# Patient Record
Sex: Female | Born: 1950 | Race: Black or African American | Hispanic: No | Smoking: Former smoker
Health system: Southern US, Community
[De-identification: ages and names within clinical notes are randomized; demographics above are authoritative.]

## PROBLEM LIST (undated history)

## (undated) DIAGNOSIS — E119 Type 2 diabetes mellitus without complications: Secondary | ICD-10-CM

## (undated) DIAGNOSIS — F32A Depression, unspecified: Secondary | ICD-10-CM

## (undated) DIAGNOSIS — R7303 Prediabetes: Secondary | ICD-10-CM

## (undated) DIAGNOSIS — K219 Gastro-esophageal reflux disease without esophagitis: Secondary | ICD-10-CM

## (undated) DIAGNOSIS — I10 Essential (primary) hypertension: Secondary | ICD-10-CM

## (undated) DIAGNOSIS — S83249A Other tear of medial meniscus, current injury, unspecified knee, initial encounter: Secondary | ICD-10-CM

## (undated) DIAGNOSIS — E669 Obesity, unspecified: Secondary | ICD-10-CM

## (undated) DIAGNOSIS — J45909 Unspecified asthma, uncomplicated: Secondary | ICD-10-CM

## (undated) DIAGNOSIS — K429 Umbilical hernia without obstruction or gangrene: Secondary | ICD-10-CM

## (undated) DIAGNOSIS — C801 Malignant (primary) neoplasm, unspecified: Secondary | ICD-10-CM

## (undated) DIAGNOSIS — E785 Hyperlipidemia, unspecified: Secondary | ICD-10-CM

## (undated) DIAGNOSIS — M545 Low back pain, unspecified: Secondary | ICD-10-CM

## (undated) DIAGNOSIS — R300 Dysuria: Secondary | ICD-10-CM

## (undated) DIAGNOSIS — M21379 Foot drop, unspecified foot: Secondary | ICD-10-CM

## (undated) DIAGNOSIS — M47812 Spondylosis without myelopathy or radiculopathy, cervical region: Secondary | ICD-10-CM

## (undated) DIAGNOSIS — F329 Major depressive disorder, single episode, unspecified: Secondary | ICD-10-CM

## (undated) DIAGNOSIS — J449 Chronic obstructive pulmonary disease, unspecified: Secondary | ICD-10-CM

## (undated) DIAGNOSIS — F419 Anxiety disorder, unspecified: Secondary | ICD-10-CM

## (undated) DIAGNOSIS — R011 Cardiac murmur, unspecified: Secondary | ICD-10-CM

## (undated) HISTORY — DX: Hyperlipidemia, unspecified: E78.5

## (undated) HISTORY — DX: Major depressive disorder, single episode, unspecified: F32.9

## (undated) HISTORY — PX: CHOLECYSTECTOMY: SHX55

## (undated) HISTORY — PX: ABDOMINAL HYSTERECTOMY: SHX81

## (undated) HISTORY — DX: Low back pain: M54.5

## (undated) HISTORY — DX: Spondylosis without myelopathy or radiculopathy, cervical region: M47.812

## (undated) HISTORY — DX: Foot drop, unspecified foot: M21.379

## (undated) HISTORY — PX: FOOT SURGERY: SHX648

## (undated) HISTORY — DX: Type 2 diabetes mellitus without complications: E11.9

## (undated) HISTORY — DX: Umbilical hernia without obstruction or gangrene: K42.9

## (undated) HISTORY — DX: Low back pain, unspecified: M54.50

## (undated) HISTORY — DX: Obesity, unspecified: E66.9

## (undated) HISTORY — DX: Dysuria: R30.0

## (undated) HISTORY — PX: COLONOSCOPY: SHX174

## (undated) HISTORY — DX: Depression, unspecified: F32.A

## (undated) HISTORY — DX: Essential (primary) hypertension: I10

## (undated) MED FILL — Dexamethasone Sodium Phosphate Inj 100 MG/10ML: INTRAMUSCULAR | Qty: 1 | Status: AC

---

## 1997-01-16 HISTORY — PX: HERNIA REPAIR: SHX51

## 1999-07-21 ENCOUNTER — Ambulatory Visit (HOSPITAL_COMMUNITY): Admission: RE | Admit: 1999-07-21 | Discharge: 1999-07-21 | Payer: Self-pay

## 1999-08-16 ENCOUNTER — Encounter: Admission: RE | Admit: 1999-08-16 | Discharge: 1999-10-20 | Payer: Self-pay | Admitting: Orthopedic Surgery

## 1999-10-25 ENCOUNTER — Encounter: Admission: RE | Admit: 1999-10-25 | Discharge: 1999-12-26 | Payer: Self-pay | Admitting: Orthopedic Surgery

## 2002-10-24 ENCOUNTER — Encounter: Admission: RE | Admit: 2002-10-24 | Discharge: 2002-10-24 | Payer: Self-pay | Admitting: Internal Medicine

## 2002-10-31 ENCOUNTER — Encounter: Admission: RE | Admit: 2002-10-31 | Discharge: 2002-10-31 | Payer: Self-pay | Admitting: Internal Medicine

## 2002-12-05 ENCOUNTER — Encounter: Admission: RE | Admit: 2002-12-05 | Discharge: 2002-12-05 | Payer: Self-pay | Admitting: Internal Medicine

## 2003-01-15 ENCOUNTER — Encounter: Admission: RE | Admit: 2003-01-15 | Discharge: 2003-01-15 | Payer: Self-pay | Admitting: Internal Medicine

## 2003-01-30 ENCOUNTER — Encounter: Admission: RE | Admit: 2003-01-30 | Discharge: 2003-01-30 | Payer: Self-pay | Admitting: Internal Medicine

## 2003-06-26 ENCOUNTER — Encounter: Admission: RE | Admit: 2003-06-26 | Discharge: 2003-06-26 | Payer: Self-pay | Admitting: Internal Medicine

## 2003-10-30 ENCOUNTER — Ambulatory Visit: Payer: Self-pay | Admitting: Internal Medicine

## 2004-04-22 ENCOUNTER — Ambulatory Visit: Payer: Self-pay | Admitting: Internal Medicine

## 2004-05-19 ENCOUNTER — Ambulatory Visit: Payer: Self-pay | Admitting: Internal Medicine

## 2004-07-16 ENCOUNTER — Emergency Department (HOSPITAL_COMMUNITY): Admission: EM | Admit: 2004-07-16 | Discharge: 2004-07-16 | Payer: Self-pay | Admitting: Emergency Medicine

## 2004-12-05 ENCOUNTER — Ambulatory Visit: Payer: Self-pay | Admitting: Internal Medicine

## 2005-03-08 ENCOUNTER — Ambulatory Visit: Payer: Self-pay | Admitting: Internal Medicine

## 2005-06-16 ENCOUNTER — Ambulatory Visit (HOSPITAL_COMMUNITY): Admission: RE | Admit: 2005-06-16 | Discharge: 2005-06-16 | Payer: Self-pay | Admitting: *Deleted

## 2005-09-21 ENCOUNTER — Ambulatory Visit: Payer: Self-pay | Admitting: Hospitalist

## 2005-09-24 ENCOUNTER — Emergency Department (HOSPITAL_COMMUNITY): Admission: EM | Admit: 2005-09-24 | Discharge: 2005-09-24 | Payer: Self-pay | Admitting: Emergency Medicine

## 2005-12-06 DIAGNOSIS — K5900 Constipation, unspecified: Secondary | ICD-10-CM | POA: Insufficient documentation

## 2005-12-06 DIAGNOSIS — M216X9 Other acquired deformities of unspecified foot: Secondary | ICD-10-CM | POA: Insufficient documentation

## 2005-12-06 DIAGNOSIS — I10 Essential (primary) hypertension: Secondary | ICD-10-CM | POA: Insufficient documentation

## 2005-12-06 DIAGNOSIS — M545 Low back pain, unspecified: Secondary | ICD-10-CM | POA: Insufficient documentation

## 2005-12-06 DIAGNOSIS — H6690 Otitis media, unspecified, unspecified ear: Secondary | ICD-10-CM | POA: Insufficient documentation

## 2005-12-06 DIAGNOSIS — E66812 Obesity, class 2: Secondary | ICD-10-CM | POA: Insufficient documentation

## 2005-12-06 DIAGNOSIS — K469 Unspecified abdominal hernia without obstruction or gangrene: Secondary | ICD-10-CM | POA: Insufficient documentation

## 2005-12-06 DIAGNOSIS — M47812 Spondylosis without myelopathy or radiculopathy, cervical region: Secondary | ICD-10-CM | POA: Insufficient documentation

## 2005-12-06 DIAGNOSIS — E669 Obesity, unspecified: Secondary | ICD-10-CM | POA: Insufficient documentation

## 2005-12-06 DIAGNOSIS — F325 Major depressive disorder, single episode, in full remission: Secondary | ICD-10-CM | POA: Insufficient documentation

## 2005-12-06 DIAGNOSIS — M549 Dorsalgia, unspecified: Secondary | ICD-10-CM | POA: Insufficient documentation

## 2005-12-06 DIAGNOSIS — E785 Hyperlipidemia, unspecified: Secondary | ICD-10-CM | POA: Insufficient documentation

## 2005-12-06 DIAGNOSIS — E119 Type 2 diabetes mellitus without complications: Secondary | ICD-10-CM | POA: Insufficient documentation

## 2006-02-16 ENCOUNTER — Ambulatory Visit: Payer: Self-pay | Admitting: Internal Medicine

## 2006-02-16 LAB — CONVERTED CEMR LAB
Glucose, Bld: 98 mg/dL
Hgb A1c MFr Bld: 6 %

## 2006-02-21 ENCOUNTER — Emergency Department (HOSPITAL_COMMUNITY): Admission: EM | Admit: 2006-02-21 | Discharge: 2006-02-21 | Payer: Self-pay | Admitting: Emergency Medicine

## 2006-02-26 ENCOUNTER — Encounter (INDEPENDENT_AMBULATORY_CARE_PROVIDER_SITE_OTHER): Payer: Self-pay | Admitting: *Deleted

## 2006-02-26 ENCOUNTER — Ambulatory Visit: Payer: Self-pay | Admitting: Hospitalist

## 2006-03-05 ENCOUNTER — Ambulatory Visit (HOSPITAL_COMMUNITY): Admission: RE | Admit: 2006-03-05 | Discharge: 2006-03-05 | Payer: Self-pay | Admitting: *Deleted

## 2006-03-05 ENCOUNTER — Ambulatory Visit: Payer: Self-pay | Admitting: Hospitalist

## 2006-03-07 ENCOUNTER — Telehealth: Payer: Self-pay | Admitting: *Deleted

## 2006-03-15 ENCOUNTER — Encounter: Admission: RE | Admit: 2006-03-15 | Discharge: 2006-05-09 | Payer: Self-pay | Admitting: Hospitalist

## 2006-03-16 ENCOUNTER — Telehealth: Payer: Self-pay | Admitting: *Deleted

## 2006-03-19 ENCOUNTER — Encounter (INDEPENDENT_AMBULATORY_CARE_PROVIDER_SITE_OTHER): Payer: Self-pay | Admitting: *Deleted

## 2006-03-20 ENCOUNTER — Telehealth: Payer: Self-pay | Admitting: *Deleted

## 2006-03-23 ENCOUNTER — Encounter (INDEPENDENT_AMBULATORY_CARE_PROVIDER_SITE_OTHER): Payer: Self-pay | Admitting: *Deleted

## 2006-03-23 ENCOUNTER — Ambulatory Visit: Payer: Self-pay | Admitting: Internal Medicine

## 2006-03-23 LAB — CONVERTED CEMR LAB
Cholesterol: 155 mg/dL (ref 0–200)
HDL: 48 mg/dL (ref >39)
LDL Cholesterol: 93 mg/dL (ref 0–99)
Total CHOL/HDL Ratio: 3.2
Triglycerides: 68 mg/dL (ref <150)
VLDL: 14 mg/dL (ref 0–40)

## 2006-04-03 ENCOUNTER — Telehealth: Payer: Self-pay | Admitting: *Deleted

## 2006-04-16 ENCOUNTER — Telehealth: Payer: Self-pay | Admitting: *Deleted

## 2006-04-19 ENCOUNTER — Telehealth: Payer: Self-pay | Admitting: *Deleted

## 2006-04-27 ENCOUNTER — Ambulatory Visit: Payer: Self-pay | Admitting: Internal Medicine

## 2006-04-27 ENCOUNTER — Encounter (INDEPENDENT_AMBULATORY_CARE_PROVIDER_SITE_OTHER): Payer: Self-pay | Admitting: *Deleted

## 2006-04-27 LAB — CONVERTED CEMR LAB
ALT: 20 units/L (ref 0–35)
AST: 23 units/L (ref 0–37)
Albumin: 4.2 g/dL (ref 3.5–5.2)
Alkaline Phosphatase: 85 units/L (ref 39–117)
BUN: 18 mg/dL (ref 6–23)
CO2: 27 meq/L (ref 19–32)
Calcium: 9.8 mg/dL (ref 8.4–10.5)
Chloride: 101 meq/L (ref 96–112)
Creatinine, Ser: 1 mg/dL (ref 0.40–1.20)
Glucose, Bld: 104 mg/dL — ABNORMAL HIGH (ref 70–99)
Potassium: 4.2 meq/L (ref 3.5–5.3)
Sodium: 141 meq/L (ref 135–145)
Total Bilirubin: 0.4 mg/dL (ref 0.3–1.2)
Total Protein: 7.6 g/dL (ref 6.0–8.3)

## 2006-05-10 ENCOUNTER — Telehealth: Payer: Self-pay | Admitting: *Deleted

## 2006-06-04 ENCOUNTER — Telehealth (INDEPENDENT_AMBULATORY_CARE_PROVIDER_SITE_OTHER): Payer: Self-pay | Admitting: *Deleted

## 2006-07-30 ENCOUNTER — Telehealth (INDEPENDENT_AMBULATORY_CARE_PROVIDER_SITE_OTHER): Payer: Self-pay | Admitting: *Deleted

## 2006-07-31 ENCOUNTER — Telehealth (INDEPENDENT_AMBULATORY_CARE_PROVIDER_SITE_OTHER): Payer: Self-pay | Admitting: *Deleted

## 2006-08-31 ENCOUNTER — Encounter (INDEPENDENT_AMBULATORY_CARE_PROVIDER_SITE_OTHER): Payer: Self-pay | Admitting: *Deleted

## 2006-08-31 ENCOUNTER — Ambulatory Visit: Payer: Self-pay | Admitting: Infectious Disease

## 2006-08-31 LAB — CONVERTED CEMR LAB
ALT: 11 units/L (ref 0–35)
AST: 16 units/L (ref 0–37)
Albumin: 4.3 g/dL (ref 3.5–5.2)
Alkaline Phosphatase: 72 units/L (ref 39–117)
BUN: 11 mg/dL (ref 6–23)
Blood Glucose, Fingerstick: 87
CO2: 27 meq/L (ref 19–32)
Calcium: 10.2 mg/dL (ref 8.4–10.5)
Chloride: 105 meq/L (ref 96–112)
Creatinine, Ser: 0.86 mg/dL (ref 0.40–1.20)
Glucose, Bld: 94 mg/dL (ref 70–99)
Hgb A1c MFr Bld: 6 %
Potassium: 4.3 meq/L (ref 3.5–5.3)
Sodium: 146 meq/L — ABNORMAL HIGH (ref 135–145)
Total Bilirubin: 0.5 mg/dL (ref 0.3–1.2)
Total Protein: 7.4 g/dL (ref 6.0–8.3)
Valproic Acid Lvl: 54.8 ug/mL (ref 50.0–100.0)

## 2006-10-02 ENCOUNTER — Telehealth: Payer: Self-pay | Admitting: *Deleted

## 2006-10-04 ENCOUNTER — Encounter (INDEPENDENT_AMBULATORY_CARE_PROVIDER_SITE_OTHER): Payer: Self-pay | Admitting: *Deleted

## 2006-11-12 ENCOUNTER — Telehealth: Payer: Self-pay | Admitting: *Deleted

## 2007-01-16 ENCOUNTER — Encounter (INDEPENDENT_AMBULATORY_CARE_PROVIDER_SITE_OTHER): Payer: Self-pay | Admitting: *Deleted

## 2007-01-16 ENCOUNTER — Ambulatory Visit: Payer: Self-pay | Admitting: Hospitalist

## 2007-01-16 LAB — CONVERTED CEMR LAB
Blood Glucose, Fingerstick: 124
Hgb A1c MFr Bld: 6.2 %

## 2007-01-18 LAB — CONVERTED CEMR LAB
Cholesterol: 153 mg/dL (ref 0–200)
HDL: 48 mg/dL (ref >39)
LDL Cholesterol: 92 mg/dL (ref 0–99)
Total CHOL/HDL Ratio: 3.2
Triglycerides: 63 mg/dL (ref <150)
VLDL: 13 mg/dL (ref 0–40)

## 2007-03-15 ENCOUNTER — Ambulatory Visit: Payer: Self-pay | Admitting: Internal Medicine

## 2007-03-29 ENCOUNTER — Encounter: Payer: Self-pay | Admitting: Internal Medicine

## 2007-03-29 ENCOUNTER — Ambulatory Visit: Payer: Self-pay | Admitting: Internal Medicine

## 2007-03-29 ENCOUNTER — Encounter (INDEPENDENT_AMBULATORY_CARE_PROVIDER_SITE_OTHER): Payer: Self-pay | Admitting: *Deleted

## 2007-03-29 LAB — HM COLONOSCOPY

## 2007-06-11 ENCOUNTER — Encounter: Admission: RE | Admit: 2007-06-11 | Discharge: 2007-06-11 | Payer: Self-pay | Admitting: Sports Medicine

## 2007-06-21 ENCOUNTER — Ambulatory Visit: Payer: Self-pay | Admitting: Internal Medicine

## 2007-06-21 LAB — CONVERTED CEMR LAB
Blood Glucose, Fingerstick: 86
Hgb A1c MFr Bld: 6.4 %

## 2007-09-04 ENCOUNTER — Telehealth (INDEPENDENT_AMBULATORY_CARE_PROVIDER_SITE_OTHER): Payer: Self-pay | Admitting: *Deleted

## 2007-09-09 ENCOUNTER — Encounter (INDEPENDENT_AMBULATORY_CARE_PROVIDER_SITE_OTHER): Payer: Self-pay | Admitting: *Deleted

## 2007-09-21 ENCOUNTER — Emergency Department (HOSPITAL_COMMUNITY): Admission: EM | Admit: 2007-09-21 | Discharge: 2007-09-21 | Payer: Self-pay | Admitting: Emergency Medicine

## 2007-09-23 ENCOUNTER — Encounter (INDEPENDENT_AMBULATORY_CARE_PROVIDER_SITE_OTHER): Payer: Self-pay | Admitting: *Deleted

## 2007-09-24 ENCOUNTER — Ambulatory Visit: Payer: Self-pay | Admitting: *Deleted

## 2007-10-18 ENCOUNTER — Encounter: Admission: RE | Admit: 2007-10-18 | Discharge: 2007-10-18 | Payer: Self-pay | Admitting: Orthopaedic Surgery

## 2007-10-31 ENCOUNTER — Encounter (INDEPENDENT_AMBULATORY_CARE_PROVIDER_SITE_OTHER): Payer: Self-pay | Admitting: *Deleted

## 2008-02-10 ENCOUNTER — Ambulatory Visit: Payer: Self-pay | Admitting: Infectious Disease

## 2008-02-10 ENCOUNTER — Encounter (INDEPENDENT_AMBULATORY_CARE_PROVIDER_SITE_OTHER): Payer: Self-pay | Admitting: *Deleted

## 2008-02-10 LAB — CONVERTED CEMR LAB
ALT: 12 units/L (ref 0–35)
AST: 18 units/L (ref 0–37)
Albumin: 4.1 g/dL (ref 3.5–5.2)
Alkaline Phosphatase: 65 units/L (ref 39–117)
BUN: 15 mg/dL (ref 6–23)
Blood Glucose, Fingerstick: 82
CO2: 25 meq/L (ref 19–32)
Calcium: 9.7 mg/dL (ref 8.4–10.5)
Chloride: 104 meq/L (ref 96–112)
Cholesterol: 147 mg/dL (ref 0–200)
Creatinine, Ser: 0.84 mg/dL (ref 0.40–1.20)
Glucose, Bld: 84 mg/dL (ref 70–99)
HDL: 47 mg/dL (ref >39)
LDL Cholesterol: 88 mg/dL (ref 0–99)
Potassium: 4.1 meq/L (ref 3.5–5.3)
Sodium: 142 meq/L (ref 135–145)
Total Bilirubin: 0.3 mg/dL (ref 0.3–1.2)
Total CHOL/HDL Ratio: 3.1
Total Protein: 7.1 g/dL (ref 6.0–8.3)
Triglycerides: 60 mg/dL (ref <150)
VLDL: 12 mg/dL (ref 0–40)

## 2008-02-26 ENCOUNTER — Encounter (INDEPENDENT_AMBULATORY_CARE_PROVIDER_SITE_OTHER): Payer: Self-pay | Admitting: *Deleted

## 2008-03-05 LAB — HM MAMMOGRAPHY

## 2008-04-25 ENCOUNTER — Emergency Department (HOSPITAL_COMMUNITY): Admission: EM | Admit: 2008-04-25 | Discharge: 2008-04-25 | Payer: Self-pay | Admitting: Emergency Medicine

## 2008-05-01 ENCOUNTER — Ambulatory Visit: Payer: Self-pay | Admitting: Internal Medicine

## 2008-05-01 DIAGNOSIS — J309 Allergic rhinitis, unspecified: Secondary | ICD-10-CM | POA: Insufficient documentation

## 2008-05-10 ENCOUNTER — Emergency Department (HOSPITAL_COMMUNITY): Admission: EM | Admit: 2008-05-10 | Discharge: 2008-05-10 | Payer: Self-pay | Admitting: Emergency Medicine

## 2008-05-12 ENCOUNTER — Ambulatory Visit: Payer: Self-pay | Admitting: Internal Medicine

## 2008-05-12 ENCOUNTER — Encounter (INDEPENDENT_AMBULATORY_CARE_PROVIDER_SITE_OTHER): Payer: Self-pay | Admitting: Internal Medicine

## 2008-05-12 ENCOUNTER — Encounter (INDEPENDENT_AMBULATORY_CARE_PROVIDER_SITE_OTHER): Payer: Self-pay | Admitting: *Deleted

## 2008-05-12 ENCOUNTER — Ambulatory Visit (HOSPITAL_COMMUNITY): Admission: RE | Admit: 2008-05-12 | Discharge: 2008-05-12 | Payer: Self-pay | Admitting: Internal Medicine

## 2008-05-12 DIAGNOSIS — R5383 Other fatigue: Secondary | ICD-10-CM | POA: Insufficient documentation

## 2008-05-12 DIAGNOSIS — R5381 Other malaise: Secondary | ICD-10-CM | POA: Insufficient documentation

## 2008-05-12 LAB — CONVERTED CEMR LAB
Blood Glucose, Fingerstick: 154
Hgb A1c MFr Bld: 6.3 %

## 2008-06-30 ENCOUNTER — Ambulatory Visit: Payer: Self-pay | Admitting: Internal Medicine

## 2008-06-30 DIAGNOSIS — M1990 Unspecified osteoarthritis, unspecified site: Secondary | ICD-10-CM | POA: Insufficient documentation

## 2008-07-03 ENCOUNTER — Ambulatory Visit (HOSPITAL_COMMUNITY): Admission: RE | Admit: 2008-07-03 | Discharge: 2008-07-03 | Payer: Self-pay | Admitting: Internal Medicine

## 2008-09-18 ENCOUNTER — Ambulatory Visit: Payer: Self-pay | Admitting: Internal Medicine

## 2008-09-18 LAB — CONVERTED CEMR LAB
ALT: 15 units/L (ref 0–35)
AST: 19 units/L (ref 0–37)
Albumin: 4.1 g/dL (ref 3.5–5.2)
Alkaline Phosphatase: 70 units/L (ref 39–117)
BUN: 18 mg/dL (ref 6–23)
Blood Glucose, Fingerstick: 98
Blood in Urine, dipstick: NEGATIVE
CO2: 26 meq/L (ref 19–32)
Calcium: 9.6 mg/dL (ref 8.4–10.5)
Chloride: 104 meq/L (ref 96–112)
Creatinine, Ser: 1.13 mg/dL (ref 0.40–1.20)
Creatinine, Urine: 189.3 mg/dL
Glucose, Bld: 95 mg/dL (ref 70–99)
Glucose, Urine, Semiquant: NEGATIVE
Hgb A1c MFr Bld: 6.3 %
Ketones, urine, test strip: NEGATIVE
Microalb Creat Ratio: 4.8 mg/g (ref 0.0–30.0)
Microalb, Ur: 0.91 mg/dL (ref 0.00–1.89)
Nitrite: NEGATIVE
Potassium: 3.8 meq/L (ref 3.5–5.3)
Protein, U semiquant: NEGATIVE
Sodium: 142 meq/L (ref 135–145)
Specific Gravity, Urine: 1.025
Total Bilirubin: 0.4 mg/dL (ref 0.3–1.2)
Total Protein: 7.1 g/dL (ref 6.0–8.3)
Urobilinogen, UA: 1
WBC Urine, dipstick: NEGATIVE
pH: 6.5

## 2008-09-30 ENCOUNTER — Ambulatory Visit: Payer: Self-pay | Admitting: Sports Medicine

## 2008-09-30 DIAGNOSIS — M25569 Pain in unspecified knee: Secondary | ICD-10-CM | POA: Insufficient documentation

## 2009-01-18 ENCOUNTER — Telehealth (INDEPENDENT_AMBULATORY_CARE_PROVIDER_SITE_OTHER): Payer: Self-pay | Admitting: *Deleted

## 2009-02-24 ENCOUNTER — Telehealth: Payer: Self-pay | Admitting: Internal Medicine

## 2009-03-09 ENCOUNTER — Ambulatory Visit: Payer: Self-pay | Admitting: Internal Medicine

## 2009-03-09 DIAGNOSIS — N3090 Cystitis, unspecified without hematuria: Secondary | ICD-10-CM | POA: Insufficient documentation

## 2009-03-09 LAB — HM DIABETES FOOT EXAM

## 2009-03-09 LAB — CONVERTED CEMR LAB
ALT: 8 units/L (ref 0–35)
AST: 15 units/L (ref 0–37)
Albumin: 4.2 g/dL (ref 3.5–5.2)
Alkaline Phosphatase: 74 units/L (ref 39–117)
BUN: 12 mg/dL (ref 6–23)
Blood Glucose, Fingerstick: 89
CO2: 29 meq/L (ref 19–32)
Calcium: 9.6 mg/dL (ref 8.4–10.5)
Chloride: 100 meq/L (ref 96–112)
Cholesterol: 150 mg/dL (ref 0–200)
Creatinine, Ser: 0.92 mg/dL (ref 0.40–1.20)
Glucose, Bld: 86 mg/dL (ref 70–99)
HDL: 52 mg/dL (ref >39)
Hgb A1c MFr Bld: 6.1 %
LDL Cholesterol: 88 mg/dL (ref 0–99)
Potassium: 4.1 meq/L (ref 3.5–5.3)
Sodium: 139 meq/L (ref 135–145)
Total Bilirubin: 0.4 mg/dL (ref 0.3–1.2)
Total CHOL/HDL Ratio: 2.9
Total Protein: 7.4 g/dL (ref 6.0–8.3)
Triglycerides: 50 mg/dL (ref <150)
VLDL: 10 mg/dL (ref 0–40)

## 2009-03-31 ENCOUNTER — Encounter: Payer: Self-pay | Admitting: Internal Medicine

## 2009-10-22 ENCOUNTER — Ambulatory Visit: Payer: Self-pay | Admitting: Internal Medicine

## 2009-10-22 LAB — CONVERTED CEMR LAB
ALT: 13 units/L (ref 0–35)
AST: 17 units/L (ref 0–37)
Albumin: 4.4 g/dL (ref 3.5–5.2)
Alkaline Phosphatase: 86 units/L (ref 39–117)
BUN: 19 mg/dL (ref 6–23)
Blood Glucose, AC Bkfst: 111 mg/dL
CO2: 29 meq/L (ref 19–32)
Calcium: 9.8 mg/dL (ref 8.4–10.5)
Chloride: 99 meq/L (ref 96–112)
Cholesterol: 168 mg/dL (ref 0–200)
Creatinine, Ser: 0.98 mg/dL (ref 0.40–1.20)
Glucose, Bld: 109 mg/dL — ABNORMAL HIGH (ref 70–99)
HCT: 40.5 % (ref 36.0–46.0)
HDL: 48 mg/dL (ref >39)
Hemoglobin: 13.2 g/dL (ref 12.0–15.0)
Hgb A1c MFr Bld: 6.5 %
LDL Cholesterol: 109 mg/dL — ABNORMAL HIGH (ref 0–99)
MCHC: 32.6 g/dL (ref 30.0–36.0)
MCV: 86.7 fL (ref >=78.0)
Platelets: 307 10*3/uL (ref 150–400)
Potassium: 4.2 meq/L (ref 3.5–5.3)
RBC: 4.67 M/uL (ref 3.87–5.11)
RDW: 13 % (ref 11.5–15.5)
Sodium: 138 meq/L (ref 135–145)
TSH: 0.497 microintl units/mL (ref 0.350–4.5)
Total Bilirubin: 0.4 mg/dL (ref 0.3–1.2)
Total CHOL/HDL Ratio: 3.5
Total Protein: 7.4 g/dL (ref 6.0–8.3)
Triglycerides: 53 mg/dL (ref <150)
VLDL: 11 mg/dL (ref 0–40)
WBC: 6.5 10*3/uL (ref 4.0–10.5)

## 2009-12-27 ENCOUNTER — Ambulatory Visit: Payer: Self-pay

## 2009-12-27 ENCOUNTER — Ambulatory Visit: Payer: Self-pay | Admitting: Internal Medicine

## 2009-12-27 DIAGNOSIS — R300 Dysuria: Secondary | ICD-10-CM

## 2009-12-27 HISTORY — DX: Dysuria: R30.0

## 2009-12-27 LAB — CONVERTED CEMR LAB
Bilirubin Urine: NEGATIVE
Blood Glucose, Fingerstick: 115
Blood, UA: NEGATIVE
Ketones, ur: NEGATIVE mg/dL
Leukocytes, UA: NEGATIVE
Nitrite: NEGATIVE
Protein, ur: NEGATIVE mg/dL
Specific Gravity, Urine: 1.024 (ref 1.005–1.03)
Urine Glucose: NEGATIVE mg/dL
Urobilinogen, UA: 0.2 (ref 0.0–1.0)
pH: 5.5 (ref 5.0–8.0)

## 2010-02-03 ENCOUNTER — Ambulatory Visit: Admit: 2010-02-03 | Payer: Self-pay

## 2010-02-06 ENCOUNTER — Encounter: Payer: Self-pay | Admitting: Internal Medicine

## 2010-02-07 ENCOUNTER — Encounter: Payer: Self-pay | Admitting: Orthopaedic Surgery
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
## 2010-02-15 NOTE — Assessment & Plan Note (Signed)
Summary: EST-CK/FU/MEDS/CFB   Vital Signs:  Patient Profile:   61 Years Old Female Height:     61.75 inches (156.85 cm) Weight:      194.2 pounds (88.27 kg) BMI:     35.94 Temp:     97.6 degrees F oral Pulse rate:   71 / minute BP sitting:   110 / 73  (right arm)  Pt. in pain?   yes    Location:   left arm and sinus    Intensity:   7-10  Vitals Entered By: Mariah Kidney Ditzler RN (June 21, 2007 3:28 PM)              Is Patient Diabetic? Yes  Nutritional Status BMI of > 30 = obese Nutritional Status Detail good CBG Result 86  Have you ever been in a relationship where you felt threatened, hurt or afraid?denies   Does patient need assistance? Functional Status Self care Ambulation Normal     PCP:  Olene Craven MD  Chief Complaint:  Ck-up and refills on meds. Handicapped sticker. Recently had labs at 2 diff doctors office.Marland Kitchen  History of Present Illness: Mariah Henderson is a 59 y/o woman with DM-II, HTN, HLPD, major depressive disorder who comes to the opc for a checkup.  Is on Zyrtec but only takes it sporadically. Had a h/a and her eyes were hurting last night. Bothers her especially at that time of the day. Wonders why she still gets sx.  Otherwise, no complaints.  Depression History:      The patient denies significant weight loss, insomnia, psychomotor retardation, fatigue (loss of energy), feelings of worthlessness (guilt), impaired concentration (indecisiveness), and recurrent thoughts of death or suicide.  The patient denies symptoms of a manic disorder including persistently & abnormally elevated mood.        The patient denies that she feels like life is not worth living, denies that she wishes that she were dead, and denies that she has thought about ending her life.         Diabetes Management History:      Self foot exams are being performed.  She is not checking home blood sugars.  She says that she is exercising.        Hypoglycemic symptoms are not occurring.  No  hyperglycemic symptoms are reported.        Prior Medication List:  LOTENSIN 20 MG TABS (BENAZEPRIL HCL) Take 1 tablet by mouth once a day HYDROCHLOROTHIAZIDE 25 MG TABS (HYDROCHLOROTHIAZIDE) Take 1 tablet by mouth once a day ASPIRIN 81 MG TBEC (ASPIRIN) Take 1 tablet by mouth once a day NEURONTIN 300 MG CAPS (GABAPENTIN) Take 1 capsule by mouth at bedtime METAMUCIL  POWD (PSYLLIUM POWD) Use as directed ZOCOR 40 MG TABS (SIMVASTATIN) Take 1 tablet by mouth at bedtime GLUCOPHAGE 500 MG TABS (METFORMIN HCL) One tablet daily CLARITIN-D 24 HOUR 10-240 MG TB24 (LORATADINE-PSEUDOEPHEDRINE) Take 1 tablet by mouth once a day NASAL SALINE 0.65 % SOLN (SALINE) 2 sprays in each nostril 1 to 2 times daily PAXIL 20 MG  TABS (PAROXETINE HCL)    Current Allergies: No known allergies     Risk Factors:  Tobacco use:  quit    Year quit:  1998 Exercise:  yes   Review of Systems  General      Denies fatigue and fever.  CV      Denies chest pain or discomfort, difficulty breathing while lying down, palpitations, and swelling of feet.  Resp  Denies cough and shortness of breath.  GI      Denies abdominal pain, bloody stools, constipation, dark tarry stools, and diarrhea.   Physical Exam  General:     alert, well-developed, well-nourished, and well-hydrated. Elderly woman in NAD. Head:     atraumatic.   Eyes:     vision grossly intact, pupils equal, pupils round, and pupils reactive to light.   Mouth:     OP clear Lungs:     CTAB with good air mvtnormal respiratory effort, no intercostal retractions, and no accessory muscle use.   Heart:     RRR Abdomen:     soft, non-tender, and no masses.   Extremities:     No e/c/c. Neurologic:     alert & oriented X3, cranial nerves II-XII intact, and gait normal.      Impression & Recommendations:  Problem # 1:  HYPERTENSION (ICD-401.9) Well controlled.  Her updated medication list for this problem includes:    Lotensin 20  Mg Tabs (Benazepril hcl) .Marland Kitchen... Take 1 tablet by mouth once a day    Hydrochlorothiazide 25 Mg Tabs (Hydrochlorothiazide) .Marland Kitchen... Take 1 tablet by mouth once a day  BP today: 110/73 Prior BP: 149/88 (01/16/2007)  Prior 10 Yr Risk Heart Disease: 17 % (01/16/2007)  Labs Reviewed: Creat: 0.86 (08/31/2006) Chol: 153 (01/16/2007)   HDL: 48 (01/16/2007)   LDL: 92 (01/16/2007)   TG: 63 (01/16/2007)   Problem # 2:  DIABETES MELLITUS, TYPE II (ICD-250.00) Excellent control on current regimen.  Her updated medication list for this problem includes:    Lotensin 20 Mg Tabs (Benazepril hcl) .Marland Kitchen... Take 1 tablet by mouth once a day    Aspirin 81 Mg Tbec (Aspirin) .Marland Kitchen... Take 1 tablet by mouth once a day    Glucophage 500 Mg Tabs (Metformin hcl) ..... One tablet daily  Orders: T- Capillary Blood Glucose (91478) T-Hgb A1C (in-house) ([id number])  Labs Reviewed: HgBA1c: 6.2 (01/16/2007)   Creat: 0.86 (08/31/2006)     Labs Reviewed: HgBA1c: 6.4 (06/21/2007)   Creat: 0.86 (08/31/2006)      Problem # 3:  HYPERLIPIDEMIA (ICD-272.4) LDL at goal. No myalgias.  Her updated medication list for this problem includes:    Zocor 40 Mg Tabs (Simvastatin) .Marland Kitchen... Take 1 tablet by mouth at bedtime  Labs Reviewed: Chol: 153 (01/16/2007)   HDL: 48 (01/16/2007)   LDL: 92 (01/16/2007)   TG: 63 (01/16/2007) SGOT: 16 (08/31/2006)   SGPT: 11 (08/31/2006)  Prior 10 Yr Risk Heart Disease: 17 % (01/16/2007)   Problem # 4:  OBESITY (ICD-278.00) Encouraged patient to continue her efforts (she hadn't even noticed that she'd lost about 8 lbs since her last visit). [facility]  Problem # 5:  DEPRESSION (ICD-311) Has a f/u apptmt this month at mental health. Doing well. no sucidial/homicidal ideation.  Her updated medication list for this problem includes:    Paxil 20 Mg Tabs (Paroxetine hcl)   Problem # 6:  Preventive Health Care (ICD-V70.0) Had her colonoscopy done and had  polypectomies: benign pathology (Dr. Juanda Chance). Up to date with mammograms.  Problem # 7:  ALLERGIC RHINITIS DUE TO OTHER ALLERGEN (ICD-477.8) Advised her to take the Zyrtec DAILY if she wants the full benefits of the therapy.  Complete Medication List: 1)  Lotensin 20 Mg Tabs (Benazepril hcl) .... Take 1 tablet by mouth once a day 2)  Hydrochlorothiazide 25 Mg Tabs (Hydrochlorothiazide) .... Take 1 tablet by mouth once a day  3)  Aspirin 81 Mg Tbec (Aspirin) .... Take 1 tablet by mouth once a day 4)  Neurontin 300 Mg Caps (Gabapentin) .... Take 1 capsule by mouth at bedtime 5)  Metamucil Powd (Psyllium powd) .... Use as directed 6)  Zocor 40 Mg Tabs (Simvastatin) .... Take 1 tablet by mouth at bedtime 7)  Glucophage 500 Mg Tabs (Metformin hcl) .... One tablet daily 8)  Claritin-d 24 Hour 10-240 Mg Tb24 (Loratadine-pseudoephedrine) .... Take 1 tablet by mouth once a day 9)  Nasal Saline 0.65 % Soln (Saline) .... 2 sprays in each nostril 1 to 2 times daily 10)  Paxil 20 Mg Tabs (Paroxetine hcl)  Diabetes Management Assessment/Plan:      The following lipid goals have been established for the patient: Total cholesterol goal of 200; LDL cholesterol goal of 100; HDL cholesterol goal of 40; Triglyceride goal of 200.     Patient Instructions: 1)  Keep up the good work ! 2)  Please schedule a follow-up appointment as needed.   Prescriptions: GLUCOPHAGE 500 MG TABS (METFORMIN HCL) One tablet daily  #30 x 11   Entered and Authorized by:   Olene Craven MD   Signed by:   Olene Craven MD on 06/21/2007   Method used:   Electronically sent to ...       [facility]  [address] [phone]*       [address]       [place], [place]  [address]       Ph: [phone]       Fax: [phone]   RxID:   [id number] ZOCOR 40 MG TABS (SIMVASTATIN) Take 1 tablet by mouth at bedtime  #30 x 11   Entered and Authorized by:   Olene Craven MD   Signed by:   Olene Craven MD on 06/21/2007   Method  used:   Electronically sent to ...       [facility]  [address] [phone]*       [address]       [place], [place]  [address]       Ph: [phone]       Fax: [phone]   RxID:   [id number] HYDROCHLOROTHIAZIDE 25 MG TABS (HYDROCHLOROTHIAZIDE) Take 1 tablet by mouth once a day  #30 x 11   Entered and Authorized by:   Olene Craven MD   Signed by:   Olene Craven MD on 06/21/2007   Method used:   Electronically sent to ...       [facility]  [address] [phone]*       [address]       [place], [place]  [address]       Ph: [phone]       Fax: [phone]   RxID:   [id number] LOTENSIN 20 MG TABS (BENAZEPRIL HCL) Take 1 tablet by mouth once a day  #30 x 11   Entered and Authorized by:   Olene Craven MD   Signed by:   Olene Craven MD on 06/21/2007   Method used:   Electronically sent to ...       [address] [phone]*       [address]       [place], [place]  [address]       Ph: [phone]       Fax: [phone]   RxID:   [id number]  ]  Vital Signs:  Patient Profile:   60 Years Old Female Height:  61.75 inches (156.85 cm) Weight:      194.2 pounds (88.27 kg) BMI:     35.94 Temp:     97.6 degrees F oral Pulse rate:   71 / minute BP sitting:   110 / 73    Location:   left arm and sinus    Intensity:   7-10             CBG Result 86      Laboratory Results   Blood Tests   Date/Time Received: June 21, 2007 3:55 PM  Date/Time Reported: Oren Beckmann  June 21, 2007 3:55 PM   HGBA1C: 6.4%   (Normal Range: Non-Diabetic - 3-6%   Control Diabetic - 6-8%) CBG Random:: 86mg /dL

## 2010-02-15 NOTE — Consult Note (Signed)
Summary: [facility].: Colonoscopy  [facility].: Colonoscopy   Imported By: Florinda Marker 12/16/2007 14:21:24  _____________________________________________________________________  External Attachment:    Type:   Image     Comment:   External Document  Appended Document: [facility].: Colonoscopy    Clinical Lists Changes  Observations: Added new observation of COLONRECACT: Repeat colonoscopy in 7 years.  (03/29/2007 11:03) Added new observation of COLONOSCOPY:  Results: Several diminutive polyps in rectosigmoid.  Results: Specimen sent for pathology.    Results: Diverticulosis.   Performed by Dr. Juanda Chance.      (03/29/2007 11:03)       Colonoscopy  Procedure date:  03/29/2007  Findings:       Results: Several diminutive polyps in rectosigmoid.  Results: Specimen sent for pathology.    Results: Diverticulosis.   Performed by Dr. Juanda Chance.       Comments:      Repeat colonoscopy in 7 years.

## 2010-02-15 NOTE — Miscellaneous (Signed)
Summary: Orders Update  Clinical Lists Changes  Orders: Added new Test order of T-Lipid Profile [phone]) - Signed Added new Test order of T-Comprehensive Metabolic Panel ([id number]) - Signed Check Cmet, FLP with next visit.

## 2010-02-15 NOTE — Assessment & Plan Note (Signed)
Summary: ACUTE-DIABETES AND BP CHECK ([facility])/CFB   Vital Signs:  Patient profile:   60 year old female Height:      59.5 inches (151.13 cm) Weight:      197.9 pounds (89.95 kg) BMI:     39.44 Temp:     97.9 degrees F oral Pulse rate:   79 / minute BP sitting:   135 / 85  (left arm) Cuff size:   large  Vitals Entered By: Cynda Familia Duncan Dull) (October 22, 2009 4:24 PM) Is Patient Diabetic? Yes Nutritional Status BMI of > 30 = obese  Have you ever been in a relationship where you felt threatened, hurt or afraid?No   Does patient need assistance? Functional Status Self care Ambulation Normal   Primary Care Provider:  Darnelle Maffucci MD   History of Present Illness: 60 yr old woman with pmhx as described below comes to the clinic for follow up. Patient has no complains.   Preventive Screening-Counseling & Management  Alcohol-Tobacco     Smoking Status: quit     Year Quit: 1998  Problems Prior to Update: 1)  Uti  (ICD-599.0) 2)  Knee Pain  (ICD-719.46) 3)  Degenerative Joint Disease  (ICD-715.90) 4)  Weakness  (ICD-780.79) 5)  Allergic Rhinitis  (ICD-477.9) 6)  Aftercare, Long-term Use, Medications Nec  (ICD-V58.69) 7)  Screening Mammogram Nec  (ICD-V76.12) 8)  Diabetes Mellitus, Type II  (ICD-250.00) 9)  Hyperlipidemia  (ICD-272.4) 10)  Hypertension  (ICD-401.9) 11)  Obesity  (ICD-278.00) 12)  Otitis Media, Chronic  (ICD-382.9) 13)  Depression  (ICD-311) 14)  Spondylosis, Cervical  (ICD-721.0) 15)  Low Back Pain  (ICD-724.2) 16)  Constipation Nos  (ICD-564.00) 17)  Hernia  (ICD-553.9) 18)  Foot Drop  (ICD-736.79)  Medications Prior to Update: 1)  Lotensin 20 Mg Tabs (Benazepril Hcl) .... Take 1 Tablet By Mouth Once A Day 2)  Hydrochlorothiazide 25 Mg Tabs (Hydrochlorothiazide) .... Take 1 Tablet By Mouth Once A Day 3)  Aspirin 81 Mg Tbec (Aspirin) .... Take 1 Tablet By Mouth Once A Day 4)  Neurontin 300 Mg Caps (Gabapentin) .... Take 1 Capsule By Mouth At  Bedtime 5)  Simvastatin 40 Mg Tabs (Simvastatin) .... Take 1 Tablet By Mouth At Bedtime. 6)  Metformin Hcl 500 Mg Tabs (Metformin Hcl) .... Take 1 Tablet By Mouth Once A Day 7)  Paxil 20 Mg  Tabs (Paroxetine Hcl) 8)  Nasonex 50 Mcg/act Susp (Mometasone Furoate) .... Spray in Each Nostril Once A Day. 9)  Zyrtec Allergy 10 Mg Tabs (Cetirizine Hcl) .... Take 1 Tablet By Mouth Once A Day 10)  Vitamin D3 1000 Unit Caps (Cholecalciferol) .... Take 2 Tablets By Mouth Daily 11)  Calcium Carbonate 600 Mg Tabs (Calcium Carbonate) .... Take One Tab Twice Daily 12)  Acetaminophen 500 Mg Tabs (Acetaminophen) .... Take One Tab Up To 6 Times Daily For Pain. 13)  Ultram 50 Mg Tabs (Tramadol Hcl) .... Take 1 Tablet By Mouth Three Times A Day As Needed For Pain 14)  Omeprazole 20 Mg Cpdr (Omeprazole) .... Take 1 Tablet By Mouth Once A Day 15)  Ciprofloxacin Hcl 250 Mg Tabs (Ciprofloxacin Hcl) .... Take 1 Tablet By Mouth Twice A Day For 3 Days 16)  Prodigy Blood Glucose Monitor  Devi (Blood Glucose Monitoring Suppl) .... Use As Instructed 17)  Prodigy Blood Glucose Test  Strp (Glucose Blood) .... Use As Instructed  Current Medications (verified): 1)  Lotensin 20 Mg Tabs (Benazepril Hcl) .... Take 1 Tablet By  Mouth Once A Day 2)  Hydrochlorothiazide 25 Mg Tabs (Hydrochlorothiazide) .... Take 1 Tablet By Mouth Once A Day 3)  Aspirin 81 Mg Tbec (Aspirin) .... Take 1 Tablet By Mouth Once A Day 4)  Neurontin 300 Mg Caps (Gabapentin) .... Take 1 Capsule By Mouth At Bedtime 5)  Simvastatin 40 Mg Tabs (Simvastatin) .... Take 1 Tablet By Mouth At Bedtime. 6)  Metformin Hcl 500 Mg Tabs (Metformin Hcl) .... Take 1 Tablet By Mouth Once A Day 7)  Paxil 20 Mg  Tabs (Paroxetine Hcl) 8)  Nasonex 50 Mcg/act Susp (Mometasone Furoate) .... Spray in Each Nostril Once A Day. 9)  Zyrtec Allergy 10 Mg Tabs (Cetirizine Hcl) .... Take 1 Tablet By Mouth Once A Day 10)  Vitamin D3 1000 Unit Caps (Cholecalciferol) .... Take 2 Tablets By  Mouth Daily 11)  Calcium Carbonate 600 Mg Tabs (Calcium Carbonate) .... Take One Tab Twice Daily 12)  Acetaminophen 500 Mg Tabs (Acetaminophen) .... Take One Tab Up To 6 Times Daily For Pain. 13)  Ultram 50 Mg Tabs (Tramadol Hcl) .... Take 1 Tablet By Mouth Three Times A Day As Needed For Pain 14)  Omeprazole 20 Mg Cpdr (Omeprazole) .... Take 1 Tablet By Mouth Once A Day 15)  Prodigy Blood Glucose Monitor  Devi (Blood Glucose Monitoring Suppl) .... Use As Instructed 16)  Prodigy Blood Glucose Test  Strp (Glucose Blood) .... Use As Instructed  Allergies: No Known Drug Allergies  Past History:  Past Medical History: Last updated: 01/16/2007 Diabetes mellitus, type II Hyperlipidemia Hypertension Low back pain Cervical spondylosis Obesity Depression    - followed by Dr. Allyne Gee    - no longer on Depakote, Seroquel Chronic serous otitis media Foot drop Constipation Paraumbulical hernia Hernia MVI 02/20/06  Past Surgical History: Last updated: 12/05/2005 Cholecystectomy S/P hernia repair 1999  Family History: Last updated: 01/16/2007 No colon CA.  Social History: Last updated: 02/16/2006 Used to smoke in past quit 1998 Lives alone in [place]  Risk Factors: Exercise: yes (03/09/2009)  Risk Factors: Smoking Status: quit (10/22/2009)  Family History: Reviewed history from 01/16/2007 and no changes required. No colon CA.  Social History: Reviewed history from 02/16/2006 and no changes required. Used to smoke in past quit 1998 Lives alone in [place]  Review of Systems  The patient denies fever, chest pain, dyspnea on exertion, hemoptysis, abdominal pain, melena, hematochezia, hematuria, muscle weakness, and difficulty walking.    Physical Exam  General:  alert, well-developed, and cooperative to examination.    Mouth:   pharynx pink and moist, no erythema, and no exudates.    Neck:  supple, full ROM, no thyromegaly, no JVD, and no carotid bruits.    Lungs:   normal respiratory effort, no accessory muscle use, normal breath sounds, no crackles, and no wheezes.  Heart:  normal rate, regular rhythm, 2/6 systolic murmur, no gallop, and no rub.    Abdomen:  soft, non-tender, normal bowel sounds, no distention, no guarding, no rebound tenderness, no hepatomegaly, and no splenomegaly.    Msk:  no joint swelling, no joint warmth, and no redness over joints.    Extremities:  .No cyanosis, clubbing, edema  Neurologic:  alert & oriented X3, cranial nerves II-XII intact, strength normal in all extremities, sensation intact to light touch, and gait normal.       Impression & Recommendations:  Problem # 1:  DIABETES MELLITUS, TYPE II (ICD-250.00) Controlled. Contineu current regimen.  Her updated medication list for this problem includes:  Lotensin 20 Mg Tabs (Benazepril hcl) .Marland Kitchen... Take 1 tablet by mouth once a day    Aspirin 81 Mg Tbec (Aspirin) .Marland Kitchen... Take 1 tablet by mouth once a day    Metformin Hcl 500 Mg Tabs (Metformin hcl) .Marland Kitchen... Take 1 tablet by mouth once a day  Orders: T- Capillary Blood Glucose (16109) T-Hgb A1C (in-house) ([id number]) T-CBC No Diff ([id number])  Labs Reviewed: Creat: 0.92 (03/09/2009)    Reviewed HgBA1c results: 6.5 (10/22/2009)  6.1 (03/09/2009)  Problem # 2:  HYPERTENSION (ICD-401.9) Continue current regimen. Consider increasing benazepril on follow up if BP <130/80.   Her updated medication list for this problem includes:    Lotensin 20 Mg Tabs (Benazepril hcl) .Marland Kitchen... Take 1 tablet by mouth once a day    Hydrochlorothiazide 25 Mg Tabs (Hydrochlorothiazide) .Marland Kitchen... Take 1 tablet by mouth once a day  BP today: 135/85 Prior BP: 137/75 (03/09/2009)  Prior 10 Yr Risk Heart Disease: 17 % (01/16/2007)  Labs Reviewed: K+: 4.1 (03/09/2009) Creat: : 0.92 (03/09/2009)   Chol: 150 (03/09/2009)   HDL: 52 (03/09/2009)   LDL: 88 (03/09/2009)   TG: 50 (03/09/2009)  Problem # 3:  HYPERLIPIDEMIA (ICD-272.4) At goal. Continue  current regimen.  Her updated medication list for this problem includes:    Simvastatin 40 Mg Tabs (Simvastatin) .Marland Kitchen... Take 1 tablet by mouth at bedtime.  Orders: T-CMP with Estimated GFR ([id number]) T-Lipid Profile ([id number]) T-TSH [phone])  Labs Reviewed: SGOT: 15 (03/09/2009)   SGPT: 8 (03/09/2009)  Prior 10 Yr Risk Heart Disease: 17 % (01/16/2007)   HDL:52 (03/09/2009), 47 (02/10/2008)  LDL:88 (03/09/2009), 88 (02/10/2008)  Chol:150 (03/09/2009), 147 (02/10/2008)  Trig:50 (03/09/2009), 60 (02/10/2008)  Problem # 4:  DEPRESSION (ICD-311) Stable. Continue current regimen.  Her updated medication list for this problem includes:    Paxil 20 Mg Tabs (Paroxetine hcl)  Problem # 5:  Preventive Health Care (ICD-V70.0) Received flu shot today.  Complete Medication List: 1)  Lotensin 20 Mg Tabs (Benazepril hcl) .... Take 1 tablet by mouth once a day 2)  Hydrochlorothiazide 25 Mg Tabs (Hydrochlorothiazide) .... Take 1 tablet by mouth once a day 3)  Aspirin 81 Mg Tbec (Aspirin) .... Take 1 tablet by mouth once a day 4)  Neurontin 300 Mg Caps (Gabapentin) .... Take 1 capsule by mouth at bedtime 5)  Simvastatin 40 Mg Tabs (Simvastatin) .... Take 1 tablet by mouth at bedtime. 6)  Metformin Hcl 500 Mg Tabs (Metformin hcl) .... Take 1 tablet by mouth once a day 7)  Paxil 20 Mg Tabs (Paroxetine hcl) 8)  Nasonex 50 Mcg/act Susp (Mometasone furoate) .... Spray in each nostril once a day. 9)  Zyrtec Allergy 10 Mg Tabs (Cetirizine hcl) .... Take 1 tablet by mouth once a day 10)  Vitamin D3 1000 Unit Caps (Cholecalciferol) .... Take 2 tablets by mouth daily 11)  Calcium Carbonate 600 Mg Tabs (Calcium carbonate) .... Take one tab twice daily 12)  Acetaminophen 500 Mg Tabs (Acetaminophen) .... Take one tab up to 6 times daily for pain. 13)  Ultram 50 Mg Tabs (Tramadol hcl) .... Take 1 tablet by mouth three times a day as needed for pain 14)  Omeprazole 20 Mg Cpdr (Omeprazole) .... Take 1  tablet by mouth once a day 15)  Prodigy Blood Glucose Monitor Devi (Blood glucose monitoring suppl) .... Use as instructed 16)  Prodigy Blood Glucose Test Strp (Glucose blood) .... Use as instructed  Other Orders: Influenza Vaccine MCR ([id number])  Patient Instructions: 1)  Please  schedule a follow-up appointment in 3 months. 2)  Take all medication as directed. 3)  You will be called with any abnormalities in the tests scheduled or performed today.  If you don't hear from [place] within a week from when the test was performed, you can assume that your test was normal.   Prevention & Chronic Care Immunizations   Influenza vaccine: Fluvax MCR  (10/22/2009)    Tetanus booster: 03/05/2006: Td    Pneumococcal vaccine: Not documented    H. zoster vaccine: Not documented  Colorectal Screening   Hemoccult: Not documented   Hemoccult action/deferral: Deferred  (03/09/2009)    Colonoscopy:  Results: Several diminutive polyps in rectosigmoid.  Results: Specimen sent for pathology.    Results: Diverticulosis.   Performed by Dr. Juanda Chance.       (03/29/2007)   Colonoscopy action/deferral: Repeat colonoscopy in 7 years.   (03/29/2007)  Other Screening   Pap smear: Not documented   Pap smear action/deferral: Deferred  (03/09/2009)    Mammogram: No specific mammographic evidence of malignancy.  No significant changes compared to previous study.  Assessment: BIRADS 2. Location: [facility].    (03/05/2008)   Mammogram action/deferral: Ordered  (03/09/2009)    DXA bone density scan: Not documented   Smoking status: quit  (10/22/2009)  Diabetes Mellitus   HgbA1C: 6.5  (10/22/2009)   HgbA1C action/deferral: Ordered  (03/09/2009)    Eye exam: Not documented   Diabetic eye exam action/deferral: Ophthalmology referral  (09/18/2008)    Foot exam: yes  (03/09/2009)   Foot exam action/deferral: Do today   High risk foot: Not documented   Foot care education: Done   (03/09/2009)    Urine microalbumin/creatinine ratio: 4.8  (09/18/2008)   Urine microalbumin action/deferral: Ordered    Diabetes flowsheet reviewed?: Yes   Progress toward A1C goal: At goal  Lipids   Total Cholesterol: 150  (03/09/2009)   Lipid panel action/deferral: Lipid Panel ordered   LDL: 88  (03/09/2009)   LDL Direct: Not documented   HDL: 52  (03/09/2009)   Triglycerides: 50  (03/09/2009)    SGOT (AST): 15  (03/09/2009)   BMP action: Ordered   SGPT (ALT): 8  (03/09/2009)   Alkaline phosphatase: 74  (03/09/2009)   Total bilirubin: 0.4  (03/09/2009)    Lipid flowsheet reviewed?: Yes   Progress toward LDL goal: At goal  Hypertension   Last Blood Pressure: 135 / 85  (10/22/2009)   Serum creatinine: 0.92  (03/09/2009)   BMP action: Ordered   Serum potassium 4.1  (03/09/2009)    Hypertension flowsheet reviewed?: Yes   Progress toward BP goal: Unchanged  Self-Management Support :   Personal Goals (by the next clinic visit) :     Personal A1C goal: 7  (03/09/2009)     Personal blood pressure goal: 130/80  (03/09/2009)     Personal LDL goal: 100  (03/09/2009)    Patient will work on the following items until the next clinic visit to reach self-care goals:     Medications and monitoring: take my medicines every day, check my blood sugar, examine my feet every day  (10/22/2009)     Eating: eat baked foods instead of fried foods, eat fruit for snacks and desserts  (10/22/2009)     Activity: join a walking program  (03/09/2009)     Other: will start check BS when gets new meter and bring in meds and goes to [facility]  (03/09/2009)    Diabetes self-management  support: Written self-care plan  (10/22/2009)   Diabetes care plan printed    Hypertension self-management support: Written self-care plan  (10/22/2009)   Hypertension self-care plan printed.    Lipid self-management support: Written self-care plan  (10/22/2009)   Lipid self-care plan printed.   Laboratory Results     Blood Tests   Date/Time Received: October 22, 2009 4:46 PM Date/Time Reported: Alric Quan  October 22, 2009 4:46 PM   HGBA1C: 6.5%   (Normal Range: Non-Diabetic - 3-6%   Control Diabetic - 6-8%) CBG Fasting:: 111mg /dL      Process Orders Check Orders Results:     [facility]: ABN not required for this insurance Tests Sent for requisitioning (October 24, 2009 1:16 PM):     10/22/2009: [facility] -- T-CMP with Estimated GFR [[id number]] (signed)     10/22/2009: [facility] -- T-Lipid Profile [phone] (signed)     10/22/2009: [facility] -- T-CBC No Diff [[id number]] (signed)     10/22/2009: [facility] -- T-TSH [phone] (signed)     Immunizations Administered:  Influenza Vaccine # 1:    Vaccine Type: Fluvax MCR    Site: right deltoid    Mfr: [employer]Smith[employer]    Dose: 0.5 ml    Route: IM    Given by: Angelina Ok, RN 5:7 PM    Exp. Date: 07/16/2010    Lot #: [id number]    VIS given: 08/10/09 version given October 22, 2009.  Flu Vaccine Consent Questions:    Do you have a history of severe allergic reactions to this vaccine? no    Any prior history of allergic reactions to egg and/or gelatin? no    Do you have a sensitivity to the preservative Thimersol? no    Do you have a past history of Guillan-Barre Syndrome? no    Do you currently have an acute febrile illness? no    Have you ever had a severe reaction to latex? no    Vaccine information given and explained to patient? yes    Are you currently pregnant? no

## 2010-02-15 NOTE — Progress Notes (Signed)
Summary: Refill/gh  Phone Note Refill Request Message from:  Fax from Pharmacy on January 18, 2009 12:01 PM  Refills Requested: Medication #1:  OMEPRAZOLE 20 MG CPDR Take 1 tablet by mouth once a day   Dosage confirmed as above?Dosage Confirmed   Supply Requested: 3 months   Last Refilled: 12/16/2008  Method Requested: Electronic Initial call taken by: Angelina Ok RN,  January 18, 2009 12:02 PM    Prescriptions: OMEPRAZOLE 20 MG CPDR (OMEPRAZOLE) Take 1 tablet by mouth once a day  #30 x 3   Entered and Authorized by:   Blanch Media MD   Signed by:   Blanch Media MD on 01/18/2009   Method used:   Electronically to        [facility] [phone]* (retail)       [address]       [place], [place]  [address]       Ph: [phone]       Fax: [phone]   RxID:   [id number]

## 2010-02-15 NOTE — Assessment & Plan Note (Signed)
Summary: EST-RESCH FROM 02/03/2009/CFB   Vital Signs:  Patient profile:   60 year old female Height:      59.5 inches (151.13 cm) Weight:      178.4 pounds (81.09 kg) BMI:     35.56 Temp:     97.2 degrees F Pulse rate:   69 / minute BP sitting:   137 / 75  (right arm) Cuff size:   large  Vitals Entered By: Dorie Rank RN (March 09, 2009 1:28 PM) CC: check up - see about b/p- stressed taking care of uncle with sister - sinus congestion, pain of head and eyes, Depression Is Patient Diabetic? Yes Did you bring your meter with you today? No Pain Assessment Patient in pain? no      Nutritional Status BMI of > 30 = obese CBG Result 89  Have you ever been in a relationship where you felt threatened, hurt or afraid?No   Does patient need assistance? Functional Status Self care Ambulation Normal Flu Vaccine Consent Questions     Do you have a history of severe allergic reactions to this vaccine? no    Any prior history of allergic reactions to egg and/or gelatin? no    Do you have a sensitivity to the preservative Thimersol? no    Do you have a past history of Guillan-Barre Syndrome? no    Do you currently have an acute febrile illness? no    Have you ever had a severe reaction to latex? no    Vaccine information given and explained to patient? yes    Are you currently pregnant? no    Lot [id number]   Exp Date:04/16/2009   Manufacturer: [employer]    Site Given  Left Deltoid IM Dorie Rank RN  March 09, 2009 2:39 PM     Diabetic Foot Exam Foot Inspection Is there a history of a foot ulcer?              No Is there a foot ulcer now?              No Can the patient see the bottom of their feet?          Yes Are the shoes appropriate in style and fit?          Yes Is there swelling or an abnormal foot shape?          No Are the toenails long?                No Are the toenails thick?                No Are the toenails ingrown?              No Is there heavy  callous build-up?              No Is there pain in the calf muscle (Intermittent claudication) when walking?    NoIs there a claw toe deformity?              No Is there elevated skin temperature?            No Is there limited ankle dorsiflexion?            No Is there foot or ankle muscle weakness?            No  Diabetic Foot Care Education Patient educated on appropriate care of diabetic feet.  Comments: sees Dr. Ralene Cork for foot care -  has pin in right foot left small toe nail darkened and thick - heel skin dry   10-g (5.07) Semmes-Weinstein Monofilament Test Performed by: Dorie Rank RN          Right Foot          Left Foot Visual Inspection     normal           normal Site 1         normal         normal Site 2         normal         normal Site 3         normal         normal Site 4         normal         normal Site 5         normal         normal Site 6                    normal  Impression      normal         normal  Legend:  Site 1 = Plantar aspect of first toe (center of pad) Site 2 = Plantar aspect of third toe (center of pad) Site 3 = Plantar aspect of fifth toe (center of pad) Site 4 = Plantar aspect of first metatarsal head Site 5 = Plantar aspect of third metatarsal head Site 6 = Plantar aspect of fifth metatarsal head Site 7 = Plantar aspect of medial midfoot Site 8 = Plantar aspect of lateral midfoot Site 9 = Plantar aspect of heel Site 10 = dorsal aspect of foot between the base of the first and second toes   Result is Abnormal if patient was unable to perceive the monofilament at site indicated.    Primary Care Provider:  Olene Craven MD  CC:  check up - see about b/p- stressed taking care of uncle with sister - sinus congestion, pain of head and eyes, and Depression.  History of Present Illness: 60 yr old woman with pmhx as described below comes to the clinic for 6mo f/u   c/o of stress due to dying uncle, does not check sugars..  needs  scrips, and blood work per [employer].   Pt denies fever, chills, or any other complaints, and otherwise doing well. She denies any CP,SOB,cough,AP,N,V,diarrhea or any other complaints.     Depression History:      The patient denies a depressed mood most of the day and a diminished interest in her usual daily activities.  The patient denies significant weight loss, significant weight gain, insomnia, hypersomnia, psychomotor agitation, psychomotor retardation, fatigue (loss of energy), feelings of worthlessness (guilt), impaired concentration (indecisiveness), and recurrent thoughts of death or suicide.        Comments:  sees [facility] and states meds are working well.   Preventive Screening-Counseling & Management  Alcohol-Tobacco     Smoking Status: quit     Year Quit: 1998  Caffeine-Diet-Exercise     Does Patient Exercise: yes   Current Medications (verified): 1)  Lotensin 20 Mg Tabs (Benazepril Hcl) .... Take 1 Tablet By Mouth Once A Day 2)  Hydrochlorothiazide 25 Mg Tabs (Hydrochlorothiazide) .... Take 1 Tablet By Mouth Once A Day 3)  Aspirin 81 Mg Tbec (Aspirin) .... Take 1 Tablet By Mouth Once A Day 4)  Neurontin 300  Mg Caps (Gabapentin) .... Take 1 Capsule By Mouth At Bedtime 5)  Simvastatin 40 Mg Tabs (Simvastatin) .... Take 1 Tablet By Mouth At Bedtime. 6)  Metformin Hcl 500 Mg Tabs (Metformin Hcl) .... Take 1 Tablet By Mouth Once A Day 7)  Paxil 20 Mg  Tabs (Paroxetine Hcl) 8)  Nasonex 50 Mcg/act Susp (Mometasone Furoate) .... Spray in Each Nostril Once A Day. 9)  Zyrtec Allergy 10 Mg Tabs (Cetirizine Hcl) .... Take 1 Tablet By Mouth Once A Day 10)  Vitamin D3 1000 Unit Caps (Cholecalciferol) .... Take 2 Tablets By Mouth Daily 11)  Calcium Carbonate 600 Mg Tabs (Calcium Carbonate) .... Take One Tab Twice Daily 12)  Acetaminophen 500 Mg Tabs (Acetaminophen) .... Take One Tab Up To 6 Times Daily For Pain. 13)  Ultram 50 Mg Tabs (Tramadol Hcl) .... Take 1  Tablet By Mouth Three Times A Day As Needed For Pain 14)  Omeprazole 20 Mg Cpdr (Omeprazole) .... Take 1 Tablet By Mouth Once A Day 15)  Ciprofloxacin Hcl 250 Mg Tabs (Ciprofloxacin Hcl) .... Take 1 Tablet By Mouth Twice A Day For 3 Days 16)  Prodigy Blood Glucose Monitor  Devi (Blood Glucose Monitoring Suppl) .... Use As Instructed 17)  Prodigy Blood Glucose Test  Strp (Glucose Blood) .... Use As Instructed  Allergies (verified): No Known Drug Allergies  Review of Systems       Patient is otherwise doing well, and denies any other complaints.    Physical Exam  General:  alert, well-developed, and cooperative to examination.    Mouth:   pharynx pink and moist, no erythema, and no exudates.    Neck:  supple, full ROM, no thyromegaly, no JVD, and no carotid bruits.    Lungs:  normal respiratory effort, no accessory muscle use, normal breath sounds, no crackles, and no wheezes.  Heart:  normal rate, regular rhythm, 2/6 systolic murmur, no gallop, and no rub.    Abdomen:  soft, non-tender, normal bowel sounds, no distention, no guarding, no rebound tenderness, no hepatomegaly, and no splenomegaly.    Msk:  no joint swelling, no joint warmth, and no redness over joints.    Pulses:  2+ DP/PT pulses bilaterally  Extremities:  .No cyanosis, clubbing, edema  Neurologic:  alert & oriented X3, cranial nerves II-XII intact, strength normal in all extremities, sensation intact to light touch, and gait normal.      Diabetes Management Exam:    Foot Exam (with socks and/or shoes not present):       Sensory-Monofilament:          Left foot: normal          Right foot: normal   Impression & Recommendations:  Problem # 1:  DIABETES MELLITUS, TYPE II (ICD-250.00) a1c is 6.1 today, well controlled, continue current regiment..   Her updated medication list for this problem includes:    Lotensin 20 Mg Tabs (Benazepril hcl) .Marland Kitchen... Take 1 tablet by mouth once a day    Aspirin 81 Mg Tbec (Aspirin)  .Marland Kitchen... Take 1 tablet by mouth once a day    Metformin Hcl 500 Mg Tabs (Metformin hcl) .Marland Kitchen... Take 1 tablet by mouth once a day  Orders: T-Hgb A1C (in-house) (69629BM) T- Capillary Blood Glucose (84132)  Problem # 2:  UTI (ICD-599.0) Treated on last visit, now asymptomatic, will not fu  Her updated medication list for this problem includes:    Ciprofloxacin Hcl 250 Mg Tabs (Ciprofloxacin hcl) .Marland Kitchen... Take 1 tablet by  mouth twice a day for 3 days  Problem # 3:  HYPERLIPIDEMIA (ICD-272.4)  last FLP stable in 01/2008, will recheck along with FLP, no adjustment made now to meds.   Her updated medication list for this problem includes:    Simvastatin 40 Mg Tabs (Simvastatin) .Marland Kitchen... Take 1 tablet by mouth at bedtime.    Labs Reviewed: SGOT: 19 (09/18/2008)   SGPT: 15 (09/18/2008)  Prior 10 Yr Risk Heart Disease: 17 % (01/16/2007)   HDL:47 (02/10/2008), 48 (01/16/2007)  LDL:88 (02/10/2008), 92 (01/16/2007)  Chol:147 (02/10/2008), 153 (01/16/2007)  Trig:60 (02/10/2008), 63 (01/16/2007)  Orders: T-Lipid Profile ([id number])  Problem # 4:  HYPERTENSION (ICD-401.9) patient attributes her BP elevation to recent stress due to illness in the family.  will monitor for now, and consider increase BP meds if remain elevated.   Her updated medication list for this problem includes:    Lotensin 20 Mg Tabs (Benazepril hcl) .Marland Kitchen... Take 1 tablet by mouth once a day    Hydrochlorothiazide 25 Mg Tabs (Hydrochlorothiazide) .Marland Kitchen... Take 1 tablet by mouth once a day  BP today: 137/75 Prior BP: 126/70 (09/30/2008)  Prior 10 Yr Risk Heart Disease: 17 % (01/16/2007)  Labs Reviewed: K+: 3.8 (09/18/2008) Creat: : 1.13 (09/18/2008)   Chol: 147 (02/10/2008)   HDL: 47 (02/10/2008)   LDL: 88 (02/10/2008)   TG: 60 (02/10/2008)  Problem # 5:  DEPRESSION (ICD-311)  stable, followed by [facility], checking BMET per [facility] request.  Her updated medication list for this problem includes:     Paxil 20 Mg Tabs (Paroxetine hcl)  Problem # 6:  Preventive Health Care (ICD-V70.0) performed, flu shot given.  Complete Medication List: 1)  Lotensin 20 Mg Tabs (Benazepril hcl) .... Take 1 tablet by mouth once a day 2)  Hydrochlorothiazide 25 Mg Tabs (Hydrochlorothiazide) .... Take 1 tablet by mouth once a day 3)  Aspirin 81 Mg Tbec (Aspirin) .... Take 1 tablet by mouth once a day 4)  Neurontin 300 Mg Caps (Gabapentin) .... Take 1 capsule by mouth at bedtime 5)  Simvastatin 40 Mg Tabs (Simvastatin) .... Take 1 tablet by mouth at bedtime. 6)  Metformin Hcl 500 Mg Tabs (Metformin hcl) .... Take 1 tablet by mouth once a day 7)  Paxil 20 Mg Tabs (Paroxetine hcl) 8)  Nasonex 50 Mcg/act Susp (Mometasone furoate) .... Spray in each nostril once a day. 9)  Zyrtec Allergy 10 Mg Tabs (Cetirizine hcl) .... Take 1 tablet by mouth once a day 10)  Vitamin D3 1000 Unit Caps (Cholecalciferol) .... Take 2 tablets by mouth daily 11)  Calcium Carbonate 600 Mg Tabs (Calcium carbonate) .... Take one tab twice daily 12)  Acetaminophen 500 Mg Tabs (Acetaminophen) .... Take one tab up to 6 times daily for pain. 13)  Ultram 50 Mg Tabs (Tramadol hcl) .... Take 1 tablet by mouth three times a day as needed for pain 14)  Omeprazole 20 Mg Cpdr (Omeprazole) .... Take 1 tablet by mouth once a day 15)  Ciprofloxacin Hcl 250 Mg Tabs (Ciprofloxacin hcl) .... Take 1 tablet by mouth twice a day for 3 days 16)  Prodigy Blood Glucose Monitor Devi (Blood glucose monitoring suppl) .... Use as instructed 17)  Prodigy Blood Glucose Test Strp (Glucose blood) .... Use as instructed  Other Orders: T-Comprehensive Metabolic Panel ([id number]) Mammogram (Screening) (Mammo) Flu Vaccine 84yrs + (29562) Administration Flu vaccine - MCR (Z3086)  Patient Instructions: 1)  Please schedule a follow-up appointment in 3 months. Prescriptions: PRODIGY BLOOD  GLUCOSE TEST  STRP (GLUCOSE BLOOD) use as instructed  #120 x 0   Entered and  Authorized by:   Darnelle Maffucci MD   Signed by:   Darnelle Maffucci MD on 03/09/2009   Method used:   Electronically to        [facility] [address] [phone]* (retail)       [address]       [place], [place]  [address]       Ph: [phone]       Fax: [phone]   RxID:   [id number] PRODIGY BLOOD GLUCOSE MONITOR  DEVI (BLOOD GLUCOSE MONITORING SUPPL) use as instructed  #1 x 0   Entered and Authorized by:   Darnelle Maffucci MD   Signed by:   Darnelle Maffucci MD on 03/09/2009   Method used:   Electronically to        [facility] [address] [phone]* (retail)       [address]       [place], [place]  [address]       Ph: [phone]       Fax: [phone]   RxID:   [id number] LOTENSIN 20 MG TABS (BENAZEPRIL HCL) Take 1 tablet by mouth once a day  #30 x 11   Entered and Authorized by:   Darnelle Maffucci MD   Signed by:   Darnelle Maffucci MD on 03/09/2009   Method used:   Electronically to        [facility] [address] [phone]* (retail)       [address]       [place], [place]  [address]       Ph: [phone]       Fax: [phone]   RxID:   [id number] HYDROCHLOROTHIAZIDE 25 MG TABS (HYDROCHLOROTHIAZIDE) Take 1 tablet by mouth once a day  #30 x 11   Entered and Authorized by:   Darnelle Maffucci MD   Signed by:   Darnelle Maffucci MD on 03/09/2009   Method used:   Electronically to        [facility] [address] [phone]* (retail)       [address]       [place], [place]  [address]       Ph: [phone]       Fax: [phone]   RxID:   [id number] SIMVASTATIN 40 MG TABS (SIMVASTATIN) Take 1 tablet by mouth at bedtime.  #30 x 11   Entered and Authorized by:   Darnelle Maffucci MD   Signed by:   Darnelle Maffucci MD on 03/09/2009   Method used:   Electronically to        [facility] [address] [phone]* (retail)       [address]       [place], [place]  [address]       Ph: [phone]       Fax: [phone]   RxID:   [id number] METFORMIN HCL 500 MG TABS (METFORMIN HCL) Take 1 tablet by  mouth once a day  #30 x 11   Entered and Authorized by:   Darnelle Maffucci MD   Signed by:   Darnelle Maffucci MD on 03/09/2009   Method used:   Electronically to        [facility] [address] [phone]* (retail)       [address]       [place], [place]  [address]       Ph: [phone]  Fax: [phone]   RxID:   [id number] NASONEX 50 MCG/ACT SUSP (MOMETASONE FUROATE) Spray in each nostril once a day.  #1 x 5   Entered and Authorized by:   Darnelle Maffucci MD   Signed by:   Darnelle Maffucci MD on 03/09/2009   Method used:   Electronically to        [facility] [address] [phone]* (retail)       [address]       [place], [place]  [address]       Ph: [phone]       Fax: [phone]   RxID:   [id number] ULTRAM 50 MG TABS (TRAMADOL HCL) Take 1 tablet by mouth three times a day as needed for pain  #60 x 1   Entered and Authorized by:   Darnelle Maffucci MD   Signed by:   Darnelle Maffucci MD on 03/09/2009   Method used:   Electronically to        [facility] [address] [phone]* (retail)       [address]       [place], [place]  [address]       Ph: [phone]       Fax: [phone]   RxID:   [id number] OMEPRAZOLE 20 MG CPDR (OMEPRAZOLE) Take 1 tablet by mouth once a day  #30 x 3   Entered and Authorized by:   Darnelle Maffucci MD   Signed by:   Darnelle Maffucci MD on 03/09/2009   Method used:   Electronically to        [facility] [address] [phone]* (retail)       [address]       [place], [place]  [address]       Ph: [phone]       Fax: [phone]   RxID:   [id number]  Process Orders Check Orders Results:     [facility]: ABN not required for this insurance Tests Sent for requisitioning (March 09, 2009 4:30 PM):     03/09/2009: [facility] -- T-Comprehensive Metabolic Panel [[id number]] (signed)     03/09/2009: [facility] -- T-Lipid Profile [id number] (signed)    Prevention & Chronic Care Immunizations    Influenza vaccine: Fluvax 3+  (03/09/2009)    Tetanus booster: 03/05/2006: Td    Pneumococcal vaccine: Not documented    H. zoster vaccine: Not documented  Colorectal Screening   Hemoccult: Not documented   Hemoccult action/deferral: Deferred  (03/09/2009)    Colonoscopy:  Results: Several diminutive polyps in rectosigmoid.  Results: Specimen sent for pathology.    Results: Diverticulosis.   Performed by Dr. Juanda Chance.       (03/29/2007)   Colonoscopy action/deferral: Repeat colonoscopy in 7 years.   (03/29/2007)  Other Screening   Pap smear: Not documented   Pap smear action/deferral: Deferred  (03/09/2009)    Mammogram: No specific mammographic evidence of malignancy.  No significant changes compared to previous study.  Assessment: BIRADS 2. Location: [facility].    (03/05/2008)   Mammogram action/deferral: Ordered  (03/09/2009)    DXA bone density scan: Not documented   Smoking status: quit  (03/09/2009)  Diabetes Mellitus   HgbA1C: 6.1  (03/09/2009)   HgbA1C action/deferral: Ordered  (03/09/2009)    Eye exam: Not documented   Diabetic eye exam action/deferral: Ophthalmology referral  (09/18/2008)    Foot exam: yes  (03/09/2009)   Foot exam action/deferral: Do today   High risk  foot: Not documented   Foot care education: Done  (03/09/2009)    Urine microalbumin/creatinine ratio: 4.8  (09/18/2008)   Urine microalbumin action/deferral: Ordered    Diabetes flowsheet reviewed?: Yes   Progress toward A1C goal: At goal  Lipids   Total Cholesterol: 147  (02/10/2008)   Lipid panel action/deferral: Lipid Panel ordered   LDL: 88  (02/10/2008)   LDL Direct: Not documented   HDL: 47  (02/10/2008)   Triglycerides: 60  (02/10/2008)    SGOT (AST): 19  (09/18/2008)   BMP action: Ordered   SGPT (ALT): 15  (09/18/2008) CMP ordered    Alkaline phosphatase: 70  (09/18/2008)   Total bilirubin: 0.4  (09/18/2008)    Lipid flowsheet reviewed?:  Yes   Progress toward LDL goal: At goal  Hypertension   Last Blood Pressure: 137 / 75  (03/09/2009)   Serum creatinine: 1.13  (09/18/2008)   BMP action: Ordered   Serum potassium 3.8  (09/18/2008) CMP ordered     Hypertension flowsheet reviewed?: Yes   Progress toward BP goal: At goal  Self-Management Support :   Personal Goals (by the next clinic visit) :     Personal A1C goal: 7  (03/09/2009)     Personal blood pressure goal: 130/80  (03/09/2009)     Personal LDL goal: 100  (03/09/2009)    Patient will work on the following items until the next clinic visit to reach self-care goals:     Medications and monitoring: take my medicines every day, check my blood sugar, bring all of my medications to every visit  (03/09/2009)     Eating: drink diet soda or water instead of juice or soda, eat more vegetables, eat foods that are low in salt, eat baked foods instead of fried foods, eat fruit for snacks and desserts  (03/09/2009)     Activity: join a walking program  (03/09/2009)     Other: will start check BS when gets new meter and bring in meds and goes to [facility]  (03/09/2009)    Diabetes self-management support: CBG self-monitoring log, Written self-care plan, Education handout, Pre-printed educational material, Resources for patients handout  (03/09/2009)   Diabetes care plan printed   Diabetes education handout printed    Hypertension self-management support: Written self-care plan, Education handout, Resources for patients handout  (03/09/2009)   Hypertension self-care plan printed.   Hypertension education handout printed    Lipid self-management support: Written self-care plan, Education handout, Resources for patients handout  (03/09/2009)   Lipid self-care plan printed.   Lipid education handout printed      Resource handout printed.   Nursing Instructions: HgbA1C today (see order) CBG today (see order) Diabetic foot exam today Give Flu vaccine today Schedule screening  mammogram (see order)    Laboratory Results   Blood Tests   Date/Time Received: March 09, 2009 2:07 PM Date/Time Reported: Alric Quan  March 09, 2009 2:07 PM  HGBA1C: 6.1%   (Normal Range: Non-Diabetic - 3-6%   Control Diabetic - 6-8%) CBG Random:: 89mg /dL       Process Orders Check Orders Results:     [facility]: ABN not required for this insurance Tests Sent for requisitioning (March 09, 2009 4:30 PM):     03/09/2009: [facility] -- T-Comprehensive Metabolic Panel [[id number]] (signed)     03/09/2009: [facility] -- T-Lipid Profile [id number] (signed)
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
## 2010-02-15 NOTE — Progress Notes (Signed)
Summary: REfill/gh  Phone Note Refill Request Message from:  Pharmacy on November 12, 2006 11:35 AM  Refills Requested: Medication #1:  ZOCOR 40 MG TABS Take 1 tablet by mouth at bedtime   Last Refilled: 10/11/2006  Method Requested: Electronic Initial call taken by: Angelina Ok RN,  November 12, 2006 11:36 AM  Follow-up for Phone Call        refilled elec Follow-up by: Clydie Braun MD,  November 12, 2006 12:44 PM  Additional Follow-up for Phone Call Additional follow up Details #1::       Additional Follow-up by: Angelina Ok RN,  November 12, 2006 2:29 PM      Prescriptions: ZOCOR 40 MG TABS (SIMVASTATIN) Take 1 tablet by mouth at bedtime  #30 x 11   Entered and Authorized by:   Clydie Braun MD   Signed by:   Clydie Braun MD on 11/12/2006   Method used:   Electronically sent to ...       [facility]  [address] [phone]*       [address]       [place], [place]  [address]       Ph: [phone]       Fax: [phone]   RxID:   [id number]

## 2010-02-15 NOTE — Progress Notes (Signed)
Summary: Needs BMET  Phone Note Outgoing Call   Call placed by: Angelina Ok, RN April 19, 2006 9:00 AM Call placed to: Patient Summary of Call: Call to pt to inform of need to have BMET/Labs done prior to further refills on meds. Msg left on pt's answering machine to call clinics Initial call taken by: Angelina Ok, RN April 19, 2006 9:00 AM

## 2010-02-15 NOTE — Letter (Signed)
Summary: Handout Printed  Printed Handout:  - *Patient Instructions 

## 2010-02-15 NOTE — Progress Notes (Signed)
Summary: MRI results  Phone Note Outgoing Call   Call placed by: Angelina Ok RN,  March 07, 2006 2:05 PM Call placed to: Patient Action Taken: Phone Call Completed Summary of Call: Phone call to pt to notify of [facility] Xray reports.  Msg left on pt's answering machine to call Clinics. Initial call taken by: Angelina Ok RN,  March 07, 2006 3:27 PM

## 2010-02-15 NOTE — Progress Notes (Signed)
Summary: Refill request/dms  Phone Note Refill Request Message from:  Fax from Pharmacy on July 31, 2006 9:20 AM  Refills Requested: Medication #1:  HYDROCHLOROTHIAZIDE 25 MG TABS Take 1 tablet by mouth once a day   Last Refilled: 07/04/2006  Method Requested: Fax to Local Pharmacy Initial call taken by: Henderson Cloud,  July 31, 2006 9:20 AM  Follow-up for Phone Call        Refill approved-nurse to complete Follow-up by: Eliseo Gum MD,  July 31, 2006 9:36 AM  Additional Follow-up for Phone Call Additional follow up Details #1::        Rx faxed to pharmacy Additional Follow-up by: Henderson Cloud,  July 31, 2006 10:21 AM     Prescriptions: HYDROCHLOROTHIAZIDE 25 MG TABS (HYDROCHLOROTHIAZIDE) Take 1 tablet by mouth once a day  #30 x 5   Entered and Authorized by:   Eliseo Gum MD   Signed by:   Eliseo Gum MD on 07/31/2006   Method used:   Telephoned to ...       [facility] [address]       [address] [place] [place], [place]  [address]       Ph: [phone]       Fax: [phone]   RxID:   [id number]

## 2010-02-15 NOTE — Progress Notes (Signed)
  Phone Note Call from Patient   Summary of Call: Pt called and wanted a referral to [facility], she called to get appointment but needed referral from Dr Reynold Bowen.  Talked with Dr Reynold Bowen and said referral was okay, but state this is patients request. Referred to Dr Beaulah Dinning at [facility]  Initial call taken by: Merrie Roof RN,  October 02, 2006 9:01 AM

## 2010-02-15 NOTE — Assessment & Plan Note (Signed)
Summary: ED follow-up/gg   Vital Signs:  Patient Profile:   60 Years Old Female Height:     61.75 inches (156.85 cm) Weight:      194.8 pounds BMI:     36.05 Temp:     98.7 degrees F oral Pulse rate:   89 / minute BP sitting:   135 / 85  (right arm)  Pt. in pain?   yes    Location:   shoulder    Intensity:   5    Type:       aching  Vitals Entered ByFilomena Jungling NT II (September 24, 2007 10:05 AM)              Is Patient Diabetic? No Nutritional Status BMI of 25 - 29 = overweight  Have you ever been in a relationship where you felt threatened, hurt or afraid?No   Does patient need assistance? Functional Status Self care Ambulation Normal     PCP:  Olene Craven MD  Chief Complaint:  ER FOLLOW-UP.  History of Present Illness: [name] is 60 yo woman who was recently seen in the ER for left shoulder pain that migrated to right lower back and left ankle.  Shoulder pain has been present for about a month per her report but she reports seeing Dr. Penni Bombard for a similar pain in February for which she was given a TENS unit.  She has not been using TENS unit as it is not working.  Tingling sensation in both arms for about a week without weakness.  Numbness in left arm and right hand.  Pain in right foot radiates from right foot to right buttock. without weakness or numbness in that leg  Pt had surgery on right foot with placement of a pin in 2000.      Updated Prior Medication List: LOTENSIN 20 MG TABS (BENAZEPRIL HCL) Take 1 tablet by mouth once a day HYDROCHLOROTHIAZIDE 25 MG TABS (HYDROCHLOROTHIAZIDE) Take 1 tablet by mouth once a day ASPIRIN 81 MG TBEC (ASPIRIN) Take 1 tablet by mouth once a day NEURONTIN 300 MG CAPS (GABAPENTIN) Take 1 capsule by mouth at bedtime METAMUCIL  POWD (PSYLLIUM POWD) Use as directed ZOCOR 40 MG TABS (SIMVASTATIN) Take 1 tablet by mouth at bedtime GLUCOPHAGE 500 MG TABS (METFORMIN HCL) One tablet daily CLARITIN-D 24 HOUR 10-240 MG TB24  (LORATADINE-PSEUDOEPHEDRINE) Take 1 tablet by mouth once a day NASAL SALINE 0.65 % SOLN (SALINE) 2 sprays in each nostril 1 to 2 times daily PAXIL 20 MG  TABS (PAROXETINE HCL)  NORCO 5-325 MG TABS (HYDROCODONE-ACETAMINOPHEN) TAke one tablet by mouth as needed for pain ROBAXIN 100 MG/ML SOLN (METHOCARBAMOL) Take one tablet as needed for pain  Current Allergies: No known allergies     Risk Factors: Tobacco use:  quit    Year quit:  1998 Exercise:  yes   Review of Systems  General      Denies chills and fever.  CV      Denies chest pain or discomfort, difficulty breathing at night, near fainting, palpitations, shortness of breath with exertion, and swelling of feet.  GI      Denies abdominal pain, bloody stools, dark tarry stools, diarrhea, nausea, and vomiting.   Physical Exam  General:     alert and NAD Lungs:     normal respiratory effort and normal breath sounds.   Heart:     normal rate, regular rhythm, and no murmur.   Abdomen:  soft, non-tender, and normal bowel sounds.   Extremities:     Left should with TTP along bicep tendon, supraspinatous insertion and infraspinatous insertion.  Tingling in left upper extremity reproduced with palpation of left shoulder.  No tingling or radicular pain with rotation, flexion, or extension of the neck, no TTP of cervical spine.  Unable to ellicit reflexes.  FROM of left shoulder with pain on abduction.  Negative straight leg raise.      Impression & Recommendations:  Problem # 1:  SHOULDER PAIN, LEFT (ICD-719.41) Recurrent problem.  I suspect rotator cuff muscle tear with edema causing mild peripheral nerve impingement.  Will request records from Dr. Markus Jarvis office and refer to orthopedist on her plan.   If she cannot get in to see an orthopedist within a couple of weeks, will check an MRI of left shoulder and cervical spine.  Pt reports that she still has analgesics from ER for pain.  She will call us with any progression  of her symptoms and will return to PT for repair of TENS unit.       Her updated medication list for this problem includes:    Aspirin 81 Mg Tbec (Aspirin) .Marland Kitchen... Take 1 tablet by mouth once a day    Norco 5-325 Mg Tabs (Hydrocodone-acetaminophen) .Marland Kitchen... Take one tablet by mouth as needed for pain    Robaxin 100 Mg/ml Soln (Methocarbamol) .Marland Kitchen... Take one tablet as needed for pain   Complete Medication List: 1)  Lotensin 20 Mg Tabs (Benazepril hcl) .... Take 1 tablet by mouth once a day 2)  Hydrochlorothiazide 25 Mg Tabs (Hydrochlorothiazide) .... Take 1 tablet by mouth once a day 3)  Aspirin 81 Mg Tbec (Aspirin) .... Take 1 tablet by mouth once a day 4)  Neurontin 300 Mg Caps (Gabapentin) .... Take 1 capsule by mouth at bedtime 5)  Metamucil Powd (Psyllium powd) .... Use as directed 6)  Zocor 40 Mg Tabs (Simvastatin) .... Take 1 tablet by mouth at bedtime 7)  Glucophage 500 Mg Tabs (Metformin hcl) .... One tablet daily 8)  Claritin-d 24 Hour 10-240 Mg Tb24 (Loratadine-pseudoephedrine) .... Take 1 tablet by mouth once a day 9)  Nasal Saline 0.65 % Soln (Saline) .... 2 sprays in each nostril 1 to 2 times daily 10)  Paxil 20 Mg Tabs (Paroxetine hcl) 11)  Norco 5-325 Mg Tabs (Hydrocodone-acetaminophen) .... Take one tablet by mouth as needed for pain 12)  Robaxin 100 Mg/ml Soln (Methocarbamol) .... Take one tablet as needed for pain   Patient Instructions: 1)  Please schedule a follow-up appointment with your primary care physician in 3 months. 2)  Please call Filomena Jungling at [phone] with the names of orthopedist on your insurance plan.  Please call us if pain worsens.     ]
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
## 2010-02-15 NOTE — Miscellaneous (Signed)
Summary: [facility]   Imported By: Margie Billet 04/05/2009 10:19:43  _____________________________________________________________________  External Attachment:    Type:   Image     Comment:   External Document
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
## 2010-02-15 NOTE — Consult Note (Signed)
Summary: [facility]Dr[facility] Willa Rough  [facility]Dr[facility]   Imported By: Florinda Marker 02/22/2007 16:18:02  _____________________________________________________________________  External Attachment:    Type:   Image     Comment:   External Document  Appended Document: [facility]Dr. 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

## 2010-02-15 NOTE — Progress Notes (Signed)
Summary: refill/ hla  Phone Note Refill Request Message from:  Fax from Pharmacy on March 16, 2006 12:50 PM  Refills Requested: Medication #1:  GLUCOPHAGE 500 MG TABS One tablet daily   Last Refilled: 02/09/2006  Medication #2:  HYDROCHLOROTHIAZIDE 25 MG TABS Take 1 tablet by mouth once a day   Last Refilled: 02/09/2006 Initial call taken by: Marin Roberts RN,  March 16, 2006 12:51 PM  Follow-up for Phone Call        Refill approved-nurse to complete.  Last labs done 03/08/05; havel requested that PCP Dr. Shannan Harper obtain a comprehensive metabolic panel at next visit. Follow-up by: Margarito Liner MD,  March 16, 2006 3:54 PM  Additional Follow-up for Phone Call Additional follow up Details #1::        Rx faxed to pharmacy Additional Follow-up by: Marin Roberts RN,  March 16, 2006 4:38 PM    Prescriptions: GLUCOPHAGE 500 MG TABS (METFORMIN HCL) One tablet daily  #30 x 3   Entered and Authorized by:   Margarito Liner MD   Signed by:   Margarito Liner MD on 03/16/2006   Method used:   Telephoned to ...       [facility] [address]       [address]       [place], [place]  [address]  [place]       Ph: [phone]       Fax: [phone]   RxID:   [id number] HYDROCHLOROTHIAZIDE 25 MG TABS (HYDROCHLOROTHIAZIDE) Take 1 tablet by mouth once a day  #30 x 3   Entered and Authorized by:   Margarito Liner MD   Signed by:   Margarito Liner MD on 03/16/2006   Method used:   Telephoned to ...       [facility] [address]       [address]       [place], [place]  [address]  [place]       Ph: [phone]       Fax: [phone]   RxID:   [id number]

## 2010-02-15 NOTE — Assessment & Plan Note (Signed)
Summary: NP KNEE PAIN/POSSIBLE ARTHRITIS/MJD   Vital Signs:  Patient profile:   60 year old female BP sitting:   126 / 70  Vitals Entered By: Lillia Pauls CMA (September 30, 2008 11:53 AM)  Primary Provider:  Olene Craven MD   History of Present Illness: pleasnt lady with several months of Knee pain primarily on left Xrays show DJD of medial compartment - with loss of cartilage has had some swelling feels that it pops at times has not given way  wakes her at night 2/2 pain currently not using that much of her Ultram but this does seem to help  Allergies: No Known Drug Allergies  Physical Exam  General:  Well-developed,well-nourished,in no acute distress; alert,appropriate and cooperative throughout examination Msk:  LT knee exam shows mild effusion; stable ligaments; negative Mcmurray's and provocative meniscal tests but she cannot get normal flexion without pain; non painful patellar compression; patellar and quadriceps tendons unremarkable.  medail joint line is tender on LT  RT knee still has limited flexion and similar exam except no effusion today  standing genu valgus 3 to 5 deg on RT and 10 deg on left this affects her walking gait    Impression & Recommendations:  Problem # 1:  KNEE PAIN (ICD-719.46)  Her updated medication list for this problem includes:    Aspirin 81 Mg Tbec (Aspirin) .Marland Kitchen... Take 1 tablet by mouth once a day    Acetaminophen 500 Mg Tabs (Acetaminophen) .Marland Kitchen... Take one tab up to 6 times daily for pain.    Ultram 50 Mg Tabs (Tramadol hcl) .Marland Kitchen... Take 1 tablet by mouth three times a day as needed for pain  Orders: Knee Support Pat cutout [phone])  I encouraged in increased use of ultaram - at least twice daily for pain relief and at night add tylenol with it  Problem # 2:  DEGENERATIVE JOINT DISEASE (ICD-715.90)  Her updated medication list for this problem includes:    Aspirin 81 Mg Tbec (Aspirin) .Marland Kitchen... Take 1 tablet by mouth once a  day    Acetaminophen 500 Mg Tabs (Acetaminophen) .Marland Kitchen... Take one tab up to 6 times daily for pain.    Ultram 50 Mg Tabs (Tramadol hcl) .Marland Kitchen... Take 1 tablet by mouth three times a day as needed for pain  LT knee is moderately severe trial olf 1 month in don joy support when up and walking arch support and heel wege added to insoles to help stabilize her genu valgus felt much more comfort after these changes  Monitor for 1 month If not improving come back for injection  Complete Medication List: 1)  Lotensin 20 Mg Tabs (Benazepril hcl) .... Take 1 tablet by mouth once a day 2)  Hydrochlorothiazide 25 Mg Tabs (Hydrochlorothiazide) .... Take 1 tablet by mouth once a day 3)  Aspirin 81 Mg Tbec (Aspirin) .... Take 1 tablet by mouth once a day 4)  Neurontin 300 Mg Caps (Gabapentin) .... Take 1 capsule by mouth at bedtime 5)  Simvastatin 40 Mg Tabs (Simvastatin) .... Take 1 tablet by mouth at bedtime. 6)  Metformin Hcl 500 Mg Tabs (Metformin hcl) .... Take 1 tablet by mouth once a day 7)  Paxil 20 Mg Tabs (Paroxetine hcl) 8)  Nasonex 50 Mcg/act Susp (Mometasone furoate) .... Spray in each nostril once a day. 9)  Zyrtec Allergy 10 Mg Tabs (Cetirizine hcl) .... Take 1 tablet by mouth once a day 10)  Vitamin D3 1000 Unit Caps (Cholecalciferol) .... Take 2 tablets by  mouth daily 11)  Calcium Carbonate 600 Mg Tabs (Calcium carbonate) .... Take one tab twice daily 12)  Acetaminophen 500 Mg Tabs (Acetaminophen) .... Take one tab up to 6 times daily for pain. 13)  Ultram 50 Mg Tabs (Tramadol hcl) .... Take 1 tablet by mouth three times a day as needed for pain 14)  Omeprazole 20 Mg Cpdr (Omeprazole) .... Take 1 tablet by mouth once a day 15)  Ciprofloxacin Hcl 250 Mg Tabs (Ciprofloxacin hcl) .... Take 1 tablet by mouth twice a day for 3 days

## 2010-02-15 NOTE — Progress Notes (Signed)
Summary: med refill/wl  Phone Note Refill Request Message from:  Fax from Pharmacy on July 30, 2006 11:51 AM  Refills Requested: Medication #1:  GLUCOPHAGE 500 MG TABS One tablet daily   Dosage confirmed as above?Dosage Confirmed   Last Refilled: 07/04/2006   Notes: CMET 04/27/06 wnl.  Method Requested: Fax to Local Pharmacy Initial call taken by: Dorene Sorrow RN,  July 30, 2006 11:52 AM  Follow-up for Phone Call        Refill approved-nurse to complete Follow-up by: Manning Charity MD,  July 30, 2006 2:45 PM  Additional Follow-up for Phone Call Additional follow up Details #1::        Rx faxed to pharmacy Additional Follow-up by: Dorene Sorrow RN,  July 30, 2006 4:00 PM     Prescriptions: GLUCOPHAGE 500 MG TABS (METFORMIN HCL) One tablet daily  #30 x 5   Entered and Authorized by:   Manning Charity MD   Signed by:   Manning Charity MD on 07/30/2006   Method used:   Telephoned to ...       [facility]       [address]       [place], [place]  [address]  [place]       Ph: [phone]       Fax: [phone]   RxID:   [id number]

## 2010-02-15 NOTE — Assessment & Plan Note (Signed)
Summary: CHECKUP/ SB.   Vital Signs:  Patient Profile:   60 Years Old Female Weight:      195.2 pounds (88.73 kg) Temp:     97.6 degrees F oral Pulse rate:   81 / minute BP sitting:   107 / 76  (right arm)  Pt. in pain?   yes    Location:   joints aches    Intensity:   5    Type:       aching  Vitals Entered ByFilomena Jungling (February 16, 2006 2:24 PM)              Is Patient Diabetic? Yes  Nutritional Status Obese  Have you ever been in a relationship where you felt threatened, hurt or afraid?No   Does patient need assistance? Functional Status Self care Ambulation Normal      Visit Type:  ACV  Chief Complaint:  Cold & URI symptoms.  History of Present Illness:       Mariah Henderson is a 60 years old women with PMH sig for Diabetes mellitus, type II withexcellent controle, Hyperlipidemia, Hypertension, Low back pain, Cervical spondylosis, Obesity, depression, Chronic serous otitis media presents today with URI symptoms.  The patient presents with nasal congestion and dry cough, but denies purulent nasal discharge, sore throat, earache, and sick contacts. Also denies  h/o fever, nausea or vomiting, or chest pains.   Also has h/o nasal allergies and sinusitis in past and mild muscle aches especially L shoulder and arm. No h/o rheumatoid arthritis or OA in past or in family. Regarding DM,denies any h/o hypoglycemic episodes, blurry vision, neuropathy.or signs or symp suggesting hypo/hyperthyroidism   Prior Medications: LOTENSIN 20 MG TABS (BENAZEPRIL HCL) Take 1 tablet by mouth once a day HYDROCHLOROTHIAZIDE 25 MG TABS (HYDROCHLOROTHIAZIDE) Take 1 tablet by mouth once a day ASPIRIN 81 MG TBEC (ASPIRIN) Take 1 tablet by mouth once a day NEURONTIN 300 MG CAPS (GABAPENTIN) Take 1 capsule by mouth at bedtime SEROQUEL 100 MG TABS (QUETIAPINE FUMARATE) Take 1and 1/2  tablets by mouth at bedtime PROZAC 20 MG CAPS (FLUOXETINE HCL) Take 3 capsules by mouth every morning METAMUCIL   POWD (PSYLLIUM POWD) Use as directed ZOCOR 40 MG TABS (SIMVASTATIN) Take 1 tablet by mouth at bedtime BENADRYL ALLERGY 12.5 MG CHEW (DIPHENHYDRAMINE HCL) Take 1 tablet by mouth at bedtime GLUCOPHAGE 500 MG TABS (METFORMIN HCL) One tablet daily Current Allergies (reviewed today): No known allergies   Past Medical History:    Diabetes mellitus, type II    Hyperlipidemia    Hypertension    Low back pain    Cervical spondylosis    Obesity    Depression    Chronic serous otitis media    Foot drop    Constipation    Paraumbulical hernia Hernia   Social History:    Used to smoke in past quit 1998    Lives alone in [place]   Risk Factors:  Tobacco use:  quit    Year quit:  1998   Review of Systems  The patient denies fever, chills, anorexia, fatigue, chest pain, palpitations, dyspnea on exertion, orthopnea, excessive sputum, hemoptysis, and wheezing.     Physical Exam  General:     alert and well-developed.  alert and well-developed.   Head:     normocephalic.  normocephalic.   Eyes:     pupils equal, pupils round, and pupils reactive to light.  pupils equal, pupils round, and pupils reactive to light.  Ears:     R ear normal and L ear normal.  R ear normal and L ear normal.   Nose:     no external deformity.  no external deformity, nasal dischargemucosal pallor, and mucosal edema.   Mouth:     good dentition, pharynx pink and moist, no erythema, no exudates, and postnasal drip.  good dentition, pharynx pink and moist, no erythema, no exudates, and postnasal drip.   Abdomen:     soft, non-tender, normal bowel sounds, no distention, and no masses.  soft, non-tender, normal bowel sounds, no distention, and no masses.     Shoulder/Elbow Exam  Shoulder Exam:    Right:    Inspection:  Normal    Palpation:  Normal    Left:    Inspection:  Normal    Palpation:  Abnormal       Location:  right bicipital groove    Stability:  stable    Tenderness:  right  bicipital groove    Swelling:  no    Erythema:  no    Range of Motion:       Flexion-Active: 50       Extension-Active: 60       Flexion-Passive: 50       Extension-Passive: 50    Impression & Recommendations:  Problem # 1:  URI (ICD-465.9) Most likely sec to Viral or allergic bronchitis. Given her h/o diabetes and other comorbidities and persistant problems since last 2 weeks will start  emperic therapy with antibiotic, levaquin 750 mg by mouth daily X 5 days for possible bronchitis vs sinusitis. She can use saline nasal spray and Robitussin Dm 100-10 Mg/45ml Syrp (Dextromethorphan-guaifenesin) .Marland KitchenMarland KitchenMarland KitchenMarland Kitchen 10 cc by mouth upto 4 times a day as needec for cough and Claritin-d 24 Hour 10-240 Mg Tb24 (Loratadine-pseudoephedrine) .Marland Kitchen... Take 1 tablet by mouth once a day for allergies      Orders: Administration Flu vaccine (J8119)     Problem # 2:  SHOULDER PAIN, LEFT (ICD-719.41) Possible partial rotator cuff tear vs tendinitis. Consider MRI in future if needed if symptoms persist. start  Ibuprofen 400 Mg Tabs (Ibuprofen) ..... One tablet three times a day for 10 days for antiinflammatory effect and see how she does. She can also use tylenol as needed for pain and limit activity to comfort.  Apply ice to affected area for 20 minutes frequently for 2-3 days to minimize swelling. Take anti-inflammatory as directed .     Problem # 3:  DIABETES MELLITUS, TYPE II (ICD-250.00) Excellent controle HbA1c is 6.0 today . Continue :    Aspirin 81 Mg Tbec (Aspirin) .Marland Kitchen... Take 1 tablet by mouth once a day    Glucophage 500 Mg Tabs (Metformin hcl) ..... One tablet daily Last Ur microalb/cr wnl nov/o6 and she is on lotensin Last TSH wnl 2/07 Foot exam wnl  Orders: T- Capillary Blood Glucose (82948) T-Hgb A1C (in-house) (14782NF)     Problem # 4:  HYPERTENSION (ICD-401.9) Excellent controle continue current medication:    Lotensin 20 Mg Tabs (Benazepril hcl) .Marland Kitchen... Take 1 tablet by mouth once a  day    Hydrochlorothiazide 25 Mg Tabs (Hydrochlorothiazide) .Marland Kitchen... Take 1 tablet by mouth once a day     Problem # 5:  Preventive Health Care (ICD-V70.0) Flu shot given today Last mammogram 06/23/04 wnl needs to arrange for f/u mammogram today Had hysterectomy does not need pap smear Colonoscopy is still pending. She missed the stool cards during this visit.   Problem #  6:  DEPRESSION (ICD-311) Follows with [facility] Her updated medication list for this problem includes:    Prozac 20 Mg Caps (Fluoxetine hcl) .Marland Kitchen... Take 3 capsules by mouth every morning   Medications Added to Medication List This Visit: 1)  Robitussin Dm 100-10 Mg/39ml Syrp (Dextromethorphan-guaifenesin) .Marland Kitchen.. 10 cc by mouth upto 4 times a day as needec for cough 2)  Claritin-d 24 Hour 10-240 Mg Tb24 (Loratadine-pseudoephedrine) .... Take 1 tablet by mouth once a day 3)  Nasal Saline 0.65 % Soln (Saline) .... One to 2 times daily 4)  Levaquin 750 Mg Tabs (Levofloxacin) .... Take 1 tablet by mouth once a day 5)  Ibuprofen 400 Mg Tabs (Ibuprofen) .... One tablet three times a day  Complete Medication List: 1)  Lotensin 20 Mg Tabs (Benazepril hcl) .... Take 1 tablet by mouth once a day 2)  Hydrochlorothiazide 25 Mg Tabs (Hydrochlorothiazide) .... Take 1 tablet by mouth once a day 3)  Aspirin 81 Mg Tbec (Aspirin) .... Take 1 tablet by mouth once a day 4)  Neurontin 300 Mg Caps (Gabapentin) .... Take 1 capsule by mouth at bedtime 5)  Seroquel 100 Mg Tabs (Quetiapine fumarate) .... Take 1and 1/2  tablets by mouth at bedtime 6)  Prozac 20 Mg Caps (Fluoxetine hcl) .... Take 3 capsules by mouth every morning 7)  Metamucil Powd (Psyllium powd) .... Use as directed 8)  Zocor 40 Mg Tabs (Simvastatin) .... Take 1 tablet by mouth at bedtime 9)  Benadryl Allergy 12.5 Mg Chew (Diphenhydramine hcl) .... Take 1 tablet by mouth at bedtime 10)  Glucophage 500 Mg Tabs (Metformin hcl) .... One tablet daily 11)  Robitussin Dm 100-10 Mg/4ml Syrp  (Dextromethorphan-guaifenesin) .Marland Kitchen.. 10 cc by mouth upto 4 times a day as needec for cough 12)  Claritin-d 24 Hour 10-240 Mg Tb24 (Loratadine-pseudoephedrine) .... Take 1 tablet by mouth once a day 13)  Nasal Saline 0.65 % Soln (Saline) .... One to 2 times daily 14)  Levaquin 750 Mg Tabs (Levofloxacin) .... Take 1 tablet by mouth once a day 15)  Ibuprofen 400 Mg Tabs (Ibuprofen) .... One tablet three times a day  Other Orders: Mammogram (Mammogram) State- FLU Vaccine 4yrs [phone]) Admin 1st Vaccine (State) [id number])   Patient Instructions: 1)  Please schedule a follow-up appointment in 2 weeks. 2)  Limit activity to comfort.  Apply ice to affected area for 20 minutes frequently for 2-3 days to minimize swelling. Take anti-inflammatory as directed. 3)  Take 400-600mg  of Ibuprofen (Advil, Motrin) every 4-6 hours as needed for relief of pain or comfort of fever. 4)  Take antibiotic as prescribed until ALL gone to prevent relapse and resistence of respiratory infection.   5)  Call us immediately if you have fever,chest pain, chills or vomiting  Laboratory Results      Date/Time Recieved:  February 16, 2006 2:48 PM  Date/Time Reported:  February 16, 2006 2:48 PM ..................................................................Marland KitchenAlric Quan  February 16, 2006 2:48 PM    Blood Tests Glucose (random): 98 mg/dL   (Normal Range: 01-027) HGBA1C: 6.0%   (Normal Range: Non-Diabetic - 3-6%   Control Diabetic - 6-8%)   Other Tests    Influenza Vaccine    Vaccine Type: State Fluvax 3    Site: right deltoid    Mfr: Sanofi Pasteur    Dose: 0.5 ml    Route: IM    Given by: Lynn Ito, CMA (AAMA)    Exp. Date: 07/16/2006    Lot #: [id number]  VIS given: 07/31/05 version given February 16, 2006.  Flu Vaccine Consent Questions    Do you have a history of severe allergic reactions to this vaccine? no    Any prior history of allergic reactions to egg and/or gelatin? no    Do you have a  sensitivity to the preservative Thimersol? no    Do you have a past history of Guillan-Barre Syndrome? no    Do you currently have an acute febrile illness? no    Have you ever had a severe reaction to latex? no    Vaccine information given and explained to patient? yes    Are you currently pregnant? no

## 2010-02-15 NOTE — Progress Notes (Signed)
Summary: refill/gg  Phone Note Refill Request  on May 10, 2006 12:11 PM  Refills Requested: Medication #1:  ZOCOR 40 MG TABS Take 1 tablet by mouth at bedtime   Last Refilled: 04/03/2006 Initial call taken by: Merrie Roof RN,  May 10, 2006 12:11 PM  Follow-up for Phone Call        Refill approved-nurse to complete Follow-up by: Duncan Dull MD,  May 10, 2006 12:32 PM  Additional Follow-up for Phone Call Additional follow up Details #1::        Rx called to pharmacy Additional Follow-up by: Marin Roberts RN,  May 11, 2006 3:09 PM    Prescriptions: ZOCOR 40 MG TABS (SIMVASTATIN) Take 1 tablet by mouth at bedtime  #31 x 5   Entered and Authorized by:   Duncan Dull MD   Signed by:   Duncan Dull MD on 05/10/2006   Method used:   Telephoned to ...       [facility]       [address]       [place], [place]  [address]       Ph: [phone] or [phone]       Fax: [phone]   RxID:   [id number]
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
## 2010-02-15 NOTE — Progress Notes (Signed)
Summary: appointment or labs  Phone Note Outgoing Call   Call placed by: Angelina Ok RN,  March 20, 2006 10:29 AM Call placed to: Patient Action Taken: Appt scheduled Details for Reason: Lab appointment Details of Action Taken: Msg left for pt to call Summary of Call: Call to pt msg left for pt.  to call office.  Needs labs drawn per Dr. Shannan Harper.  Lipid Panel and CMET.  Sceduled for 03/26/06.  Return call from pt on 03/21/06.  Informed of need for lab work per order of Dr. Shannan Harper.   Pt can come in for fasting labs on 03/23/06. Initial call taken by: Angelina Ok RN,  March 20, 2006 10:31 AM

## 2010-02-15 NOTE — Progress Notes (Signed)
Summary: Refill request/dms  Phone Note Refill Request Message from:  Fax from Pharmacy on Jun 04, 2006 3:34 PM  Refills Requested: Medication #1:  LOTENSIN 20 MG TABS Take 1 tablet by mouth once a day   Last Refilled: 04/16/2006  Method Requested: Fax to Local Pharmacy Initial call taken by: Henderson Cloud,  Jun 04, 2006 3:35 PM  Follow-up for Phone Call        Refill approved-nurse to complete Follow-up by: Eliseo Gum MD,  Jun 04, 2006 3:40 PM  Additional Follow-up for Phone Call Additional follow up Details #1::        Rx faxed to pharmacy Additional Follow-up by: Henderson Cloud,  Jun 04, 2006 4:11 PM    Prescriptions: LOTENSIN 20 MG TABS (BENAZEPRIL HCL) Take 1 tablet by mouth once a day  #31 x 5   Entered and Authorized by:   Eliseo Gum MD   Signed by:   Eliseo Gum MD on 06/04/2006   Method used:   Telephoned to ...       [facility]       [address]       [place], [place]  [address]       Ph: [phone] or [phone]       Fax: [phone]   RxID:   [id number]

## 2010-02-15 NOTE — Letter (Signed)
Summary: ABC Sheet  ABC Sheet   Imported By: Florinda Marker 05/12/2008 16:43:50  _____________________________________________________________________  External Attachment:    Type:   Image     Comment:   External Document

## 2010-02-15 NOTE — Progress Notes (Signed)
Summary: refill/gh  Phone Note Refill Request Message from:  Fax from Pharmacy on April 16, 2006 11:21 AM  Refills Requested: Medication #1:  LOTENSIN 20 MG TABS Take 1 tablet by mouth once a day   Last Refilled: 03/16/2006 Initial call taken by: Angelina Ok RN,  April 16, 2006 11:21 AM  Follow-up for Phone Call        OK to refillon limited basis.  Needs BMET before further refills.   Follow-up by: Manning Charity MD,  April 16, 2006 12:20 PM  Additional Follow-up for Phone Call Additional follow up Details #1::        Rx faxed to pharmacy Additional Follow-up by: Angelina Ok RN,  April 16, 2006 1:58 PM    Prescriptions: LOTENSIN 20 MG TABS (BENAZEPRIL HCL) Take 1 tablet by mouth once a day  #31 x 0   Entered and Authorized by:   Manning Charity MD   Signed by:   Manning Charity MD on 04/16/2006   Method used:   Telephoned to ...         RxID:   [id number]

## 2010-02-15 NOTE — Assessment & Plan Note (Signed)
Summary: swollen in leg & arm [mkj]   Vital Signs:  Patient profile:   60 year old female Height:      61.75 inches Weight:      197.6 pounds BMI:     36.57 Temp:     98.2 degrees F oral Pulse rate:   79 / minute BP sitting:   123 / 82  (right arm)  Vitals Entered By: Filomena Jungling NT II (June 30, 2008 3:59 PM) CC: right side arm swollen, knee,and ankle Is Patient Diabetic? Yes  Pain Assessment Patient in pain? yes     Location: knee,arm,ankle Intensity: 5 Type: aching Onset of pain  Gradual Nutritional Status BMI of > 30 = obese  Have you ever been in a relationship where you felt threatened, hurt or afraid?No   Does patient need assistance? Functional Status Self care Ambulation Normal   Primary Care Provider:  Olene Craven MD  CC:  right side arm swollen, knee, and and ankle.  History of Present Illness: This is a 60   y/o woman with PMH of: Diabetes mellitus, type II Hyperlipidemia Hypertension Low back pain Cervical spondylosis Obesity Depression    - followed by Dr. 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  Started about 2 weeks ago with her foot first.  Her arm has been feeling feeling "puffy" for months however.  Swelling of her legs worst at the end of the day and affects the left more so than the right.  States that she has been having pain in her left knee when she walks as well.  Feels like her left knee pops when she walks but has not noticed any decreased ROM, recent trauma to the area, errythema/inflammation.  Knee also hurts when she uses it.  States she took 4 doses of an arthrits OTC med but it was not helpful so she stopped.  Pt denies any weakness, numbness or tingling in her legs.  Also denies any recent CP, SOB, palpitations, DOE, PND, or changes in bowel or bladder  function.    Preventive Screening-Counseling & Management  Alcohol-Tobacco     Smoking Status: quit     Year Quit: 1998  Caffeine-Diet-Exercise     Does Patient Exercise: yes  Current Medications (verified): 1)  Lotensin 20 Mg Tabs (Benazepril Hcl) .... Take 1 Tablet By Mouth Once A Day 2)  Hydrochlorothiazide 25 Mg Tabs (Hydrochlorothiazide) .... Take 1 Tablet By Mouth Once A Day 3)  Aspirin 81 Mg Tbec (Aspirin) .... Take 1 Tablet By Mouth Once A Day 4)  Neurontin 300 Mg Caps (Gabapentin) .... Take 1 Capsule By Mouth At Bedtime 5)  Simvastatin 40 Mg Tabs (Simvastatin) .... Take 1 Tablet By Mouth At Bedtime. 6)  Metformin Hcl 500 Mg Tabs (Metformin Hcl) .... Take 1 Tablet By Mouth Once A Day 7)  Paxil 20 Mg  Tabs (Paroxetine Hcl) 8)  Nasonex 50 Mcg/act Susp (Mometasone Furoate) .... Spray in Each Nostril Once A Day. 9)  Zyrtec Allergy 10 Mg Tabs (Cetirizine Hcl) .... Take 1 Tablet By Mouth Once A Day 10)  Vitamin D3 1000 Unit Caps (Cholecalciferol) .... Take 2 Tablets By Mouth Daily 11)  Calcium Carbonate 600 Mg Tabs (Calcium Carbonate) .... Take One Tab Twice Daily 12)  Acetaminophen 500 Mg Tabs (Acetaminophen) .... Take One Tab Up  To 6 Times Daily For Pain. 13)  Ibuprofen 200 Mg Tabs (Ibuprofen) .... Take One Tab Three Times Daily  Allergies (verified): No Known Drug Allergies  Past History:  Past Medical History: Last updated: 01/16/2007 Diabetes mellitus, type II Hyperlipidemia Hypertension Low back pain Cervical spondylosis Obesity Depression    - followed by Dr. Allyne Gee    - no longer on Depakote, Seroquel Chronic serous otitis media Foot drop Constipation Paraumbulical hernia Hernia MVI 02/20/06  Past Surgical History: Last updated: 12/05/2005 Cholecystectomy S/P hernia repair 1999  Family History: Last updated: 01/16/2007 No colon CA.  Social History: Last updated: 02/16/2006 Used to smoke in past quit 1998 Lives alone in [place]  Review of  Systems      See HPI  Physical Exam  General:  NAD, A&Ox3, Obese Lungs:  normal respiratory effort, normal breath sounds, no crackles, and no wheezes.   Heart:  normal rate, regular rhythm, no murmur, no gallop, and no rub.   Extremities:  No focal swelling noted in any extremity.  No joint deformity noted either.  Arms and legs appear symmetric in terms of size.  Left knee w/o effusions, errythema, or TTP.  Has full ROM and I do not appreaciate any locking.  However does have crepitus on both knees.  Trace edema bilaterally in LE.     Impression & Recommendations:  Problem # 1:  DEGENERATIVE JOINT DISEASE (ICD-715.90) Based on exam and history, her leg pain is most likely to be osteoarthritis due to a combination of overuse and obesity.  Will check x-ray of left knee and start consevative management with tylenol and NSAID.  Will also start calcium.  Given compression stockings for dependent LE edema.  Will reassess symptoms at next visit and push for better weight control with diet and exercise as tolerated.    Orders: Diagnostic X-Ray/Fluoroscopy (Diagnostic X-Ray/Flu)  Problem # 2:  HYPERTENSION (ICD-401.9) BP shows excellent control so will not alter meds at this time.   Her updated medication list for this problem includes:    Lotensin 20 Mg Tabs (Benazepril hcl) .Marland Kitchen... Take 1 tablet by mouth once a day    Hydrochlorothiazide 25 Mg Tabs (Hydrochlorothiazide) .Marland Kitchen... Take 1 tablet by mouth once a day  BP today: 123/82 Prior BP: 137/69 (05/12/2008)  Prior 10 Yr Risk Heart Disease: 17 % (01/16/2007)  Labs Reviewed: K+: 4.1 (02/10/2008) Creat: : 0.84 (02/10/2008)   Chol: 147 (02/10/2008)   HDL: 47 (02/10/2008)   LDL: 88 (02/10/2008)   TG: 60 (02/10/2008)  Problem # 3:  HYPERLIPIDEMIA (ICD-272.4) Last LDL was 88 which indicates good control.  Will continue with statin and check LFT's at next visit.   Her updated medication list for this problem includes:    Simvastatin 40 Mg Tabs  (Simvastatin) .Marland Kitchen... Take 1 tablet by mouth at bedtime.  Labs Reviewed: SGOT: 18 (02/10/2008)   SGPT: 12 (02/10/2008)  Prior 10 Yr Risk Heart Disease: 17 % (01/16/2007)   HDL:47 (02/10/2008), 48 (01/16/2007)  LDL:88 (02/10/2008), 92 (01/16/2007)  Chol:147 (02/10/2008), 153 (01/16/2007)  Trig:60 (02/10/2008), 63 (01/16/2007)  Complete Medication List: 1)  Lotensin 20 Mg Tabs (Benazepril hcl) .... Take 1 tablet by mouth once a day 2)  Hydrochlorothiazide 25 Mg Tabs (Hydrochlorothiazide) .... Take 1 tablet by mouth once a day 3)  Aspirin 81 Mg Tbec (Aspirin) .... Take 1 tablet by mouth once a day 4)  Neurontin 300 Mg Caps (Gabapentin) .... Take 1 capsule by mouth at bedtime 5)  Simvastatin 40 Mg Tabs (  Simvastatin) .... Take 1 tablet by mouth at bedtime. 6)  Metformin Hcl 500 Mg Tabs (Metformin hcl) .... Take 1 tablet by mouth once a day 7)  Paxil 20 Mg Tabs (Paroxetine hcl) 8)  Nasonex 50 Mcg/act Susp (Mometasone furoate) .... Spray in each nostril once a day. 9)  Zyrtec Allergy 10 Mg Tabs (Cetirizine hcl) .... Take 1 tablet by mouth once a day 10)  Vitamin D3 1000 Unit Caps (Cholecalciferol) .... Take 2 tablets by mouth daily 11)  Calcium Carbonate 600 Mg Tabs (Calcium carbonate) .... Take one tab twice daily 12)  Acetaminophen 500 Mg Tabs (Acetaminophen) .... Take one tab up to 6 times daily for pain. 13)  Ibuprofen 200 Mg Tabs (Ibuprofen) .... Take one tab three times daily  Patient Instructions: 1)  Please schedule a follow-up appointment in 2 months. 2)  We will call you for your x-ray appointment. 3)  Do not take more than 4grams of tylenol per day.   Prescriptions: IBUPROFEN 200 MG TABS (IBUPROFEN) Take one tab three times daily  #90 x 3   Entered and Authorized by:   Lucy Antigua MD   Signed by:   Lucy Antigua MD on 06/30/2008   Method used:   Electronically to        [facility] [address] [phone]* (retail)       [address]       [place], [place]  [address]       Ph:  [phone]       Fax: [phone]   RxID:   [id number] ACETAMINOPHEN 500 MG TABS (ACETAMINOPHEN) Take one tab up to 6 times daily for pain.  #60 x 5   Entered and Authorized by:   Lucy Antigua MD   Signed by:   Lucy Antigua MD on 06/30/2008   Method used:   Electronically to        [facility] [address] [phone]* (retail)       [address]       [place], [place]  [address]       Ph: [phone]       Fax: [phone]   RxID:   [id number] CALCIUM CARBONATE 600 MG TABS (CALCIUM CARBONATE) Take one tab twice daily  #60 x 6   Entered and Authorized by:   Lucy Antigua MD   Signed by:   Lucy Antigua MD on 06/30/2008   Method used:   Electronically to        [facility] [address] [phone]* (retail)       [address]       [place], [place]  [address]       Ph: [phone]       Fax: [phone]   RxID:   [id number] IBUPROFEN 200 MG TABS (IBUPROFEN) Take one tab three times daily  #90 x 3   Entered and Authorized by:   Lucy Antigua MD   Signed by:   Lucy Antigua MD on 06/30/2008   Method used:   Electronically to        [facility] [address] [phone]* (retail)       [address]       [place], [place]  [address]       Ph: [phone]       Fax: [phone]   RxID:   [id number] ACETAMINOPHEN 500 MG TABS (ACETAMINOPHEN) Take one tab up to 6 times daily for pain.  #60 x 5   Entered and Authorized by:  Lucy Antigua MD   Signed by:   Lucy Antigua MD on 06/30/2008   Method used:   Electronically to        [facility] [address] [phone]* (retail)       [address]       [place], [place]  [address]       Ph: [phone]       Fax: [phone]   RxID:   [id number] CALCIUM CARBONATE 600 MG TABS (CALCIUM CARBONATE) Take one tab twice daily  #60 x 6   Entered and Authorized by:   Lucy Antigua MD   Signed by:   Lucy Antigua MD on 06/30/2008   Method used:   Electronically to        [facility] [address] [phone]* (retail)       [address]       [place], [place]  [address]       Ph: [phone]       Fax: [phone]   RxID:   [id number]

## 2010-02-15 NOTE — Progress Notes (Signed)
Summary: refill/ hla  Phone Note Refill Request Message from:  Fax from Pharmacy on April 03, 2006 1:58 PM  Refills Requested: Medication #1:  ZOCOR 40 MG TABS Take 1 tablet by mouth at bedtime   Last Refilled: 02/09/2006 Initial call taken by: Marin Roberts RN,  April 03, 2006 1:59 PM  Follow-up for Phone Call        Refill approved-nurse to complete.  Pt needs CMET for further refills.   Follow-up by: Manning Charity MD,  April 03, 2006 2:55 PM  Additional Follow-up for Phone Call Additional follow up Details #1::        Rx faxed to pharmacy. flag to [facility] for appt Additional Follow-up by: Marin Roberts RN,  April 03, 2006 3:31 PM  New Problems: AFTERCARE, LONG-TERM USE, MEDICATIONS NEC (ICD-V58.69)  New Problems: AFTERCARE, LONG-TERM USE, MEDICATIONS NEC (ICD-V58.69)  Prescriptions: ZOCOR 40 MG TABS (SIMVASTATIN) Take 1 tablet by mouth at bedtime  #31 x 0   Entered and Authorized by:   Manning Charity MD   Signed by:   Manning Charity MD on 04/03/2006   Method used:   Telephoned to ...       [facility] [address]       [address]       [place], [place]  [address]  [place]       Ph: [phone]       Fax: [phone]   RxID:   [id number]

## 2010-02-15 NOTE — Progress Notes (Signed)
Summary: refill/ hla  Phone Note Refill Request Message from:  Patient on February 24, 2009 12:30 PM  Refills Requested: Medication #1:  NASONEX 50 MCG/ACT SUSP Spray in each nostril once a day. Initial call taken by: Marin Roberts RN,  February 24, 2009 12:31 PM  Follow-up for Phone Call       Follow-up by: Darnelle Maffucci MD,  February 24, 2009 2:14 PM    Prescriptions: NASONEX 50 MCG/ACT SUSP (MOMETASONE FUROATE) Spray in each nostril once a day.  #1 x 5   Entered and Authorized by:   Darnelle Maffucci MD   Signed by:   Darnelle Maffucci MD on 02/24/2009   Method used:   Electronically to        [facility] [address] [phone]* (retail)       [address]       [place], [place]  [address]       Ph: [phone]       Fax: [phone]   RxID:   [id number]

## 2010-02-15 NOTE — Assessment & Plan Note (Signed)
Summary: ACUTE-MEDICATION REFILLS PER DEAN'NA   Vital Signs:  Patient Profile:   60 Years Old Female Height:     61.75 inches (156.85 cm) Weight:      197.6 pounds (89.82 kg) BMI:     36.57 Temp:     98.3 degrees F (36.83 degrees C) oral Pulse rate:   80 / minute BP sitting:   123 / 76  (right arm)  Pt. in pain?   yes    Location:   upper chest    Intensity:   9  Vitals Entered By: Stanton Kidney Ditzler RN (August 31, 2006 3:05 PM)              Is Patient Diabetic? Yes  Nutritional Status BMI of > 30 = obese Nutritional Status Detail good CBG Result 87  Have you ever been in a relationship where you felt threatened, hurt or afraid?denies   Does patient need assistance? Functional Status Self care Ambulation Normal   PCP:  Shannan Harper  Chief Complaint:  Ck - up. Needs labs for Dr Allyne Gee. Not sleeping for past month..  History of Present Illness: Pt is a 60 yo woman with a hx of DM, HTN and HL who presents for med refills.  Pt needs a Depakote level and LFTs drawn. Goes to Mental Health for depression - is doing well if she takes her medications. No suicidal thoughts. Sees Dr. Allyne Gee. Was recently started on depakote (about 1 week ago). Pt explains that when she sits down, she gets "tired" (points to her throat and says that it's probably coming from her sinuses). When she is doing physical activity, she has no dyspnea. No dyspnea on exertion, no cough. Has seasonal allergies and thinks her sinuses might be draining. Eyes heavy sometimes. Takes nothing for her allergies. Occasional h/a with sinus congestion. Denies fevers and chills.    Diabetes Management History:      Hypoglycemic symptoms are not occurring.  No hyperglycemic symptoms are reported.    Hypertension History:      She denies chest pain, palpitations, dyspnea with exertion, orthopnea, PND, peripheral edema, visual symptoms, and syncope.        Positive major cardiovascular risk factors include female age 62  years old or older, diabetes, hyperlipidemia, and hypertension.  Negative major cardiovascular risk factors include non-tobacco-user status.      Current Allergies (reviewed today): No known allergies   Past Medical History:    Diabetes mellitus, type II    Hyperlipidemia    Hypertension    Low back pain    Cervical spondylosis    Obesity    Depression       - followed by Dr. Allyne Gee       - started on Depakote (08/2006)    Chronic serous otitis media    Foot drop    Constipation    Paraumbulical hernia Hernia    MVI 02/20/06    Risk Factors: Tobacco use:  quit    Year quit:  1998   Review of Systems      See HPI  General      Denies chills and fever.  GI      Denies abdominal pain, bloody stools, constipation, dark tarry stools, nausea, and vomiting.      Alternates b/w diarrhea and constipation.  GU      Denies dysuria and hematuria.   Physical Exam  General:     alert, well-developed, well-nourished, and well-hydrated.   Head:  atraumatic.   Eyes:     vision grossly intact, pupils equal, pupils round, and pupils reactive to light.  No tearing or scleral injection. Mouth:     OP clear, MMM. No post nasal drip. Neck:     Supple, no lymphadnp/tm. Lungs:     CTAB with good air mvt. Heart:     RRR, no m/r/g. Abdomen:     BS+, soft, NT, ND, no palpable masses. Pulses:     2+ pedal pulses bilaterally. Extremities:     No edema, cyanosis or clubbing. Neurologic:     alert & oriented X3, cranial nerves II-XII intact, strength normal in all extremities, and gait normal.      Impression & Recommendations:  Problem # 1:  DEPRESSION (ICD-311) Managed pharmacotx by Dr. Allyne Gee. Started on Depakote. Pt doing well. Not suicidal or homicidal. Will monitor depakote level and LFTs.  Her updated medication list for this problem includes:    Prozac 20 Mg Caps (Fluoxetine hcl) .Marland Kitchen... Take 3 capsules by mouth every morning  *Send lab reports to Dr.  Allyne Gee. Orders: T- * Misc. Laboratory test [phone])   Problem # 2:  ALLERGIC RHINITIS, SEASONAL (ICD-477.0) Gave pt a prescription for Claritin D and a nasal saline spray for sx mgmt.  Problem # 3:  DIABETES MELLITUS, TYPE II (ICD-250.00) Pt's A1c is 6.0 while being on only 500 mg of glucophage once daily. I am actually wondering whether she even needs to be on metformin. Will reassess at her next f/u.  Her updated medication list for this problem includes:    Lotensin 20 Mg Tabs (Benazepril hcl) .Marland Kitchen... Take 1 tablet by mouth once a day    Aspirin 81 Mg Tbec (Aspirin) .Marland Kitchen... Take 1 tablet by mouth once a day    Glucophage 500 Mg Tabs (Metformin hcl) ..... One tablet daily  Orders: T- Capillary Blood Glucose ([address]) T-Hgb A1C (in-house) ([id number])  Labs Reviewed: HgBA1c: 6.0 (08/31/2006)   Creat: 1.00 (04/27/2006)      Problem # 4:  HYPERTENSION (ICD-401.9) BP at goal. On ACEI which is great since she has DM.  Her updated medication list for this problem includes:    Lotensin 20 Mg Tabs (Benazepril hcl) .Marland Kitchen... Take 1 tablet by mouth once a day    Hydrochlorothiazide 25 Mg Tabs (Hydrochlorothiazide) .Marland Kitchen... Take 1 tablet by mouth once a day  Orders: T-Comprehensive Metabolic Panel ([id number])  BP today: 123/76 Prior BP: 122/79 (02/26/2006)  Labs Reviewed: Creat: 1.00 (04/27/2006) Chol: 155 (03/23/2006)   HDL: 48 (03/23/2006)   LDL: 93 (03/23/2006)   TG: 68 (03/23/2006)   Problem # 5:  HYPERLIPIDEMIA (ICD-272.4) Last LDL 93 which is at goal of <100. Will check LFTs today.  Her updated medication list for this problem includes:    Zocor 40 Mg Tabs (Simvastatin) .Marland Kitchen... Take 1 tablet by mouth at bedtime  Future Orders: T-Lipid Profile ([id number]) ... 09/07/2006  Labs Reviewed: Chol: 155 (03/23/2006)   HDL: 48 (03/23/2006)   LDL: 93 (03/23/2006)   TG: 68 (03/23/2006) SGOT: 23 (04/27/2006)   SGPT: 20 (04/27/2006)   Complete Medication List: 1)  Lotensin 20 Mg Tabs  (Benazepril hcl) .... Take 1 tablet by mouth once a day 2)  Hydrochlorothiazide 25 Mg Tabs (Hydrochlorothiazide) .... Take 1 tablet by mouth once a day 3)  Aspirin 81 Mg Tbec (Aspirin) .... Take 1 tablet by mouth once a day 4)  Neurontin 300 Mg Caps (Gabapentin) .... Take 1 capsule by mouth at bedtime 5)  Prozac  20 Mg Caps (Fluoxetine hcl) .... Take 3 capsules by mouth every morning 6)  Metamucil Powd (Psyllium powd) .... Use as directed 7)  Zocor 40 Mg Tabs (Simvastatin) .... Take 1 tablet by mouth at bedtime 8)  Glucophage 500 Mg Tabs (Metformin hcl) .... One tablet daily 9)  Claritin-d 24 Hour 10-240 Mg Tb24 (Loratadine-pseudoephedrine) .... Take 1 tablet by mouth once a day 10)  Nasal Saline 0.65 % Soln (Saline) .... 2 sprays in each nostril 1 to 2 times daily 11)  Depakote 125 Mg Tbec (Divalproex sodium)  Diabetes Management Assessment/Plan:      The following lipid goals have been established for the patient: Total cholesterol goal of 200; LDL cholesterol goal of 100; HDL cholesterol goal of 40; Triglyceride goal of 200.    Hypertension Assessment/Plan:      The patient's hypertensive risk group is category C: Target organ damage and/or diabetes.  Her calculated 10 year risk of coronary heart disease is 13 %.  Today's blood pressure is 123/76.     Patient Instructions: 1)  Please schedule a follow-up appointment in 1 month. 2)  Please return for a FASTING Lipid Profile one (1) week before your next appointment as scheduled.    Prescriptions: NASAL SALINE 0.65 % SOLN (SALINE) 2 sprays in each nostril 1 to 2 times daily  #1 x 2   Entered and Authorized by:   Olene Craven MD   Signed by:   Olene Craven MD on 08/31/2006   Method used:   Electronically sent to ...       [facility] [address]       [address]       [place], [place]  [address]       Ph: [phone]       Fax: [phone]   RxID:   [id number] CLARITIN-D 24 HOUR 10-240 MG TB24  (LORATADINE-PSEUDOEPHEDRINE) Take 1 tablet by mouth once a day  #30 x 2   Entered and Authorized by:   Olene Craven MD   Signed by:   Olene Craven MD on 08/31/2006   Method used:   Electronically sent to ...       [facility] [address]*       [address]       [place], [place]  [address]       Ph: [phone]       Fax: [phone]   RxID:   [id number] GLUCOPHAGE 500 MG TABS (METFORMIN HCL) One tablet daily  #30 x 11   Entered and Authorized by:   Olene Craven MD   Signed by:   Olene Craven MD on 08/31/2006   Method used:   Electronically sent to ...       [facility] [address]       [address]       [place], [place]  [address]       Ph: [phone]       Fax: [phone]   RxID:   [id number] ZOCOR 40 MG TABS (SIMVASTATIN) Take 1 tablet by mouth at bedtime  #30 x 11   Entered and Authorized by:   Olene Craven MD   Signed by:   Olene Craven MD on 08/31/2006   Method used:   Electronically sent to ...       [facility] [phone]  [address]       [address]       [place], [place]  [address]  Ph: [phone]       Fax: [phone]   RxID:   [id number] HYDROCHLOROTHIAZIDE 25 MG TABS (HYDROCHLOROTHIAZIDE) Take 1 tablet by mouth once a day  #30 x 11   Entered and Authorized by:   Olene Craven MD   Signed by:   Olene Craven MD on 08/31/2006   Method used:   Electronically sent to ...       [facility] [address]*       [address]       [place], [place]  [address]       Ph: [phone]       Fax: [phone]   RxID:   [id number] LOTENSIN 20 MG TABS (BENAZEPRIL HCL) Take 1 tablet by mouth once a day  #30 x 11   Entered and Authorized by:   Olene Craven MD   Signed by:   Olene Craven MD on 08/31/2006   Method used:   Electronically sent to ...       [facility] [address]*       [address]       [place], [place]  [address]       Ph: [phone]       Fax: [phone]   RxID:    [id number]        Vital Signs:  Patient Profile:   60 Years Old Female Height:     61.75 inches (156.85 cm) Weight:      197.6 pounds (89.82 kg) BMI:     36.57 Temp:     98.3 degrees F (36.83 degrees C) oral Pulse rate:   80 / minute BP sitting:   123 / 76    Location:   upper chest    Intensity:   9             CBG Result 87     Laboratory Results   Blood Tests   Date/Time Recieved: August 31, 2006 3:25 PM  Date/Time Reported: ..................................................................Marland KitchenAlric Quan  August 31, 2006 3:26 PM   HGBA1C: 6.0%   (Normal Range: Non-Diabetic - 3-6%   Control Diabetic - 6-8%) CBG Random: 87

## 2010-02-15 NOTE — Progress Notes (Signed)
Summary: phone/gg  Phone Note Call from Patient   Caller: Patient Summary of Call: Pt would like a note written to  her insurance company, [employer].  It needs to state why she needs a parital plate to lower teeth. She needs it to chew food.  Without it she can't swollow because of size of food. Pt saw DDS today and needs this letter asap so they can get started. Call pt when ready [phone] Initial call taken by: Merrie Roof RN,  September 04, 2007 2:49 PM

## 2010-02-15 NOTE — Assessment & Plan Note (Signed)
Summary: est-ck/fu/cfb   Vital Signs:  Patient Profile:   60 Years Old Female Height:     61.75 inches (156.85 cm) Weight:      197.0 pounds (89.55 kg) BMI:     36.46 Temp:     98.2 degrees F (36.78 degrees C) oral Pulse rate:   87 / minute BP sitting:   132 / 80  (right arm)  Pt. in pain?   no  Vitals Entered By: Filomena Jungling NT II (February 10, 2008 1:56 PM)              Is Patient Diabetic? No Nutritional Status BMI of 25 - 29 = overweight CBG Result 82  Have you ever been in a relationship where you felt threatened, hurt or afraid?No   Does patient need assistance? Functional Status Self care Ambulation Normal     PCP:  Olene Craven MD  Chief Complaint:  CHECK-UP.  History of Present Illness: Mariah Henderson is a 60 y/o woman with DM, HTN and HLPD presenting to the opc to get blood work: she wants me to monitor her cholesterol and diabetes because she ate a lot during the holidays. She has been doing well otherwise and has no active complaints.    Prior Medication List:  LOTENSIN 20 MG TABS (BENAZEPRIL HCL) Take 1 tablet by mouth once a day HYDROCHLOROTHIAZIDE 25 MG TABS (HYDROCHLOROTHIAZIDE) Take 1 tablet by mouth once a day ASPIRIN 81 MG TBEC (ASPIRIN) Take 1 tablet by mouth once a day NEURONTIN 300 MG CAPS (GABAPENTIN) Take 1 capsule by mouth at bedtime METAMUCIL  POWD (PSYLLIUM POWD) Use as directed ZOCOR 40 MG TABS (SIMVASTATIN) Take 1 tablet by mouth at bedtime GLUCOPHAGE 500 MG TABS (METFORMIN HCL) One tablet daily CLARITIN-D 24 HOUR 10-240 MG TB24 (LORATADINE-PSEUDOEPHEDRINE) Take 1 tablet by mouth once a day NASAL SALINE 0.65 % SOLN (SALINE) 2 sprays in each nostril 1 to 2 times daily PAXIL 20 MG  TABS (PAROXETINE HCL)    Current Allergies: No known allergies     Risk Factors: Tobacco use:  quit    Year quit:  1998 Exercise:  yes  Colonoscopy History:    Date of Last Colonoscopy:  03/29/2007   Review of Systems  General      Denies  chills, fatigue, and fever.  Eyes      Denies blurring and double vision.  ENT      Denies nasal congestion and postnasal drainage.  CV      Denies chest pain or discomfort, difficulty breathing at night, difficulty breathing while lying down, palpitations, shortness of breath with exertion, and swelling of feet.  Resp      Complains of cough.      Denies coughing up blood, shortness of breath, sputum productive, and wheezing.  GI      Denies bloody stools, change in bowel habits, dark tarry stools, nausea, and vomiting.  GU      Denies dysuria and hematuria.  MS      Denies joint redness and joint swelling.  Derm      Denies itching, lesion(s), and rash.  Neuro      Denies falling down, headaches, and numbness.  Psych      Denies anxiety and depression.  Endo      Denies excessive hunger, excessive thirst, excessive urination, and heat intolerance.  Heme      Denies abnormal bruising and bleeding.  Allergy      Denies seasonal allergies and sneezing.  Physical Exam  General:     alert, well-developed, well-nourished, and well-hydrated.  Overweight elderly woman in NAD. Head:     atraumatic.   Eyes:     vision grossly intact, pupils equal, pupils round, and pupils reactive to light.   Ears:     no external deformities.   Nose:     no external deformity.   Mouth:     OP clear, MMM. Neck:     supple, full ROM, and no masses.   Lungs:     normal respiratory effort, no intercostal retractions, no accessory muscle use, and normal breath sounds.   Heart:     normal rate, regular rhythm, and no murmur.   Abdomen:     soft, non-tender, and normal bowel sounds.   Extremities:     No e/c/c. Neurologic:     alert & oriented X3, cranial nerves II-XII intact, strength normal in all extremities, sensation intact to light touch, and gait normal.   Skin:     no rashes and no suspicious lesions.   Psych:     Oriented X3, memory intact for recent and remote,  normally interactive, good eye contact, and not anxious appearing.      Impression & Recommendations:  Problem # 1:  DIABETES MELLITUS, TYPE II (ICD-250.00) Well controlled. Cont. current regimen.  Her updated medication list for this problem includes:    Lotensin 20 Mg Tabs (Benazepril hcl) .Marland Kitchen... Take 1 tablet by mouth once a day    Aspirin 81 Mg Tbec (Aspirin) .Marland Kitchen... Take 1 tablet by mouth once a day    Glucophage 500 Mg Tabs (Metformin hcl) ..... One tablet daily  Labs Reviewed: HgBA1c: 6.4 (06/21/2007)   Creat: 0.86 (08/31/2006)     Orders: T-Comprehensive Metabolic Panel [id number]) T-Lipid Profile [id number]) T- Capillary Blood Glucose ([id number])  Orders: T-Comprehensive Metabolic Panel ([id number]) T-Lipid Profile ([id number])   Problem # 2:  HYPERTENSION (ICD-401.9) BP well controlled on current.  Her updated medication list for this problem includes:    Lotensin 20 Mg Tabs (Benazepril hcl) .Marland Kitchen... Take 1 tablet by mouth once a day    Hydrochlorothiazide 25 Mg Tabs (Hydrochlorothiazide) .Marland Kitchen... Take 1 tablet by mouth once a day  BP today: 132/80 Prior BP: 135/85 (09/24/2007)  Prior 10 Yr Risk Heart Disease: 17 % (01/16/2007)  Labs Reviewed: Creat: 0.86 (08/31/2006) Chol: 153 (01/16/2007)   HDL: 48 (01/16/2007)   LDL: 92 (01/16/2007)   TG: 63 (01/16/2007)  Orders: T-Comprehensive Metabolic Panel [id number]) T-Lipid Profile ([id number])  Orders: T-Comprehensive Metabolic Panel ([id number]) T-Lipid Profile ([id number])   Problem # 3:  HYPERLIPIDEMIA (ICD-272.4) Needs a FLP.  Her updated medication list for this problem includes:    Zocor 40 Mg Tabs (Simvastatin) .Marland Kitchen... Take 1 tablet by mouth at bedtime  Labs Reviewed: Chol: 153 (01/16/2007)   HDL: 48 (01/16/2007)   LDL: 92 (01/16/2007)   TG: 63 (01/16/2007) SGOT: 16 (08/31/2006)   SGPT: 11 (08/31/2006)  Prior 10 Yr Risk Heart Disease: 17 % (01/16/2007)  Orders: T-Comprehensive Metabolic Panel  ([id number]) T-Lipid Profile [id number])  Orders: T-Comprehensive Metabolic Panel ([id number]) T-Lipid Profile ([id number])   Problem # 4:  ALLERGIC RHINITIS DUE TO OTHER ALLERGEN (ICD-477.8) Well controlled.  Problem # 5:  DEPRESSION (ICD-311) Well controlled. goes to mental health. No suicidal ideation.  Her updated medication list for this problem includes:    Paxil 20 Mg Tabs (Paroxetine hcl)   Complete Medication List: 1)  Lotensin 20 Mg Tabs (Benazepril hcl) .... Take 1 tablet  by mouth once a day 2)  Hydrochlorothiazide 25 Mg Tabs (Hydrochlorothiazide) .... Take 1 tablet by mouth once a day 3)  Aspirin 81 Mg Tbec (Aspirin) .... Take 1 tablet by mouth once a day 4)  Neurontin 300 Mg Caps (Gabapentin) .... Take 1 capsule by mouth at bedtime 5)  Zocor 40 Mg Tabs (Simvastatin) .... Take 1 tablet by mouth at bedtime 6)  Glucophage 500 Mg Tabs (Metformin hcl) .... One tablet daily 7)  Claritin-d 24 Hour 10-240 Mg Tb24 (Loratadine-pseudoephedrine) .... Take 1 tablet by mouth once a day 8)  Paxil 20 Mg Tabs (Paroxetine hcl) 9)  Nasonex 50 Mcg/act Susp (Mometasone furoate) .... Spray in each nostril once a day.   Patient Instructions: 1)  I will give you a call with the results of your bloodwork. 2)  Your blood pressure is doing great ! 3)  Please schedule a follow-up appointment in 6 months to a year as needed.   Prescriptions: NASONEX 50 MCG/ACT SUSP (MOMETASONE FUROATE) Spray in each nostril once a day.  #1 x 5   Entered and Authorized by:   Olene Craven MD   Signed by:   Olene Craven MD on 02/10/2008   Method used:   Electronically to        [facility] [address] [phone]* (retail)       [address]       [place], [place]  [address]       Ph: [phone]       Fax: [phone]   RxID:   [id number]   Laboratory Results   Blood Tests   Date/Time Received: February 10, 2008 2:39 PM Date/Time Reported: Alric Quan  February 10, 2008 2:40  PM   CBG Random:: 82mg /dL

## 2010-02-15 NOTE — Assessment & Plan Note (Signed)
Summary: ACUTE-ER/FU/MESD/CFB([facility])   Vital Signs:  Patient profile:   60 year old female Height:      61.75 inches (156.85 cm) Weight:      202.06 pounds (91.85 kg) BMI:     37.39 Temp:     98.0 degrees F (36.67 degrees C) oral Pulse rate:   85 / minute Pulse (ortho):   80 / minute BP sitting:   115 / 71  (left arm) BP standing:   137 / 69 Cuff size:   large  Vitals Entered By: Hoy Morn SMA (May 12, 2008 9:41 AM)  Serial Vital Signs/Assessments:  Time      Position  BP       Pulse  Resp  Temp     By 11:10 AM  Lying LA  136/75                         Chelsea Fogleman SMA 11:10 AM  Sitting   131/65   87                    Chelsea Fogleman SMA 11:11 AM  Standing  137/69   80                    Chelsea Fogleman SMA  CC: FU on ED/pt. states she feels weak and tired, Depression Is Patient Diabetic? Yes  Pain Assessment Patient in pain? no      Nutritional Status BMI of > 30 = obese Nutritional Status Detail appt.-good CBG Result 154  Does patient need assistance? Functional Status Self care Ambulation Normal   Primary Care Provider:  Olene Craven MD  CC:  FU on ED/pt. states she feels weak and tired and Depression.  History of Present Illness: This is a 60   y/o woman with PMH of  Diabetes mellitus, type II Hyperlipidemia Hypertension Low back pain Cervical spondylosis Obesity Depression    - followed by Dr. Allyne Gee    - no longer on Depakote, Seroquel Chronic serous otitis media Foot drop Constipation Paraumbulical hernia Hernia MVI 02/20/06 presenting for f/u from [facility] 04/25. She was seen here and diagnosed with sinusitis. She took Zithromax and finished the course. She still feels weak especially when stands up. She had normal labs in ED 04/25.  She is bipolar and seen by [facility] last month (seen q 3 months) and no meds were changed.  She has no CP but has SOB (mostly exertional). She feels some dizziness with standing, too. She gained  weight, too. She feels too weak to walk. She has been walking regularly until now. The weakness started 2 weeks ago along with the URI.  CBGs at home usually around 100.   Depression History:      The patient denies a depressed mood most of the day and a diminished interest in her usual daily activities.         Preventive Screening-Counseling & Management     Smoking Status: quit     Year Quit: 1998     Does Patient Exercise: yes  Current Medications (verified): 1)  Lotensin 20 Mg Tabs (Benazepril Hcl) .... Take 1 Tablet By Mouth Once A Day 2)  Hydrochlorothiazide 25 Mg Tabs (Hydrochlorothiazide) .... Take 1 Tablet By Mouth Once A Day 3)  Aspirin 81 Mg Tbec (Aspirin) .... Take 1 Tablet By Mouth Once A Day 4)  Neurontin 300 Mg Caps (Gabapentin) .... Take 1 Capsule By Mouth  At Bedtime 5)  Zocor 40 Mg Tabs (Simvastatin) .... Take 1 Tablet By Mouth At Bedtime 6)  Glucophage 500 Mg Tabs (Metformin Hcl) .... One Tablet Daily 7)  Paxil 20 Mg  Tabs (Paroxetine Hcl) 8)  Nasonex 50 Mcg/act Susp (Mometasone Furoate) .... Spray in Each Nostril Once A Day. 9)  Zyrtec Allergy 10 Mg Tabs (Cetirizine Hcl) .... Take 1 Tablet By Mouth Once A Day  Allergies: No Known Drug Allergies  Review of Systems       per HPI, OTW neg.  Physical Exam  General:  alert and overweight-appearing.   Head:  no sinus point tenderness Eyes:  vision grossly intact, pupils equal, pupils round, and pupils reactive to light.   Ears:  R ear normal, with minimal cerumen.   L ear full of cerumen (advised for Debrox) Mouth:  pharynx pink and moist.   Neck:  no LAD Lungs:  normal respiratory effort, no crackles, and no wheezes.   Heart:  normal rate, regular rhythm, no murmur, no gallop, and no rub.   Extremities:  trace left and right pedal edema, nonpitting.   Neurologic:  strength normal in all extremities.   Skin:  no rashes, no suspicious lesions, no ecchymoses, and no petechiae.   Psych:  Oriented X3 and normally  interactive.     Impression & Recommendations:  Problem # 1:  WEAKNESS (ICD-780.79) Assessment Improved Sinusitis is resolving but still feels weak.  - recent CBC, CMET, U/A nl. CXR with RML scarring vs developping infiltrate (doubt) s/p ABx.  - I will start her on vitamin D.  - Will also check orthostatics as she feels weak mostly when stands.  - will check EKG - This might be lingering URI, give Mucinex - advised her to call us in 7 days-1 week if not better.   Addendum:  EKG normal. Pt not orthostatic.  Complete Medication List: 1)  Lotensin 20 Mg Tabs (Benazepril hcl) .... Take 1 tablet by mouth once a day 2)  Hydrochlorothiazide 25 Mg Tabs (Hydrochlorothiazide) .... Take 1 tablet by mouth once a day 3)  Aspirin 81 Mg Tbec (Aspirin) .... Take 1 tablet by mouth once a day 4)  Neurontin 300 Mg Caps (Gabapentin) .... Take 1 capsule by mouth at bedtime 5)  Zocor 40 Mg Tabs (Simvastatin) .... Take 1 tablet by mouth at bedtime 6)  Glucophage 500 Mg Tabs (Metformin hcl) .... One tablet daily 7)  Paxil 20 Mg Tabs (Paroxetine hcl) 8)  Nasonex 50 Mcg/act Susp (Mometasone furoate) .... Spray in each nostril once a day. 9)  Zyrtec Allergy 10 Mg Tabs (Cetirizine hcl) .... Take 1 tablet by mouth once a day 10)  Vitamin D3 1000 Unit Caps (Cholecalciferol) .... Take 2 tablets by mouth daily 11)  Mucinex For Kids 100 Mg Pack (Guaifenesin) .... Take 1 tablet by mouth two times a day  Other Orders: T- Capillary Blood Glucose ([id number]) T-Hgb A1C (in-house) ([id number])  Patient Instructions: 1)  Please schedule a follow-up appointment in 3 months. 2)  Please bring your blood sugar meter with you at next appointment. 3)  Please call and schedule a new appt if weakness not better in 1 week. 4)  Can use Debrox for ear wax. 5)  Can use Mucinex for sinusitis/ throat congestion.  Laboratory Results   Blood Tests   Date/Time Received: May 12, 2008 9:57 AM  Date/Time Reported: Oren Beckmann  May 12, 2008 9:57 AM   HGBA1C: 6.3%   (Normal Range: Non-Diabetic -  3-6%   Control Diabetic - 6-8%) CBG Random:: 154mg /dL

## 2010-02-15 NOTE — Letter (Signed)
Summary: *Referral Letter [facility])  [facility]  [address]   [place], [place] [address]   Phone: [phone]  Fax: [phone]    09/09/2007  To whom it may concern,  This letter is regarding my patient:   Mariah Henderson [address] [place], [place]  [address] Phone: [phone]  Please be advised that Ms. Renner requires a partial plate to her lower teeth in order to facilitate eating and swallowing.  Thank you for your comprehension.  Please send (mail / fax) all correspondence pertaining to this patient to:  ATTN:    Dr. Olene Craven   [facility]   [address]   [place], [place] [address]    Fax: [phone]  Please contact us if you have any further questions or need additional information.  Sincerely,     Olene Craven MD

## 2010-02-17 NOTE — Assessment & Plan Note (Signed)
Summary: ACUTE-FELL HURT KNEE/STORNG FOUL URINE OKAY TO BOOK PER GLADY...   Vital Signs:  Patient profile:   60 year old female Height:      59.5 inches (151.13 cm) Weight:      197.7 pounds (89.86 kg) BMI:     39.40 Temp:     97.7 degrees F Pulse rate:   74 / minute BP sitting:   120 / 68  (right arm) Cuff size:   large  Vitals Entered By: Dorie Rank RN (December 27, 2009 10:06 AM) CC: c/o urine having odor and burning on and off for 2-3 weeks - right leg "knot" right lower inner aspect - fell about 2 weeks ago , Is Patient Diabetic? Yes Did you bring your meter with you today? No Pain Assessment Patient in pain? yes     Location: right knee Intensity: 7 Type: aching Onset of pain  chronic - worse when fell about 2 weeks ago - "popping" at least one month Nutritional Status BMI of > 30 = obese CBG Result 115  Have you ever been in a relationship where you felt threatened, hurt or afraid?No   Does patient need assistance? Functional Status Self care Ambulation Normal   Primary Care Provider:  Darnelle Maffucci MD  CC:  c/o urine having odor and burning on and off for 2-3 weeks - right leg "knot" right lower inner aspect - fell about 2 weeks ago  and .  History of Present Illness: Mariah Henderson is a 60 year old woman with pmh significant for HTN, HLD, DM who presents to the clinic for:  1) Right knee pain - Patient states she has chronic history of knee pain secondary to arthritis. She states she fell two weeks ago, she tripped over one step and fell onto concrete and landed on left buttocks. She said she didn't experience any pain, and since she fell on the left side, she does not understand why she's experiencing pain on right side. She complains of a popping sound when she walks. She has had no other episodes of falls. No associated fevers, chills, joint swelling or redness.   2) Burning on urination - started 3 weeks ago. She states it does not burn everytime. Associated  with suprapubic pain. Denies N/V. Complains of urinary hesitancy, and dribbling, and urinary frequency.   No other complaints or concerns today.  Depression History:      The patient denies a depressed mood most of the day and a diminished interest in her usual daily activities.         Preventive Screening-Counseling & Management  Alcohol-Tobacco     Smoking Status: quit     Year Quit: 1998  Caffeine-Diet-Exercise     Does Patient Exercise: yes  Current Medications (verified): 1)  Lotensin 20 Mg Tabs (Benazepril Hcl) .... Take 1 Tablet By Mouth Once A Day 2)  Hydrochlorothiazide 25 Mg Tabs (Hydrochlorothiazide) .... Take 1 Tablet By Mouth Once A Day 3)  Aspirin 81 Mg Tbec (Aspirin) .... Take 1 Tablet By Mouth Once A Day 4)  Neurontin 300 Mg Caps (Gabapentin) .... Take 1 Capsule By Mouth At Bedtime 5)  Simvastatin 40 Mg Tabs (Simvastatin) .... Take 1 Tablet By Mouth At Bedtime. 6)  Metformin Hcl 500 Mg Tabs (Metformin Hcl) .... Take 1 Tablet By Mouth Once A Day 7)  Paxil 20 Mg  Tabs (Paroxetine Hcl) 8)  Nasonex 50 Mcg/act Susp (Mometasone Furoate) .... Spray in Each Nostril Once A Day. 9)  Zyrtec  Allergy 10 Mg Tabs (Cetirizine Hcl) .... Take 1 Tablet By Mouth Once A Day 10)  Vitamin D3 1000 Unit Caps (Cholecalciferol) .... Take 2 Tablets By Mouth Daily 11)  Calcium Carbonate 600 Mg Tabs (Calcium Carbonate) .... Take One Tab Twice Daily 12)  Acetaminophen 500 Mg Tabs (Acetaminophen) .... Take One Tab Up To 6 Times Daily For Pain. 13)  Ultram 50 Mg Tabs (Tramadol Hcl) .... Take 1 Tablet By Mouth Three Times A Day As Needed For Pain 14)  Omeprazole 20 Mg Cpdr (Omeprazole) .... Take 1 Tablet By Mouth Once A Day 15)  Prodigy Blood Glucose Monitor  Devi (Blood Glucose Monitoring Suppl) .... Use As Instructed 16)  Prodigy Blood Glucose Test  Strp (Glucose Blood) .... Use As Instructed 17)  Naproxen 500 Mg Tabs (Naproxen) .... Take 1 Tablet By Mouth Two Times A Day As Needed For Knee  Pain  Allergies (verified): No Known Drug Allergies  Past History:  Past Medical History: Last updated: 01/16/2007 Diabetes mellitus, type II Hyperlipidemia Hypertension Low back pain Cervical spondylosis Obesity Depression    - followed by Dr. Allyne Gee    - no longer on Depakote, Seroquel Chronic serous otitis media Foot drop Constipation Paraumbulical hernia Hernia MVI 02/20/06  Past Surgical History: Last updated: 12/05/2005 Cholecystectomy S/P hernia repair 1999  Family History: Last updated: 01/16/2007 No colon CA.  Social History: Last updated: 02/16/2006 Used to smoke in past quit 1998 Lives alone in [place]  Risk Factors: Exercise: yes (12/27/2009)  Risk Factors: Smoking Status: quit (12/27/2009)  Review of Systems      See HPI  Physical Exam  General:  alert and well-developed.  VS reviewed and wnl Neck:  supple.   Lungs:  normal respiratory effort and normal breath sounds.   Heart:  normal rate and regular rhythm.   Abdomen:  slight tenderness below umbilicus (suprapubic region) soft nondistended no masses  Msk:  normal ROM slight joint tenderness greater in right knee no erythema, no warmth, no swelling in leg  Extremities:  no edema noted  Neurologic:  nonfocal    Impression & Recommendations:  Problem # 1:  DEGENERATIVE JOINT DISEASE (ICD-715.90) Assessment Deteriorated Patient experiencing right knee pain. She has hx of OA in left knee and has seen Dr. Darrick Penna in the past. She was prescribed ultram, along with Tylenol. The right knee pain, is likely due to OA. Both knees appear symmetrical, no increased swelling or effusion in right knee. Pt has had ibuprofen in the past, which was changed to ultram. Will give pt a prescription for naproxen. Pt does have hx of reflux, taking omeprazole 20 mg daily. Advised her to increase to 2 tabs daily, if her reflux gets worse with naproxen. Will prescribe 2 weeks worth, and advise to continue taking  ultram and tylenol after. If the pain does not subside, will refer back to Dr. Darrick Penna for injection. Also advised non-weight bearing exercises. [facility].   Her updated medication list for this problem includes:    Aspirin 81 Mg Tbec (Aspirin) .Marland Kitchen... Take 1 tablet by mouth once a day    Acetaminophen 500 Mg Tabs (Acetaminophen) .Marland Kitchen... Take one tab up to 6 times daily for pain.    Ultram 50 Mg Tabs (Tramadol hcl) .Marland Kitchen... Take 1 tablet by mouth three times a day as needed for pain    Naproxen 500 Mg Tabs (Naproxen) .Marland Kitchen... Take 1 tablet by mouth two times a day  as needed for knee pain  Problem # 2:  DYSURIA (ICD-788.1) Assessment: New  Urine dipstick was completely negative, and her symptoms now are only intermittent. Advised her to return to clinic if her symptoms worsen. Will send urine off for UA and UC given her hx of UTIs.   Orders: T-Urinalysis ([id number]) T-Culture, Urine ([id number])  Complete Medication List: 1)  Lotensin 20 Mg Tabs (Benazepril hcl) .... Take 1 tablet by mouth once a day 2)  Hydrochlorothiazide 25 Mg Tabs (Hydrochlorothiazide) .... Take 1 tablet by mouth once a day 3)  Aspirin 81 Mg Tbec (Aspirin) .... Take 1 tablet by mouth once a day 4)  Neurontin 300 Mg Caps (Gabapentin) .... Take 1 capsule by mouth at bedtime 5)  Simvastatin 40 Mg Tabs (Simvastatin) .... Take 1 tablet by mouth at bedtime. 6)  Metformin Hcl 500 Mg Tabs (Metformin hcl) .... Take 1 tablet by mouth once a day 7)  Paxil 20 Mg Tabs (Paroxetine hcl) 8)  Nasonex 50 Mcg/act Susp (Mometasone furoate) .... Spray in each nostril once a day. 9)  Zyrtec Allergy 10 Mg Tabs (Cetirizine hcl) .... Take 1 tablet by mouth once a day 10)  Vitamin D3 1000 Unit Caps (Cholecalciferol) .... Take 2 tablets by mouth daily 11)  Calcium Carbonate 600 Mg Tabs (Calcium carbonate) .... Take one tab twice daily 12)  Acetaminophen 500 Mg Tabs (Acetaminophen) .... Take one tab up to  6 times daily for pain. 13)  Ultram 50 Mg Tabs (Tramadol hcl) .... Take 1 tablet by mouth three times a day as needed for pain 14)  Omeprazole 20 Mg Cpdr (Omeprazole) .... Take 1 tablet by mouth once a day 15)  Prodigy Blood Glucose Monitor Devi (Blood glucose monitoring suppl) .... Use as instructed 16)  Prodigy Blood Glucose Test Strp (Glucose blood) .... Use as instructed 17)  Naproxen 500 Mg Tabs (Naproxen) .... Take 1 tablet by mouth two times a day as needed for knee pain  Other Orders: Capillary Blood Glucose/CBG ([id number])  Patient Instructions: 1)  Please take naproxen twice daily as needed for knee pain, if you notice your reflux is worse, then please take two reflux pills (omeprazole) per day while taking the naproxen. 2)  After two weeks, please restart the ultram and tylenol for knee pain.  3)  Please follow up to the clinic in January 2012.  Prescriptions: NAPROXEN 500 MG TABS (NAPROXEN) Take 1 tablet by mouth two times a day as needed for knee pain  #28 x 0   Entered and Authorized by:   Melida Quitter MD   Signed by:   Melida Quitter MD on 12/27/2009   Method used:   Electronically to        [facility] [phone]* (retail)       [address]       [place], [place]  [address]       Ph: [phone]       Fax: [phone]   RxID:   [id number]    Orders Added: 1)  Capillary Blood Glucose/CBG [18841] 2)  T-Urinalysis [[id number]] 3)  T-Culture, Urine [[id number]] 4)  Est. Patient Level III [09323]   Process Orders Check Orders Results:     [facility]: ABN not required for this insurance Tests Sent for requisitioning (December 27, 2009 10:51 AM):     12/27/2009: [facility] -- T-Urinalysis [[id number]] (signed)     12/27/2009: [facility] -- T-Culture, Urine [[id number]] (signed)  Prevention & Chronic Care Immunizations   Influenza vaccine: Fluvax MCR  (10/22/2009)    Tetanus booster: 03/05/2006:  Td    Pneumococcal vaccine: Not documented    H. zoster vaccine: Not documented  Colorectal Screening   Hemoccult: Not documented   Hemoccult action/deferral: Deferred  (03/09/2009)    Colonoscopy:  Results: Several diminutive polyps in rectosigmoid.  Results: Specimen sent for pathology.    Results: Diverticulosis.   Performed by Dr. Juanda Chance.       (03/29/2007)   Colonoscopy action/deferral: Repeat colonoscopy in 7 years.   (03/29/2007)  Other Screening   Pap smear: Not documented   Pap smear action/deferral: Deferred  (03/09/2009)    Mammogram: No specific mammographic evidence of malignancy.  No significant changes compared to previous study.  Assessment: BIRADS 2. Location: [facility].    (03/05/2008)   Mammogram action/deferral: Ordered  (03/09/2009)    DXA bone density scan: Not documented   Smoking status: quit  (12/27/2009)  Diabetes Mellitus   HgbA1C: 6.5  (10/22/2009)   HgbA1C action/deferral: Ordered  (03/09/2009)    Eye exam: Not documented   Diabetic eye exam action/deferral: Ophthalmology referral  (09/18/2008)    Foot exam: yes  (03/09/2009)   Foot exam action/deferral: Do today   High risk foot: Not documented   Foot care education: Done  (03/09/2009)    Urine microalbumin/creatinine ratio: 4.8  (09/18/2008)   Urine microalbumin action/deferral: Ordered  Lipids   Total Cholesterol: 168  (10/22/2009)   Lipid panel action/deferral: Lipid Panel ordered   LDL: 109  (10/22/2009)   LDL Direct: Not documented   HDL: 48  (10/22/2009)   Triglycerides: 53  (10/22/2009)    SGOT (AST): 17  (10/22/2009)   BMP action: Ordered   SGPT (ALT): 13  (10/22/2009)   Alkaline phosphatase: 86  (10/22/2009)   Total bilirubin: 0.4  (10/22/2009)  Hypertension   Last Blood Pressure: 120 / 68  (12/27/2009)   Serum creatinine: 0.98  (10/22/2009)   BMP action: Ordered   Serum potassium 4.2  (10/22/2009)  Self-Management Support :    Personal Goals (by the next clinic visit) :     Personal A1C goal: 7  (03/09/2009)     Personal blood pressure goal: 130/80  (03/09/2009)     Personal LDL goal: 100  (03/09/2009)    Patient will work on the following items until the next clinic visit to reach self-care goals:     Medications and monitoring: take my medicines every day, examine my feet every day  (12/27/2009)     Eating: drink diet soda or water instead of juice or soda, eat foods that are low in salt, eat baked foods instead of fried foods, limit or avoid alcohol  (12/27/2009)     Activity: join a walking program  (12/27/2009)     Other: goes to [facility] when knee does not bother me  (12/27/2009)    Diabetes self-management support: Pre-printed educational material, Written self-care plan, Resources for patients handout  (12/27/2009)   Diabetes care plan printed    Hypertension self-management support: Written self-care plan, Resources for patients handout  (12/27/2009)   Hypertension self-care plan printed.    Lipid self-management support: Written self-care plan, Resources for patients handout  (12/27/2009)   Lipid self-care plan printed.      Resource handout printed.   Appended Document: ACUTE-FELL HURT KNEE/STORNG FOUL URINE OKAY TO BOOK PER GLADY...    Clinical Lists Changes  Observations: Added new observation of  COMMENTS: pt c/o burning with urination on and off for 2-3 weeks associated with "strong odor" Dorie Rank RN  December 27, 2009 11:13 AM  (12/27/2009 11:11) Added new observation of WBC DIPSTK U: negative (12/27/2009 11:11) Added new observation of UROBILINOGEN: 0.2  (12/27/2009 11:11) Added new observation of PH URINE: 5.5  (12/27/2009 11:11) Added new observation of SPEC GR URIN: >=1.030  (12/27/2009 11:11) Added new observation of BILIRUBIN UR: negative  (12/27/2009 11:11) Added new observation of NITRITE URN: negative  (12/27/2009 11:11) Added new observation of PROTEIN, URN: negative   (12/27/2009 11:11) Added new observation of BLOOD UR DIP: negative  (12/27/2009 11:11) Added new observation of KETONES URN: negative  (12/27/2009 11:11) Added new observation of GLUCOSE, URN: negative  (12/27/2009 11:11) Added new observation of APPEARANCE U: Hazy  (12/27/2009 11:11) Added new observation of UA COLOR: yellow  (12/27/2009 11:11)      Laboratory Results   Urine Tests  Date/Time Received: Dorie Rank RN  December 27, 2009 11:00 AM  Date/Time Reported: Dorie Rank RN  December 27, 2009 11:05 AM   Routine Urinalysis   Color: yellow Appearance: Hazy Glucose: negative   (Normal Range: Negative) Bilirubin: negative   (Normal Range: Negative) Ketone: negative   (Normal Range: Negative) Spec. Gravity: >=1.030   (Normal Range: 1.003-1.035) Blood: negative   (Normal Range: Negative) pH: 5.5   (Normal Range: 5.0-8.0) Protein: negative   (Normal Range: Negative) Urobilinogen: 0.2   (Normal Range: 0-1) Nitrite: negative   (Normal Range: Negative) Leukocyte Esterace: negative   (Normal Range: Negative)    Comments: pt c/o burning with urination on and off for 2-3 weeks associated with "strong odor" Dorie Rank RN  December 27, 2009 11:13 AM

## 2010-02-18 NOTE — Consult Note (Signed)
Summary: [facility]  [facility]   Imported By: Florinda Marker 11/14/2007 14:15:36  _____________________________________________________________________  External Attachment:    Type:   Image     Comment:   External Document

## 2010-02-18 NOTE — Miscellaneous (Signed)
Summary: HIPAA Restrictions  HIPAA Restrictions   Imported By: Florinda Marker 09/24/2007 14:21:04  _____________________________________________________________________  External Attachment:    Type:   Image     Comment:   External Document

## 2010-03-16 ENCOUNTER — Other Ambulatory Visit: Payer: Self-pay | Admitting: *Deleted

## 2010-03-17 MED ORDER — SIMVASTATIN 40 MG PO TABS: 40.0000 mg | ORAL_TABLET | Freq: Every day | ORAL | Status: DC

## 2010-03-17 MED ORDER — HYDROCHLOROTHIAZIDE 25 MG PO TABS: 25.0000 mg | ORAL_TABLET | Freq: Every day | ORAL | Status: DC

## 2010-03-17 MED ORDER — BENAZEPRIL HCL 20 MG PO TABS: 20.0000 mg | ORAL_TABLET | Freq: Every day | ORAL | Status: DC

## 2010-03-17 MED ORDER — METFORMIN HCL 500 MG PO TABS: 500.0000 mg | ORAL_TABLET | Freq: Every day | ORAL | Status: DC

## 2010-03-29 LAB — GLUCOSE, CAPILLARY: Glucose-Capillary: 115 mg/dL — ABNORMAL HIGH (ref 70–99)

## 2010-03-31 LAB — GLUCOSE, CAPILLARY: Glucose-Capillary: 111 mg/dL — ABNORMAL HIGH (ref 70–99)

## 2010-04-06 LAB — GLUCOSE, CAPILLARY: Glucose-Capillary: 89 mg/dL (ref 70–99)

## 2010-04-13 ENCOUNTER — Other Ambulatory Visit: Payer: Self-pay | Admitting: *Deleted

## 2010-04-13 ENCOUNTER — Other Ambulatory Visit: Payer: Self-pay | Admitting: Internal Medicine

## 2010-04-13 DIAGNOSIS — R0981 Nasal congestion: Secondary | ICD-10-CM

## 2010-04-13 MED ORDER — MOMETASONE FUROATE 50 MCG/ACT NA SUSP: 2.0000 | Freq: Every day | NASAL | Status: DC

## 2010-04-13 NOTE — Telephone Encounter (Signed)
Verbal order given per Dr. Gilford Rile x 5 refills - called to [facility] pharmacy.

## 2010-04-13 NOTE — Telephone Encounter (Signed)
Verbal order received from Dr. Dayle Points 5 refills - called to [facility] pharmacy.

## 2010-04-13 NOTE — Telephone Encounter (Signed)
Pt states she is completely out and can u refill it today.

## 2010-04-14 ENCOUNTER — Encounter: Payer: Self-pay | Admitting: Internal Medicine

## 2010-04-14 NOTE — Telephone Encounter (Signed)
Call to pharmacy-pt has received the medication.

## 2010-04-14 NOTE — Telephone Encounter (Signed)
Duplicate order; 5 refills were called in.

## 2010-04-15 ENCOUNTER — Encounter: Payer: Self-pay | Admitting: Internal Medicine

## 2010-04-22 LAB — GLUCOSE, CAPILLARY: Glucose-Capillary: 98 mg/dL (ref 70–99)

## 2010-04-27 LAB — URINALYSIS, ROUTINE W REFLEX MICROSCOPIC
Bilirubin Urine: NEGATIVE
Glucose, UA: NEGATIVE mg/dL
Hgb urine dipstick: NEGATIVE
Ketones, ur: NEGATIVE mg/dL
Nitrite: NEGATIVE
Protein, ur: NEGATIVE mg/dL
Specific Gravity, Urine: 1.013 (ref 1.005–1.030)
Urobilinogen, UA: 0.2 mg/dL (ref 0.0–1.0)
pH: 6.5 (ref 5.0–8.0)

## 2010-04-27 LAB — CBC
HCT: 42.2 % (ref 36.0–46.0)
Hemoglobin: 14 g/dL (ref 12.0–15.0)
MCHC: 33.2 g/dL (ref 30.0–36.0)
MCV: 87.9 fL (ref 78.0–100.0)
Platelets: 311 10*3/uL (ref 150–400)
RBC: 4.8 MIL/uL (ref 3.87–5.11)
RDW: 13.3 % (ref 11.5–15.5)
WBC: 7.3 10*3/uL (ref 4.0–10.5)

## 2010-04-27 LAB — COMPREHENSIVE METABOLIC PANEL
ALT: 17 U/L (ref 0–35)
AST: 20 U/L (ref 0–37)
Albumin: 3.9 g/dL (ref 3.5–5.2)
Alkaline Phosphatase: 96 U/L (ref 39–117)
BUN: 15 mg/dL (ref 6–23)
CO2: 31 mEq/L (ref 19–32)
Calcium: 10 mg/dL (ref 8.4–10.5)
Chloride: 102 mEq/L (ref 96–112)
Creatinine, Ser: 0.83 mg/dL (ref 0.4–1.2)
GFR calc Af Amer: >60 mL/min (ref >60)
GFR calc non Af Amer: >60 mL/min (ref >60)
Glucose, Bld: 109 mg/dL — ABNORMAL HIGH (ref 70–99)
Potassium: 4.4 mEq/L (ref 3.5–5.1)
Sodium: 138 mEq/L (ref 135–145)
Total Bilirubin: 0.4 mg/dL (ref 0.3–1.2)
Total Protein: 7.3 g/dL (ref 6.0–8.3)

## 2010-04-27 LAB — DIFFERENTIAL
Basophils Absolute: 0 10*3/uL (ref 0.0–0.1)
Basophils Relative: 1 % (ref 0–1)
Eosinophils Absolute: 0.4 10*3/uL (ref 0.0–0.7)
Eosinophils Relative: 5 % (ref 0–5)
Lymphocytes Relative: 16 % (ref 12–46)
Lymphs Abs: 1.1 10*3/uL (ref 0.7–4.0)
Monocytes Absolute: 0.7 10*3/uL (ref 0.1–1.0)
Monocytes Relative: 9 % (ref 3–12)
Neutro Abs: 5.1 10*3/uL (ref 1.7–7.7)
Neutrophils Relative %: 70 % (ref 43–77)

## 2010-04-27 LAB — GLUCOSE, CAPILLARY: Glucose-Capillary: 154 mg/dL — ABNORMAL HIGH (ref 70–99)

## 2010-04-29 ENCOUNTER — Encounter: Payer: Self-pay | Admitting: Internal Medicine

## 2010-04-29 ENCOUNTER — Ambulatory Visit (INDEPENDENT_AMBULATORY_CARE_PROVIDER_SITE_OTHER): Payer: Self-pay | Admitting: Internal Medicine

## 2010-04-29 VITALS — BP 117/69 | HR 73 | Temp 97.8°F | Ht 60.0 in | Wt 194.3 lb

## 2010-04-29 DIAGNOSIS — E119 Type 2 diabetes mellitus without complications: Secondary | ICD-10-CM

## 2010-04-29 DIAGNOSIS — N390 Urinary tract infection, site not specified: Secondary | ICD-10-CM

## 2010-04-29 DIAGNOSIS — R300 Dysuria: Secondary | ICD-10-CM

## 2010-04-29 DIAGNOSIS — E785 Hyperlipidemia, unspecified: Secondary | ICD-10-CM

## 2010-04-29 DIAGNOSIS — I10 Essential (primary) hypertension: Secondary | ICD-10-CM

## 2010-04-29 DIAGNOSIS — Z5181 Encounter for therapeutic drug level monitoring: Secondary | ICD-10-CM

## 2010-04-29 LAB — CBC WITH DIFFERENTIAL/PLATELET
Basophils Absolute: 0 10*3/uL (ref 0.0–0.1)
Basophils Relative: 1 % (ref 0–1)
Eosinophils Absolute: 0.2 10*3/uL (ref 0.0–0.7)
Eosinophils Relative: 3 % (ref 0–5)
HCT: 41.7 % (ref 36.0–46.0)
Hemoglobin: 13.3 g/dL (ref 12.0–15.0)
Lymphocytes Relative: 14 % (ref 12–46)
Lymphs Abs: 1 10*3/uL (ref 0.7–4.0)
MCH: 28.3 pg (ref 26.0–34.0)
MCHC: 31.9 g/dL (ref 30.0–36.0)
MCV: 88.7 fL (ref 78.0–100.0)
Monocytes Absolute: 0.5 10*3/uL (ref 0.1–1.0)
Monocytes Relative: 7 % (ref 3–12)
Neutro Abs: 5.2 10*3/uL (ref 1.7–7.7)
Neutrophils Relative %: 75 % (ref 43–77)
Platelets: 296 10*3/uL (ref 150–400)
RBC: 4.7 MIL/uL (ref 3.87–5.11)
RDW: 13.4 % (ref 11.5–15.5)
WBC: 7 10*3/uL (ref 4.0–10.5)

## 2010-04-29 LAB — URINALYSIS, ROUTINE W REFLEX MICROSCOPIC
Bilirubin Urine: NEGATIVE
Glucose, UA: NEGATIVE mg/dL
Hgb urine dipstick: NEGATIVE
Ketones, ur: NEGATIVE mg/dL
Leukocytes, UA: NEGATIVE
Nitrite: NEGATIVE
Protein, ur: NEGATIVE mg/dL
Specific Gravity, Urine: 1.024 (ref 1.005–1.030)
Urobilinogen, UA: 0.2 mg/dL (ref 0.0–1.0)
pH: 6 (ref 5.0–8.0)

## 2010-04-29 LAB — POCT URINALYSIS DIPSTICK
Bilirubin, UA: NEGATIVE
Blood, UA: NEGATIVE
Glucose, UA: NEGATIVE
Ketones, UA: NEGATIVE
Leukocytes, UA: NEGATIVE
Nitrite, UA: NEGATIVE
Protein, UA: NEGATIVE
Spec Grav, UA: >=1.03
Urobilinogen, UA: 0.2
pH, UA: 5.5

## 2010-04-29 LAB — COMPREHENSIVE METABOLIC PANEL
ALT: 11 U/L (ref 0–35)
AST: 18 U/L (ref 0–37)
Albumin: 4.3 g/dL (ref 3.5–5.2)
Alkaline Phosphatase: 73 U/L (ref 39–117)
BUN: 17 mg/dL (ref 6–23)
CO2: 28 mEq/L (ref 19–32)
Calcium: 9.9 mg/dL (ref 8.4–10.5)
Chloride: 100 mEq/L (ref 96–112)
Creat: 0.92 mg/dL (ref 0.40–1.20)
Glucose, Bld: 131 mg/dL — ABNORMAL HIGH (ref 70–99)
Potassium: 4 mEq/L (ref 3.5–5.3)
Sodium: 140 mEq/L (ref 135–145)
Total Bilirubin: 0.4 mg/dL (ref 0.3–1.2)
Total Protein: 7 g/dL (ref 6.0–8.3)

## 2010-04-29 LAB — POCT GLYCOSYLATED HEMOGLOBIN (HGB A1C): Hemoglobin A1C: 6.3

## 2010-04-29 LAB — GLUCOSE, CAPILLARY: Glucose-Capillary: 135 mg/dL — ABNORMAL HIGH (ref 70–99)

## 2010-04-29 MED ORDER — CIPROFLOXACIN HCL 500 MG PO TABS: 500.0000 mg | ORAL_TABLET | Freq: Two times a day (BID) | ORAL | Status: AC

## 2010-04-29 MED ORDER — ATORVASTATIN CALCIUM 40 MG PO TABS: 40.0000 mg | ORAL_TABLET | Freq: Every day | ORAL | Status: DC

## 2010-04-29 NOTE — Assessment & Plan Note (Signed)
Well controlled; no changes needed.   

## 2010-04-29 NOTE — Assessment & Plan Note (Addendum)
Excellently controlled, continue current treatment. We'll refer the patient for diabetic counseling with Jamison Neighbor.

## 2010-04-29 NOTE — Progress Notes (Signed)
  Subjective:    [name], female    DOB: 01-Mar-1950, 60 y.o.   MRN: [id number]  HPI  Patient is 60 year old female with past medical history diabetes hyperlipidemia hypertension presents to outpatient clinic for routine followup, patient is compliant with all of her medications, her hemoglobin A1c today 6.3, she asks lots of questions about her diabetic control and what food she should and should not eat I have discussed this in length with the patient. Patient today also complains of dysuria ongoing for the past week, has had similar symptoms back in December 2011, feels similar to that, she denies fever chills nausea vomiting  Review of Systems  [all other systems reviewed and are negative       Objective:   Physical Exam  [vitalsreviewed. Constitutional: She is oriented to person, place, and time. She appears well-developed and well-nourished.  HENT:  Head: Normocephalic and atraumatic.  Eyes: Pupils are equal, round, and reactive to light.  Neck: Normal range of motion. Neck supple. No JVD present. No thyromegaly present.  Cardiovascular: Normal rate, regular rhythm and normal heart sounds.   No murmur heard. Pulmonary/Chest: Effort normal and breath sounds normal. She has no wheezes. She has no rales.  Abdominal: Soft. Bowel sounds are normal.  Musculoskeletal: Normal range of motion. She exhibits no edema.  Neurological: She is alert and oriented to person, place, and time.  Skin: Skin is warm and dry.          Assessment & Plan:

## 2010-04-29 NOTE — Assessment & Plan Note (Signed)
Patient's cholesterol is well controlled her LDL was 109 simvastatin 40, will change to Lipitor 40 mg for target LDL of less than 70. Patient agrees with above plan

## 2010-04-30 LAB — VALPROIC ACID LEVEL: Valproic Acid Lvl: <1 ug/mL — ABNORMAL LOW (ref 50.0–100.0)

## 2010-05-01 LAB — URINE CULTURE
Colony Count: NO GROWTH
Organism ID, Bacteria: NO GROWTH

## 2010-05-02 LAB — GLUCOSE, CAPILLARY: Glucose-Capillary: 82 mg/dL (ref 70–99)

## 2010-05-16 ENCOUNTER — Ambulatory Visit: Payer: Self-pay | Admitting: Dietician

## 2010-05-16 ENCOUNTER — Encounter: Payer: Self-pay | Admitting: Dietician

## 2010-05-16 ENCOUNTER — Ambulatory Visit (INDEPENDENT_AMBULATORY_CARE_PROVIDER_SITE_OTHER): Payer: Self-pay | Admitting: Dietician

## 2010-05-16 DIAGNOSIS — E119 Type 2 diabetes mellitus without complications: Secondary | ICD-10-CM

## 2010-05-16 NOTE — Progress Notes (Signed)
Medical Nutrition Therapy:  Appt start time: 1430 end time:  1530.  Assessment:  Primary concerns today: Weight management and cholesterol Usual eating pattern includes Meal 2 and 1+ snacks per day. Per food recall suspect she is not getting adequate fruit and vegetables or omega 3 fatty acids. Enthusiastic about exercise and good nutrition, suspect positive outcomes. Avoided foods include: says she tries to limit sweets.    Usual physical activity includes walking 30-45 minutes a day 4 days per week    Progress Towards Goal(s):  In progress   Nutritional Diagnosis:  NB-1.1 Food and nutrition-related knowledge deficit As related to lack of prior exposure to portion control, healthy fats, good nutrtitional balance.  As evidenced by patient report and questions asked at her visit today.  Interventions: 1- Education about healthy and balanced eating. 2- Educaction about portions sizes 3- Education about healthier methods of food preparation 4- Education about importance of carb portions in relation to diabetes control.    Monitoring/Evaluation:  Dietary intake 3 weeks for reassessment and attainment of goals set per patient instructions

## 2010-05-16 NOTE — Patient Instructions (Signed)
Your goals for the next 3 weeks:   Eat at least 3 servings of fruit each day ( frozen banana in milkshake, small apple for snack, 1 cup   watermelon  Eat fish twice a week.  Walk 60 minutes a day if you can.  Make a follow up in 3 weeks

## 2010-05-18 ENCOUNTER — Other Ambulatory Visit: Payer: Self-pay | Admitting: Internal Medicine

## 2010-05-24 ENCOUNTER — Ambulatory Visit (INDEPENDENT_AMBULATORY_CARE_PROVIDER_SITE_OTHER): Payer: Medicare HMO | Admitting: Dietician

## 2010-05-24 DIAGNOSIS — E669 Obesity, unspecified: Secondary | ICD-10-CM

## 2010-05-24 NOTE — Progress Notes (Signed)
Patient attended a 1 hour group session today on making healthy lifestyle changes. The discussion included learning how to deal with behavior cues/triggers, easy exercises/preparing foods with less fat and general healthy nutrition.

## 2010-06-17 ENCOUNTER — Ambulatory Visit: Payer: Medicare HMO | Admitting: Dietician

## 2010-07-13 ENCOUNTER — Ambulatory Visit: Payer: Medicare HMO | Admitting: Dietician

## 2010-07-18 ENCOUNTER — Encounter: Payer: Medicare HMO | Admitting: Internal Medicine

## 2010-07-28 ENCOUNTER — Other Ambulatory Visit: Payer: Self-pay | Admitting: Internal Medicine

## 2010-08-11 ENCOUNTER — Other Ambulatory Visit: Payer: Self-pay | Admitting: Internal Medicine

## 2010-09-05 ENCOUNTER — Ambulatory Visit (INDEPENDENT_AMBULATORY_CARE_PROVIDER_SITE_OTHER): Payer: Medicare HMO | Admitting: Internal Medicine

## 2010-09-05 ENCOUNTER — Encounter: Payer: Self-pay | Admitting: Internal Medicine

## 2010-09-05 VITALS — BP 110/76 | HR 74 | Temp 98.1°F | Ht 61.0 in | Wt 182.8 lb

## 2010-09-05 DIAGNOSIS — I10 Essential (primary) hypertension: Secondary | ICD-10-CM

## 2010-09-05 DIAGNOSIS — Z23 Encounter for immunization: Secondary | ICD-10-CM

## 2010-09-05 DIAGNOSIS — E785 Hyperlipidemia, unspecified: Secondary | ICD-10-CM

## 2010-09-05 DIAGNOSIS — E119 Type 2 diabetes mellitus without complications: Secondary | ICD-10-CM

## 2010-09-05 LAB — POCT GLYCOSYLATED HEMOGLOBIN (HGB A1C): Hemoglobin A1C: 5.9

## 2010-09-05 LAB — GLUCOSE, CAPILLARY: Glucose-Capillary: 96 mg/dL (ref 70–99)

## 2010-09-05 NOTE — Assessment & Plan Note (Signed)
Excellent control, no changes needed. 

## 2010-09-05 NOTE — Assessment & Plan Note (Signed)
Well controlled on current treatment, No new changes made today, Will continue to monitor.   

## 2010-09-05 NOTE — Progress Notes (Signed)
  Subjective:    [name], female    DOB: 27-Oct-1950, 60 y.o.   MRN: [id number]  HPI  Patient is 60 year old female with past medical history diabetes, hyperlipidemia, and hypertension presents to outpatient clinic for routine followup, patient is compliant with all of her medications, her hemoglobin A1c today 5.9, and she is only taking metformin 500 mg once daily, reports that she has been compliant with her diet medications and she has been exercising every day by walking. She reports losing 10 pounds intentionally. Denies any chest pain, shortness of breath, nausea, vomiting, or diarrhea.         Review of Systems  All other systems reviewed and are negative.       Objective:   Physical Exam  Nursing note and vitals reviewed. Constitutional: She is oriented to person, place, and time. She appears well-developed and well-nourished.  HENT:  Head: Normocephalic and atraumatic.  Eyes: Pupils are equal, round, and reactive to light.  Neck: Normal range of motion. Neck supple. No JVD present. No thyromegaly present.  Cardiovascular: Normal rate, regular rhythm and normal heart sounds.   No murmur heard. Pulmonary/Chest: Effort normal and breath sounds normal. She has no wheezes. She has no rales.  Abdominal: Soft. Bowel sounds are normal.  Musculoskeletal: Normal range of motion. She exhibits no edema.  Neurological: She is alert and oriented to person, place, and time.  Skin: Skin is warm and dry.          Assessment & Plan:

## 2010-09-05 NOTE — Assessment & Plan Note (Signed)
Given her the patient has been compliant with diet, has been exercising, and has lost 10 pounds intentionally. Today her A1c is 5.9 which is very well controlled, I will try to discontinue metformin and see how the patient's A1c responds in 3 months. I informed the patient to check her CBC, and contact the clinic if they're elevated

## 2010-09-09 ENCOUNTER — Other Ambulatory Visit: Payer: Self-pay | Admitting: Internal Medicine

## 2010-10-19 LAB — GLUCOSE, CAPILLARY: Glucose-Capillary: 98

## 2010-12-29 ENCOUNTER — Other Ambulatory Visit: Payer: Self-pay | Admitting: Internal Medicine

## 2011-01-05 ENCOUNTER — Ambulatory Visit (INDEPENDENT_AMBULATORY_CARE_PROVIDER_SITE_OTHER): Payer: Medicare Other | Admitting: Internal Medicine

## 2011-01-05 ENCOUNTER — Encounter: Payer: Self-pay | Admitting: Internal Medicine

## 2011-01-05 VITALS — BP 116/70 | HR 73 | Temp 97.6°F | Ht 60.0 in | Wt 188.6 lb

## 2011-01-05 DIAGNOSIS — E119 Type 2 diabetes mellitus without complications: Secondary | ICD-10-CM

## 2011-01-05 DIAGNOSIS — R7309 Other abnormal glucose: Secondary | ICD-10-CM

## 2011-01-05 DIAGNOSIS — I10 Essential (primary) hypertension: Secondary | ICD-10-CM

## 2011-01-05 DIAGNOSIS — Z23 Encounter for immunization: Secondary | ICD-10-CM

## 2011-01-05 DIAGNOSIS — R7303 Prediabetes: Secondary | ICD-10-CM

## 2011-01-05 DIAGNOSIS — E785 Hyperlipidemia, unspecified: Secondary | ICD-10-CM

## 2011-01-05 LAB — POCT GLYCOSYLATED HEMOGLOBIN (HGB A1C): Hemoglobin A1C: 6.2

## 2011-01-05 LAB — GLUCOSE, CAPILLARY: Glucose-Capillary: 97 mg/dL (ref 70–99)

## 2011-01-05 NOTE — Assessment & Plan Note (Signed)
Well controlled on current treatment, No new changes made today, Will continue to monitor.   

## 2011-01-05 NOTE — Progress Notes (Signed)
Addended by: Maura Crandall on: 01/05/2011 03:56 PM   Modules accepted: Orders

## 2011-01-05 NOTE — Progress Notes (Signed)
Subjective:     [name], female   DOB: 1950/08/26, 60 y.o.   MRN: [id number]  HPI  Patient is 60 year old female with past medical history diabetes hyperlipidemia hypertension presents to outpatient clinic for routine followup, patient is compliant with all of her medications, she was labeled as a diabetic with A1c of 6.5,however with diet control alone her hemoglobin A1c dropped to 5.9 last visit, today its 6.2, therefore she is no longer a diabetic and can be considered prediabetic. Patient has no other complaints today.  Review of Systems  All other systems reviewed and are negative.       Objective:   Physical Exam  Nursing note and vitals reviewed. Constitutional: She is oriented to person, place, and time. She appears well-developed and well-nourished.  HENT:  Head: Normocephalic and atraumatic.  Eyes: Pupils are equal, round, and reactive to light.  Neck: Normal range of motion. Neck supple. No JVD present. No thyromegaly present.  Cardiovascular: Normal rate, regular rhythm and normal heart sounds.   No murmur heard. Pulmonary/Chest: Effort normal and breath sounds normal. She has no wheezes. She has no rales.  Abdominal: Soft. Bowel sounds are normal.  Musculoskeletal: Normal range of motion. She exhibits no edema.  Neurological: She is alert and oriented to person, place, and time.  Skin: Skin is warm and dry.

## 2011-01-05 NOTE — Assessment & Plan Note (Signed)
Last LDL 109, since then patient has been on Lipitor 40 mg, will check fasting lipid today along with a complete metabolic panel. We'll make adjustments as needed, her target LDL is less than 100

## 2011-01-05 NOTE — Assessment & Plan Note (Signed)
Patient had diabetes which was medically managed with oral antihyperglycemic, however due to diet changes patient's A1c dropped significantly and she was able to get off her anti-hyperglycemics, now is only on diet control, A1c today 6.2. I have informed the patient's to continue diet management of her borderline diabetes, we'll recheck A1c in 3 months, we'll not restart any anti-hyperglycemics unless her A1c is more than 7

## 2011-01-05 NOTE — Patient Instructions (Signed)
Diets for Diabetes, Food Labeling Look at food labels to help you decide how much of a product you can eat. You will want to check the amount of total carbohydrate in a serving to see how the food fits into your meal plan. In the list of ingredients, the ingredient present in the largest amount by weight must be listed first, followed by the other ingredients in descending order. STANDARD OF IDENTITY Most products have a list of ingredients. However, foods that the Food and Drug Administration (FDA) has given a standard of identity do not need a list of ingredients. A standard of identity means that a food must contain certain ingredients if it is called a particular name. Examples are mayonnaise, peanut butter, ketchup, jelly, and cheese. LABELING TERMS There are many terms found on food labels. Some of these terms have specific definitions. Some terms are regulated by the FDA, and the FDA has clearly specified how they can be used. Others are not regulated or well-defined and can be misleading and confusing. SPECIFICALLY DEFINED TERMS Nutritive Sweetener.  A sweetener that contains calories,such as table sugar or honey.  Nonnutritive Sweetener.  A sweetener with few or no calories,such as saccharin, aspartame, sucralose, and cyclamate.  LABELING TERMS REGULATED BY THE FDA Free.  The product contains only a tiny or small amount of fat, cholesterol, sodium, sugar, or calories. For example, a "fat-free" product will contain less than 0.5 g of fat per serving.  Low.  A food described as "low" in fat, saturated fat, cholesterol, sodium, or calories could be eaten fairly often without exceeding dietary guidelines. For example, "low in fat" means no more than 3 g of fat per serving.  Lean.  "Lean" and "extra lean" are U.S. Department of Agriculture Scientist, research (physical sciences)) terms for use on meat and poultry products. "Lean" means the product contains less than 10 g of fat, 4 g of saturated fat, and 95 mg of  cholesterol per serving. "Lean" is not as low in fat as a product labeled "low."  Extra Lean.  "Extra lean" means the product contains less than 5 g of fat, 2 g of saturated fat, and 95 mg of cholesterol per serving. While "extra lean" has less fat than "lean," it is still higher in fat than a product labeled "low."  Reduced, Less, Fewer.  A diet product that contains 25% less of a nutrient or calories than the regular version. For example, hot dogs might be labeled "25% less fat than our regular hot dogs."  Light/Lite.  A diet product that contains ? fewer calories or  the fat of the original. For example, "light in sodium" means a product with  the usual sodium.  More.  One serving contains at least 10% more of the daily value of a vitamin, mineral, or fiber than usual.  Good Source Of.  One serving contains 10% to 19% of the daily value for a particular vitamin, mineral, or fiber.  Excellent Source Of.  One serving contains 20% or more of the daily value for a particular nutrient. Other terms used might be "high in" or "rich in."  Enriched or Fortified.  The product contains added vitamins, minerals, or protein. Nutrition labeling must be used on enriched or fortified foods.  Imitation.  The product has been altered so that it is lower in protein, vitamins, or minerals than the usual food,such as imitation peanut butter.  Total Fat.  The number listed is the total of all fat found in a serving  of the product. Under total fat, food labels must list saturated fat and trans fat, which are associated with raising bad cholesterol and an increased risk of heart blood vessel disease.  Saturated Fat.  Mainly fats from animal-based sources. Some examples are red meat, cheese, cream, whole milk, and coconut oil.  Trans Fat.  Found in some fried snack foods, packaged foods, and fried restaurant foods. It is recommended you eat as close to 0 g of trans fat as possible, since it raises bad  cholesterol and lowers good cholesterol.  Polyunsaturated and Monounsaturated Fats.  More healthful fats. These fats are from plant sources.  Total Carbohydrate.  The number of carbohydrate grams in a serving of the product. Under total carbohydrate are listed the other carbohydrate sources, such as dietary fiber and sugars.  Dietary Fiber.  A carbohydrate from plant sources.  Sugars.  Sugars listed on the label contain all naturally occurring sugars as well as added sugars.  LABELING TERMS NOT REGULATED BY THE FDA Sugarless.  Table sugar (sucrose) has not been added. However, the manufacturer may use another form of sugar in place of sucrose to sweeten the product. For example, sugar alcohols are used to sweeten foods. Sugar alcohols are a form of sugar but are not table sugar. If a product contains sugar alcohols in place of sucrose, it can still be labeled "sugarless."  Low Salt, Salt-Free, Unsalted, No Salt, No Salt Added, Without Added Salt.  Food that is usually processed with salt has been made without salt. However, the food may contain sodium-containing additives, such as preservatives, leavening agents, or flavorings.  Natural.  This term has no legal meaning.  Organic.  Foods that are certified as organic have been inspected and approved by the USDA to ensure they are produced without pesticides, fertilizers containing synthetic ingredients, bioengineering, or ionizing radiation.  Document Released: 01/05/2003 Document Revised: 09/14/2010 Document Reviewed: 07/23/2008 [facility] Patient Information 2012 [place], [place].

## 2011-01-06 ENCOUNTER — Encounter: Payer: Medicare HMO | Admitting: Internal Medicine

## 2011-01-06 ENCOUNTER — Other Ambulatory Visit: Payer: Self-pay | Admitting: *Deleted

## 2011-01-06 LAB — COMPREHENSIVE METABOLIC PANEL
ALT: 15 U/L (ref 0–35)
AST: 17 U/L (ref 0–37)
Albumin: 4.4 g/dL (ref 3.5–5.2)
Alkaline Phosphatase: 107 U/L (ref 39–117)
BUN: 18 mg/dL (ref 6–23)
CO2: 26 mEq/L (ref 19–32)
Calcium: 10 mg/dL (ref 8.4–10.5)
Chloride: 101 mEq/L (ref 96–112)
Creat: 0.92 mg/dL (ref 0.50–1.10)
Glucose, Bld: 92 mg/dL (ref 70–99)
Potassium: 4.7 mEq/L (ref 3.5–5.3)
Sodium: 140 mEq/L (ref 135–145)
Total Bilirubin: 0.6 mg/dL (ref 0.3–1.2)
Total Protein: 7.2 g/dL (ref 6.0–8.3)

## 2011-01-06 LAB — LIPID PANEL
Cholesterol: 143 mg/dL (ref 0–200)
HDL: 48 mg/dL (ref >39)
LDL Cholesterol: 87 mg/dL (ref 0–99)
Total CHOL/HDL Ratio: 3 Ratio
Triglycerides: 38 mg/dL (ref <150)
VLDL: 8 mg/dL (ref 0–40)

## 2011-01-06 MED ORDER — BENAZEPRIL HCL 20 MG PO TABS: 20.0000 mg | ORAL_TABLET | Freq: Every day | ORAL | Status: DC

## 2011-01-06 MED ORDER — HYDROCHLOROTHIAZIDE 25 MG PO TABS: 25.0000 mg | ORAL_TABLET | Freq: Every day | ORAL | Status: DC

## 2011-01-06 NOTE — Telephone Encounter (Signed)
Pt is also requesting Metformin 500 mg tablets # 30 1 po daily.  Angelina Ok, RN 01/06/2011 2:27 PM

## 2011-01-06 NOTE — Telephone Encounter (Signed)
Patient's A1c 6.2 without any medication, she does not require metformin

## 2011-01-24 ENCOUNTER — Telehealth: Payer: Self-pay | Admitting: *Deleted

## 2011-01-24 NOTE — Telephone Encounter (Signed)
Pt wants omeprazole increased to twice daily, if this is acceptable please send new script, omeprazole 20mg  1 twice daily #180, she states you and she talked about this at a visit

## 2011-01-26 MED ORDER — OMEPRAZOLE 20 MG PO CPDR: 40.0000 mg | DELAYED_RELEASE_CAPSULE | Freq: Every day | ORAL | Status: DC

## 2011-01-26 NOTE — Telephone Encounter (Signed)
Done, I have increased patient's omeprazole to 40 mg daily

## 2011-01-31 ENCOUNTER — Other Ambulatory Visit: Payer: Self-pay | Admitting: Internal Medicine

## 2011-03-27 ENCOUNTER — Other Ambulatory Visit: Payer: Self-pay | Admitting: Internal Medicine

## 2011-04-22 ENCOUNTER — Encounter (HOSPITAL_COMMUNITY): Payer: Self-pay | Admitting: *Deleted

## 2011-04-22 ENCOUNTER — Emergency Department (HOSPITAL_COMMUNITY): Payer: Medicare Other

## 2011-04-22 ENCOUNTER — Emergency Department (HOSPITAL_COMMUNITY)
Admission: EM | Admit: 2011-04-22 | Discharge: 2011-04-22 | Disposition: A | Discharge status: Home / Self Care | Payer: Medicare Other | Attending: Emergency Medicine | Admitting: Emergency Medicine

## 2011-04-22 DIAGNOSIS — I10 Essential (primary) hypertension: Secondary | ICD-10-CM | POA: Insufficient documentation

## 2011-04-22 DIAGNOSIS — J449 Chronic obstructive pulmonary disease, unspecified: Secondary | ICD-10-CM | POA: Insufficient documentation

## 2011-04-22 DIAGNOSIS — J4489 Other specified chronic obstructive pulmonary disease: Secondary | ICD-10-CM | POA: Insufficient documentation

## 2011-04-22 DIAGNOSIS — Z79899 Other long term (current) drug therapy: Secondary | ICD-10-CM | POA: Insufficient documentation

## 2011-04-22 DIAGNOSIS — E119 Type 2 diabetes mellitus without complications: Secondary | ICD-10-CM | POA: Insufficient documentation

## 2011-04-22 DIAGNOSIS — E785 Hyperlipidemia, unspecified: Secondary | ICD-10-CM | POA: Insufficient documentation

## 2011-04-22 DIAGNOSIS — R300 Dysuria: Secondary | ICD-10-CM

## 2011-04-22 DIAGNOSIS — J069 Acute upper respiratory infection, unspecified: Secondary | ICD-10-CM

## 2011-04-22 LAB — URINALYSIS, ROUTINE W REFLEX MICROSCOPIC
Bilirubin Urine: NEGATIVE
Glucose, UA: NEGATIVE mg/dL
Hgb urine dipstick: NEGATIVE
Ketones, ur: NEGATIVE mg/dL
Leukocytes, UA: NEGATIVE
Nitrite: NEGATIVE
Protein, ur: NEGATIVE mg/dL
Specific Gravity, Urine: 1.015 (ref 1.005–1.030)
Urobilinogen, UA: 0.2 mg/dL (ref 0.0–1.0)
pH: 6 (ref 5.0–8.0)

## 2011-04-22 MED ORDER — HYDROCOD POLST-CHLORPHEN POLST 10-8 MG/5ML PO LQCR: 5.0000 mL | Freq: Two times a day (BID) | ORAL | Status: DC | PRN

## 2011-04-22 MED ORDER — ALBUTEROL SULFATE HFA 108 (90 BASE) MCG/ACT IN AERS
2.0000 | INHALATION_SPRAY | RESPIRATORY_TRACT | Status: DC | PRN
  Administered 2011-04-22 (×2): 2 via RESPIRATORY_TRACT
  Filled 2011-04-22: qty 6.7

## 2011-04-22 NOTE — ED Notes (Addendum)
Pt in c/o shortness of breath and cough, states it is hard for her to take a deep breath, cough x2 days, pt speaking in full sentences, no distress at this time, Pt also c/o burning with urination x2 weeks

## 2011-04-22 NOTE — Discharge Instructions (Signed)
Upper Respiratory Infection, Adult An upper respiratory infection (URI) is also sometimes known as the common cold. The upper respiratory tract includes the nose, sinuses, throat, trachea, and bronchi. Bronchi are the airways leading to the lungs. Most people improve within 1 week, but symptoms can last up to 2 weeks. A residual cough may last even longer.  CAUSES Many different viruses can infect the tissues lining the upper respiratory tract. The tissues become irritated and inflamed and often become very moist. Mucus production is also common. A cold is contagious. You can easily spread the virus to others by oral contact. This includes kissing, sharing a glass, coughing, or sneezing. Touching your mouth or nose and then touching a surface, which is then touched by another person, can also spread the virus. SYMPTOMS  Symptoms typically develop 1 to 3 days after you come in contact with a cold virus. Symptoms vary from person to person. They may include:  Runny nose.   Sneezing.   Nasal congestion.   Sinus irritation.   Sore throat.   Loss of voice (laryngitis).   Cough.   Fatigue.   Muscle aches.   Loss of appetite.   Headache.   Low-grade fever.  DIAGNOSIS  You might diagnose your own cold based on familiar symptoms, since most people get a cold 2 to 3 times a year. Your caregiver can confirm this based on your exam. Most importantly, your caregiver can check that your symptoms are not due to another disease such as strep throat, sinusitis, pneumonia, asthma, or epiglottitis. Blood tests, throat tests, and X-rays are not necessary to diagnose a common cold, but they may sometimes be helpful in excluding other more serious diseases. Your caregiver will decide if any further tests are required. RISKS AND COMPLICATIONS  You may be at risk for a more severe case of the common cold if you smoke cigarettes, have chronic heart disease (such as heart failure) or lung disease (such as  asthma), or if you have a weakened immune system. The very young and very old are also at risk for more serious infections. Bacterial sinusitis, middle ear infections, and bacterial pneumonia can complicate the common cold. The common cold can worsen asthma and chronic obstructive pulmonary disease (COPD). Sometimes, these complications can require emergency medical care and may be life-threatening. PREVENTION  The best way to protect against getting a cold is to practice good hygiene. Avoid oral or hand contact with people with cold symptoms. Wash your hands often if contact occurs. There is no clear evidence that vitamin C, vitamin E, echinacea, or exercise reduces the chance of developing a cold. However, it is always recommended to get plenty of rest and practice good nutrition. TREATMENT  Treatment is directed at relieving symptoms. There is no cure. Antibiotics are not effective, because the infection is caused by a virus, not by bacteria. Treatment may include:  Increased fluid intake. Sports drinks offer valuable electrolytes, sugars, and fluids.   Breathing heated mist or steam (vaporizer or shower).   Eating chicken soup or other clear broths, and maintaining good nutrition.   Getting plenty of rest.   Using gargles or lozenges for comfort.   Controlling fevers with ibuprofen or acetaminophen as directed by your caregiver.   Increasing usage of your inhaler if you have asthma.  Zinc gel and zinc lozenges, taken in the first 24 hours of the common cold, can shorten the duration and lessen the severity of symptoms. Pain medicines may help with fever, muscle   aches, and throat pain. A variety of non-prescription medicines are available to treat congestion and runny nose. Your caregiver can make recommendations and may suggest nasal or lung inhalers for other symptoms.  HOME CARE INSTRUCTIONS   Only take over-the-counter or prescription medicines for pain, discomfort, or fever as directed  by your caregiver.   Use a warm mist humidifier or inhale steam from a shower to increase air moisture. This may keep secretions moist and make it easier to breathe.   Drink enough water and fluids to keep your urine clear or pale yellow.   Rest as needed.   Return to work when your temperature has returned to normal or as your caregiver advises. You may need to stay home longer to avoid infecting others. You can also use a face mask and careful hand washing to prevent spread of the virus.  SEEK MEDICAL CARE IF:   After the first few days, you feel you are getting worse rather than better.   You need your caregiver's advice about medicines to control symptoms.   You develop chills, worsening shortness of breath, or brown or red sputum. These may be signs of pneumonia.   You develop yellow or brown nasal discharge or pain in the face, especially when you bend forward. These may be signs of sinusitis.   You develop a fever, swollen neck glands, pain with swallowing, or white areas in the back of your throat. These may be signs of strep throat.  SEEK IMMEDIATE MEDICAL CARE IF:   You have a fever.   You develop severe or persistent headache, ear pain, sinus pain, or chest pain.   You develop wheezing, a prolonged cough, cough up blood, or have a change in your usual mucus (if you have chronic lung disease).   You develop sore muscles or a stiff neck.  Document Released: 06/28/2000 Document Revised: 12/22/2010 Document Reviewed: 05/06/2010 [facility] Patient Information 2012 [place], [place].  Dysuria You have dysuria. This is pain on urination. Dysuria is often present with other symptoms such as:  A sudden urge to go.   Having to go more often.  Dysuria can be caused by:  Urinary tract infections.   Yeast infections.   Prostate problems.   Urinary stones.   Sexually transmitted diseases.  Lab tests of the urine will usually be needed to confirm a urinary infection. An  infection is the cause of dysuria in over half the cases. In older men the prostate gland enlarges and can cause urinary problems. These include:   Urinary obstruction.   Infection.   Pain on urination.  Bladder cancer can also cause blood in the urine and dysuria. If you have an infection, be sure to take the antibiotics prescribed for you until they are gone. This will help prevent a recurrence. Further checking by a specialist may be needed if the cause of your dysuria is not found. Cystoscopy, x-rays, pelvic exams, and special cultures may be needed to find the cause and help find the best treatment. See your caregiver right away if your symptoms are not improved after three days.  SEEK IMMEDIATE MEDICAL CARE IF:  You have difficulty urinating, pass bloody urine, or have chills or a fever. Document Released: 01/02/2005 Document Revised: 12/22/2010 Document Reviewed: 06/19/2006 [facility] Patient Information 2012 [place], [place].

## 2011-04-22 NOTE — ED Provider Notes (Signed)
History     CSN: [id number]  Arrival date & time 04/22/11  1222   First MD Initiated Contact with Patient 04/22/11 1304      Chief Complaint  Patient presents with  . Shortness of Breath  . Cough    (Consider location/radiation/quality/duration/timing/severity/associated sxs/prior treatment) HPI  This 61 year old female with no history of asthma or history of COPD presents with chief complaints of chest congestion. Patient's for the past week she has been having runny nose, nasal congestion, itchy ears, throat irritation and chest congestion. Patient states she has been having a nonproductive cough. States she's tried a produce a productive cough but unable to. This symptom has been persistent, not improving or worsening with anything. She denies any recent medication change or taking any ACE inhibitor. She denies fever, chest pain, back pain, nausea, vomiting, diarrhea, abdominal pain. She is a former smoker who has quit since 1998  Patient also complaining of intermittent burning urination, with urinary frequency for the past month. Symptom is intermittent. Nothing makes it worse, nothing makes it better. She denies any significant history of hearing tract infection. She denies any abdominal pain, rash, or vaginal discharge. She denies douching. She has a total hysterectomy.  Past Medical History  Diagnosis Date  . Diabetes mellitus   . Hypertension   . Hyperlipidemia   . Low back pain   . Cervical spondylosis   . Obesity   . Depression     followed by Dr. Allyne Gee; no longer of depakote, seroquel  . Chronic serous otitis media   . Foot drop   . Constipation   . Paraumbilical hernia     Past Surgical History  Procedure Date  . Cholecystectomy   . Hernia repair 1999    History reviewed. No pertinent family history.  History  Substance Use Topics  . Smoking status: Former Smoker    Types: Cigarettes    Quit date: 01/17/1996  . Smokeless tobacco: Not on file  .  Alcohol Use: No    OB History    Grav Para Term Preterm Abortions TAB SAB Ect Mult Living                  Review of Systems  All other systems reviewed and are negative.    Allergies  Review of patient's allergies indicates no known allergies.  Home Medications   Current Outpatient Rx  Name Route Sig Dispense Refill  . ACETAMINOPHEN 500 MG PO TABS Oral Take 500 mg by mouth. Up to 6 times daily for pain     . ASPIRIN 81 MG PO TBEC Oral Take 81 mg by mouth daily.      . ATORVASTATIN CALCIUM 40 MG PO TABS Oral Take 1 tablet (40 mg total) by mouth daily. 30 tablet 11  . BENAZEPRIL HCL 20 MG PO TABS Oral Take 1 tablet (20 mg total) by mouth daily. 30 tablet 6  . PRODIGY BLOOD GLUCOSE MONITOR DEVI  Use as instructed     . CALCIUM CARBONATE 600 MG PO TABS Oral Take 600 mg by mouth 2 (two) times daily.      Marland Kitchen CETIRIZINE HCL 10 MG PO TABS Oral Take 10 mg by mouth daily.      Marland Kitchen VITAMIN D3 1000 UNITS PO CAPS Oral Take 2 tablets by mouth daily.      Marland Kitchen GABAPENTIN 300 MG PO CAPS Oral Take 300 mg by mouth at bedtime.      Marland Kitchen GLUCOSE BLOOD VI STRP  Use  as instructed     . HYDROCHLOROTHIAZIDE 25 MG PO TABS Oral Take 1 tablet (25 mg total) by mouth daily. 30 tablet 6  . MOMETASONE FUROATE 50 MCG/ACT NA SUSP Nasal 2 sprays by Nasal route daily. 17 g 3  . OMEPRAZOLE 20 MG PO CPDR  take 2 capsules by mouth once daily 30 capsule 3  . PAROXETINE HCL 20 MG PO TABS Oral Take 20 mg by mouth every morning.      Marland Kitchen TRAMADOL HCL 50 MG PO TABS  take 1 tablet by mouth three times a day if needed for pain 60 tablet 1    BP 129/69  Pulse 94  Temp(Src) 98.7 F (37.1 C) (Oral)  Resp 20  SpO2 100%  LMP 04/29/1966  Physical Exam  Nursing note and vitals reviewed. Constitutional: She appears well-developed and well-nourished. No distress.       Awake, alert, nontoxic appearance  HENT:  Head: Atraumatic.  Right Ear: Hearing, tympanic membrane and external ear normal.  Left Ear: Hearing, tympanic  membrane and external ear normal.  Nose: Rhinorrhea present.  Mouth/Throat: Uvula is midline, oropharynx is clear and moist and mucous membranes are normal.  Eyes: Conjunctivae are normal. Right eye exhibits no discharge. Left eye exhibits no discharge.  Neck: Neck supple.  Cardiovascular: Normal rate and regular rhythm.   Pulmonary/Chest: Effort normal. No respiratory distress. She has no wheezes. She has no rales. She exhibits no tenderness.  Abdominal: Soft. There is no tenderness. There is no rebound and no CVA tenderness.  Musculoskeletal: She exhibits no tenderness.       ROM appears intact, no obvious focal weakness  Neurological: She is alert.       Mental status and motor strength appears intact  Skin: Skin is warm. No rash noted.  Psychiatric: She has a normal mood and affect.    ED Course  Procedures (including critical care time)   Labs Reviewed  URINALYSIS, ROUTINE W REFLEX MICROSCOPIC   No results found.   No diagnosis found.  Results for orders placed during the hospital encounter of 04/22/11  URINALYSIS, ROUTINE W REFLEX MICROSCOPIC      Component Value Range   Color, Urine YELLOW  YELLOW    APPearance CLOUDY (*) CLEAR    Specific Gravity, Urine 1.015  1.005 - 1.030    pH 6.0  5.0 - 8.0    Glucose, UA NEGATIVE  NEGATIVE (mg/dL)   Hgb urine dipstick NEGATIVE  NEGATIVE    Bilirubin Urine NEGATIVE  NEGATIVE    Ketones, ur NEGATIVE  NEGATIVE (mg/dL)   Protein, ur NEGATIVE  NEGATIVE (mg/dL)   Urobilinogen, UA 0.2  0.0 - 1.0 (mg/dL)   Nitrite NEGATIVE  NEGATIVE    Leukocytes, UA NEGATIVE  NEGATIVE    Dg Chest 2 View  04/22/2011  *RADIOLOGY REPORT*  Clinical Data: Shortness of breath and cough  CHEST - 2 VIEW  Comparison: 05/10/2008  Findings: Heart size appears normal.  No pleural effusion or pulmonary edema.  Similar appearance of the bibasilar scarring.  No superimposed airspace consolidation.  Review of the visualized osseous structures is significant for mild  multilevel spondylosis.  IMPRESSION:  1.  No acute findings. 2.  Stable scarring in both lung bases.  Original Report Authenticated By: Rosealee Albee, M.D.      MDM  Pt with URI sxs.  Is afebrile with stable normal vital sign, normal sats.  CXR shows no acute changes.  Will prescribe cough medication.  Low suspicion  for cardio related etiology.  No obvious resp distress.  Able to speak in complete sentences.  Will give albuterol inhaler for symptomatic control.  Has been complaining of dysuria.  Her UA is negative for infection.  I recommend for pt to f/u with her PCP at [facility] for further evaluation.          Fayrene Helper, PA-C 04/22/11 1323  Fayrene Helper, PA-C 04/22/11 1327

## 2011-04-23 NOTE — ED Provider Notes (Signed)
Medical screening examination/treatment/procedure(s) were performed by non-physician practitioner and as supervising physician I was immediately available for consultation/collaboration.   Japji Kok Y. Anayeli Arel, MD 04/23/11 0726 

## 2011-04-25 ENCOUNTER — Other Ambulatory Visit: Payer: Self-pay | Admitting: Internal Medicine

## 2011-04-25 ENCOUNTER — Ambulatory Visit (INDEPENDENT_AMBULATORY_CARE_PROVIDER_SITE_OTHER): Payer: Medicare Other | Admitting: Internal Medicine

## 2011-04-25 ENCOUNTER — Encounter: Payer: Self-pay | Admitting: Internal Medicine

## 2011-04-25 VITALS — BP 141/89 | HR 81 | Temp 97.4°F | Ht 61.0 in | Wt 199.6 lb

## 2011-04-25 DIAGNOSIS — F329 Major depressive disorder, single episode, unspecified: Secondary | ICD-10-CM

## 2011-04-25 DIAGNOSIS — Z79899 Other long term (current) drug therapy: Secondary | ICD-10-CM

## 2011-04-25 DIAGNOSIS — J329 Chronic sinusitis, unspecified: Secondary | ICD-10-CM

## 2011-04-25 DIAGNOSIS — F3289 Other specified depressive episodes: Secondary | ICD-10-CM

## 2011-04-25 DIAGNOSIS — R7303 Prediabetes: Secondary | ICD-10-CM | POA: Insufficient documentation

## 2011-04-25 DIAGNOSIS — E119 Type 2 diabetes mellitus without complications: Secondary | ICD-10-CM

## 2011-04-25 DIAGNOSIS — G4700 Insomnia, unspecified: Secondary | ICD-10-CM

## 2011-04-25 DIAGNOSIS — F319 Bipolar disorder, unspecified: Secondary | ICD-10-CM

## 2011-04-25 LAB — CBC WITH DIFFERENTIAL/PLATELET
Basophils Absolute: 0 10*3/uL (ref 0.0–0.1)
Basophils Relative: 0 % (ref 0–1)
Eosinophils Absolute: 0.4 10*3/uL (ref 0.0–0.7)
Eosinophils Relative: 4 % (ref 0–5)
HCT: 40.6 % (ref 36.0–46.0)
Hemoglobin: 12.8 g/dL (ref 12.0–15.0)
Lymphocytes Relative: 12 % (ref 12–46)
Lymphs Abs: 1.1 10*3/uL (ref 0.7–4.0)
MCH: 28.3 pg (ref 26.0–34.0)
MCHC: 31.5 g/dL (ref 30.0–36.0)
MCV: 89.8 fL (ref 78.0–100.0)
Monocytes Absolute: 0.6 10*3/uL (ref 0.1–1.0)
Monocytes Relative: 7 % (ref 3–12)
Neutro Abs: 6.9 10*3/uL (ref 1.7–7.7)
Neutrophils Relative %: 77 % (ref 43–77)
Platelets: 299 10*3/uL (ref 150–400)
RBC: 4.52 MIL/uL (ref 3.87–5.11)
RDW: 13.3 % (ref 11.5–15.5)
WBC: 9 10*3/uL (ref 4.0–10.5)

## 2011-04-25 LAB — LIPID PANEL
Cholesterol: 134 mg/dL (ref 0–200)
HDL: 52 mg/dL (ref >39)
LDL Cholesterol: 67 mg/dL (ref 0–99)
Total CHOL/HDL Ratio: 2.6 Ratio
Triglycerides: 74 mg/dL (ref <150)
VLDL: 15 mg/dL (ref 0–40)

## 2011-04-25 LAB — POCT GLYCOSYLATED HEMOGLOBIN (HGB A1C): Hemoglobin A1C: 6.8

## 2011-04-25 LAB — GLUCOSE, CAPILLARY: Glucose-Capillary: 127 mg/dL — ABNORMAL HIGH (ref 70–99)

## 2011-04-25 MED ORDER — DOXYCYCLINE HYCLATE 100 MG PO TABS: 100.0000 mg | ORAL_TABLET | Freq: Two times a day (BID) | ORAL | Status: AC

## 2011-04-25 MED ORDER — ZOLPIDEM TARTRATE 10 MG PO TABS: 10.0000 mg | ORAL_TABLET | Freq: Every evening | ORAL | Status: DC | PRN

## 2011-04-25 MED ORDER — SERTRALINE HCL 100 MG PO TABS: ORAL_TABLET | ORAL | Status: DC

## 2011-04-25 MED ORDER — FLUTICASONE PROPIONATE 50 MCG/ACT NA SUSP: 1.0000 | Freq: Every day | NASAL | Status: DC

## 2011-04-25 MED ORDER — DIVALPROEX SODIUM ER 250 MG PO TB24: 1000.0000 mg | ORAL_TABLET | Freq: Every day | ORAL | Status: DC

## 2011-04-25 NOTE — Progress Notes (Signed)
[name], female   DOB: 1950-04-22, 61 y.o.   MRN: [id number] HPI:    1. "blood work" - patient brought  An order slip from [employer].States that she is taking Depakote. Denies any SI, HI or mania. Denies any side-effects with meds she is on. 2. Nasal congestion, subjective fever, chills and productive cough with green sputum for 1 week. Patient was seen in [facility] ED 4 days ago --was given Tussinex "but she does not feel any better." She denies any HA, CP, SOB, palpitations, swelling or rash.  Review of Systems: Negative except per history of present illness  Physical Exam:  Nursing notes and vitals reviewed General:  alert, well-developed, and cooperative to examination.   Lungs:  normal respiratory effort, no accessory muscle use, normal breath sounds, no crackles, and no wheezes. Heart:  normal rate, regular rhythm, no murmurs, no gallop, and no rub.   Abdomen:  soft, non-tender, normal bowel sounds, no distention, no guarding, no rebound tenderness, no hepatomegaly, and no splenomegaly.   Extremities:  No cyanosis, clubbing, edema Neurologic:  alert & oriented X3, nonfocal exam  Meds: Medications Prior to Admission  Medication Sig Dispense Refill  . acetaminophen (TYLENOL) 500 MG tablet Take 500 mg by mouth. Up to 6 times daily for pain       . aspirin (ASPIR-81) 81 MG EC tablet Take 81 mg by mouth daily.        Marland Kitchen atorvastatin (LIPITOR) 40 MG tablet Take 1 tablet (40 mg total) by mouth daily.  30 tablet  11  . benazepril (LOTENSIN) 20 MG tablet Take 1 tablet (20 mg total) by mouth daily.  30 tablet  6  . Blood Glucose Monitoring Suppl (PRODIGY BLOOD GLUCOSE MONITOR) DEVI Use as instructed       . calcium carbonate (OS-CAL) 600 MG TABS Take 600 mg by mouth 2 (two) times daily.        . cetirizine (ZYRTEC ALLERGY) 10 MG tablet Take 10 mg by mouth daily.        . chlorpheniramine-HYDROcodone (TUSSIONEX PENNKINETIC ER) 10-8 MG/5ML LQCR Take 5 mLs by mouth every  12 (twelve) hours as needed.  140 mL  0  . Cholecalciferol (VITAMIN D3) 1000 UNITS CAPS Take 2 tablets by mouth daily.        Marland Kitchen gabapentin (NEURONTIN) 300 MG capsule Take 300 mg by mouth at bedtime.        Marland Kitchen glucose blood (PRODIGY TEST) test strip Use as instructed       . hydrochlorothiazide (HYDRODIURIL) 25 MG tablet Take 1 tablet (25 mg total) by mouth daily.  30 tablet  6  . mometasone (NASONEX) 50 MCG/ACT nasal spray 2 sprays by Nasal route daily.  17 g  3  . omeprazole (PRILOSEC) 20 MG capsule take 2 capsules by mouth once daily  30 capsule  3  . PARoxetine (PAXIL) 20 MG tablet Take 20 mg by mouth every morning.        . traMADol (ULTRAM) 50 MG tablet take 1 tablet by mouth three times a day if needed for pain  60 tablet  1   No current facility-administered medications on file as of 04/25/2011.    Allergies: Review of patient's allergies indicates no known allergies. Past Medical History  Diagnosis Date  . Diabetes mellitus   . Hypertension   . Hyperlipidemia   . Low back pain   . Cervical spondylosis   . Obesity   . Depression  followed by Dr. Allyne Gee; no longer of depakote, seroquel  . Chronic serous otitis media   . Foot drop   . Constipation   . Paraumbilical hernia    Past Surgical History  Procedure Date  . Cholecystectomy   . Hernia repair 1999   No family history on file. History   Social History  . Marital Status: Divorced    Spouse Name: N/A    Number of Children: N/A  . Years of Education: N/A   Occupational History  . Not on file.   Social History Main Topics  . Smoking status: Former Smoker    Types: Cigarettes    Quit date: 01/17/1996  . Smokeless tobacco: Not on file  . Alcohol Use: No  . Drug Use: No  . Sexually Active:    Other Topics Concern  . Not on file   Social History Narrative  . No narrative on file    A/P: 1. Acute sinusistis -doxycycline -Flonase -avoid exposure to cold temperature -hydration, rest  2.  Pre-DM -HgbA1C -Diet, exercise, foot care reviewed -will obtain a report from Dr. Laruth Bouchard office (?glaucoma)  3. Depression -stable, Denies SI/HI or mania. -patient is being followed at [facility] -VPA, C-met and CBC with diff levels -will call [facility] to verify medication regimen  F/U in 3 months or sooner.

## 2011-04-26 LAB — COMPLETE METABOLIC PANEL WITH GFR
ALT: 11 U/L (ref 0–35)
AST: 15 U/L (ref 0–37)
Albumin: 4 g/dL (ref 3.5–5.2)
Alkaline Phosphatase: 108 U/L (ref 39–117)
BUN: 16 mg/dL (ref 6–23)
CO2: 26 mEq/L (ref 19–32)
Calcium: 10.1 mg/dL (ref 8.4–10.5)
Chloride: 102 mEq/L (ref 96–112)
Creat: 0.84 mg/dL (ref 0.50–1.10)
GFR, Est African American: 86 mL/min
GFR, Est Non African American: 74 mL/min
Glucose, Bld: 139 mg/dL — ABNORMAL HIGH (ref 70–99)
Potassium: 4.6 mEq/L (ref 3.5–5.3)
Sodium: 142 mEq/L (ref 135–145)
Total Bilirubin: 0.3 mg/dL (ref 0.3–1.2)
Total Protein: 7 g/dL (ref 6.0–8.3)

## 2011-04-26 LAB — VALPROIC ACID LEVEL: Valproic Acid Lvl: <1 ug/mL — ABNORMAL LOW (ref 50.0–100.0)

## 2011-05-24 ENCOUNTER — Other Ambulatory Visit: Payer: Self-pay | Admitting: Internal Medicine

## 2011-05-26 ENCOUNTER — Encounter: Payer: Self-pay | Admitting: Internal Medicine

## 2011-05-26 ENCOUNTER — Ambulatory Visit (INDEPENDENT_AMBULATORY_CARE_PROVIDER_SITE_OTHER): Payer: Medicare Other | Admitting: Internal Medicine

## 2011-05-26 VITALS — BP 149/79 | HR 79 | Temp 97.8°F | Ht 60.0 in | Wt 193.9 lb

## 2011-05-26 DIAGNOSIS — R42 Dizziness and giddiness: Secondary | ICD-10-CM

## 2011-05-26 DIAGNOSIS — B354 Tinea corporis: Secondary | ICD-10-CM

## 2011-05-26 DIAGNOSIS — E785 Hyperlipidemia, unspecified: Secondary | ICD-10-CM

## 2011-05-26 DIAGNOSIS — R739 Hyperglycemia, unspecified: Secondary | ICD-10-CM

## 2011-05-26 DIAGNOSIS — H409 Unspecified glaucoma: Secondary | ICD-10-CM

## 2011-05-26 DIAGNOSIS — B369 Superficial mycosis, unspecified: Secondary | ICD-10-CM

## 2011-05-26 DIAGNOSIS — E119 Type 2 diabetes mellitus without complications: Secondary | ICD-10-CM | POA: Insufficient documentation

## 2011-05-26 DIAGNOSIS — I10 Essential (primary) hypertension: Secondary | ICD-10-CM

## 2011-05-26 LAB — COMPREHENSIVE METABOLIC PANEL
ALT: 12 U/L (ref 0–35)
AST: 18 U/L (ref 0–37)
Albumin: 4.2 g/dL (ref 3.5–5.2)
Alkaline Phosphatase: 91 U/L (ref 39–117)
BUN: 15 mg/dL (ref 6–23)
CO2: 28 mEq/L (ref 19–32)
Calcium: 9.9 mg/dL (ref 8.4–10.5)
Chloride: 103 mEq/L (ref 96–112)
Creat: 0.87 mg/dL (ref 0.50–1.10)
Glucose, Bld: 94 mg/dL (ref 70–99)
Potassium: 4.3 mEq/L (ref 3.5–5.3)
Sodium: 141 mEq/L (ref 135–145)
Total Bilirubin: 0.5 mg/dL (ref 0.3–1.2)
Total Protein: 7.2 g/dL (ref 6.0–8.3)

## 2011-05-26 LAB — TSH: TSH: 0.366 u[IU]/mL (ref 0.350–4.500)

## 2011-05-26 LAB — CBC
HCT: 41.5 % (ref 36.0–46.0)
Hemoglobin: 13.3 g/dL (ref 12.0–15.0)
MCH: 28.1 pg (ref 26.0–34.0)
MCHC: 32 g/dL (ref 30.0–36.0)
MCV: 87.6 fL (ref 78.0–100.0)
Platelets: 289 10*3/uL (ref 150–400)
RBC: 4.74 MIL/uL (ref 3.87–5.11)
RDW: 13 % (ref 11.5–15.5)
WBC: 6.1 10*3/uL (ref 4.0–10.5)

## 2011-05-26 MED ORDER — CLOTRIMAZOLE 1 % EX CREA: TOPICAL_CREAM | Freq: Two times a day (BID) | CUTANEOUS | Status: AC

## 2011-05-26 NOTE — Assessment & Plan Note (Signed)
Well controlled on current treatment, No new changes made today, Will continue to monitor.   

## 2011-05-26 NOTE — Assessment & Plan Note (Signed)
Patient is experiencing some dizziness likely related to glaucoma, orthostatic vitals are negative, will check routine lab work today and refer patient back to ophthalmologist for further management.

## 2011-05-26 NOTE — Assessment & Plan Note (Signed)
Patient's last A1c was 6.8 in April 2013, managed with diet alone,  Continue to monitor, do not initiate treatment unless her A1c is above 7

## 2011-05-26 NOTE — Patient Instructions (Signed)
--   Please take your medications as prescribed.

## 2011-05-26 NOTE — Assessment & Plan Note (Signed)
LDL at goal, continue current dose of statin.

## 2011-05-26 NOTE — Assessment & Plan Note (Signed)
Presents under her breasts bilaterally, prescribe clotrimazole twice daily for one week, and reassess for resolution at next followup

## 2011-05-26 NOTE — Progress Notes (Signed)
[name], female   DOB: 06/04/1950, 61 y.o.   MRN: [id number] HPI:   Patient is a pleasant 61 year old female with a past medical history listed below, presents to the outpatient clinic for routine followup, she states that recently she saw an ophthalmologist who stated that she has glaucoma, with this she experiences blurry vision and dizziness at times. She is scheduled to see him again next week for further management. Also she complains of rash underneath her breasts has been present for the past several weeks. Otherwise she is compliant with her medications denies any other complaints  Review of Systems: Negative except per history of present illness  Physical Exam:  Nursing notes and vitals reviewed General:  alert, well-developed, and cooperative to examination.   Lungs:  normal respiratory effort, no accessory muscle use, normal breath sounds, no crackles, and no wheezes. Heart:  normal rate, regular rhythm, no murmurs, no gallop, and no rub.   Abdomen:  soft, non-tender, normal bowel sounds, no distention, no guarding, no rebound tenderness, no hepatomegaly, and no splenomegaly.   Extremities:  No cyanosis, clubbing, edema Skin: Macular rash underneath bilateral breasts, appears hyperpigmented with satellite lesions, likely fungal in origin.  Neurologic:  alert & oriented X3, nonfocal exam  Meds: Current Outpatient Prescriptions on File Prior to Visit  Medication Sig Dispense Refill  . acetaminophen (TYLENOL) 500 MG tablet Take 500 mg by mouth. Up to 6 times daily for pain       . aspirin (ASPIR-81) 81 MG EC tablet Take 81 mg by mouth daily.        Marland Kitchen atorvastatin (LIPITOR) 40 MG tablet take 1 tablet by mouth once daily  30 tablet  11  . benazepril (LOTENSIN) 20 MG tablet Take 1 tablet (20 mg total) by mouth daily.  30 tablet  6  . Blood Glucose Monitoring Suppl (PRODIGY BLOOD GLUCOSE MONITOR) DEVI Use as instructed       . calcium carbonate (OS-CAL) 600 MG TABS Take  600 mg by mouth 2 (two) times daily.        . cetirizine (ZYRTEC ALLERGY) 10 MG tablet Take 10 mg by mouth daily.        . chlorpheniramine-HYDROcodone (TUSSIONEX PENNKINETIC ER) 10-8 MG/5ML LQCR Take 5 mLs by mouth every 12 (twelve) hours as needed.  140 mL  0  . Cholecalciferol (VITAMIN D3) 1000 UNITS CAPS Take 2 tablets by mouth daily.        . divalproex (DEPAKOTE ER) 250 MG 24 hr tablet Take 4 tablets (1,000 mg total) by mouth at bedtime.  30 tablet  2  . fluticasone (FLONASE) 50 MCG/ACT nasal spray Place 1 spray into the nose daily.  15 g  2  . gabapentin (NEURONTIN) 300 MG capsule Take 300 mg by mouth at bedtime.        Marland Kitchen glucose blood (PRODIGY TEST) test strip Use as instructed       . hydrochlorothiazide (HYDRODIURIL) 25 MG tablet Take 1 tablet (25 mg total) by mouth daily.  30 tablet  6  . mometasone (NASONEX) 50 MCG/ACT nasal spray 2 sprays by Nasal route daily.  17 g  3  . omeprazole (PRILOSEC) 20 MG capsule take 2 capsules by mouth once daily  30 capsule  3  . PARoxetine (PAXIL) 20 MG tablet Take 20 mg by mouth every morning.        . sertraline (ZOLOFT) 100 MG tablet Take one and one half tablet PO daily  60 tablet  2  . traMADol (ULTRAM) 50 MG tablet take 1 tablet by mouth three times a day if needed for pain  60 tablet  1  . zolpidem (AMBIEN) 10 MG tablet Take 1 tablet (10 mg total) by mouth at bedtime as needed for sleep.  30 tablet  0    Allergies: Review of patient's allergies indicates no known allergies. Past Medical History  Diagnosis Date  . Diabetes mellitus   . Hypertension   . Hyperlipidemia   . Low back pain   . Cervical spondylosis   . Obesity   . Depression     followed by Dr. Allyne Gee; no longer of depakote, seroquel  . Chronic serous otitis media   . Foot drop   . Constipation   . Paraumbilical hernia    Past Surgical History  Procedure Date  . Cholecystectomy   . Hernia repair 1999   No family history on file. History   Social History  .  Marital Status: Divorced    Spouse Name: N/A    Number of Children: N/A  . Years of Education: N/A   Occupational History  . Not on file.   Social History Main Topics  . Smoking status: Former Smoker    Types: Cigarettes    Quit date: 01/17/1996  . Smokeless tobacco: Not on file  . Alcohol Use: No  . Drug Use: No  . Sexually Active:    Other Topics Concern  . Not on file   Social History Narrative  . No narrative on file

## 2011-05-27 LAB — URINALYSIS, ROUTINE W REFLEX MICROSCOPIC
Glucose, UA: NEGATIVE mg/dL
Hgb urine dipstick: NEGATIVE
Ketones, ur: NEGATIVE mg/dL
Nitrite: NEGATIVE
Protein, ur: NEGATIVE mg/dL
Specific Gravity, Urine: 1.023 (ref 1.005–1.030)
Urobilinogen, UA: 0.2 mg/dL (ref 0.0–1.0)
pH: 5.5 (ref 5.0–8.0)

## 2011-05-27 LAB — URINALYSIS, MICROSCOPIC ONLY
Bacteria, UA: NONE SEEN
Casts: NONE SEEN
Crystals: NONE SEEN
Squamous Epithelial / LPF: NONE SEEN

## 2011-05-29 NOTE — Progress Notes (Signed)
Addended by: Darnelle Maffucci on: 05/29/2011 02:41 PM   Modules accepted: Orders

## 2011-05-29 NOTE — Progress Notes (Signed)
Addended by: Darnelle Maffucci on: 05/29/2011 03:25 PM   Modules accepted: Orders

## 2011-07-22 ENCOUNTER — Other Ambulatory Visit: Payer: Self-pay | Admitting: Internal Medicine

## 2011-08-04 ENCOUNTER — Encounter: Payer: Self-pay | Admitting: Internal Medicine

## 2011-08-04 ENCOUNTER — Other Ambulatory Visit: Payer: Self-pay | Admitting: Internal Medicine

## 2011-08-04 ENCOUNTER — Ambulatory Visit (INDEPENDENT_AMBULATORY_CARE_PROVIDER_SITE_OTHER): Payer: Medicare Other | Admitting: Internal Medicine

## 2011-08-04 ENCOUNTER — Encounter: Payer: Medicare Other | Admitting: Internal Medicine

## 2011-08-04 VITALS — BP 109/66 | HR 71 | Temp 97.7°F | Ht 60.0 in | Wt 191.6 lb

## 2011-08-04 DIAGNOSIS — I10 Essential (primary) hypertension: Secondary | ICD-10-CM

## 2011-08-04 DIAGNOSIS — E119 Type 2 diabetes mellitus without complications: Secondary | ICD-10-CM

## 2011-08-04 DIAGNOSIS — L989 Disorder of the skin and subcutaneous tissue, unspecified: Secondary | ICD-10-CM | POA: Insufficient documentation

## 2011-08-04 DIAGNOSIS — E785 Hyperlipidemia, unspecified: Secondary | ICD-10-CM

## 2011-08-04 LAB — GLUCOSE, CAPILLARY: Glucose-Capillary: 115 mg/dL — ABNORMAL HIGH (ref 70–99)

## 2011-08-04 NOTE — Progress Notes (Signed)
[name], female   DOB: 1950-12-24, 61 y.o.   MRN: [id number]  Subjective:   [name] female   DOB: October 04, 1950 61 y.o.   MRN: [id number]  HPI: [name] is a 61 y.o. with past medical history as outlined below, who presents for an acute visit.   Patient noticed a skin lesion on the right breast. It is accidentally noticed at one month ago. It is located at the right areola area. It is black in color and slightly elevated from skin. It is triangle-shaped and proximately 3.5 cm in size. There is no pain or change in sensation. She did not notice any mass in her breast or any node in her axillary area. She did not lose weight. She had a normal mammogram done on 01/02/11.  Denies fever, chills, fatigue, headaches,  cough, chest pain, SOB,  abdominal pain,diarrhea, constipation, dysuria, urgency, frequency, hematuria.  Past Medical History  Diagnosis Date  . Diabetes mellitus   . Hypertension   . Hyperlipidemia   . Low back pain   . Cervical spondylosis   . Obesity   . Depression     followed by Dr. Allyne Gee; no longer of depakote, seroquel  . Chronic serous otitis media   . Foot drop   . Constipation   . Paraumbilical hernia    Current Outpatient Prescriptions  Medication Sig Dispense Refill  . acetaminophen (TYLENOL) 500 MG tablet Take 500 mg by mouth. Up to 6 times daily for pain       . aspirin (ASPIR-81) 81 MG EC tablet Take 81 mg by mouth daily.        Marland Kitchen atorvastatin (LIPITOR) 40 MG tablet take 1 tablet by mouth once daily  30 tablet  11  . benazepril (LOTENSIN) 20 MG tablet take 1 tablet by mouth once daily  30 tablet  3  . Blood Glucose Monitoring Suppl (PRODIGY BLOOD GLUCOSE MONITOR) DEVI Use as instructed       . calcium carbonate (OS-CAL) 600 MG TABS Take 600 mg by mouth 2 (two) times daily.        . cetirizine (ZYRTEC ALLERGY) 10 MG tablet Take 10 mg by mouth daily.        . chlorpheniramine-HYDROcodone (TUSSIONEX PENNKINETIC ER)  10-8 MG/5ML LQCR Take 5 mLs by mouth every 12 (twelve) hours as needed.  140 mL  0  . Cholecalciferol (VITAMIN D3) 1000 UNITS CAPS Take 2 tablets by mouth daily.        . clotrimazole (LOTRIMIN) 1 % cream Apply topically 2 (two) times daily. Apply to affected region under your breasts twice daily. Use for one week.  30 g  0  . divalproex (DEPAKOTE ER) 250 MG 24 hr tablet Take 4 tablets (1,000 mg total) by mouth at bedtime.  30 tablet  2  . fluticasone (FLONASE) 50 MCG/ACT nasal spray Place 1 spray into the nose daily.  15 g  2  . gabapentin (NEURONTIN) 300 MG capsule Take 300 mg by mouth at bedtime.        Marland Kitchen glucose blood (PRODIGY TEST) test strip Use as instructed       . hydrochlorothiazide (HYDRODIURIL) 25 MG tablet take 1 tablet by mouth once daily  30 tablet  3  . mometasone (NASONEX) 50 MCG/ACT nasal spray 2 sprays by Nasal route daily.  17 g  3  . omeprazole (PRILOSEC) 20 MG capsule take 2 capsules by mouth once daily  30 capsule  3  .  PARoxetine (PAXIL) 20 MG tablet Take 20 mg by mouth every morning.        . sertraline (ZOLOFT) 100 MG tablet Take one and one half tablet PO daily  60 tablet  2  . traMADol (ULTRAM) 50 MG tablet take 1 tablet by mouth three times a day if needed for pain  60 tablet  1  . zolpidem (AMBIEN) 10 MG tablet Take 1 tablet (10 mg total) by mouth at bedtime as needed for sleep.  30 tablet  0  . DISCONTD: benazepril (LOTENSIN) 20 MG tablet Take 1 tablet (20 mg total) by mouth daily.  30 tablet  6  . DISCONTD: hydrochlorothiazide (HYDRODIURIL) 25 MG tablet Take 1 tablet (25 mg total) by mouth daily.  30 tablet  6   No family history on file. History   Social History  . Marital Status: Divorced    Spouse Name: N/A    Number of Children: N/A  . Years of Education: N/A   Social History Main Topics  . Smoking status: Former Smoker    Types: Cigarettes    Quit date: 01/17/1996  . Smokeless tobacco: None  . Alcohol Use: No  . Drug Use: No  . Sexually Active:      Other Topics Concern  . None   Social History Narrative  . None   Review of Systems:  General: no fevers, chills, no changes in body weight, no changes in appetite Skin: has a skin lesion on the right breast. HEENT: no blurry vision, hearing changes or sore throat Pulm: no dyspnea, coughing, wheezing CV: no chest pain, palpitations, shortness of breath Abd: no nausea/vomiting, abdominal pain, diarrhea/constipation GU: no dysuria, hematuria, polyuria Ext: no arthralgias, myalgias Neuro: no weakness, numbness, or tingling   Objective:  Physical Exam: Filed Vitals:   08/04/11 1512  BP: 109/66  Pulse: 71  Temp: 97.7 F (36.5 C)  TempSrc: Oral  Height: 5' (1.524 m)  Weight: 191 lb 9.6 oz (86.909 kg)  SpO2: 98%   General: resting in bed, not in acute distress HEENT: PERRL, EOMI, no scleral icterus Cardiac: S1/S2, RRR, No murmurs, gallops or rubs Pulm: Good air movement bilaterally, Clear to auscultation bilaterally, No rales, wheezing, rhonchi or rubs. Abd: Soft,  nondistended, nontender, no rebound pain, no organomegaly, BS present Ext: No rashes or edema, 2+DP/PT pulse bilaterally Musculoskeletal: No joint deformities, erythema, or stiffness, ROM full and no nontender Skin: there is back colored skin lesion on the right areola area. It is black in color and slightly elevated from skin. It is triangle-shaped and proximately 3.5 cm in size. There is no sensation change in the same area. There is no axillary lymph node enlargement. There is no mass detected in her breast bilaterally Neuro: alert and oriented X3, cranial nerves II-XII grossly intact, muscle strength 5/5 in all extremeties,  sensation to light touch intact.  Psych.: patient is not psychotic, no suicidal or hemocidal ideation.   Assessment & Plan:

## 2011-08-04 NOTE — Patient Instructions (Signed)
1. Please take all medications as prescribed.  2. If you have worsening of your symptoms or new symptoms arise, please call the clinic ([phone]), or go to the ER immediately if symptoms are severe. 

## 2011-08-04 NOTE — Assessment & Plan Note (Signed)
The etiology for her skin lesion on the breast is not clear. But it doesn't seem to be malignant. The margin of skin lesion is very clear. The color of her skin lesion is universal. There is no mass detected in her breast. There is no axillary node enlargement.   -will give her referral to dermatology for further evaluation.

## 2011-08-04 NOTE — Assessment & Plan Note (Signed)
It is well controlled. Her LDL was 67 on 04/25/11. She is taking Lipitor 40 mg daily. She did not noticed any side effects. We'll continue current regimen.

## 2011-08-04 NOTE — Assessment & Plan Note (Signed)
It is well controlled. Today blood pressure is 109/66. We'll continue current regimen.

## 2011-09-04 ENCOUNTER — Ambulatory Visit (INDEPENDENT_AMBULATORY_CARE_PROVIDER_SITE_OTHER): Payer: Medicare Other | Admitting: Internal Medicine

## 2011-09-04 ENCOUNTER — Telehealth: Payer: Self-pay | Admitting: *Deleted

## 2011-09-04 ENCOUNTER — Encounter: Payer: Self-pay | Admitting: Internal Medicine

## 2011-09-04 VITALS — BP 117/75 | HR 79 | Temp 98.6°F | Ht 60.0 in | Wt 197.1 lb

## 2011-09-04 DIAGNOSIS — Z79899 Other long term (current) drug therapy: Secondary | ICD-10-CM

## 2011-09-04 DIAGNOSIS — J069 Acute upper respiratory infection, unspecified: Secondary | ICD-10-CM | POA: Insufficient documentation

## 2011-09-04 DIAGNOSIS — E785 Hyperlipidemia, unspecified: Secondary | ICD-10-CM

## 2011-09-04 DIAGNOSIS — E119 Type 2 diabetes mellitus without complications: Secondary | ICD-10-CM

## 2011-09-04 DIAGNOSIS — I10 Essential (primary) hypertension: Secondary | ICD-10-CM

## 2011-09-04 LAB — GLUCOSE, CAPILLARY: Glucose-Capillary: 117 mg/dL — ABNORMAL HIGH (ref 70–99)

## 2011-09-04 LAB — POCT GLYCOSYLATED HEMOGLOBIN (HGB A1C): Hemoglobin A1C: 7

## 2011-09-04 MED ORDER — AZITHROMYCIN 250 MG PO TABS: ORAL_TABLET | ORAL | Status: AC

## 2011-09-04 MED ORDER — HYDROCOD POLST-CHLORPHEN POLST 10-8 MG/5ML PO LQCR: 5.0000 mL | Freq: Two times a day (BID) | ORAL | Status: DC | PRN

## 2011-09-04 NOTE — Assessment & Plan Note (Signed)
This is her main complaint today.  She previously was treated with doxycycline, tussionex and an albuterol inhaler with good results.  Will give Zpack, tussionex and an inhaler today.  B/c of recurrence of symptoms, Mariah Henderson is worried about asthma or COPD.  Could consider PFTs at next visit once this acute issue is resolved, she is a former smoker.    F/U in 3 months.  Advised her to call the clinic if her symptoms are not improving within the next 3-5 days.

## 2011-09-04 NOTE — Progress Notes (Signed)
  Subjective:    [name], female    DOB: [birth year], 61 y.o.   MRN: [id number]  CC: Congestion and cough  HPI  Mariah Henderson presents for an urgent visits for symptoms of a URI. She reports a 2 day history of congestion without rhinorrhea, postnasal drip, dry cough with no sputum production, chest tightness and mild SOB while coughing.  She also notes some wheezing and difficulty sleeping because of the cough.  No pleuritic chest pain, headache, change in vision, ear or eye drainage.   Further issues included DM, which is diet controlled; HLD with last LDL of 67 check within the year and HTN with good BP control.   PMH: Reviewed and includes DM, HTN, HLD, DJD  Review of Systems  Constitutional: Negative for fever, chills, diaphoresis, activity change and appetite change.  HENT: Positive for congestion, sore throat and postnasal drip. Negative for hearing loss, ear pain, rhinorrhea, mouth sores, trouble swallowing, neck stiffness, sinus pressure and ear discharge.        Ears itching  Eyes: Negative for photophobia, pain, discharge and itching.  Respiratory: Positive for cough, chest tightness, shortness of breath and wheezing.   Cardiovascular: Negative for chest pain, palpitations and leg swelling.  Gastrointestinal: Negative for abdominal pain.  Genitourinary: Negative for difficulty urinating.  Musculoskeletal: Negative for back pain.  Skin: Negative for color change and rash.  Neurological: Negative for light-headedness.  Hematological: Negative for adenopathy.      Objective:   Physical Exam  Constitutional: She is oriented to person, place, and time. She appears well-developed and well-nourished. No distress.  HENT:  Head: Normocephalic and atraumatic.  Right Ear: External ear normal.  Left Ear: External ear normal.  Nose: Mucosal edema present. No epistaxis. Right sinus exhibits frontal sinus tenderness. Left sinus exhibits frontal sinus tenderness.    Mouth/Throat: Oropharynx is clear and moist. No oropharyngeal exudate.  Eyes: Conjunctivae and EOM are normal. Pupils are equal, round, and reactive to light. Right eye exhibits no discharge. Left eye exhibits no discharge. No scleral icterus.  Neck: Neck supple. No thyromegaly present.  Cardiovascular: Normal rate, regular rhythm and normal heart sounds.   No murmur heard. Pulmonary/Chest: Effort normal and breath sounds normal. No respiratory distress. She has no wheezes. She has no rales. She exhibits no tenderness.  Abdominal: Soft. Bowel sounds are normal.  Musculoskeletal: She exhibits no edema.  Lymphadenopathy:    She has no cervical adenopathy.  Neurological: She is alert and oriented to person, place, and time. No cranial nerve deficit.  Skin: Skin is warm and dry. She is not diaphoretic.  Psychiatric: She has a normal mood and affect.    A1C: 7.0 today on check     Assessment & Plan:  Please see problem oriented A&P

## 2011-09-04 NOTE — Assessment & Plan Note (Signed)
BP 117/75, which is at goal in this diabetic patient ( < 130/80 ).  Continue benazapril and HCTZ

## 2011-09-04 NOTE — Patient Instructions (Addendum)
It was a pleasure to meet you!  For your respiratory infection: Please take the Zpak as prescribed, tussionex and inhaler.   For your diabetes, please adhere to the diet changes that we talked about, fruits are okay, cut back on starches and refined sugars including tea, full calorie soda and candy.    Please come back in 3 months to re-check your A1C.

## 2011-09-04 NOTE — Telephone Encounter (Signed)
Can you change cough, Tussionex  to Cheratussin AC.  Pt can not afford $90 for Tussionex.

## 2011-09-04 NOTE — Assessment & Plan Note (Signed)
Patient previously taken off meds, now diet controlled.  Her A1C is increased to 7.0 up from 6.8 at last check.  Mariah Henderson was not surprised by this change, as she has added sweets and sweet tea back to her diet.  She would like to attempt lifestyle/diet changes prior to starting more medication.  Will follow up in 3 months.  Discussed the following, if A1C decreasing, will continue diet control, however, if it continues to rise, will need to start metformin at next visit.  No new changes in her feet, no previous neuropathy to note.  Recent UA revealed no proteinuria; recent eye exam revealed no retinopathy.  F/U in 3 months.

## 2011-09-04 NOTE — Assessment & Plan Note (Signed)
Labs reviewed.  Last LDL was 67 this year.  Continue atorvastatin.  Advised to decrease fried foods.

## 2011-09-06 MED ORDER — GUAIFENESIN-CODEINE 100-10 MG/5ML PO SYRP: 5.0000 mL | ORAL_SOLUTION | Freq: Three times a day (TID) | ORAL | Status: DC | PRN

## 2011-09-06 NOTE — Telephone Encounter (Signed)
Done. Thanks.

## 2011-09-06 NOTE — Addendum Note (Signed)
Addended by: Debe Coder B on: 09/06/2011 01:49 PM   Modules accepted: Orders, Medications

## 2011-09-15 ENCOUNTER — Other Ambulatory Visit: Payer: Self-pay | Admitting: *Deleted

## 2011-09-15 NOTE — Telephone Encounter (Signed)
Call from pt.  Requesting a refill on the inhaler which was given to her at her office visit- ?Ventolin. Her pharmacy is [facility].

## 2011-09-19 MED ORDER — ALBUTEROL SULFATE HFA 108 (90 BASE) MCG/ACT IN AERS: 2.0000 | INHALATION_SPRAY | Freq: Four times a day (QID) | RESPIRATORY_TRACT | Status: DC | PRN

## 2011-09-19 NOTE — Telephone Encounter (Signed)
Wrote prescription for e-prescribe.

## 2011-09-29 ENCOUNTER — Other Ambulatory Visit: Payer: Self-pay | Admitting: *Deleted

## 2011-09-29 MED ORDER — OMEPRAZOLE 20 MG PO CPDR: 40.0000 mg | DELAYED_RELEASE_CAPSULE | Freq: Every day | ORAL | Status: DC

## 2011-09-29 NOTE — Telephone Encounter (Signed)
Last refill was qty#30 instead of #60 for 2 tabs daily.

## 2011-12-05 ENCOUNTER — Other Ambulatory Visit: Payer: Self-pay | Admitting: *Deleted

## 2011-12-06 MED ORDER — HYDROCHLOROTHIAZIDE 25 MG PO TABS: 25.0000 mg | ORAL_TABLET | Freq: Every day | ORAL | Status: DC

## 2011-12-06 MED ORDER — BENAZEPRIL HCL 20 MG PO TABS: 20.0000 mg | ORAL_TABLET | Freq: Every day | ORAL | Status: DC

## 2011-12-12 ENCOUNTER — Other Ambulatory Visit: Payer: Self-pay | Admitting: *Deleted

## 2011-12-12 DIAGNOSIS — R7303 Prediabetes: Secondary | ICD-10-CM

## 2011-12-12 DIAGNOSIS — F319 Bipolar disorder, unspecified: Secondary | ICD-10-CM

## 2011-12-12 DIAGNOSIS — E119 Type 2 diabetes mellitus without complications: Secondary | ICD-10-CM

## 2011-12-12 DIAGNOSIS — J329 Chronic sinusitis, unspecified: Secondary | ICD-10-CM

## 2011-12-12 DIAGNOSIS — Z79899 Other long term (current) drug therapy: Secondary | ICD-10-CM

## 2011-12-18 MED ORDER — FLUTICASONE PROPIONATE 50 MCG/ACT NA SUSP: 1.0000 | Freq: Every day | NASAL | Status: DC

## 2012-02-01 ENCOUNTER — Encounter: Payer: Self-pay | Admitting: *Deleted

## 2012-02-01 ENCOUNTER — Telehealth: Payer: Self-pay | Admitting: Internal Medicine

## 2012-02-01 NOTE — Telephone Encounter (Signed)
Follow up on closed Referral for this patient.  Called Dr. Dorita Sciara office and spoke with front office REP Revonda Standard.  Patient originally had appointment on 09/04/2011 and resch it to 10/13/2011 and did not kepp that as well.  She has not resch.

## 2012-02-01 NOTE — Progress Notes (Unsigned)
[name], female   DOB: 1950/08/02, 62 y.o.   MRN: [id number]

## 2012-02-12 ENCOUNTER — Encounter: Payer: Medicare Other | Admitting: Internal Medicine

## 2012-03-04 ENCOUNTER — Ambulatory Visit (INDEPENDENT_AMBULATORY_CARE_PROVIDER_SITE_OTHER): Payer: Medicare Other | Admitting: Internal Medicine

## 2012-03-04 ENCOUNTER — Ambulatory Visit (HOSPITAL_COMMUNITY)
Admission: RE | Admit: 2012-03-04 | Discharge: 2012-03-04 | Disposition: A | Discharge status: Home / Self Care | Payer: Medicare Other | Source: Ambulatory Visit | Attending: Internal Medicine | Admitting: Internal Medicine

## 2012-03-04 ENCOUNTER — Encounter: Payer: Self-pay | Admitting: Internal Medicine

## 2012-03-04 VITALS — BP 130/82 | HR 69 | Temp 98.9°F | Ht 60.0 in | Wt 199.9 lb

## 2012-03-04 DIAGNOSIS — F3289 Other specified depressive episodes: Secondary | ICD-10-CM

## 2012-03-04 DIAGNOSIS — M20001 Unspecified deformity of right finger(s): Secondary | ICD-10-CM

## 2012-03-04 DIAGNOSIS — Z79899 Other long term (current) drug therapy: Secondary | ICD-10-CM

## 2012-03-04 DIAGNOSIS — J309 Allergic rhinitis, unspecified: Secondary | ICD-10-CM

## 2012-03-04 DIAGNOSIS — Z23 Encounter for immunization: Secondary | ICD-10-CM

## 2012-03-04 DIAGNOSIS — I10 Essential (primary) hypertension: Secondary | ICD-10-CM

## 2012-03-04 DIAGNOSIS — E785 Hyperlipidemia, unspecified: Secondary | ICD-10-CM

## 2012-03-04 DIAGNOSIS — R42 Dizziness and giddiness: Secondary | ICD-10-CM

## 2012-03-04 DIAGNOSIS — E119 Type 2 diabetes mellitus without complications: Secondary | ICD-10-CM

## 2012-03-04 DIAGNOSIS — H409 Unspecified glaucoma: Secondary | ICD-10-CM

## 2012-03-04 DIAGNOSIS — F329 Major depressive disorder, single episode, unspecified: Secondary | ICD-10-CM

## 2012-03-04 DIAGNOSIS — M20009 Unspecified deformity of unspecified finger(s): Secondary | ICD-10-CM

## 2012-03-04 DIAGNOSIS — Z0000 Encounter for general adult medical examination without abnormal findings: Secondary | ICD-10-CM | POA: Insufficient documentation

## 2012-03-04 LAB — GLUCOSE, CAPILLARY: Glucose-Capillary: 134 mg/dL — ABNORMAL HIGH (ref 70–99)

## 2012-03-04 LAB — POCT GLYCOSYLATED HEMOGLOBIN (HGB A1C): Hemoglobin A1C: 6.8

## 2012-03-04 MED ORDER — BENAZEPRIL HCL 20 MG PO TABS: 20.0000 mg | ORAL_TABLET | Freq: Every day | ORAL | Status: DC

## 2012-03-04 MED ORDER — HYDROCHLOROTHIAZIDE 25 MG PO TABS: 25.0000 mg | ORAL_TABLET | Freq: Every day | ORAL | Status: DC

## 2012-03-04 MED ORDER — ACETAMINOPHEN 500 MG PO TABS: 500.0000 mg | ORAL_TABLET | ORAL | Status: DC | PRN

## 2012-03-04 MED ORDER — ALBUTEROL SULFATE HFA 108 (90 BASE) MCG/ACT IN AERS: 2.0000 | INHALATION_SPRAY | Freq: Four times a day (QID) | RESPIRATORY_TRACT | Status: DC | PRN

## 2012-03-04 NOTE — Assessment & Plan Note (Signed)
BP well controlled today at 130/82.    Continue therapy with benazepril, HCTZ

## 2012-03-04 NOTE — Progress Notes (Signed)
Subjective:    [name], female    DOB: 1950/03/31, 62 y.o.   MRN: [id number]  HPI  [name] is a 62yo woman, here for routine follow up.    Her BP today is well controlled at 130/82. She taking her medications as prescribed and without any side effects noted.  She does have some dizziness upon standing occasionally.  She needs to stand up slowly.  She also has dizziness with quick movements of the head.  She has been followed by an ENT specialist for this who has treated her for an inner ear problem.  She plans to make a follow up appointment with that provider.    She notes that she has not been following a good diabetic diet during the holidays and is concerned about her weight and sugars.  She has been eating sweets and not exercising as she should.  She has a Research scientist (physical sciences) at [employer] and wants to start going to water aerobics.  She has also begun to take cinnamon pills over the counter for her DM.  She denies blurry vision, polydipsia, polyphagia.   Otherwise, she is doing well.  She is very committed to lowering her weight and increasing activity.  She was very surprised when I told her that her A1C was actually better at 6.8 today.    She sees psychiatry for all of her psych meds including zoloft, paxil, depakote and ambien.   She is not smoking.   Her medications were reviewed and updated in EPIC; including tylenol, albuterol, aspirin, lipitor, benazepril, calcium carbonate, cetirizine, vitamin D, lotrimin PRN, depakote ER, fluticasone, gabapentin, HCTZ, nasonex, omeprazole, paxil, zoloft, tramadol, ambien.    Review of Systems  Constitutional: Negative for fever, chills, activity change and fatigue.  HENT: Positive for trouble swallowing. Negative for sore throat.   Eyes: Negative for redness and visual disturbance.  Respiratory: Positive for shortness of breath. Negative for choking, chest tightness and wheezing.        Occasional short of breath, especially  when her weight is up (she has gained weight recently)  Cardiovascular: Negative for chest pain, palpitations and leg swelling.  Gastrointestinal: Negative for nausea, vomiting, abdominal pain, diarrhea, constipation and blood in stool.       + heartburn, depends on what she is eating.   Endocrine: Negative for polydipsia and polyuria.  Genitourinary: Negative for dysuria and difficulty urinating.  Musculoskeletal: Positive for joint swelling and arthralgias. Negative for gait problem.       Foot pain, due for surgery.  Has metal rod in place in foot. Rt fourth finger "stuck in place"  Neurological: Positive for dizziness. Negative for syncope, light-headedness and headaches.       Has a history of vertigo  Psychiatric/Behavioral: Negative for confusion and decreased concentration.       Objective:   Physical Exam  Constitutional: She is oriented to person, place, and time. She appears well-developed and well-nourished. No distress.  Obese  HENT:  Head: Normocephalic and atraumatic.  Eyes: EOM are normal. No scleral icterus.  Cardiovascular: Normal rate, regular rhythm, normal heart sounds and intact distal pulses.   Pulmonary/Chest: Effort normal and breath sounds normal. No respiratory distress. She has no wheezes.  Abdominal: Soft. Bowel sounds are normal. She exhibits no distension.  Musculoskeletal: She exhibits no edema and no tenderness.  + deformity to ring finger on the right hand.  She notes no recent trauma, been that way for a year.   Neurological: She  is alert and oriented to person, place, and time.  Skin: Skin is warm and dry. No erythema.  Psychiatric: She has a normal mood and affect. Her behavior is normal.   A1C: 6.8     Assessment & Plan:  RTC in 6 months, sooner if needed.

## 2012-03-04 NOTE — Patient Instructions (Addendum)
Please schedule a follow up visit within the next 6 months.   For your medications:   Please bring all of your pill  Bottles with you to each visit.  This will help make sure that we have an up to date list of all the medications you are taking.  Please also bring any over the counter herbal medications you are taking (not including advil, tylenol, etc.)  Please continue taking all of your medications as prescribed.   You will have an xray of your right hand scheduled, as well as a DEXA scan.  I will call you with results.    Thank you!

## 2012-03-04 NOTE — Assessment & Plan Note (Addendum)
Currently diet controlled as well as cinnamon pills OTC.   A1C 6.8 today LDL 67 at last check BP well controlled today at 130/82 Foot exam today, documented Renal function stable at last check Eye: due in March this year, no retinopathy at last check.   Will recheck lipids and BMET at next visit.  Since she has been well controlled, will recheck status in 6 months

## 2012-03-04 NOTE — Assessment & Plan Note (Signed)
Follows with psychiatry and on multiple medications as noted above.

## 2012-03-04 NOTE — Assessment & Plan Note (Signed)
Controlled.  She is on two inhaled medications, however, it appears the nasonex is old.  She states that she still taking both of them, however.  Will refill only fluticasone as it seems to be working for her.

## 2012-03-04 NOTE — Assessment & Plan Note (Signed)
Mammogram: Last documented 2010 which was normal, she is due in the coming month Dexa: ordered Flu: Today Colonoscopy: 2009, several polyps, apparently due for repeat in 10 years Pneumovax: 2012 Tetanus: 2008

## 2012-03-04 NOTE — Assessment & Plan Note (Signed)
Hand Xrays today.

## 2012-03-04 NOTE — Assessment & Plan Note (Signed)
At last check, LDL was 67  Continue atorvastatin.   Recheck lipids at next visit.

## 2012-04-01 ENCOUNTER — Other Ambulatory Visit: Payer: Self-pay | Admitting: Internal Medicine

## 2012-04-09 ENCOUNTER — Ambulatory Visit (INDEPENDENT_AMBULATORY_CARE_PROVIDER_SITE_OTHER): Payer: Medicare Other | Admitting: Internal Medicine

## 2012-04-09 ENCOUNTER — Encounter: Payer: Self-pay | Admitting: Internal Medicine

## 2012-04-09 VITALS — BP 118/70 | HR 85 | Temp 97.8°F | Ht 60.0 in | Wt 204.2 lb

## 2012-04-09 DIAGNOSIS — R0602 Shortness of breath: Secondary | ICD-10-CM

## 2012-04-09 DIAGNOSIS — J069 Acute upper respiratory infection, unspecified: Secondary | ICD-10-CM

## 2012-04-09 DIAGNOSIS — J309 Allergic rhinitis, unspecified: Secondary | ICD-10-CM

## 2012-04-09 DIAGNOSIS — R062 Wheezing: Secondary | ICD-10-CM

## 2012-04-09 DIAGNOSIS — E119 Type 2 diabetes mellitus without complications: Secondary | ICD-10-CM

## 2012-04-09 LAB — GLUCOSE, CAPILLARY: Glucose-Capillary: 225 mg/dL — ABNORMAL HIGH (ref 70–99)

## 2012-04-09 MED ORDER — ALBUTEROL SULFATE (5 MG/ML) 0.5% IN NEBU
2.5000 mg | INHALATION_SOLUTION | Freq: Once | RESPIRATORY_TRACT | Status: AC
  Administered 2012-04-09: 2.5 mg via RESPIRATORY_TRACT

## 2012-04-09 MED ORDER — GUAIFENESIN-CODEINE 100-10 MG/5ML PO SYRP: 5.0000 mL | ORAL_SOLUTION | Freq: Three times a day (TID) | ORAL | Status: DC | PRN

## 2012-04-09 NOTE — Assessment & Plan Note (Addendum)
Lab Results  Component Value Date   HGBA1C 6.8 03/04/2012   HGBA1C 7.0 09/04/2011   HGBA1C 6.8 04/25/2011     Assessment:  Diabetes control: good control (HgbA1C at goal)  Progress toward A1C goal:  at goal  Comments: A1C well controlled  Plan:  Medications:  continue current medications  Home glucose monitoring:   Frequency:     Timing:    Instruction/counseling given: no instruction/counseling   Educational resources provided: brochure  Self management tools provided:    Other plans: Continue dietary control

## 2012-04-09 NOTE — Assessment & Plan Note (Addendum)
She states that she has been using her zyrtec as well as her nasonex nasal spray to help with her allergies.  They have helped some with the symptoms of her URI as well so we will continue those.

## 2012-04-09 NOTE — Progress Notes (Signed)
Subjective:   [name] female   DOB: Jun 10, 1950 62 y.o.   MRN: [id number]  HPI: [name] is a 62 y.o. woman presents to clinic today with complaints about problems breathing for the last 3 days.  She states that over this time she has had some cough with nothing coming up as well as tightness in the chest.  She has noted some hoarseness in the voice with clear drainage out of her nose and down the back of her throat.  She denies any documented fevers but that she has been "hot and cold."  She also denies diarrhea, sick contacts, chest pain, itchy eyes, or hearing changes.  She has been using her albuterol inhaler which she states helps.  She is a former smoker who only smoked for 3-4 years and then quit for 6 years ago.  She has never done PFTs.    Past Medical History  Diagnosis Date  . Diabetes mellitus   . Hypertension   . Hyperlipidemia   . Low back pain   . Cervical spondylosis   . Obesity   . Depression     followed by Dr. Allyne Henderson; no longer of depakote, seroquel  . Chronic serous otitis media   . Foot drop   . Constipation   . Paraumbilical hernia    Current Outpatient Prescriptions  Medication Sig Dispense Refill  . acetaminophen (TYLENOL) 500 MG tablet Take 1 tablet (500 mg total) by mouth every 4 (four) hours as needed for pain. Do not take more than 6 times per day  40 tablet  2  . albuterol (PROVENTIL HFA;VENTOLIN HFA) 108 (90 BASE) MCG/ACT inhaler Inhale 2 puffs into the lungs every 6 (six) hours as needed for wheezing.  1 Inhaler  6  . aspirin (ASPIR-81) 81 MG EC tablet Take 81 mg by mouth daily.        Marland Kitchen atorvastatin (LIPITOR) 40 MG tablet take 1 tablet by mouth once daily  30 tablet  11  . benazepril (LOTENSIN) 20 MG tablet Take 1 tablet (20 mg total) by mouth daily.  30 tablet  3  . Blood Glucose Monitoring Suppl (PRODIGY BLOOD GLUCOSE MONITOR) DEVI Use as instructed       . calcium carbonate (OS-CAL) 600 MG TABS Take 600 mg by mouth 2 (two)  times daily.        . cetirizine (ZYRTEC ALLERGY) 10 MG tablet Take 10 mg by mouth daily.        . Cholecalciferol (VITAMIN D3) 1000 UNITS CAPS Take 2 tablets by mouth daily.        . clotrimazole (LOTRIMIN) 1 % cream Apply topically 2 (two) times daily. Apply to affected region under your breasts twice daily. Use for one week.  30 g  0  . divalproex (DEPAKOTE ER) 250 MG 24 hr tablet Take 4 tablets (1,000 mg total) by mouth at bedtime.  30 tablet  2  . fluticasone (FLONASE) 50 MCG/ACT nasal spray Place 1 spray into the nose daily.  15 g  2  . gabapentin (NEURONTIN) 300 MG capsule Take 300 mg by mouth at bedtime.        Marland Kitchen glucose blood (PRODIGY TEST) test strip Use as instructed       . hydrochlorothiazide (HYDRODIURIL) 25 MG tablet Take 1 tablet (25 mg total) by mouth daily.  30 tablet  3  . latanoprost (XALATAN) 0.005 % ophthalmic solution       . meclizine (ANTIVERT) 12.5 MG tablet       .  mometasone (NASONEX) 50 MCG/ACT nasal spray 2 sprays by Nasal route daily.  17 g  3  . omeprazole (PRILOSEC) 20 MG capsule take 2 capsules by mouth once daily  60 capsule  5  . PARoxetine (PAXIL) 20 MG tablet Take 20 mg by mouth every morning.        . sertraline (ZOLOFT) 100 MG tablet Take one and one half tablet PO daily  60 tablet  2  . traMADol (ULTRAM) 50 MG tablet take 1 tablet by mouth three times a day if needed for pain  60 tablet  1  . zolpidem (AMBIEN) 10 MG tablet Take 10 mg by mouth at bedtime as needed.       No current facility-administered medications for this visit.   No family history on file. History   Social History  . Marital Status: Divorced    Spouse Name: N/A    Number of Children: N/A  . Years of Education: N/A   Social History Main Topics  . Smoking status: Former Smoker    Types: Cigarettes    Quit date: 01/17/1996  . Smokeless tobacco: Not on file  . Alcohol Use: No  . Drug Use: No  . Sexually Active: Not on file   Other Topics Concern  . Not on file   Social  History Narrative  . No narrative on file   Review of Systems: Negative except as noted in the HPI.   Objective:  Physical Exam: Filed Vitals:   04/09/12 0945  BP: 118/70  Pulse: 85  Temp: 97.8 F (36.6 C)  TempSrc: Oral  Height: 5' (1.524 m)  Weight: 204 lb 3.2 oz (92.625 kg)  SpO2: 97%   Constitutional: Vital signs reviewed.  Patient is a well-developed and well-nourished obese woman in no acute distress and cooperative with exam. Alert and oriented x3. Her voice is hoarse throughout the interview.  Head: Normocephalic and atraumatic Ear: TM normal bilaterally Mouth: no erythema or exudates, Posterior pharynx is red with no exudates or cobblestoning.  Nose:  Turbinates boggy with clear drainage noted.  Eyes: PERRL, EOMI, conjunctivae normal, No scleral icterus.  Neck: Supple, Trachea midline normal ROM, No JVD, mass, thyromegaly, or carotid bruit present.  Cardiovascular: RRR, S1 normal, S2 normal, no MRG, pulses symmetric and intact bilaterally Pulmonary/Chest: end expiratory wheezes bilaterally. no rales, or rhonchi Abdominal: Soft. Non-tender, non-distended, bowel sounds are normal, no masses, organomegaly, or guarding present.  GU: no CVA tenderness Musculoskeletal: No joint deformities, erythema, or stiffness, ROM full and no nontender Hematology: no cervical, inginal, or axillary adenopathy.  Neurological: A&O x3, Strength is normal and symmetric bilaterally, cranial nerve II-XII are grossly intact, no focal motor deficit, sensory intact to light touch bilaterally.  Skin: Warm, dry and intact. No rash, cyanosis, or clubbing.  Psychiatric: Normal mood and affect. speech and behavior is normal. Judgment and thought content normal. Cognition and memory are normal.   Assessment & Plan:

## 2012-04-09 NOTE — Assessment & Plan Note (Signed)
She appears to have symptoms consistent with a viral URI.  There appears to be a component of a reactive airway with the wheezing noted on exam but she is currently not having hypoxia.  Albuterol nebulizer in the clinic caused a clearing of the wheezes with good relief of the symptoms.  She has no fevers or productive cough or drainage so there is no indication for antibiotics at this time.  We will treat with fluids, rest, cough suppression, and albuterol inhaler for now.  When she is better we will also need to get her set up for full PFTs to better characterize her possible underlying breathing disorder.

## 2012-04-09 NOTE — Patient Instructions (Signed)
1.  Start the Albuterol 2 puffs every 4 hours as needed for the shortness of breath while you are sick  2.  Drink plenty of fluids and get plenty of rest.  - You can use the cough syrup I prescribed to help the cough  - You can also use cough drops or throat spray to help as well.  3.  If this does not improve over the next week or 2, you start getting more coming up with coughs, or you have a fever higher then 101 F please come back.  4. Follow up with your primary care doctor in about 1 month so we can do the breathing tests if you are feeling better.

## 2012-04-15 ENCOUNTER — Encounter: Payer: Self-pay | Admitting: Internal Medicine

## 2012-04-15 ENCOUNTER — Ambulatory Visit (INDEPENDENT_AMBULATORY_CARE_PROVIDER_SITE_OTHER): Payer: Medicare Other | Admitting: Internal Medicine

## 2012-04-15 VITALS — BP 126/70 | HR 101 | Temp 97.9°F | Ht 60.0 in | Wt 203.9 lb

## 2012-04-15 DIAGNOSIS — J45909 Unspecified asthma, uncomplicated: Secondary | ICD-10-CM

## 2012-04-15 DIAGNOSIS — J069 Acute upper respiratory infection, unspecified: Secondary | ICD-10-CM

## 2012-04-15 MED ORDER — ALBUTEROL SULFATE (2.5 MG/3ML) 0.083% IN NEBU: 2.5000 mg | INHALATION_SOLUTION | Freq: Four times a day (QID) | RESPIRATORY_TRACT | Status: DC | PRN

## 2012-04-15 MED ORDER — ALBUTEROL SULFATE (5 MG/ML) 0.5% IN NEBU
2.5000 mg | INHALATION_SOLUTION | Freq: Once | RESPIRATORY_TRACT | Status: AC
  Administered 2012-04-15: 2.5 mg via RESPIRATORY_TRACT

## 2012-04-15 MED ORDER — PREDNISONE 20 MG PO TABS: 40.0000 mg | ORAL_TABLET | Freq: Every day | ORAL | Status: DC

## 2012-04-15 MED ORDER — IPRATROPIUM BROMIDE 0.02 % IN SOLN
0.5000 mg | Freq: Once | RESPIRATORY_TRACT | Status: AC
  Administered 2012-04-15: 0.5 mg via RESPIRATORY_TRACT

## 2012-04-15 MED ORDER — IPRATROPIUM BROMIDE 0.02 % IN SOLN: 0.5000 mg | RESPIRATORY_TRACT | Status: DC | PRN

## 2012-04-15 MED ORDER — GLUCOSE BLOOD VI STRP: ORAL_STRIP | Status: DC

## 2012-04-15 NOTE — Progress Notes (Signed)
Subjective:   [name] female   DOB: 07-27-1950 62 y.o.   MRN: [id number]  HPI: [name] is a 62 y.o. woman presents today for follow up from her last appointment.  She states that since she was seen last Tuesday that she has continued to cough with a lot of chest tightness and wheezing.  She denies fevers, chills, nausea, vomiting, or abdominal pain.  She has been using the cough syrup and the albuterol inhaler which she states has helped.  She has been using it every 6 hours.  She states that she is very tired from all the coughing and has only been able to occasionally cough up "yellow stuff."    Past Medical History  Diagnosis Date  . Diabetes mellitus   . Hypertension   . Hyperlipidemia   . Low back pain   . Cervical spondylosis   . Obesity   . Depression     followed by Dr. Allyne Gee; no longer of depakote, seroquel  . Chronic serous otitis media   . Foot drop   . Constipation   . Paraumbilical hernia    Current Outpatient Prescriptions  Medication Sig Dispense Refill  . acetaminophen (TYLENOL) 500 MG tablet Take 1 tablet (500 mg total) by mouth every 4 (four) hours as needed for pain. Do not take more than 6 times per day  40 tablet  2  . albuterol (PROVENTIL HFA;VENTOLIN HFA) 108 (90 BASE) MCG/ACT inhaler Inhale 2 puffs into the lungs every 6 (six) hours as needed for wheezing.  1 Inhaler  6  . aspirin (ASPIR-81) 81 MG EC tablet Take 81 mg by mouth daily.        Marland Kitchen atorvastatin (LIPITOR) 40 MG tablet take 1 tablet by mouth once daily  30 tablet  11  . benazepril (LOTENSIN) 20 MG tablet Take 1 tablet (20 mg total) by mouth daily.  30 tablet  3  . Blood Glucose Monitoring Suppl (PRODIGY BLOOD GLUCOSE MONITOR) DEVI Use as instructed       . calcium carbonate (OS-CAL) 600 MG TABS Take 600 mg by mouth 2 (two) times daily.        . cetirizine (ZYRTEC ALLERGY) 10 MG tablet Take 10 mg by mouth daily.        . Cholecalciferol (VITAMIN D3) 1000 UNITS CAPS Take 2  tablets by mouth daily.        . clotrimazole (LOTRIMIN) 1 % cream Apply topically 2 (two) times daily. Apply to affected region under your breasts twice daily. Use for one week.  30 g  0  . divalproex (DEPAKOTE ER) 250 MG 24 hr tablet Take 4 tablets (1,000 mg total) by mouth at bedtime.  30 tablet  2  . gabapentin (NEURONTIN) 300 MG capsule Take 300 mg by mouth at bedtime.        Marland Kitchen glucose blood (PRODIGY TEST) test strip Use as instructed       . guaiFENesin-codeine (ROBITUSSIN AC) 100-10 MG/5ML syrup Take 5 mLs by mouth 3 (three) times daily as needed for cough.  120 mL  0  . hydrochlorothiazide (HYDRODIURIL) 25 MG tablet Take 1 tablet (25 mg total) by mouth daily.  30 tablet  3  . latanoprost (XALATAN) 0.005 % ophthalmic solution       . meclizine (ANTIVERT) 12.5 MG tablet       . mometasone (NASONEX) 50 MCG/ACT nasal spray 2 sprays by Nasal route daily.  17 g  3  . omeprazole (PRILOSEC)  20 MG capsule take 2 capsules by mouth once daily  60 capsule  5  . PARoxetine (PAXIL) 20 MG tablet Take 20 mg by mouth every morning.        . traMADol (ULTRAM) 50 MG tablet take 1 tablet by mouth three times a day if needed for pain  60 tablet  1  . zolpidem (AMBIEN) 10 MG tablet Take 10 mg by mouth at bedtime as needed.       No current facility-administered medications for this visit.   No family history on file. History   Social History  . Marital Status: Divorced    Spouse Name: N/A    Number of Children: N/A  . Years of Education: N/A   Social History Main Topics  . Smoking status: Former Smoker    Types: Cigarettes    Quit date: 01/17/1996  . Smokeless tobacco: None  . Alcohol Use: No  . Drug Use: No  . Sexually Active: None   Other Topics Concern  . None   Social History Narrative  . None   Review of Systems: Constitutional: positive for fatigue.  Denies fever, chills, diaphoresis, appetite change.  HEENT: Denies photophobia, eye pain, redness, hearing loss, ear pain,  congestion, sore throat, rhinorrhea, sneezing, mouth sores, trouble swallowing, neck pain, neck stiffness and tinnitus.   Respiratory: Positive for SOB, DOE, cough, chest tightness,  and wheezing.   Cardiovascular: Denies chest pain, palpitations and leg swelling.  Gastrointestinal: Denies nausea, vomiting, abdominal pain, diarrhea, constipation, blood in stool and abdominal distention.  Genitourinary: Denies dysuria, urgency, frequency, hematuria, flank pain and difficulty urinating.  Musculoskeletal: Denies myalgias, back pain, joint swelling, arthralgias and gait problem.  Skin: Denies pallor, rash and wound.  Neurological: Denies dizziness, seizures, syncope, weakness, light-headedness, numbness and headaches.  Hematological: Denies adenopathy. Easy bruising, personal or family bleeding history  Psychiatric/Behavioral: Denies suicidal ideation, mood changes, confusion, nervousness, sleep disturbance and agitation  Objective:  Physical Exam: Filed Vitals:   04/15/12 1424  BP: 126/70  Pulse: 101  Temp: 97.9 F (36.6 C)  TempSrc: Oral  Height: 5' (1.524 m)  Weight: 203 lb 14.4 oz (92.488 kg)  SpO2: 94%   Constitutional: Vital signs reviewed.  Patient is a well-developed and well-nourished woman in mild distress from coughing and SOB. She is cooperative with exam. Alert and oriented x3.  Head: Normocephalic and atraumatic Ear: TM normal bilaterally Mouth: no erythema or exudates, MMM Eyes: PERRL, EOMI, conjunctivae normal, No scleral icterus.  Neck: Supple, Trachea midline normal ROM, No JVD, mass, thyromegaly, or carotid bruit present.  Cardiovascular: tachycardic but regular rhythm, S1 normal, S2 normal, no MRG, pulses symmetric and intact bilaterally Pulmonary/Chest: Course bilateral scattered expiratory wheezes.  Little air movement without inducing cough.  No rales, or rhonchi Examined post albuterol/atrovent nebulizer:  Abdominal: Soft. Non-tender, non-distended, bowel sounds  are normal, no masses, organomegaly, or guarding present.  GU: no CVA tenderness Musculoskeletal: No joint deformities, erythema, or stiffness, ROM full and no nontender Hematology: no cervical, inginal, or axillary adenopathy.  Neurological: A&O x3, Strength is normal and symmetric bilaterally, cranial nerve II-XII are grossly intact, no focal motor deficit, sensory intact to light touch bilaterally.  Skin: Warm, dry and intact. No rash, cyanosis, or clubbing.  Psychiatric: Normal mood and affect. speech and behavior is normal. Judgment and thought content normal. Cognition and memory are normal.   Assessment & Plan:

## 2012-04-15 NOTE — Patient Instructions (Signed)
1.  [facility] should call you today to deliver the Nebulizer machine  2.  Stop by the pharmacy and pick up the nebulizer solution for the albuterol and atrovent.  Mix them together and take them every 6 hours as needed for the shortness of breath  3.  Start Prednisone 20 mg tablets.  Take 2 tablets daily for the next 7 days.  4.  Follow up in about 1-2 weeks to see who you are doing.   - If your breathing gets worse or you have a time when you can not catch your breath you need to call 911.

## 2012-04-15 NOTE — Assessment & Plan Note (Signed)
We will continue to treat with cough suppressants as well as anti-histamine as well.  With the lack of purulent sputum there is no indication for antibiotics at this time.  She was cautioned that if she developed a fever or starts coughing up copious brown or green sputum to call right away.

## 2012-04-15 NOTE — Assessment & Plan Note (Signed)
She has a history of problems with wheezing and prolonged cough after URI symptoms as well as during allergy season.  She has not done PFTs in the past but has had some relief from Albuterol inhalers. Given her poor air movement today and the marked improvement in symptoms with the nebulizer treatment we will work to send her home with a nebulizer, albuterol and atrovent nebulizers to use for now.  We will also give her a short course of steroids to use to settle the inflammation.  She was informed that this will make her blood sugars go up and to check her blood sugars at least twice daily while she is on the prednisone.

## 2012-04-16 LAB — GLUCOSE, CAPILLARY: Glucose-Capillary: 218 mg/dL — ABNORMAL HIGH (ref 70–99)

## 2012-04-17 ENCOUNTER — Other Ambulatory Visit: Payer: Self-pay | Admitting: *Deleted

## 2012-04-17 DIAGNOSIS — E119 Type 2 diabetes mellitus without complications: Secondary | ICD-10-CM

## 2012-04-17 MED ORDER — ONETOUCH ULTRA MINI W/DEVICE KIT: PACK | Status: DC

## 2012-04-17 MED ORDER — ONETOUCH LANCETS MISC: Status: DC

## 2012-04-17 MED ORDER — GLUCOSE BLOOD VI STRP: ORAL_STRIP | Status: DC

## 2012-04-17 NOTE — Telephone Encounter (Signed)
Fax from pharmacy - Prodigy strips and lancets are no longer available. Pt will need new rxs for meter, lancets and test strips sent to he pharmacy. Please include dx code and specific instructions (how many times to test daily).  Lupita Leash Pyler recommended One Touch Ultra mini. Please check refill instructions. Thanks

## 2012-04-29 ENCOUNTER — Other Ambulatory Visit: Payer: Self-pay | Admitting: Internal Medicine

## 2012-04-29 ENCOUNTER — Ambulatory Visit (INDEPENDENT_AMBULATORY_CARE_PROVIDER_SITE_OTHER): Payer: Medicare Other | Admitting: Internal Medicine

## 2012-04-29 ENCOUNTER — Encounter: Payer: Self-pay | Admitting: Internal Medicine

## 2012-04-29 VITALS — BP 108/64 | HR 77 | Temp 97.4°F | Ht 60.0 in | Wt 204.1 lb

## 2012-04-29 DIAGNOSIS — J069 Acute upper respiratory infection, unspecified: Secondary | ICD-10-CM

## 2012-04-29 DIAGNOSIS — I10 Essential (primary) hypertension: Secondary | ICD-10-CM

## 2012-04-29 DIAGNOSIS — E119 Type 2 diabetes mellitus without complications: Secondary | ICD-10-CM

## 2012-04-29 DIAGNOSIS — J309 Allergic rhinitis, unspecified: Secondary | ICD-10-CM

## 2012-04-29 DIAGNOSIS — E785 Hyperlipidemia, unspecified: Secondary | ICD-10-CM

## 2012-04-29 DIAGNOSIS — E669 Obesity, unspecified: Secondary | ICD-10-CM

## 2012-04-29 DIAGNOSIS — J45909 Unspecified asthma, uncomplicated: Secondary | ICD-10-CM

## 2012-04-29 DIAGNOSIS — J302 Other seasonal allergic rhinitis: Secondary | ICD-10-CM

## 2012-04-29 LAB — GLUCOSE, CAPILLARY: Glucose-Capillary: 172 mg/dL — ABNORMAL HIGH (ref 70–99)

## 2012-04-29 MED ORDER — LORATADINE 10 MG PO TABS: 10.0000 mg | ORAL_TABLET | Freq: Every day | ORAL | Status: DC

## 2012-04-29 NOTE — Patient Instructions (Addendum)
General Instructions:  Please follow-up at the clinic in 2 months, at which time we will reevaluate diabetes and hypertension - OR, please follow-up in the clinic sooner if needed.  There have been changes in your medications:  STOP Zyrtec  START Claritin for your allergies   Start checking your blood sugar once daily or if needed when you feel bad.  Please get your pulmonary function tests as soon as you can.  If you have been started on new medication(s), and you develop symptoms concerning for allergic reaction, including, but not limited to, throat closing, tongue swelling, rash, please stop the medication immediately and call the clinic at [phone], and go to the ER.  If you are diabetic, please bring your meter to your next visit.  If symptoms worsen, or new symptoms arise, please call the clinic or go to the ER.  PLEASE BRING ALL OF YOUR MEDICATIONS  IN A BAG TO YOUR NEXT APPOINTMENT   Treatment Goals:  Goals (1 Years of Data) as of 04/29/12         As of Today 04/15/12 04/09/12 03/04/12 09/04/11     Blood Pressure    . Blood Pressure < 140/90  108/64 126/70 118/70 130/82 117/75     Diet    . Eat 3 fruit servings each day           Result Component    . HEMOGLOBIN A1C < 7.0     6.8 7.0    . LDL CALC < 100            Progress Toward Treatment Goals:  Treatment Goal 04/29/2012  Hemoglobin A1C at goal  Blood pressure at goal    Self Care Goals & Plans:  Self Care Goal 04/15/2012  Manage my medications take my medicines as prescribed; bring my medications to every visit; refill my medications on time; follow the sick day instructions if I am sick  Monitor my health keep track of my blood glucose; keep track of my weight; check my feet daily  Eat healthy foods eat more vegetables; eat fruit for snacks and desserts; eat baked foods instead of fried foods; eat foods that are low in salt; eat smaller portions; drink diet soda or water instead of juice or soda  Be  physically active take a walk every day; find an activity I enjoy    Home Blood Glucose Monitoring 04/29/2012  Check my blood sugar once a day  When to check my blood sugar before breakfast     Care Management & Community Referrals:  Referral 04/09/2012  Referrals made for care management support none needed

## 2012-04-29 NOTE — Assessment & Plan Note (Signed)
Assessment: This issue has resolved. She does indicate occasional shortness of breath with going outside, particularly with either dust or pollen exposure. I do not think this is reflective of prolonged URI or recurrent infection.  Plan:      No additional treatment needed at this time.

## 2012-04-29 NOTE — Progress Notes (Signed)
INTERNAL MEDICINE TEACHING ATTENDING ADDENDUM - Mariah Catalina, MD: I reviewed with the resident Dr. Saralyn Pilar, Ms. Tallie's  medical history, physical examination, diagnosis and results of tests and treatment and I agree with the patient's care as documented.

## 2012-04-29 NOTE — Assessment & Plan Note (Signed)
Pertinent Data: BP Readings from Last 3 Encounters:  04/29/12 108/64  04/15/12 126/70  04/09/12 118/70    Basic Metabolic Panel:    Component Value Date/Time   NA 141 05/26/2011 1437   K 4.3 05/26/2011 1437   CL 103 05/26/2011 1437   CO2 28 05/26/2011 1437   BUN 15 05/26/2011 1437   CREATININE 0.87 05/26/2011 1437   CREATININE 0.98 10/22/2009 2157   GLUCOSE 94 05/26/2011 1437   CALCIUM 9.9 05/26/2011 1437    Assessment: Disease Control: controlled  Progress toward goals: at goal  Barriers to meeting goals: no barriers identified    Patient is compliant most of the time with prescribed medications.   Plan:  continue current medications  Educational resources provided:    Self management tools provided:  handout

## 2012-04-29 NOTE — Assessment & Plan Note (Signed)
Assessment: Patient indicates history of seasonal allergies and multiple environmental allergies including dust and approaches. She has previously seen an allergist and had appropriate allergy testing. She is chronically on nasal steroids and antihistamine. However, she continues to have shortness of breath symptoms this year.  Plan:      Will continue nasal steroids.  DC Zyrtec.   Start Claritin.   Obtain PFTs to evaluate if her shortness of breath symptoms may be secondary to underlying lung disease.

## 2012-04-29 NOTE — Assessment & Plan Note (Signed)
Pertinent Labs: Lab Results  Component Value Date   HGBA1C 6.8 03/04/2012   HGBA1C 6.5 10/22/2009   CREATININE 0.87 05/26/2011   CREATININE 0.98 10/22/2009   MICROALBUR 0.91 09/18/2008   MICRALBCREAT 4.8 09/18/2008   CHOL 134 04/25/2011   HDL 52 04/25/2011   TRIG 74 06/18/1306    Assessment: Disease Control: good control (HgbA1C at goal)  Progress toward goals: at goal  Barriers to meeting goals: no barriers identified    Not currently on any therapy.   Plan: Glucometer log was not reviewed today, as pt did not have glucometer available for review.   No medications needed at this time.  Weight loss discussed.  Lipid panel today.  reminded to bring blood glucose meter & log to each visit  Educational resources provided:    Self management tools provided:    Home glucose monitoring recommendation: once a day before breakfast and PRN - does not need to check 3 times a day regularly.

## 2012-04-29 NOTE — Assessment & Plan Note (Signed)
Pertinent Labs: Liver Function Tests:    Component Value Date/Time   AST 18 05/26/2011 1437   ALT 12 05/26/2011 1437   ALKPHOS 91 05/26/2011 1437   BILITOT 0.5 05/26/2011 1437   PROT 7.2 05/26/2011 1437   ALBUMIN 4.2 05/26/2011 1437    Lipid Panel:     Component Value Date/Time   CHOL 134 04/25/2011 0955   TRIG 74 04/25/2011 0955   HDL 52 04/25/2011 0955   CHOLHDL 2.6 04/25/2011 0955   VLDL 15 04/25/2011 0955   LDLCALC 67 04/25/2011 0955     Assessment: Goal LDL (per ATP guidelines): < 100 mg/dL  Disease Control: controlled  Progress toward goals: at goal  Barriers to meeting goals: no barriers identified    Patient is not fasting today.   Patient is compliant most of the time with prescribed medications.    Plan:  continue current medications  Check lipid panel today.

## 2012-04-29 NOTE — Progress Notes (Signed)
Patient: Mariah Henderson   MRN: [id number]  DOB: May 23, 1950  PCP: Inez Catalina, MD   Subjective:    HPI: Ms. Brynlynn Walko is a 62 y.o. female with a PMHx as outlined below, who presented to clinic today for the following:  1) URI - patient is being evaluated for followup of URI. Patient presented to clinic on 3/25 and 3/31 with complaints of complains of dry cough and shortness of breath for 3 days prior to initial evaluation. This was thought likely secondary to viral URI. The patient was treated with nebulizer therapy in clinic to treat her wheezing. She was recommended cough suppressant, albuterol inhaler, increased fluids at home. She returned for reevaluation on 3/31, at which time, she indicated persistent shortness of breath, wheezing, and now productive cough with yellow sputum. She was treated with oral prednisone burst and antitussives. She was not treated with antibiotics.  Today, the patient indicates, that her symptoms have resolved. She denies a history of chills, fevers, shortness of breath, wheezing, cough and sputum production and denies a history of asthma. Patient denies current use of cigarettes, however, did previously smoke - having quit several years ago.  2) Seasonal allergies - Patient indicates that she does have significant seasonal allergies once she comes in contact with pollen. Therefore, she is using a mask, she finds this to be helpful. Triggers for her shortness of breath include: dust, pollen. She is using her albuterol nebulizer twice daily during this allergy season. Is already  3) HTN - Patient does not check blood pressure regularly at home. Currently taking Benazepril 20mg  and HCTZ 25mg  daily. Patient misses doses 0 x per week on average. denies headaches, dizziness, lightheadedness, chest pain, shortness of breath.  does request refills today.   4) DM2, controlled - diet controlled Lab Results  Component Value Date   HGBA1C 6.8 03/04/2012   Patient  checking blood sugars 3 times daily, before breakfast, lunch and dinner. Reports fasting blood sugars of 100-110 mg/dL. Currently taking no medication. 0 hypoglycemic episodes since last visit. Denies assisted hypoglycemia or recently hospitalizations for either hyper or hypoglycemia. denies polyuria, polydipsia, nausea, vomiting, diarrhea.     Review of Systems: Per HPI.    Current Outpatient Medications: Medication Sig  . acetaminophen (TYLENOL) 500 MG tablet Take 1 tablet (500 mg total) by mouth every 4 (four) hours as needed for pain. Do not take more than 6 times per day  . albuterol (PROVENTIL HFA;VENTOLIN HFA) 108 (90 BASE) MCG/ACT inhaler Inhale 2 puffs into the lungs every 6 (six) hours as needed for wheezing.  Marland Kitchen albuterol (PROVENTIL) (2.5 MG/3ML) 0.083% nebulizer solution Take 3 mLs (2.5 mg total) by nebulization every 6 (six) hours as needed for wheezing.  Marland Kitchen aspirin (ASPIR-81) 81 MG EC tablet Take 81 mg by mouth daily.    Marland Kitchen atorvastatin (LIPITOR) 40 MG tablet Take 1 tablet (40 mg total) by mouth daily.  . benazepril (LOTENSIN) 20 MG tablet Take 1 tablet (20 mg total) by mouth daily.  . Blood Glucose Monitoring Suppl (ONE TOUCH ULTRA MINI) W/DEVICE KIT Use to check blood sugars once daily. Dx code: 250.00.  Marland Kitchen Blood Glucose Monitoring Suppl (PRODIGY BLOOD GLUCOSE MONITOR) DEVI Use as instructed   . calcium carbonate (OS-CAL) 600 MG TABS Take 600 mg by mouth 2 (two) times daily.    . cetirizine (ZYRTEC ALLERGY) 10 MG tablet Take 10 mg by mouth daily.    . Cholecalciferol (VITAMIN D3) 1000 UNITS CAPS Take 2 tablets by  mouth daily.    . clotrimazole (LOTRIMIN) 1 % cream Apply topically 2 (two) times daily. Apply to affected region under your breasts twice daily. Use for one week.  . divalproex (DEPAKOTE ER) 250 MG 24 hr tablet Take 4 tablets (1,000 mg total) by mouth at bedtime.  . gabapentin (NEURONTIN) 300 MG capsule Take 300 mg by mouth at bedtime.    Marland Kitchen glucose blood test strip Use to  check blood sugars once daily.  Dx code: 250.00.  . hydrochlorothiazide (HYDRODIURIL) 25 MG tablet Take 1 tablet (25 mg total) by mouth daily.  Marland Kitchen ipratropium (ATROVENT) 0.02 % nebulizer solution Take 2.5 mLs (0.5 mg total) by nebulization every 4 (four) hours as needed for wheezing.  . latanoprost (XALATAN) 0.005 % ophthalmic solution   . meclizine (ANTIVERT) 12.5 MG tablet Take 12.5 mg by mouth 3 (three) times daily as needed.   . mometasone (NASONEX) 50 MCG/ACT nasal spray 2 sprays by Nasal route daily.  Marland Kitchen omeprazole (PRILOSEC) 20 MG capsule take 2 capsules by mouth once daily  . ONE TOUCH LANCETS MISC Use to check blood sugars once daily. Dx code: 250.00.  Marland Kitchen PARoxetine (PAXIL) 20 MG tablet Take 20 mg by mouth every morning.    . predniSONE (DELTASONE) 20 MG tablet Take 2 tablets (40 mg total) by mouth daily.  . traMADol (ULTRAM) 50 MG tablet take 1 tablet by mouth three times a day if needed for pain  . zolpidem (AMBIEN) 10 MG tablet Take 10 mg by mouth at bedtime as needed.    Allergies: No Known Allergies   Past Medical History  Diagnosis Date  . Diabetes mellitus   . Hypertension   . Hyperlipidemia   . Low back pain   . Cervical spondylosis   . Obesity   . Depression     followed by Dr. Allyne Gee; no longer of depakote, seroquel  . Chronic serous otitis media   . Foot drop   . Constipation   . Paraumbilical hernia     Objective:    Physical Exam: Filed Vitals:   04/29/12 1316  BP: 108/64  Pulse: 77     General: Vital signs reviewed and noted. Well-developed, well-nourished, in no acute distress; alert, appropriate and cooperative throughout examination.  Head: Normocephalic, atraumatic.  Eyes: conjunctivae/corneas clear. PERRL, EOM's intact.   Ears: TM nonerythematous, not bulging, good light reflex bilaterally.  Nose: Mucous membranes moist, not inflammed, nonerythematous.  Throat: Oropharynx nonerythematous, no exudate appreciated.   Neck: No deformities,  masses, or tenderness noted.  Lungs:  Normal respiratory effort. Clear to auscultation BL without crackles or wheezes.  Heart: RRR. S1 and S2 normal without gallop, rubs. No murmur.  Abdomen:  BS normoactive. Soft, Nondistended, non-tender.  No masses or organomegaly.  Extremities: No pretibial edema.     Assessment/ Plan:   The patient's case and plan of care was discussed with attending physician, Dr. Debe Coder.

## 2012-04-30 LAB — LIPID PANEL
Cholesterol: 136 mg/dL (ref 0–200)
HDL: 52 mg/dL (ref >39)
LDL Cholesterol: 73 mg/dL (ref 0–99)
Total CHOL/HDL Ratio: 2.6 Ratio
Triglycerides: 55 mg/dL (ref <150)
VLDL: 11 mg/dL (ref 0–40)

## 2012-05-07 ENCOUNTER — Ambulatory Visit (HOSPITAL_COMMUNITY)
Admission: RE | Admit: 2012-05-07 | Discharge: 2012-05-07 | Disposition: A | Discharge status: Home / Self Care | Payer: Medicare Other | Source: Ambulatory Visit | Attending: Internal Medicine | Admitting: Internal Medicine

## 2012-05-07 DIAGNOSIS — J45909 Unspecified asthma, uncomplicated: Secondary | ICD-10-CM

## 2012-05-07 DIAGNOSIS — R062 Wheezing: Secondary | ICD-10-CM | POA: Insufficient documentation

## 2012-05-07 MED ORDER — ALBUTEROL SULFATE (5 MG/ML) 0.5% IN NEBU
2.5000 mg | INHALATION_SOLUTION | Freq: Once | RESPIRATORY_TRACT | Status: AC
  Administered 2012-05-07: 2.5 mg via RESPIRATORY_TRACT

## 2012-05-13 ENCOUNTER — Telehealth: Payer: Self-pay | Admitting: *Deleted

## 2012-05-13 NOTE — Telephone Encounter (Signed)
Message from pt - she wants result of "breathing test"; she had PFT"S done 05/07/12. Thanks

## 2012-05-16 ENCOUNTER — Telehealth: Payer: Self-pay | Admitting: Internal Medicine

## 2012-05-16 NOTE — Telephone Encounter (Signed)
I called and spoke to [name] re: her PFTs.  I told her that I could not access the report just yet and that I would call her when I could.    If I do not see a report by the end of the week, will plan to call the PFT lab.

## 2012-05-16 NOTE — Telephone Encounter (Signed)
Called and spoke to [name].  I see that the PFTs were done, but cannot access the report.   Glenda - I tried looking in Notes, like you have shown me before, but the report wasn't there.  Do you think it's been uploaded by the PFT lab?   Thanks!

## 2012-05-17 NOTE — Telephone Encounter (Signed)
I called [facility] and they faxed a copy of her PFT's result to me. 0I will put it in your box in the clinic.

## 2012-05-23 ENCOUNTER — Telehealth: Payer: Self-pay | Admitting: Internal Medicine

## 2012-05-23 NOTE — Telephone Encounter (Signed)
Called to discuss PFT results, left message on home number.

## 2012-05-29 ENCOUNTER — Telehealth: Payer: Self-pay | Admitting: Internal Medicine

## 2012-05-29 NOTE — Telephone Encounter (Signed)
Called and updated Mariah Henderson on her PFT results.   Reported to her that she has mild obstruction with concomitant restriction.  Her breathing is improved with nebulizer treatments which she takes a couple of times a day.  Her breathing has worsened due to the pollen but has been improving.  She is off steroids.   She may benefit from ipratropium or spiriva.  Will address at next visit.   Mariah Henderson expressed understanding.

## 2012-06-03 ENCOUNTER — Encounter: Payer: Self-pay | Admitting: Internal Medicine

## 2012-06-03 ENCOUNTER — Ambulatory Visit (INDEPENDENT_AMBULATORY_CARE_PROVIDER_SITE_OTHER): Payer: Medicare Other | Admitting: Internal Medicine

## 2012-06-03 VITALS — BP 130/80 | HR 84 | Temp 99.4°F | Ht 60.0 in | Wt 201.9 lb

## 2012-06-03 DIAGNOSIS — I10 Essential (primary) hypertension: Secondary | ICD-10-CM

## 2012-06-03 DIAGNOSIS — J45909 Unspecified asthma, uncomplicated: Secondary | ICD-10-CM

## 2012-06-03 LAB — GLUCOSE, CAPILLARY: Glucose-Capillary: 136 mg/dL — ABNORMAL HIGH (ref 70–99)

## 2012-06-03 MED ORDER — AZITHROMYCIN 250 MG PO TABS: ORAL_TABLET | ORAL | Status: DC

## 2012-06-03 MED ORDER — PREDNISONE 20 MG PO TABS: 40.0000 mg | ORAL_TABLET | Freq: Every day | ORAL | Status: DC

## 2012-06-03 NOTE — Progress Notes (Signed)
  Subjective:    [name], female    DOB: 07-22-1950, 62 y.o.   MRN: [id number]  HPI patient is a pleasant 62 year woman with DM 2, hyperlipidemia, hypertension, allergic rhinitis and other problems as per problem list who comes to the clinic for worsening cough with sputum production.  Patient has been dealing with upper respiratory symptoms since March this year. She has had worst seasonal allergies this year. She did not have a productive cough until Saturday when she started coughing yellow to greenish thick sputum. She describes paroxysms of cough which does not ended vomiting. Although she does have chest tightness with the spells. She feels minimally short of breath with the coughing spell. Although she does not get short of breath with ambulation or at rest.  She does feel hot and cold but did not take her temperature. Denies any change in appetite, nausea vomiting, headache, palpitations, vision changes, diarrhea, abdominal pain.  She has had PFTs recently- results are not available in the computer yet, but apparently she had mild obstruction with some restriction.  Review of Systems    as per history of present illness. Objective:   Physical Exam  General: Intermittently coughing. HEENT: PERRL, EOMI, no scleral icterus Cardiac: S1, S2, RRR, no rubs, murmurs or gallops Pulm: Mild diffuse bilateral expiratory wheezes upper lung fields, moving normal volumes of air. Abd: soft, nontender, nondistended, BS present Ext: warm and well perfused, no pedal edema Neuro: alert and oriented X3, cranial nerves II-XII grossly intact       Assessment & Plan:

## 2012-06-03 NOTE — Assessment & Plan Note (Signed)
BP Readings from Last 3 Encounters:  06/03/12 130/80  04/29/12 108/64  04/15/12 126/70    Lab Results  Component Value Date   NA 141 05/26/2011   K 4.3 05/26/2011   CREATININE 0.87 05/26/2011    Assessment: Blood pressure control:   at goal Progress toward BP goal:    stable Comments:   Plan: Medications:  continue current medications, Benazepril and HCTZ Educational resources provided:   Self management tools provided:   Other plans: No new changes in medications today.

## 2012-06-03 NOTE — Patient Instructions (Signed)
Please make followup appointment in 7-10 days.  Start taking azithromycin 500 mg today ( total 2 pills) and then 250 mg daily for next 4 days to complete 5 days therapy. Also start taking prednisone 40 mg daily (2 pills) 4 total 5 days.   Continue using the nebulizer.  Continue using all other medications regularly.

## 2012-06-03 NOTE — Assessment & Plan Note (Signed)
Unclear what is the diagnosis of for reactive airway disease- COPD or asthma. Patient is not a smoker now- quit in 1998. She does have severe allergies which is worse this season. Although she does describe worsening cough with change in mucus, with some chest tightness and shortness of breath- with two out of three criteria for COPD exacerbation if she has COPD.  - Discussed with Dr. Kem Kays in detail. - Decided to treat her with azithromycin 5 day course and prednisone 40 mg daily for 5 days. -No need for imaging. - Continue antihistamines and nasal steroid. - Will leave the decision of starting further specific therapy for COPD/asthma Dr. Donnetta Hutching discretion as we do not have the PFTs results today with [place]. - She has an appointment with Dr. Criselda Peaches on 06/17/2012.

## 2012-06-05 NOTE — Progress Notes (Signed)
Case discussed with Dr. Patel immediately after the resident saw the patient.  We reviewed the resident's history and exam and pertinent patient test results.  I agree with the assessment, diagnosis and plan of care documented in the resident's note. 

## 2012-06-07 ENCOUNTER — Other Ambulatory Visit: Payer: Self-pay | Admitting: *Deleted

## 2012-06-07 DIAGNOSIS — J069 Acute upper respiratory infection, unspecified: Secondary | ICD-10-CM

## 2012-06-07 MED ORDER — GUAIFENESIN-CODEINE 100-10 MG/5ML PO SYRP: 5.0000 mL | ORAL_SOLUTION | Freq: Three times a day (TID) | ORAL | Status: DC | PRN

## 2012-06-07 NOTE — Telephone Encounter (Signed)
Called to pharm 

## 2012-06-17 ENCOUNTER — Ambulatory Visit (INDEPENDENT_AMBULATORY_CARE_PROVIDER_SITE_OTHER): Payer: Medicare Other | Admitting: Internal Medicine

## 2012-06-17 ENCOUNTER — Encounter: Payer: Self-pay | Admitting: Internal Medicine

## 2012-06-17 VITALS — BP 116/76 | HR 74 | Temp 97.6°F | Ht 60.0 in | Wt 203.1 lb

## 2012-06-17 DIAGNOSIS — E785 Hyperlipidemia, unspecified: Secondary | ICD-10-CM

## 2012-06-17 DIAGNOSIS — J069 Acute upper respiratory infection, unspecified: Secondary | ICD-10-CM

## 2012-06-17 DIAGNOSIS — Z0000 Encounter for general adult medical examination without abnormal findings: Secondary | ICD-10-CM

## 2012-06-17 DIAGNOSIS — I10 Essential (primary) hypertension: Secondary | ICD-10-CM

## 2012-06-17 DIAGNOSIS — E119 Type 2 diabetes mellitus without complications: Secondary | ICD-10-CM

## 2012-06-17 DIAGNOSIS — R609 Edema, unspecified: Secondary | ICD-10-CM

## 2012-06-17 DIAGNOSIS — R600 Localized edema: Secondary | ICD-10-CM

## 2012-06-17 DIAGNOSIS — J309 Allergic rhinitis, unspecified: Secondary | ICD-10-CM

## 2012-06-17 LAB — CBC WITH DIFFERENTIAL/PLATELET
Basophils Absolute: 0 10*3/uL (ref 0.0–0.1)
Basophils Relative: 1 % (ref 0–1)
Eosinophils Absolute: 0.4 10*3/uL (ref 0.0–0.7)
Eosinophils Relative: 5 % (ref 0–5)
HCT: 39.9 % (ref 36.0–46.0)
Hemoglobin: 13.2 g/dL (ref 12.0–15.0)
Lymphocytes Relative: 13 % (ref 12–46)
Lymphs Abs: 1 10*3/uL (ref 0.7–4.0)
MCH: 27.7 pg (ref 26.0–34.0)
MCHC: 33.1 g/dL (ref 30.0–36.0)
MCV: 83.8 fL (ref 78.0–100.0)
Monocytes Absolute: 0.6 10*3/uL (ref 0.1–1.0)
Monocytes Relative: 8 % (ref 3–12)
Neutro Abs: 5.6 10*3/uL (ref 1.7–7.7)
Neutrophils Relative %: 73 % (ref 43–77)
Platelets: 283 10*3/uL (ref 150–400)
RBC: 4.76 MIL/uL (ref 3.87–5.11)
RDW: 13.8 % (ref 11.5–15.5)
WBC: 7.6 10*3/uL (ref 4.0–10.5)

## 2012-06-17 LAB — BASIC METABOLIC PANEL WITH GFR
BUN: 15 mg/dL (ref 6–23)
CO2: 28 mEq/L (ref 19–32)
Calcium: 9.4 mg/dL (ref 8.4–10.5)
Chloride: 100 mEq/L (ref 96–112)
Creat: 0.9 mg/dL (ref 0.50–1.10)
GFR, Est African American: 78 mL/min
GFR, Est Non African American: 68 mL/min
Glucose, Bld: 149 mg/dL — ABNORMAL HIGH (ref 70–99)
Potassium: 4 mEq/L (ref 3.5–5.3)
Sodium: 138 mEq/L (ref 135–145)

## 2012-06-17 LAB — GLUCOSE, CAPILLARY: Glucose-Capillary: 146 mg/dL — ABNORMAL HIGH (ref 70–99)

## 2012-06-17 LAB — POCT GLYCOSYLATED HEMOGLOBIN (HGB A1C): Hemoglobin A1C: 7.2

## 2012-06-17 NOTE — Patient Instructions (Addendum)
General Instructions: Please schedule a follow up visit within the next 6 weeks, please call to get an appointment with Dr. Criselda Peaches.   For your medications:   Please bring all of your pill  Bottles with you to each visit.  This will help make sure that we have an up to date list of all the medications you are taking.  Please also bring any over the counter herbal medications you are taking (not including advil, tylenol, etc.)  Please continue taking your medications as prescribed.  Please stop taking prednisone  Your leg swelling could be explained by the recent use of steroids or the increased salt in your diet or both.  It could also be a sign of a problem with your kidneys or heart.  We are going to check your kidneys today with urine test and blood tests.  Please decrease the amount of salty, "junk" food in your diet as much as you can.  Also, try to put your feet up when you are resting to allow the swelling to improve on its own.  I will see you back in 6 weeks to check you again for the swelling.  If needed, at that time, we will do an ultrasound of your heart.   Thank you!   Treatment Goals:  Goals (1 Years of Data) as of 06/17/12         As of Today 06/03/12 04/29/12 04/15/12 04/09/12     Blood Pressure    . Blood Pressure < 140/90  116/76 130/80 108/64 126/70 118/70     Diet    . Eat 3 fruit servings each day           Result Component    . HEMOGLOBIN A1C < 7.0  7.2        . LDL CALC < 100    73        Progress Toward Treatment Goals:  Treatment Goal 06/17/2012  Hemoglobin A1C deteriorated  Blood pressure at goal    Self Care Goals & Plans:  Self Care Goal 06/17/2012  Manage my medications take my medicines as prescribed; bring my medications to every visit  Monitor my health check my feet daily  Eat healthy foods -  Be physically active take a walk every day    Home Blood Glucose Monitoring 06/17/2012  Check my blood sugar once a day  When to check my blood sugar  before breakfast     Care Management & Community Referrals:  Referral 06/17/2012  Referrals made for care management support none needed

## 2012-06-17 NOTE — Assessment & Plan Note (Signed)
Unclear cause.  Possibly steroids with combined high salt diet vs. Kidney disease vs. Heart disease.  Will get urine studies today.  Steroids are completed.  Advised resting during the day, putting feet up and assessing whether swelling is better in the morning.  Will see her back in 4-6 weeks.  If swelling persists, and urine studies normal, will pursue TTE.

## 2012-06-17 NOTE — Assessment & Plan Note (Signed)
LDL 73 on statin.  Continue atorvastatin.

## 2012-06-17 NOTE — Assessment & Plan Note (Signed)
BP Readings from Last 3 Encounters:  06/17/12 116/76  06/03/12 130/80  04/29/12 108/64    Lab Results  Component Value Date   NA 141 05/26/2011   K 4.3 05/26/2011   CREATININE 0.87 05/26/2011    Assessment: Blood pressure control: controlled Progress toward BP goal:  at goal Comments: On 2 medications  Plan: Medications:  continue current medications Educational resources provided: brochure Self management tools provided: home blood pressure logbook Other plans: Continue benazapril and HCTZ

## 2012-06-17 NOTE — Assessment & Plan Note (Signed)
Lab Results  Component Value Date   HGBA1C 7.2 06/17/2012   HGBA1C 6.8 03/04/2012   HGBA1C 7.0 09/04/2011     Assessment: Diabetes control: fair control Progress toward A1C goal:  deteriorated Comments: She is diet controlled  Plan: Medications:  diet control Home glucose monitoring: Frequency: once a day Timing: before breakfast Instruction/counseling given: reminded to get eye exam, discussed foot care and discussed diet Educational resources provided: brochure Self management tools provided:   Other plans: She has had a bump in her A1C, unclear if a short course of steroids a week ago could have caused this or more likely related to dietary indiscretions.  Regardless, I asked her to cut back on sweets and high salt foods at this time.  Recheck in 3 months.  If trending up, will need to restart a medication.  LDL is 73.  MAU/Cr, UA today.

## 2012-06-17 NOTE — Assessment & Plan Note (Signed)
Mammogram: Negative Dexa: Osteopenia - on Ca and Vitamin D Flu: UTD Colonoscopy: 2009, several polyps, apparently due for repeat in 10 years Pneumovax: 2012 Tetanus: 2008

## 2012-06-17 NOTE — Assessment & Plan Note (Signed)
Recently changed to claritin.  She notes that her symptoms are better with the claritin and nasonex, but she also believes that they are getting better because it is warmer.

## 2012-06-17 NOTE — Progress Notes (Signed)
  Subjective:    [name], female    DOB: [birth year], 62 y.o.   MRN: [id number]  CC: Follow up for acute issues  HPI  Mariah Henderson is a 62yo woman who presents for follow up of her PFTs and DM.   Mariah Henderson reports that she was recently seen here in the clinic for worsening of her breathing, cough and sputum production.  She was treated with an antibiotic and steroids.  Her cough and SOB have improved, however, she still has a bothersome dry cough which results in a hoarse voice.  Since taking the steroids, however, she has noticed that her hands and feet to her mid calf are swollen, non pitting and non tender.  She has also recently been "cheating" on her diet by eating high salt junk food and candies.  She finished the steroids last week sometime.  She has also had to decrease the amount of exercise she is able to do because of her breathing.  She is not completely sure when the swelling started.  She has not been on a long car trip, no erythema.    Sugars have been ranging from 124 - 200, worse while on the steroids.  A1C bumped up a bit today, likely due to dietary indiscretion.  She checks her sugars daily, but doesn't check it when she "cheats" on her diet.    Decreased exercise b/c of her recent URI, breathing issues.   She is otherwise taking all of her medications as prescribed.  She has no issues with affording her medications or side effects.  They are updated in epic.   She is a non smoker, quit many years ago.    Review of Systems  Constitutional: Positive for activity change. Negative for fever, chills, appetite change and fatigue.  HENT: Positive for trouble swallowing (with reflux). Negative for hearing loss.        Hoarseness with cough  Eyes: Negative for pain and visual disturbance.  Respiratory: Positive for cough. Negative for shortness of breath and wheezing.   Cardiovascular: Positive for leg swelling. Negative for chest pain and palpitations.   Gastrointestinal: Negative for nausea, diarrhea and abdominal distention.  Genitourinary: Negative for dysuria and frequency.  Musculoskeletal: Positive for arthralgias. Negative for joint swelling.  Skin: Negative for color change, pallor and rash.  Neurological: Negative for dizziness, weakness and light-headedness.  Psychiatric/Behavioral: Negative for confusion and decreased concentration.       Objective:   Physical Exam  Constitutional: She is oriented to person, place, and time. She appears well-developed and well-nourished. No distress.  HENT:  Head: Normocephalic and atraumatic.  Mouth/Throat: No oropharyngeal exudate.  Eyes: Conjunctivae are normal. No scleral icterus.  Cardiovascular: Normal rate, regular rhythm and normal heart sounds.   No murmur heard. Pulmonary/Chest: No respiratory distress. She has decreased breath sounds in the left lower field. She has no wheezes.  Abdominal: Soft. Bowel sounds are normal. She exhibits no distension.  Musculoskeletal: She exhibits edema (non pitting, to mid calf, worse at the ankles bilaterally. ). She exhibits no tenderness.  Neurological: She is alert and oriented to person, place, and time.  Skin: Skin is warm and dry. No rash noted. No erythema.  Psychiatric: She has a normal mood and affect. Her behavior is normal.   A1C: 7.2     Assessment & Plan:  RTC in 6 weeks for follow up of LE edema.

## 2012-06-18 LAB — URINALYSIS, ROUTINE W REFLEX MICROSCOPIC
Bilirubin Urine: NEGATIVE
Glucose, UA: NEGATIVE mg/dL
Hgb urine dipstick: NEGATIVE
Ketones, ur: NEGATIVE mg/dL
Leukocytes, UA: NEGATIVE
Nitrite: NEGATIVE
Protein, ur: NEGATIVE mg/dL
Specific Gravity, Urine: 1.02 (ref 1.005–1.030)
Urobilinogen, UA: 0.2 mg/dL (ref 0.0–1.0)
pH: 5.5 (ref 5.0–8.0)

## 2012-06-18 LAB — MICROALBUMIN / CREATININE URINE RATIO
Creatinine, Urine: 146.4 mg/dL
Microalb Creat Ratio: 5 mg/g (ref 0.0–30.0)
Microalb, Ur: 0.73 mg/dL (ref 0.00–1.89)

## 2012-06-28 ENCOUNTER — Other Ambulatory Visit: Payer: Self-pay | Admitting: Internal Medicine

## 2012-07-09 ENCOUNTER — Encounter: Payer: Self-pay | Admitting: Internal Medicine

## 2012-07-09 NOTE — Progress Notes (Signed)
[name], female   DOB: 09/20/1950, 62 y.o.   MRN: [id number]  Addendum  Lab work report:   UA and MAU/Cr wnl  BUN/Cr wnl  Renal disease does not appear to be the culprit for her LE edema.  Appt with me scheduled for July 21, will discuss further work up at that time.

## 2012-07-25 ENCOUNTER — Other Ambulatory Visit: Payer: Self-pay

## 2012-08-05 ENCOUNTER — Ambulatory Visit (HOSPITAL_COMMUNITY)
Admission: RE | Admit: 2012-08-05 | Discharge: 2012-08-05 | Disposition: A | Discharge status: Home / Self Care | Payer: Medicare Other | Source: Ambulatory Visit | Attending: Internal Medicine | Admitting: Internal Medicine

## 2012-08-05 ENCOUNTER — Encounter: Payer: Self-pay | Admitting: Internal Medicine

## 2012-08-05 ENCOUNTER — Ambulatory Visit (INDEPENDENT_AMBULATORY_CARE_PROVIDER_SITE_OTHER): Payer: Medicare Other | Admitting: Internal Medicine

## 2012-08-05 VITALS — BP 108/71 | HR 79 | Temp 98.1°F | Ht 60.0 in | Wt 197.4 lb

## 2012-08-05 DIAGNOSIS — R600 Localized edema: Secondary | ICD-10-CM

## 2012-08-05 DIAGNOSIS — R609 Edema, unspecified: Secondary | ICD-10-CM

## 2012-08-05 DIAGNOSIS — M20001 Unspecified deformity of right finger(s): Secondary | ICD-10-CM

## 2012-08-05 DIAGNOSIS — M20009 Unspecified deformity of unspecified finger(s): Secondary | ICD-10-CM

## 2012-08-05 DIAGNOSIS — M1990 Unspecified osteoarthritis, unspecified site: Secondary | ICD-10-CM

## 2012-08-05 DIAGNOSIS — E119 Type 2 diabetes mellitus without complications: Secondary | ICD-10-CM

## 2012-08-05 DIAGNOSIS — L988 Other specified disorders of the skin and subcutaneous tissue: Secondary | ICD-10-CM

## 2012-08-05 LAB — GLUCOSE, CAPILLARY: Glucose-Capillary: 135 mg/dL — ABNORMAL HIGH (ref 70–99)

## 2012-08-05 MED ORDER — TRAMADOL HCL 50 MG PO TABS: 50.0000 mg | ORAL_TABLET | Freq: Four times a day (QID) | ORAL | Status: DC | PRN

## 2012-08-05 NOTE — Assessment & Plan Note (Signed)
Unclear etiology, does not appear to be malignant, ? Reaction to an allergen.   Refer to dermatology for possible biopsy.

## 2012-08-05 NOTE — Patient Instructions (Addendum)
Please schedule a follow up visit within the next 2 months for your diabetes.   For your medications:   Please bring all of your pill  Bottles with you to each visit.  This will help make sure that we have an up to date list of all the medications you are taking.  Please also bring any over the counter herbal medications you are taking (not including advil, tylenol, etc.)  Please continue taking your medications as prescribed  Your tramadol has been refilled.  Please go get your xray of your hand for the new area on your thumb.  You will be referred to dermatology.    Your ankle swelling is probably from the veins in your legs not pumping blood well enough (venous insufficiency).  This has improved with your exercising and diet.  Congratulations on losing 6 pounds!  Keep up the good work!  Thank you!

## 2012-08-05 NOTE — Progress Notes (Signed)
Subjective:    [name], female    DOB: 1950-04-28, 62 y.o.   MRN: [id number]  CC: Follow up of LE swelling.   HPI  [name] is a 62yo woman with PMH of DM type 2, HTN, HLD who presents for 6 week follow up of LE swelling.  She reports that her symptoms are better.  They last all day and are worse when she wears certain shoes.  She is not certain if the swelling is worse with increased walking.  She has cut back on salt and has started exercising by walking 5 days per week and the swelling has improved. She has no cardiac symptoms including chest pain, palpitations, orthopnea, PND.  She does have some reactive airway disease which has improved as well.    Other issues today is a new darkened area on her left lateral thigh that is rough and itchy.  It has been present for a month or two.  She does not remember a bit to the area.  She has no new detergent/soaps.    She further notes a new hard spot on her right thenar eminence which is tender to palaption but not mobile.    Her medications were reviewed and updated in epic.   Current Outpatient Prescriptions on File Prior to Visit  Medication Sig Dispense Refill  . acetaminophen (TYLENOL) 500 MG tablet Take 1 tablet (500 mg total) by mouth every 4 (four) hours as needed for pain. Do not take more than 6 times per day  40 tablet  2  . albuterol (PROVENTIL HFA;VENTOLIN HFA) 108 (90 BASE) MCG/ACT inhaler Inhale 2 puffs into the lungs every 6 (six) hours as needed for wheezing.  1 Inhaler  6  . albuterol (PROVENTIL) (2.5 MG/3ML) 0.083% nebulizer solution Take 3 mLs (2.5 mg total) by nebulization every 6 (six) hours as needed for wheezing.  75 mL  12  . aspirin (ASPIR-81) 81 MG EC tablet Take 81 mg by mouth daily.        Marland Kitchen atorvastatin (LIPITOR) 40 MG tablet Take 1 tablet (40 mg total) by mouth daily.  90 tablet  3  . benazepril (LOTENSIN) 20 MG tablet Take 1 tablet (20 mg total) by mouth daily.  30 tablet  2  . Blood Glucose  Monitoring Suppl (ONE TOUCH ULTRA MINI) W/DEVICE KIT Use to check blood sugars once daily. Dx code: 250.00.  1 each  0  . Blood Glucose Monitoring Suppl (PRODIGY BLOOD GLUCOSE MONITOR) DEVI Use as instructed       . calcium carbonate (OS-CAL) 600 MG TABS Take 600 mg by mouth 2 (two) times daily.        . Cholecalciferol (VITAMIN D3) 1000 UNITS CAPS Take 2 tablets by mouth daily.        . divalproex (DEPAKOTE ER) 250 MG 24 hr tablet Take 4 tablets (1,000 mg total) by mouth at bedtime.  30 tablet  2  . gabapentin (NEURONTIN) 300 MG capsule Take 300 mg by mouth at bedtime.        Marland Kitchen glucose blood test strip Use to check blood sugars once daily.  Dx code: 250.00.  50 each  6  . hydrochlorothiazide (HYDRODIURIL) 25 MG tablet Take 1 tablet (25 mg total) by mouth daily.  30 tablet  2  . ipratropium (ATROVENT) 0.02 % nebulizer solution Take 2.5 mLs (0.5 mg total) by nebulization every 4 (four) hours as needed for wheezing.  75 mL  2  . latanoprost (  XALATAN) 0.005 % ophthalmic solution       . loratadine (CLARITIN) 10 MG tablet Take 1 tablet (10 mg total) by mouth daily.  30 tablet  1  . meclizine (ANTIVERT) 12.5 MG tablet Take 12.5 mg by mouth 3 (three) times daily as needed.       . mometasone (NASONEX) 50 MCG/ACT nasal spray 2 sprays by Nasal route daily.  17 g  3  . omeprazole (PRILOSEC) 20 MG capsule take 2 capsules by mouth once daily  60 capsule  5  . ONE TOUCH LANCETS MISC Use to check blood sugars once daily. Dx code: 250.00.  200 each  2  . PARoxetine (PAXIL) 20 MG tablet Take 20 mg by mouth every morning.        . zolpidem (AMBIEN) 10 MG tablet Take 10 mg by mouth at bedtime as needed.         Review of Systems  Constitutional: Positive for appetite change (improved exercise, 5 times per week). Negative for fever and chills.  HENT: Negative for hearing loss and ear pain.   Eyes: Negative for pain and visual disturbance.  Respiratory: Positive for wheezing (occasional). Negative for cough and  shortness of breath.   Cardiovascular: Positive for leg swelling (to ankles, non pitting). Negative for chest pain and palpitations.  Gastrointestinal: Negative for abdominal pain, blood in stool and anal bleeding.  Genitourinary: Negative for dysuria and difficulty urinating.  Musculoskeletal: Positive for arthralgias. Negative for back pain and gait problem.  Skin: Negative for pallor and rash.  Neurological: Negative for syncope and light-headedness.       Objective:   Physical Exam  Constitutional: She is oriented to person, place, and time. She appears well-developed and well-nourished. No distress.  HENT:  Head: Normocephalic and atraumatic.  Eyes: Conjunctivae are normal. No scleral icterus.  Cardiovascular: Normal rate, regular rhythm and normal heart sounds.   Pulmonary/Chest: Effort normal and breath sounds normal. No respiratory distress. She has no wheezes.  Abdominal: Soft. Bowel sounds are normal. She exhibits no distension.  Musculoskeletal: She exhibits edema (non pitting to ankles bilaterally, non tender). She exhibits no tenderness.       Arms: Neurological: She is alert and oriented to person, place, and time.  Skin: Skin is warm and dry. No erythema.  Psychiatric: She has a normal mood and affect. Her behavior is normal.   No labs needed     Assessment & Plan:  RTC in 2 months for DM follow up.

## 2012-08-05 NOTE — Assessment & Plan Note (Signed)
Along with new bony prominence, will get a repeat hand xray on the right.

## 2012-08-05 NOTE — Assessment & Plan Note (Signed)
Improved with improved diet and exercise.  I do not feel that she needs a TTE at this moment.  I will continue to follow along with her.  This is likely venous insufficiency.

## 2012-08-06 ENCOUNTER — Telehealth: Payer: Self-pay | Admitting: *Deleted

## 2012-08-06 NOTE — Telephone Encounter (Signed)
Call from pt - stated she forgot to ask you if she needs to take any other vitamins beside Vit D and Calcium? Thanks

## 2012-08-09 ENCOUNTER — Encounter: Payer: Self-pay | Admitting: Internal Medicine

## 2012-08-19 NOTE — Telephone Encounter (Signed)
Called and discussed vitamins with the patient.  Advised a daily MVI.

## 2012-08-22 ENCOUNTER — Telehealth: Payer: Self-pay | Admitting: *Deleted

## 2012-08-22 ENCOUNTER — Other Ambulatory Visit: Payer: Self-pay | Admitting: Internal Medicine

## 2012-08-22 DIAGNOSIS — J069 Acute upper respiratory infection, unspecified: Secondary | ICD-10-CM

## 2012-08-22 MED ORDER — FLUTICASONE-SALMETEROL 100-50 MCG/DOSE IN AEPB: 1.0000 | INHALATION_SPRAY | Freq: Two times a day (BID) | RESPIRATORY_TRACT | Status: DC

## 2012-08-22 MED ORDER — GUAIFENESIN-CODEINE 100-10 MG/5ML PO SYRP: 5.0000 mL | ORAL_SOLUTION | Freq: Three times a day (TID) | ORAL | Status: AC | PRN

## 2012-08-22 NOTE — Telephone Encounter (Signed)
Pt called with c/o cough, non productive.  She can't get any mucus up but pt feels it's in her chest.  Some SOB noted. She denies fever.   She states this has happened in past and Dr. Stann Mainland give her meds to clear it up.  I told her she would need to be seen to get prednisone or antibiotics and she states she was just in ( 7/21 ) and you are aware of her problems. Pt # [phone]

## 2012-08-22 NOTE — Telephone Encounter (Signed)
Rx called in 

## 2012-08-22 NOTE — Telephone Encounter (Signed)
Called and spoke with Ms. Romer.  Identity confirmed by birth date.    She is having some mild congestion and a dry cough nonproductive.  She notes that it is very similar to previous episodes and she is interested in a cough medicine.  She also reports that she is not on nasonex, but flonase and would prefer that medication.    I discussed with her the option of doing a trial of Advair to help with her recurrent symptoms.  She has PFTs which show a mixed reactive/obstructive disease and I wonder if Advair may be beneficial for her.  I will send in a month supply.  I advised her that she should take it every day.   I asked her to call back if her symptoms worsen.   Signed Debe Coder, MD

## 2012-09-02 ENCOUNTER — Ambulatory Visit: Payer: Medicare Other | Admitting: Internal Medicine

## 2012-09-04 ENCOUNTER — Encounter: Payer: Self-pay | Admitting: Internal Medicine

## 2012-09-04 ENCOUNTER — Ambulatory Visit (HOSPITAL_COMMUNITY)
Admission: RE | Admit: 2012-09-04 | Discharge: 2012-09-04 | Disposition: A | Discharge status: Home / Self Care | Payer: Medicare Other | Source: Ambulatory Visit | Attending: Internal Medicine | Admitting: Internal Medicine

## 2012-09-04 ENCOUNTER — Ambulatory Visit (INDEPENDENT_AMBULATORY_CARE_PROVIDER_SITE_OTHER): Payer: Medicare Other | Admitting: Internal Medicine

## 2012-09-04 VITALS — BP 120/72 | HR 76 | Temp 98.4°F | Ht 60.0 in | Wt 195.8 lb

## 2012-09-04 DIAGNOSIS — M79609 Pain in unspecified limb: Secondary | ICD-10-CM

## 2012-09-04 DIAGNOSIS — M79604 Pain in right leg: Secondary | ICD-10-CM | POA: Insufficient documentation

## 2012-09-04 DIAGNOSIS — M79671 Pain in right foot: Secondary | ICD-10-CM

## 2012-09-04 DIAGNOSIS — R011 Cardiac murmur, unspecified: Secondary | ICD-10-CM

## 2012-09-04 DIAGNOSIS — E119 Type 2 diabetes mellitus without complications: Secondary | ICD-10-CM

## 2012-09-04 LAB — GLUCOSE, CAPILLARY: Glucose-Capillary: 136 mg/dL — ABNORMAL HIGH (ref 70–99)

## 2012-09-04 NOTE — Patient Instructions (Addendum)
Please keep your appointment with Dr. Criselda Peaches on 10/21/12  1. We will order an ultrasound of your heart to evaluate the murmur 2. We will get an xray of your right foot 3. Take tylenol 1 tablet (500 mg) as needed for pain -do not take more than 6 times per day; also you may take tramadol 4. You may try alternating ice/heat for the pain; use whichever makes you feel better

## 2012-09-04 NOTE — Assessment & Plan Note (Signed)
Pt was bitten by a spider on her right foot about 3 weeks ago.  She c/o of right foot pain for the past couple of weeks that is present all of the time whether she is putting pressure on it or sitting, but is worse when she puts pressure on it.  She describes the pain as a dull and nonradiating.  The pain is not relieved with tylenol or tramadol.  She denies any fever/chills, N/V, or dizziness.  She denies any other trauma to the foot.  On physical exam, pt has nl ROM, 2+DP pulses b/l, tenderness to palpation primarily on the dorsal aspect.  Extensor tendonitis is a possibility given pt pain is located on the dorsum and is worse with walking. OA is also a possibility given that pt has a h/o DJD.   Metatarsal stress fracture is unlikely given that pt does not have point tenderness and there is no swelling.  Plantar fasciitis is unlikely because the pt is not having heel pain and would typically get better after walking.  Claudication is unlikely-pt does not complain of leg pain.    -XR of right foot -500 mg tylenol po every 4 hours as needed for pain -rest/elevation/ice/heat as needed  -ACE bandage  -keep f/u appt with Dr. Criselda Peaches in October

## 2012-09-04 NOTE — Progress Notes (Signed)
[name], female   DOB: 09/14/1950, 62 y.o.   MRN: [id number]    Subjective:   [name] female   DOB: [birth year] 62 y.o.   MRN: [id number]  HPI: [name] is a 62 y.o. here for an acute visit due to right foot pain.  She has a PMH outlined below.  She is a pt of Dr. Criselda Peaches.  For details please see problem based charting assessment and plan below.     Past Medical History  Diagnosis Date  . Diabetes mellitus   . Hypertension   . Hyperlipidemia   . Low back pain   . Cervical spondylosis   . Obesity   . Depression     followed by Dr. Allyne Gee; no longer of depakote, seroquel  . Chronic serous otitis media   . Foot drop   . Constipation   . Paraumbilical hernia    Current Outpatient Prescriptions  Medication Sig Dispense Refill  . acetaminophen (TYLENOL) 500 MG tablet Take 1 tablet (500 mg total) by mouth every 4 (four) hours as needed for pain. Do not take more than 6 times per day  40 tablet  2  . albuterol (PROVENTIL HFA;VENTOLIN HFA) 108 (90 BASE) MCG/ACT inhaler Inhale 2 puffs into the lungs every 6 (six) hours as needed for wheezing.  1 Inhaler  6  . albuterol (PROVENTIL) (2.5 MG/3ML) 0.083% nebulizer solution Take 3 mLs (2.5 mg total) by nebulization every 6 (six) hours as needed for wheezing.  75 mL  12  . aspirin (ASPIR-81) 81 MG EC tablet Take 81 mg by mouth daily.        Marland Kitchen atorvastatin (LIPITOR) 40 MG tablet Take 1 tablet (40 mg total) by mouth daily.  90 tablet  3  . benazepril (LOTENSIN) 20 MG tablet Take 1 tablet (20 mg total) by mouth daily.  30 tablet  2  . Blood Glucose Monitoring Suppl (ONE TOUCH ULTRA MINI) W/DEVICE KIT Use to check blood sugars once daily. Dx code: 250.00.  1 each  0  . Blood Glucose Monitoring Suppl (PRODIGY BLOOD GLUCOSE MONITOR) DEVI Use as instructed       . calcium carbonate (OS-CAL) 600 MG TABS Take 600 mg by mouth 2 (two) times daily.        . Cholecalciferol (VITAMIN D3) 1000 UNITS CAPS Take 2  tablets by mouth daily.        . divalproex (DEPAKOTE ER) 250 MG 24 hr tablet Take 4 tablets (1,000 mg total) by mouth at bedtime.  30 tablet  2  . fluticasone (FLONASE) 50 MCG/ACT nasal spray instill 1 spray into each nostril once daily  16 g  2  . Fluticasone-Salmeterol (ADVAIR DISKUS) 100-50 MCG/DOSE AEPB Inhale 1 puff into the lungs 2 (two) times daily.  60 each  0  . gabapentin (NEURONTIN) 300 MG capsule Take 300 mg by mouth at bedtime.        Marland Kitchen glucose blood test strip Use to check blood sugars once daily.  Dx code: 250.00.  50 each  6  . hydrochlorothiazide (HYDRODIURIL) 25 MG tablet Take 1 tablet (25 mg total) by mouth daily.  30 tablet  2  . ipratropium (ATROVENT) 0.02 % nebulizer solution Take 2.5 mLs (0.5 mg total) by nebulization every 4 (four) hours as needed for wheezing.  75 mL  2  . latanoprost (XALATAN) 0.005 % ophthalmic solution       . loratadine (CLARITIN) 10 MG tablet Take 1 tablet (10  mg total) by mouth daily.  30 tablet  1  . meclizine (ANTIVERT) 12.5 MG tablet Take 12.5 mg by mouth 3 (three) times daily as needed.       Marland Kitchen omeprazole (PRILOSEC) 20 MG capsule take 2 capsules by mouth once daily  60 capsule  5  . ONE TOUCH LANCETS MISC Use to check blood sugars once daily. Dx code: 250.00.  200 each  2  . PARoxetine (PAXIL) 20 MG tablet Take 20 mg by mouth every morning.        . traMADol (ULTRAM) 50 MG tablet Take 1 tablet (50 mg total) by mouth every 6 (six) hours as needed for pain.  60 tablet  3  . zolpidem (AMBIEN) 10 MG tablet Take 10 mg by mouth at bedtime as needed.       No current facility-administered medications for this visit.   No family history on file. History   Social History  . Marital Status: Divorced    Spouse Name: N/A    Number of Children: N/A  . Years of Education: N/A   Social History Main Topics  . Smoking status: Former Smoker    Types: Cigarettes    Quit date: 01/17/1996  . Smokeless tobacco: None  . Alcohol Use: No  . Drug Use: No    . Sexual Activity: None   Other Topics Concern  . None   Social History Narrative  . None   Review of Systems:  Review of Systems  Constitutional: Negative for fever and chills.  Respiratory: Negative for cough.   Cardiovascular: Positive for leg swelling. Negative for chest pain, palpitations and claudication.  Gastrointestinal: Positive for nausea. Negative for vomiting.  Musculoskeletal: Positive for joint pain.  Neurological: Negative for weakness.    Objective:   Physical Exam  Constitutional: She is oriented to person, place, and time and well-developed, well-nourished, and in no distress.  HENT:  Head: Normocephalic and atraumatic.  Eyes: Conjunctivae and EOM are normal. Pupils are equal, round, and reactive to light.  Neck: Neck supple.  Cardiovascular: Normal rate, regular rhythm, normal heart sounds and intact distal pulses.   Pulmonary/Chest: Effort normal and breath sounds normal. No respiratory distress.  Abdominal: Soft. Bowel sounds are normal. There is no tenderness.  Musculoskeletal:       Right foot: She exhibits tenderness (primarily on the dorsal surface). She exhibits normal range of motion, no bony tenderness, no swelling, normal capillary refill, no crepitus, no deformity and no laceration.  Neurological: She is alert and oriented to person, place, and time.  Skin: Skin is warm and dry. No rash noted.    Filed Vitals:   09/04/12 0854  BP: 120/72  Pulse: 76  Temp: 98.4 F (36.9 C)  TempSrc: Oral  Height: 5' (1.524 m)  Weight: 195 lb 12.8 oz (88.814 kg)  SpO2: 95%    Assessment & Plan:

## 2012-09-04 NOTE — Assessment & Plan Note (Addendum)
Pt nurse heard a murmur on exam and requested it be evaluated.  On physical exam, I did not appreciate a murmur.   -echocardiogram

## 2012-09-05 ENCOUNTER — Other Ambulatory Visit: Payer: Self-pay | Admitting: Otolaryngology

## 2012-09-05 DIAGNOSIS — R1310 Dysphagia, unspecified: Secondary | ICD-10-CM

## 2012-09-05 DIAGNOSIS — K219 Gastro-esophageal reflux disease without esophagitis: Secondary | ICD-10-CM

## 2012-09-09 NOTE — Progress Notes (Signed)
I saw and evaluated the patient.  I personally confirmed the key portions of the history and exam documented by Dr. Delane Ginger and I reviewed pertinent patient test results.  The assessment, diagnosis, and plan were formulated together and I agree with the documentation in the resident's note.  Will get xray to rule out stress fracture.

## 2012-09-11 ENCOUNTER — Other Ambulatory Visit: Payer: Medicare Other

## 2012-09-18 ENCOUNTER — Ambulatory Visit (HOSPITAL_COMMUNITY)
Admission: RE | Admit: 2012-09-18 | Discharge: 2012-09-18 | Disposition: A | Discharge status: Home / Self Care | Payer: Medicare Other | Source: Ambulatory Visit | Attending: Internal Medicine | Admitting: Internal Medicine

## 2012-09-18 DIAGNOSIS — E119 Type 2 diabetes mellitus without complications: Secondary | ICD-10-CM | POA: Insufficient documentation

## 2012-09-18 DIAGNOSIS — I10 Essential (primary) hypertension: Secondary | ICD-10-CM | POA: Insufficient documentation

## 2012-09-18 DIAGNOSIS — R011 Cardiac murmur, unspecified: Secondary | ICD-10-CM | POA: Insufficient documentation

## 2012-09-18 NOTE — Progress Notes (Signed)
  Echocardiogram 2D Echocardiogram has been performed.  Mariah Henderson 09/18/2012, 2:48 PM

## 2012-09-23 ENCOUNTER — Telehealth: Payer: Self-pay | Admitting: *Deleted

## 2012-09-23 NOTE — Telephone Encounter (Signed)
Message left per pt - requesting results of x-rays also stated blood sugar this morning was 213. She can be reached at this # [phone] today.

## 2012-09-25 ENCOUNTER — Other Ambulatory Visit: Payer: Self-pay | Admitting: Internal Medicine

## 2012-09-25 ENCOUNTER — Telehealth: Payer: Self-pay | Admitting: Internal Medicine

## 2012-09-25 NOTE — Telephone Encounter (Signed)
Called and spoke to Mariah Henderson regarding her Xray results and Echo results.  We discussed the findings.  We also discussed her blood sugars, and she only had the one incident of the high sugar.    Otherwise she is doing well and will call back with any further needs.   Signed Debe Coder, MD

## 2012-09-25 NOTE — Telephone Encounter (Signed)
See Dr Donnetta Hutching telephone note.

## 2012-09-29 ENCOUNTER — Other Ambulatory Visit: Payer: Self-pay | Admitting: Internal Medicine

## 2012-10-19 ENCOUNTER — Other Ambulatory Visit: Payer: Self-pay | Admitting: Internal Medicine

## 2012-10-21 ENCOUNTER — Encounter: Payer: Self-pay | Admitting: Internal Medicine

## 2012-10-21 ENCOUNTER — Ambulatory Visit (INDEPENDENT_AMBULATORY_CARE_PROVIDER_SITE_OTHER): Payer: Medicare Other | Admitting: Internal Medicine

## 2012-10-21 VITALS — BP 125/80 | HR 70 | Temp 97.9°F | Ht 60.0 in | Wt 198.9 lb

## 2012-10-21 DIAGNOSIS — Z0000 Encounter for general adult medical examination without abnormal findings: Secondary | ICD-10-CM

## 2012-10-21 DIAGNOSIS — E785 Hyperlipidemia, unspecified: Secondary | ICD-10-CM

## 2012-10-21 DIAGNOSIS — I10 Essential (primary) hypertension: Secondary | ICD-10-CM

## 2012-10-21 DIAGNOSIS — Z23 Encounter for immunization: Secondary | ICD-10-CM

## 2012-10-21 DIAGNOSIS — J309 Allergic rhinitis, unspecified: Secondary | ICD-10-CM

## 2012-10-21 DIAGNOSIS — M25519 Pain in unspecified shoulder: Secondary | ICD-10-CM

## 2012-10-21 DIAGNOSIS — R609 Edema, unspecified: Secondary | ICD-10-CM

## 2012-10-21 DIAGNOSIS — L989 Disorder of the skin and subcutaneous tissue, unspecified: Secondary | ICD-10-CM

## 2012-10-21 DIAGNOSIS — E119 Type 2 diabetes mellitus without complications: Secondary | ICD-10-CM

## 2012-10-21 DIAGNOSIS — R600 Localized edema: Secondary | ICD-10-CM

## 2012-10-21 DIAGNOSIS — M25511 Pain in right shoulder: Secondary | ICD-10-CM

## 2012-10-21 DIAGNOSIS — J302 Other seasonal allergic rhinitis: Secondary | ICD-10-CM

## 2012-10-21 LAB — POCT GLYCOSYLATED HEMOGLOBIN (HGB A1C): Hemoglobin A1C: 6.8

## 2012-10-21 LAB — GLUCOSE, CAPILLARY: Glucose-Capillary: 121 mg/dL — ABNORMAL HIGH (ref 70–99)

## 2012-10-21 MED ORDER — ALBUTEROL SULFATE HFA 108 (90 BASE) MCG/ACT IN AERS: 2.0000 | INHALATION_SPRAY | Freq: Four times a day (QID) | RESPIRATORY_TRACT | Status: DC | PRN

## 2012-10-21 MED ORDER — LORATADINE 10 MG PO TABS: 10.0000 mg | ORAL_TABLET | Freq: Every day | ORAL | Status: DC

## 2012-10-21 MED ORDER — FLUTICASONE-SALMETEROL 100-50 MCG/DOSE IN AEPB: 1.0000 | INHALATION_SPRAY | Freq: Two times a day (BID) | RESPIRATORY_TRACT | Status: DC

## 2012-10-21 NOTE — Assessment & Plan Note (Signed)
Last LDL checked in April of this year was 64, continue atorvastatin.

## 2012-10-21 NOTE — Assessment & Plan Note (Signed)
Sports Medicine Referral today.

## 2012-10-21 NOTE — Assessment & Plan Note (Signed)
BP Readings from Last 3 Encounters:  10/21/12 125/80  09/04/12 120/72  08/05/12 108/71    Lab Results  Component Value Date   NA 138 06/17/2012   K 4.0 06/17/2012   CREATININE 0.90 06/17/2012    Assessment: Blood pressure control: controlled Progress toward BP goal:  at goal   Plan: Medications:  continue current medications, benazapril, hctz Educational resources provided:   Self management tools provided:   Other plans: renal function stable at last check

## 2012-10-21 NOTE — Progress Notes (Signed)
Subjective:    [name], female    DOB: 05-22-1950, 62 y.o.   MRN: [id number]  CC: Routine follow up for DM  HPI  Mariah Henderson reports that she is doing well. She reports recent onset of shoulder pain, worse with movement, particularly internal rotation of the shoulder.  There is pain over the [facility] joint which she describes as "pulling."  No acute injury.  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  She has very mild morning stiffness that improves almost immediately upon awakening and she sometimes has stiffness during the day.  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    On ROS, she reported that she sometimes has up to 2-4 bowel movements a day, not every day, not watery, loose and normal in appearance.  She reports that this has been happening since taking her vitamin D and calcium.  It is not limiting to her going out, she does not lose control of her bowels.  She further reported LE swelling for which she has been treated symptomatically with lasix.  This is somewhat improved, but not completely.  It worsens with walking or being on her feet for a while.  It is better in the morning and with elevating her feet.  She has had a TTE with normal systolic function this year.   She has a history of DM, which is diet controlled.  A1C today was 6.8.  She further has HTN and HLD for which she is treated from our clinic, both are well controlled today.    She has a history of a skin plaque for which she is due to see Dermatology this month.   She is not smoking.   Review of Systems  Constitutional: Negative for fever, chills and fatigue.  HENT: Negative for hearing loss and dental problem.   Eyes: Negative for pain and visual disturbance.  Respiratory: Positive for shortness of breath. Negative for cough and wheezing.   Cardiovascular: Positive for leg swelling. Negative for chest pain.  Gastrointestinal: Positive for  diarrhea (loose stools). Negative for nausea, vomiting, abdominal pain and constipation.  Genitourinary: Negative for dysuria, frequency and difficulty urinating.  Musculoskeletal: Positive for arthralgias (right shoulder and hand). Negative for back pain, joint swelling and gait problem.  Skin: Negative for rash and wound.  Neurological: Positive for dizziness (h/o vertigo). Negative for weakness and light-headedness.  Psychiatric/Behavioral: Negative for confusion and decreased concentration.       Objective:   Physical Exam  Constitutional: She is oriented to person, place, and time. She appears well-developed and well-nourished. No distress.  HENT:  Head: Normocephalic and atraumatic.  Eyes: Pupils are equal, round, and reactive to light. No scleral icterus.  Neck: Neck supple.  Cardiovascular: Normal rate, regular rhythm and normal heart sounds.   No murmur heard. Pulmonary/Chest: Effort normal and breath sounds normal. No respiratory distress. She has no wheezes.  Abdominal: Soft. Bowel sounds are normal.  Musculoskeletal: She exhibits edema and tenderness.  + decreased ROM on the right arm with internal rotation of the shoulder, + pain with IR.  No weakness noted.  + tender to palpation of the right AC joint. Some tenderness to palpation of the MTP of the first finger on the right hand.  No swelling.  1+ pitting edema to bilateral legs to the mid shins.  Continues to have some tenderness to palpation of the right anterior foot.  Otherwise, no joint swelling or pain.   Lymphadenopathy:    She has no cervical adenopathy.  Neurological: She is alert and oriented to person, place, and time.  Skin: Skin is warm and dry. No rash noted. No erythema.  Psychiatric: She has a normal mood and affect. Her behavior is normal.    A1C today 6.8      Assessment & Plan:  RTC in 3-4 months, sooner if needed

## 2012-10-21 NOTE — Assessment & Plan Note (Signed)
Improved. Likely venous insufficiency.  She is taking lasix without issue.

## 2012-10-21 NOTE — Assessment & Plan Note (Signed)
Due to see Dermatology

## 2012-10-21 NOTE — Assessment & Plan Note (Signed)
Mammogram: 03/15/12 - negative Dexa: 03/15/12 - osteopenia Flu: Today Colonoscopy: 2009, several polyps, apparently due for repeat in 10 years Pneumovax: 2012 Tetanus: 2008 No longer gets pap's due to hysterectomy for benign reasons.

## 2012-10-21 NOTE — Assessment & Plan Note (Signed)
Currently diet controlled with A1C today of 6.8.  LDL at 73, BP 125/80.  She has had a recent foot exam, due for an eye exam.  Her renal function is stable.   Lab Results  Component Value Date   HGBA1C 6.8 10/21/2012   HGBA1C 7.2 06/17/2012   HGBA1C 6.8 03/04/2012     Assessment: Diabetes control: good control (HgbA1C at goal) Progress toward A1C goal:  at goal   Plan: Medications:  diet controlled Home glucose monitoring: Frequency:   Timing:   Instruction/counseling given: reminded to get eye exam Educational resources provided:   Self management tools provided: copy of home glucose meter download

## 2012-10-21 NOTE — Patient Instructions (Addendum)
General Instructions: Please schedule a follow up visit within the next 3-4 months.   For your medications:   Please bring all of your pill  Bottles with you to each visit.  This will help make sure that we have an up to date list of all the medications you are taking.  Please also bring any over the counter herbal medications you are taking (not including advil, tylenol, etc.)  Please continue taking all of your medications as prescribed.   You will have an appointment with Sports Medicine for your shoulder.  Please discuss with them your hand pain as well.  Please call if your hand pain gets worse.    Thank you!    Treatment Goals:  Goals (1 Years of Data) as of 10/21/12         As of Today 09/04/12 08/05/12 06/17/12 06/03/12     Blood Pressure    . Blood Pressure < 140/90  125/80 120/72 108/71 116/76 130/80     Diet    . Eat 3 fruit servings each day           Result Component    . HEMOGLOBIN A1C < 7.0  6.8   7.2     . LDL CALC < 100            Progress Toward Treatment Goals:  Treatment Goal 10/21/2012  Hemoglobin A1C at goal  Blood pressure at goal    Self Care Goals & Plans:  Self Care Goal 10/21/2012  Manage my medications take my medicines as prescribed; bring my medications to every visit  Monitor my health bring my glucose meter and log to each visit; keep track of my blood glucose  Eat healthy foods eat smaller portions  Be physically active take a walk every day    Home Blood Glucose Monitoring 06/17/2012  Check my blood sugar once a day  When to check my blood sugar before breakfast     Care Management & Community Referrals:  Referral 10/21/2012  Referrals made for care management support none needed

## 2012-11-04 ENCOUNTER — Ambulatory Visit: Payer: Medicare Other | Admitting: Sports Medicine

## 2012-11-14 ENCOUNTER — Telehealth: Payer: Self-pay | Admitting: Dietician

## 2012-11-14 NOTE — Addendum Note (Signed)
Addended by: Hassan Buckler on: 11/14/2012 11:43 AM   Modules accepted: Orders

## 2012-11-14 NOTE — Telephone Encounter (Signed)
Last appointment at Dr. Laruth Bouchard office was May 2013. He sent her back to  [facility] [place] [address] [place]. She was there this year in September/August. Agreed to give [place] permission to call for resultse

## 2012-11-15 NOTE — Telephone Encounter (Signed)
Called [facility], they are faxing [place] her most recent eye exam report.

## 2012-11-26 NOTE — Addendum Note (Signed)
Addended by: Neomia Dear on: 11/26/2012 05:21 PM   Modules accepted: Orders

## 2012-12-28 ENCOUNTER — Other Ambulatory Visit: Payer: Self-pay | Admitting: Internal Medicine

## 2013-02-17 ENCOUNTER — Ambulatory Visit (INDEPENDENT_AMBULATORY_CARE_PROVIDER_SITE_OTHER): Payer: Medicare HMO | Admitting: Internal Medicine

## 2013-02-17 ENCOUNTER — Other Ambulatory Visit: Payer: Self-pay | Admitting: *Deleted

## 2013-02-17 ENCOUNTER — Encounter: Payer: Self-pay | Admitting: Internal Medicine

## 2013-02-17 VITALS — BP 115/78 | HR 76 | Temp 97.9°F | Wt 195.7 lb

## 2013-02-17 DIAGNOSIS — L988 Other specified disorders of the skin and subcutaneous tissue: Secondary | ICD-10-CM

## 2013-02-17 DIAGNOSIS — R011 Cardiac murmur, unspecified: Secondary | ICD-10-CM

## 2013-02-17 DIAGNOSIS — Z23 Encounter for immunization: Secondary | ICD-10-CM

## 2013-02-17 DIAGNOSIS — E785 Hyperlipidemia, unspecified: Secondary | ICD-10-CM

## 2013-02-17 DIAGNOSIS — E119 Type 2 diabetes mellitus without complications: Secondary | ICD-10-CM

## 2013-02-17 DIAGNOSIS — I10 Essential (primary) hypertension: Secondary | ICD-10-CM

## 2013-02-17 LAB — GLUCOSE, CAPILLARY: Glucose-Capillary: 121 mg/dL — ABNORMAL HIGH (ref 70–99)

## 2013-02-17 LAB — POCT GLYCOSYLATED HEMOGLOBIN (HGB A1C): Hemoglobin A1C: 6.6

## 2013-02-17 MED ORDER — ATORVASTATIN CALCIUM 40 MG PO TABS: 40.0000 mg | ORAL_TABLET | Freq: Every day | ORAL | Status: DC

## 2013-02-17 MED ORDER — TRIAMCINOLONE ACETONIDE 0.1 % EX CREA: 1.0000 "application " | TOPICAL_CREAM | Freq: Two times a day (BID) | CUTANEOUS | Status: DC

## 2013-02-17 NOTE — Assessment & Plan Note (Signed)
Will give a trial of kenalog cream.  H/O eczema in the family.  She is to call if not working.

## 2013-02-17 NOTE — Assessment & Plan Note (Signed)
BP Readings from Last 3 Encounters:  02/17/13 115/78  10/21/12 125/80  09/04/12 120/72    Lab Results  Component Value Date   NA 138 06/17/2012   K 4.0 06/17/2012   CREATININE 0.90 06/17/2012    Assessment: Blood pressure control: controlled Progress toward BP goal:  at goal Comments: She is on benazapril, hctz  Plan: Medications:  continue current medications Educational resources provided: brochure Self management tools provided: home blood pressure logbook Other plans: Renal function stable, check at next visit.

## 2013-02-17 NOTE — Assessment & Plan Note (Signed)
Last LDL done in the 06/2012 - 73, on lipitor, continue

## 2013-02-17 NOTE — Assessment & Plan Note (Signed)
Due to see eye doctor, she will schedule

## 2013-02-17 NOTE — Addendum Note (Signed)
Addended by: Marcelino Duster on: 02/17/2013 11:18 AM   Modules accepted: Orders

## 2013-02-17 NOTE — Assessment & Plan Note (Signed)
TTE normal on 09/2012.

## 2013-02-17 NOTE — Assessment & Plan Note (Signed)
Lab Results  Component Value Date   HGBA1C 6.6 02/17/2013   HGBA1C 6.8 10/21/2012   HGBA1C 7.2 06/17/2012     Assessment: Diabetes control: good control (HgbA1C at goal) Progress toward A1C goal:  at goal Comments: she is diet controlled  Plan: Medications:  diet controlled, continue Home glucose monitoring: Frequency: once a day Timing: before breakfast Instruction/counseling given: reminded to bring medications to each visit and discussed foot care Educational resources provided: brochure Self management tools provided: copy of home glucose meter download Other plans:  LDL is 73, BP well controlled, renal function is good at last check. She is uptodate on eye and foot exam.  No DR.

## 2013-02-17 NOTE — Patient Instructions (Signed)
General Instructions: Please schedule a follow up visit within the next 4 months (June).   For your medications:   Please bring all of your pill  Bottles with you to each visit.  This will help make sure that we have an up to date list of all the medications you are taking.  Please also bring any over the counter herbal medications you are taking (not including advil, tylenol, etc.)  Please continue taking all of your medications as prescribed.  Please start the steroid cream, Kenalog, to the patch on your thigh to see if that helps.  If you have any reaction including redness, pain or worsening of the rash, please stop the cream.  If this cream is too expensive, you can try over the counter cortisone cream.   The artificial sweetener we talked about is called [place].  You can use this or Splenda to sweeten your beverages.    Thank you!    Treatment Goals:  Goals (1 Years of Data) as of 02/17/13         As of Today 10/21/12 09/04/12 08/05/12 06/17/12     Blood Pressure    . Blood Pressure < 140/90  115/78 125/80 120/72 108/71 116/76     Diet    . Eat 3 fruit servings each day           Result Component    . HEMOGLOBIN A1C < 7.0  6.6 6.8   7.2    . LDL CALC < 100            Progress Toward Treatment Goals:  Treatment Goal 02/17/2013  Hemoglobin A1C at goal  Blood pressure at goal    Self Care Goals & Plans:  Self Care Goal 02/17/2013  Manage my medications take my medicines as prescribed; refill my medications on time  Monitor my health keep track of my blood glucose; bring my glucose meter and log to each visit  Eat healthy foods eat foods that are low in salt; eat baked foods instead of fried foods; drink diet soda or water instead of juice or soda  Be physically active take a walk every day    Home Blood Glucose Monitoring 02/17/2013  Check my blood sugar once a day  When to check my blood sugar before breakfast     Care Management & Community Referrals:  Referral  02/17/2013  Referrals made for care management support none needed

## 2013-02-17 NOTE — Progress Notes (Signed)
Subjective:    [name], female    DOB: 04/23/1950, 63 y.o.   MRN: [id number]  CC: Follow up  HPI  Mariah Henderson is a 63yo woman with PMH of HLD, DM2, HTN, allergic rhinitis, DJD, glaucoma who presents for routine follow up. At last visit we discussed some LE swelling which improved with lasix as well as a skin plaque for which she was due to see Dermatology.  She further has a deformity of her left hand ring finger, xrays normal, she deferred seeing a surgeon at that time.    Mariah Henderson reports she was told she needs hearing aids and she is due to pick them up.  She has a plaque like skin lesion on her left thigh which has gotten a little bigger.  She notes a family history of eczema.  She has not been able to afford the copay at the Dermatology office.  Otherwise, she is doing well, she is planning on starting back on her work out routine now that the holidays are over.     She has diet controlled DM 2, I reviewed her BS log which revealed sugars in the 90s-120s.   Gets depakote from [facility]  Due to see the eye doctor for glaucoma  We reviewed her medications.  She is not smoking  Current Outpatient Prescriptions on File Prior to Visit  Medication Sig Dispense Refill  . acetaminophen (TYLENOL) 500 MG tablet Take 1 tablet (500 mg total) by mouth every 4 (four) hours as needed for pain. Do not take more than 6 times per day  40 tablet  2  . albuterol (PROVENTIL HFA;VENTOLIN HFA) 108 (90 BASE) MCG/ACT inhaler Inhale 2 puffs into the lungs every 6 (six) hours as needed for wheezing.  1 Inhaler  6  . albuterol (PROVENTIL) (2.5 MG/3ML) 0.083% nebulizer solution Take 3 mLs (2.5 mg total) by nebulization every 6 (six) hours as needed for wheezing.  75 mL  12  . aspirin (ASPIR-81) 81 MG EC tablet Take 81 mg by mouth daily.        . benazepril (LOTENSIN) 20 MG tablet take 1 tablet by mouth once daily  30 tablet  6  . Blood Glucose Monitoring Suppl  (ONE TOUCH ULTRA MINI) W/DEVICE KIT Use to check blood sugars once daily. Dx code: 250.00.  1 each  0  . Blood Glucose Monitoring Suppl (PRODIGY BLOOD GLUCOSE MONITOR) DEVI Use as instructed       . calcium carbonate (OS-CAL) 600 MG TABS Take 600 mg by mouth 2 (two) times daily.        . Cholecalciferol (VITAMIN D3) 1000 UNITS CAPS Take 2 tablets by mouth daily.        . fluticasone (FLONASE) 50 MCG/ACT nasal spray instill 1 spray into each nostril once daily  16 g  2  . Fluticasone-Salmeterol (ADVAIR DISKUS) 100-50 MCG/DOSE AEPB Inhale 1 puff into the lungs 2 (two) times daily.  60 each  6  . gabapentin (NEURONTIN) 300 MG capsule Take 300 mg by mouth at bedtime.        Marland Kitchen glucose blood test strip Use to check blood sugars once daily.  Dx code: 250.00.  50 each  6  . hydrochlorothiazide (HYDRODIURIL) 25 MG tablet take 1 tablet by mouth once daily  30 tablet  6  . ipratropium (ATROVENT) 0.02 % nebulizer solution Take 2.5 mLs (0.5 mg total) by nebulization every 4 (four) hours as needed for  wheezing.  75 mL  2  . latanoprost (XALATAN) 0.005 % ophthalmic solution       . loratadine (CLARITIN) 10 MG tablet Take 1 tablet (10 mg total) by mouth daily.  30 tablet  1  . meclizine (ANTIVERT) 12.5 MG tablet Take 12.5 mg by mouth 3 (three) times daily as needed.       Marland Kitchen omeprazole (PRILOSEC) 20 MG capsule take 2 capsules by mouth once daily  60 capsule  5  . ONE TOUCH LANCETS MISC Use to check blood sugars once daily. Dx code: 250.00.  200 each  2  . PARoxetine (PAXIL) 20 MG tablet Take 20 mg by mouth every morning.        . traMADol (ULTRAM) 50 MG tablet Take 1 tablet (50 mg total) by mouth every 6 (six) hours as needed for pain.  60 tablet  3  . zolpidem (AMBIEN) 10 MG tablet Take 10 mg by mouth at bedtime as needed.      . divalproex (DEPAKOTE ER) 250 MG 24 hr tablet Take 4 tablets (1,000 mg total) by mouth at bedtime.  30 tablet  2   No current facility-administered medications on file prior to visit.     Review of Systems  Constitutional: Negative for fever and chills.  HENT: Negative for ear discharge and ear pain.   Eyes: Negative for pain and redness.  Respiratory: Negative for cough and shortness of breath.   Cardiovascular: Positive for leg swelling (mild). Negative for chest pain.  Gastrointestinal: Negative for nausea, abdominal pain, diarrhea and constipation.  Genitourinary: Negative for dysuria and difficulty urinating.  Musculoskeletal: Positive for arthralgias (foot pain, going to podiatrist). Negative for back pain.  Skin: Positive for rash (plaque on left thigh). Negative for color change.  Neurological: Positive for dizziness (h/o vertigo) and headaches (occasional, mild). Negative for light-headedness.       Objective:   Physical Exam  Constitutional: She is oriented to person, place, and time. She appears well-developed and well-nourished. No distress.  HENT:  Head: Normocephalic and atraumatic.  Eyes: Right eye exhibits no discharge. Left eye exhibits no discharge. No scleral icterus.  Neck: Neck supple.  Cardiovascular: Normal rate, regular rhythm and normal heart sounds.   Pulmonary/Chest: Effort normal and breath sounds normal. No respiratory distress. She has no wheezes.  Abdominal: Soft. Bowel sounds are normal.  Musculoskeletal: She exhibits edema (1+ edema, improved from previous). She exhibits no tenderness.  Neurological: She is alert and oriented to person, place, and time.  Skin: Skin is warm and dry. Rash (3X3cm and 2X2 cm rough patches of skin on left thigh, no scaling or color change) noted. No erythema.  Psychiatric: She has a normal mood and affect. Her behavior is normal.    A1C today is 6.6, improved from previous.       Assessment & Plan:  RTC in 4 months for routine follow up.

## 2013-04-03 ENCOUNTER — Other Ambulatory Visit: Payer: Self-pay | Admitting: Internal Medicine

## 2013-04-23 ENCOUNTER — Encounter: Payer: Self-pay | Admitting: Dietician

## 2013-06-18 ENCOUNTER — Ambulatory Visit (INDEPENDENT_AMBULATORY_CARE_PROVIDER_SITE_OTHER): Payer: Medicare HMO | Admitting: Internal Medicine

## 2013-06-18 ENCOUNTER — Encounter: Payer: Self-pay | Admitting: Internal Medicine

## 2013-06-18 VITALS — BP 123/74 | HR 72 | Temp 98.0°F | Resp 20 | Ht 61.5 in | Wt 198.6 lb

## 2013-06-18 DIAGNOSIS — J302 Other seasonal allergic rhinitis: Secondary | ICD-10-CM

## 2013-06-18 DIAGNOSIS — M79609 Pain in unspecified limb: Secondary | ICD-10-CM

## 2013-06-18 DIAGNOSIS — E119 Type 2 diabetes mellitus without complications: Secondary | ICD-10-CM

## 2013-06-18 DIAGNOSIS — Z0000 Encounter for general adult medical examination without abnormal findings: Secondary | ICD-10-CM

## 2013-06-18 DIAGNOSIS — E785 Hyperlipidemia, unspecified: Secondary | ICD-10-CM

## 2013-06-18 DIAGNOSIS — I10 Essential (primary) hypertension: Secondary | ICD-10-CM

## 2013-06-18 DIAGNOSIS — J45909 Unspecified asthma, uncomplicated: Secondary | ICD-10-CM

## 2013-06-18 DIAGNOSIS — M79671 Pain in right foot: Secondary | ICD-10-CM

## 2013-06-18 LAB — LIPID PANEL
Cholesterol: 146 mg/dL (ref 0–200)
HDL: 46 mg/dL (ref >39)
LDL Cholesterol: 89 mg/dL (ref 0–99)
Total CHOL/HDL Ratio: 3.2 Ratio
Triglycerides: 56 mg/dL (ref <150)
VLDL: 11 mg/dL (ref 0–40)

## 2013-06-18 LAB — BASIC METABOLIC PANEL WITH GFR
BUN: 13 mg/dL (ref 6–23)
CO2: 32 mEq/L (ref 19–32)
Calcium: 9.7 mg/dL (ref 8.4–10.5)
Chloride: 100 mEq/L (ref 96–112)
Creat: 0.84 mg/dL (ref 0.50–1.10)
GFR, Est African American: 84 mL/min
GFR, Est Non African American: 73 mL/min
Glucose, Bld: 125 mg/dL — ABNORMAL HIGH (ref 70–99)
Potassium: 4.5 mEq/L (ref 3.5–5.3)
Sodium: 140 mEq/L (ref 135–145)

## 2013-06-18 LAB — POCT GLYCOSYLATED HEMOGLOBIN (HGB A1C): Hemoglobin A1C: 6.7

## 2013-06-18 LAB — GLUCOSE, CAPILLARY: Glucose-Capillary: 157 mg/dL — ABNORMAL HIGH (ref 70–99)

## 2013-06-18 NOTE — Patient Instructions (Signed)
General Instructions: Please schedule a follow up visit within the next 4 months.   For your medications:   Please bring all of your pill  Bottles with you to each visit.  This will help make sure that we have an up to date list of all the medications you are taking.  Please also bring any over the counter herbal medications you are taking (not including advil, tylenol, etc.)  Please continue taking all of your medications as prescribed.  Refills will be done today.   Your diabetes and blood pressure are doing great!  You have had a very small increase in your A1C (lab that checks your diabetes).  We discussed how this could be improved with your diet.  Please continue trying to decrease sweet/sugary foods as you can.   You will have blood work today and I will call you with the results.    Thank you!    Treatment Goals:  Goals (1 Years of Data) as of 06/18/13         As of Today 02/17/13 10/21/12 09/04/12 08/05/12     Blood Pressure    . Blood Pressure < 140/90  123/74 115/78 125/80 120/72 108/71     Diet    . Eat 3 fruit servings each day           Result Component    . HEMOGLOBIN A1C < 7.0  6.7 6.6 6.8      . LDL CALC < 100            Progress Toward Treatment Goals:  Treatment Goal 06/18/2013  Hemoglobin A1C at goal  Blood pressure at goal    Self Care Goals & Plans:  Self Care Goal 06/18/2013  Manage my medications take my medicines as prescribed; bring my medications to every visit; refill my medications on time  Monitor my health bring my glucose meter and log to each visit  Eat healthy foods eat smaller portions; eat baked foods instead of fried foods; eat foods that are low in salt; drink diet soda or water instead of juice or soda  Be physically active take a walk every day  Other walking 45 min    Home Blood Glucose Monitoring 06/18/2013  Check my blood sugar once a day  When to check my blood sugar before breakfast     Care Management & Community  Referrals:  Referral 06/18/2013  Referrals made for care management support -  Referrals made to community [facility] nutrition

## 2013-06-18 NOTE — Progress Notes (Signed)
 Subjective:    [name], female    DOB: 04/17/1950, 63 y.o.   MRN: [id number]  HPI  Mariah Henderson is a 63yo woman with PMH of DM2 (diet controlled), HTN, HLD, Asthma/allergy and OA who presents for routine follow up.   Mariah Henderson reports she has "cheated" on her diet a little. I reviewed her BS log which showed ranges from 100s to mid 160s.  Her A1C is 6.7 today.  She is monitoring her diet well and reads labels on the food she buys.    She has heel spurs which cause her some pain with walking.  She has bought some sneakers that help somewhat so that she can exercise.  Breathing is good, she has been using her inhaler a couple of times a day.  She needs refills on her medications.    She was told that she had a spot on her lung a long time ago from Dr. Nadir.  She has previously worked in a mill, [employer].  She believes her breathing problems started after working in the mill.  She is in discussion with a group to see if she needs to speak to a lawyer.    Never smoker.   Current Outpatient Prescriptions on File Prior to Visit  Medication Sig Dispense Refill  . acetaminophen (TYLENOL) 500 MG tablet Take 1 tablet (500 mg total) by mouth every 4 (four) hours as needed for pain. Do not take more than 6 times per day  40 tablet  2  . albuterol (PROVENTIL HFA;VENTOLIN HFA) 108 (90 BASE) MCG/ACT inhaler Inhale 2 puffs into the lungs every 6 (six) hours as needed for wheezing.  1 Inhaler  6  . albuterol (PROVENTIL) (2.5 MG/3ML) 0.083% nebulizer solution Take 3 mLs (2.5 mg total) by nebulization every 6 (six) hours as needed for wheezing.  75 mL  12  . aspirin (ASPIR-81) 81 MG EC tablet Take 81 mg by mouth daily.        . atorvastatin (LIPITOR) 40 MG tablet Take 1 tablet (40 mg total) by mouth daily.  90 tablet  3  . benazepril (LOTENSIN) 20 MG tablet take 1 tablet by mouth once daily  30 tablet  6  . Blood Glucose Monitoring Suppl (ONE TOUCH ULTRA MINI) W/DEVICE KIT Use to check  blood sugars once daily. Dx code: 250.00.  1 each  0  . Blood Glucose Monitoring Suppl (PRODIGY BLOOD GLUCOSE MONITOR) DEVI Use as instructed       . calcium carbonate (OS-CAL) 600 MG TABS Take 600 mg by mouth 2 (two) times daily.        . Cholecalciferol (VITAMIN D3) 1000 UNITS CAPS Take 2 tablets by mouth daily.        . divalproex (DEPAKOTE ER) 250 MG 24 hr tablet Take 4 tablets (1,000 mg total) by mouth at bedtime.  30 tablet  2  . fluticasone (FLONASE) 50 MCG/ACT nasal spray instill 1 spray into each nostril once daily  16 g  11  . Fluticasone-Salmeterol (ADVAIR DISKUS) 100-50 MCG/DOSE AEPB Inhale 1 puff into the lungs 2 (two) times daily.  60 each  6  . gabapentin (NEURONTIN) 300 MG capsule Take 300 mg by mouth at bedtime.        . hydrochlorothiazide (HYDRODIURIL) 25 MG tablet take 1 tablet by mouth once daily  30 tablet  6  . ipratropium (ATROVENT) 0.02 % nebulizer solution Take 2.5 mLs (0.5 mg total) by nebulization every 4 (four)   hours as needed for wheezing.  75 mL  2  . latanoprost (XALATAN) 0.005 % ophthalmic solution       . loratadine (CLARITIN) 10 MG tablet Take 1 tablet (10 mg total) by mouth daily.  30 tablet  1  . meclizine (ANTIVERT) 12.5 MG tablet Take 12.5 mg by mouth 3 (three) times daily as needed.       . omeprazole (PRILOSEC) 20 MG capsule take 2 capsules by mouth once daily  60 capsule  5  . ONE TOUCH LANCETS MISC Use to check blood sugars once daily. Dx code: 250.00.  200 each  2  . ONE TOUCH ULTRA TEST test strip USE TO CHECK BLOOD SUGARS ONCE DAILY  50 each  6  . PARoxetine (PAXIL) 20 MG tablet Take 20 mg by mouth every morning.        . traMADol (ULTRAM) 50 MG tablet Take 1 tablet (50 mg total) by mouth every 6 (six) hours as needed for pain.  60 tablet  3  . triamcinolone cream (KENALOG) 0.1 % Apply 1 application topically 2 (two) times daily. To rash on thigh.  30 g  0  . zolpidem (AMBIEN) 10 MG tablet Take 10 mg by mouth at bedtime as needed.       No current  facility-administered medications on file prior to visit.      Review of Systems  Constitutional: Negative for fever, fatigue and unexpected weight change.  Eyes: Negative for pain and redness.  Respiratory: Negative for cough and shortness of breath.   Cardiovascular: Positive for leg swelling (chronic, mild). Negative for chest pain.  Gastrointestinal: Negative for nausea, abdominal pain and diarrhea.  Genitourinary: Negative for dysuria and difficulty urinating.  Musculoskeletal: Positive for gait problem (due to foot pain). Negative for joint swelling.       Pain from heal spurs bilaterally  Skin: Positive for rash (thighs, on kenalog). Negative for wound.  Neurological: Negative for dizziness and weakness.  Psychiatric/Behavioral: Negative for confusion and decreased concentration.       Objective:   Physical Exam  Constitutional: She is oriented to person, place, and time. She appears well-developed and well-nourished. No distress.  HENT:  Head: Normocephalic and atraumatic.  Wears glasses  Cardiovascular: Normal rate, regular rhythm, normal heart sounds and intact distal pulses.  Exam reveals no friction rub.   Pulmonary/Chest: Effort normal and breath sounds normal. No respiratory distress.  Abdominal: Soft. She exhibits no distension.  Musculoskeletal: She exhibits edema (non pitting to ankles). She exhibits no tenderness.  Neurological: She is alert and oriented to person, place, and time.  Skin: Skin is warm and dry. No erythema.  Psychiatric: She has a normal mood and affect. Her behavior is normal.   BMP, lipid     Assessment & Plan:  RTC in 4 months, sooner if needed 

## 2013-06-19 MED ORDER — OMEPRAZOLE 20 MG PO CPDR: 40.0000 mg | DELAYED_RELEASE_CAPSULE | Freq: Every day | ORAL | Status: DC

## 2013-06-19 MED ORDER — BENAZEPRIL HCL 20 MG PO TABS: 20.0000 mg | ORAL_TABLET | Freq: Every day | ORAL | Status: DC

## 2013-06-19 MED ORDER — HYDROCHLOROTHIAZIDE 25 MG PO TABS: 25.0000 mg | ORAL_TABLET | Freq: Every day | ORAL | Status: DC

## 2013-06-19 MED ORDER — FLUTICASONE-SALMETEROL 100-50 MCG/DOSE IN AEPB: 1.0000 | INHALATION_SPRAY | Freq: Two times a day (BID) | RESPIRATORY_TRACT | Status: DC

## 2013-06-19 MED ORDER — ALBUTEROL SULFATE HFA 108 (90 BASE) MCG/ACT IN AERS: 2.0000 | INHALATION_SPRAY | Freq: Four times a day (QID) | RESPIRATORY_TRACT | Status: DC | PRN

## 2013-06-19 NOTE — Assessment & Plan Note (Signed)
BP Readings from Last 3 Encounters:  06/18/13 123/74  02/17/13 115/78  10/21/12 125/80    Lab Results  Component Value Date   NA 140 06/18/2013   K 4.5 06/18/2013   CREATININE 0.84 06/18/2013    Assessment: Blood pressure control: controlled Progress toward BP goal:  at goal Comments: trying to excercise  Plan: Medications:  continue current medications Educational resources provided:   Self management tools provided:   Other plans:

## 2013-06-19 NOTE — Assessment & Plan Note (Signed)
Last LDL in 2014 was 73.  Recheck today.  Continue Lipitor.

## 2013-06-19 NOTE — Assessment & Plan Note (Signed)
Related to bone spurs.

## 2013-06-19 NOTE — Addendum Note (Signed)
Addended by: Gilles Chiquito B on: 06/19/2013 10:13 AM   Modules accepted: Orders, Medications

## 2013-06-19 NOTE — Assessment & Plan Note (Addendum)
Stable, using rescue inhaler up to twice a day which is normal for her.   She is interested to find out if her lung disease is related to working in [employer].  She is looking in to that now.

## 2013-06-19 NOTE — Assessment & Plan Note (Signed)
Mammogram: 3/31 - she reports negative Dexa: 03/15/12 - osteopenia, on calcium and vitamin d Flu: seasonally Colonoscopy: 2009, several polyps, apparently due for repeat in 10 years Pneumovax: 2012 Tetanus: 2008 No longer gets pap's due to hysterectomy for benign reasons (endometriosis)

## 2013-06-19 NOTE — Assessment & Plan Note (Signed)
Lab Results  Component Value Date   HGBA1C 6.7 06/18/2013   HGBA1C 6.6 02/17/2013   HGBA1C 6.8 10/21/2012     Assessment: Diabetes control: good control (HgbA1C at goal) Progress toward A1C goal:  at goal Comments: She reports some cheating on her diet.   Plan: Medications:  continue current plan Home glucose monitoring: Frequency: once a day Timing: before breakfast Instruction/counseling given: reminded to get eye exam, reminded to bring blood glucose meter & log to each visit, discussed foot care, discussed the need for weight loss and discussed diet Educational resources provided: brochure Self management tools provided: copy of home glucose meter download Other plans:   LDL < 100, BP controlled, checking renal function today, eye exam done in 11/2012, she is due for glasses.  Foot exam today was normal, pulses were palpable in LE easily.

## 2013-07-07 ENCOUNTER — Encounter: Payer: Self-pay | Admitting: Internal Medicine

## 2013-07-07 ENCOUNTER — Ambulatory Visit (INDEPENDENT_AMBULATORY_CARE_PROVIDER_SITE_OTHER): Payer: Medicare HMO | Admitting: Internal Medicine

## 2013-07-07 VITALS — BP 114/76 | HR 78 | Temp 97.0°F | Resp 20 | Ht 59.0 in | Wt 196.7 lb

## 2013-07-07 DIAGNOSIS — E119 Type 2 diabetes mellitus without complications: Secondary | ICD-10-CM

## 2013-07-07 DIAGNOSIS — R82998 Other abnormal findings in urine: Secondary | ICD-10-CM

## 2013-07-07 DIAGNOSIS — R8281 Pyuria: Secondary | ICD-10-CM

## 2013-07-07 LAB — POCT URINALYSIS DIPSTICK
Bilirubin, UA: NEGATIVE
Blood, UA: NEGATIVE
Glucose, UA: NEGATIVE
Ketones, UA: NEGATIVE
Leukocytes, UA: NEGATIVE
Nitrite, UA: NEGATIVE
Protein, UA: NEGATIVE
Spec Grav, UA: 1.02
Urobilinogen, UA: 0.2
pH, UA: 5.5

## 2013-07-07 LAB — GLUCOSE, CAPILLARY: Glucose-Capillary: 105 mg/dL — ABNORMAL HIGH (ref 70–99)

## 2013-07-07 MED ORDER — CIPROFLOXACIN HCL 500 MG PO TABS: 500.0000 mg | ORAL_TABLET | Freq: Two times a day (BID) | ORAL | Status: AC

## 2013-07-07 NOTE — Progress Notes (Signed)
Subjective:    Patient ID: Valetta Mulroy, female    DOB: Aug 20, 1950, 63 y.o.   MRN: [id number]  HPI Ms. Townley is a 63 yo woman pmh as listed below presents for acute dysuria and pyuria.   Pt states this started 4 days ago and consisted of intense burning when she micturates and some suprapubic pain.  Nothing really relieves the pain and urinating exacerbates the pain. She has not had any vaginal discharge, new sexual partners, gross hematuria, fevers/chills, or loss of appetite. Her only other symptoms have been for the last 2 days increase in nausea and "just feeling blah."   Past Medical History  Diagnosis Date  . Diabetes mellitus   . Hypertension   . Hyperlipidemia   . Low back pain   . Cervical spondylosis   . Obesity   . Depression     followed by [name]; no longer of depakote, seroquel  . Chronic serous otitis media   . Foot drop   . Constipation   . Paraumbilical hernia    Current Outpatient Prescriptions on File Prior to Visit  Medication Sig Dispense Refill  . acetaminophen (TYLENOL) 500 MG tablet Take 1 tablet (500 mg total) by mouth every 4 (four) hours as needed for pain. Do not take more than 6 times per day  40 tablet  2  . albuterol (PROVENTIL HFA;VENTOLIN HFA) 108 (90 BASE) MCG/ACT inhaler Inhale 2 puffs into the lungs every 6 (six) hours as needed for wheezing.  1 Inhaler  6  . albuterol (PROVENTIL) (2.5 MG/3ML) 0.083% nebulizer solution Take 3 mLs (2.5 mg total) by nebulization every 6 (six) hours as needed for wheezing.  75 mL  12  . aspirin (ASPIR-81) 81 MG EC tablet Take 81 mg by mouth daily.        Marland Kitchen atorvastatin (LIPITOR) 40 MG tablet Take 1 tablet (40 mg total) by mouth daily.  90 tablet  3  . benazepril (LOTENSIN) 20 MG tablet Take 1 tablet (20 mg total) by mouth daily.  30 tablet  6  . Blood Glucose Monitoring Suppl (ONE TOUCH ULTRA MINI) W/DEVICE KIT Use to check blood sugars once daily. Dx code: 250.00.  1 each  0  . Blood Glucose Monitoring  Suppl (PRODIGY BLOOD GLUCOSE MONITOR) DEVI Use as instructed       . calcium carbonate (OS-CAL) 600 MG TABS Take 600 mg by mouth 2 (two) times daily.        . Cholecalciferol (VITAMIN D3) 1000 UNITS CAPS Take 2 tablets by mouth daily.        . divalproex (DEPAKOTE ER) 250 MG 24 hr tablet Take 4 tablets (1,000 mg total) by mouth at bedtime.  30 tablet  2  . fluticasone (FLONASE) 50 MCG/ACT nasal spray instill 1 spray into each nostril once daily  16 g  11  . Fluticasone-Salmeterol (ADVAIR DISKUS) 100-50 MCG/DOSE AEPB Inhale 1 puff into the lungs 2 (two) times daily.  60 each  6  . gabapentin (NEURONTIN) 300 MG capsule Take 300 mg by mouth at bedtime.        . hydrochlorothiazide (HYDRODIURIL) 25 MG tablet Take 1 tablet (25 mg total) by mouth daily.  30 tablet  6  . ipratropium (ATROVENT) 0.02 % nebulizer solution Take 2.5 mLs (0.5 mg total) by nebulization every 4 (four) hours as needed for wheezing.  75 mL  2  . latanoprost (XALATAN) 0.005 % ophthalmic solution       . loratadine (CLARITIN)  10 MG tablet Take 1 tablet (10 mg total) by mouth daily.  30 tablet  1  . meclizine (ANTIVERT) 12.5 MG tablet Take 12.5 mg by mouth 3 (three) times daily as needed.       Marland Kitchen omeprazole (PRILOSEC) 20 MG capsule Take 2 capsules (40 mg total) by mouth daily.  60 capsule  5  . ONE TOUCH LANCETS MISC Use to check blood sugars once daily. Dx code: 250.00.  200 each  2  . ONE TOUCH ULTRA TEST test strip USE TO CHECK BLOOD SUGARS ONCE DAILY  50 each  6  . PARoxetine (PAXIL) 20 MG tablet Take 20 mg by mouth every morning.        . traMADol (ULTRAM) 50 MG tablet Take 1 tablet (50 mg total) by mouth every 6 (six) hours as needed for pain.  60 tablet  3  . triamcinolone cream (KENALOG) 0.1 % Apply 1 application topically 2 (two) times daily. To rash on thigh.  30 g  0  . zolpidem (AMBIEN) 10 MG tablet Take 10 mg by mouth at bedtime as needed.       No current facility-administered medications on file prior to visit.    Social, surgical, family history reviewed with patient and updated in appropriate chart locations.   Review of Systems  History obtained from chart review and the patient General ROS: positive for  - chills and fatigue negative for - fever or night sweats Cardiovascular ROS: no chest pain or dyspnea on exertion Gastrointestinal ROS: no abdominal pain, change in bowel habits, or black or bloody stools but having some lower pelvic/suprapubic pain Genito-Urinary ROS: positive for - pelvic pain, urinary frequency/urgency and pyuria     Objective:   Physical Exam Filed Vitals:   07/07/13 1323  BP: 94/68  Pulse: 76  Temp: 97 F (36.1 C)  Resp: 20   General: sitting in chair, NAD HEENT: PERRL, EOMI, no scleral icterus Cardiac: RRR, no rubs, murmurs or gallops Pulm: clear to auscultation bilaterally, moving normal volumes of air Abd: soft, suprapubic tenderness, nondistended, no CVA tenderness, BS present Ext: warm and well perfused, no pedal edema Neuro: alert and oriented X3, cranial nerves II-XII grossly intact    Assessment & Plan:  Please see problem oriented charting  Pt discussed with Dr. Lynnae January

## 2013-07-07 NOTE — Patient Instructions (Addendum)
General Instructions: For your urine infection please take your medicine Cipro for 7 days and come back if your symptoms get worse.   Please bring your medicines with you each time you come to clinic.  Medicines may include prescription medications, over-the-counter medications, herbal remedies, eye drops, vitamins, or other pills.   Progress Toward Treatment Goals:  Treatment Goal 06/18/2013  Hemoglobin A1C at goal  Blood pressure at goal    Self Care Goals & Plans:  Self Care Goal 06/18/2013  Manage my medications take my medicines as prescribed; bring my medications to every visit; refill my medications on time  Monitor my health bring my glucose meter and log to each visit  Eat healthy foods eat smaller portions; eat baked foods instead of fried foods; eat foods that are low in salt; drink diet soda or water instead of juice or soda  Be physically active take a walk every day  Other walking 45 min    Home Blood Glucose Monitoring 06/18/2013  Check my blood sugar once a day  When to check my blood sugar before breakfast     Care Management & Community Referrals:  Referral 06/18/2013  Referrals made for care management support -  Referrals made to [facility]

## 2013-07-07 NOTE — Assessment & Plan Note (Signed)
Given pt is a diabetic and having symptoms of UTI such as ttp over suprapubic area and pyuria. Pt not having any vaginal symptoms but new soap use could be contributing to some vaginal irritation that maybe masking true etiology.  -UA, UCx -Cipro 500mg  BID x7 days for complicated cystitis

## 2013-07-08 LAB — URINALYSIS, ROUTINE W REFLEX MICROSCOPIC
Bilirubin Urine: NEGATIVE
Glucose, UA: NEGATIVE mg/dL
Hgb urine dipstick: NEGATIVE
Ketones, ur: NEGATIVE mg/dL
Leukocytes, UA: NEGATIVE
Nitrite: NEGATIVE
Protein, ur: NEGATIVE mg/dL
Specific Gravity, Urine: 1.018 (ref 1.005–1.030)
Urobilinogen, UA: 1 mg/dL (ref 0.0–1.0)
pH: 5 (ref 5.0–8.0)

## 2013-07-08 NOTE — Progress Notes (Signed)
Case discussed with Dr. Sadek soon after the resident saw the patient.  We reviewed the resident's history and exam and pertinent patient test results.  I agree with the assessment, diagnosis, and plan of care documented in the resident's note. 

## 2013-07-09 LAB — URINE CULTURE: Colony Count: 25000

## 2013-07-22 ENCOUNTER — Ambulatory Visit (INDEPENDENT_AMBULATORY_CARE_PROVIDER_SITE_OTHER): Payer: Medicare HMO

## 2013-07-22 VITALS — BP 119/69 | HR 77 | Resp 16 | Ht 61.0 in | Wt 192.0 lb

## 2013-07-22 DIAGNOSIS — M722 Plantar fascial fibromatosis: Secondary | ICD-10-CM

## 2013-07-22 DIAGNOSIS — M19079 Primary osteoarthritis, unspecified ankle and foot: Secondary | ICD-10-CM

## 2013-07-22 DIAGNOSIS — M7730 Calcaneal spur, unspecified foot: Secondary | ICD-10-CM

## 2013-07-22 DIAGNOSIS — R609 Edema, unspecified: Secondary | ICD-10-CM

## 2013-07-22 DIAGNOSIS — M7660 Achilles tendinitis, unspecified leg: Secondary | ICD-10-CM

## 2013-07-22 NOTE — Progress Notes (Signed)
   Subjective:    [name], female    DOB: [birth year], 63 y.o.   MRN: [id number]  HPI Comments: i have heel pain in both of my heels. i have had it for 5 - 6 months. Its getting worse. It hurts to walk. i soak my feet in epsom salt and take tramadol.   Foot Pain      Review of Systems no new findings or systemic changes noted     Objective:   Physical Exam Is a 63 year old female presents this time for followup well-developed well-nourished oriented x3 has a recurrence of her posterior heel pain patient had previous surgery on the right heel with retrocalcaneal spur resection and he'll lysis however now left heel is acutely painful tender and symptomatic although the right still painful as well patient also is history of diabetes with neuropathy and complications. Has significant arthropathy as well.  Lower extremity objective findings as follows vascular status is intact with pedal pulses palpable DP +2/4 bilateral PT plus one over 4 bilateral capillary refill time 3 seconds epicritic sensations intact and symmetric bilateral mild paresthesias are noted there is decreased sensation Semmes Weinstein testing to the toes. There is normal plantar response DTRs not elicited. Neurologically skin color pigment normal hair growth absent nails criptotic and friable orthopedic biomechanical exam rectus foot type ankle mid tarsus subtalar joint motions normal there is well-developed inferior retrocalcaneal spurring on the left history of resection of retrocalcaneal spurring on the right with regrowth of spur or osteophyte intact anchor fixation still present. Patient ambulating in a pair of sandals or crocs indicates her heel pain is exacerbated gotten worse more significantly on the left than the right no open wounds ulcerations no signs of infection otherwise noted. Patient ready taking pain and anti-for inflammatory medications as well as neuropathic medications.       Assessment &  Plan:  Assessment retrocalcaneal spurring as well as tendinosis of the Achilles tendon left more so than right left being keep tendinosis cannot rule out partial tear rupture at this time patient placed in air fracture walker for the left heel and Achilles tendon immobilizer 3-4 weeks as instructed also recommended warm compress ice pack at the applications maintain her current medication regimen. Patient is also given a prescription for compounding pharmacy [facility]. Prescription for compounding agent anti-inflammatory agent including baclofen 2% cyclobenzaprine 2% gabapentin 6% ketoprofen 20% and lidocaine 2.5% patient applied to the affected area 3-4 times daily as instructed will followup in one month for reevaluation patient may be candidate for more invasive options possibly the surgical intervention if conservative care fails  Harriet Masson DPM

## 2013-07-22 NOTE — Patient Instructions (Signed)
ICE INSTRUCTIONS  Apply ice or cold pack to the affected area at least 3 times a day for 10-15 minutes each time.  You should also use ice after prolonged activity or vigorous exercise.  Do not apply ice longer than 20 minutes at one time.  Always keep a cloth between your skin and the ice pack to prevent burns.  Being consistent and following these instructions will help control your symptoms.  We suggest you purchase a gel ice pack because they are reusable and do bit leak.  Some of them are designed to wrap around the area.  Use the method that works best for you.  Here are some other suggestions for icing.   Use a frozen bag of peas or corn-inexpensive and molds well to your body, usually stays frozen for 10 to 20 minutes.  Wet a towel with cold water and squeeze out the excess until it's damp.  Place in a bag in the freezer for 20 minutes. Then remove and use.  Alternate applying warm compress and ice pack 10 minutes of heat in 10 minutes of ice tenderness, repeat again 10 minutes of heat in 10 minutes of ice

## 2013-07-23 ENCOUNTER — Telehealth: Payer: Self-pay | Admitting: *Deleted

## 2013-07-23 NOTE — Telephone Encounter (Signed)
Prescription sent to [facility] for compounding cream for Inflammation, per Dr. Blenda Mounts. Musculoskeletal Pain- Inflammation-TMJ Baclofen 2%, Cyclobenzaprine 2%, Gabapentin 6%, Ketoprofen 20% and Lidocaine 2.5% Quantity 240gm tube, 1 additional refill, diagnosis: Plantar Facial Fibromatosis and Achilles Tendinitis    [facility] [address] [place], [place]  [address] Ph:  [phone]

## 2013-07-30 ENCOUNTER — Other Ambulatory Visit: Payer: Self-pay | Admitting: Internal Medicine

## 2013-08-20 ENCOUNTER — Ambulatory Visit: Payer: Medicare HMO

## 2013-10-31 ENCOUNTER — Other Ambulatory Visit: Payer: Self-pay

## 2013-11-21 ENCOUNTER — Encounter: Payer: Self-pay | Admitting: Internal Medicine

## 2013-11-21 ENCOUNTER — Ambulatory Visit (INDEPENDENT_AMBULATORY_CARE_PROVIDER_SITE_OTHER): Payer: Medicare HMO | Admitting: Internal Medicine

## 2013-11-21 ENCOUNTER — Ambulatory Visit (HOSPITAL_COMMUNITY)
Admission: RE | Admit: 2013-11-21 | Discharge: 2013-11-21 | Disposition: A | Discharge status: Home / Self Care | Payer: Medicare HMO | Source: Ambulatory Visit | Attending: Internal Medicine | Admitting: Internal Medicine

## 2013-11-21 VITALS — BP 118/58 | HR 75 | Temp 97.9°F | Ht 60.0 in | Wt 195.4 lb

## 2013-11-21 DIAGNOSIS — M25551 Pain in right hip: Secondary | ICD-10-CM | POA: Insufficient documentation

## 2013-11-21 DIAGNOSIS — N898 Other specified noninflammatory disorders of vagina: Secondary | ICD-10-CM | POA: Insufficient documentation

## 2013-11-21 DIAGNOSIS — J4520 Mild intermittent asthma, uncomplicated: Secondary | ICD-10-CM

## 2013-11-21 DIAGNOSIS — Z23 Encounter for immunization: Secondary | ICD-10-CM

## 2013-11-21 DIAGNOSIS — Z0000 Encounter for general adult medical examination without abnormal findings: Secondary | ICD-10-CM

## 2013-11-21 DIAGNOSIS — E119 Type 2 diabetes mellitus without complications: Secondary | ICD-10-CM

## 2013-11-21 DIAGNOSIS — I10 Essential (primary) hypertension: Secondary | ICD-10-CM

## 2013-11-21 LAB — POCT GLYCOSYLATED HEMOGLOBIN (HGB A1C): Hemoglobin A1C: 6.7

## 2013-11-21 LAB — GLUCOSE, CAPILLARY: Glucose-Capillary: 89 mg/dL (ref 70–99)

## 2013-11-21 MED ORDER — ALBUTEROL SULFATE (2.5 MG/3ML) 0.083% IN NEBU: 2.5000 mg | INHALATION_SOLUTION | Freq: Four times a day (QID) | RESPIRATORY_TRACT | Status: DC | PRN

## 2013-11-21 MED ORDER — IBUPROFEN 600 MG PO TABS: 600.0000 mg | ORAL_TABLET | Freq: Three times a day (TID) | ORAL | Status: DC | PRN

## 2013-11-21 MED ORDER — ACETAMINOPHEN 500 MG PO TABS: 500.0000 mg | ORAL_TABLET | ORAL | Status: DC | PRN

## 2013-11-21 MED ORDER — IPRATROPIUM BROMIDE 0.02 % IN SOLN: 0.5000 mg | RESPIRATORY_TRACT | Status: DC | PRN

## 2013-11-21 NOTE — Assessment & Plan Note (Addendum)
Concern for OA in right hip vs bursitis Will Xray Alternate Tylenol 500 mg q4 with Ibuprofen 600 mg tid prn  Pt to disc with orthopedic MD as well

## 2013-11-21 NOTE — Assessment & Plan Note (Signed)
Controlled today 

## 2013-11-21 NOTE — Progress Notes (Addendum)
   Subjective:    [name], female    DOB: 1950/08/21, 63 y.o.   MRN: [id number]  HPI Comments: 63 y.o PMH HTN, HLD, depression, DJD, diet controlled DM  She presents for  1. Rash in left vagina/groin area x a few days.  She is using Oil of Olay in the private area and cornstarch.  It burns when water touches the area.  2. DM 2 f/u- cbg 89 with HA1C 6.7 today diet controlled. Meter readings reviewed checks daily and readings 120s-140s some in the 90s no lows <70.   3. Right Hip pain worse x 4-5 months pain is daily, constant 10/10 radiating to right knee.  She is taking Tylenol 500 mg qhs and tried Ultram and OTC arthritis cream.  Tylenol does not help.  Pain is worse with water aerobics and lying on the right side and worse at night.  She has not tried Ibuprofen. She does f/u with ortho for her feet Dr. Tommie Henderson ortho office near [employer].    HM-wants flu shot today, will sch f/u with eye MD when gets $40 SH-She is doing water aerobics.       Review of Systems  Respiratory: Negative for shortness of breath.   Cardiovascular: Negative for chest pain.  Gastrointestinal: Negative for abdominal pain.  Musculoskeletal: Positive for arthralgias.       Objective:   Physical Exam  Constitutional: She is oriented to person, place, and time. Vital signs are normal. She appears well-developed and well-nourished. She is cooperative. No distress.  HENT:  Head: Normocephalic and atraumatic.  Mouth/Throat: Oropharynx is clear and moist and mucous membranes are normal. No oropharyngeal exudate.  Eyes: Conjunctivae are normal. Right eye exhibits no discharge. Left eye exhibits no discharge. No scleral icterus.  Cardiovascular: Normal rate, regular rhythm, S1 normal, S2 normal and normal heart sounds.   No murmur heard. Pulmonary/Chest: Effort normal and breath sounds normal. No respiratory distress. She has no wheezes.  Abdominal: Soft. Bowel sounds are normal. There is no tenderness.    Genitourinary:     Musculoskeletal:       Legs: Neurological: She is alert and oriented to person, place, and time. Gait normal.  CN 2-12 grossly intact, moving all 4 ext   Skin: Skin is warm and dry. No rash noted. She is not diaphoretic.  See GU  Psychiatric: She has a normal mood and affect. Her speech is normal and behavior is normal. Judgment and thought content normal. Cognition and memory are normal.  Nursing note and vitals reviewed.         Assessment & Plan:  F/u 12/17/13 with PCP

## 2013-11-21 NOTE — Patient Instructions (Signed)
General Instructions: Follow up 12/17/13 as scheduled  Get your Xray of your hip Use mild soaps in the private area (dove or unscented soap) Try Tylenol 500 mg q4 hours as needed alternating with Advil for pain   Please bring your medicines with you each time you come to clinic.  Medicines may include prescription medications, over-the-counter medications, herbal remedies, eye drops, vitamins, or other pills.   Progress Toward Treatment Goals:  Treatment Goal 11/21/2013  Hemoglobin A1C at goal  Blood pressure at goal    Self Care Goals & Plans:  Self Care Goal 11/21/2013  Manage my medications take my medicines as prescribed; bring my medications to every visit; refill my medications on time; follow the sick day instructions if I am sick  Monitor my health keep track of my blood glucose; bring my glucose meter and log to each visit; keep track of my blood pressure; bring my blood pressure log to each visit; keep track of my weight; check my feet daily  Eat healthy foods drink diet soda or water instead of juice or soda; eat more vegetables; eat foods that are low in salt; eat baked foods instead of fried foods; eat fruit for snacks and desserts; eat smaller portions  Be physically active find an activity I enjoy  Other -  Meeting treatment goals maintain the current self-care plan    Home Blood Glucose Monitoring 11/21/2013  Check my blood sugar once a day  When to check my blood sugar before meals     Care Management & Community Referrals:  Referral 11/21/2013  Referrals made for care management support none needed  Referrals made to community resources none         Treatment Goals:  Goals (1 Years of Data) as of 11/21/13          As of Today 07/22/13 07/07/13 07/07/13 06/18/13     Blood Pressure   . Blood Pressure < 140/90  118/58 119/69 114/76 94/68 123/74     Diet   . Eat 3 fruit servings each day           Result Component   . HEMOGLOBIN A1C < 7.0  6.7    6.7   .  LDL CALC < 100      89      Progress Toward Treatment Goals:  Treatment Goal 11/21/2013  Hemoglobin A1C at goal  Blood pressure at goal    Self Care Goals & Plans:  Self Care Goal 11/21/2013  Manage my medications take my medicines as prescribed; bring my medications to every visit; refill my medications on time; follow the sick day instructions if I am sick  Monitor my health keep track of my blood glucose; bring my glucose meter and log to each visit; keep track of my blood pressure; bring my blood pressure log to each visit; keep track of my weight; check my feet daily  Eat healthy foods drink diet soda or water instead of juice or soda; eat more vegetables; eat foods that are low in salt; eat baked foods instead of fried foods; eat fruit for snacks and desserts; eat smaller portions  Be physically active find an activity I enjoy  Other -  Meeting treatment goals maintain the current self-care plan    Home Blood Glucose Monitoring 11/21/2013  Check my blood sugar once a day  When to check my blood sugar before meals     Care Management & Community Referrals:  Referral 11/21/2013  Referrals  made for care management support none needed  Referrals made to community resources none    Arthritis, Nonspecific Arthritis is inflammation of a joint. This usually means pain, redness, warmth or swelling are present. One or more joints may be involved. There are a number of types of arthritis. Your caregiver may not be able to tell what type of arthritis you have right away. CAUSES  The most common cause of arthritis is the wear and tear on the joint (osteoarthritis). This causes damage to the cartilage, which can break down over time. The knees, hips, back and neck are most often affected by this type of arthritis. Other types of arthritis and common causes of joint pain include:  Sprains and other injuries near the joint. Sometimes minor sprains and injuries cause pain and swelling that  develop hours later.  Rheumatoid arthritis. This affects hands, feet and knees. It usually affects both sides of your body at the same time. It is often associated with chronic ailments, fever, weight loss and general weakness.  Crystal arthritis. Gout and pseudo gout can cause occasional acute severe pain, redness and swelling in the foot, ankle, or knee.  Infectious arthritis. Bacteria can get into a joint through a break in overlying skin. This can cause infection of the joint. Bacteria and viruses can also spread through the blood and affect your joints.  Drug, infectious and allergy reactions. Sometimes joints can become mildly painful and slightly swollen with these types of illnesses. SYMPTOMS   Pain is the main symptom.  Your joint or joints can also be red, swollen and warm or hot to the touch.  You may have a fever with certain types of arthritis, or even feel overall ill.  The joint with arthritis will hurt with movement. Stiffness is present with some types of arthritis. DIAGNOSIS  Your caregiver will suspect arthritis based on your description of your symptoms and on your exam. Testing may be needed to find the type of arthritis:  Blood and sometimes urine tests.  X-ray tests and sometimes CT or MRI scans.  Removal of fluid from the joint (arthrocentesis) is done to check for bacteria, crystals or other causes. Your caregiver (or a specialist) will numb the area over the joint with a local anesthetic, and use a needle to remove joint fluid for examination. This procedure is only minimally uncomfortable.  Even with these tests, your caregiver may not be able to tell what kind of arthritis you have. Consultation with a specialist (rheumatologist) may be helpful. TREATMENT  Your caregiver will discuss with you treatment specific to your type of arthritis. If the specific type cannot be determined, then the following general recommendations may apply. Treatment of severe joint  pain includes:  Rest.  Elevation.  Anti-inflammatory medication (for example, ibuprofen) may be prescribed. Avoiding activities that cause increased pain.  Only take over-the-counter or prescription medicines for pain and discomfort as recommended by your caregiver.  Cold packs over an inflamed joint may be used for 10 to 15 minutes every hour. Hot packs sometimes feel better, but do not use overnight. Do not use hot packs if you are diabetic without your caregiver's permission.  A cortisone shot into arthritic joints may help reduce pain and swelling.  Any acute arthritis that gets worse over the next 1 to 2 days needs to be looked at to be sure there is no joint infection. Long-term arthritis treatment involves modifying activities and lifestyle to reduce joint stress jarring. This can include weight  loss. Also, exercise is needed to nourish the joint cartilage and remove waste. This helps keep the muscles around the joint strong. HOME CARE INSTRUCTIONS   Do not take aspirin to relieve pain if gout is suspected. This elevates uric acid levels.  Only take over-the-counter or prescription medicines for pain, discomfort or fever as directed by your caregiver.  Rest the joint as much as possible.  If your joint is swollen, keep it elevated.  Use crutches if the painful joint is in your leg.  Drinking plenty of fluids may help for certain types of arthritis.  Follow your caregiver's dietary instructions.  Try low-impact exercise such as:  Swimming.  Water aerobics.  Biking.  Walking.  Morning stiffness is often relieved by a warm shower.  Put your joints through regular range-of-motion. SEEK MEDICAL CARE IF:   You do not feel better in 24 hours or are getting worse.  You have side effects to medications, or are not getting better with treatment. SEEK IMMEDIATE MEDICAL CARE IF:   You have a fever.  You develop severe joint pain, swelling or redness.  Many joints  are involved and become painful and swollen.  There is severe back pain and/or leg weakness.  You have loss of bowel or bladder control. Document Released: 02/10/2004 Document Revised: 03/27/2011 Document Reviewed: 02/26/2008 [facility] Patient Information 2015 [place], [place]. This information is not intended to replace advice given to you by your health care provider. Make sure you discuss any questions you have with your health care provider.

## 2013-11-21 NOTE — Assessment & Plan Note (Signed)
Controlled today HA1C 6.7 cbg 89 today  Continue water aerobics

## 2013-11-21 NOTE — Assessment & Plan Note (Signed)
No evidence of rash, ulceration to indicate HSV Pt to return if concern Try mild soaps for now and Vasoline

## 2013-11-21 NOTE — Assessment & Plan Note (Signed)
Rx refill of Albuterol and Atrovent neb medication

## 2013-11-21 NOTE — Assessment & Plan Note (Signed)
Flu shot today 

## 2013-11-21 NOTE — Addendum Note (Signed)
Addended by: Cresenciano Genre on: 11/21/2013 05:37 PM   Modules accepted: Level of Service

## 2013-11-22 NOTE — Progress Notes (Signed)
Internal Medicine Clinic Attending  Case discussed with Dr. McLean soon after the resident saw the patient.  We reviewed the resident's history and exam and pertinent patient test results.  I agree with the assessment, diagnosis, and plan of care documented in the resident's note. 

## 2013-11-27 ENCOUNTER — Other Ambulatory Visit: Payer: Self-pay | Admitting: Internal Medicine

## 2013-11-27 MED ORDER — BENAZEPRIL HCL 20 MG PO TABS: 20.0000 mg | ORAL_TABLET | Freq: Every day | ORAL | Status: DC

## 2013-12-01 ENCOUNTER — Other Ambulatory Visit: Payer: Self-pay | Admitting: Internal Medicine

## 2013-12-03 NOTE — Telephone Encounter (Signed)
Rx called in to pharmacy. 

## 2013-12-17 ENCOUNTER — Ambulatory Visit (INDEPENDENT_AMBULATORY_CARE_PROVIDER_SITE_OTHER): Payer: Medicare HMO | Admitting: Internal Medicine

## 2013-12-17 ENCOUNTER — Encounter: Payer: Self-pay | Admitting: Internal Medicine

## 2013-12-17 VITALS — BP 131/73 | HR 75 | Temp 98.3°F | Ht 60.0 in | Wt 195.7 lb

## 2013-12-17 DIAGNOSIS — L988 Other specified disorders of the skin and subcutaneous tissue: Secondary | ICD-10-CM

## 2013-12-17 DIAGNOSIS — K219 Gastro-esophageal reflux disease without esophagitis: Secondary | ICD-10-CM

## 2013-12-17 DIAGNOSIS — E119 Type 2 diabetes mellitus without complications: Secondary | ICD-10-CM

## 2013-12-17 DIAGNOSIS — I10 Essential (primary) hypertension: Secondary | ICD-10-CM

## 2013-12-17 DIAGNOSIS — F32A Depression, unspecified: Secondary | ICD-10-CM

## 2013-12-17 DIAGNOSIS — F329 Major depressive disorder, single episode, unspecified: Secondary | ICD-10-CM

## 2013-12-17 DIAGNOSIS — Z0000 Encounter for general adult medical examination without abnormal findings: Secondary | ICD-10-CM

## 2013-12-17 DIAGNOSIS — J4520 Mild intermittent asthma, uncomplicated: Secondary | ICD-10-CM

## 2013-12-17 DIAGNOSIS — J302 Other seasonal allergic rhinitis: Secondary | ICD-10-CM

## 2013-12-17 LAB — GLUCOSE, CAPILLARY: Glucose-Capillary: 137 mg/dL — ABNORMAL HIGH (ref 70–99)

## 2013-12-17 MED ORDER — LORATADINE 10 MG PO TABS: 10.0000 mg | ORAL_TABLET | Freq: Every day | ORAL | Status: DC

## 2013-12-17 MED ORDER — TRIAMCINOLONE ACETONIDE 0.1 % EX CREA: TOPICAL_CREAM | Freq: Two times a day (BID) | CUTANEOUS | Status: DC

## 2013-12-17 NOTE — Assessment & Plan Note (Signed)
Refill Kenalog

## 2013-12-17 NOTE — Assessment & Plan Note (Signed)
BP Readings from Last 3 Encounters:  12/17/13 131/73  11/21/13 118/58  07/22/13 119/69    Lab Results  Component Value Date   NA 140 06/18/2013   K 4.5 06/18/2013   CREATININE 0.84 06/18/2013    Assessment: Blood pressure control: controlled Progress toward BP goal:  at goal Comments: renal function stable  Plan: Medications:  continue current medications, benazepril, hctz Educational resources provided:   Self management tools provided:   Other plans:

## 2013-12-17 NOTE — Assessment & Plan Note (Signed)
Refill Claritin, continue flonase

## 2013-12-17 NOTE — Patient Instructions (Signed)
General Instructions: Please schedule a follow up visit within the next 4 months.   For your medications:   Please bring all of your pill  Bottles with you to each visit.  This will help make sure that we have an up to date list of all the medications you are taking.  Please also bring any over the counter herbal medications you are taking (not including advil, tylenol, etc.)  Please continue taking all of your medications as prescribed  You will be given stretching exercises for the pain in your back/hip which I think is Piriformis syndrome.  Please try the stretches for 1-2 weeks.  If you do not have improvement you may need an injection.  Please call your orthopedic doctor to see if they do steroid injections in the Piriformis before going to see them because of your co-pay.   Thank you!   Treatment Goals:  Goals (1 Years of Data) as of 12/17/13          As of Today 11/21/13 07/22/13 07/07/13 07/07/13     Blood Pressure   . Blood Pressure < 140/90  143/76 118/58 119/69 114/76 94/68     Diet   . Eat 3 fruit servings each day           Result Component   . HEMOGLOBIN A1C < 7.0   6.7      . LDL CALC < 100            Progress Toward Treatment Goals:  Treatment Goal 12/17/2013  Hemoglobin A1C at goal  Blood pressure at goal    Self Care Goals & Plans:  Self Care Goal 12/17/2013  Manage my medications take my medicines as prescribed; bring my medications to every visit; refill my medications on time  Monitor my health bring my glucose meter and log to each visit; keep track of my blood pressure; keep track of my blood glucose; keep track of my weight  Eat healthy foods eat more vegetables; drink diet soda or water instead of juice or soda; eat foods that are low in salt  Be physically active find an activity I enjoy  Other -  Meeting treatment goals -    Home Blood Glucose Monitoring 11/21/2013  Check my blood sugar once a day  When to check my blood sugar before meals      Care Management & Community Referrals:  Referral 11/21/2013  Referrals made for care management support none needed  Referrals made to community resources none

## 2013-12-17 NOTE — Assessment & Plan Note (Signed)
Lab Results  Component Value Date   HGBA1C 6.7 11/21/2013   HGBA1C 6.7 06/18/2013   HGBA1C 6.6 02/17/2013     Assessment: Diabetes control: good control (HgbA1C at goal) Progress toward A1C goal:  at goal Comments: doing well, due for eye exam  Plan: Medications:  continue diet control Home glucose monitoring: Frequency:   Timing:   Instruction/counseling given: reminded to get eye exam and reminded to bring medications to each visit Educational resources provided:   Self management tools provided: copy of home glucose meter download Other plans: LDL at goal at last check.

## 2013-12-17 NOTE — Assessment & Plan Note (Signed)
Mammogram: 04/04/13 - negative Dexa: 03/15/12 - osteopenia Flu: Today Colonoscopy: 2009, several polyps, apparently due for repeat in 10 years Pneumovax: 2012 Tetanus: 2008 No longer gets pap's due to hysterectomy for benign reasons (endometriosis)

## 2013-12-17 NOTE — Progress Notes (Signed)
Subjective:    [name], female    DOB: 1950/05/02, 63 y.o.   MRN: [id number]  CC: hip pain, routine follow up  HPI  Ms. Adelstein is a 63yo woman with PMH of HLD, DM2 diet controlled, RAD with wheezing, bone spurs, HTN, osteoarthritis of the shoulder, who presents for follow up.   Since last being seen, Ms. Steadman has been treated for a UTI and also for hip pain and a groin rash.    She reports today that her hip pain is about the same. She notes that the pain is worse when rising from a seated position, worse with bending down and painful at night when lying on the right side.  She had an Xray of the hip with was basically normal.  She is thinking about seeing her orthopedic surgeon for this.  She does have point tenderness at the piriformis, but not severe.  Occasionally she has shooting pains down the leg due to the pain and in certain positions.    She has a history of a skin rash/plaque for which I referred her to Dermatology for biopsy, however, she was not able to go.  She has been using Kenalog cream and asks for refill today.  Her symptoms of UTI and groin rash are resolved.   Otherwise she is doing well, taking her medications as prescribed.  She has diet controlled DM and A1C last month was 6.7.  I reviewed her CBG log and her BS are ranging from 80s - 150s.  She has HTN for which she takes benazepril, and hctz with goo results, she has allergies and reactive airway disease which is well controlled with advair and claritin.   We reviewed her medications.  She was getting new prescriptions from outside physicians and these were reconciled  Current Outpatient Prescriptions on File Prior to Visit  Medication Sig Dispense Refill  . acetaminophen (TYLENOL) 500 MG tablet Take 1 tablet (500 mg total) by mouth every 4 (four) hours as needed. Do not take more than 6 times per day 120 tablet 0  . albuterol (PROVENTIL HFA;VENTOLIN HFA) 108 (90 BASE) MCG/ACT inhaler Inhale 2  puffs into the lungs every 6 (six) hours as needed for wheezing. 1 Inhaler 6  . albuterol (PROVENTIL) (2.5 MG/3ML) 0.083% nebulizer solution Take 3 mLs (2.5 mg total) by nebulization every 6 (six) hours as needed for wheezing. 75 mL 2  . aspirin (ASPIR-81) 81 MG EC tablet Take 81 mg by mouth daily.      Marland Kitchen atorvastatin (LIPITOR) 40 MG tablet Take 1 tablet (40 mg total) by mouth daily. 90 tablet 3  . benazepril (LOTENSIN) 20 MG tablet Take 1 tablet (20 mg total) by mouth daily. 90 tablet 3  . Blood Glucose Monitoring Suppl (ONE TOUCH ULTRA MINI) W/DEVICE KIT Use to check blood sugars once daily. Dx code: 250.00. 1 each 0  . Blood Glucose Monitoring Suppl (PRODIGY BLOOD GLUCOSE MONITOR) DEVI Use as instructed     . Cholecalciferol (VITAMIN D3) 1000 UNITS CAPS Take 2 tablets by mouth daily.      . fluticasone (FLONASE) 50 MCG/ACT nasal spray instill 1 spray into each nostril once daily 16 g 11  . Fluticasone-Salmeterol (ADVAIR DISKUS) 100-50 MCG/DOSE AEPB Inhale 1 puff into the lungs 2 (two) times daily. 60 each 6  . hydrochlorothiazide (HYDRODIURIL) 25 MG tablet Take 1 tablet (25 mg total) by mouth daily. 30 tablet 6  . ibuprofen (ADVIL,MOTRIN) 600 MG tablet Take 1 tablet (  600 mg total) by mouth every 8 (eight) hours as needed. 90 tablet 0  . ipratropium (ATROVENT) 0.02 % nebulizer solution Take 2.5 mLs (0.5 mg total) by nebulization every 4 (four) hours as needed for wheezing. 75 mL 2  . latanoprost (XALATAN) 0.005 % ophthalmic solution     . meclizine (ANTIVERT) 12.5 MG tablet Take 12.5 mg by mouth 3 (three) times daily as needed.     Marland Kitchen omeprazole (PRILOSEC) 20 MG capsule Take 2 capsules (40 mg total) by mouth daily. 60 capsule 5  . ONE TOUCH LANCETS MISC Use to check blood sugars once daily. Dx code: 250.00. 200 each 2  . ONE TOUCH ULTRA TEST test strip USE TO CHECK BLOOD SUGARS ONCE DAILY 50 each 6  . PARoxetine (PAXIL) 20 MG tablet Take 20 mg by mouth every morning.      . traMADol (ULTRAM) 50  MG tablet take 1 tablet by mouth every 6 hours if needed for pain 60 tablet 3  . triamcinolone cream (KENALOG) 0.1 % apply to affected area twice a day TO RASH ON THIGH 30 g 1  . zolpidem (AMBIEN) 10 MG tablet Take 10 mg by mouth at bedtime as needed.    . calcium carbonate (OS-CAL) 600 MG TABS Take 600 mg by mouth 2 (two) times daily.      . divalproex (DEPAKOTE ER) 250 MG 24 hr tablet Take 4 tablets (1,000 mg total) by mouth at bedtime. 30 tablet 2  . loratadine (CLARITIN) 10 MG tablet Take 1 tablet (10 mg total) by mouth daily. 30 tablet 1   No current facility-administered medications on file prior to visit.    Review of Systems  Constitutional: Negative for fever, chills and unexpected weight change.  HENT: Negative for ear discharge and ear pain.   Eyes: Negative for photophobia and visual disturbance.  Respiratory: Negative for cough, choking and shortness of breath.   Cardiovascular: Negative for chest pain and palpitations.  Gastrointestinal: Negative for nausea, vomiting, blood in stool and abdominal distention.  Genitourinary: Negative for dysuria, enuresis and difficulty urinating.  Musculoskeletal: Positive for arthralgias (both feet bothering her, seeing orthopedic, has heel spurs). Negative for joint swelling and gait problem.  Skin: Positive for rash (chronic on leg).  Neurological: Negative for dizziness, syncope, weakness, light-headedness and headaches.  Psychiatric/Behavioral: Negative for confusion and decreased concentration.  + splinter in finger.       Objective:   Physical Exam  Constitutional: She is oriented to person, place, and time. She appears well-developed and well-nourished.  Elderly woman, NAD  HENT:  Head: Normocephalic and atraumatic.  Eyes: Conjunctivae are normal. No scleral icterus.  Cardiovascular: Normal rate, regular rhythm and normal heart sounds.   No murmur heard. Pulmonary/Chest: Effort normal and breath sounds normal. No respiratory  distress. She has no wheezes.  Abdominal: Soft. Bowel sounds are normal. She exhibits no distension.  Musculoskeletal: She exhibits no edema or tenderness.  + point tenderness over the piriformis on the right.  Gait normal, able to extend and flex at waist normally.  No tenderness in the groin or over the greater trochanter on the right.   Neurological: She is alert and oriented to person, place, and time. She exhibits normal muscle tone. Coordination normal.  Skin: Skin is warm and dry. Rash (+ plague, darkened rash on left lateral thigh, unchanged. ) noted.  Psychiatric: She has a normal mood and affect. Her behavior is normal.  Vitals reviewed.   No labs today, reviewed BMET,  Lipid profile from 06/2013 and A1C from 11/2013.       Assessment & Plan:  RTC in 3-4 months

## 2013-12-17 NOTE — Assessment & Plan Note (Signed)
Continue advair, refill today.

## 2014-01-23 ENCOUNTER — Other Ambulatory Visit: Payer: Self-pay | Admitting: Internal Medicine

## 2014-01-27 ENCOUNTER — Encounter: Payer: Self-pay | Admitting: Internal Medicine

## 2014-03-06 ENCOUNTER — Other Ambulatory Visit: Payer: Self-pay | Admitting: Internal Medicine

## 2014-03-12 ENCOUNTER — Ambulatory Visit (AMBULATORY_SURGERY_CENTER): Payer: Self-pay

## 2014-03-12 VITALS — Ht 60.0 in | Wt 187.0 lb

## 2014-03-12 DIAGNOSIS — Z860100 Personal history of colon polyps, unspecified: Secondary | ICD-10-CM

## 2014-03-12 MED ORDER — MOVIPREP 100 G PO SOLR: 1.0000 | Freq: Once | ORAL | Status: DC

## 2014-03-12 NOTE — Progress Notes (Signed)
Pt gets very upset and has tachycardia with IV sticks.  PLEASE start in antecube if possible.  ONLY 1 stick if possible (which is usual practice anyway).  Just wanted to give admitting nurses heads-up on how this pt reacts.  She said she starts crying and shaking if given too much time to think about it.

## 2014-03-12 NOTE — Progress Notes (Signed)
No allergies to eggs or soy No diet/weight loss meds No past problems with anesthesia No home oxygen  No internet 

## 2014-03-20 ENCOUNTER — Ambulatory Visit (AMBULATORY_SURGERY_CENTER): Payer: Medicare HMO | Admitting: Internal Medicine

## 2014-03-20 ENCOUNTER — Encounter: Payer: Self-pay | Admitting: Internal Medicine

## 2014-03-20 VITALS — BP 133/83 | HR 75 | Temp 97.2°F | Resp 19 | Ht 60.0 in | Wt 187.0 lb

## 2014-03-20 DIAGNOSIS — Z1211 Encounter for screening for malignant neoplasm of colon: Secondary | ICD-10-CM

## 2014-03-20 DIAGNOSIS — D125 Benign neoplasm of sigmoid colon: Secondary | ICD-10-CM

## 2014-03-20 DIAGNOSIS — K635 Polyp of colon: Secondary | ICD-10-CM

## 2014-03-20 DIAGNOSIS — Z86010 Personal history of colonic polyps: Secondary | ICD-10-CM

## 2014-03-20 LAB — GLUCOSE, CAPILLARY: Glucose-Capillary: 99 mg/dL (ref 70–99)

## 2014-03-20 MED ORDER — SODIUM CHLORIDE 0.9 % IV SOLN: 500.0000 mL | INTRAVENOUS | Status: DC

## 2014-03-20 NOTE — Op Note (Signed)
[facility]  [address] [place], [address]   COLONOSCOPY PROCEDURE REPORT  PATIENT: Mariah Henderson, Mariah Henderson  MR#: [id number] BIRTHDATE: 1950-07-03 , 88  yrs. old GENDER: female ENDOSCOPIST: Lafayette Dragon, MD REFERRED GT:XMIWO Daryll Drown, MD PROCEDURE DATE:  03/20/2014 PROCEDURE:   Colonoscopy with cold biopsy polypectomy First Screening Colonoscopy - Avg.  risk and is 50 yrs.  old or older - No.  Prior Negative Screening - Now for repeat screening. N/A  History of Adenoma - Now for follow-up colonoscopy & has been > or = to 3 yrs.  N/A  Polyps Removed Today? Yes. ASA CLASS:   Class II INDICATIONS:several hyperplastic polyps removed on last colonoscopy in March 2009. MEDICATIONS: Monitored anesthesia care and Propofol 200 mg IV  DESCRIPTION OF PROCEDURE:   After the risks benefits and alternatives of the procedure were thoroughly explained, informed consent was obtained.  The digital rectal exam revealed no abnormalities of the rectum.   The LB [id number]  endoscope was introduced through the anus and advanced to the cecum, which was identified by both the appendix and ileocecal valve. No adverse events experienced.   The quality of the prep was good, using MoviPrep  The instrument was then slowly withdrawn as the colon was fully examined.      COLON FINDINGS: Six firm sessile polyps measuring 3 mm in size were found in the sigmoid colon.  A polypectomy was performed with cold forceps.  The resection was complete, the polyp tissue was completely retrieved and sent to histology.   There was moderate diverticulosis noted throughout the entire examined colon with associated muscular hypertrophy, tortuosity and angulation. Retroflexed views revealed no abnormalities. The time to cecum=5 minutes 31 seconds.  Withdrawal time=10 minutes 08 seconds.  The scope was withdrawn and the procedure completed. COMPLICATIONS: There were no immediate  complications.  ENDOSCOPIC IMPRESSION: 1.   Six sessile diminutive  polyps were found in the sigmoid colon; polypectomy was performed with cold forceps 2.   There was moderate diverticulosis noted throughout the entire examined colon  RECOMMENDATIONS: 1.  Await pathology results 2.  High fiber diet 3. recall colonoscopy pending path report eSigned:  Lafayette Dragon, MD 03/20/2014 11:02 AM   cc:   PATIENT NAME:  Mariah Henderson, Mariah Henderson MR#: [id number]

## 2014-03-20 NOTE — Progress Notes (Signed)
Report to PACU, RN, vss, BBS= Clear.  

## 2014-03-20 NOTE — Progress Notes (Signed)
Called to room to assist during endoscopic procedure.  Patient ID and intended procedure confirmed with present staff. Received instructions for my participation in the procedure from the performing physician.  

## 2014-03-20 NOTE — Patient Instructions (Signed)
YOU HAD AN ENDOSCOPIC PROCEDURE TODAY AT THE [facility]:   Refer to the procedure report that was given to you for any specific questions about what was found during the examination.  If the procedure report does not answer your questions, please call your gastroenterologist to clarify.  If you requested that your care partner not be given the details of your procedure findings, then the procedure report has been included in a sealed envelope for you to review at your convenience later.  YOU SHOULD EXPECT: Some feelings of bloating in the abdomen. Passage of more gas than usual.  Walking can help get rid of the air that was put into your GI tract during the procedure and reduce the bloating. If you had a lower endoscopy (such as a colonoscopy or flexible sigmoidoscopy) you may notice spotting of blood in your stool or on the toilet paper. If you underwent a bowel prep for your procedure, you may not have a normal bowel movement for a few days.  Please Note:  You might notice some irritation and congestion in your nose or some drainage.  This is from the oxygen used during your procedure.  There is no need for concern and it should clear up in a day or so.  SYMPTOMS TO REPORT IMMEDIATELY:   Following lower endoscopy (colonoscopy or flexible sigmoidoscopy):  Excessive amounts of blood in the stool  Significant tenderness or worsening of abdominal pains  Swelling of the abdomen that is new, acute  Fever of 100F or higher    For urgent or emergent issues, a gastroenterologist can be reached at any hour by calling [phone].   DIET: Your first meal following the procedure should be a small meal and then it is ok to progress to your normal diet. Heavy or fried foods are harder to digest and may make you feel nauseous or bloated.  Likewise, meals heavy in dairy and vegetables can increase bloating.  Drink plenty of fluids but you should avoid alcoholic beverages for 24  hours.  ACTIVITY:  You should plan to take it easy for the rest of today and you should NOT DRIVE or use heavy machinery until tomorrow (because of the sedation medicines used during the test).    FOLLOW UP: Our staff will call the number listed on your records the next business day following your procedure to check on you and address any questions or concerns that you may have regarding the information given to you following your procedure. If we do not reach you, we will leave a message.  However, if you are feeling well and you are not experiencing any problems, there is no need to return our call.  We will assume that you have returned to your regular daily activities without incident.  If any biopsies were taken you will be contacted by phone or by letter within the next 1-3 weeks.  Please call us at [phone] if you have not heard about the biopsies in 3 weeks.    SIGNATURES/CONFIDENTIALITY: You and/or your care partner have signed paperwork which will be entered into your electronic medical record.  These signatures attest to the fact that that the information above on your After Visit Summary has been reviewed and is understood.  Full responsibility of the confidentiality of this discharge information lies with you and/or your care-partner.   Resume medications. Information given on polyps, diverticulosis and high fiber diet with discharge instructions. 

## 2014-03-23 ENCOUNTER — Telehealth: Payer: Self-pay

## 2014-03-23 NOTE — Telephone Encounter (Signed)
Left a message at [phone] for the pt to call us back if any questions or concerns. maw

## 2014-03-26 ENCOUNTER — Encounter: Payer: Self-pay | Admitting: Internal Medicine

## 2014-04-01 ENCOUNTER — Other Ambulatory Visit: Payer: Self-pay | Admitting: Internal Medicine

## 2014-04-01 ENCOUNTER — Telehealth: Payer: Self-pay | Admitting: *Deleted

## 2014-04-01 DIAGNOSIS — M8580 Other specified disorders of bone density and structure, unspecified site: Secondary | ICD-10-CM | POA: Insufficient documentation

## 2014-04-01 NOTE — Telephone Encounter (Signed)
Call from pt - requesting order for bone density per [facility]; she is due for mammogram also; wants to schedule on same day. Talked to Dr Daryll Drown - received order. Bone density order faxed to [facility].

## 2014-04-22 ENCOUNTER — Ambulatory Visit: Payer: Medicare HMO | Admitting: Internal Medicine

## 2014-04-24 ENCOUNTER — Other Ambulatory Visit: Payer: Self-pay | Admitting: Internal Medicine

## 2014-04-27 ENCOUNTER — Other Ambulatory Visit: Payer: Self-pay | Admitting: Internal Medicine

## 2014-04-28 ENCOUNTER — Other Ambulatory Visit: Payer: Self-pay | Admitting: Radiology

## 2014-04-29 ENCOUNTER — Encounter: Payer: Self-pay | Admitting: Internal Medicine

## 2014-04-29 ENCOUNTER — Ambulatory Visit (INDEPENDENT_AMBULATORY_CARE_PROVIDER_SITE_OTHER): Payer: Medicare HMO | Admitting: Internal Medicine

## 2014-04-29 VITALS — BP 115/61 | HR 76 | Temp 98.0°F | Ht 60.0 in | Wt 184.5 lb

## 2014-04-29 DIAGNOSIS — E119 Type 2 diabetes mellitus without complications: Secondary | ICD-10-CM | POA: Diagnosis not present

## 2014-04-29 DIAGNOSIS — F32A Depression, unspecified: Secondary | ICD-10-CM

## 2014-04-29 DIAGNOSIS — Z7951 Long term (current) use of inhaled steroids: Secondary | ICD-10-CM

## 2014-04-29 DIAGNOSIS — J309 Allergic rhinitis, unspecified: Secondary | ICD-10-CM | POA: Diagnosis not present

## 2014-04-29 DIAGNOSIS — Z0000 Encounter for general adult medical examination without abnormal findings: Secondary | ICD-10-CM

## 2014-04-29 DIAGNOSIS — J4520 Mild intermittent asthma, uncomplicated: Secondary | ICD-10-CM

## 2014-04-29 DIAGNOSIS — L988 Other specified disorders of the skin and subcutaneous tissue: Secondary | ICD-10-CM

## 2014-04-29 DIAGNOSIS — F329 Major depressive disorder, single episode, unspecified: Secondary | ICD-10-CM | POA: Diagnosis not present

## 2014-04-29 DIAGNOSIS — J302 Other seasonal allergic rhinitis: Secondary | ICD-10-CM

## 2014-04-29 DIAGNOSIS — I10 Essential (primary) hypertension: Secondary | ICD-10-CM

## 2014-04-29 DIAGNOSIS — J45909 Unspecified asthma, uncomplicated: Secondary | ICD-10-CM

## 2014-04-29 LAB — GLUCOSE, CAPILLARY: Glucose-Capillary: 125 mg/dL — ABNORMAL HIGH (ref 70–99)

## 2014-04-29 LAB — POCT GLYCOSYLATED HEMOGLOBIN (HGB A1C): Hemoglobin A1C: 6.6

## 2014-04-29 MED ORDER — IPRATROPIUM BROMIDE 0.02 % IN SOLN
0.5000 mg | RESPIRATORY_TRACT | Status: DC | PRN
Start: 2014-04-29 — End: 2016-04-28

## 2014-04-29 MED ORDER — HYDROCHLOROTHIAZIDE 25 MG PO TABS: 25.0000 mg | ORAL_TABLET | Freq: Every day | ORAL | Status: DC

## 2014-04-29 MED ORDER — OMEPRAZOLE 20 MG PO CPDR: 40.0000 mg | DELAYED_RELEASE_CAPSULE | Freq: Every day | ORAL | Status: DC

## 2014-04-29 MED ORDER — FLUTICASONE-SALMETEROL 100-50 MCG/DOSE IN AEPB: 1.0000 | INHALATION_SPRAY | Freq: Two times a day (BID) | RESPIRATORY_TRACT | Status: DC

## 2014-04-29 MED ORDER — TRIAMCINOLONE ACETONIDE 0.1 % EX CREA: TOPICAL_CREAM | Freq: Two times a day (BID) | CUTANEOUS | Status: DC

## 2014-04-29 MED ORDER — ALBUTEROL SULFATE HFA 108 (90 BASE) MCG/ACT IN AERS: 2.0000 | INHALATION_SPRAY | Freq: Four times a day (QID) | RESPIRATORY_TRACT | Status: DC | PRN

## 2014-04-29 NOTE — Assessment & Plan Note (Signed)
Lab Results  Component Value Date   HGBA1C 6.6 04/29/2014   HGBA1C 6.7 11/21/2013   HGBA1C 6.7 06/18/2013     Assessment: Diabetes control: good control (HgbA1C at goal) Progress toward A1C goal:  at goal Comments: She is diet controlled and continues to try to get better with weight loss and diet changes  Plan: Medications:  continue current medications, Continue good lifestyle changes Home glucose monitoring: Frequency:   Timing:   Instruction/counseling given: reminded to get eye exam, reminded to bring blood glucose meter & log to each visit, discussed the need for weight loss and discussed diet Educational resources provided:   Self management tools provided:   Other plans:   LDL is well controlled, she is on atorvastatin.  BP is well controlled.  A1C at goal.  Reminded her about eye exam.

## 2014-04-29 NOTE — Assessment & Plan Note (Signed)
She has been on the same medications for quite a while.  Her mood is stable today, despite recent stressors.  Continue current therapy which includes depakote and paxil.

## 2014-04-29 NOTE — Assessment & Plan Note (Signed)
Doing well today, no issues.  She needs a refill on her albuterol.  She is also on advair with good results.

## 2014-04-29 NOTE — Assessment & Plan Note (Signed)
BP Readings from Last 3 Encounters:  04/29/14 115/61  03/20/14 133/83  12/17/13 131/73    Lab Results  Component Value Date   NA 140 06/18/2013   K 4.5 06/18/2013   CREATININE 0.84 06/18/2013    Assessment: Blood pressure control: controlled Progress toward BP goal:  at goal Comments: well controlled, no issues with medications which include hctz and benazapril  Plan: Medications:  continue current medications Educational resources provided:   Self management tools provided:   Other plans: She asks for 90 day refills.

## 2014-04-29 NOTE — Addendum Note (Signed)
Addended by: Gilles Chiquito B on: 04/29/2014 01:42 PM   Modules accepted: Orders, Medications

## 2014-04-29 NOTE — Patient Instructions (Signed)
General Instructions: Please schedule a follow up visit within the next 3-4 months.   For your medications:   Please bring all of your pill  Bottles with you to each visit.  This will help make sure that we have an up to date list of all the medications you are taking.  Please also bring any over the counter herbal medications you are taking (not including advil, tylenol, etc.)  Please continue taking all of your medications as prescribed.   Great work losing [employer]!  Keep it up!  I am glad you are enjoying water aerobics.   Ask your eye doctor to check your for diabetes in your eye.  It has been a couple of years since you were checked for this.    Thank you!  Please call with any questions!    Treatment Goals:  Goals (1 Years of Data) as of 04/29/14          As of Today 03/20/14 03/20/14 03/20/14 03/20/14     Blood Pressure   . Blood Pressure < 140/90  115/61 133/83 104/54 131/84 152/82     Diet   . Eat 3 fruit servings each day           Result Component   . HEMOGLOBIN A1C < 7.0  6.6       . LDL CALC < 100            Progress Toward Treatment Goals:  Treatment Goal 04/29/2014  Hemoglobin A1C at goal  Blood pressure at goal    Self Care Goals & Plans:  Self Care Goal 04/29/2014  Manage my medications take my medicines as prescribed; bring my medications to every visit; refill my medications on time  Monitor my health keep track of my blood glucose; bring my glucose meter and log to each visit  Eat healthy foods drink diet soda or water instead of juice or soda; eat more vegetables; eat foods that are low in salt; eat baked foods instead of fried foods  Be physically active find an activity I enjoy  Other -  Meeting treatment goals -    Home Blood Glucose Monitoring 11/21/2013  Check my blood sugar once a day  When to check my blood sugar before meals     Care Management & Community Referrals:  Referral 11/21/2013  Referrals made for care management support none  needed  Referrals made to community resources none

## 2014-04-29 NOTE — Progress Notes (Signed)
   Subjective:    [name], female    DOB: 1950-06-07, 64 y.o.   MRN: [id number]  CC: 4 month follow up for DM2  HPI   Mariah Henderson is a 64yo woman with PMH of DM2, HTN, HLD, OA, osteopenia who presents for routine follow up.   Mariah Henderson reports that this year they found a spot on her mammogram and she had a breast biopsy yesterday.  She is due to get the results from the biopsy today.  She is nervous about the results as her sister had breast cancer in a similar spot.  She has had no issues, but some pain at the site of the biopsy.   Since I last saw her, she also had a loss, her ex husband died, with whom she remained in contact as they have a son in common.  She has been dealing with the grief.    In good news, she has successfully lost 10 pounds by doing water aerobics and changing her diet.  I congratulated her on her success.    As pertains to her medications, she has HTN and she is taking HCTZ, benazapril without issues.  She has HLD which I treat and she is on atorvastatin without issue.  She has DM which is diet controlled and her A1C was good today.  She is due for evaluation for retinopathy and I reminded her of this.  She is due to see her eye doctor for glaucoma as well.    She did not bring in her medications, but we discussed them and there have been no changes.    Review of Systems  Constitutional: Negative for fever, activity change and unexpected weight change.  HENT: Negative for ear discharge and ear pain.   Eyes: Negative for photophobia and visual disturbance.  Respiratory: Negative for cough, choking and shortness of breath.   Cardiovascular: Negative for chest pain and leg swelling.  Gastrointestinal: Negative for abdominal pain, diarrhea and constipation.  Genitourinary: Negative for dysuria, enuresis and difficulty urinating.  Musculoskeletal: Negative for back pain, arthralgias and gait problem.  Skin: Negative for rash and wound.    Neurological: Positive for dizziness (occasional vertigo). Negative for weakness and light-headedness.  Psychiatric/Behavioral: Negative for confusion and decreased concentration.       Objective:   Physical Exam  Constitutional: She is oriented to person, place, and time. She appears well-developed and well-nourished. No distress.  HENT:  Head: Normocephalic and atraumatic.  Eyes: Conjunctivae are normal. No scleral icterus.  Cardiovascular: Normal rate, regular rhythm and normal heart sounds.   No murmur heard. Pulmonary/Chest: Effort normal and breath sounds normal. No respiratory distress. She has no wheezes.  Abdominal: Soft. Bowel sounds are normal.  Musculoskeletal: She exhibits edema (mild non pitting edema bilaterally, unchanged). She exhibits no tenderness.  Neurological: She is alert and oriented to person, place, and time.  Skin: Skin is warm and dry.  Psychiatric: She has a normal mood and affect. Her behavior is normal.   No labs at this visit       Assessment & Plan:  RTC in 3-4 months for routine follow up.

## 2014-04-29 NOTE — Assessment & Plan Note (Signed)
Well controlled at the moment, she takes flonase with good results.

## 2014-05-15 ENCOUNTER — Telehealth: Payer: Self-pay | Admitting: *Deleted

## 2014-05-15 NOTE — Telephone Encounter (Signed)
Ask by chilonb. To call pt, that she had called today and must have left her message with some one else, that her blood sugars are hi, pt was called and states she called 2 or 3 days ago because her cbg's were 240 something but that she is doing well now, she is ask again how her cbg's are and she states "good, good, im doing real well." she is ask if she has problems to please call for the on call resident or 911 or come to ED, she is agreeable

## 2014-05-19 ENCOUNTER — Other Ambulatory Visit: Payer: Self-pay | Admitting: General Surgery

## 2014-05-19 DIAGNOSIS — R928 Other abnormal and inconclusive findings on diagnostic imaging of breast: Secondary | ICD-10-CM

## 2014-06-08 ENCOUNTER — Other Ambulatory Visit: Payer: Self-pay | Admitting: Internal Medicine

## 2014-06-17 ENCOUNTER — Encounter (HOSPITAL_BASED_OUTPATIENT_CLINIC_OR_DEPARTMENT_OTHER): Payer: Self-pay | Admitting: *Deleted

## 2014-06-17 NOTE — Pre-Procedure Instructions (Signed)
To come for labs, ekg on Monday 06/22/14 after seed placement

## 2014-06-21 ENCOUNTER — Other Ambulatory Visit: Payer: Self-pay

## 2014-06-21 ENCOUNTER — Encounter (HOSPITAL_COMMUNITY): Payer: Self-pay | Admitting: *Deleted

## 2014-06-21 ENCOUNTER — Emergency Department (HOSPITAL_COMMUNITY)
Admission: EM | Admit: 2014-06-21 | Discharge: 2014-06-21 | Disposition: A | Discharge status: Home / Self Care | Payer: Medicare HMO | Attending: Emergency Medicine | Admitting: Emergency Medicine

## 2014-06-21 ENCOUNTER — Emergency Department (HOSPITAL_COMMUNITY): Payer: Medicare HMO

## 2014-06-21 DIAGNOSIS — F329 Major depressive disorder, single episode, unspecified: Secondary | ICD-10-CM | POA: Diagnosis not present

## 2014-06-21 DIAGNOSIS — E669 Obesity, unspecified: Secondary | ICD-10-CM | POA: Diagnosis not present

## 2014-06-21 DIAGNOSIS — Z79899 Other long term (current) drug therapy: Secondary | ICD-10-CM | POA: Insufficient documentation

## 2014-06-21 DIAGNOSIS — E119 Type 2 diabetes mellitus without complications: Secondary | ICD-10-CM | POA: Insufficient documentation

## 2014-06-21 DIAGNOSIS — E785 Hyperlipidemia, unspecified: Secondary | ICD-10-CM | POA: Diagnosis not present

## 2014-06-21 DIAGNOSIS — Z7951 Long term (current) use of inhaled steroids: Secondary | ICD-10-CM | POA: Insufficient documentation

## 2014-06-21 DIAGNOSIS — K219 Gastro-esophageal reflux disease without esophagitis: Secondary | ICD-10-CM | POA: Diagnosis not present

## 2014-06-21 DIAGNOSIS — I10 Essential (primary) hypertension: Secondary | ICD-10-CM | POA: Insufficient documentation

## 2014-06-21 DIAGNOSIS — J449 Chronic obstructive pulmonary disease, unspecified: Secondary | ICD-10-CM | POA: Diagnosis not present

## 2014-06-21 DIAGNOSIS — M5412 Radiculopathy, cervical region: Secondary | ICD-10-CM | POA: Diagnosis not present

## 2014-06-21 DIAGNOSIS — F419 Anxiety disorder, unspecified: Secondary | ICD-10-CM | POA: Diagnosis not present

## 2014-06-21 DIAGNOSIS — M79622 Pain in left upper arm: Secondary | ICD-10-CM | POA: Diagnosis present

## 2014-06-21 DIAGNOSIS — Z7982 Long term (current) use of aspirin: Secondary | ICD-10-CM | POA: Diagnosis not present

## 2014-06-21 LAB — CBC WITH DIFFERENTIAL/PLATELET
Basophils Absolute: 0 10*3/uL (ref 0.0–0.1)
Basophils Relative: 1 % (ref 0–1)
Eosinophils Absolute: 0.3 10*3/uL (ref 0.0–0.7)
Eosinophils Relative: 4 % (ref 0–5)
HCT: 42 % (ref 36.0–46.0)
Hemoglobin: 13.7 g/dL (ref 12.0–15.0)
Lymphocytes Relative: 8 % — ABNORMAL LOW (ref 12–46)
Lymphs Abs: 0.7 10*3/uL (ref 0.7–4.0)
MCH: 29 pg (ref 26.0–34.0)
MCHC: 32.6 g/dL (ref 30.0–36.0)
MCV: 88.8 fL (ref 78.0–100.0)
Monocytes Absolute: 0.9 10*3/uL (ref 0.1–1.0)
Monocytes Relative: 10 % (ref 3–12)
Neutro Abs: 6.6 10*3/uL (ref 1.7–7.7)
Neutrophils Relative %: 77 % (ref 43–77)
Platelets: 291 10*3/uL (ref 150–400)
RBC: 4.73 MIL/uL (ref 3.87–5.11)
RDW: 13.3 % (ref 11.5–15.5)
WBC: 8.5 10*3/uL (ref 4.0–10.5)

## 2014-06-21 LAB — BASIC METABOLIC PANEL
Anion gap: 9 (ref 5–15)
BUN: 12 mg/dL (ref 6–20)
CO2: 31 mmol/L (ref 22–32)
Calcium: 9.7 mg/dL (ref 8.9–10.3)
Chloride: 99 mmol/L — ABNORMAL LOW (ref 101–111)
Creatinine, Ser: 0.87 mg/dL (ref 0.44–1.00)
GFR calc Af Amer: >60 mL/min (ref >60)
GFR calc non Af Amer: >60 mL/min (ref >60)
Glucose, Bld: 120 mg/dL — ABNORMAL HIGH (ref 65–99)
Potassium: 3.3 mmol/L — ABNORMAL LOW (ref 3.5–5.1)
Sodium: 139 mmol/L (ref 135–145)

## 2014-06-21 LAB — CK: Total CK: 90 U/L (ref 38–234)

## 2014-06-21 LAB — TROPONIN I: Troponin I: <0.03 ng/mL (ref <0.031)

## 2014-06-21 MED ORDER — HYDROCODONE-ACETAMINOPHEN 5-325 MG PO TABS: 1.0000 | ORAL_TABLET | ORAL | Status: DC | PRN

## 2014-06-21 MED ORDER — IBUPROFEN 800 MG PO TABS: 800.0000 mg | ORAL_TABLET | Freq: Three times a day (TID) | ORAL | Status: DC

## 2014-06-21 NOTE — Discharge Instructions (Signed)
Cervical Radiculopathy Take the pain medication and anti-inflammatory's as prescribed. Follow-up for a ultrasound of your arm to make sure there is no blood clot. Return to the ED if you develop weakness, numbness, tingling or any other concerns. Cervical radiculopathy happens when a nerve in the neck is pinched or bruised by a slipped (herniated) disk or by arthritic changes in the bones of the cervical spine. This can occur due to an injury or as part of the normal aging process. Pressure on the cervical nerves can cause pain or numbness that runs from your neck all the way down into your arm and fingers. CAUSES  There are many possible causes, including:  Injury.  Muscle tightness in the neck from overuse.  Swollen, painful joints (arthritis).  Breakdown or degeneration in the bones and joints of the spine (spondylosis) due to aging.  Bone spurs that may develop near the cervical nerves. SYMPTOMS  Symptoms include pain, weakness, or numbness in the affected arm and hand. Pain can be severe or irritating. Symptoms may be worse when extending or turning the neck. DIAGNOSIS  Your caregiver will ask about your symptoms and do a physical exam. He or she may test your strength and reflexes. X-rays, CT scans, and MRI scans may be needed in cases of injury or if the symptoms do not go away after a period of time. Electromyography (EMG) or nerve conduction testing may be done to study how your nerves and muscles are working. TREATMENT  Your caregiver may recommend certain exercises to help relieve your symptoms. Cervical radiculopathy can, and often does, get better with time and treatment. If your problems continue, treatment options may include:  Wearing a soft collar for short periods of time.  Physical therapy to strengthen the neck muscles.  Medicines, such as nonsteroidal anti-inflammatory drugs (NSAIDs), oral corticosteroids, or spinal injections.  Surgery. Different types of surgery may  be done depending on the cause of your problems. HOME CARE INSTRUCTIONS   Put ice on the affected area.  Put ice in a plastic bag.  Place a towel between your skin and the bag.  Leave the ice on for 15-20 minutes, 03-04 times a day or as directed by your caregiver.  If ice does not help, you can try using heat. Take a warm shower or bath, or use a hot water bottle as directed by your caregiver.  You may try a gentle neck and shoulder massage.  Use a flat pillow when you sleep.  Only take over-the-counter or prescription medicines for pain, discomfort, or fever as directed by your caregiver.  If physical therapy was prescribed, follow your caregiver's directions.  If a soft collar was prescribed, use it as directed. SEEK IMMEDIATE MEDICAL CARE IF:   Your pain gets much worse and cannot be controlled with medicines.  You have weakness or numbness in your hand, arm, face, or leg.  You have a high fever or a stiff, rigid neck.  You lose bowel or bladder control (incontinence).  You have trouble with walking, balance, or speaking. MAKE SURE YOU:   Understand these instructions.  Will watch your condition.  Will get help right away if you are not doing well or get worse. Document Released: 09/27/2000 Document Revised: 03/27/2011 Document Reviewed: 08/16/2010 [facility] Patient Information 2015 [place], [place]. This information is not intended to replace advice given to you by your health care provider. Make sure you discuss any questions you have with your health care provider.

## 2014-06-21 NOTE — ED Notes (Signed)
Patient transported to X-ray 

## 2014-06-21 NOTE — ED Notes (Signed)
Pt ambulating independently w/ steady gait on d/c in no acute distress, A&Ox4. D/c instructions reviewed w/ pt - pt denies any further questions or concerns at present. Rx given x2  

## 2014-06-21 NOTE — ED Notes (Signed)
Pt reports pain and swelling in her left forearm, worse at night, hx of arthritis

## 2014-06-21 NOTE — ED Provider Notes (Signed)
CSN: [id number]     Arrival date & time 06/21/14  1706 History   First MD Initiated Contact with Patient 06/21/14 1939     Chief Complaint  Patient presents with  . Arm Pain  . Arm Swelling     (Consider location/radiation/quality/duration/timing/severity/associated sxs/prior Treatment) HPI Comments: Patient with 3 day history of pain and swelling in her left forearm and upper arm. Worse at night. She denies any falls or trauma. She does have a history of arthritis. She denies any chest pain or shortness of breath. She denies any difficulty breathing or swallowing. No cough or fever. She is scheduled to have a breast biopsy tomorrow and is being worked up for possibility of breast cancer. She also has history of diabetes and hypertension. She has known cervical spondylosis. She denies any weakness in the arm. There is occasionally numbness and tingling. There is no bowel or bladder incontinence. There is no abdominal pain or vomiting. She's been using tramadol with some relief.   Past Medical History  Diagnosis Date  . Diabetes mellitus   . Hypertension   . Hyperlipidemia   . Low back pain   . Cervical spondylosis   . Obesity   . Depression     followed by [name]; no longer of depakote, seroquel  . Chronic serous otitis media   . Foot drop   . Constipation   . Paraumbilical hernia   . Asthma   . COPD (chronic obstructive pulmonary disease)   . Anxiety   . GERD (gastroesophageal reflux disease)    Past Surgical History  Procedure Laterality Date  . Cholecystectomy    . Hernia repair  1999  . Foot surgery    . Colonoscopy    . Abdominal hysterectomy     Family History  Problem Relation Age of Onset  . Colon cancer Neg Hx    History  Substance Use Topics  . Smoking status: Former Smoker    Types: Cigarettes    Quit date: 01/17/1996  . Smokeless tobacco: Never Used  . Alcohol Use: No   OB History    No data available     Review of Systems  Constitutional:  Negative for fever, activity change and appetite change.  HENT: Negative for congestion and rhinorrhea.   Respiratory: Negative for cough, chest tightness and shortness of breath.   Cardiovascular: Negative for chest pain.  Gastrointestinal: Negative for nausea, vomiting and abdominal pain.  Genitourinary: Negative for dysuria, hematuria, vaginal bleeding and vaginal discharge.  Musculoskeletal: Positive for myalgias, arthralgias and neck pain. Negative for back pain.  Skin: Negative for rash.  Neurological: Negative for dizziness, weakness and headaches.  A complete 10 system review of systems was obtained and all systems are negative except as noted in the HPI and PMH.      Allergies  Review of patient's allergies indicates no known allergies.  Home Medications   Prior to Admission medications   Medication Sig Start Date End Date Taking? Authorizing Provider  acetaminophen (TYLENOL) 500 MG tablet Take 1 tablet (500 mg total) by mouth every 4 (four) hours as needed. Do not take more than 6 times per day 11/21/13  Yes Cresenciano Genre, MD  albuterol (PROVENTIL HFA;VENTOLIN HFA) 108 (90 BASE) MCG/ACT inhaler Inhale 2 puffs into the lungs every 6 (six) hours as needed for wheezing. 04/29/14 04/29/15 Yes Sid Falcon, MD  aspirin (ASPIR-81) 81 MG EC tablet Take 81 mg by mouth daily.     Yes Historical Provider,  MD  atorvastatin (LIPITOR) 40 MG tablet take 1 tablet by mouth once daily 04/24/14  Yes Sid Falcon, MD  benazepril (LOTENSIN) 20 MG tablet Take 1 tablet (20 mg total) by mouth daily. 11/27/13  Yes Sid Falcon, MD  Blood Glucose Monitoring Suppl (ONE TOUCH ULTRA MINI) W/DEVICE KIT Use to check blood sugars once daily. Dx code: 250.00. 04/17/12  Yes Sid Falcon, MD  Cholecalciferol (VITAMIN D3) 1000 UNITS CAPS Take 2 tablets by mouth daily.     Yes Historical Provider, MD  DEXILANT 60 MG capsule Take 60 mg by mouth daily.  12/04/13  Yes Historical Provider, MD  divalproex (DEPAKOTE  ER) 500 MG 24 hr tablet Take 500 mg by mouth at bedtime.   Yes Historical Provider, MD  fluticasone (FLONASE) 50 MCG/ACT nasal spray instill 1 spray into each nostril once daily 04/28/14  Yes Sid Falcon, MD  Fluticasone-Salmeterol (ADVAIR DISKUS) 100-50 MCG/DOSE AEPB Inhale 1 puff into the lungs 2 (two) times daily. 04/29/14 04/29/15 Yes Sid Falcon, MD  gabapentin (NEURONTIN) 800 MG tablet Take 800 mg by mouth at bedtime.  11/27/13  Yes Historical Provider, MD  hydrochlorothiazide (HYDRODIURIL) 25 MG tablet Take 1 tablet (25 mg total) by mouth daily. 04/29/14  Yes Sid Falcon, MD  imipramine (TOFRANIL) 50 MG tablet Take 50 mg by mouth daily.  12/04/13  Yes Historical Provider, MD  latanoprost (XALATAN) 0.005 % ophthalmic solution Place 1 drop into both eyes at bedtime.  12/22/11  Yes Historical Provider, MD  loratadine (CLARITIN) 10 MG tablet take 1 tablet by mouth once daily 06/08/14  Yes Sid Falcon, MD  ONE [facility] ULTRA TEST test strip TEST once daily 03/06/14  Yes Nischal Narendra, MD  Ranitidine HCl (ACID CONTROL PO) Take 1 tablet by mouth daily.   Yes Historical Provider, MD  traMADol (ULTRAM) 50 MG tablet take 1 tablet by mouth every 6 hours if needed for pain 12/02/13  Yes Sid Falcon, MD  triamcinolone cream (KENALOG) 0.1 % Apply topically 2 (two) times daily. 04/29/14  Yes Sid Falcon, MD  zolpidem (AMBIEN) 10 MG tablet Take 10 mg by mouth at bedtime.  04/25/11  Yes Heinz Knuckles, MD  albuterol (PROVENTIL) (2.5 MG/3ML) 0.083% nebulizer solution Take 3 mLs (2.5 mg total) by nebulization every 6 (six) hours as needed for wheezing. 11/21/13   Cresenciano Genre, MD  divalproex (DEPAKOTE ER) 250 MG 24 hr tablet Take 4 tablets (1,000 mg total) by mouth at bedtime. 04/25/11 06/17/14  Heinz Knuckles, MD  HYDROcodone-acetaminophen (NORCO/VICODIN) 5-325 MG per tablet Take 1 tablet by mouth every 4 (four) hours as needed. 06/21/14   Ezequiel Essex, MD  ibuprofen (ADVIL,MOTRIN) 800 MG tablet  Take 1 tablet (800 mg total) by mouth 3 (three) times daily. 06/21/14   Ezequiel Essex, MD  ipratropium (ATROVENT) 0.02 % nebulizer solution Take 2.5 mLs (0.5 mg total) by nebulization every 4 (four) hours as needed for wheezing. 04/29/14 12/05/15  Sid Falcon, MD  ONE TOUCH LANCETS MISC Use to check blood sugars once daily. Dx code: 250.00. Patient not taking: Reported on 06/21/2014 04/17/12   Sid Falcon, MD   BP 140/73 mmHg  Pulse 80  Temp(Src) 98.2 F (36.8 C) (Oral)  Resp 17  Ht 5' (1.524 m)  Wt 182 lb (82.555 kg)  BMI 35.54 kg/m2  SpO2 96%  LMP 04/29/1966 Physical Exam  Constitutional: She is oriented to person, place, and time. She appears well-developed and  well-nourished. No distress.  HENT:  Head: Normocephalic and atraumatic.  Mouth/Throat: Oropharynx is clear and moist. No oropharyngeal exudate.  Eyes: Conjunctivae and EOM are normal. Pupils are equal, round, and reactive to light.  Neck: Normal range of motion. Neck supple.  No meningismus.  Cardiovascular: Normal rate, regular rhythm, normal heart sounds and intact distal pulses.   No murmur heard. Pulmonary/Chest: Effort normal and breath sounds normal. No respiratory distress.  Abdominal: Soft. There is no tenderness. There is no rebound and no guarding.  Musculoskeletal: Normal range of motion. She exhibits no edema or tenderness.  Paraspinal left sided cervical tenderness, full range of motion of left shoulder, elbow, wrist and hand. Intact radial pulse. Grip strengths are equal.  No appreciable swelling compared to right side.  Neurological: She is alert and oriented to person, place, and time. No cranial nerve deficit. She exhibits normal muscle tone. Coordination normal.  No ataxia on finger to nose bilaterally. No pronator drift. 5/5 strength throughout. CN 2-12 intact. Negative Romberg. Equal grip strength. Sensation intact. Gait is normal.   Skin: Skin is warm.  Psychiatric: She has a normal mood and affect.  Her behavior is normal.  Nursing note and vitals reviewed.   ED Course  Procedures (including critical care time) Labs Review Labs Reviewed  CBC WITH DIFFERENTIAL/PLATELET - Abnormal; Notable for the following:    Lymphocytes Relative 8 (*)    All other components within normal limits  BASIC METABOLIC PANEL - Abnormal; Notable for the following:    Potassium 3.3 (*)    Chloride 99 (*)    Glucose, Bld 120 (*)    All other components within normal limits  CK  TROPONIN I    Imaging Review Dg Cervical Spine Complete  06/21/2014   CLINICAL DATA:  [place] 1 week ago and hit head. Neck pain and left arm pain and numbness.  EXAM: CERVICAL SPINE  4+ VIEWS  COMPARISON:  MRI cervical spine 10/18/2007.  FINDINGS: Advanced degenerative cervical spondylosis with multilevel disc disease and facet disease. The overall alignment is maintained. No acute cervical spine fracture is identified. No abnormal prevertebral soft tissue swelling. The C1-2 articulations are maintained. Moderate degenerative changes. Uncinate spurring changes and facet disease contributing to multilevel bony foraminal narrowing most notably at C4-5, C5-6 and C6-7. The lung apices are clear.  IMPRESSION: Degenerative cervical spondylosis with multilevel disc disease and facet disease. No acute cervical spine fracture.  Uncinate spurring changes and facet disease contributing to bilateral foraminal stenosis most notably at C4-5, C5-6 and C6-7.   Electronically Signed   By: Marijo Sanes M.D.   On: 06/21/2014 20:54     EKG Interpretation   Date/Time:  Sunday June 21 2014 20:09:57 EDT Ventricular Rate:  68 PR Interval:  167 QRS Duration: 97 QT Interval:  394 QTC Calculation: 419 R Axis:   5 Text Interpretation:  Sinus rhythm Low voltage, precordial leads Consider  anterior infarct Baseline wander in lead(s) V1 No significant change was  found Confirmed by Wyvonnia Dusky  MD, Zyniah Ferraiolo [phone]) on 06/21/2014 8:45:43 PM     . MDM   Final  diagnoses:  Cervical radiculopathy   Three-day history of left arm pain. No chest pain or shortness of breath. EKG nonischemic. Neurovascularly intact. No grip strength weakness.  Concern for possible cervical radiculopathy. No swelling to suggest DVT.  Bone spurring on C spine Xray as above. EKG nsr.  Suspect cervical radiculopathy. No grip strength weakness. Electrolytes normal. Patient declines steroids given her DM  history. SHe is scheduled for a breast procedure tomorrow. She is also concerned about DVT but this seems unlikely based on exam.  Will schedule outpatient [place]. Treat with pain control and antiinflammatories.  Follow up with PCP.    Ezequiel Essex, MD 06/22/14 [phone]

## 2014-06-22 ENCOUNTER — Ambulatory Visit (HOSPITAL_COMMUNITY): Admission: RE | Admit: 2014-06-22 | Payer: Medicare HMO | Source: Ambulatory Visit

## 2014-06-22 NOTE — H&P (Addendum)
Mariah Henderson Location: [facility] Patient #: [id number] DOB: 11-Aug-1950 Divorced / Language: English / Race: Black or African American Female  History of Present Illness Patient words: check breast.  The patient is a 64 year old female who is referred by [place] for a new sclerosing lesion in the left breast. She has not ever had issues with her breasts before. She had a new area of microcalcification detected in the upper outer quadrant of the left breast. She has not had any breast pain. She does have a sister who was recently diagnosed with breast cancer at a similar age. She denies any other familial cancer history. 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   Diagnostic Studies History  Mammogram 1-3 years ago  Allergies  No Known Drug Allergies   Medication History  TraMADol HCl (50MG  Tablet, Oral) Active. Zolpidem Tartrate (5MG  Tablet, Oral) Active. Advair Diskus (100-50MCG/DOSE Aero Pow Br Act, Inhalation) Active. Atorvastatin Calcium (40MG  Tablet, Oral) Active. Benazepril HCl (20MG  Tablet, Oral) Active. Dexilant (60MG  Capsule DR, Oral) Active. Fluticasone Propionate (50MCG/ACT Suspension, Nasal) Active. Gabapentin (800MG  Tablet, Oral) Active. Hydrochlorothiazide (25MG  Tablet, Oral) Active. Ibuprofen (600MG  Tablet, Oral) Active. Ipratropium Bromide (0.02% Solution, Inhalation) Active. Loratadine (10MG  Tablet, Oral) Active. MoviPrep (100GM For Solution, Oral) Active. Omeprazole (20MG  Capsule DR, Oral) Active. OneTouch Ultra Blue (In Vitro) Active. ProAir HFA (108 (90 Base)MCG/ACT Aerosol Soln, Inhalation) Active. Triamcinolone Acetonide (0.1% Cream, External) Active. Medications Reconciled  Pregnancy / Birth History Gravida 2 Para 1  Review of Systems   Skin Present- Rash. Not Present- Change in Wart/Mole, Dryness, Hives, Jaundice, New Lesions,  Non-Healing Wounds and Ulcer. HEENT Present- Hearing Loss and Seasonal Allergies. Not Present- Earache, Hoarseness, Nose Bleed, Oral Ulcers, Ringing in the Ears, Sinus Pain, Sore Throat, Visual Disturbances, Wears glasses/contact lenses and Yellow Eyes. Respiratory Present- Wheezing. Not Present- Bloody sputum, Chronic Cough, Difficulty Breathing and Snoring. Breast Not Present- Breast Mass, Breast Pain, Nipple Discharge and Skin Changes. Cardiovascular Present- Swelling of Extremities. Not Present- Chest Pain, Difficulty Breathing Lying Down, Leg Cramps, Palpitations, Rapid Heart Rate and Shortness of Breath. Gastrointestinal Present- Indigestion. Not Present- Abdominal Pain, Bloating, Bloody Stool, Change in Bowel Habits, Chronic diarrhea, Constipation, Difficulty Swallowing, Excessive gas, Gets full quickly at meals, Hemorrhoids, Nausea, Rectal Pain and Vomiting. Female Genitourinary Not Present- Frequency, Nocturia, Painful Urination, Pelvic Pain and Urgency. Musculoskeletal Present- Joint Stiffness. Not Present- Back Pain, Joint Pain, Muscle Pain, Muscle Weakness and Swelling of Extremities.   Vitals Weight: 183 lb Height: 60in Body Surface Area: 1.87 m Body Mass Index: 35.74 kg/m Temp.: 98.80F(Oral)  Pulse: 77 (Regular)  Resp.: 17 (Unlabored)  BP: 134/78 (Sitting, Left Arm, Standard)    Physical Exam  General Mental Status-Alert. General Appearance-Consistent with stated age. Hydration-Well hydrated. Voice-Normal.  Head and Neck Head-normocephalic, atraumatic with no lesions or palpable masses. Trachea-midline. Thyroid Gland Characteristics - normal size and consistency.  Eye Eyeball - Bilateral-Extraocular movements intact. Sclera/Conjunctiva - Bilateral-No scleral icterus.  Chest and Lung Exam Chest and lung exam reveals -quiet, even and easy respiratory effort with no use of accessory muscles and on auscultation, normal breath sounds,  no adventitious sounds and normal vocal resonance. Inspection Chest Wall - Normal. Back - normal.  Breast Note: Fatty replaced breasts bilaterally. Ptosis bilaterally. Breasts are symmetric. There are no palpable masses, skin dimpling, nipple retraction, or axillary lymphadenopathy. She has no nipple discharge.   Cardiovascular Cardiovascular examination reveals -normal heart sounds, regular rate and rhythm with  no murmurs and normal pedal pulses bilaterally.  Abdomen Inspection Inspection of the abdomen reveals - No Hernias. Palpation/Percussion Palpation and Percussion of the abdomen reveal - Soft, Non Tender, No Rebound tenderness, No Rigidity (guarding) and No hepatosplenomegaly. Auscultation Auscultation of the abdomen reveals - Bowel sounds normal.  Neurologic Neurologic evaluation reveals -alert and oriented x 3 with no impairment of recent or remote memory. Mental Status-Normal.  Musculoskeletal Global Assessment -Note: no gross deformities.  Normal Exam - Left-Upper Extremity Strength Normal and Lower Extremity Strength Normal. Normal Exam - Right-Upper Extremity Strength Normal and Lower Extremity Strength Normal.  Lymphatic Head & Neck  General Head & Neck Lymphatics: Bilateral - Description - Normal. Axillary  General Axillary Region: Bilateral - Description - Normal. Tenderness - Non Tender. Femoral & Inguinal  Generalized Femoral & Inguinal Lymphatics: Bilateral - Description - No Generalized lymphadenopathy.    Assessment & Plan  ABNORMAL MAMMOGRAM OF LEFT BREAST (793.80  R92.8) Impression: Patient will need a CT localized excisional biopsy of the left breast for adequate sampling. I reviewed the process of this with the patient. I also reviewed the risks of the procedure.  The surgical procedure was described to the patient. I discussed the incision type and location and that we would need radiology involved on with a wire or seed marker  and/or sentinel node.  The risks and benefits of the procedure were described to the patient and she wishes to proceed.  We discussed the risks bleeding, infection, damage to other structures, need for further procedures/surgeries. We discussed the risk of seroma. The patient was advised if the area in the breast in cancer, we may need to go back to surgery for additional tissue to obtain negative margins or for a lymph node biopsy. The patient was advised that these are the most common complications, but that others can occur as well. They were advised against taking aspirin or other anti-inflammatory agents/blood thinners the week before surgery. Current Plans  Schedule for Surgery Pt Education - [facility] Breast Biopsy Instructions (FLB): discussed with patient and provided information.   Signed by Stark Klein, MD

## 2014-06-23 ENCOUNTER — Ambulatory Visit (HOSPITAL_BASED_OUTPATIENT_CLINIC_OR_DEPARTMENT_OTHER)
Admission: RE | Admit: 2014-06-23 | Discharge: 2014-06-23 | Disposition: A | Discharge status: Home / Self Care | Payer: Medicare HMO | Source: Ambulatory Visit | Attending: General Surgery | Admitting: General Surgery

## 2014-06-23 ENCOUNTER — Encounter (HOSPITAL_BASED_OUTPATIENT_CLINIC_OR_DEPARTMENT_OTHER): Payer: Self-pay

## 2014-06-23 ENCOUNTER — Encounter (HOSPITAL_BASED_OUTPATIENT_CLINIC_OR_DEPARTMENT_OTHER)
Admission: RE | Disposition: A | Discharge status: Home / Self Care | Payer: Self-pay | Source: Ambulatory Visit | Attending: General Surgery

## 2014-06-23 ENCOUNTER — Ambulatory Visit (HOSPITAL_BASED_OUTPATIENT_CLINIC_OR_DEPARTMENT_OTHER): Payer: Medicare HMO | Admitting: Anesthesiology

## 2014-06-23 DIAGNOSIS — M1990 Unspecified osteoarthritis, unspecified site: Secondary | ICD-10-CM | POA: Insufficient documentation

## 2014-06-23 DIAGNOSIS — J449 Chronic obstructive pulmonary disease, unspecified: Secondary | ICD-10-CM | POA: Insufficient documentation

## 2014-06-23 DIAGNOSIS — R928 Other abnormal and inconclusive findings on diagnostic imaging of breast: Secondary | ICD-10-CM | POA: Insufficient documentation

## 2014-06-23 DIAGNOSIS — E119 Type 2 diabetes mellitus without complications: Secondary | ICD-10-CM | POA: Insufficient documentation

## 2014-06-23 DIAGNOSIS — F319 Bipolar disorder, unspecified: Secondary | ICD-10-CM

## 2014-06-23 DIAGNOSIS — I10 Essential (primary) hypertension: Secondary | ICD-10-CM | POA: Insufficient documentation

## 2014-06-23 HISTORY — DX: Gastro-esophageal reflux disease without esophagitis: K21.9

## 2014-06-23 HISTORY — DX: Chronic obstructive pulmonary disease, unspecified: J44.9

## 2014-06-23 HISTORY — DX: Unspecified asthma, uncomplicated: J45.909

## 2014-06-23 HISTORY — PX: RADIOACTIVE SEED GUIDED EXCISIONAL BREAST BIOPSY: SHX6490

## 2014-06-23 HISTORY — DX: Anxiety disorder, unspecified: F41.9

## 2014-06-23 LAB — GLUCOSE, CAPILLARY: Glucose-Capillary: 110 mg/dL — ABNORMAL HIGH (ref 65–99)

## 2014-06-23 SURGERY — RADIOACTIVE SEED GUIDED BREAST BIOPSY
Anesthesia: General | Site: Breast | Laterality: Left

## 2014-06-23 MED ORDER — FENTANYL CITRATE (PF) 100 MCG/2ML IJ SOLN
INTRAMUSCULAR | Status: AC
  Filled 2014-06-23: qty 2

## 2014-06-23 MED ORDER — MIDAZOLAM HCL 5 MG/5ML IJ SOLN
INTRAMUSCULAR | Status: DC | PRN
  Administered 2014-06-23: 2 mg via INTRAVENOUS

## 2014-06-23 MED ORDER — OXYCODONE HCL 5 MG/5ML PO SOLN: 5.0000 mg | Freq: Once | ORAL | Status: DC | PRN

## 2014-06-23 MED ORDER — SODIUM CHLORIDE 0.9 % IJ SOLN: 3.0000 mL | INTRAMUSCULAR | Status: DC | PRN

## 2014-06-23 MED ORDER — MIDAZOLAM HCL 2 MG/2ML IJ SOLN
INTRAMUSCULAR | Status: AC
  Filled 2014-06-23: qty 2

## 2014-06-23 MED ORDER — OXYCODONE HCL 5 MG PO TABS: 5.0000 mg | ORAL_TABLET | Freq: Once | ORAL | Status: DC | PRN

## 2014-06-23 MED ORDER — LACTATED RINGERS IV SOLN
INTRAVENOUS | Status: DC
  Administered 2014-06-23 (×2): via INTRAVENOUS

## 2014-06-23 MED ORDER — CEFAZOLIN SODIUM-DEXTROSE 2-3 GM-% IV SOLR
2.0000 g | INTRAVENOUS | Status: AC
  Administered 2014-06-23: 2 g via INTRAVENOUS

## 2014-06-23 MED ORDER — ONDANSETRON HCL 4 MG/2ML IJ SOLN
INTRAMUSCULAR | Status: DC | PRN
  Administered 2014-06-23: 4 mg via INTRAVENOUS

## 2014-06-23 MED ORDER — BUPIVACAINE-EPINEPHRINE 0.5% -1:200000 IJ SOLN
INTRAMUSCULAR | Status: DC | PRN
  Administered 2014-06-23: 20 mL

## 2014-06-23 MED ORDER — ONDANSETRON HCL 4 MG/2ML IJ SOLN: 4.0000 mg | Freq: Four times a day (QID) | INTRAMUSCULAR | Status: DC | PRN

## 2014-06-23 MED ORDER — ACETAMINOPHEN 325 MG PO TABS: 650.0000 mg | ORAL_TABLET | ORAL | Status: DC | PRN

## 2014-06-23 MED ORDER — FENTANYL CITRATE (PF) 100 MCG/2ML IJ SOLN
INTRAMUSCULAR | Status: DC | PRN
  Administered 2014-06-23 (×2): 50 ug via INTRAVENOUS

## 2014-06-23 MED ORDER — CEFAZOLIN SODIUM-DEXTROSE 2-3 GM-% IV SOLR
INTRAVENOUS | Status: AC
  Filled 2014-06-23: qty 50

## 2014-06-23 MED ORDER — LIDOCAINE HCL (CARDIAC) 20 MG/ML IV SOLN
INTRAVENOUS | Status: DC | PRN
  Administered 2014-06-23: 50 mg via INTRAVENOUS

## 2014-06-23 MED ORDER — DEXAMETHASONE SODIUM PHOSPHATE 4 MG/ML IJ SOLN
INTRAMUSCULAR | Status: DC | PRN
  Administered 2014-06-23: 10 mg via INTRAVENOUS

## 2014-06-23 MED ORDER — SODIUM CHLORIDE 0.9 % IJ SOLN: 3.0000 mL | Freq: Two times a day (BID) | INTRAMUSCULAR | Status: DC

## 2014-06-23 MED ORDER — FENTANYL CITRATE (PF) 100 MCG/2ML IJ SOLN
25.0000 ug | INTRAMUSCULAR | Status: DC | PRN
  Administered 2014-06-23: 50 ug via INTRAVENOUS
  Administered 2014-06-23: 25 ug via INTRAVENOUS

## 2014-06-23 MED ORDER — OXYCODONE-ACETAMINOPHEN 5-325 MG PO TABS: 1.0000 | ORAL_TABLET | ORAL | Status: DC | PRN

## 2014-06-23 MED ORDER — ACETAMINOPHEN 650 MG RE SUPP: 650.0000 mg | RECTAL | Status: DC | PRN

## 2014-06-23 MED ORDER — FENTANYL CITRATE (PF) 100 MCG/2ML IJ SOLN
INTRAMUSCULAR | Status: AC
  Filled 2014-06-23: qty 4

## 2014-06-23 MED ORDER — OXYCODONE HCL 5 MG PO TABS: 5.0000 mg | ORAL_TABLET | ORAL | Status: DC | PRN

## 2014-06-23 MED ORDER — SODIUM CHLORIDE 0.9 % IV SOLN: 250.0000 mL | INTRAVENOUS | Status: DC | PRN

## 2014-06-23 MED ORDER — PROPOFOL 10 MG/ML IV BOLUS
INTRAVENOUS | Status: DC | PRN
  Administered 2014-06-23: 150 mg via INTRAVENOUS

## 2014-06-23 SURGICAL SUPPLY — 55 items
BLADE HEX COATED 2.75 (ELECTRODE) ×2 IMPLANT
BLADE SURG 10 STRL SS (BLADE) IMPLANT
BLADE SURG 15 STRL LF DISP TIS (BLADE) ×1 IMPLANT
BLADE SURG 15 STRL SS (BLADE) ×2
CANISTER SUC SOCK COL 7IN (MISCELLANEOUS) IMPLANT
CANISTER SUCT 1200ML W/VALVE (MISCELLANEOUS) ×2 IMPLANT
CHLORAPREP W/TINT 26ML (MISCELLANEOUS) ×2 IMPLANT
CLIP TI LARGE 6 (CLIP) IMPLANT
COVER BACK TABLE 60X90IN (DRAPES) ×2 IMPLANT
COVER MAYO STAND STRL (DRAPES) ×2 IMPLANT
COVER PROBE W GEL 5X96 (DRAPES) ×2 IMPLANT
DECANTER SPIKE VIAL GLASS SM (MISCELLANEOUS) IMPLANT
DEVICE DUBIN W/COMP PLATE 8390 (MISCELLANEOUS) ×2 IMPLANT
DRAPE LAPAROTOMY 100X72 PEDS (DRAPES) ×1 IMPLANT
DRAPE UNIVERSAL PACK (DRAPES) ×1 IMPLANT
DRAPE UTILITY XL STRL (DRAPES) ×2 IMPLANT
ELECT REM PT RETURN 9FT ADLT (ELECTROSURGICAL) ×2
ELECTRODE REM PT RTRN 9FT ADLT (ELECTROSURGICAL) ×1 IMPLANT
GAUZE SPONGE 4X4 12PLY STRL (GAUZE/BANDAGES/DRESSINGS) ×4 IMPLANT
GLOVE BIO SURGEON STRL SZ 6 (GLOVE) ×2 IMPLANT
GLOVE BIO SURGEON STRL SZ7 (GLOVE) ×1 IMPLANT
GLOVE BIOGEL PI IND STRL 6.5 (GLOVE) ×1 IMPLANT
GLOVE BIOGEL PI IND STRL 7.0 (GLOVE) IMPLANT
GLOVE BIOGEL PI IND STRL 7.5 (GLOVE) IMPLANT
GLOVE BIOGEL PI INDICATOR 6.5 (GLOVE) ×1
GLOVE BIOGEL PI INDICATOR 7.0 (GLOVE) ×1
GLOVE BIOGEL PI INDICATOR 7.5 (GLOVE) ×1
GLOVE ECLIPSE 6.5 STRL STRAW (GLOVE) ×1 IMPLANT
GOWN STRL REUS W/ TWL LRG LVL3 (GOWN DISPOSABLE) ×1 IMPLANT
GOWN STRL REUS W/TWL 2XL LVL3 (GOWN DISPOSABLE) ×2 IMPLANT
GOWN STRL REUS W/TWL LRG LVL3 (GOWN DISPOSABLE) ×4
KIT MARKER MARGIN INK (KITS) ×2 IMPLANT
LIQUID BAND (GAUZE/BANDAGES/DRESSINGS) ×2 IMPLANT
NDL HYPO 25X1 1.5 SAFETY (NEEDLE) ×1 IMPLANT
NEEDLE HYPO 25X1 1.5 SAFETY (NEEDLE) ×2 IMPLANT
NS IRRIG 1000ML POUR BTL (IV SOLUTION) ×2 IMPLANT
PACK BASIN DAY SURGERY FS (CUSTOM PROCEDURE TRAY) ×2 IMPLANT
PENCIL BUTTON HOLSTER BLD 10FT (ELECTRODE) ×2 IMPLANT
SLEEVE SCD COMPRESS KNEE MED (MISCELLANEOUS) ×2 IMPLANT
SPONGE GAUZE 4X4 12PLY STER LF (GAUZE/BANDAGES/DRESSINGS) ×2 IMPLANT
SPONGE LAP 18X18 X RAY DECT (DISPOSABLE) ×2 IMPLANT
STAPLER VISISTAT 35W (STAPLE) IMPLANT
STRIP CLOSURE SKIN 1/2X4 (GAUZE/BANDAGES/DRESSINGS) ×2 IMPLANT
SUT MON AB 4-0 PC3 18 (SUTURE) ×2 IMPLANT
SUT SILK 2 0 SH (SUTURE) IMPLANT
SUT VIC AB 2-0 SH 18 (SUTURE) ×2 IMPLANT
SUT VIC AB 3-0 SH 27 (SUTURE) ×2
SUT VIC AB 3-0 SH 27X BRD (SUTURE) ×1 IMPLANT
SUT VICRYL 3-0 CR8 SH (SUTURE) ×2 IMPLANT
SYR BULB 3OZ (MISCELLANEOUS) ×2 IMPLANT
SYR CONTROL 10ML LL (SYRINGE) ×2 IMPLANT
TOWEL OR 17X24 6PK STRL BLUE (TOWEL DISPOSABLE) ×2 IMPLANT
TOWEL OR NON WOVEN STRL DISP B (DISPOSABLE) ×2 IMPLANT
TUBE CONNECTING 20X1/4 (TUBING) ×2 IMPLANT
YANKAUER SUCT BULB TIP NO VENT (SUCTIONS) ×2 IMPLANT

## 2014-06-23 NOTE — Interval H&P Note (Signed)
History and Physical Interval Note:  06/23/2014 7:34 AM  Mariah Henderson  has presented today for surgery, with the diagnosis of LEFT BREAST ABNORMAL MAMMOGRAM  The various methods of treatment have been discussed with the patient and family. After consideration of risks, benefits and other options for treatment, the patient has consented to  Procedure(s): RADIOACTIVE SEED GUIDED EXCISIONAL BREAST BIOPSY (Left) as a surgical intervention .  The patient's history has been reviewed, patient examined, no change in status, stable for surgery.  I have reviewed the patient's chart and labs.  Questions were answered to the patient's satisfaction.     Marelyn Rouser

## 2014-06-23 NOTE — Anesthesia Procedure Notes (Signed)
Procedure Name: LMA Insertion Date/Time: 06/23/2014 9:07 AM Performed by: Toula Moos L Pre-anesthesia Checklist: Patient identified, Emergency Drugs available, Suction available, Patient being monitored and Timeout performed Patient Re-evaluated:Patient Re-evaluated prior to inductionOxygen Delivery Method: Circle System Utilized Preoxygenation: Pre-oxygenation with 100% oxygen Intubation Type: IV induction Ventilation: Mask ventilation without difficulty LMA: LMA inserted LMA Size: 4.0 Number of attempts: 1 Airway Equipment and Method: Bite block Placement Confirmation: positive ETCO2 Tube secured with: Tape Dental Injury: Teeth and Oropharynx as per pre-operative assessment

## 2014-06-23 NOTE — Anesthesia Postprocedure Evaluation (Signed)
Anesthesia Post Note  Patient: Mariah Henderson  Procedure(s) Performed: Procedure(s) (LRB): RADIOACTIVE SEED GUIDED EXCISIONAL BREAST BIOPSY (Left)  Anesthesia type: General  Patient location: PACU  Post pain: Pain level controlled and Adequate analgesia  Post assessment: Post-op Vital signs reviewed, Patient's Cardiovascular Status Stable, Respiratory Function Stable, Patent Airway and Pain level controlled  Last Vitals:  Filed Vitals:   06/23/14 1030  BP: 132/74  Pulse: 69  Temp:   Resp: 19    Post vital signs: Reviewed and stable  Level of consciousness: awake, alert  and oriented  Complications: No apparent anesthesia complications

## 2014-06-23 NOTE — Anesthesia Preprocedure Evaluation (Signed)
Anesthesia Evaluation  Patient identified by MRN, date of birth, ID band Patient awake    Reviewed: Allergy & Precautions, NPO status , Patient's Chart, lab work & pertinent test results  Airway Mallampati: II   Neck ROM: full    Dental   Pulmonary asthma , COPDformer smoker,  breath sounds clear to auscultation        Cardiovascular hypertension, Rhythm:regular Rate:Normal     Neuro/Psych Anxiety Depression    GI/Hepatic GERD-  ,  Endo/Other  diabetes, Type 2obese  Renal/GU      Musculoskeletal  (+) Arthritis -,   Abdominal   Peds  Hematology   Anesthesia Other Findings   Reproductive/Obstetrics                             Anesthesia Physical Anesthesia Plan  ASA: II  Anesthesia Plan: General   Post-op Pain Management:    Induction: Intravenous  Airway Management Planned: LMA  Additional Equipment:   Intra-op Plan:   Post-operative Plan:   Informed Consent: I have reviewed the patients History and Physical, chart, labs and discussed the procedure including the risks, benefits and alternatives for the proposed anesthesia with the patient or authorized representative who has indicated his/her understanding and acceptance.     Plan Discussed with: CRNA, Anesthesiologist and Surgeon  Anesthesia Plan Comments:         Anesthesia Quick Evaluation

## 2014-06-23 NOTE — Transfer of Care (Signed)
Immediate Anesthesia Transfer of Care Note  Patient: Mariah Henderson  Procedure(s) Performed: Procedure(s): RADIOACTIVE SEED GUIDED EXCISIONAL BREAST BIOPSY (Left)  Patient Location: PACU  Anesthesia Type:General  Level of Consciousness: awake and sedated  Airway & Oxygen Therapy: Patient Spontanous Breathing and Patient connected to face mask oxygen  Post-op Assessment: Report given to RN and Post -op Vital signs reviewed and stable  Post vital signs: Reviewed and stable  Last Vitals:  Filed Vitals:   06/23/14 1009  BP:   Pulse: 75  Temp:   Resp: 19    Complications: No apparent anesthesia complications

## 2014-06-23 NOTE — Discharge Instructions (Addendum)
[facility] °Office Phone Number [phone] ° °BREAST BIOPSY/ PARTIAL MASTECTOMY: POST OP INSTRUCTIONS ° °Always review your discharge instruction sheet given to you by the facility where your surgery was performed. ° °IF YOU HAVE DISABILITY OR FAMILY LEAVE FORMS, YOU MUST BRING THEM TO THE OFFICE FOR PROCESSING.  DO NOT GIVE THEM TO YOUR DOCTOR. ° °1. A prescription for pain medication may be given to you upon discharge.  Take your pain medication as prescribed, if needed.  If narcotic pain medicine is not needed, then you may take acetaminophen (Tylenol) or ibuprofen (Advil) as needed. °2. Take your usually prescribed medications unless otherwise directed °3. If you need a refill on your pain medication, please contact your pharmacy.  They will contact our office to request authorization.  Prescriptions will not be filled after 5pm or on week-ends. °4. You should eat very light the first 24 hours after surgery, such as soup, crackers, pudding, etc.  Resume your normal diet the day after surgery. °5. Most patients will experience some swelling and bruising in the breast.  Ice packs and a good support bra will help.  Swelling and bruising can take several days to resolve.  °6. It is common to experience some constipation if taking pain medication after surgery.  Increasing fluid intake and taking a stool softener will usually help or prevent this problem from occurring.  A mild laxative (Milk of Magnesia or Miralax) should be taken according to package directions if there are no bowel movements after 48 hours. °7. Unless discharge instructions indicate otherwise, you may remove your bandages 48 hours after surgery, and you may shower at that time.  You may have steri-strips (small skin tapes) in place directly over the incision.  These strips should be left on the skin for 7-10 days.   Any sutures or staples will be removed at the office during your follow-up visit. °8. ACTIVITIES:  You may resume  regular daily activities (gradually increasing) beginning the next day.  Wearing a good support bra or sports bra (or the breast binder) minimizes pain and swelling.  You may have sexual intercourse when it is comfortable. °a. You may drive when you no longer are taking prescription pain medication, you can comfortably wear a seatbelt, and you can safely maneuver your car and apply brakes. °b. RETURN TO WORK:  __________1 week_______________ °9. You should see your doctor in the office for a follow-up appointment approximately two weeks after your surgery.  Your doctor’s nurse will typically make your follow-up appointment when she calls you with your pathology report.  Expect your pathology report 2-3 business days after your surgery.  You may call to check if you do not hear from us after three days. ° ° °WHEN TO CALL YOUR DOCTOR: °1. Fever over 101.0 °2. Nausea and/or vomiting. °3. Extreme swelling or bruising. °4. Continued bleeding from incision. °5. Increased pain, redness, or drainage from the incision. ° °The clinic staff is available to answer your questions during regular business hours.  Please don’t hesitate to call and ask to speak to one of the nurses for clinical concerns.  If you have a medical emergency, go to the nearest emergency room or call 911.  A surgeon from [facility] is always on call at the hospital. ° °For further questions, please visit [email or web address]  ° ° °Post Anesthesia Home Care Instructions ° °Activity: °Get plenty of rest for the remainder of the day. A responsible adult should stay with you for 24   hours following the procedure.  °For the next 24 hours, DO NOT: °-Drive a car °-Operate machinery °-Drink alcoholic beverages °-Take any medication unless instructed by your physician °-Make any legal decisions or sign important papers. ° °Meals: °Start with liquid foods such as gelatin or soup. Progress to regular foods as tolerated. Avoid greasy, spicy, heavy  foods. If nausea and/or vomiting occur, drink only clear liquids until the nausea and/or vomiting subsides. Call your physician if vomiting continues. ° °Special Instructions/Symptoms: °Your throat may feel dry or sore from the anesthesia or the breathing tube placed in your throat during surgery. If this causes discomfort, gargle with warm salt water. The discomfort should disappear within 24 hours. ° °If you had a scopolamine patch placed behind your ear for the management of post- operative nausea and/or vomiting: ° °1. The medication in the patch is effective for 72 hours, after which it should be removed.  Wrap patch in a tissue and discard in the trash. Wash hands thoroughly with soap and water. °2. You may remove the patch earlier than 72 hours if you experience unpleasant side effects which may include dry mouth, dizziness or visual disturbances. °3. Avoid touching the patch. Wash your hands with soap and water after contact with the patch. °  ° °

## 2014-06-23 NOTE — Op Note (Signed)
Left Breast Radioactive seed localized excisional biopsy  Indications: This patient presents with history of abnormal mammogram with sclerosing lesion on needle biopsy  Pre-operative Diagnosis: abnormal mammogram  Post-operative Diagnosis: Same  Surgeon: Stark Klein   Anesthesia: General endotracheal anesthesia  ASA Class: 2  Procedure Details  The patient was seen in the Holding Room. The risks, benefits, complications, treatment options, and expected outcomes were discussed with the patient. The possibilities of bleeding, infection, the need for additional procedures, failure to diagnose a condition, and creating a complication requiring transfusion or operation were discussed with the patient. The patient concurred with the proposed plan, giving informed consent.  The site of surgery properly noted/marked. The patient was taken to Operating Room # 8, identified, and the procedure verified as Left Breast seed localized excisional biopsy. A Time Out was held and the above information confirmed.  The left breast and chest were prepped and draped in standard fashion. The lumpectomy was performed by creating an transverse incision over the upper outer quadrant of the breast over the previously placed radioactive seed.  Dissection was carried down to around the point of maximum signal intensity. The cautery was used to perform the dissection.  Hemostasis was achieved with cautery.   The specimen was inked with the margin marker paint kit.    Specimen radiography confirmed inclusion of the the seed.  The background signal in the breast was zero.  An additional inferior specimen was obtained, marked with the margin marker kit, and the clip and lesion were located in this.  The wound was irrigated and closed with 3-0 vicryl in layers and 4-0 monocryl subcuticular suture.      Sterile dressings were applied. At the end of the operation, all sponge, instrument, and needle counts were  correct.  Findings: grossly clear surgical margins and no adenopathy  Estimated Blood Loss:  min         Specimens: left breast excisional biopsy         Complications:  None; patient tolerated the procedure well.         Disposition: PACU - hemodynamically stable.         Condition: stable

## 2014-06-23 NOTE — Interval H&P Note (Signed)
History and Physical Interval Note:  06/23/2014 7:34 AM  Mariah Henderson  has presented today for surgery, with the diagnosis of LEFT BREAST ABNORMAL MAMMOGRAM  The various methods of treatment have been discussed with the patient and family. After consideration of risks, benefits and other options for treatment, the patient has consented to  Procedure(s): RADIOACTIVE SEED GUIDED EXCISIONAL BREAST BIOPSY (Left) as a surgical intervention .  The patient's history has been reviewed, patient examined, no change in status, stable for surgery.  I have reviewed the patient's chart and labs.  Questions were answered to the patient's satisfaction.     Mariah Henderson

## 2014-06-24 NOTE — Progress Notes (Signed)
Quick Note:  No evidence of cancer, please let patient know. ______

## 2014-06-25 ENCOUNTER — Encounter (HOSPITAL_BASED_OUTPATIENT_CLINIC_OR_DEPARTMENT_OTHER): Payer: Self-pay | Admitting: General Surgery

## 2014-07-01 ENCOUNTER — Ambulatory Visit (HOSPITAL_COMMUNITY)
Admission: RE | Admit: 2014-07-01 | Discharge: 2014-07-01 | Disposition: A | Discharge status: Home / Self Care | Payer: Medicare HMO | Source: Ambulatory Visit | Attending: Emergency Medicine | Admitting: Emergency Medicine

## 2014-07-01 ENCOUNTER — Ambulatory Visit (INDEPENDENT_AMBULATORY_CARE_PROVIDER_SITE_OTHER): Payer: Medicare HMO | Admitting: Internal Medicine

## 2014-07-01 VITALS — BP 129/73 | HR 66 | Temp 98.4°F | Wt 189.0 lb

## 2014-07-01 DIAGNOSIS — M79609 Pain in unspecified limb: Secondary | ICD-10-CM | POA: Diagnosis not present

## 2014-07-01 DIAGNOSIS — E785 Hyperlipidemia, unspecified: Secondary | ICD-10-CM | POA: Diagnosis not present

## 2014-07-01 DIAGNOSIS — R200 Anesthesia of skin: Secondary | ICD-10-CM

## 2014-07-01 DIAGNOSIS — N6012 Diffuse cystic mastopathy of left breast: Secondary | ICD-10-CM | POA: Diagnosis not present

## 2014-07-01 DIAGNOSIS — Z23 Encounter for immunization: Secondary | ICD-10-CM | POA: Diagnosis not present

## 2014-07-01 DIAGNOSIS — M79602 Pain in left arm: Secondary | ICD-10-CM | POA: Insufficient documentation

## 2014-07-01 MED ORDER — MELOXICAM 7.5 MG PO TABS: 7.5000 mg | ORAL_TABLET | Freq: Every day | ORAL | Status: DC

## 2014-07-01 NOTE — Patient Instructions (Signed)
1. Please schedule follow up for 4-6 weeks.  Get MRI of the cervical spine.   We will send you to neurosurgery.   2. Please take all medications as previously prescribed with the following changes:  Start taking Mobic 7.5 mg daily for pain and inflammation involving the left arm.   3. If you have worsening of your symptoms or new symptoms arise, please call the clinic ([phone]), or go to the ER immediately if symptoms are severe.  You have done a great job in taking all your medications. Please continue to do this.

## 2014-07-01 NOTE — Progress Notes (Signed)
Subjective:   [name] female   DOB: 06/15/1950 64 y.o.   MRN: [id number]  HPI: [name] is a 64 y.o. female w/ PMHx of HTN, HLD, DM type II, chronic low back pain, , presents to the clinic today for a follow-up visit after being seen in the ED for left arm numbness. She says this numbness started as pain about 3 weeks ago, beginning up in her shoulder and spreading all the way down into her fingers. She says this has been so bothersome to her that she has started to use the hand less. She denies any significant weakness, but the numbness is accompanied by burning and tingling throughout the arm. She denies any other significant neurological symptoms; no bowel or bladder incontinence, no LE weakness, CN deficits, confusion, memory loss, dysphagia, or speech difficulty. She has never had this before. Patient did have an MRI several years ago for neck pain, which suggested some foraminal stenosis and soft discs in the cervical region, as well as some cervical spondylosis. XR of the cervical spine in the ED on 06/21/14 showed spondylosis, disc disease, facet disease, and spurring.   Additionally, patient had an abnormal mammogram recently and went for a biopsy which was read as [facility] w/ calcifications, no findings suggestive of malignancy. Patient was informed of these results.  Past Medical History  Diagnosis Date  . Diabetes mellitus   . Hypertension   . Hyperlipidemia   . Low back pain   . Cervical spondylosis   . Obesity   . Depression     followed by Dr. Baird Henderson; no longer of depakote, seroquel  . Chronic serous otitis media   . Foot drop   . Constipation   . Paraumbilical hernia   . Asthma   . COPD (chronic obstructive pulmonary disease)   . Anxiety   . GERD (gastroesophageal reflux disease)    Current Outpatient Prescriptions  Medication Sig Dispense Refill  . acetaminophen (TYLENOL) 500 MG tablet Take 1 tablet (500 mg total) by mouth every 4 (four) hours  as needed. Do not take more than 6 times per day 120 tablet 0  . albuterol (PROVENTIL HFA;VENTOLIN HFA) 108 (90 BASE) MCG/ACT inhaler Inhale 2 puffs into the lungs every 6 (six) hours as needed for wheezing. 1 Inhaler 6  . albuterol (PROVENTIL) (2.5 MG/3ML) 0.083% nebulizer solution Take 3 mLs (2.5 mg total) by nebulization every 6 (six) hours as needed for wheezing. 75 mL 2  . aspirin (ASPIR-81) 81 MG EC tablet Take 81 mg by mouth daily.      Marland Kitchen atorvastatin (LIPITOR) 40 MG tablet take 1 tablet by mouth once daily 90 tablet 3  . benazepril (LOTENSIN) 20 MG tablet Take 1 tablet (20 mg total) by mouth daily. 90 tablet 3  . Blood Glucose Monitoring Suppl (ONE TOUCH ULTRA MINI) W/DEVICE KIT Use to check blood sugars once daily. Dx code: 250.00. 1 each 0  . Cholecalciferol (VITAMIN D3) 1000 UNITS CAPS Take 2 tablets by mouth daily.      Marland Kitchen DEXILANT 60 MG capsule Take 60 mg by mouth daily.   1  . divalproex (DEPAKOTE ER) 500 MG 24 hr tablet Take 500 mg by mouth at bedtime.    . fluticasone (FLONASE) 50 MCG/ACT nasal spray instill 1 spray into each nostril once daily 16 g 11  . Fluticasone-Salmeterol (ADVAIR DISKUS) 100-50 MCG/DOSE AEPB Inhale 1 puff into the lungs 2 (two) times daily. 60 each 6  . gabapentin (NEURONTIN)  800 MG tablet Take 800 mg by mouth at bedtime.   0  . hydrochlorothiazide (HYDRODIURIL) 25 MG tablet Take 1 tablet (25 mg total) by mouth daily. 90 tablet 3  . HYDROcodone-acetaminophen (NORCO/VICODIN) 5-325 MG per tablet Take 1 tablet by mouth every 4 (four) hours as needed. 10 tablet 0  . ibuprofen (ADVIL,MOTRIN) 800 MG tablet Take 1 tablet (800 mg total) by mouth 3 (three) times daily. 21 tablet 0  . imipramine (TOFRANIL) 50 MG tablet Take 50 mg by mouth daily.     . ipratropium (ATROVENT) 0.02 % nebulizer solution Take 2.5 mLs (0.5 mg total) by nebulization every 4 (four) hours as needed for wheezing. 75 mL 6  . latanoprost (XALATAN) 0.005 % ophthalmic solution Place 1 drop into both  eyes at bedtime.     . loratadine (CLARITIN) 10 MG tablet take 1 tablet by mouth once daily 30 tablet 1  . ONE TOUCH LANCETS MISC Use to check blood sugars once daily. Dx code: 250.00. (Patient not taking: Reported on 06/21/2014) 200 each 2  . ONE TOUCH ULTRA TEST test strip TEST once daily 50 each 6  . oxyCODONE-acetaminophen (ROXICET) 5-325 MG per tablet Take 1-2 tablets by mouth every 4 (four) hours as needed for severe pain. 30 tablet 0  . Ranitidine HCl (ACID CONTROL PO) Take 1 tablet by mouth daily.    . traMADol (ULTRAM) 50 MG tablet take 1 tablet by mouth every 6 hours if needed for pain 60 tablet 3  . triamcinolone cream (KENALOG) 0.1 % Apply topically 2 (two) times daily. 30 g 1  . zolpidem (AMBIEN) 10 MG tablet Take 10 mg by mouth at bedtime.      No current facility-administered medications for this visit.    Review of Systems  General: Denies fever, diaphoresis, appetite change, and fatigue.  Respiratory: Denies SOB, cough, and wheezing.   Cardiovascular: Denies chest pain and palpitations.  Gastrointestinal: Denies nausea, vomiting, abdominal pain, and diarrhea Musculoskeletal: Positive for left arm pain. Denies back pain, and gait problem.  Neurological: Positive for left arm numbness. Denies dizziness, syncope, weakness, lightheadedness, and headaches.  Psychiatric/Behavioral: Denies mood changes, sleep disturbance, and agitation.   Objective:   Physical Exam: Filed Vitals:   07/01/14 1359  BP: 129/73  Pulse: 66  Temp: 98.4 F (36.9 C)  TempSrc: Oral  Weight: 189 lb (85.73 kg)  SpO2: 99%    General: Very pleasant AA female, alert, cooperative, NAD. HEENT: PERRL, EOMI. Moist mucus membranes Neck: Full range of motion without pain, supple, no lymphadenopathy or carotid bruits Lungs: Clear to ascultation bilaterally, normal work of respiration, no wheezes, rales, rhonchi Heart: RRR, no murmurs, gallops, or rubs Abdomen: Soft, non-tender, non-distended, BS  + Extremities: No cyanosis, clubbing, or edema Neurologic: Alert & oriented x3, cranial nerves II-XII intact, strength grossly intact. Numbness and tingling in the left arm/hand, w/ decreased sensation in LUE. Strength intact in LUE.    Assessment & Plan:   Please see problem based assessment and plan.  

## 2014-07-01 NOTE — Progress Notes (Signed)
*  Preliminary Results* Left upper extremity venous duplex completed. Left upper extremity is negative for deep and superficial vein thrombosis.  07/01/2014 1:38 PM  Maudry Mayhew, RVT, RDCS, RDMS

## 2014-07-02 DIAGNOSIS — N6012 Diffuse cystic mastopathy of left breast: Secondary | ICD-10-CM | POA: Insufficient documentation

## 2014-07-02 NOTE — Progress Notes (Signed)
Internal Medicine Clinic Attending  I saw and evaluated the patient.  I personally confirmed the key portions of the history and exam documented by Dr. Jones and I reviewed pertinent patient test results.  The assessment, diagnosis, and plan were formulated together and I agree with the documentation in the resident's note.     

## 2014-07-02 NOTE — Addendum Note (Signed)
Addended by: Gilles Chiquito B on: 07/02/2014 11:07 AM   Modules accepted: Level of Service

## 2014-07-02 NOTE — Assessment & Plan Note (Signed)
Ordered lipid panel, patient refused, will perform at next clinic visit.

## 2014-07-02 NOTE — Assessment & Plan Note (Signed)
Started as pain 3 weeks ago, spread throughout the left arm and hand. No strength deficits. No other neurologic abnormalities. XR performed in the ED on 06/21/14 shows degenerative cervical spondylosis with multilevel disc disease and facet disease. No acute cervical spine fracture. Also suggestive of uncinate spurring changes and facet disease contributing to bilateral foraminal stenosis most notably at C4-5, C5-6 and C6-7. Previous MRI in 2009 showed no cervical spinal stenosis, but did suggest multilevel neural foraminal stenosis. Both C6 neural foramen were severely affected and on the left it appeared chiefly related to a "soft disc". Also showed significant C7, C8 and T1 neural foraminal stenosis.  -Given symptoms and imaging findings, will repeat MRI (not done since 2009) -Refer to neurosurgery -Start Mobic 7.5 mg daily for some anti-inflammatory benefit in the cervical region.  -Patient to return in 4-6 weeks

## 2014-07-02 NOTE — Assessment & Plan Note (Signed)
Patient w/ abnormal mammogram, had breast biopsy which suggested [facility] w/ calcifications. No evidence of malignancy.  -Informed patient, gave copy of biopsy results

## 2014-07-22 ENCOUNTER — Ambulatory Visit (INDEPENDENT_AMBULATORY_CARE_PROVIDER_SITE_OTHER): Payer: Medicare HMO | Admitting: Internal Medicine

## 2014-07-22 VITALS — BP 118/60 | HR 66 | Temp 98.2°F | Ht 60.0 in | Wt 193.3 lb

## 2014-07-22 DIAGNOSIS — R600 Localized edema: Secondary | ICD-10-CM | POA: Diagnosis not present

## 2014-07-22 DIAGNOSIS — M150 Primary generalized (osteo)arthritis: Secondary | ICD-10-CM | POA: Diagnosis not present

## 2014-07-22 DIAGNOSIS — E1139 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: Secondary | ICD-10-CM

## 2014-07-22 DIAGNOSIS — Z6837 Body mass index (BMI) 37.0-37.9, adult: Secondary | ICD-10-CM

## 2014-07-22 DIAGNOSIS — R202 Paresthesia of skin: Secondary | ICD-10-CM | POA: Diagnosis not present

## 2014-07-22 DIAGNOSIS — H409 Unspecified glaucoma: Secondary | ICD-10-CM

## 2014-07-22 DIAGNOSIS — G4700 Insomnia, unspecified: Secondary | ICD-10-CM

## 2014-07-22 DIAGNOSIS — E119 Type 2 diabetes mellitus without complications: Secondary | ICD-10-CM

## 2014-07-22 DIAGNOSIS — E669 Obesity, unspecified: Secondary | ICD-10-CM

## 2014-07-22 DIAGNOSIS — M8949 Other hypertrophic osteoarthropathy, multiple sites: Secondary | ICD-10-CM

## 2014-07-22 DIAGNOSIS — J302 Other seasonal allergic rhinitis: Secondary | ICD-10-CM

## 2014-07-22 DIAGNOSIS — R200 Anesthesia of skin: Secondary | ICD-10-CM

## 2014-07-22 DIAGNOSIS — M47812 Spondylosis without myelopathy or radiculopathy, cervical region: Secondary | ICD-10-CM

## 2014-07-22 DIAGNOSIS — M159 Polyosteoarthritis, unspecified: Secondary | ICD-10-CM

## 2014-07-22 LAB — BASIC METABOLIC PANEL WITH GFR
BUN: 14 mg/dL (ref 6–23)
CO2: 28 mEq/L (ref 19–32)
Calcium: 9.5 mg/dL (ref 8.4–10.5)
Chloride: 102 mEq/L (ref 96–112)
Creat: 0.86 mg/dL (ref 0.50–1.10)
GFR, Est African American: 81 mL/min
GFR, Est Non African American: 71 mL/min
Glucose, Bld: 97 mg/dL (ref 70–99)
Potassium: 4.5 mEq/L (ref 3.5–5.3)
Sodium: 143 mEq/L (ref 135–145)

## 2014-07-22 LAB — POCT GLYCOSYLATED HEMOGLOBIN (HGB A1C): Hemoglobin A1C: 6.5

## 2014-07-22 LAB — GLUCOSE, CAPILLARY: Glucose-Capillary: 93 mg/dL (ref 65–99)

## 2014-07-22 MED ORDER — FUROSEMIDE 20 MG PO TABS: 20.0000 mg | ORAL_TABLET | Freq: Every day | ORAL | Status: DC

## 2014-07-22 NOTE — Progress Notes (Signed)
Subjective:    [name], female    DOB: November 13, 1950, 64 y.o.   MRN: [id number]  CC: F/U for left arm numbness  HPI  [name] is a 64yo woman with PMH of DM2 diet controlled, controlled HTN who presents for follow up after seeing Dr. Ronnald Ramp a few weeks ago for left arm numbness.  She reports today that her symptoms of pain are completely improved, however, she continues to have occasional numbness in her left hand.  It is more annoying than an issue.  She also has itching which is worse at night.  She saw the NSG on 6/29 and was prescribed nabumetome.  She is alternating taking this every other day with the meloxicam.  She is not taking ibuprofen as noted on her med list.  She is only taking one type of NSAID a day, and this is improving her symptoms.  She reports that the [facility] offered her injections, but she has had them before and they "did not work."  She thinks there may be an option for surgery in the future.  She reports that the physician said she can resume water aerobics and she is excited for this as she has gained weight.    She recently had a breast biopsy for benign findings.  The site is healing well and no longer causing her much discomfort.   She has a history of bilateral leg swelling and was on lasix about 2 years ago for a short period of time.  She has normal renal function and a TTE from about a year ago showed no systolic dysfunction.  She has return of the swelling today, which is bilateral and only minimally pitting.  There is no pain to palpation, warmth. She is concerned there may be some mild asymmetry.  She does have varicose veins.  This swelling improved when she was able to exercise and is now back.   She reports a history of glaucoma for which she takes drops.  I reminded her to see her eye doctor and to get evaluated for diabetic retinopathy.   She did not bring her medications.  She is not smoking.   Review of Systems  Constitutional: Positive  for activity change (due to arm pain). Negative for fever, chills, fatigue and unexpected weight change.  HENT: Positive for hearing loss (chronic, needs hearing aids per her). Negative for ear discharge and ear pain.   Eyes: Negative for photophobia and visual disturbance.  Respiratory: Negative for cough and shortness of breath.   Cardiovascular: Positive for leg swelling. Negative for chest pain and palpitations.  Gastrointestinal: Negative for nausea, vomiting, diarrhea and constipation.  Genitourinary: Negative for dysuria, urgency, enuresis and difficulty urinating.  Musculoskeletal: Negative for back pain, joint swelling and arthralgias.  Skin: Negative for rash and wound.       Varicose veins  Neurological: Positive for dizziness (chronic, has h/o vertigo) and numbness (left hand). Negative for weakness, light-headedness and headaches.       Objective:   Physical Exam  Constitutional: She is oriented to person, place, and time. She appears well-developed and well-nourished. No distress.  HENT:  Head: Normocephalic and atraumatic.  Eyes: Conjunctivae are normal. No scleral icterus.  Cardiovascular: Normal rate, regular rhythm and normal heart sounds.   No murmur heard. Pulmonary/Chest: Effort normal and breath sounds normal. No respiratory distress. She has no wheezes.  Abdominal: Soft. Bowel sounds are normal. There is no tenderness.  Musculoskeletal: She exhibits edema (mild, minimally  pitting at ankles, no asymmetry on my exam, no warmth or tenderness to palpation). She exhibits no tenderness.  Lymphadenopathy:    She has no cervical adenopathy.  Neurological: She is alert and oriented to person, place, and time. No cranial nerve deficit.  She has very minor decreased grip strength in the left hand 4++/5.  Possibly due to pain, but should be monitored closely with neurosurgery follow up.  Otherwise strength is intact and no sensation changes.     BMET and A1C today.         Assessment & Plan:  RTC in 3-4 months.

## 2014-07-22 NOTE — Assessment & Plan Note (Signed)
Shoulder and knee pain chronically.

## 2014-07-22 NOTE — Patient Instructions (Signed)
General Instructions: Please schedule a follow up visit within the next 3-4 months.   For your medications:   Please bring all of your pill  Bottles with you to each visit.  This will help make sure that we have an up to date list of all the medications you are taking.  Please also bring any over the counter herbal medications you are taking (not including advil, tylenol, etc.)  Please continue taking all of your medications as prescribed.  Be careful taking the meloxicam and nabumetone together, you should not take these on the same day as they can affect your kidneys.   Please start taking furosemide (lasix) 20mg  daily for your swelling.  You should take this in the morning as it is a diuretic and will make you pee more often.  Once your swelling is better, you can start taking this only as needed.    More information about furosemide is below.   Thank you!    Treatment Goals:  Goals (1 Years of Data) as of 07/22/14          As of Today 07/01/14 06/23/14 06/23/14 06/23/14     Blood Pressure   . Blood Pressure < 140/90  118/60 129/73 131/69 126/66 149/84     Diet   . Eat 3 fruit servings each day           Result Component   . HEMOGLOBIN A1C < 7.0         . LDL CALC < 100            Progress Toward Treatment Goals:  Treatment Goal 07/22/2014  Hemoglobin A1C at goal  Blood pressure at goal    Self Care Goals & Plans:  Self Care Goal 04/29/2014  Manage my medications take my medicines as prescribed; bring my medications to every visit; refill my medications on time  Monitor my health keep track of my blood glucose; bring my glucose meter and log to each visit  Eat healthy foods drink diet soda or water instead of juice or soda; eat more vegetables; eat foods that are low in salt; eat baked foods instead of fried foods  Be physically active find an activity I enjoy  Other -  Meeting treatment goals -    Home Blood Glucose Monitoring 11/21/2013  Check my blood sugar once a day   When to check my blood sugar before meals     Care Management & Community Referrals:  Referral 11/21/2013  Referrals made for care management support none needed  Referrals made to community resources none     Furosemide tablets What is this medicine? FUROSEMIDE (fyoor [place] se mide) is a diuretic. It helps you make more urine and to lose salt and excess water from your body. This medicine is used to treat high blood pressure, and edema or swelling from heart, kidney, or liver disease. This medicine may be used for other purposes; ask your health care provider or pharmacist if you have questions. COMMON BRAND NAME(S): Delone, Lasix What should I tell my health care provider before I take this medicine? They need to know if you have any of these conditions: -abnormal blood electrolytes -diarrhea or vomiting -gout -heart disease -kidney disease, small amounts of urine, or difficulty passing urine -liver disease -an unusual or allergic reaction to furosemide, sulfa drugs, other medicines, foods, dyes, or preservatives -pregnant or trying to get pregnant -breast-feeding How should I use this medicine? Take this medicine by mouth with a  glass of water. Follow the directions on the prescription label. You may take this medicine with or without food. If it upsets your stomach, take it with food or milk. Do not take your medicine more often than directed. Remember that you will need to pass more urine after taking this medicine. Do not take your medicine at a time of day that will cause you problems. Do not take at bedtime. Talk to your pediatrician regarding the use of this medicine in children. While this drug may be prescribed for selected conditions, precautions do apply. Overdosage: If you think you have taken too much of this medicine contact a poison control center or emergency room at once. NOTE: This medicine is only for you. Do not share this medicine with others. What if I miss a  dose? If you miss a dose, take it as soon as you can. If it is almost time for your next dose, take only that dose. Do not take double or extra doses. What may interact with this medicine? -aspirin and aspirin-like medicines -certain antibiotics -chloral hydrate -cisplatin -cyclosporine -digoxin -diuretics -laxatives -lithium -medicines for blood pressure -medicines that relax muscles for surgery -methotrexate -NSAIDs, medicines for pain and inflammation like ibuprofen, naproxen, or indomethacin -phenytoin -steroid medicines like prednisone or cortisone -sucralfate This list may not describe all possible interactions. Give your health care provider a list of all the medicines, herbs, non-prescription drugs, or dietary supplements you use. Also tell them if you smoke, drink alcohol, or use illegal drugs. Some items may interact with your medicine. What should I watch for while using this medicine? Visit your doctor or health care professional for regular checks on your progress. Check your blood pressure regularly. Ask your doctor or health care professional what your blood pressure should be, and when you should contact him or her. If you are a diabetic, check your blood sugar as directed. You may need to be on a special diet while taking this medicine. Check with your doctor. Also, ask how many glasses of fluid you need to drink a day. You must not get dehydrated. You may get drowsy or dizzy. Do not drive, use machinery, or do anything that needs mental alertness until you know how this drug affects you. Do not stand or sit up quickly, especially if you are an older patient. This reduces the risk of dizzy or fainting spells. Alcohol can make you more drowsy and dizzy. Avoid alcoholic drinks. This medicine can make you more sensitive to the sun. Keep out of the sun. If you cannot avoid being in the sun, wear protective clothing and use sunscreen. Do not use sun lamps or tanning  beds/booths. What side effects may I notice from receiving this medicine? Side effects that you should report to your doctor or health care professional as soon as possible: -blood in urine or stools -dry mouth -fever or chills -hearing loss or ringing in the ears -irregular heartbeat -muscle pain or weakness, cramps -skin rash -stomach upset, pain, or nausea -tingling or numbness in the hands or feet -unusually weak or tired -vomiting or diarrhea -yellowing of the eyes or skin Side effects that usually do not require medical attention (report to your doctor or health care professional if they continue or are bothersome): -headache -loss of appetite -unusual bleeding or bruising This list may not describe all possible side effects. Call your doctor for medical advice about side effects. You may report side effects to FDA at 1-800-FDA-1088. Where should I keep  my medicine? Keep out of the reach of children. Store at room temperature between 15 and 30 degrees C (59 and 86 degrees F). Protect from light. Throw away any unused medicine after the expiration date. NOTE: This sheet is a summary. It may not cover all possible information. If you have questions about this medicine, talk to your doctor, pharmacist, or health care provider.  2015, [employer]vier/Gold Standard. (2008-12-21 16:24:50)

## 2014-07-23 NOTE — Assessment & Plan Note (Addendum)
Being followed by NSG.  She is on nabumetone and meloxicam on alternating days.  Her pain is improved.  Numbness is still bothersome.  She has a slightly worsened neuro exam today, is due for close follow up with NSG.   She is due to get MRI of C spine.   Advised benadryl for itching at night.  Continue current pain management.

## 2014-07-23 NOTE — Assessment & Plan Note (Signed)
She is due to see the eye doctor next month for glaucoma and diabetic screening.

## 2014-07-23 NOTE — Assessment & Plan Note (Signed)
Has known disease, CT scan in 2001 but I cannot access report.  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  This is the likely cause of her hand numbness.  Pain is well controlled on current therapy.

## 2014-07-23 NOTE — Assessment & Plan Note (Signed)
She takes flonase nasal spray with good results.

## 2014-07-23 NOTE — Assessment & Plan Note (Signed)
A1C 6.5 today.  She is diet controlled.  Reminded her about her eye exam which is due.

## 2014-07-23 NOTE — Assessment & Plan Note (Signed)
This has been a problem for her off and on.  TTE from 2014 was normal, no systolic dysfunction.  Renal function has been stable.  She has varicose veins and her swelling has improved previously with lasix and with exercise.    Plan Restart Lasix 20mg  daily, she can decrease to PRN when things improve Restart exercise routine.

## 2014-07-23 NOTE — Assessment & Plan Note (Signed)
She has gained a bit of weight due to stopping exercise.  She is due to start back.

## 2014-07-27 ENCOUNTER — Encounter: Payer: Self-pay | Admitting: Internal Medicine

## 2014-08-10 ENCOUNTER — Other Ambulatory Visit: Payer: Self-pay | Admitting: Internal Medicine

## 2014-08-24 ENCOUNTER — Other Ambulatory Visit: Payer: Self-pay | Admitting: General Surgery

## 2014-08-24 DIAGNOSIS — K432 Incisional hernia without obstruction or gangrene: Secondary | ICD-10-CM

## 2014-08-26 ENCOUNTER — Telehealth: Payer: Self-pay | Admitting: Internal Medicine

## 2014-08-26 NOTE — Telephone Encounter (Signed)
Call to patient to confirm appointment for 08/27/14 at 1:15 [facility]

## 2014-08-27 ENCOUNTER — Encounter: Payer: Self-pay | Admitting: Internal Medicine

## 2014-08-27 ENCOUNTER — Ambulatory Visit (HOSPITAL_COMMUNITY)
Admission: RE | Admit: 2014-08-27 | Discharge: 2014-08-27 | Disposition: A | Discharge status: Home / Self Care | Payer: Medicare HMO | Source: Ambulatory Visit | Attending: Internal Medicine | Admitting: Internal Medicine

## 2014-08-27 ENCOUNTER — Ambulatory Visit (INDEPENDENT_AMBULATORY_CARE_PROVIDER_SITE_OTHER): Payer: Medicare HMO | Admitting: Internal Medicine

## 2014-08-27 DIAGNOSIS — N3090 Cystitis, unspecified without hematuria: Secondary | ICD-10-CM

## 2014-08-27 DIAGNOSIS — M5032 Other cervical disc degeneration, mid-cervical region: Secondary | ICD-10-CM | POA: Diagnosis not present

## 2014-08-27 DIAGNOSIS — R200 Anesthesia of skin: Secondary | ICD-10-CM | POA: Diagnosis present

## 2014-08-27 DIAGNOSIS — E119 Type 2 diabetes mellitus without complications: Secondary | ICD-10-CM | POA: Diagnosis not present

## 2014-08-27 DIAGNOSIS — M47892 Other spondylosis, cervical region: Secondary | ICD-10-CM | POA: Insufficient documentation

## 2014-08-27 LAB — POCT URINALYSIS DIPSTICK
Bilirubin, UA: NEGATIVE
Blood, UA: NEGATIVE
Glucose, UA: NEGATIVE
Ketones, UA: NEGATIVE
Leukocytes, UA: NEGATIVE
Nitrite, UA: NEGATIVE
Protein, UA: NEGATIVE
Spec Grav, UA: 1.02
Urobilinogen, UA: 1
pH, UA: 6.5

## 2014-08-27 MED ORDER — NITROFURANTOIN MONOHYD MACRO 100 MG PO CAPS: 100.0000 mg | ORAL_CAPSULE | Freq: Two times a day (BID) | ORAL | Status: AC

## 2014-08-27 NOTE — Assessment & Plan Note (Signed)
Needs eye exam and lipid panel but did not have time to complete these today.  She was scheduled for an MRI at 1:45PM.   -retinal scan and LP at next OV

## 2014-08-27 NOTE — Progress Notes (Signed)
[name], female   DOB: 1950-08-05, 64 y.o.   MRN: [id number]     Subjective:   [name] female    DOB: [birth year] 64 y.o.    MRN: [id number] Health Maintenance Due: Health Maintenance Due  Topic Date Due  . Hepatitis C Screening  May 23, 1950  . OPHTHALMOLOGY EXAM  09/09/2013  . LIPID PANEL  06/19/2014  . INFLUENZA VACCINE  08/17/2014    _________________________________________________  HPI: [name] is a 64 y.o. female here for UTI symptoms?  Pt has a PMH outlined below.  Please see problem-based charting assessment and plan for further details of medical issues addressed at today's visit.  PMH: Past Medical History  Diagnosis Date  . Diabetes mellitus   . Hypertension   . Hyperlipidemia   . Low back pain   . Cervical spondylosis   . Obesity   . Depression     followed by [name]; no longer of depakote, seroquel  . Chronic serous otitis media   . Foot drop   . Constipation   . Paraumbilical hernia   . Asthma   . COPD (chronic obstructive pulmonary disease)   . Anxiety   . GERD (gastroesophageal reflux disease)     Medications: Current Outpatient Prescriptions on File Prior to Visit  Medication Sig Dispense Refill  . acetaminophen (TYLENOL) 500 MG tablet Take 1 tablet (500 mg total) by mouth every 4 (four) hours as needed. Do not take more than 6 times per day 120 tablet 0  . albuterol (PROVENTIL HFA;VENTOLIN HFA) 108 (90 BASE) MCG/ACT inhaler Inhale 2 puffs into the lungs every 6 (six) hours as needed for wheezing. 1 Inhaler 6  . albuterol (PROVENTIL) (2.5 MG/3ML) 0.083% nebulizer solution Take 3 mLs (2.5 mg total) by nebulization every 6 (six) hours as needed for wheezing. 75 mL 2  . aspirin (ASPIR-81) 81 MG EC tablet Take 81 mg by mouth daily.      Marland Kitchen atorvastatin (LIPITOR) 40 MG tablet take 1 tablet by mouth once daily 90 tablet 3  . benazepril (LOTENSIN) 20 MG tablet Take 1 tablet (20 mg total) by mouth daily. 90  tablet 3  . Blood Glucose Monitoring Suppl (ONE TOUCH ULTRA MINI) W/DEVICE KIT Use to check blood sugars once daily. Dx code: 250.00. 1 each 0  . Cholecalciferol (VITAMIN D3) 1000 UNITS CAPS Take 2 tablets by mouth daily.      Marland Kitchen DEXILANT 60 MG capsule Take 60 mg by mouth daily.   1  . divalproex (DEPAKOTE ER) 500 MG 24 hr tablet Take 500 mg by mouth at bedtime.    . fluticasone (FLONASE) 50 MCG/ACT nasal spray instill 1 spray into each nostril once daily 16 g 11  . Fluticasone-Salmeterol (ADVAIR DISKUS) 100-50 MCG/DOSE AEPB Inhale 1 puff into the lungs 2 (two) times daily. 60 each 6  . furosemide (LASIX) 20 MG tablet Take 1 tablet (20 mg total) by mouth daily. 30 tablet 3  . gabapentin (NEURONTIN) 800 MG tablet Take 800 mg by mouth at bedtime.   0  . hydrochlorothiazide (HYDRODIURIL) 25 MG tablet Take 1 tablet (25 mg total) by mouth daily. 90 tablet 3  . ibuprofen (ADVIL,MOTRIN) 800 MG tablet Take 1 tablet (800 mg total) by mouth 3 (three) times daily. 21 tablet 0  . imipramine (TOFRANIL) 50 MG tablet Take 50 mg by mouth daily.     Marland Kitchen ipratropium (ATROVENT) 0.02 % nebulizer solution Take 2.5 mLs (0.5 mg total) by nebulization every  4 (four) hours as needed for wheezing. 75 mL 6  . latanoprost (XALATAN) 0.005 % ophthalmic solution Place 1 drop into both eyes at bedtime.     Marland Kitchen loratadine (CLARITIN) 10 MG tablet take 1 tablet by mouth once daily 30 tablet 1  . meloxicam (MOBIC) 7.5 MG tablet Take 1 tablet (7.5 mg total) by mouth daily. 30 tablet 1  . nabumetone (RELAFEN) 500 MG tablet Take 500 mg by mouth 2 (two) times daily.  0  . ONE TOUCH LANCETS MISC Use to check blood sugars once daily. Dx code: 250.00. (Patient not taking: Reported on 06/21/2014) 200 each 2  . ONE TOUCH ULTRA TEST test strip TEST once daily 50 each 6  . triamcinolone cream (KENALOG) 0.1 % Apply topically 2 (two) times daily. 30 g 1  . zolpidem (AMBIEN) 5 MG tablet   0   No current facility-administered medications on file prior  to visit.    Allergies: No Known Allergies  FH: Family History  Problem Relation Age of Onset  . Colon cancer Neg Hx     SH: Social History   Social History  . Marital Status: Divorced    Spouse Name: N/A  . Number of Children: N/A  . Years of Education: N/A   Social History Main Topics  . Smoking status: Former Smoker    Types: Cigarettes    Quit date: 01/17/1996  . Smokeless tobacco: Never Used  . Alcohol Use: No  . Drug Use: No  . Sexual Activity: Not on file   Other Topics Concern  . Not on file   Social History Narrative    Review of Systems: Constitutional: Negative for fever, chills and weight loss.  Eyes: Negative for blurred vision.  Respiratory: Negative for cough and shortness of breath.  Cardiovascular: Negative for chest pain, palpitations and leg swelling.  Gastrointestinal: Negative for nausea, vomiting, abdominal pain, diarrhea, constipation and blood in stool; +suprapubic tenderness. Genitourinary: +dysuria, urgency and frequency. Musculoskeletal: Negative for myalgias and back pain.  Neurological: Negative for dizziness, weakness and headaches.     Objective:   Vital Signs: There were no vitals filed for this visit.    BP Readings from Last 3 Encounters:  07/22/14 118/60  07/01/14 129/73  06/23/14 131/69    Physical Exam: Constitutional: Vital signs reviewed.  Patient is in NAD and cooperative with exam.  Head: Normocephalic and atraumatic. Eyes: EOMI, conjunctivae nl, no scleral icterus.  Neck: Supple. Cardiovascular: RRR, no MRG. Pulmonary/Chest: normal effort, CTAB, no wheezes, rales, or rhonchi. Abdominal: Soft. Mild suprapubic tenderness, no rebound or guarding.  +BS. Neurological: A&O x3, cranial nerves II-XII are grossly intact, moving all extremities. Extremities: 2+DP b/l; no pitting edema. Skin: Warm, dry and intact. No rash.   Assessment & Plan:   Assessment and plan was discussed and formulated with my attending.

## 2014-08-27 NOTE — Patient Instructions (Signed)
Thank you for your visit today.   Please return to the internal medicine clinic in November to see Dr. Daryll Drown or sooner if needed.     I have made the following additions/changes to your medications:  I will treat your urinary symptoms with an antibiotic macrobid for 7 days.  Please pick this up at your pharmacy.   Please be sure to bring all of your medications with you to every visit; this includes herbal supplements, vitamins, eye drops, and any over-the-counter medications.   Should you have any questions regarding your medications and/or any new or worsening symptoms, please be sure to call the clinic at [phone].   If you believe that you are suffering from a life threatening condition or one that may result in the loss of limb or function, then you should call 911 and proceed to the nearest Emergency Department. Urinary Tract Infection Urinary tract infections (UTIs) can develop anywhere along your urinary tract. Your urinary tract is your body's drainage system for removing wastes and extra water. Your urinary tract includes two kidneys, two ureters, a bladder, and a urethra. Your kidneys are a pair of bean-shaped organs. Each kidney is about the size of your fist. They are located below your ribs, one on each side of your spine. CAUSES Infections are caused by microbes, which are microscopic organisms, including fungi, viruses, and bacteria. These organisms are so small that they can only be seen through a microscope. Bacteria are the microbes that most commonly cause UTIs. SYMPTOMS  Symptoms of UTIs may vary by age and gender of the patient and by the location of the infection. Symptoms in young women typically include a frequent and intense urge to urinate and a painful, burning feeling in the bladder or urethra during urination. Older women and men are more likely to be tired, shaky, and weak and have muscle aches and abdominal pain. A fever may mean the infection is in your  kidneys. Other symptoms of a kidney infection include pain in your back or sides below the ribs, nausea, and vomiting. DIAGNOSIS To diagnose a UTI, your caregiver will ask you about your symptoms. Your caregiver also will ask to provide a urine sample. The urine sample will be tested for bacteria and white blood cells. White blood cells are made by your body to help fight infection. TREATMENT  Typically, UTIs can be treated with medication. Because most UTIs are caused by a bacterial infection, they usually can be treated with the use of antibiotics. The choice of antibiotic and length of treatment depend on your symptoms and the type of bacteria causing your infection. HOME CARE INSTRUCTIONS  If you were prescribed antibiotics, take them exactly as your caregiver instructs you. Finish the medication even if you feel better after you have only taken some of the medication.  Drink enough water and fluids to keep your urine clear or pale yellow.  Avoid caffeine, tea, and carbonated beverages. They tend to irritate your bladder.  Empty your bladder often. Avoid holding urine for long periods of time.  Empty your bladder before and after sexual intercourse.  After a bowel movement, women should cleanse from front to back. Use each tissue only once. SEEK MEDICAL CARE IF:   You have back pain.  You develop a fever.  Your symptoms do not begin to resolve within 3 days. SEEK IMMEDIATE MEDICAL CARE IF:   You have severe back pain or lower abdominal pain.  You develop chills.  You have nausea  or vomiting.  You have continued burning or discomfort with urination. MAKE SURE YOU:   Understand these instructions.  Will watch your condition.  Will get help right away if you are not doing well or get worse. Document Released: 10/12/2004 Document Revised: 07/04/2011 Document Reviewed: 02/10/2011 [facility] Patient Information 2015 [place], [place]. This information is not intended to replace  advice given to you by your health care provider. Make sure you discuss any questions you have with your health care provider.

## 2014-08-27 NOTE — Assessment & Plan Note (Addendum)
Pt p/w dysuria and darker urine for ~2 weeks.  Also endorses urgency, frequency and mild suprapubic tenderness.  Denies vaginal discharge, fever/chills, N/V, back pain.  Not sexually active.  Has experienced UTIs in the past and reports this feels similar to the previous one.  POCT urine dipstick without leukocytes or nitrites.  However, pt symptomatic.  Could also be vulvovaginal atrophy?  Pt also reports a new soap which could also be responsible.  -macrobid x 7 days -UA/microscopic and culture -advised to return if worsening symptoms or develops fever/chils, back pain  -provided handout on cystitis  -f/u with PCP

## 2014-08-28 ENCOUNTER — Other Ambulatory Visit: Payer: Medicare HMO

## 2014-08-31 LAB — URINALYSIS, ROUTINE W REFLEX MICROSCOPIC

## 2014-08-31 LAB — URINE CULTURE

## 2014-08-31 NOTE — Progress Notes (Signed)
Internal Medicine Clinic Attending  Case discussed with Dr. Gill soon after the resident saw the patient.  We reviewed the resident's history and exam and pertinent patient test results.  I agree with the assessment, diagnosis, and plan of care documented in the resident's note.  

## 2014-09-01 ENCOUNTER — Inpatient Hospital Stay: Admission: RE | Admit: 2014-09-01 | Payer: Medicare HMO | Source: Ambulatory Visit

## 2014-09-09 ENCOUNTER — Other Ambulatory Visit: Payer: Self-pay | Admitting: Neurosurgery

## 2014-09-22 ENCOUNTER — Other Ambulatory Visit: Payer: Self-pay | Admitting: Internal Medicine

## 2014-09-22 DIAGNOSIS — J4520 Mild intermittent asthma, uncomplicated: Secondary | ICD-10-CM

## 2014-09-22 DIAGNOSIS — J302 Other seasonal allergic rhinitis: Secondary | ICD-10-CM

## 2014-09-22 NOTE — Telephone Encounter (Signed)
Patient is requesting albuterol medications to be filled @ [facility] on [address]

## 2014-09-23 ENCOUNTER — Telehealth: Payer: Self-pay | Admitting: Internal Medicine

## 2014-09-23 MED ORDER — ALBUTEROL SULFATE (2.5 MG/3ML) 0.083% IN NEBU: 2.5000 mg | INHALATION_SOLUTION | Freq: Four times a day (QID) | RESPIRATORY_TRACT | Status: DC | PRN

## 2014-09-23 MED ORDER — ALBUTEROL SULFATE HFA 108 (90 BASE) MCG/ACT IN AERS: 2.0000 | INHALATION_SPRAY | Freq: Four times a day (QID) | RESPIRATORY_TRACT | Status: DC | PRN

## 2014-09-23 NOTE — Telephone Encounter (Signed)
Call to patient to confirm appointment for 09/24/14 at 1:15 [facility]

## 2014-09-24 ENCOUNTER — Ambulatory Visit (INDEPENDENT_AMBULATORY_CARE_PROVIDER_SITE_OTHER): Payer: Medicare HMO | Admitting: Internal Medicine

## 2014-09-24 ENCOUNTER — Encounter (HOSPITAL_COMMUNITY)
Admission: RE | Admit: 2014-09-24 | Discharge: 2014-09-24 | Disposition: A | Discharge status: Home / Self Care | Payer: Medicare HMO | Source: Ambulatory Visit | Attending: Neurosurgery | Admitting: Neurosurgery

## 2014-09-24 ENCOUNTER — Encounter (HOSPITAL_COMMUNITY): Payer: Self-pay

## 2014-09-24 ENCOUNTER — Encounter: Payer: Self-pay | Admitting: Internal Medicine

## 2014-09-24 VITALS — BP 127/72 | HR 74 | Temp 97.5°F | Ht 60.0 in | Wt 191.5 lb

## 2014-09-24 DIAGNOSIS — J069 Acute upper respiratory infection, unspecified: Secondary | ICD-10-CM

## 2014-09-24 DIAGNOSIS — Z01812 Encounter for preprocedural laboratory examination: Secondary | ICD-10-CM | POA: Diagnosis not present

## 2014-09-24 DIAGNOSIS — M479 Spondylosis, unspecified: Secondary | ICD-10-CM | POA: Diagnosis not present

## 2014-09-24 HISTORY — DX: Cardiac murmur, unspecified: R01.1

## 2014-09-24 LAB — BASIC METABOLIC PANEL
Anion gap: 9 (ref 5–15)
BUN: 12 mg/dL (ref 6–20)
CO2: 28 mmol/L (ref 22–32)
Calcium: 9.8 mg/dL (ref 8.9–10.3)
Chloride: 103 mmol/L (ref 101–111)
Creatinine, Ser: 0.88 mg/dL (ref 0.44–1.00)
GFR calc Af Amer: >60 mL/min (ref >60)
GFR calc non Af Amer: >60 mL/min (ref >60)
Glucose, Bld: 87 mg/dL (ref 65–99)
Potassium: 3.9 mmol/L (ref 3.5–5.1)
Sodium: 140 mmol/L (ref 135–145)

## 2014-09-24 LAB — GLUCOSE, CAPILLARY
Glucose-Capillary: 79 mg/dL (ref 65–99)
Glucose-Capillary: 84 mg/dL (ref 65–99)

## 2014-09-24 LAB — CBC
HCT: 43.3 % (ref 36.0–46.0)
Hemoglobin: 13.9 g/dL (ref 12.0–15.0)
MCH: 28.2 pg (ref 26.0–34.0)
MCHC: 32.1 g/dL (ref 30.0–36.0)
MCV: 87.8 fL (ref 78.0–100.0)
Platelets: 260 10*3/uL (ref 150–400)
RBC: 4.93 MIL/uL (ref 3.87–5.11)
RDW: 13.3 % (ref 11.5–15.5)
WBC: 6.7 10*3/uL (ref 4.0–10.5)

## 2014-09-24 LAB — SURGICAL PCR SCREEN
MRSA, PCR: NEGATIVE
Staphylococcus aureus: NEGATIVE

## 2014-09-24 MED ORDER — GUAIFENESIN-DM 100-10 MG/5ML PO SYRP: 10.0000 mL | ORAL_SOLUTION | ORAL | Status: DC | PRN

## 2014-09-24 NOTE — Progress Notes (Signed)
[name], female   DOB: [birth year], 64 y.o.   MRN: [id number]   Subjective:   [name] female   DOB: 06/11/1950 64 y.o.   MRN: [id number]  HPI: Mariah Henderson is a 64 y.o. with PMH listed below., presented today with complainst of Cough that started 5 days ago, and now with slight greenish productive sputum . At the time she also had runny nose- clear rhinorrhea, and scratchy throat, which has resolved. She was coming in for pre-op labs in the hospital for neck surgery- no details in epic, and wanted to be sure she does not need antibiotics. No SOB, DOE , no chest pain, no fever or chills.  Appetite is good, she has no malaise. She has not had the flu shot this year, she endorses possible sick contacts.   Past Medical History  Diagnosis Date  . Hypertension   . Hyperlipidemia   . Low back pain   . Cervical spondylosis   . Obesity   . Depression     followed by [name]; no longer of depakote, seroquel  . Chronic serous otitis media   . Foot drop   . Constipation   . Paraumbilical hernia   . Asthma   . COPD (chronic obstructive pulmonary disease)   . Anxiety   . GERD (gastroesophageal reflux disease)   . Diabetes mellitus    Current Outpatient Prescriptions  Medication Sig Dispense Refill  . acetaminophen (TYLENOL) 500 MG tablet Take 1 tablet (500 mg total) by mouth every 4 (four) hours as needed. Do not take more than 6 times per day 120 tablet 0  . albuterol (PROVENTIL HFA;VENTOLIN HFA) 108 (90 BASE) MCG/ACT inhaler Inhale 2 puffs into the lungs every 6 (six) hours as needed for wheezing. 3 Inhaler 3  . albuterol (PROVENTIL) (2.5 MG/3ML) 0.083% nebulizer solution Take 3 mLs (2.5 mg total) by nebulization every 6 (six) hours as needed for wheezing. 1080 mL 3  . aspirin (ASPIR-81) 81 MG EC tablet Take 81 mg by mouth daily.      Marland Kitchen atorvastatin (LIPITOR) 40 MG tablet take 1 tablet by mouth once daily 90 tablet 3  . benazepril (LOTENSIN) 20 MG  tablet Take 1 tablet (20 mg total) by mouth daily. 90 tablet 3  . Blood Glucose Monitoring Suppl (ONE TOUCH ULTRA MINI) W/DEVICE KIT Use to check blood sugars once daily. Dx code: 250.00. 1 each 0  . Cholecalciferol (VITAMIN D3) 1000 UNITS CAPS Take 2 tablets by mouth daily.      Marland Kitchen DEXILANT 60 MG capsule Take 60 mg by mouth daily.   1  . divalproex (DEPAKOTE ER) 500 MG 24 hr tablet Take 500 mg by mouth at bedtime.    . fluticasone (FLONASE) 50 MCG/ACT nasal spray instill 1 spray into each nostril once daily 16 g 11  . Fluticasone-Salmeterol (ADVAIR DISKUS) 100-50 MCG/DOSE AEPB Inhale 1 puff into the lungs 2 (two) times daily. 60 each 6  . furosemide (LASIX) 20 MG tablet Take 1 tablet (20 mg total) by mouth daily. 30 tablet 3  . gabapentin (NEURONTIN) 800 MG tablet Take 800 mg by mouth at bedtime.   0  . guaiFENesin-dextromethorphan (ROBITUSSIN DM) 100-10 MG/5ML syrup Take 10 mLs by mouth every 4 (four) hours as needed for cough. 118 mL 0  . hydrochlorothiazide (HYDRODIURIL) 25 MG tablet Take 1 tablet (25 mg total) by mouth daily. 90 tablet 3  . ibuprofen (ADVIL,MOTRIN) 800 MG tablet Take 1 tablet (800 mg  total) by mouth 3 (three) times daily. 21 tablet 0  . imipramine (TOFRANIL) 50 MG tablet Take 50 mg by mouth daily.     Marland Kitchen ipratropium (ATROVENT) 0.02 % nebulizer solution Take 2.5 mLs (0.5 mg total) by nebulization every 4 (four) hours as needed for wheezing. 75 mL 6  . latanoprost (XALATAN) 0.005 % ophthalmic solution Place 1 drop into both eyes at bedtime.     Marland Kitchen loratadine (CLARITIN) 10 MG tablet take 1 tablet by mouth once daily 30 tablet 1  . meloxicam (MOBIC) 7.5 MG tablet Take 1 tablet (7.5 mg total) by mouth daily. 30 tablet 1  . nabumetone (RELAFEN) 500 MG tablet Take 500 mg by mouth 2 (two) times daily.  0  . ONE TOUCH LANCETS MISC Use to check blood sugars once daily. Dx code: 250.00. (Patient not taking: Reported on 06/21/2014) 200 each 2  . ONE TOUCH ULTRA TEST test strip TEST once daily  50 each 6  . triamcinolone cream (KENALOG) 0.1 % Apply topically 2 (two) times daily. 30 g 1  . zolpidem (AMBIEN) 5 MG tablet   0   No current facility-administered medications for this visit.   Family History  Problem Relation Age of Onset  . Colon cancer Neg Hx    Social History   Social History  . Marital Status: Divorced    Spouse Name: N/A  . Number of Children: N/A  . Years of Education: N/A   Social History Main Topics  . Smoking status: Former Smoker    Types: Cigarettes    Quit date: 01/17/1996  . Smokeless tobacco: Never Used  . Alcohol Use: No  . Drug Use: No  . Sexual Activity: Not Asked   Other Topics Concern  . None   Social History Narrative   Review of Systems: SKIN- No Rash, colour changes or itching. HEAD- No Headache or dizziness. GI- No  vomiting, diarrhoea,  abd pain. URINARY- No Frequency, dysuria. Constitutional - Good appetite, no fever.  Objective:  Physical Exam: Filed Vitals:   09/24/14 1318  BP: 127/72  Pulse: 74  Temp: 97.5 F (36.4 C)  TempSrc: Oral  Height: 5' (1.524 m)  Weight: 191 lb 8 oz (86.864 kg)  SpO2: 100%   GENERAL- alert, co-operative, appears as stated age, not in any distress. HEENT- Atraumatic, normocephalic, PERRL CARDIAC- RRR, no murmurs, rubs or gallops. RESP- Moving equal volumes of air, equal breath sounds, and clear to auscultation bilaterally, no wheezes or crackles. ABDOMEN- Soft, nontender,  bowel sounds present. NEURO-Alert and oriented to TPP,  EXTREMITIES- pulse 2+, symmetric, trace pitting pedal edema. SKIN- Warm, dry, No rash or lesion. PSYCH- Normal mood and affect, appropriate thought content and speech.  Assessment & Plan:   The patient's case and plan of care was discussed with attending physician, Dr. Evette Doffing.  URTI- Most likely of viral etiology considering prior Sore throat, rhinorrhea and now cough. No SOB and No fever, clear lung exam.  - No need for chest xray at this time -  Antibiotics not indicated - Guaifenasin with Dextromethophan syrum- 72ms Q4H PRN - return precautions given.

## 2014-09-24 NOTE — Progress Notes (Signed)
PCP is Gilles Chiquito  Patient denied having any acute cardiac issues, but informed Nurse that she saw her PCP today because she has asthma and developed a cough and was coughing up green sputum on last Saturday (09/19/14). No coughing noted during entire PAT visit. Patient stated "I don't have the cough now." Patient denied having any fever, chills, or shortness of breath.  CBG upon arrival to PAT was 79. Patient stated she consumed some coffee and two cinnamon rolls for breakfast. Debbie, RN gave patient two packs of graham crackers, peanut butter, applesauce, and a Sprite Zero soda to drink. Patient only consumed the two packs of graham crackers. Nurse rechecked blood sugar after patient consumed crackers and it was  84.   Patient appeared to be very apprehensive about having lab work performed, therefore Nurse stayed in lab with patient while Gerilyn Nestle (Quarry manager) obtained labs. Patient tolerated it well. 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

## 2014-09-24 NOTE — Patient Instructions (Signed)
I have prescribed a medication for you called Robittusin- take 18mls every 4 to 6 hours.   If you start having fevers or shortness of breath, let us know.    Upper Respiratory Infection, Adult An upper respiratory infection (URI) is also sometimes known as the common cold. The upper respiratory tract includes the nose, sinuses, throat, trachea, and bronchi. Bronchi are the airways leading to the lungs. Most people improve within 1 week, but symptoms can last up to 2 weeks. A residual cough may last even longer.  CAUSES Many different viruses can infect the tissues lining the upper respiratory tract. The tissues become irritated and inflamed and often become very moist. Mucus production is also common. A cold is contagious. You can easily spread the virus to others by oral contact. This includes kissing, sharing a glass, coughing, or sneezing. Touching your mouth or nose and then touching a surface, which is then touched by another person, can also spread the virus. SYMPTOMS  Symptoms typically develop 1 to 3 days after you come in contact with a cold virus. Symptoms vary from person to person. They may include:  Runny nose.  Sneezing.  Nasal congestion.  Sinus irritation.  Sore throat.  Loss of voice (laryngitis).  Cough.  Fatigue.  Muscle aches.  Loss of appetite.  Headache.  Low-grade fever. DIAGNOSIS  You might diagnose your own cold based on familiar symptoms, since most people get a cold 2 to 3 times a year. Your caregiver can confirm this based on your exam. Most importantly, your caregiver can check that your symptoms are not due to another disease such as strep throat, sinusitis, pneumonia, asthma, or epiglottitis. Blood tests, throat tests, and X-rays are not necessary to diagnose a common cold, but they may sometimes be helpful in excluding other more serious diseases. Your caregiver will decide if any further tests are required. RISKS AND COMPLICATIONS  You may be at  risk for a more severe case of the common cold if you smoke cigarettes, have chronic heart disease (such as heart failure) or lung disease (such as asthma), or if you have a weakened immune system. The very young and very old are also at risk for more serious infections. Bacterial sinusitis, middle ear infections, and bacterial pneumonia can complicate the common cold. The common cold can worsen asthma and chronic obstructive pulmonary disease (COPD). Sometimes, these complications can require emergency medical care and may be life-threatening. PREVENTION  The best way to protect against getting a cold is to practice good hygiene. Avoid oral or hand contact with people with cold symptoms. Wash your hands often if contact occurs. There is no clear evidence that vitamin C, vitamin E, echinacea, or exercise reduces the chance of developing a cold. However, it is always recommended to get plenty of rest and practice good nutrition. TREATMENT  Treatment is directed at relieving symptoms. There is no cure. Antibiotics are not effective, because the infection is caused by a virus, not by bacteria. Treatment may include:  Increased fluid intake. Sports drinks offer valuable electrolytes, sugars, and fluids.  Breathing heated mist or steam (vaporizer or shower).  Eating chicken soup or other clear broths, and maintaining good nutrition.  Getting plenty of rest.  Using gargles or lozenges for comfort.  Controlling fevers with ibuprofen or acetaminophen as directed by your caregiver.  Increasing usage of your inhaler if you have asthma. Zinc gel and zinc lozenges, taken in the first 24 hours of the common cold, can shorten the  duration and lessen the severity of symptoms. Pain medicines may help with fever, muscle aches, and throat pain. A variety of non-prescription medicines are available to treat congestion and runny nose. Your caregiver can make recommendations and may suggest nasal or lung inhalers for  other symptoms.  HOME CARE INSTRUCTIONS   Only take over-the-counter or prescription medicines for pain, discomfort, or fever as directed by your caregiver.  Use a warm mist humidifier or inhale steam from a shower to increase air moisture. This may keep secretions moist and make it easier to breathe.  Drink enough water and fluids to keep your urine clear or pale yellow.  Rest as needed.  Return to work when your temperature has returned to normal or as your caregiver advises. You may need to stay home longer to avoid infecting others. You can also use a face mask and careful hand washing to prevent spread of the virus. SEEK MEDICAL CARE IF:   After the first few days, you feel you are getting worse rather than better.  You need your caregiver's advice about medicines to control symptoms.  You develop chills, worsening shortness of breath, or brown or red sputum. These may be signs of pneumonia.  You develop yellow or brown nasal discharge or pain in the face, especially when you bend forward. These may be signs of sinusitis.  You develop a fever, swollen neck glands, pain with swallowing, or white areas in the back of your throat. These may be signs of strep throat. SEEK IMMEDIATE MEDICAL CARE IF:   You have a fever.  You develop severe or persistent headache, ear pain, sinus pain, or chest pain.  You develop wheezing, a prolonged cough, cough up blood, or have a change in your usual mucus (if you have chronic lung disease).  You develop sore muscles or a stiff neck.

## 2014-09-24 NOTE — Pre-Procedure Instructions (Signed)
Mariah Henderson  09/24/2014     Your procedure is scheduled on : Friday October 02, 2014 at 7:30 AM.  Report to [facility] Admitting at 5:30 A.M.  Call this number if you have problems the morning of surgery: [phone]   Remember:  Do not eat food or drink liquids after midnight.  Take these medicines the morning of surgery with A SIP OF WATER : Acetaminophen (Tylenol) if needed, Albuterol inhaler/nebulizer IF needed, Dexilant, Flonase nasal spray, Atrovent nebulizer if needed, Loratadine (Claritin)   Stop taking any vitamins, herbal medications, Ibuprofen, Advil, Motrin, Aleve   How to Manage Your Diabetes Before Surgery   Why is it important to control my blood sugar before and after surgery?   Improving blood sugar levels before and after surgery helps healing and can limit problems.  A way of improving blood sugar control is eating a healthy diet by:  - Eating less sugar and carbohydrates  - Increasing activity/exercise  - Talk with your doctor about reaching your blood sugar goals  High blood sugars (greater than 180 mg/dL) can raise your risk of infections and slow down your recovery so you will need to focus on controlling your diabetes during the weeks before surgery.  Make sure that the doctor who takes care of your diabetes knows about your planned surgery including the date and location.  How do I manage my blood sugars before surgery?   Check your blood sugar at least 4 times a day, 2 days before surgery to make sure that they are not too high or low.   Check your blood sugar the morning of your surgery when you wake up and every 2 hours until you get to the Short-Stay unit.  If your blood sugar is less than 70 mg/dL, you will need to treat for low blood sugar by:  Treat a low blood sugar (less than 70 mg/dL) with 1/2 cup of clear juice (cranberry or apple), 4 glucose tablets, OR glucose gel.  Recheck blood sugar in 15 minutes after treatment (to  make sure it is greater than 70 mg/dL).  If blood sugar is not greater than 70 mg/dL on re-check, call [phone] for further instructions.   Report your blood sugar to the Short-Stay nurse when you get to Short-Stay.  References:  [facility], 2007 "How to Manage your Diabetes Before and After Surgery".  What do I do about my diabetes medications?   Do not take oral diabetes medicines (pills) the morning of surgery.   Do not wear jewelry, make-up or nail polish.  Do not wear lotions, powders, or perfumes.  You may wear deodorant.  Do not shave 48 hours prior to surgery.    Do not bring valuables to the hospital.  [facility] is not responsible for any belongings or valuables.  Contacts, dentures or bridgework may not be worn into surgery.  Leave your suitcase in the car.  After surgery it may be brought to your room.  For patients admitted to the hospital, discharge time will be determined by your treatment team.  Patients discharged the day of surgery will not be allowed to drive home.   Name and phone number of your driver:    Special instructions:  Shower using CHG soap the night before and the morning of your surgery  Please read over the following fact sheets that you were given. Pain Booklet, Coughing and Deep Breathing, MRSA Information and Surgical Site Infection Prevention

## 2014-09-25 LAB — HEMOGLOBIN A1C
Hgb A1c MFr Bld: 6.9 % — ABNORMAL HIGH (ref 4.8–5.6)
Mean Plasma Glucose: 151 mg/dL

## 2014-09-25 NOTE — Progress Notes (Signed)
Internal Medicine Clinic Attending  Case discussed with Dr. Emokpae soon after the resident saw the patient.  We reviewed the resident's history and exam and pertinent patient test results.  I agree with the assessment, diagnosis, and plan of care documented in the resident's note. 

## 2014-09-30 ENCOUNTER — Other Ambulatory Visit: Payer: Medicare HMO

## 2014-10-02 NOTE — Progress Notes (Signed)
Verified with pt time of arrival of 5:30 AM, Monday, 10/05/14.

## 2014-10-05 ENCOUNTER — Inpatient Hospital Stay (HOSPITAL_COMMUNITY): Payer: Medicare HMO | Admitting: Anesthesiology

## 2014-10-05 ENCOUNTER — Inpatient Hospital Stay (HOSPITAL_COMMUNITY): Payer: Medicare HMO

## 2014-10-05 ENCOUNTER — Inpatient Hospital Stay (HOSPITAL_COMMUNITY)
Admission: RE | Admit: 2014-10-05 | Discharge: 2014-10-06 | DRG: 473 | Disposition: A | Discharge status: Home / Self Care | Payer: Medicare HMO | Source: Ambulatory Visit | Attending: Neurosurgery | Admitting: Neurosurgery

## 2014-10-05 ENCOUNTER — Encounter (HOSPITAL_COMMUNITY): Payer: Self-pay | Admitting: *Deleted

## 2014-10-05 ENCOUNTER — Encounter (HOSPITAL_COMMUNITY)
Admission: RE | Disposition: A | Discharge status: Home / Self Care | Payer: Self-pay | Source: Ambulatory Visit | Attending: Neurosurgery

## 2014-10-05 DIAGNOSIS — M79602 Pain in left arm: Secondary | ICD-10-CM | POA: Diagnosis present

## 2014-10-05 DIAGNOSIS — K219 Gastro-esophageal reflux disease without esophagitis: Secondary | ICD-10-CM | POA: Diagnosis present

## 2014-10-05 DIAGNOSIS — Z23 Encounter for immunization: Secondary | ICD-10-CM

## 2014-10-05 DIAGNOSIS — J45909 Unspecified asthma, uncomplicated: Secondary | ICD-10-CM | POA: Diagnosis present

## 2014-10-05 DIAGNOSIS — I10 Essential (primary) hypertension: Secondary | ICD-10-CM | POA: Diagnosis present

## 2014-10-05 DIAGNOSIS — E669 Obesity, unspecified: Secondary | ICD-10-CM | POA: Diagnosis present

## 2014-10-05 DIAGNOSIS — Z87891 Personal history of nicotine dependence: Secondary | ICD-10-CM | POA: Diagnosis not present

## 2014-10-05 DIAGNOSIS — Z79899 Other long term (current) drug therapy: Secondary | ICD-10-CM | POA: Diagnosis not present

## 2014-10-05 DIAGNOSIS — J449 Chronic obstructive pulmonary disease, unspecified: Secondary | ICD-10-CM | POA: Diagnosis present

## 2014-10-05 DIAGNOSIS — Z90710 Acquired absence of both cervix and uterus: Secondary | ICD-10-CM | POA: Diagnosis not present

## 2014-10-05 DIAGNOSIS — E785 Hyperlipidemia, unspecified: Secondary | ICD-10-CM | POA: Diagnosis present

## 2014-10-05 DIAGNOSIS — Z7982 Long term (current) use of aspirin: Secondary | ICD-10-CM | POA: Diagnosis not present

## 2014-10-05 DIAGNOSIS — M4722 Other spondylosis with radiculopathy, cervical region: Secondary | ICD-10-CM | POA: Diagnosis present

## 2014-10-05 DIAGNOSIS — M4322 Fusion of spine, cervical region: Secondary | ICD-10-CM

## 2014-10-05 DIAGNOSIS — F419 Anxiety disorder, unspecified: Secondary | ICD-10-CM | POA: Diagnosis present

## 2014-10-05 HISTORY — PX: ANTERIOR CERVICAL DECOMP/DISCECTOMY FUSION: SHX1161

## 2014-10-05 LAB — GLUCOSE, CAPILLARY
Glucose-Capillary: 117 mg/dL — ABNORMAL HIGH (ref 65–99)
Glucose-Capillary: 204 mg/dL — ABNORMAL HIGH (ref 65–99)
Glucose-Capillary: 188 mg/dL — ABNORMAL HIGH (ref 65–99)
Glucose-Capillary: 168 mg/dL — ABNORMAL HIGH (ref 65–99)
Glucose-Capillary: 146 mg/dL — ABNORMAL HIGH (ref 65–99)

## 2014-10-05 SURGERY — ANTERIOR CERVICAL DECOMPRESSION/DISCECTOMY FUSION 2 LEVELS
Anesthesia: General | Site: Neck

## 2014-10-05 MED ORDER — HYDROMORPHONE HCL 1 MG/ML IJ SOLN
1.0000 mg | INTRAMUSCULAR | Status: DC | PRN
  Administered 2014-10-05 (×2): 1 mg via INTRAMUSCULAR
  Filled 2014-10-05 (×2): qty 1

## 2014-10-05 MED ORDER — MOMETASONE FURO-FORMOTEROL FUM 100-5 MCG/ACT IN AERO
2.0000 | INHALATION_SPRAY | Freq: Two times a day (BID) | RESPIRATORY_TRACT | Status: DC
  Administered 2014-10-05 – 2014-10-06 (×2): 2 via RESPIRATORY_TRACT
  Filled 2014-10-05: qty 8.8

## 2014-10-05 MED ORDER — ARTIFICIAL TEARS OP OINT
TOPICAL_OINTMENT | OPHTHALMIC | Status: DC | PRN
  Administered 2014-10-05: 1 via OPHTHALMIC

## 2014-10-05 MED ORDER — ALBUTEROL SULFATE (2.5 MG/3ML) 0.083% IN NEBU: 2.5000 mg | INHALATION_SOLUTION | Freq: Four times a day (QID) | RESPIRATORY_TRACT | Status: DC | PRN

## 2014-10-05 MED ORDER — SODIUM CHLORIDE 0.9 % IV SOLN: 250.0000 mL | INTRAVENOUS | Status: DC

## 2014-10-05 MED ORDER — ONDANSETRON HCL 4 MG PO TABS
4.0000 mg | ORAL_TABLET | ORAL | Status: DC | PRN
  Administered 2014-10-05 – 2014-10-06 (×2): 4 mg via ORAL
  Filled 2014-10-05: qty 1

## 2014-10-05 MED ORDER — PHENYLEPHRINE HCL 10 MG/ML IJ SOLN
INTRAMUSCULAR | Status: DC | PRN
  Administered 2014-10-05: 80 ug via INTRAVENOUS

## 2014-10-05 MED ORDER — BACITRACIN 50000 UNITS IM SOLR
INTRAMUSCULAR | Status: DC | PRN
  Administered 2014-10-05: 500 mL

## 2014-10-05 MED ORDER — SUCCINYLCHOLINE CHLORIDE 20 MG/ML IJ SOLN
INTRAMUSCULAR | Status: DC | PRN
  Administered 2014-10-05: 100 mg via INTRAVENOUS

## 2014-10-05 MED ORDER — FUROSEMIDE 20 MG PO TABS
20.0000 mg | ORAL_TABLET | Freq: Every day | ORAL | Status: DC
  Administered 2014-10-06: 20 mg via ORAL
  Filled 2014-10-05: qty 1

## 2014-10-05 MED ORDER — LIDOCAINE HCL (CARDIAC) 20 MG/ML IV SOLN
INTRAVENOUS | Status: DC | PRN
  Administered 2014-10-05: 80 mg via INTRAVENOUS

## 2014-10-05 MED ORDER — FENTANYL CITRATE (PF) 100 MCG/2ML IJ SOLN
INTRAMUSCULAR | Status: DC | PRN
Start: 2014-10-05 — End: 2014-10-05
  Administered 2014-10-05: 25 ug via INTRAVENOUS
  Administered 2014-10-05: 75 ug via INTRAVENOUS
  Administered 2014-10-05: 100 ug via INTRAVENOUS
  Administered 2014-10-05: 50 ug via INTRAVENOUS

## 2014-10-05 MED ORDER — PANTOPRAZOLE SODIUM 40 MG IV SOLR: 40.0000 mg | Freq: Every day | INTRAVENOUS | Status: DC

## 2014-10-05 MED ORDER — LATANOPROST 0.005 % OP SOLN
1.0000 [drp] | Freq: Every day | OPHTHALMIC | Status: DC
  Administered 2014-10-05: 1 [drp] via OPHTHALMIC
  Filled 2014-10-05: qty 2.5

## 2014-10-05 MED ORDER — DEXAMETHASONE SODIUM PHOSPHATE 10 MG/ML IJ SOLN
10.0000 mg | INTRAMUSCULAR | Status: AC
  Administered 2014-10-05: 10 mg via INTRAVENOUS

## 2014-10-05 MED ORDER — EPHEDRINE SULFATE 50 MG/ML IJ SOLN
INTRAMUSCULAR | Status: AC
  Filled 2014-10-05: qty 1

## 2014-10-05 MED ORDER — MENTHOL 3 MG MT LOZG
1.0000 | LOZENGE | OROMUCOSAL | Status: DC | PRN
  Filled 2014-10-05: qty 9

## 2014-10-05 MED ORDER — ACETAMINOPHEN 650 MG RE SUPP: 650.0000 mg | RECTAL | Status: DC | PRN

## 2014-10-05 MED ORDER — CEFAZOLIN SODIUM-DEXTROSE 2-3 GM-% IV SOLR
INTRAVENOUS | Status: AC
  Filled 2014-10-05: qty 50

## 2014-10-05 MED ORDER — HEMOSTATIC AGENTS (NO CHARGE) OPTIME
TOPICAL | Status: DC | PRN
  Administered 2014-10-05: 1 via TOPICAL

## 2014-10-05 MED ORDER — CEFAZOLIN SODIUM-DEXTROSE 2-3 GM-% IV SOLR: 2.0000 g | INTRAVENOUS | Status: DC

## 2014-10-05 MED ORDER — DEXMEDETOMIDINE HCL 200 MCG/2ML IV SOLN
INTRAVENOUS | Status: DC | PRN
  Administered 2014-10-05 (×4): 8 ug via INTRAVENOUS

## 2014-10-05 MED ORDER — ALUM & MAG HYDROXIDE-SIMETH 200-200-20 MG/5ML PO SUSP: 30.0000 mL | Freq: Four times a day (QID) | ORAL | Status: DC | PRN

## 2014-10-05 MED ORDER — FENTANYL CITRATE (PF) 250 MCG/5ML IJ SOLN
INTRAMUSCULAR | Status: AC
  Filled 2014-10-05: qty 5

## 2014-10-05 MED ORDER — SUCCINYLCHOLINE CHLORIDE 20 MG/ML IJ SOLN
INTRAMUSCULAR | Status: AC
  Filled 2014-10-05: qty 1

## 2014-10-05 MED ORDER — MIDAZOLAM HCL 5 MG/5ML IJ SOLN
INTRAMUSCULAR | Status: DC | PRN
  Administered 2014-10-05: 2 mg via INTRAVENOUS

## 2014-10-05 MED ORDER — ACETAMINOPHEN 325 MG PO TABS: 650.0000 mg | ORAL_TABLET | ORAL | Status: DC | PRN

## 2014-10-05 MED ORDER — SODIUM CHLORIDE 0.9 % IJ SOLN
3.0000 mL | Freq: Two times a day (BID) | INTRAMUSCULAR | Status: DC
  Administered 2014-10-05: 3 mL via INTRAVENOUS

## 2014-10-05 MED ORDER — SODIUM CHLORIDE 0.9 % IJ SOLN: 3.0000 mL | INTRAMUSCULAR | Status: DC | PRN

## 2014-10-05 MED ORDER — DEXAMETHASONE 4 MG PO TABS
4.0000 mg | ORAL_TABLET | Freq: Four times a day (QID) | ORAL | Status: AC
  Administered 2014-10-05: 4 mg via ORAL
  Filled 2014-10-05: qty 1

## 2014-10-05 MED ORDER — PANTOPRAZOLE SODIUM 40 MG PO TBEC
40.0000 mg | DELAYED_RELEASE_TABLET | Freq: Every day | ORAL | Status: DC
  Administered 2014-10-05 – 2014-10-06 (×2): 40 mg via ORAL
  Filled 2014-10-05 (×2): qty 1

## 2014-10-05 MED ORDER — THROMBIN 5000 UNITS EX SOLR
CUTANEOUS | Status: DC | PRN
  Administered 2014-10-05 (×2): 5000 [IU] via TOPICAL

## 2014-10-05 MED ORDER — KCL IN DEXTROSE-NACL 20-5-0.45 MEQ/L-%-% IV SOLN
80.0000 mL/h | INTRAVENOUS | Status: DC
  Filled 2014-10-05 (×3): qty 1000

## 2014-10-05 MED ORDER — PROPOFOL 10 MG/ML IV BOLUS
INTRAVENOUS | Status: AC
  Filled 2014-10-05: qty 20

## 2014-10-05 MED ORDER — BENAZEPRIL HCL 20 MG PO TABS
20.0000 mg | ORAL_TABLET | Freq: Every day | ORAL | Status: DC
  Administered 2014-10-06: 20 mg via ORAL
  Filled 2014-10-05: qty 1

## 2014-10-05 MED ORDER — HYDROCODONE-ACETAMINOPHEN 5-325 MG PO TABS
1.0000 | ORAL_TABLET | ORAL | Status: DC | PRN
  Administered 2014-10-06: 2 via ORAL
  Filled 2014-10-05: qty 2

## 2014-10-05 MED ORDER — ONDANSETRON HCL 4 MG/2ML IJ SOLN
4.0000 mg | INTRAMUSCULAR | Status: DC | PRN
  Administered 2014-10-05: 4 mg via INTRAVENOUS
  Filled 2014-10-05: qty 2

## 2014-10-05 MED ORDER — INFLUENZA VAC SPLIT QUAD 0.5 ML IM SUSY
0.5000 mL | PREFILLED_SYRINGE | INTRAMUSCULAR | Status: AC
  Administered 2014-10-06: 0.5 mL via INTRAMUSCULAR
  Filled 2014-10-05: qty 0.5

## 2014-10-05 MED ORDER — LIDOCAINE HCL (CARDIAC) 20 MG/ML IV SOLN
INTRAVENOUS | Status: AC
  Filled 2014-10-05: qty 5

## 2014-10-05 MED ORDER — ARTIFICIAL TEARS OP OINT
TOPICAL_OINTMENT | OPHTHALMIC | Status: AC
  Filled 2014-10-05: qty 3.5

## 2014-10-05 MED ORDER — METHOCARBAMOL 500 MG PO TABS
500.0000 mg | ORAL_TABLET | Freq: Four times a day (QID) | ORAL | Status: DC | PRN
  Administered 2014-10-05: 500 mg via ORAL
  Filled 2014-10-05: qty 1

## 2014-10-05 MED ORDER — DEXMEDETOMIDINE HCL IN NACL 200 MCG/50ML IV SOLN
INTRAVENOUS | Status: AC
  Filled 2014-10-05: qty 50

## 2014-10-05 MED ORDER — HYDROMORPHONE HCL 1 MG/ML IJ SOLN
INTRAMUSCULAR | Status: AC
  Filled 2014-10-05: qty 1

## 2014-10-05 MED ORDER — DEXAMETHASONE SODIUM PHOSPHATE 10 MG/ML IJ SOLN
INTRAMUSCULAR | Status: AC
  Filled 2014-10-05: qty 1

## 2014-10-05 MED ORDER — LACTATED RINGERS IV SOLN
INTRAVENOUS | Status: DC | PRN
  Administered 2014-10-05 (×2): via INTRAVENOUS

## 2014-10-05 MED ORDER — HYDROMORPHONE HCL 1 MG/ML IJ SOLN
0.2500 mg | INTRAMUSCULAR | Status: DC | PRN
  Administered 2014-10-05 (×2): 0.5 mg via INTRAVENOUS

## 2014-10-05 MED ORDER — DOCUSATE SODIUM 100 MG PO CAPS
100.0000 mg | ORAL_CAPSULE | Freq: Two times a day (BID) | ORAL | Status: DC
  Administered 2014-10-05 – 2014-10-06 (×2): 100 mg via ORAL
  Filled 2014-10-05 (×2): qty 1

## 2014-10-05 MED ORDER — METHOCARBAMOL 1000 MG/10ML IJ SOLN
500.0000 mg | Freq: Four times a day (QID) | INTRAVENOUS | Status: DC | PRN
  Filled 2014-10-05: qty 5

## 2014-10-05 MED ORDER — GABAPENTIN 800 MG PO TABS
800.0000 mg | ORAL_TABLET | Freq: Every day | ORAL | Status: DC
  Administered 2014-10-05: 800 mg via ORAL
  Filled 2014-10-05 (×2): qty 1

## 2014-10-05 MED ORDER — SODIUM CHLORIDE 0.9 % IJ SOLN
INTRAMUSCULAR | Status: AC
  Filled 2014-10-05: qty 10

## 2014-10-05 MED ORDER — IPRATROPIUM BROMIDE 0.02 % IN SOLN
0.5000 mg | RESPIRATORY_TRACT | Status: DC | PRN
  Filled 2014-10-05: qty 2.5

## 2014-10-05 MED ORDER — ALBUTEROL SULFATE HFA 108 (90 BASE) MCG/ACT IN AERS: 2.0000 | INHALATION_SPRAY | Freq: Four times a day (QID) | RESPIRATORY_TRACT | Status: DC | PRN

## 2014-10-05 MED ORDER — PROPOFOL 10 MG/ML IV BOLUS
INTRAVENOUS | Status: DC | PRN
  Administered 2014-10-05: 150 mg via INTRAVENOUS

## 2014-10-05 MED ORDER — ROCURONIUM BROMIDE 100 MG/10ML IV SOLN
INTRAVENOUS | Status: DC | PRN
  Administered 2014-10-05: 30 mg via INTRAVENOUS
  Administered 2014-10-05: 10 mg via INTRAVENOUS

## 2014-10-05 MED ORDER — DEXAMETHASONE SODIUM PHOSPHATE 4 MG/ML IJ SOLN
4.0000 mg | Freq: Four times a day (QID) | INTRAMUSCULAR | Status: AC
  Administered 2014-10-05: 4 mg via INTRAVENOUS
  Filled 2014-10-05: qty 1

## 2014-10-05 MED ORDER — PHENYLEPHRINE 40 MCG/ML (10ML) SYRINGE FOR IV PUSH (FOR BLOOD PRESSURE SUPPORT)
PREFILLED_SYRINGE | INTRAVENOUS | Status: AC
  Filled 2014-10-05: qty 10

## 2014-10-05 MED ORDER — BISACODYL 5 MG PO TBEC
5.0000 mg | DELAYED_RELEASE_TABLET | Freq: Every day | ORAL | Status: DC | PRN
  Filled 2014-10-05: qty 1

## 2014-10-05 MED ORDER — SUGAMMADEX SODIUM 200 MG/2ML IV SOLN
INTRAVENOUS | Status: DC | PRN
  Administered 2014-10-05: 173.2 mg via INTRAVENOUS

## 2014-10-05 MED ORDER — THROMBIN 5000 UNITS EX SOLR
OROMUCOSAL | Status: DC | PRN
  Administered 2014-10-05: 10 mL via TOPICAL

## 2014-10-05 MED ORDER — DIVALPROEX SODIUM ER 500 MG PO TB24
500.0000 mg | ORAL_TABLET | ORAL | Status: DC | PRN
  Filled 2014-10-05: qty 1

## 2014-10-05 MED ORDER — IMIPRAMINE HCL 50 MG PO TABS
50.0000 mg | ORAL_TABLET | Freq: Every day | ORAL | Status: DC
  Administered 2014-10-05: 50 mg via ORAL
  Filled 2014-10-05 (×2): qty 1

## 2014-10-05 MED ORDER — MIDAZOLAM HCL 2 MG/2ML IJ SOLN
INTRAMUSCULAR | Status: AC
  Filled 2014-10-05: qty 4

## 2014-10-05 MED ORDER — CEFAZOLIN SODIUM-DEXTROSE 2-3 GM-% IV SOLR
INTRAVENOUS | Status: AC
  Administered 2014-10-05: 2 g via INTRAVENOUS
  Filled 2014-10-05: qty 50

## 2014-10-05 MED ORDER — ONDANSETRON HCL 4 MG/2ML IJ SOLN
INTRAMUSCULAR | Status: AC
  Filled 2014-10-05: qty 2

## 2014-10-05 MED ORDER — CEFAZOLIN SODIUM-DEXTROSE 2-3 GM-% IV SOLR
2.0000 g | Freq: Three times a day (TID) | INTRAVENOUS | Status: AC
  Filled 2014-10-05 (×2): qty 50

## 2014-10-05 MED ORDER — PROMETHAZINE HCL 25 MG/ML IJ SOLN: 6.2500 mg | INTRAMUSCULAR | Status: DC | PRN

## 2014-10-05 MED ORDER — 0.9 % SODIUM CHLORIDE (POUR BTL) OPTIME
TOPICAL | Status: DC | PRN
  Administered 2014-10-05: 1000 mL

## 2014-10-05 MED ORDER — ONDANSETRON HCL 4 MG PO TABS
4.0000 mg | ORAL_TABLET | Freq: Three times a day (TID) | ORAL | Status: DC | PRN
  Administered 2014-10-05: 4 mg via ORAL
  Filled 2014-10-05 (×2): qty 1

## 2014-10-05 MED ORDER — ROCURONIUM BROMIDE 50 MG/5ML IV SOLN
INTRAVENOUS | Status: AC
  Filled 2014-10-05: qty 1

## 2014-10-05 MED ORDER — SUGAMMADEX SODIUM 200 MG/2ML IV SOLN
INTRAVENOUS | Status: AC
  Filled 2014-10-05: qty 2

## 2014-10-05 MED ORDER — PHENOL 1.4 % MT LIQD
1.0000 | OROMUCOSAL | Status: DC | PRN
  Administered 2014-10-05: 1 via OROMUCOSAL
  Filled 2014-10-05: qty 177

## 2014-10-05 MED ORDER — ONDANSETRON HCL 4 MG/2ML IJ SOLN
INTRAMUSCULAR | Status: DC | PRN
  Administered 2014-10-05: 4 mg via INTRAVENOUS

## 2014-10-05 SURGICAL SUPPLY — 58 items
APL SKNCLS STERI-STRIP NONHPOA (GAUZE/BANDAGES/DRESSINGS) ×1
BAG DECANTER FOR FLEXI CONT (MISCELLANEOUS) ×2 IMPLANT
BENZOIN TINCTURE PRP APPL 2/3 (GAUZE/BANDAGES/DRESSINGS) ×4 IMPLANT
BIT DRILL TRINICA 2.3MM (BIT) IMPLANT
BRUSH SCRUB EZ PLAIN DRY (MISCELLANEOUS) ×2 IMPLANT
BUR MATCHSTICK NEURO 3.0 LAGG (BURR) ×2 IMPLANT
CANISTER SUCT 3000ML PPV (MISCELLANEOUS) ×2 IMPLANT
DRAPE C-ARM 42X72 X-RAY (DRAPES) ×4 IMPLANT
DRAPE LAPAROTOMY 100X72 PEDS (DRAPES) ×2 IMPLANT
DRAPE MICROSCOPE LEICA (MISCELLANEOUS) ×2 IMPLANT
DRAPE SURG 17X23 STRL (DRAPES) ×4 IMPLANT
DRILL BIT TRINICA 2.3MM (BIT) ×2
DURAPREP 6ML APPLICATOR 50/CS (WOUND CARE) ×2 IMPLANT
ELECT COATED BLADE 2.86 ST (ELECTRODE) ×2 IMPLANT
ELECT REM PT RETURN 9FT ADLT (ELECTROSURGICAL) ×2
ELECTRODE REM PT RTRN 9FT ADLT (ELECTROSURGICAL) ×1 IMPLANT
GAUZE SPONGE 4X4 12PLY STRL (GAUZE/BANDAGES/DRESSINGS) ×2 IMPLANT
GAUZE SPONGE 4X4 16PLY XRAY LF (GAUZE/BANDAGES/DRESSINGS) IMPLANT
GLOVE BIOGEL PI IND STRL 8 (GLOVE) IMPLANT
GLOVE BIOGEL PI INDICATOR 8 (GLOVE) ×2
GLOVE ECLIPSE 6.5 STRL STRAW (GLOVE) ×1 IMPLANT
GLOVE ECLIPSE 8.0 STRL XLNG CF (GLOVE) ×2 IMPLANT
GLOVE EXAM NITRILE LRG STRL (GLOVE) IMPLANT
GLOVE EXAM NITRILE MD LF STRL (GLOVE) ×2 IMPLANT
GLOVE EXAM NITRILE XL STR (GLOVE) IMPLANT
GLOVE EXAM NITRILE XS STR PU (GLOVE) IMPLANT
GLOVE SS N UNI LF 8.0 STRL (GLOVE) ×3 IMPLANT
GOWN STRL REUS W/ TWL LRG LVL3 (GOWN DISPOSABLE) IMPLANT
GOWN STRL REUS W/ TWL XL LVL3 (GOWN DISPOSABLE) IMPLANT
GOWN STRL REUS W/TWL 2XL LVL3 (GOWN DISPOSABLE) ×1 IMPLANT
GOWN STRL REUS W/TWL LRG LVL3 (GOWN DISPOSABLE) ×2
GOWN STRL REUS W/TWL XL LVL3 (GOWN DISPOSABLE) ×2
HALTER HD/CHIN CERV TRACTION D (MISCELLANEOUS) ×2 IMPLANT
INTERBODY TM 11X14X5-7DEG ANG (Metal Cage) ×1 IMPLANT
KIT BASIN OR (CUSTOM PROCEDURE TRAY) ×2 IMPLANT
KIT ROOM TURNOVER OR (KITS) ×2 IMPLANT
NDL SPNL 20GX3.5 QUINCKE YW (NEEDLE) ×1 IMPLANT
NEEDLE SPNL 20GX3.5 QUINCKE YW (NEEDLE) ×2 IMPLANT
NS IRRIG 1000ML POUR BTL (IV SOLUTION) ×2 IMPLANT
PACK LAMINECTOMY NEURO (CUSTOM PROCEDURE TRAY) ×2 IMPLANT
PAD ARMBOARD 7.5X6 YLW CONV (MISCELLANEOUS) ×2 IMPLANT
PATTIES SURGICAL .75X.75 (GAUZE/BANDAGES/DRESSINGS) ×2 IMPLANT
PLATE 38MM (Plate) ×1 IMPLANT
PUTTY BONE GRAFT KIT 2.5ML (Bone Implant) ×1 IMPLANT
RUBBERBAND STERILE (MISCELLANEOUS) ×4 IMPLANT
SCREW SD FIXED 12MM (Screw) ×4 IMPLANT
SCREW SELF DRILL VAR 12MM (Screw) ×1 IMPLANT
SPACER TMS 11X14X6MM (Spacer) ×1 IMPLANT
SPONGE INTESTINAL PEANUT (DISPOSABLE) ×2 IMPLANT
SPONGE SURGIFOAM ABS GEL SZ50 (HEMOSTASIS) ×2 IMPLANT
STRIP CLOSURE SKIN 1/2X4 (GAUZE/BANDAGES/DRESSINGS) ×2 IMPLANT
SUT PDS AB 5-0 P3 18 (SUTURE) ×2 IMPLANT
SUT VIC AB 3-0 CP2 18 (SUTURE) ×2 IMPLANT
TAPE STRIPS DRAPE STRL (GAUZE/BANDAGES/DRESSINGS) ×1 IMPLANT
TOWEL OR 17X24 6PK STRL BLUE (TOWEL DISPOSABLE) ×2 IMPLANT
TOWEL OR 17X26 10 PK STRL BLUE (TOWEL DISPOSABLE) ×2 IMPLANT
TRAP SPECIMEN MUCOUS 40CC (MISCELLANEOUS) IMPLANT
WATER STERILE IRR 1000ML POUR (IV SOLUTION) ×2 IMPLANT

## 2014-10-05 NOTE — H&P (Signed)
Mariah Henderson is an 64 y.o. female.   Chief Complaint: Left arm pain HPI: The patient is a 64 year old female who is evaluated in the office for left arm pain with numbness and tingling. The splint on for few months with no inciting event. She went was emergency room where an MRI scan was done she was referred for evaluation. She's tried medications without relief. Her [place] Cage was asymptomatic. Her MRI scan was reviewed and showed multiple levels, spondylosis with particular left-sided foraminal encroachment at C5-6 and C6-7. She was tried an additional conservative therapy with anti-inflammatory medication this still gave her no relief. The options were discussed at that time she requested surgery now comes for a two-level anterior cervical discectomy fusion and plating. I had a long discussion with her regarding the risks and benefits of surgical intervention. The risks discussed include but are not limited to bleeding infection weakness some as paralysis spinal fluid leak coma quadriplegia hoarseness and death. We have discussed alternative methods of therapy along with the risks and benefits of nonintervention. She's had the opportunity to ask numerous questions and appears to understand. With this information in hand she has requested we proceed with surgery.  Past Medical History  Diagnosis Date  . Hypertension   . Hyperlipidemia   . Low back pain   . Cervical spondylosis   . Obesity   . Depression     followed by [name]; no longer of depakote, seroquel  . Chronic serous otitis media   . Foot drop   . Constipation   . Paraumbilical hernia   . Asthma   . COPD (chronic obstructive pulmonary disease)   . Anxiety   . GERD (gastroesophageal reflux disease)   . Heart murmur   . Diabetes mellitus     Diet controlled    Past Surgical History  Procedure Laterality Date  . Cholecystectomy    . Foot surgery    . Colonoscopy    . Abdominal hysterectomy    . Radioactive seed guided  excisional breast biopsy Left 06/23/2014    Procedure: RADIOACTIVE SEED GUIDED EXCISIONAL BREAST BIOPSY;  Surgeon: Stark Klein, MD;  Location: [facility];  Service: General;  Laterality: Left;  . Hernia repair  1999    Family History  Problem Relation Age of Onset  . Colon cancer Neg Hx    Social History:  reports that she quit smoking about 18 years ago. Her smoking use included Cigarettes. She has never used smokeless tobacco. She reports that she does not drink alcohol or use illicit drugs.  Allergies: No Known Allergies  Medications Prior to Admission  Medication Sig Dispense Refill  . albuterol (PROVENTIL HFA;VENTOLIN HFA) 108 (90 BASE) MCG/ACT inhaler Inhale 2 puffs into the lungs every 6 (six) hours as needed for wheezing. 3 Inhaler 3  . aspirin (ASPIR-81) 81 MG EC tablet Take 81 mg by mouth daily.      Marland Kitchen atorvastatin (LIPITOR) 40 MG tablet take 1 tablet by mouth once daily 90 tablet 3  . benazepril (LOTENSIN) 20 MG tablet Take 1 tablet (20 mg total) by mouth daily. 90 tablet 3  . Cholecalciferol (VITAMIN D3) 1000 UNITS CAPS Take 2 tablets by mouth daily.      Marland Kitchen DEXILANT 60 MG capsule Take 60 mg by mouth daily.   1  . divalproex (DEPAKOTE ER) 500 MG 24 hr tablet Take 500 mg by mouth as needed (reports that she only takes as needed for depression).     Marland Kitchen  fluticasone (FLONASE) 50 MCG/ACT nasal spray instill 1 spray into each nostril once daily 16 g 11  . Fluticasone-Salmeterol (ADVAIR DISKUS) 100-50 MCG/DOSE AEPB Inhale 1 puff into the lungs 2 (two) times daily. 60 each 6  . furosemide (LASIX) 20 MG tablet Take 1 tablet (20 mg total) by mouth daily. 30 tablet 3  . gabapentin (NEURONTIN) 800 MG tablet Take 800 mg by mouth at bedtime.   0  . guaiFENesin-dextromethorphan (ROBITUSSIN DM) 100-10 MG/5ML syrup Take 10 mLs by mouth every 4 (four) hours as needed for cough. 118 mL 0  . imipramine (TOFRANIL) 50 MG tablet Take 50 mg by mouth at bedtime.     Marland Kitchen latanoprost (XALATAN)  0.005 % ophthalmic solution Place 1 drop into both eyes at bedtime.     Marland Kitchen loratadine (CLARITIN) 10 MG tablet take 1 tablet by mouth once daily 30 tablet 1  . triamcinolone cream (KENALOG) 0.1 % Apply topically 2 (two) times daily. 30 g 1  . zolpidem (AMBIEN) 5 MG tablet Take 5 mg by mouth at bedtime.   0  . acetaminophen (TYLENOL) 500 MG tablet Take 1 tablet (500 mg total) by mouth every 4 (four) hours as needed. Do not take more than 6 times per day (Patient taking differently: Take 500 mg by mouth every 4 (four) hours as needed for mild pain or moderate pain. Do not take more than 6 times per day) 120 tablet 0  . albuterol (PROVENTIL) (2.5 MG/3ML) 0.083% nebulizer solution Take 3 mLs (2.5 mg total) by nebulization every 6 (six) hours as needed for wheezing. 1080 mL 3  . hydrochlorothiazide (HYDRODIURIL) 25 MG tablet Take 1 tablet (25 mg total) by mouth daily. (Patient not taking: Reported on 09/24/2014) 90 tablet 3  . ibuprofen (ADVIL,MOTRIN) 800 MG tablet Take 1 tablet (800 mg total) by mouth 3 (three) times daily. (Patient not taking: Reported on 09/24/2014) 21 tablet 0  . ipratropium (ATROVENT) 0.02 % nebulizer solution Take 2.5 mLs (0.5 mg total) by nebulization every 4 (four) hours as needed for wheezing. 75 mL 6  . meloxicam (MOBIC) 7.5 MG tablet Take 1 tablet (7.5 mg total) by mouth daily. (Patient not taking: Reported on 09/24/2014) 30 tablet 1    Results for orders placed or performed during the hospital encounter of 10/05/14 (from the past 48 hour(s))  Glucose, capillary     Status: Abnormal   Collection Time: 10/05/14  6:35 AM  Result Value Ref Range   Glucose-Capillary 117 (H) 65 - 99 mg/dL   No results found.  Positive for hearing loss sinus disease asthma and anxiety  Blood pressure 147/68, pulse 89, temperature 98.4 F (36.9 C), temperature source Oral, resp. rate 20, height 5' (1.524 m), weight 86.637 kg (191 lb), last menstrual period 04/29/1966, SpO2 100 %.  The patient is  awake or and oriented. She is no facial asymmetry. Her gait is nonantalgic. She has mild weakness of the left triceps muscle over sensation is intact Assessment/Plan Impression is that of spondylosis with nerve compression at C5-6 and C6-7. The plan is for a two-level anterior cervical discectomy with fusion and plating.  Faythe Ghee, MD 10/05/2014, 7:28 AM

## 2014-10-05 NOTE — Transfer of Care (Signed)
Immediate Anesthesia Transfer of Care Note  Patient: Mariah Henderson  Procedure(s) Performed: Procedure(s) with comments: ACDF - C5-C6 - C6-C7 (N/A) - ACDF - C5-C6 - C6-C7  Patient Location: PACU  Anesthesia Type:General  Level of Consciousness: awake, alert  and patient cooperative  Airway & Oxygen Therapy: Patient Spontanous Breathing and Patient connected to face mask oxygen  Post-op Assessment: Report given to RN and Post -op Vital signs reviewed and stable  Post vital signs: Reviewed and stable, Pt moves all extrem  Last Vitals:  Filed Vitals:   10/05/14 0631  BP: 147/68  Pulse: 89  Temp: 36.9 C  Resp: 20    Complications: No apparent anesthesia complications

## 2014-10-05 NOTE — Op Note (Signed)
Preop diagnosis: Spondylosis C5-6 C6-7 Postop diagnosis: Same Procedure: C5-6 C6-7 decompressive anterior cervical discectomy with trabecular metal interbody fusion and Trinica anterior cervical plating Surgeon: Tery Hoeger Asst.: Cabbell  After and placed the supine position and 10 pounds halter traction the patient's neck was prepped and draped in the usual sterile fashion. Localizing fluoroscopy was used prior to incision to identify the appropriate level. Transverse incision was made in the right anterior neck started the midline and headed towards the medial aspect of the sternal cleidomastoid muscle. The platysma muscle was then incised the natural fascial plane between the strap muscles medially and the sternal cremaster laterally was identified and followed down to the anterior aspect the cervical spine. Longus Cole muscles were identified and split in the midline and stripped away bilaterally with unipolar coagulation and Kitner dissection. Self care tract was placed for exposure Dr. should approach the appropriate levels. Using a 15 blade the S the disc at C5-6 and C6-7 was incised. Using pituitary rongeurs and curettes a thorough disc space cleanout was carried out. High-speed drill was used to widen the interspace and bony shavings were saved for use later in the case. Microscope was draped brought into the field and used for the remainder of the case. Using microsecond technique the remainder of the disc material both levels down the posterior longitudinal ligament was removed. We was then removed both levels and thorough decompression was carried out on the spinal dura into the foramen bilaterally until the C6 and C7 nerve roots well visualized well decompressed. At this time we inspected all directions for any evidence of residual compression and none could be identified. Irrigation was carried out and any bleeding control proper coagulation Gelfoam. Measurements were taken and one 5 and one 6  number graft were chosen and filled with a mixture of autologous bone and morselized allograft. The small graft was then impacted C6-7 the larger graft at C5-6. Fossae showed them to be in good position. An appropriately length Trinica anterior cervical plate was then chosen. Under fluoroscopic guidance drill holes were placed followed by placing of 12 mm screws 5. Once could not get good purchase and see 6 we chose not to place one there. We irrigated copiously controlled any bleeding with upper coagulation Gelfoam. The was then closed with inverted Vicryls on the platysma muscle and subcuticular layer. Steri-Strips were placed on the skin. Shortness was then applied and the patient was extubated and taken to recovery room in stable condition.

## 2014-10-05 NOTE — Progress Notes (Signed)
Utilization review completed.  

## 2014-10-05 NOTE — Anesthesia Preprocedure Evaluation (Addendum)
Anesthesia Evaluation  Patient identified by MRN, date of birth, ID band Patient awake    Reviewed: Allergy & Precautions, NPO status , Patient's Chart, lab work & pertinent test results  Airway Mallampati: II  TM Distance: >3 FB Neck ROM: Full    Dental  (+) Edentulous Upper   Pulmonary asthma , COPD, former smoker,    breath sounds clear to auscultation       Cardiovascular hypertension,  Rhythm:Regular Rate:Normal     Neuro/Psych    GI/Hepatic GERD  ,  Endo/Other  diabetesMorbid obesity  Renal/GU      Musculoskeletal  (+) Arthritis ,   Abdominal (+) + obese,   Peds  Hematology   Anesthesia Other Findings   Reproductive/Obstetrics                            Anesthesia Physical Anesthesia Plan  ASA: III  Anesthesia Plan: General   Post-op Pain Management:    Induction: Intravenous  Airway Management Planned: Oral ETT  Additional Equipment:   Intra-op Plan:   Post-operative Plan: Extubation in OR  Informed Consent: I have reviewed the patients History and Physical, chart, labs and discussed the procedure including the risks, benefits and alternatives for the proposed anesthesia with the patient or authorized representative who has indicated his/her understanding and acceptance.     Plan Discussed with:   Anesthesia Plan Comments:         Anesthesia Quick Evaluation

## 2014-10-05 NOTE — Anesthesia Postprocedure Evaluation (Signed)
  Anesthesia Post-op Note  Patient: Mariah Henderson  Procedure(s) Performed: Procedure(s) with comments: ACDF - C5-C6 - C6-C7 (N/A) - ACDF - C5-C6 - C6-C7  Patient Location: PACU  Anesthesia Type:General  Level of Consciousness: awake and alert   Airway and Oxygen Therapy: Patient Spontanous Breathing  Post-op Pain: mild  Post-op Assessment: Post-op Vital signs reviewed              Post-op Vital Signs: Reviewed  Last Vitals:  Filed Vitals:   10/05/14 1100  BP: 145/69  Pulse: 90  Temp:   Resp: 21    Complications: No apparent anesthesia complications

## 2014-10-05 NOTE — Anesthesia Procedure Notes (Signed)
Procedure Name: Intubation Date/Time: 10/05/2014 7:38 AM Performed by: Rogers Blocker Pre-anesthesia Checklist: Patient identified, Timeout performed, Emergency Drugs available, Suction available and Patient being monitored Patient Re-evaluated:Patient Re-evaluated prior to inductionOxygen Delivery Method: Circle system utilized Preoxygenation: Pre-oxygenation with 100% oxygen Intubation Type: IV induction Ventilation: Mask ventilation without difficulty Laryngoscope Size: Mac and 3 Grade View: Grade I Tube type: Oral Tube size: 7.5 mm Number of attempts: 1 Airway Equipment and Method: Stylet Placement Confirmation: ETT inserted through vocal cords under direct vision,  positive ETCO2,  CO2 detector and breath sounds checked- equal and bilateral Secured at: 21 cm Tube secured with: Tape Dental Injury: Teeth and Oropharynx as per pre-operative assessment

## 2014-10-06 ENCOUNTER — Other Ambulatory Visit: Payer: Self-pay | Admitting: Internal Medicine

## 2014-10-06 ENCOUNTER — Encounter (HOSPITAL_COMMUNITY): Payer: Self-pay | Admitting: Neurosurgery

## 2014-10-06 DIAGNOSIS — M4722 Other spondylosis with radiculopathy, cervical region: Secondary | ICD-10-CM | POA: Diagnosis not present

## 2014-10-06 LAB — GLUCOSE, CAPILLARY
Glucose-Capillary: 144 mg/dL — ABNORMAL HIGH (ref 65–99)
Glucose-Capillary: 198 mg/dL — ABNORMAL HIGH (ref 65–99)

## 2014-10-06 MED ORDER — HYDROCODONE-ACETAMINOPHEN 5-325 MG PO TABS: 1.0000 | ORAL_TABLET | ORAL | Status: DC | PRN

## 2014-10-06 MED ORDER — ONDANSETRON HCL 4 MG PO TABS
4.0000 mg | ORAL_TABLET | Freq: Once | ORAL | Status: AC
  Administered 2014-10-06: 4 mg via ORAL
  Filled 2014-10-06: qty 1

## 2014-10-06 NOTE — Discharge Summary (Signed)
Physician Discharge Summary  [name] MRN: [id number] DOB/AGE: [birth year] 64 y.o.  Admit date: 10/05/2014 Discharge date: 10/06/2014  Admission Diagnoses:  Discharge Diagnoses:  Active Problems:   Cervical spondylosis with radiculopathy   Discharged Condition: good  Hospital Course: Surgery yesterday for 2 level acdf. Did well with good improvement of her pre op pain. Wound clean and dry. Ambulated well. Home pod 1, specific instructions given.  Consults: None  Significant Diagnostic Studies: none  Treatments: surgery: C 56 C 67 acdf  Discharge Exam: Blood pressure 121/70, pulse 95, temperature 99 F (37.2 C), temperature source Oral, resp. rate 18, height 5' (1.524 m), weight 86.637 kg (191 lb), last menstrual period 04/29/1966, SpO2 99 %. Incision/Wound:clean and dry; no new neuro issues  Disposition: 01-Home or Self Care     Medication List    ASK your doctor about these medications        acetaminophen 500 MG tablet  Commonly known as:  TYLENOL  Take 1 tablet (500 mg total) by mouth every 4 (four) hours as needed. Do not take more than 6 times per day     albuterol (2.5 MG/3ML) 0.083% nebulizer solution  Commonly known as:  PROVENTIL  Take 3 mLs (2.5 mg total) by nebulization every 6 (six) hours as needed for wheezing.     albuterol 108 (90 BASE) MCG/ACT inhaler  Commonly known as:  PROVENTIL HFA;VENTOLIN HFA  Inhale 2 puffs into the lungs every 6 (six) hours as needed for wheezing.     ASPIR-81 81 MG EC tablet  Generic drug:  aspirin  Take 81 mg by mouth daily.     atorvastatin 40 MG tablet  Commonly known as:  LIPITOR  take 1 tablet by mouth once daily     benazepril 20 MG tablet  Commonly known as:  LOTENSIN  Take 1 tablet (20 mg total) by mouth daily.     DEXILANT 60 MG capsule  Generic drug:  dexlansoprazole  Take 60 mg by mouth daily.     divalproex 500 MG 24 hr tablet  Commonly known as:  DEPAKOTE ER  Take 500 mg by mouth  as needed (reports that she only takes as needed for depression).     fluticasone 50 MCG/ACT nasal spray  Commonly known as:  FLONASE  instill 1 spray into each nostril once daily     Fluticasone-Salmeterol 100-50 MCG/DOSE Aepb  Commonly known as:  ADVAIR DISKUS  Inhale 1 puff into the lungs 2 (two) times daily.     furosemide 20 MG tablet  Commonly known as:  LASIX  Take 1 tablet (20 mg total) by mouth daily.     gabapentin 800 MG tablet  Commonly known as:  NEURONTIN  Take 800 mg by mouth at bedtime.     guaiFENesin-dextromethorphan 100-10 MG/5ML syrup  Commonly known as:  ROBITUSSIN DM  Take 10 mLs by mouth every 4 (four) hours as needed for cough.     hydrochlorothiazide 25 MG tablet  Commonly known as:  HYDRODIURIL  Take 1 tablet (25 mg total) by mouth daily.     ibuprofen 800 MG tablet  Commonly known as:  ADVIL,MOTRIN  Take 1 tablet (800 mg total) by mouth 3 (three) times daily.     imipramine 50 MG tablet  Commonly known as:  TOFRANIL  Take 50 mg by mouth at bedtime.     ipratropium 0.02 % nebulizer solution  Commonly known as:  ATROVENT  Take 2.5 mLs (0.5 mg total)  by nebulization every 4 (four) hours as needed for wheezing.     latanoprost 0.005 % ophthalmic solution  Commonly known as:  XALATAN  Place 1 drop into both eyes at bedtime.     loratadine 10 MG tablet  Commonly known as:  CLARITIN  take 1 tablet by mouth once daily     meloxicam 7.5 MG tablet  Commonly known as:  MOBIC  Take 1 tablet (7.5 mg total) by mouth daily.     triamcinolone cream 0.1 %  Commonly known as:  KENALOG  Apply topically 2 (two) times daily.     Vitamin D3 1000 UNITS Caps  Take 2 tablets by mouth daily.     zolpidem 5 MG tablet  Commonly known as:  AMBIEN  Take 5 mg by mouth at bedtime.         At home rest most of the time. Get up 9 or 10 times each day and take a 15 or 20 minute walk. No riding in the car and to your first postoperative appointment. If you have  neck surgery you may shower from the chest down starting on the third postoperative day. If you had back surgery he may start showering on the third postoperative day with saran wrap wrapped around your incisional area 3 times. After the shower remove the saran wrap. Take pain medicine as needed and other medications as instructed. Call my office for an appointment.  SignedFaythe Ghee, MD 10/06/2014, 10:54 AM

## 2014-10-06 NOTE — Telephone Encounter (Signed)
Pt was referred to surgeon's office for nausea meds

## 2014-10-06 NOTE — Telephone Encounter (Signed)
Pt called requesting nausea medicine. Please call pt back.

## 2014-10-14 ENCOUNTER — Other Ambulatory Visit: Payer: Self-pay | Admitting: Internal Medicine

## 2014-10-15 ENCOUNTER — Telehealth: Payer: Self-pay | Admitting: Internal Medicine

## 2014-10-15 NOTE — Telephone Encounter (Signed)
Patient requesting tramadol refill

## 2014-10-15 NOTE — Telephone Encounter (Signed)
Pt wants tramadol refill, i do not see it on her medlist

## 2014-10-16 NOTE — Telephone Encounter (Signed)
Tried to call pt back, got vmail, lm for rtc

## 2014-10-16 NOTE — Telephone Encounter (Signed)
Agree.  I reviewed chart and [place] [facility] and she just got a [place] Rx filled from Dr. Hal Neer, neurosurgery on 9/20.  Can you please ask the patient if she means another medication?   Thanks

## 2014-10-19 NOTE — Telephone Encounter (Signed)
She states she is fine now she did not think about taking 2 pain medicines she will come to appt 11/2

## 2014-11-18 ENCOUNTER — Ambulatory Visit (INDEPENDENT_AMBULATORY_CARE_PROVIDER_SITE_OTHER): Payer: Medicare HMO | Admitting: Internal Medicine

## 2014-11-18 ENCOUNTER — Encounter: Payer: Self-pay | Admitting: Internal Medicine

## 2014-11-18 VITALS — BP 107/64 | HR 84 | Temp 98.2°F | Ht 60.0 in | Wt 185.9 lb

## 2014-11-18 DIAGNOSIS — N3090 Cystitis, unspecified without hematuria: Secondary | ICD-10-CM

## 2014-11-18 DIAGNOSIS — E1122 Type 2 diabetes mellitus with diabetic chronic kidney disease: Secondary | ICD-10-CM

## 2014-11-18 DIAGNOSIS — M4722 Other spondylosis with radiculopathy, cervical region: Secondary | ICD-10-CM | POA: Diagnosis not present

## 2014-11-18 DIAGNOSIS — H409 Unspecified glaucoma: Secondary | ICD-10-CM | POA: Diagnosis not present

## 2014-11-18 DIAGNOSIS — E119 Type 2 diabetes mellitus without complications: Secondary | ICD-10-CM

## 2014-11-18 DIAGNOSIS — I10 Essential (primary) hypertension: Secondary | ICD-10-CM

## 2014-11-18 LAB — GLUCOSE, CAPILLARY: Glucose-Capillary: 127 mg/dL — ABNORMAL HIGH (ref 65–99)

## 2014-11-18 NOTE — Patient Instructions (Signed)
General Instructions: Please schedule a follow up visit within the next 4 months.   For your medications:   Please bring all of your pill  Bottles with you to each visit.  This will help make sure that we have an up to date list of all the medications you are taking.  Please also bring any over the counter herbal medications you are taking (not including advil, tylenol, etc.)  Please continue taking all of your medications as prescribed.   Please call with any questions, and call the clinic to let me know if you have surgery again, and I will try to come see you in the hospital!  Thank you!    Treatment Goals:  Goals (1 Years of Data) as of 11/18/14          As of Today 10/06/14 10/06/14 10/06/14 10/05/14     Blood Pressure   . Blood Pressure < 140/90  107/64 113/61 121/70 113/56 108/85     Diet   . Eat 3 fruit servings each day           Result Component   . HEMOGLOBIN A1C < 7.0         . LDL CALC < 100            Progress Toward Treatment Goals:  Treatment Goal 07/22/2014  Hemoglobin A1C at goal  Blood pressure at goal    Self Care Goals & Plans:  Self Care Goal 11/18/2014  Manage my medications refill my medications on time; bring my medications to every visit; take my medicines as prescribed  Monitor my health keep track of my blood glucose; bring my glucose meter and log to each visit; keep track of my blood pressure; check my feet daily  Eat healthy foods eat more vegetables; eat foods that are low in salt; eat baked foods instead of fried foods  Be physically active find an activity I enjoy  Other -  Meeting treatment goals -    Home Blood Glucose Monitoring 11/21/2013  Check my blood sugar once a day  When to check my blood sugar before meals     Care Management & Community Referrals:  Referral 11/21/2013  Referrals made for care management support none needed  Referrals made to community resources none

## 2014-11-18 NOTE — Progress Notes (Signed)
   Subjective:    [name], female    DOB: 12/27/1950, 64 y.o.   MRN: [id number]  CC: Routine follow up for DM  HPI   Mariah Henderson is a 64yo woman with PMH of cervical spondylosis s/p surgical correction, HTN, well controlled DM, HLD, Glaucoma, reactive airway disease who presents for routine follow up.   She had recent surgery.  Still having numbness of the left hand, difficulty using it.  Now having problems swallowing after the surgery as they did the anterior approach.  She is able to get most foods downa nd water without issue.  She thinks she is still healing.  Sugars have been out of whack because she has been eating mostly starchy foods and not exercising.  She is very frustrated with the lack of results from the cervical fusion surgery.   Otherwise doing well, she is concerned that her A1C is rising.  She brought in her BS monitor which we reviewed.  Her BS are slightly higher than previous levels, but glucose average was 137.  I discussed this with her and noted that once she is able to get back on her exercise routine, things should return to normal.  I also encouraged her that her A1C is still very well controlled.    Recent evaluations for UTI and URTI.  All of these symptoms have resolved and she has no complaints at present.    She did not bring in her medications.  She is not smoking.   Review of Systems  Constitutional: Negative for fever and chills.  Eyes: Positive for visual disturbance. Negative for photophobia.       Occasional blurry vision due to glaucoma  Respiratory: Negative for cough and shortness of breath.   Cardiovascular: Negative for chest pain and leg swelling.  Gastrointestinal: Negative for nausea, diarrhea and constipation.  Genitourinary: Negative for dysuria, frequency and difficulty urinating.  Musculoskeletal: Positive for arthralgias and neck pain.  Skin: Positive for wound (healing on anterior right neck).  Neurological: Positive  for dizziness (occasional due to vertigo), weakness (left hand, chronic) and numbness (left hand). Negative for light-headedness and headaches.  Psychiatric/Behavioral: Positive for dysphoric mood (due to recent medical events). The patient is not nervous/anxious.        Objective:   Physical Exam  Constitutional: She is oriented to person, place, and time. She appears well-developed and well-nourished. No distress.  HENT:  Head: Normocephalic and atraumatic.  Mouth/Throat: No oropharyngeal exudate.  Wearing glasses  Eyes: Conjunctivae are normal. No scleral icterus.  Cardiovascular: Normal rate and regular rhythm.   Pulmonary/Chest: Effort normal and breath sounds normal. No respiratory distress.  Abdominal: Soft. There is no tenderness.  Musculoskeletal: She exhibits no edema or tenderness.  Neurological: She is alert and oriented to person, place, and time.  Reports numbness to light touch in left hand.  Reported weakness, but on exam grip strength is 5/5 bilaterally.    Skin:  Well healed right anterior neck scar, no erythema or tenderness to palpation  Psychiatric: She has a normal mood and affect. Her behavior is normal.    No labs today      Assessment & Plan:  RTC in 3-4 months, sooner if needed.

## 2014-11-19 NOTE — Assessment & Plan Note (Signed)
Lab Results  Component Value Date   HGBA1C 6.9* 09/24/2014   HGBA1C 6.5 07/22/2014   HGBA1C 6.6 04/29/2014     Assessment: Diabetes control: good control (HgbA1C at goal) Progress toward A1C goal:  at goal Comments: Continue current therapy  Plan: Medications:  diet and lifestyle Home glucose monitoring: Frequency:   Timing:   Instruction/counseling given: discussed diet Educational resources provided:   Self management tools provided: copy of home glucose meter download Other plans: Will continue to monitor BS log and see if she needs to initiate medical therapy at next visit.

## 2014-11-19 NOTE — Assessment & Plan Note (Signed)
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 It appears that they are evaluating her for carpal tunnel syndrome given no improvement with Cspine correction.  I will defer further discussion regarding this to the surgeon.

## 2014-11-19 NOTE — Assessment & Plan Note (Signed)
Seeing eye doctor regularly, continue opthalmic medications.

## 2014-11-19 NOTE — Assessment & Plan Note (Signed)
Symptoms resolved.  No further issues today.

## 2014-11-19 NOTE — Assessment & Plan Note (Signed)
BP Readings from Last 3 Encounters:  11/18/14 107/64  10/06/14 113/61  09/24/14 131/56    Lab Results  Component Value Date   NA 140 09/24/2014   K 3.9 09/24/2014   CREATININE 0.88 09/24/2014    Assessment: Blood pressure control: controlled Progress toward BP goal:  at goal Comments: Doing well  Plan: Medications:  continue current medications, benazapril, hctz, lasix Educational resources provided:   Self management tools provided:   Other plans: Check BMET at next visit.

## 2014-12-04 ENCOUNTER — Other Ambulatory Visit: Payer: Self-pay | Admitting: Internal Medicine

## 2014-12-16 ENCOUNTER — Other Ambulatory Visit: Payer: Self-pay | Admitting: Internal Medicine

## 2014-12-30 LAB — HM DIABETES EYE EXAM

## 2015-01-05 ENCOUNTER — Other Ambulatory Visit: Payer: Self-pay | Admitting: Internal Medicine

## 2015-01-05 NOTE — Telephone Encounter (Signed)
Pt requesting Claritin to be filled @ [facility] on [address].

## 2015-01-06 MED ORDER — LORATADINE 10 MG PO TABS: 10.0000 mg | ORAL_TABLET | Freq: Every day | ORAL | Status: DC

## 2015-01-06 NOTE — Telephone Encounter (Signed)
Patient requesting a refill on her Zyrtec.  Patient states she called yesterday but no one has called her back.

## 2015-01-07 NOTE — Telephone Encounter (Signed)
I sent this in yesterday, and it is claritin not zyrtec.  Is she actually taking zyrtec?

## 2015-01-07 NOTE — Telephone Encounter (Signed)
Spoke with patient, she was confusing the names.  She is only taking the Claritin.  Sorry for the duplication.

## 2015-02-10 ENCOUNTER — Ambulatory Visit (INDEPENDENT_AMBULATORY_CARE_PROVIDER_SITE_OTHER): Payer: PPO | Admitting: Internal Medicine

## 2015-02-10 ENCOUNTER — Encounter: Payer: Self-pay | Admitting: Internal Medicine

## 2015-02-10 VITALS — BP 122/76 | HR 75 | Temp 98.0°F | Wt 184.9 lb

## 2015-02-10 DIAGNOSIS — E119 Type 2 diabetes mellitus without complications: Secondary | ICD-10-CM | POA: Diagnosis not present

## 2015-02-10 DIAGNOSIS — H6041 Cholesteatoma of right external ear: Secondary | ICD-10-CM

## 2015-02-10 DIAGNOSIS — H7191 Unspecified cholesteatoma, right ear: Secondary | ICD-10-CM

## 2015-02-10 DIAGNOSIS — M4722 Other spondylosis with radiculopathy, cervical region: Secondary | ICD-10-CM | POA: Diagnosis not present

## 2015-02-10 DIAGNOSIS — H6040 Cholesteatoma of external ear, unspecified ear: Secondary | ICD-10-CM | POA: Diagnosis not present

## 2015-02-10 DIAGNOSIS — I10 Essential (primary) hypertension: Secondary | ICD-10-CM | POA: Diagnosis not present

## 2015-02-10 DIAGNOSIS — E785 Hyperlipidemia, unspecified: Secondary | ICD-10-CM

## 2015-02-10 DIAGNOSIS — H6090 Unspecified otitis externa, unspecified ear: Secondary | ICD-10-CM | POA: Insufficient documentation

## 2015-02-10 DIAGNOSIS — M8580 Other specified disorders of bone density and structure, unspecified site: Secondary | ICD-10-CM

## 2015-02-10 LAB — POCT GLYCOSYLATED HEMOGLOBIN (HGB A1C): Hemoglobin A1C: 6.5

## 2015-02-10 LAB — GLUCOSE, CAPILLARY: Glucose-Capillary: 110 mg/dL — ABNORMAL HIGH (ref 65–99)

## 2015-02-10 NOTE — Progress Notes (Signed)
   Subjective:    [name], female    DOB: 03-09-1950, 65 y.o.   MRN: [id number]  CC: Ear pain X 1 week  HPI  Mariah Henderson is a 65yo woman with PMH od HTN, DM2, osteopenia who presents with 1 week of ear pain.  She states that she started to notice the pain one week ago.  She had a fall off her bed a few days ago where she hit her head and the pain got worse after that.  She has had hearing loss, some dizziness (history of BPPV).  She reports that she was standing on her bed to hand a picture and when she stepped down, she stumbled and fell.  She did not lose consciousness.  She did not seek medical care.  She has been using a Qtip and feels that she may have gotten something "stuck" in thre.  The canal hurts, the tragus hurts, but the ear does not hurt with manipulation.  She has some tenderness on the posterior skin behind the ear as well.  She has had no drainage from the ear.  No tinnitus.  She denies any fever, chills, but she has felt upset on her stomach.    She further notes that she feels her DM is getting worse.  We checked an A1C today and it is mildly worse which we discussed, but still below 7, so I do not think she needs any med changes today.    She continues to have numbness in her left hand, she is due to see her spinal surgeon today at 2pm.    She is not smoking.   Review of Systems  Constitutional: Negative for activity change and appetite change.  HENT: Positive for ear pain and hearing loss. Negative for ear discharge, sore throat and tinnitus.   Eyes: Negative for photophobia and visual disturbance.  Respiratory: Negative for cough and shortness of breath.   Neurological: Positive for light-headedness (chronic).  Hematological: Does not bruise/bleed easily.       Objective:   Physical Exam  Constitutional: She is oriented to person, place, and time. She appears well-developed and well-nourished. No distress.  HENT:  Head: Normocephalic and  atraumatic.  Right Ear: There is tenderness (to tragus). No drainage. No mastoid tenderness. Tympanic membrane is injected (opaque TM) and perforated (possible). No hemotympanum. Decreased hearing is noted.  Ears:  Eyes: Conjunctivae are normal. No scleral icterus.  Cardiovascular: Normal rate and regular rhythm.   Pulmonary/Chest: Effort normal and breath sounds normal.  Neurological: She is alert and oriented to person, place, and time.  Psychiatric: She has a normal mood and affect. Her behavior is normal.   Lipid profile today.        Assessment & Plan:  RTC in 3 months for review of DM.

## 2015-02-10 NOTE — Assessment & Plan Note (Signed)
A1C 6.5 today, which is within goal for this patient. She is currently not on any hypoglycemic medications.  She is doing very well.  BP is controlled, LDL well controlled.

## 2015-02-10 NOTE — Assessment & Plan Note (Signed)
Advised her to start taking calcium and vitamin D daily.

## 2015-02-10 NOTE — Assessment & Plan Note (Signed)
BP is at goal today.   Plan Continue benazepirl, lasix,

## 2015-02-10 NOTE — Patient Instructions (Signed)
Mariah Henderson - -  It was very good to see you today!  For your ear, we have you an appointment to see [facility] on Friday 12/13/15 at 10:40am.   For your diabetes, you A1C is 6.9.  Keep taking all of your medications as you have been.   For your low bone mass (osteopenia), please start taking calcium and vitamin D daily.  You can get these vitamins at any pharmacy.  The least expensive option is at [place].    Please call me with any questions!  Thanks!

## 2015-02-10 NOTE — Assessment & Plan Note (Signed)
Continues to have left hand symptoms and difficulty with movement.   She is due to see her neck surgeon today to discuss further.

## 2015-02-10 NOTE — Assessment & Plan Note (Signed)
Given pain, possible perforated TM and hearing loss, will get early ENT referral.  External Abx could be started, but would prefer to get culture data if needed, and these would also be contraindicated in a TM tear.  Could also do oral Abx.  I advised her to call if any elevated temperature, worsening pain or foul discharge were to occur.    Appt with ENT on Friday at 10:40am arranged.

## 2015-02-11 LAB — LIPID PANEL
Chol/HDL Ratio: 2.7 ratio units (ref 0.0–4.4)
Cholesterol, Total: 150 mg/dL (ref 100–199)
HDL: 56 mg/dL (ref >39)
LDL Calculated: 82 mg/dL (ref 0–99)
Triglycerides: 62 mg/dL (ref 0–149)
VLDL Cholesterol Cal: 12 mg/dL (ref 5–40)

## 2015-02-18 ENCOUNTER — Other Ambulatory Visit: Payer: Self-pay | Admitting: Otolaryngology

## 2015-02-18 DIAGNOSIS — H9201 Otalgia, right ear: Secondary | ICD-10-CM

## 2015-02-18 DIAGNOSIS — H60391 Other infective otitis externa, right ear: Secondary | ICD-10-CM

## 2015-02-23 ENCOUNTER — Other Ambulatory Visit: Payer: PPO

## 2015-02-23 ENCOUNTER — Other Ambulatory Visit: Payer: Self-pay | Admitting: Otolaryngology

## 2015-02-23 DIAGNOSIS — H9201 Otalgia, right ear: Secondary | ICD-10-CM

## 2015-02-23 DIAGNOSIS — H938X1 Other specified disorders of right ear: Secondary | ICD-10-CM

## 2015-03-16 ENCOUNTER — Ambulatory Visit (HOSPITAL_COMMUNITY)
Admission: RE | Admit: 2015-03-16 | Discharge: 2015-03-16 | Disposition: A | Discharge status: Home / Self Care | Payer: PPO | Source: Ambulatory Visit | Attending: Otolaryngology | Admitting: Otolaryngology

## 2015-03-16 ENCOUNTER — Encounter (HOSPITAL_COMMUNITY): Payer: Self-pay

## 2015-03-16 DIAGNOSIS — R937 Abnormal findings on diagnostic imaging of other parts of musculoskeletal system: Secondary | ICD-10-CM | POA: Insufficient documentation

## 2015-03-16 DIAGNOSIS — H938X1 Other specified disorders of right ear: Secondary | ICD-10-CM

## 2015-03-16 DIAGNOSIS — H9201 Otalgia, right ear: Secondary | ICD-10-CM | POA: Diagnosis not present

## 2015-03-16 LAB — POCT I-STAT CREATININE: Creatinine, Ser: 0.8 mg/dL (ref 0.44–1.00)

## 2015-03-16 MED ORDER — IOHEXOL 300 MG/ML  SOLN
75.0000 mL | Freq: Once | INTRAMUSCULAR | Status: AC | PRN
  Administered 2015-03-16: 75 mL via INTRAVENOUS

## 2015-03-23 ENCOUNTER — Telehealth: Payer: Self-pay | Admitting: Internal Medicine

## 2015-03-23 NOTE — Telephone Encounter (Signed)
APPT. REMINDER CALL, LMTCB °

## 2015-03-24 ENCOUNTER — Encounter: Payer: Self-pay | Admitting: Internal Medicine

## 2015-03-24 ENCOUNTER — Ambulatory Visit (INDEPENDENT_AMBULATORY_CARE_PROVIDER_SITE_OTHER): Payer: PPO | Admitting: Internal Medicine

## 2015-03-24 VITALS — BP 107/57 | HR 74 | Temp 98.1°F | Ht 60.0 in | Wt 188.3 lb

## 2015-03-24 DIAGNOSIS — Z79899 Other long term (current) drug therapy: Secondary | ICD-10-CM

## 2015-03-24 DIAGNOSIS — I10 Essential (primary) hypertension: Secondary | ICD-10-CM | POA: Diagnosis not present

## 2015-03-24 DIAGNOSIS — R5383 Other fatigue: Secondary | ICD-10-CM | POA: Diagnosis not present

## 2015-03-24 DIAGNOSIS — J309 Allergic rhinitis, unspecified: Secondary | ICD-10-CM

## 2015-03-24 DIAGNOSIS — E119 Type 2 diabetes mellitus without complications: Secondary | ICD-10-CM | POA: Diagnosis not present

## 2015-03-24 DIAGNOSIS — E785 Hyperlipidemia, unspecified: Secondary | ICD-10-CM | POA: Diagnosis not present

## 2015-03-24 DIAGNOSIS — R29898 Other symptoms and signs involving the musculoskeletal system: Secondary | ICD-10-CM

## 2015-03-24 DIAGNOSIS — J302 Other seasonal allergic rhinitis: Secondary | ICD-10-CM

## 2015-03-24 DIAGNOSIS — T733XXA Exhaustion due to excessive exertion, initial encounter: Secondary | ICD-10-CM

## 2015-03-24 DIAGNOSIS — H6091 Unspecified otitis externa, right ear: Secondary | ICD-10-CM

## 2015-03-24 LAB — GLUCOSE, CAPILLARY: Glucose-Capillary: 135 mg/dL — ABNORMAL HIGH (ref 65–99)

## 2015-03-24 NOTE — Progress Notes (Signed)
   Subjective:    [name], female    DOB: Jan 25, 1950, 65 y.o.   MRN: [id number]  CC: Routine follow up for HTN, 4 month follow up.   HPI   Mariah Henderson is a 65yo woman with PMH of Ear infection (seen last time for this), DM2, HTN, allergies who presents for follow up and complaints of weakness.    1 month, weak in the legs, only when walking, not able to exercise as much.  Feel tired and weak even at the store, no strength.  Sleeping well, same amount.  On average she gets 4-5 hours of sleep a night.  That has been going on for a while though.  Started when she started her exercise program back and was walking.  No blood loss, no blood in stool.  Burning and stinging in the skin due to eczema.  She has itching from hemorrhoids.  She had a hysterectomy.  Appetite low, 1 month.  No weight loss.  Cold at night.  Sometimes wakes with sweating, but not often.  No N/V/D.  1-2 nights of chest pain which has resolved.    Supposed to be taking therapy for her cervical spondylosis, but feels too tired to do so.   Reviewed notes from ENT and she did indeed have a Q tip stuck in her ear which had necrosed and she has an external ear infection.  She is still taking drops for this.      Quit smoking years ago.    Review of Systems  Constitutional: Positive for activity change and fatigue. Negative for appetite change and unexpected weight change.  HENT: Positive for ear pain (mild, persistent since last visit). Negative for ear discharge and hearing loss.   Respiratory: Negative for cough, shortness of breath and stridor.   Cardiovascular: Negative for chest pain and leg swelling.  Musculoskeletal: Positive for back pain and arthralgias.  Neurological: Positive for weakness. Negative for light-headedness.       Objective:   Physical Exam  Constitutional: She is oriented to person, place, and time. She appears well-developed and well-nourished. No distress.  HENT:  Head:  Normocephalic and atraumatic.  Right external ear drum with debris noted around, appeared to be wax.   Eyes: Conjunctivae are normal. No scleral icterus.  Cardiovascular: Normal rate, regular rhythm and normal heart sounds.   No murmur heard. Pulmonary/Chest: Effort normal and breath sounds normal. No respiratory distress. She has no wheezes.  Abdominal: Soft. Bowel sounds are normal.  Neurological: She is alert and oriented to person, place, and time.  Psychiatric: She has a normal mood and affect. Her behavior is normal.   CBC, CMP, TSH today      Assessment & Plan:  RTC in 2-4 weeks.

## 2015-03-24 NOTE — Patient Instructions (Signed)
Ms. Troxell - -   I am sorry you have not been feeling well.  For your fatigue, we will do some bloodwork to day to see if we can find the cause.   For your ear pain, continue your drops and call Dr. Theressa Millard office in next day or two for results of your CT scan.   Please come back to see me in 2-4 weeks.   Thank you!

## 2015-03-25 LAB — CBC WITH DIFFERENTIAL/PLATELET
Basophils Absolute: 0 10*3/uL (ref 0.0–0.2)
Basos: 0 %
EOS (ABSOLUTE): 0.2 10*3/uL (ref 0.0–0.4)
Eos: 4 %
Hematocrit: 41.6 % (ref 34.0–46.6)
Hemoglobin: 13.4 g/dL (ref 11.1–15.9)
Immature Grans (Abs): 0 10*3/uL (ref 0.0–0.1)
Immature Granulocytes: 0 %
Lymphocytes Absolute: 0.6 10*3/uL — ABNORMAL LOW (ref 0.7–3.1)
Lymphs: 9 %
MCH: 28.4 pg (ref 26.6–33.0)
MCHC: 32.2 g/dL (ref 31.5–35.7)
MCV: 88 fL (ref 79–97)
Monocytes Absolute: 0.6 10*3/uL (ref 0.1–0.9)
Monocytes: 9 %
Neutrophils Absolute: 4.9 10*3/uL (ref 1.4–7.0)
Neutrophils: 78 %
Platelets: 285 10*3/uL (ref 150–379)
RBC: 4.72 x10E6/uL (ref 3.77–5.28)
RDW: 13.4 % (ref 12.3–15.4)
WBC: 6.3 10*3/uL (ref 3.4–10.8)

## 2015-03-25 LAB — CMP14 + ANION GAP
ALT: 18 IU/L (ref 0–32)
AST: 22 IU/L (ref 0–40)
Albumin/Globulin Ratio: 1.7 (ref 1.1–2.5)
Albumin: 4.3 g/dL (ref 3.6–4.8)
Alkaline Phosphatase: 118 IU/L — ABNORMAL HIGH (ref 39–117)
Anion Gap: 14 mmol/L (ref 10.0–18.0)
BUN/Creatinine Ratio: 20 (ref 11–26)
BUN: 17 mg/dL (ref 8–27)
Bilirubin Total: 0.3 mg/dL (ref 0.0–1.2)
CO2: 27 mmol/L (ref 18–29)
Calcium: 9.9 mg/dL (ref 8.7–10.3)
Chloride: 99 mmol/L (ref 96–106)
Creatinine, Ser: 0.86 mg/dL (ref 0.57–1.00)
GFR calc Af Amer: 81 mL/min/{1.73_m2} (ref >59)
GFR calc non Af Amer: 70 mL/min/{1.73_m2} (ref >59)
Globulin, Total: 2.6 g/dL (ref 1.5–4.5)
Glucose: 114 mg/dL — ABNORMAL HIGH (ref 65–99)
Potassium: 4.7 mmol/L (ref 3.5–5.2)
Sodium: 140 mmol/L (ref 134–144)
Total Protein: 6.9 g/dL (ref 6.0–8.5)

## 2015-03-25 LAB — TSH: TSH: 0.843 u[IU]/mL (ref 0.450–4.500)

## 2015-03-26 DIAGNOSIS — R5383 Other fatigue: Secondary | ICD-10-CM | POA: Insufficient documentation

## 2015-03-26 NOTE — Assessment & Plan Note (Signed)
Doing well today.  No acute issues.  Continue loratadine

## 2015-03-26 NOTE — Assessment & Plan Note (Signed)
Doing well, last A1C was 6.5 which she was glad to hear.  Advised her to get an eye exam.

## 2015-03-26 NOTE — Assessment & Plan Note (Signed)
BP well controlled today.  Kidney function normal.   Continue benazepril, furosemide.

## 2015-03-26 NOTE — Assessment & Plan Note (Signed)
Doing well, no issues taking her pills.   Continue atorvastatin.

## 2015-03-26 NOTE — Assessment & Plan Note (Signed)
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  Her ear canal today is clear.  We reviewed her CT scan, however, I asked her to call her ENT physician to confirm ongoing plan.   Continue ear drops.  Follow up with ENT.

## 2015-03-26 NOTE — Assessment & Plan Note (Signed)
This is the main thing we discussed today.  Differential includes anemia, thyroid disease, chronic infection (ear), kidney disease and others.    Labwork done today did not show any acute issues, except for a mild elevation in the ALP to 118 which was new for her.  She also notes that she recently started back a rather intense work out routine and this could be leading to some fatigue.   For an elevated ALP, will need to repeat a fasting level, check GGT to start.  This will help differentiate if it is related to eating and/or confirm hepatic source.  At next visit, will have these done.

## 2015-04-26 ENCOUNTER — Telehealth: Payer: Self-pay | Admitting: Internal Medicine

## 2015-04-26 NOTE — Telephone Encounter (Signed)
APPT. REMINDER CALL, LMTCB °

## 2015-04-28 ENCOUNTER — Ambulatory Visit (INDEPENDENT_AMBULATORY_CARE_PROVIDER_SITE_OTHER): Payer: PPO | Admitting: Internal Medicine

## 2015-04-28 ENCOUNTER — Encounter: Payer: Self-pay | Admitting: Internal Medicine

## 2015-04-28 VITALS — BP 117/66 | HR 74 | Temp 98.2°F | Ht 60.0 in | Wt 187.1 lb

## 2015-04-28 DIAGNOSIS — E1139 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: Secondary | ICD-10-CM | POA: Diagnosis not present

## 2015-04-28 DIAGNOSIS — E119 Type 2 diabetes mellitus without complications: Secondary | ICD-10-CM

## 2015-04-28 DIAGNOSIS — I10 Essential (primary) hypertension: Secondary | ICD-10-CM

## 2015-04-28 DIAGNOSIS — R748 Abnormal levels of other serum enzymes: Secondary | ICD-10-CM

## 2015-04-28 DIAGNOSIS — Z79899 Other long term (current) drug therapy: Secondary | ICD-10-CM

## 2015-04-28 DIAGNOSIS — Z0000 Encounter for general adult medical examination without abnormal findings: Secondary | ICD-10-CM

## 2015-04-28 DIAGNOSIS — Z6836 Body mass index (BMI) 36.0-36.9, adult: Secondary | ICD-10-CM

## 2015-04-28 DIAGNOSIS — R5383 Other fatigue: Secondary | ICD-10-CM | POA: Diagnosis not present

## 2015-04-28 DIAGNOSIS — H409 Unspecified glaucoma: Secondary | ICD-10-CM | POA: Diagnosis not present

## 2015-04-28 DIAGNOSIS — H60399 Other infective otitis externa, unspecified ear: Secondary | ICD-10-CM

## 2015-04-28 DIAGNOSIS — L988 Other specified disorders of the skin and subcutaneous tissue: Secondary | ICD-10-CM

## 2015-04-28 DIAGNOSIS — E669 Obesity, unspecified: Secondary | ICD-10-CM

## 2015-04-28 LAB — GLUCOSE, CAPILLARY: Glucose-Capillary: 100 mg/dL — ABNORMAL HIGH (ref 65–99)

## 2015-04-28 LAB — POCT GLYCOSYLATED HEMOGLOBIN (HGB A1C): Hemoglobin A1C: 6.5

## 2015-04-28 MED ORDER — TRIAMCINOLONE ACETONIDE 0.1 % EX CREA: TOPICAL_CREAM | Freq: Two times a day (BID) | CUTANEOUS | Status: DC

## 2015-04-28 NOTE — Assessment & Plan Note (Signed)
She is following with Dr. Katy Fitch.  Will get records from that visit.  She is on eye drops.

## 2015-04-28 NOTE — Assessment & Plan Note (Signed)
This continues to be a problem.  She has recently changed to a new sleep aide, trazodone.  She has also recently had an acute infection in otitis externa.  Checked some basic lab work at last visit and showed very mild elevation to ALP.    Plan Decrease trazodone to 25mg  at night PT follow up (she was supposed to do this after her neck surgery) Recheck CMET and GGT.

## 2015-04-28 NOTE — Progress Notes (Signed)
   Subjective:    [name], female    DOB: 06/30/1950, 65 y.o.   MRN: [id number]  CC: 1 month follow up for fatigue  HPI   [name] is a 65yo woman with PMH of DM2, HTN, glaucoma, HLD, OA and recent otitis externa who presents for 1 month follow up for fatigue.  She had lab work done at that time and was found to only have an elevated ALP on CMET (very mild).  [name] reports that she is still feeling fatigued.  This seems to have been going on for a few months, possibly since her cervical surgery.  She feels weakness and fatigue.  She has no issues with sleeping, but she is on a new sleeping medications, Trazodone, which was filled by her mental health provider at the [facility]  She says she has been taking 1 tablet, 50mg .  She also notes that she was supposed to go to PT after her surgery but did not.  She is not falling or passing out.  Will recheck her CMET and a GGT today.   She did go to see Dr. Katy Fitch for her eye exam and is on drops for her glaucoma now.    Her ear is improving.  She had otitis externa and is still on drops.   She notes no urinary symptoms, no GI symptoms.  We discussed screening for HCV and will do that today.    She did not bring in her medications.  She is not smoking.   Review of Systems  Constitutional: Positive for activity change and fatigue. Negative for fever and chills.  HENT: Negative for ear discharge and ear pain.   Eyes: Negative for photophobia and visual disturbance.  Gastrointestinal: Negative for abdominal pain, diarrhea, constipation and abdominal distention.  Genitourinary: Negative for dysuria and difficulty urinating.  Skin: Positive for rash (h/o eczema).  Neurological: Negative for dizziness and light-headedness.       Objective:   Physical Exam  Constitutional: She is oriented to person, place, and time. She appears well-developed and well-nourished. No distress.  HENT:  Head: Normocephalic and  atraumatic.  Eyes:  Wearing eyeglasses  Cardiovascular: Normal rate, regular rhythm and normal heart sounds.   No murmur heard. Pulmonary/Chest: Effort normal and breath sounds normal. No respiratory distress. She has no wheezes.  Abdominal: Soft. Bowel sounds are normal. She exhibits no distension and no mass. There is no tenderness. There is no rebound.  Musculoskeletal: She exhibits no tenderness. Edema: non pitting to shins.  Neurological: She is alert and oriented to person, place, and time.  Psychiatric: She has a normal mood and affect. Her behavior is normal.    CMET, GGT, HCV today      Assessment & Plan:  RTC in 4 months

## 2015-04-28 NOTE — Assessment & Plan Note (Addendum)
Her A1C today was 6.5, which continues to be at goal.  She has an LDL of 82 when last checked.  She had her eye exam with Dr. Katy Fitch since I last saw her and we will attempt to get records.  She is on benazapril for blood pressure and her BP today was 117/66.   Reviewed her BS log and she is averaging 127.  She is doing very well.  Will need to work on weight loss, see above problem.

## 2015-04-28 NOTE — Patient Instructions (Signed)
Ms. Blackham,   Please come back to see me in 4 months, sooner if needed.   For your eczema, I have reordered Kenalog for you.  More information below.   For your fatigue, would recommend decreasing your trazodone to 1/2 tablet per night and trying physical therapy.   Thank you!  Call if you have any questions  Triamcinolone skin cream, ointment, lotion, or aerosol What is this medicine? TRIAMCINOLONE (trye am SIN oh lone) is a corticosteroid. It is used on the skin to reduce swelling, redness, itching, and allergic reactions. This medicine may be used for other purposes; ask your health care provider or pharmacist if you have questions. What should I tell my health care provider before I take this medicine? They need to know if you have any of these conditions: -diabetes -infection, like tuberculosis, herpes, or fungal infection -large areas of burned or damaged skin -skin wasting or thinning -an unusual or allergic reaction to triamcinolone, corticosteroids, other medicines, foods, dyes, or preservatives -pregnant or trying to get pregnant -breast-feeding How should I use this medicine? This medicine is for external use only. Do not take by mouth. Follow the directions on the prescription label. Wash your hands before and after use. Apply a thin film of medicine to the affected area. Do not cover with a bandage or dressing unless your doctor or health care professional tells you to. Do not use on healthy skin or over large areas of skin. Do not get this medicine in your eyes. If you do, rinse out with plenty of cool tap water. It is important not to use more medicine than prescribed. Do not use your medicine more often than directed. Talk to your pediatrician regarding the use of this medicine in children. Special care may be needed. Elderly patients are more likely to have damaged skin through aging, and this may increase side effects. This medicine should only be used for brief periods  and infrequently in older patients. Overdosage: If you think you have taken too much of this medicine contact a poison control center or emergency room at once. NOTE: This medicine is only for you. Do not share this medicine with others. What if I miss a dose? If you miss a dose, use it as soon as you can. If it is almost time for your next dose, use only that dose. Do not use double or extra doses. What may interact with this medicine? Interactions are not expected. This list may not describe all possible interactions. Give your health care provider a list of all the medicines, herbs, non-prescription drugs, or dietary supplements you use. Also tell them if you smoke, drink alcohol, or use illegal drugs. Some items may interact with your medicine. What should I watch for while using this medicine? Tell your doctor or health care professional if your symptoms do not start to get better within one week. Do not use for more than 14 days. Do not use on healthy skin or over large areas of skin. Tell your doctor or health care professional if you are exposed to anyone with measles or chickenpox, or if you develop sores or blisters that do not heal properly. Do not use an airtight bandage to cover the affected area unless your doctor or health care professional tells you to. If you are to cover the area, follow the instructions carefully. Covering the area where the medicine is applied can increase the amount that passes through the skin and increases the risk of side  effects. If treating the diaper area of a child, avoid covering the treated area with tight-fitting diapers or plastic pants. This may increase the amount of medicine that passes through the skin and increase the risk of serious side effects. What side effects may I notice from receiving this medicine? Side effects that you should report to your doctor or health care professional as soon as possible: -burning or itching of the skin -dark red  spots on the skin -infection -painful, red, pus filled blisters in hair follicles -thinning of the skin, sunburn more likely especially on the face Side effects that usually do not require medical attention (report to your doctor or health care professional if they continue or are bothersome): -dry skin, irritation -unusual increased growth of hair on the face or body This list may not describe all possible side effects. Call your doctor for medical advice about side effects. You may report side effects to FDA at 1-800-FDA-1088. Where should I keep my medicine? Keep out of the reach of children. Store at room temperature between 15 and 30 degrees C (59 and 86 degrees F). Do not freeze. Throw away any unused medicine after the expiration date. NOTE: This sheet is a summary. It may not cover all possible information. If you have questions about this medicine, talk to your doctor, pharmacist, or health care provider.    2016, Elsevier/Gold Standard. (2013-04-24 15:59:51)

## 2015-04-28 NOTE — Assessment & Plan Note (Signed)
HCV check today

## 2015-04-28 NOTE — Assessment & Plan Note (Addendum)
She is actively working on weight loss, but is recently feeling more fatigued.  She has maintained the same weight since I last saw her.  She has diet controlled DM.  Would suggest her seeing nutritionist at next visit.

## 2015-04-28 NOTE — Assessment & Plan Note (Signed)
BP today was 117/66, which is well controlled.  She is taking benazapril and lasix without issue.  Last BMET showed stable renal function, normal K and Na.  She is tolerating without issue.   Plan:  Continue benazapril and lasix.

## 2015-04-29 LAB — CMP14 + ANION GAP
ALT: 10 IU/L (ref 0–32)
AST: 15 IU/L (ref 0–40)
Albumin/Globulin Ratio: 1.5 (ref 1.2–2.2)
Albumin: 4.1 g/dL (ref 3.6–4.8)
Alkaline Phosphatase: 109 IU/L (ref 39–117)
Anion Gap: 15 mmol/L (ref 10.0–18.0)
BUN/Creatinine Ratio: 19 (ref 12–28)
BUN: 16 mg/dL (ref 8–27)
Bilirubin Total: 0.3 mg/dL (ref 0.0–1.2)
CO2: 28 mmol/L (ref 18–29)
Calcium: 9.9 mg/dL (ref 8.7–10.3)
Chloride: 97 mmol/L (ref 96–106)
Creatinine, Ser: 0.86 mg/dL (ref 0.57–1.00)
GFR calc Af Amer: 81 mL/min/{1.73_m2} (ref >59)
GFR calc non Af Amer: 70 mL/min/{1.73_m2} (ref >59)
Globulin, Total: 2.7 g/dL (ref 1.5–4.5)
Glucose: 152 mg/dL — ABNORMAL HIGH (ref 65–99)
Potassium: 4.2 mmol/L (ref 3.5–5.2)
Sodium: 140 mmol/L (ref 134–144)
Total Protein: 6.8 g/dL (ref 6.0–8.5)

## 2015-04-29 LAB — GAMMA GT: GGT: 19 IU/L (ref 0–60)

## 2015-04-29 LAB — HEPATITIS C ANTIBODY: Hep C Virus Ab: <0.1 s/co ratio (ref 0.0–0.9)

## 2015-05-03 ENCOUNTER — Encounter: Payer: Self-pay | Admitting: *Deleted

## 2015-05-12 ENCOUNTER — Other Ambulatory Visit: Payer: Self-pay | Admitting: *Deleted

## 2015-05-12 MED ORDER — ATORVASTATIN CALCIUM 40 MG PO TABS: 40.0000 mg | ORAL_TABLET | Freq: Every day | ORAL | Status: DC

## 2015-05-12 NOTE — Telephone Encounter (Signed)
Call from pt-out of cholesterol medication-refill request sent to pcp.Mariah Henderson, Mariah Vanderhoef Cassady4/26/201711:40 AM

## 2015-05-26 ENCOUNTER — Telehealth: Payer: Self-pay

## 2015-05-26 NOTE — Telephone Encounter (Signed)
Pt called for refill on HCTZ- advised per last OV she is no longer on this med.  She now understands.  No additional needs.

## 2015-06-28 ENCOUNTER — Encounter: Payer: Self-pay | Admitting: Pulmonary Disease

## 2015-06-28 ENCOUNTER — Ambulatory Visit (INDEPENDENT_AMBULATORY_CARE_PROVIDER_SITE_OTHER): Payer: PPO | Admitting: Pulmonary Disease

## 2015-06-28 VITALS — BP 144/80 | HR 70 | Temp 98.2°F | Ht 60.0 in | Wt 187.5 lb

## 2015-06-28 DIAGNOSIS — J45909 Unspecified asthma, uncomplicated: Secondary | ICD-10-CM

## 2015-06-28 DIAGNOSIS — E119 Type 2 diabetes mellitus without complications: Secondary | ICD-10-CM

## 2015-06-28 DIAGNOSIS — J4540 Moderate persistent asthma, uncomplicated: Secondary | ICD-10-CM

## 2015-06-28 DIAGNOSIS — R600 Localized edema: Secondary | ICD-10-CM | POA: Diagnosis not present

## 2015-06-28 DIAGNOSIS — I10 Essential (primary) hypertension: Secondary | ICD-10-CM

## 2015-06-28 LAB — GLUCOSE, CAPILLARY: Glucose-Capillary: 86 mg/dL (ref 65–99)

## 2015-06-28 MED ORDER — FLUTICASONE-SALMETEROL 100-50 MCG/DOSE IN AEPB: 1.0000 | INHALATION_SPRAY | Freq: Two times a day (BID) | RESPIRATORY_TRACT | Status: DC

## 2015-06-28 NOTE — Patient Instructions (Signed)
Things you can do to help reduce your leg swelling (edema):  Increase your exercise.  Reduce salt (sodium) in your diet - Sodium, which is found in table salt and processed foods, can worsen edema. Reducing the amount of salt you consume can help to reduce edema, especially if you also take a diuretic.  Compression stockings - Leg edema can be prevented and treated with the use of compression stockings. Stockings are available in several heights, including knee-high, thigh-high, and pantyhose.  Body positioning - Leg, ankle, and foot edema can be improved by elevating the legs above heart level for 30 minutes three or four times per day.   Low Sodium Diet  HOW DO I CUT DOWN ON SODIUM? - Although it is difficult to cut back on the amount of sodium in the diet, most people find that their taste adjusts quickly to reduced sodium. Salt is an acquired taste, and taste buds can be retrained in less than one to two weeks if people stick with the lower-sodium diet. Fresh herbs, spice blends without sodium, citrus, and flavored vinegar make tasty alternatives to the salt shaker.  It may be helpful to keep a detailed food record and add up sodium intake. Within a short period of time (less than a week), the main sources of sodium can be identified, and daily intake can be calculated.  Suggestions to decrease sodium include the following:  ?Put away the salt shaker and reduce or eliminate salt in cooking. Experiment with herbs, spices, garlic, onions, or lemon instead. ?Look for low-sodium products such as spice blends, and read labels for serving size and sodium content on canned, bottled, and frozen foods. ?Make a list of healthy low-sodium foods to substitute. Many grocery stores now supply this information. ?When dining out, request the food be prepared without salt, have dressings or sauces on the side, and avoid bacon bits, cheese, and croutons at the salad bar. ?Do not add salt to food while  cooking or before eating. Teach family members to taste food before adding salt. ?Avoid eating at fast food restaurants. If this is not possible, choose restaurants that offer fruits or vegetables without sauces or dressings. Ask that no salt be used to prepare food, when possible. ?Do not use salt substitutes that are high in potassium unless a healthcare provider approves. Herb and spice combinations that are salt free are widely available and can be used to flavor foods. ?Water softeners remove calcium and add sodium to drinking water. Do not drink softened water. When purchasing bottled water, check the label to ensure that it does not contain sodium. ?Look at labels for over-the-counter medications. Avoid products that contain sodium carbonate or sodium bicarbonate. Sodium bicarbonate is baking soda. ?Fresh fruits and vegetables are naturally low in sodium. In addition, a diet rich in fruits and vegetables provides additional benefits in lowering blood pressure. The DASH diet (Dietary Approaches to Stop Hypertension) is a well-known intervention to treat high blood pressure. The DASH diet requires the person to eat four to five servings of fruit, four to five servings of vegetables, and two to three servings of low-fat dairy, and all foods must contain less than 25 percent total fat per serving.  Foods to choose - The following are examples of foods that are generally low in sodium. Check the label to determine the amount of sodium, as amounts can vary widely from one brand to another.  ?Breads - Whole grain breads, English muffins, bagels, corn and flour tortillas, most  muffins ?Cereals - Many cooked low-salt (read the label to determine sodium content) hot cereals (not instant) such as oatmeal, cream of wheat, rice, or farina, puffed wheat, puffed rice, shredded wheat ?Crackers and snack foods - All unsalted crackers and snack foods, unsalted peanut butter, unsalted nuts or seeds, unsalted  popcorn ?Pasta, rice, and potatoes - Any type of pasta (cooked in unsalted water), potatoes, white or brown rice ?Dried peas and beans - Any cooked dried beans or peas (without seasoning packet), or low-salt canned beans and peas ?Meats and protein - Fresh or frozen beef, poultry, and fish; low-sodium canned tuna and salmon; eggs or egg substitutes ?Fruits and vegetables - Any fresh, frozen, or canned fruit, any fresh or frozen vegetables without sauce, canned vegetables without salt, low-salt tomato sauce/paste ?Dairy products - Milk, cream, sour cream, non-dairy creamer, yogurt, lower-sodium cottage and other cheeses (be sure to read labels for serving size) ?Fats and oils - Plant oils (olive, canola, corn, peanut), unsalted butter or margarine ?Soups - Salt-free soups and low-sodium bouillon cubes, unsalted broth, homemade soup without added salt ?Sweets - Gelatin, sherbet, pudding, ice cream (brands vary widely), salt-free baked goods, sugar, honey, jam, jelly, marmalade, syrup ?Beverages - Coffee, tea, soft drinks, fruit-flavored drinks, low-salt tomato juice, any fruit juice ?Condiments - Fresh and dried herbs; lemon juice; low-salt mustard (not commercially available but can be made at home), vinegar, and Tabasco sauce; low- or no-salt ketchup; seasoning blends that do not contain salt  Foods to avoid - Many foods, especially those that are processed, have a high sodium content. Items that can be substituted for high-sodium foods are listed in the following table (table 2).  ?Breads - Biscuits, prepared mixes (pancake, muffin, cornbread), instant hot cereals, many boxed cold cereals, self-rising flour ?Crackers and snack foods - Salted crackers and snack items (chips, pretzels, popcorn), regular peanut butter, prepared dips/spreads, salted nuts or seeds ?Pasta, rice, and potatoes - Macaroni and cheese mix; rice, noodle, or spaghetti mixes; canned spaghetti; frozen lasagna; instant potatoes;  seasoned potato mixes ?Beans and peas - Beans or peas prepared with ham, bacon, salt pork, or bacon grease; most canned beans ?Meats and proteins - Salted, smoked, canned, spiced, and cured meat, poultry, or fish; bacon; ham; sausage; lunch meats; hot dogs; breaded frozen meat, fish, or poultry; frozen dinners; pizza ?Fruits and vegetables - Regular canned vegetables and vegetable juices, regular tomato sauce and tomato paste, olives, pickles, relishes, sauerkraut, frozen vegetables in butter or sauces, crystallized and glazed fruit, maraschino cherries, fruit dried with sodium sulfite ?Dairy products - Buttermilk, Dutch-processed chocolate milk, processed cheese slices and spreads, most cottage cheese, aged or natural cheeses ?Fats and oils - Prepared salad dressings, bacon, salt pork, fatback, salted butter or margarine ?Soups - Regular canned or prepared soups, stews, broths, or bouillon; packaged and frozen soups ?Desserts - Packaged baked goods ?Beverages - Softened water; carbonated beverages with sodium or salt added; regular tomato juice (V8); ask about alcoholic beverages ?Condiments - Table salt, lite salt, bouillon cubes, meat extract, taco seasoning, Worcestershire sauce, tartar sauce, ketchup, chili sauce, cooking sherry and wine, onion salt, mustard, garlic salt, soy sauce, tamari, meat flavoring or tenderizer, steak and barbecue sauce, seasoned salt, monosodium glutamate (MSG), Dutch-processed cocoa

## 2015-06-28 NOTE — Assessment & Plan Note (Signed)
Assessment: BP deteriorated today to 144/80. Previously well controlled.  Plan: Benazepril 20mg  daily Work on low salt diet, increase exercise She has follow up in 1 month with her PCP

## 2015-06-28 NOTE — Assessment & Plan Note (Signed)
Doing well on Advair. Refilled Advair.

## 2015-06-28 NOTE — Assessment & Plan Note (Signed)
Random CBG reassuring. Continue diet control.

## 2015-06-28 NOTE — Assessment & Plan Note (Signed)
Assessment: Pitting edema bilaterally to level of below knee. Even bilaterally. No claudication, orthopnea, PND. Intermittent issue dating back to 2014. She is on Lasix 20mg  daily for this. TTE in 2014 with normal LV EF. Previously swelling improves with Lasix and exercise. Recently had decreased exercise due to surgery. Also has some dietary indiscretions.  Plan: Continue Lasix 20mg  daily Increase exercise Low sodium diet Compression stockings Elevate legs for 30 minutes 3-4 times a day Instructed patient to call clinic if sx do not improve or worsen. Consider rechecking BMP, increase Lasix dose.

## 2015-06-28 NOTE — Progress Notes (Signed)
Subjective:    [name], female    DOB: 02/21/1950, 65 y.o.   MRN: [id number]  HPI [name] is a 65 year old woman with history of HTN, HLD, cervical spondylosis, obesity, depression, asthma, COPD, DM2 presenting for evaluation of LE edema.  Started swelling last week. Propping up her feet helps. Noted some swelling in her April visit. It went away at that time spontaneously. This time edema is increased. A little tightness in her hands. Worst at night and improved by morning. Does not put salt in her food. Gets low sodium foods. Drinks diet lemonade - but noted that it does has 30 g of sodium in it. Has been eating at [employer] every Sunday and [employer] about once a month. Does fry foods and eat fried foods. No exertional CP or dyspnea. Can lie flat at night. No PND. No claudication.  Review of Systems Constitutional: no fevers/chills Eyes: no vision changes Respiratory: no shortness of breath Cardiovascular: no chest pain Gastrointestinal: no nausea/vomiting Genitourinary: no dysuria, no hematuria  Past Medical History  Diagnosis Date  . Hypertension   . Hyperlipidemia   . Low back pain   . Cervical spondylosis   . Obesity   . Depression     followed by [name]; no longer of depakote, seroquel  . Chronic serous otitis media   . Foot drop   . Constipation   . Paraumbilical hernia   . Asthma   . COPD (chronic obstructive pulmonary disease) ([place])   . Anxiety   . GERD (gastroesophageal reflux disease)   . Heart murmur   . Diabetes mellitus     Diet controlled    Current Outpatient Prescriptions on File Prior to Visit  Medication Sig Dispense Refill  . acetaminophen (TYLENOL) 500 MG tablet Take 1 tablet (500 mg total) by mouth every 4 (four) hours as needed. Do not take more than 6 times per day (Patient taking differently: Take 500 mg by mouth every 4 (four) hours as needed for mild pain or moderate pain. Do not take more than 6  times per day) 120 tablet 0  . albuterol (PROVENTIL HFA;VENTOLIN HFA) 108 (90 BASE) MCG/ACT inhaler Inhale 2 puffs into the lungs every 6 (six) hours as needed for wheezing. 3 Inhaler 3  . albuterol (PROVENTIL) (2.5 MG/3ML) 0.083% nebulizer solution Take 3 mLs (2.5 mg total) by nebulization every 6 (six) hours as needed for wheezing. 1080 mL 3  . atorvastatin (LIPITOR) 40 MG tablet Take 1 tablet (40 mg total) by mouth daily. 90 tablet 3  . benazepril (LOTENSIN) 20 MG tablet take 1 tablet by mouth once daily 90 tablet 3  . Cholecalciferol (VITAMIN D3) 1000 UNITS CAPS Take 2 tablets by mouth daily.      Marland Kitchen CIPRODEX otic suspension   0  . dexamethasone (DECADRON) 0.1 % ophthalmic solution instill 3 drops into right ear twice a day for 14 days  0  . divalproex (DEPAKOTE ER) 500 MG 24 hr tablet Take 500 mg by mouth as needed (reports that she only takes as needed for depression).     . fluticasone (FLONASE) 50 MCG/ACT nasal spray instill 1 spray into each nostril once daily 16 g 11  . Fluticasone-Salmeterol (ADVAIR DISKUS) 100-50 MCG/DOSE AEPB Inhale 1 puff into the lungs 2 (two) times daily. 60 each 6  . furosemide (LASIX) 20 MG tablet take 1 tablet by mouth once daily 30 tablet 5  . gabapentin (NEURONTIN) 800 MG tablet Take 800  mg by mouth at bedtime.   0  . guaiFENesin-dextromethorphan (ROBITUSSIN DM) 100-10 MG/5ML syrup Take 10 mLs by mouth every 4 (four) hours as needed for cough. 118 mL 0  . HYDROcodone-acetaminophen (NORCO/VICODIN) 5-325 MG per tablet Take 1-2 tablets by mouth every 4 (four) hours as needed (mild pain). 45 tablet 0  . imipramine (TOFRANIL) 50 MG tablet Take 50 mg by mouth at bedtime.     Marland Kitchen ipratropium (ATROVENT) 0.02 % nebulizer solution Take 2.5 mLs (0.5 mg total) by nebulization every 4 (four) hours as needed for wheezing. 75 mL 6  . latanoprost (XALATAN) 0.005 % ophthalmic solution Place 1 drop into both eyes at bedtime.     Marland Kitchen loratadine (CLARITIN) 10 MG tablet Take 1 tablet  (10 mg total) by mouth daily. 30 tablet 11  . ONE TOUCH ULTRA TEST test strip TEST once daily 50 each 6  . ranitidine (ZANTAC) 150 MG tablet   0  . traZODone (DESYREL) 50 MG tablet take 1/2-1 tablet by mouth at bedtime  0  . triamcinolone cream (KENALOG) 0.1 % Apply topically 2 (two) times daily. 30 g 1   No current facility-administered medications on file prior to visit.      Objective:   Physical Exam Blood pressure 144/80, pulse 70, temperature 98.2 F (36.8 C), temperature source Oral, height 5' (1.524 m), weight 187 lb 8 oz (85.049 kg), last menstrual period 04/29/1966, SpO2 99 %. General Apperance: NAD HEENT: Normocephalic, atraumatic, anicteric sclera Neck: Supple, trachea midline Lungs: Clear to auscultation bilaterally. No wheezes, rhonchi or rales. Breathing comfortably Heart: Regular rate and rhythm, no murmur/rub/gallop Abdomen: Soft, nontender, nondistended, no rebound/guarding Extremities: Warm and well perfused, 2+ pitting edema to below knees bilaterally Skin: No rashes or lesions Neurologic: Alert and interactive. No gross deficits.    Assessment & Plan:  Please refer to problem based charting.

## 2015-06-30 NOTE — Progress Notes (Signed)
Internal Medicine Clinic Attending  Case discussed with Dr. Krall at the time of the visit.  We reviewed the resident's history and exam and pertinent patient test results.  I agree with the assessment, diagnosis, and plan of care documented in the resident's note.  

## 2015-08-04 ENCOUNTER — Ambulatory Visit (INDEPENDENT_AMBULATORY_CARE_PROVIDER_SITE_OTHER): Payer: PPO | Admitting: Internal Medicine

## 2015-08-04 ENCOUNTER — Encounter: Payer: Self-pay | Admitting: Internal Medicine

## 2015-08-04 VITALS — BP 147/70 | HR 62 | Temp 97.9°F | Ht 58.5 in | Wt 188.7 lb

## 2015-08-04 DIAGNOSIS — E119 Type 2 diabetes mellitus without complications: Secondary | ICD-10-CM

## 2015-08-04 DIAGNOSIS — R600 Localized edema: Secondary | ICD-10-CM

## 2015-08-04 DIAGNOSIS — K219 Gastro-esophageal reflux disease without esophagitis: Secondary | ICD-10-CM

## 2015-08-04 DIAGNOSIS — H409 Unspecified glaucoma: Secondary | ICD-10-CM

## 2015-08-04 DIAGNOSIS — E1139 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: Secondary | ICD-10-CM | POA: Diagnosis not present

## 2015-08-04 DIAGNOSIS — J45909 Unspecified asthma, uncomplicated: Secondary | ICD-10-CM

## 2015-08-04 DIAGNOSIS — I10 Essential (primary) hypertension: Secondary | ICD-10-CM | POA: Diagnosis not present

## 2015-08-04 DIAGNOSIS — J4540 Moderate persistent asthma, uncomplicated: Secondary | ICD-10-CM

## 2015-08-04 DIAGNOSIS — J302 Other seasonal allergic rhinitis: Secondary | ICD-10-CM

## 2015-08-04 DIAGNOSIS — F325 Major depressive disorder, single episode, in full remission: Secondary | ICD-10-CM

## 2015-08-04 DIAGNOSIS — F3340 Major depressive disorder, recurrent, in remission, unspecified: Secondary | ICD-10-CM

## 2015-08-04 LAB — POCT GLYCOSYLATED HEMOGLOBIN (HGB A1C): Hemoglobin A1C: 6.4

## 2015-08-04 LAB — GLUCOSE, CAPILLARY: Glucose-Capillary: 89 mg/dL (ref 65–99)

## 2015-08-04 NOTE — Patient Instructions (Signed)
Mariah Henderson - -  Thank you for coming in today!  For your swelling, you can increase your lasix to 40mg  (2 tablets) per day on days you notice the swelling is worse.  You should also wear compression stockings in the evening and take them off before bed.    Please continue taking your medications as prescribed.  I would like you come in to the clinic in 1 month and bring all of the medication you are taking so that we can update your chart.    For the burning sensation in your chest, you can try your acid reducer (Zantac) before bed.  You should also try to avoid eating 2-3 hours before bed.    Thank you!  Please call with questions.

## 2015-08-04 NOTE — Progress Notes (Signed)
   Subjective:    [name], female    DOB: 1950-07-05, 65 y.o.   MRN: [id number]  CC: 3 month follow up for HTN, BP  HPI  Ms. Formby is a 65yo woman with PMH of DM, HTN, OA, allergies, RAD and depression who presents for follow up.   Ms. Sax is concerned that her right leg "bows in" at an odd place and wants it checked out.  I did not appreciate a significant difference compared to her right leg.  She also notes that her LE edema has improved and she has been trying to avoid salt. She has not gotten compression stockings yet.    She also notes occasional burning chest pain when she lies down to bed after dinner.  She is taking an acid suppresser, but cannot remember the name.  Ranitidine is listed on the chart.    For her DM she is currently diet controlled.  Review of her BS log shows glucose average of 115.  Her highest recorded are around 140.  She is doing very well.  For her HTN, her BP today was 147/70 and then 139/80 on recheck.  She is taking benazepril without issues.   She did have some confusion about her medications, so I asked her to bring them in next time for a thorough review.   Review of Systems  Constitutional: Negative for activity change and appetite change.  Respiratory: Negative for cough and shortness of breath.   Cardiovascular: Positive for leg swelling. Negative for chest pain.  Gastrointestinal:       Occasional loose stools depending on what she eats  Psychiatric/Behavioral: Negative for dysphoric mood and decreased concentration.       Objective:   Physical Exam  Constitutional: She is oriented to person, place, and time. She appears well-developed and well-nourished. No distress.  HENT:  Head: Normocephalic and atraumatic.  Mouth/Throat: Oropharynx is clear and moist. No oropharyngeal exudate.  Eyes: Conjunctivae are normal. Pupils are equal, round, and reactive to light. No scleral icterus.  Cardiovascular: Normal rate, regular  rhythm and normal heart sounds.   No murmur heard. Pulmonary/Chest: Effort normal and breath sounds normal. No respiratory distress. She has no wheezes.  Abdominal: Soft. Bowel sounds are normal.  Musculoskeletal:  No obvious deformity to right leg.  She has some non pitting edema to the legs to mid calf on the right.  She has 1+ pitting to the calf on the left.  She has no tenderness to palpation or color change.   Neurological: She is alert and oriented to person, place, and time.  Psychiatric: She has a normal mood and affect. Her behavior is normal.   No labs today     Assessment & Plan:  RTC in 1 month with medications for review.

## 2015-08-05 DIAGNOSIS — K219 Gastro-esophageal reflux disease without esophagitis: Secondary | ICD-10-CM | POA: Insufficient documentation

## 2015-08-05 NOTE — Assessment & Plan Note (Signed)
She is having some heartburn when she lies down at night.  I advised her to not eat for 2-3 hours before bedtime and to take acid reducer (reported to be ranitidine).  She is not currently sure if that is the one she has or if she is on a PPI.   Plan RTC in 1 month with all medications for review.

## 2015-08-05 NOTE — Assessment & Plan Note (Signed)
Currently following with an eye doctor and her vision is stable today per report.   Plan Continue ophtho follow up Continue eye drops (dexamethasone and latanoprost)

## 2015-08-05 NOTE — Assessment & Plan Note (Signed)
She is taking flonase and loratadine with good results.   Plan Continue flonase and loratadine.

## 2015-08-05 NOTE — Assessment & Plan Note (Signed)
She is doing well with control with diet only.  BS log reviewed today.  Her average is 115.  She has an A1C of 6.4 today which I congratulated her on.  Her renal function is stable.  She is on an ace-i and statin.  Her last eye exam in 2016 showed no DR and her LDL was 82 on last check which is near goal.   Plan Continue diet control Stress good blood pressure control.

## 2015-08-05 NOTE — Assessment & Plan Note (Signed)
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  She is noted to be on imipramine and trazodone for sleep at night, but she is not sure which one she takes.   Plan Bring in medications at next visit for review.

## 2015-08-05 NOTE — Assessment & Plan Note (Signed)
Breathing doing well today.  No acute issues.    Continue advair.

## 2015-08-05 NOTE — Assessment & Plan Note (Signed)
Improved today.  She does reports concern for asymmetry, but could not elicit much difference on my exam today.  Continue to monitor.  She has no pain, no change in color.  Pulses are intact in the DPs bilaterally.    She notes that she is taking lasix without issues.   Plan Continue lasix.

## 2015-08-05 NOTE — Assessment & Plan Note (Signed)
BP today was initially elevated, then improved on recheck.  She is not having chest pain or headaches which are concerning at this time.   Plan:  Continue benazepril, lasix.  BMET at next visit.

## 2015-08-06 ENCOUNTER — Other Ambulatory Visit: Payer: Self-pay | Admitting: *Deleted

## 2015-08-06 DIAGNOSIS — L988 Other specified disorders of the skin and subcutaneous tissue: Secondary | ICD-10-CM

## 2015-08-09 MED ORDER — FLUTICASONE PROPIONATE 50 MCG/ACT NA SUSP: 1.0000 | Freq: Every day | NASAL | 3 refills | Status: DC

## 2015-08-09 NOTE — Telephone Encounter (Signed)
I refilled the flonase. I did not refill the kenalog - couldn't find reason. Dr Daryll Drown back tomorrow to address

## 2015-08-10 MED ORDER — TRIAMCINOLONE ACETONIDE 0.1 % EX CREA: TOPICAL_CREAM | Freq: Two times a day (BID) | CUTANEOUS | 1 refills | Status: DC

## 2015-08-10 NOTE — Addendum Note (Signed)
Addended by: Gilles Chiquito B on: 08/10/2015 01:48 PM   Modules accepted: Orders

## 2015-09-08 ENCOUNTER — Ambulatory Visit (INDEPENDENT_AMBULATORY_CARE_PROVIDER_SITE_OTHER): Payer: PPO | Admitting: Internal Medicine

## 2015-09-08 ENCOUNTER — Encounter: Payer: Self-pay | Admitting: Internal Medicine

## 2015-09-08 DIAGNOSIS — M150 Primary generalized (osteo)arthritis: Secondary | ICD-10-CM

## 2015-09-08 DIAGNOSIS — M159 Polyosteoarthritis, unspecified: Secondary | ICD-10-CM

## 2015-09-08 DIAGNOSIS — K219 Gastro-esophageal reflux disease without esophagitis: Secondary | ICD-10-CM

## 2015-09-08 DIAGNOSIS — I10 Essential (primary) hypertension: Secondary | ICD-10-CM

## 2015-09-08 DIAGNOSIS — J302 Other seasonal allergic rhinitis: Secondary | ICD-10-CM | POA: Diagnosis not present

## 2015-09-08 DIAGNOSIS — M1990 Unspecified osteoarthritis, unspecified site: Secondary | ICD-10-CM | POA: Diagnosis not present

## 2015-09-08 DIAGNOSIS — Z79899 Other long term (current) drug therapy: Secondary | ICD-10-CM

## 2015-09-08 DIAGNOSIS — M8949 Other hypertrophic osteoarthropathy, multiple sites: Secondary | ICD-10-CM

## 2015-09-08 DIAGNOSIS — F325 Major depressive disorder, single episode, in full remission: Secondary | ICD-10-CM

## 2015-09-08 NOTE — Assessment & Plan Note (Signed)
Well controlled today.   Continue flonase and loratadine.

## 2015-09-08 NOTE — Assessment & Plan Note (Signed)
She follows at the [facility] center.  Unfortunately she did not bring in her medications to review .  She reports taking depakote, trazodone from them.  I asked her to bring them in next time.   Plan Continue current therapy as prescribed at [facility].

## 2015-09-08 NOTE — Assessment & Plan Note (Signed)
She reports that her pain is improved compared to normal.  She has had decreased exercise since having surgery on her neck.  She is no longer taking vicoden or tylenol regularly.   Plan Advised OTC tylenol as needed.

## 2015-09-08 NOTE — Assessment & Plan Note (Signed)
She brought in her medication today and she is taking ranitidine with good results.   Plan Continue ranitidine.

## 2015-09-08 NOTE — Patient Instructions (Addendum)
Mariah Henderson - -  Thank you for bringing your blood pressure medication in today.   Please keep taking all of your medication as prescribed.   I will give you an prescription for a blood pressure cuff to see if your insurance will cover the cost.  Please let me know if it is too expensive.   Thank you!  Please come back to see me in 4 months.

## 2015-09-08 NOTE — Progress Notes (Signed)
   Subjective:    [name], female    DOB: 1950-11-05, 65 y.o.   MRN: [id number]  Cc: 1 month follow up for medication review.   HPI   [name] is a 65yo woman with PMH of reactive airway disease, GERD, depression, allergy, glaucoma, HTN, DM who presents for follow up after last visit for medication review.   Unfortunately [name] only brought in medications which were prescribed by me.  She did not bring in her breathing therapies, her mood medications and some others.  I reviewed what was available.  She is taking ranitidine for her GERD.  She is on benazepril/lasix.  She is taking her statin.    Her BP was elevated on initial check, but improved to 130/80 on recheck.  She was very concerned about her BP and was relieved to see that it improved.  She requested a BP cuff at home which I provided to her by prescription.    She is not smoking.   Review of Systems  Constitutional: Negative for activity change and appetite change.  Respiratory: Negative for cough and shortness of breath.   Cardiovascular: Positive for leg swelling (occasional). Negative for chest pain.  Psychiatric/Behavioral: Negative for confusion and decreased concentration.       Objective:   Physical Exam  Constitutional: She is oriented to person, place, and time. She appears well-developed and well-nourished. No distress.  HENT:  Head: Normocephalic and atraumatic.  Cardiovascular: Normal rate, regular rhythm and normal heart sounds.   No murmur heard. Pulmonary/Chest: Effort normal and breath sounds normal. No respiratory distress.  Neurological: She is alert and oriented to person, place, and time.  Psychiatric: She has a normal mood and affect. Her behavior is normal.   No labs today     Assessment & Plan:  RTC in 4 months for DM and HTN.

## 2015-09-08 NOTE — Assessment & Plan Note (Signed)
BP improved to 130/80.  She was relieved by this.  No chest pain or headaches.   Plan BP cuff per patient request Continue benazepril and lasix No need for BMET, K and Cr were WNL in April of this year.  Consider BMET at next visit.

## 2015-09-30 LAB — HM DIABETES EYE EXAM

## 2015-10-01 ENCOUNTER — Other Ambulatory Visit: Payer: Self-pay | Admitting: Internal Medicine

## 2015-10-01 DIAGNOSIS — J302 Other seasonal allergic rhinitis: Secondary | ICD-10-CM

## 2015-10-20 ENCOUNTER — Encounter: Payer: Self-pay | Admitting: *Deleted

## 2015-11-28 ENCOUNTER — Encounter (HOSPITAL_COMMUNITY): Payer: Self-pay

## 2015-11-28 ENCOUNTER — Emergency Department (HOSPITAL_COMMUNITY): Payer: PPO

## 2015-11-28 ENCOUNTER — Emergency Department (HOSPITAL_COMMUNITY)
Admission: EM | Admit: 2015-11-28 | Discharge: 2015-11-28 | Disposition: A | Discharge status: Home / Self Care | Payer: PPO | Attending: Emergency Medicine | Admitting: Emergency Medicine

## 2015-11-28 DIAGNOSIS — S161XXA Strain of muscle, fascia and tendon at neck level, initial encounter: Secondary | ICD-10-CM | POA: Diagnosis not present

## 2015-11-28 DIAGNOSIS — Y999 Unspecified external cause status: Secondary | ICD-10-CM | POA: Diagnosis not present

## 2015-11-28 DIAGNOSIS — Z79899 Other long term (current) drug therapy: Secondary | ICD-10-CM | POA: Diagnosis not present

## 2015-11-28 DIAGNOSIS — E119 Type 2 diabetes mellitus without complications: Secondary | ICD-10-CM | POA: Diagnosis not present

## 2015-11-28 DIAGNOSIS — Y92410 Unspecified street and highway as the place of occurrence of the external cause: Secondary | ICD-10-CM | POA: Insufficient documentation

## 2015-11-28 DIAGNOSIS — Z87891 Personal history of nicotine dependence: Secondary | ICD-10-CM | POA: Insufficient documentation

## 2015-11-28 DIAGNOSIS — I10 Essential (primary) hypertension: Secondary | ICD-10-CM | POA: Diagnosis not present

## 2015-11-28 DIAGNOSIS — Y939 Activity, unspecified: Secondary | ICD-10-CM | POA: Diagnosis not present

## 2015-11-28 DIAGNOSIS — J449 Chronic obstructive pulmonary disease, unspecified: Secondary | ICD-10-CM | POA: Insufficient documentation

## 2015-11-28 DIAGNOSIS — S199XXA Unspecified injury of neck, initial encounter: Secondary | ICD-10-CM | POA: Diagnosis present

## 2015-11-28 MED ORDER — DIAZEPAM 5 MG PO TABS: 5.0000 mg | ORAL_TABLET | Freq: Once | ORAL | Status: DC

## 2015-11-28 MED ORDER — DIAZEPAM 2 MG PO TABS
2.0000 mg | ORAL_TABLET | Freq: Once | ORAL | Status: AC
  Administered 2015-11-28: 2 mg via ORAL
  Filled 2015-11-28: qty 1

## 2015-11-28 MED ORDER — METHOCARBAMOL 500 MG PO TABS: 500.0000 mg | ORAL_TABLET | Freq: Two times a day (BID) | ORAL | 0 refills | Status: DC

## 2015-11-28 MED ORDER — HYDROCODONE-ACETAMINOPHEN 5-325 MG PO TABS
1.0000 | ORAL_TABLET | Freq: Once | ORAL | Status: AC
  Administered 2015-11-28: 1 via ORAL
  Filled 2015-11-28: qty 1

## 2015-11-28 MED ORDER — HYDROCODONE-ACETAMINOPHEN 5-325 MG PO TABS: 1.0000 | ORAL_TABLET | ORAL | 0 refills | Status: DC | PRN
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
## 2015-12-01 ENCOUNTER — Ambulatory Visit (HOSPITAL_COMMUNITY)
Admission: RE | Admit: 2015-12-01 | Discharge: 2015-12-01 | Disposition: A | Discharge status: Home / Self Care | Payer: PPO | Source: Ambulatory Visit | Attending: Internal Medicine | Admitting: Internal Medicine

## 2015-12-01 ENCOUNTER — Ambulatory Visit (INDEPENDENT_AMBULATORY_CARE_PROVIDER_SITE_OTHER): Payer: PPO | Admitting: Internal Medicine

## 2015-12-01 ENCOUNTER — Encounter: Payer: Self-pay | Admitting: Internal Medicine

## 2015-12-01 VITALS — BP 152/70 | HR 69 | Temp 98.0°F | Ht <= 58 in | Wt 188.9 lb

## 2015-12-01 DIAGNOSIS — M150 Primary generalized (osteo)arthritis: Principal | ICD-10-CM

## 2015-12-01 DIAGNOSIS — M791 Myalgia: Secondary | ICD-10-CM | POA: Diagnosis not present

## 2015-12-01 DIAGNOSIS — M25511 Pain in right shoulder: Secondary | ICD-10-CM

## 2015-12-01 DIAGNOSIS — Z23 Encounter for immunization: Secondary | ICD-10-CM | POA: Diagnosis not present

## 2015-12-01 DIAGNOSIS — M8949 Other hypertrophic osteoarthropathy, multiple sites: Secondary | ICD-10-CM

## 2015-12-01 DIAGNOSIS — Z79899 Other long term (current) drug therapy: Secondary | ICD-10-CM

## 2015-12-01 DIAGNOSIS — M159 Polyosteoarthritis, unspecified: Secondary | ICD-10-CM

## 2015-12-01 DIAGNOSIS — I10 Essential (primary) hypertension: Secondary | ICD-10-CM | POA: Diagnosis not present

## 2015-12-01 DIAGNOSIS — E119 Type 2 diabetes mellitus without complications: Secondary | ICD-10-CM

## 2015-12-01 DIAGNOSIS — Z87891 Personal history of nicotine dependence: Secondary | ICD-10-CM

## 2015-12-01 DIAGNOSIS — M19011 Primary osteoarthritis, right shoulder: Secondary | ICD-10-CM | POA: Insufficient documentation

## 2015-12-01 LAB — POCT GLYCOSYLATED HEMOGLOBIN (HGB A1C): Hemoglobin A1C: 6.2

## 2015-12-01 LAB — GLUCOSE, CAPILLARY: Glucose-Capillary: 117 mg/dL — ABNORMAL HIGH (ref 65–99)

## 2015-12-01 NOTE — Assessment & Plan Note (Signed)
BP is elevated today.  I think the main reasons are active pain and she did not have the chance to take her medications today.  She is on benazapril and has had good BP in the past.  Will need to recheck in future to see if this is a trend in the wrong direction and requires further modification of regimen.   Plan Continue benazapril at current dose, encouraged to continue taking while in pain.  Renal function stable at last check.
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
## 2015-12-01 NOTE — Assessment & Plan Note (Addendum)
A1C continues to be < 7.0 on diet only.  Continue to monitor.  We did not have much of a chance to discuss this issue given above.   She brought in her BS log for me to review.  Glucose average is 121 and she has very few levels higher than that.  I congratulated her on good control.

## 2015-12-06 ENCOUNTER — Telehealth: Payer: Self-pay | Admitting: Internal Medicine
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
## 2015-12-14 ENCOUNTER — Telehealth: Payer: Self-pay | Admitting: Internal Medicine

## 2015-12-14 NOTE — Telephone Encounter (Signed)
APT. REMINDER CALL, LMTCB °

## 2015-12-15 ENCOUNTER — Encounter: Payer: Self-pay | Admitting: Internal Medicine

## 2015-12-15 ENCOUNTER — Ambulatory Visit (INDEPENDENT_AMBULATORY_CARE_PROVIDER_SITE_OTHER): Payer: PPO | Admitting: Internal Medicine

## 2015-12-15 DIAGNOSIS — R0789 Other chest pain: Secondary | ICD-10-CM

## 2015-12-15 MED ORDER — DICLOFENAC SODIUM 1 % TD GEL: 2.0000 g | Freq: Four times a day (QID) | TRANSDERMAL | 0 refills | Status: DC | PRN

## 2015-12-15 NOTE — Progress Notes (Signed)
   CC: left chest wall pain  HPI:  [name] is a 65 y.o. female who presents today for left chest wall pain. Please see assessment & plan for status of chronic medical problems.   Past Medical History:  Diagnosis Date  . Anxiety   . Asthma   . Cervical spondylosis   . Chronic serous otitis media   . Constipation   . COPD (chronic obstructive pulmonary disease) ([place])   . Depression    followed by [name]; no longer of depakote, seroquel  . Diabetes mellitus    Diet controlled  . Foot drop   . GERD (gastroesophageal reflux disease)   . Heart murmur   . Hyperlipidemia   . Hypertension   . Low back pain   . Obesity   . Paraumbilical hernia     Review of Systems:  Please see each problem below for a pertinent review of systems.  Physical Exam:  Vitals:   12/15/15 1458  BP: 121/66  Pulse: 70  Temp: 97.4 F (36.3 C)  TempSrc: Oral  SpO2: 100%  Weight: 191 lb (86.6 kg)   Physical Exam  Constitutional: She is oriented to person, place, and time. No distress.  HENT:  Head: Normocephalic and atraumatic.  Eyes: Conjunctivae are normal. No scleral icterus.  Cardiovascular: Normal rate and regular rhythm.   Pulmonary/Chest: She exhibits tenderness (Left chest wall, just inferior to the clavicle).  Musculoskeletal:  5/5 upper extremity strength bilaterally. Some difficulty with hand grip of the right hand as compared to the left.  Neurological: She is alert and oriented to person, place, and time.  Skin: She is not diaphoretic.    Assessment & Plan:   See Encounters Tab for problem based charting.  Patient discussed with Dr. Dareen Piano
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
## 2015-12-19 NOTE — Progress Notes (Signed)
Internal Medicine Clinic Attending  Case discussed with Dr. Patel,Rushil at the time of the visit.  We reviewed the resident's history and exam and pertinent patient test results.  I agree with the assessment, diagnosis, and plan of care documented in the resident's note.  

## 2015-12-20 ENCOUNTER — Other Ambulatory Visit: Payer: Self-pay | Admitting: Internal Medicine

## 2015-12-20 NOTE — Telephone Encounter (Signed)
Patient calling for her  Test Strips Refill.

## 2015-12-21 LAB — HM DIABETES EYE EXAM

## 2015-12-25 ENCOUNTER — Other Ambulatory Visit: Payer: Self-pay | Admitting: Internal Medicine

## 2016-01-06 ENCOUNTER — Telehealth: Payer: Self-pay | Admitting: Internal Medicine

## 2016-01-06 DIAGNOSIS — M25511 Pain in right shoulder: Secondary | ICD-10-CM
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
## 2016-01-20 ENCOUNTER — Encounter: Payer: Self-pay | Admitting: *Deleted

## 2016-01-24 ENCOUNTER — Ambulatory Visit: Payer: PPO

## 2016-01-24 ENCOUNTER — Other Ambulatory Visit: Payer: Self-pay | Admitting: Internal Medicine

## 2016-01-27 DIAGNOSIS — R69 Illness, unspecified: Secondary | ICD-10-CM | POA: Diagnosis not present

## 2016-01-31 ENCOUNTER — Ambulatory Visit: Payer: Medicare HMO | Attending: Internal Medicine

## 2016-01-31 DIAGNOSIS — R293 Abnormal posture: Secondary | ICD-10-CM | POA: Diagnosis present

## 2016-01-31 DIAGNOSIS — M25612 Stiffness of left shoulder, not elsewhere classified: Secondary | ICD-10-CM | POA: Diagnosis present

## 2016-01-31 DIAGNOSIS — M25511 Pain in right shoulder: Secondary | ICD-10-CM | POA: Diagnosis not present

## 2016-01-31 DIAGNOSIS — G8929 Other chronic pain: Secondary | ICD-10-CM | POA: Diagnosis present

## 2016-01-31 DIAGNOSIS — M6281 Muscle weakness (generalized): Secondary | ICD-10-CM | POA: Diagnosis present

## 2016-01-31 DIAGNOSIS — M25611 Stiffness of right shoulder, not elsewhere classified: Secondary | ICD-10-CM | POA: Insufficient documentation
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
## 2016-01-31 NOTE — Patient Instructions (Signed)
Retraction: Scapula - Bilateral    Facing pulley, straps around shoulders, pull straps back by pinching shoulder blades together. Repeat  5-10____ times per set. Do __2__ sets per session. Do __3__ sessions per day.  Copyright  VHI. All rights reserved.

## 2016-02-01 ENCOUNTER — Other Ambulatory Visit: Payer: Self-pay | Admitting: Internal Medicine

## 2016-02-01 NOTE — Telephone Encounter (Signed)
Spoke w/pcp-she will review medication for refill.Despina Hidden Cassady1/16/20184:43 PM

## 2016-02-01 NOTE — Telephone Encounter (Signed)
I stopped her HCTZ quite a while ago given that she is also on lasix.  She may have continued to take it, but will not refill at this time until I see her in clinic.  Please have her call me if her BP were to go too high.

## 2016-02-04 NOTE — Telephone Encounter (Signed)
Spoke with patient, she is NOT taking hctz, states pharmacy sent request in error. She's  taking lasix in addition to the benazepril.  No further action needed at this time, phone call complete.Marland KitchenMarland KitchenDespina Hidden Cassady1/19/20183:28 PM

## 2016-02-07 ENCOUNTER — Ambulatory Visit: Payer: Medicare HMO

## 2016-02-08 DIAGNOSIS — R69 Illness, unspecified: Secondary | ICD-10-CM | POA: Diagnosis not present

## 2016-02-14 ENCOUNTER — Encounter: Payer: Self-pay | Admitting: Physical Therapy

## 2016-02-14 ENCOUNTER — Ambulatory Visit: Payer: Medicare HMO | Admitting: Physical Therapy

## 2016-02-14 DIAGNOSIS — M25611 Stiffness of right shoulder, not elsewhere classified: Secondary | ICD-10-CM

## 2016-02-14 DIAGNOSIS — M25511 Pain in right shoulder: Secondary | ICD-10-CM | POA: Diagnosis not present

## 2016-02-14 DIAGNOSIS — G8929 Other chronic pain: Secondary | ICD-10-CM

## 2016-02-14 DIAGNOSIS — M6281 Muscle weakness (generalized): Secondary | ICD-10-CM

## 2016-02-14 DIAGNOSIS — M25612 Stiffness of left shoulder, not elsewhere classified: Secondary | ICD-10-CM

## 2016-02-14 DIAGNOSIS — R293 Abnormal posture: Secondary | ICD-10-CM
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
## 2016-02-14 NOTE — Patient Instructions (Addendum)
Posture ed sitting ,supine.  Verbal,  From exercise drawer:   Neck retraction Scapular retraction  1-2 x a day 5 x each 3-5 seconds hold. Be sure to sit with good posture.

## 2016-02-17 ENCOUNTER — Ambulatory Visit: Payer: Medicare HMO | Attending: Internal Medicine | Admitting: Physical Therapy

## 2016-02-17 DIAGNOSIS — M25511 Pain in right shoulder: Secondary | ICD-10-CM | POA: Insufficient documentation

## 2016-02-17 DIAGNOSIS — G8929 Other chronic pain: Secondary | ICD-10-CM | POA: Insufficient documentation

## 2016-02-17 DIAGNOSIS — M6281 Muscle weakness (generalized): Secondary | ICD-10-CM | POA: Insufficient documentation

## 2016-02-17 DIAGNOSIS — M25611 Stiffness of right shoulder, not elsewhere classified: Secondary | ICD-10-CM | POA: Diagnosis present

## 2016-02-17 DIAGNOSIS — R293 Abnormal posture: Secondary | ICD-10-CM

## 2016-02-17 DIAGNOSIS — M25612 Stiffness of left shoulder, not elsewhere classified: Secondary | ICD-10-CM | POA: Diagnosis present

## 2016-02-17 NOTE — Therapy (Signed)
[place], [place], [address] Phone: [phone]   Fax:  [phone]  Physical Therapy Treatment  Patient Details  Name: Mariah Henderson MRN: [id number] Date of Birth: 1950/03/15 Referring Provider: Gilles Chiquito, MD  Encounter Date: 02/17/2016      PT End of Session - 02/17/16 1425    Visit Number 3   Number of Visits 12   Date for PT Re-Evaluation 03/13/16   PT Start Time 1330   PT Stop Time 1415   PT Time Calculation (min) 45 min   Activity Tolerance Patient tolerated treatment well   Behavior During Therapy [facility] for tasks assessed/performed      Past Medical History:  Diagnosis Date  . Anxiety   . Asthma   . Cervical spondylosis   . Chronic serous otitis media   . Constipation   . COPD (chronic obstructive pulmonary disease) ([place])   . Depression    followed by [name]; no longer of depakote, seroquel  . Diabetes mellitus    Diet controlled  . Foot drop   . GERD (gastroesophageal reflux disease)   . Heart murmur   . Hyperlipidemia   . Hypertension   . Low back pain   . Obesity   . Paraumbilical hernia     Past Surgical History:  Procedure Laterality Date  . ABDOMINAL HYSTERECTOMY    . ANTERIOR CERVICAL DECOMP/DISCECTOMY FUSION N/A 10/05/2014   Procedure: ACDF - C5-C6 - C6-C7;  Surgeon: Karie Chimera, MD;  Location: [facility];  Service: Neurosurgery;  Laterality: N/A;  ACDF - C5-C6 - C6-C7  . CHOLECYSTECTOMY    . COLONOSCOPY    . FOOT SURGERY    . HERNIA REPAIR  1999  . RADIOACTIVE SEED GUIDED EXCISIONAL BREAST BIOPSY Left 06/23/2014   Procedure: RADIOACTIVE SEED GUIDED EXCISIONAL BREAST BIOPSY;  Surgeon: Stark Klein, MD;  Location: [facility];  Service: General;  Laterality: Left;    There were no vitals filed for this visit.      Subjective Assessment - 02/17/16 1316    Subjective Pain is the same.  OK with doing her exercises.  They make me sleepy.    Currently in Pain? Yes   Pain Score 5    Pain Location Shoulder            [facility] PT Assessment - 02/17/16 0001      PROM   Right Shoulder Flexion 76 Degrees  limited by pain, pulleys                     [facility] Adult PT Treatment/Exercise - 02/17/16 0001      Shoulder Exercises: Seated   Retraction 5 reps   Theraband Level (Shoulder Row) Level 1 (Yellow)   Row Limitations Painful right so stopped   External Rotation 10 reps   Theraband Level (Shoulder External Rotation) Level 1 (Yellow)   External Rotation Limitations HEP   Other Seated Exercises UE ranger small circles both ways,      Shoulder Exercises: ROM/Strengthening   UBE (Upper Arm Bike) Nu step L4 8 minutes UE/LE   Other ROM/Strengthening Exercises pulleys 3 minutes small motions     Shoulder Exercises: Isometric Strengthening   Flexion Limitations Too painful   Extension Limitations Too painful   External Rotation Limitations 10 X 5 seconds   Internal Rotation Limitations 10 X 5 seconds     Manual Therapy   Manual therapy comments [email or web address] X pattern on  Mariah Henderson MRN: [id number] Date of Birth: 1950/02/19  Mariah Henderson MRN: [id number] Date of Birth: 1950/02/19

## 2016-02-17 NOTE — Patient Instructions (Signed)
Remove tape if irritating 

## 2016-02-21 ENCOUNTER — Ambulatory Visit: Payer: Medicare HMO | Admitting: Physical Therapy

## 2016-02-21 DIAGNOSIS — M25612 Stiffness of left shoulder, not elsewhere classified: Secondary | ICD-10-CM

## 2016-02-21 DIAGNOSIS — G8929 Other chronic pain: Secondary | ICD-10-CM

## 2016-02-21 DIAGNOSIS — M25511 Pain in right shoulder: Principal | ICD-10-CM

## 2016-02-21 DIAGNOSIS — R293 Abnormal posture: Secondary | ICD-10-CM

## 2016-02-21 DIAGNOSIS — M25611 Stiffness of right shoulder, not elsewhere classified: Secondary | ICD-10-CM

## 2016-02-21 DIAGNOSIS — M6281 Muscle weakness (generalized): Secondary | ICD-10-CM

## 2016-02-21 NOTE — Patient Instructions (Signed)
Hand written instructions on how to apply tape.  Extra copy of her previous HEP re-issued.  With no changes.

## 2016-02-21 NOTE — Therapy (Signed)
type 2 diabetes mellitus ([place]) 05/26/2011  . Glaucoma 05/26/2011  . Osteoarthritis 06/30/2008  . Allergic rhinitis 05/01/2008  . HLD (hyperlipidemia) 12/06/2005  . Class 2 obesity with body mass index (BMI) of 35 to 39.9 without comorbidity 12/06/2005  . Depression, major, 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[place]) 12/06/2005  . Essential hypertension 12/06/2005    Militza Devery PTA 02/21/2016, 5:36 PM  [facility] [address] [place], [place], [address] Phone: [phone]   Fax:  [phone]  Name: Mariah Henderson MRN: [id number] Date of Birth: September 12, 1950  [place], [place], [address] Phone: [phone]   Fax:  [phone]  Physical Therapy Treatment  Patient Details  Name: Mariah Henderson MRN: [id number] Date of Birth: [birth year] Referring Provider: Gilles Chiquito, MD  Encounter Date: 02/21/2016      PT End of Session - 02/21/16 1732    Visit Number 4   Number of Visits 12   Date for PT Re-Evaluation 03/13/16   PT Start Time 64   PT Stop Time 1720   PT Time Calculation (min) 50 min   Activity Tolerance Patient tolerated treatment well   Behavior During Therapy [facility] for tasks assessed/performed      Past Medical History:  Diagnosis Date  . Anxiety   . Asthma   . Cervical spondylosis   . Chronic serous otitis media   . Constipation   . COPD (chronic obstructive pulmonary disease) ([place])   . Depression    followed by [name]; no longer of depakote, seroquel  . Diabetes mellitus    Diet controlled  . Foot drop   . GERD (gastroesophageal reflux disease)   . Heart murmur   . Hyperlipidemia   . Hypertension   . Low back pain   . Obesity   . Paraumbilical hernia     Past Surgical History:  Procedure Laterality Date  . ABDOMINAL HYSTERECTOMY    . ANTERIOR CERVICAL DECOMP/DISCECTOMY FUSION N/A 10/05/2014   Procedure: ACDF - C5-C6 - C6-C7;  Surgeon: Karie Chimera, MD;  Location: [place];  Service: Neurosurgery;  Laterality: N/A;  ACDF - C5-C6 - C6-C7  . CHOLECYSTECTOMY    . COLONOSCOPY    . FOOT SURGERY    . HERNIA REPAIR  1999  . RADIOACTIVE SEED GUIDED EXCISIONAL BREAST BIOPSY Left 06/23/2014   Procedure: RADIOACTIVE SEED GUIDED EXCISIONAL BREAST BIOPSY;  Surgeon: Stark Klein, MD;  Location: [facility];  Service: General;  Laterality: Left;    There were no vitals filed for this visit.      Subjective Assessment - 02/21/16 1637    Subjective The tape came off in the shower.   It helped a lot.  I could not find it in  the drugstore.    I lost my home exercises.   Currently in Pain? Yes   Pain Score 5    Pain Location Shoulder   Pain Orientation Right   Pain Descriptors / Indicators Constant   Pain Type Chronic pain   Pain Radiating Towards Neck   Aggravating Factors  lying on her shoulder   Pain Relieving Factors ibuprophen, tape, heat laying on her back, using pillows                         [facility] Adult PT Treatment/Exercise - 02/21/16 0001      Shoulder Exercises: Seated   Retraction 5 reps  5 second holds   External Rotation 20 reps   Theraband Level (Shoulder External Rotation) Level 1 (Yellow)   External Rotation Limitations cues  needed to be pain free     Shoulder Exercises: Isometric Strengthening   Flexion Limitations 5x5  mild pain   Extension Limitations 5 X 5  mi;ld pain   External Rotation Limitations 10 X 5 seconds   Internal Rotation Limitations 10 X 5 seconds     Manual Therapy   Manual therapy comments [email or web address] X pattern on clavicle to create space 50%  extra time spent teaching patient  type 2 diabetes mellitus ([place]) 05/26/2011  . Glaucoma 05/26/2011  . Osteoarthritis 06/30/2008  . Allergic rhinitis 05/01/2008  . HLD (hyperlipidemia) 12/06/2005  . Class 2 obesity with body mass index (BMI) of 35 to 39.9 without comorbidity 12/06/2005  . Depression, major, 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[place]) 12/06/2005  . Essential hypertension 12/06/2005    Militza Devery PTA 02/21/2016, 5:36 PM  [facility] [address] [place], [place], [address] Phone: [phone]   Fax:  [phone]  Name: Mariah Henderson MRN: [id number] Date of Birth: September 12, 1950

## 2016-02-22 DIAGNOSIS — H40031 Anatomical narrow angle, right eye: Secondary | ICD-10-CM | POA: Diagnosis not present

## 2016-02-22 DIAGNOSIS — I10 Essential (primary) hypertension: Secondary | ICD-10-CM | POA: Diagnosis not present

## 2016-02-22 DIAGNOSIS — H40013 Open angle with borderline findings, low risk, bilateral: Secondary | ICD-10-CM | POA: Diagnosis not present

## 2016-02-22 DIAGNOSIS — H10413 Chronic giant papillary conjunctivitis, bilateral: Secondary | ICD-10-CM | POA: Diagnosis not present

## 2016-02-22 DIAGNOSIS — H04123 Dry eye syndrome of bilateral lacrimal glands: Secondary | ICD-10-CM | POA: Diagnosis not present

## 2016-02-22 DIAGNOSIS — K219 Gastro-esophageal reflux disease without esophagitis: Secondary | ICD-10-CM | POA: Diagnosis not present

## 2016-02-22 DIAGNOSIS — H2513 Age-related nuclear cataract, bilateral: Secondary | ICD-10-CM | POA: Diagnosis not present

## 2016-02-22 DIAGNOSIS — R42 Dizziness and giddiness: Secondary | ICD-10-CM | POA: Diagnosis not present

## 2016-02-22 DIAGNOSIS — L299 Pruritus, unspecified: Secondary | ICD-10-CM | POA: Diagnosis not present

## 2016-02-24 ENCOUNTER — Ambulatory Visit: Payer: Medicare HMO | Admitting: Physical Therapy

## 2016-02-24 DIAGNOSIS — M25612 Stiffness of left shoulder, not elsewhere classified: Secondary | ICD-10-CM

## 2016-02-24 DIAGNOSIS — M25511 Pain in right shoulder: Principal | ICD-10-CM

## 2016-02-24 DIAGNOSIS — M6281 Muscle weakness (generalized): Secondary | ICD-10-CM

## 2016-02-24 DIAGNOSIS — M25611 Stiffness of right shoulder, not elsewhere classified: Secondary | ICD-10-CM

## 2016-02-24 DIAGNOSIS — R293 Abnormal posture: Secondary | ICD-10-CM

## 2016-02-24 DIAGNOSIS — G8929 Other chronic pain: Secondary | ICD-10-CM
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
## 2016-02-28 ENCOUNTER — Ambulatory Visit: Payer: Medicare HMO | Admitting: Physical Therapy

## 2016-02-28 DIAGNOSIS — M6281 Muscle weakness (generalized): Secondary | ICD-10-CM

## 2016-02-28 DIAGNOSIS — G8929 Other chronic pain: Secondary | ICD-10-CM

## 2016-02-28 DIAGNOSIS — M25612 Stiffness of left shoulder, not elsewhere classified: Secondary | ICD-10-CM

## 2016-02-28 DIAGNOSIS — M25511 Pain in right shoulder: Secondary | ICD-10-CM | POA: Diagnosis not present

## 2016-02-28 DIAGNOSIS — R293 Abnormal posture: Secondary | ICD-10-CM

## 2016-02-28 DIAGNOSIS — M25611 Stiffness of right shoulder, not elsewhere classified: Secondary | ICD-10-CM

## 2016-02-28 NOTE — Patient Instructions (Signed)
[place]  4 issued from exercise drawer. All 4 issued Daily 5-10 x each Rolled towel for IR/ER Cued to keep shoulders away from ears. Pain free
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
## 2016-03-02 ENCOUNTER — Ambulatory Visit: Payer: Medicare HMO

## 2016-03-02 DIAGNOSIS — R293 Abnormal posture: Secondary | ICD-10-CM

## 2016-03-02 DIAGNOSIS — M25511 Pain in right shoulder: Secondary | ICD-10-CM | POA: Diagnosis not present

## 2016-03-02 DIAGNOSIS — M25612 Stiffness of left shoulder, not elsewhere classified: Secondary | ICD-10-CM

## 2016-03-02 DIAGNOSIS — G8929 Other chronic pain: Secondary | ICD-10-CM

## 2016-03-02 DIAGNOSIS — M25611 Stiffness of right shoulder, not elsewhere classified: Secondary | ICD-10-CM

## 2016-03-02 DIAGNOSIS — M6281 Muscle weakness (generalized): Secondary | ICD-10-CM
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
## 2016-03-07 ENCOUNTER — Ambulatory Visit: Payer: Medicare HMO | Admitting: Physical Therapy

## 2016-03-07 DIAGNOSIS — M25611 Stiffness of right shoulder, not elsewhere classified: Secondary | ICD-10-CM

## 2016-03-07 DIAGNOSIS — M25612 Stiffness of left shoulder, not elsewhere classified: Secondary | ICD-10-CM

## 2016-03-07 DIAGNOSIS — G8929 Other chronic pain: Secondary | ICD-10-CM

## 2016-03-07 DIAGNOSIS — M25511 Pain in right shoulder: Secondary | ICD-10-CM | POA: Diagnosis not present

## 2016-03-07 DIAGNOSIS — M6281 Muscle weakness (generalized): Secondary | ICD-10-CM

## 2016-03-07 DIAGNOSIS — R293 Abnormal posture: Secondary | ICD-10-CM
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
## 2016-03-07 NOTE — Addendum Note (Signed)
Addended by: Darrel Hoover on: 03/07/2016 04:42 PM   Modules accepted: Orders

## 2016-03-08 ENCOUNTER — Ambulatory Visit (HOSPITAL_COMMUNITY)
Admission: RE | Admit: 2016-03-08 | Discharge: 2016-03-08 | Disposition: A | Discharge status: Home / Self Care | Payer: Medicare HMO | Source: Ambulatory Visit | Attending: Internal Medicine | Admitting: Internal Medicine

## 2016-03-08 ENCOUNTER — Ambulatory Visit (INDEPENDENT_AMBULATORY_CARE_PROVIDER_SITE_OTHER): Payer: Medicare HMO | Admitting: Internal Medicine

## 2016-03-08 ENCOUNTER — Encounter: Payer: Self-pay | Admitting: Internal Medicine

## 2016-03-08 VITALS — BP 114/51 | HR 72 | Temp 98.1°F | Ht 60.0 in | Wt 189.4 lb

## 2016-03-08 DIAGNOSIS — Z8249 Family history of ischemic heart disease and other diseases of the circulatory system: Secondary | ICD-10-CM

## 2016-03-08 DIAGNOSIS — R9431 Abnormal electrocardiogram [ECG] [EKG]: Secondary | ICD-10-CM | POA: Diagnosis not present

## 2016-03-08 DIAGNOSIS — M19111 Post-traumatic osteoarthritis, right shoulder: Secondary | ICD-10-CM | POA: Diagnosis not present

## 2016-03-08 DIAGNOSIS — R0789 Other chest pain: Secondary | ICD-10-CM | POA: Diagnosis not present

## 2016-03-08 DIAGNOSIS — M8949 Other hypertrophic osteoarthropathy, multiple sites: Secondary | ICD-10-CM

## 2016-03-08 DIAGNOSIS — J301 Allergic rhinitis due to pollen: Secondary | ICD-10-CM

## 2016-03-08 DIAGNOSIS — Z79899 Other long term (current) drug therapy: Secondary | ICD-10-CM

## 2016-03-08 DIAGNOSIS — J309 Allergic rhinitis, unspecified: Secondary | ICD-10-CM | POA: Diagnosis not present

## 2016-03-08 DIAGNOSIS — E119 Type 2 diabetes mellitus without complications: Secondary | ICD-10-CM

## 2016-03-08 DIAGNOSIS — R079 Chest pain, unspecified: Secondary | ICD-10-CM | POA: Insufficient documentation

## 2016-03-08 DIAGNOSIS — R69 Illness, unspecified: Secondary | ICD-10-CM | POA: Diagnosis not present

## 2016-03-08 DIAGNOSIS — Z87891 Personal history of nicotine dependence: Secondary | ICD-10-CM | POA: Diagnosis not present

## 2016-03-08 DIAGNOSIS — R42 Dizziness and giddiness: Secondary | ICD-10-CM

## 2016-03-08 DIAGNOSIS — F4320 Adjustment disorder, unspecified: Secondary | ICD-10-CM

## 2016-03-08 DIAGNOSIS — M159 Polyosteoarthritis, unspecified: Secondary | ICD-10-CM

## 2016-03-08 DIAGNOSIS — M150 Primary generalized (osteo)arthritis: Secondary | ICD-10-CM

## 2016-03-08 DIAGNOSIS — K219 Gastro-esophageal reflux disease without esophagitis: Secondary | ICD-10-CM

## 2016-03-08 DIAGNOSIS — I10 Essential (primary) hypertension: Secondary | ICD-10-CM

## 2016-03-08 DIAGNOSIS — M25511 Pain in right shoulder: Secondary | ICD-10-CM | POA: Diagnosis not present

## 2016-03-08 LAB — GLUCOSE, CAPILLARY: Glucose-Capillary: 96 mg/dL (ref 65–99)

## 2016-03-08 LAB — POCT GLYCOSYLATED HEMOGLOBIN (HGB A1C): Hemoglobin A1C: 6.6

## 2016-03-08 MED ORDER — RANITIDINE HCL 150 MG PO TABS: 150.0000 mg | ORAL_TABLET | Freq: Two times a day (BID) | ORAL | 3 refills | Status: DC

## 2016-03-08 MED ORDER — TRAMADOL HCL 50 MG PO TABS: 50.0000 mg | ORAL_TABLET | Freq: Four times a day (QID) | ORAL | 3 refills | Status: DC | PRN

## 2016-03-08 NOTE — Patient Instructions (Addendum)
Mariah Henderson - -  Please continue taking all of your medications as prescribed.  Your diabetes and blood pressure are doing well.   For your shoulder pain, you can start taking tramadol again.  Once you are getting better, you may not need this medication anymore.   I have refilled your Zantac.   Please call with any questions.   Thank you!
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
## 2016-03-09 ENCOUNTER — Telehealth: Payer: Self-pay | Admitting: Internal Medicine

## 2016-03-09 ENCOUNTER — Ambulatory Visit: Payer: Medicare HMO | Admitting: Physical Therapy

## 2016-03-09 DIAGNOSIS — M25511 Pain in right shoulder: Principal | ICD-10-CM

## 2016-03-09 DIAGNOSIS — G8929 Other chronic pain: Secondary | ICD-10-CM

## 2016-03-09 DIAGNOSIS — M25611 Stiffness of right shoulder, not elsewhere classified: Secondary | ICD-10-CM

## 2016-03-09 DIAGNOSIS — M6281 Muscle weakness (generalized): Secondary | ICD-10-CM

## 2016-03-09 DIAGNOSIS — M25612 Stiffness of left shoulder, not elsewhere classified: Secondary | ICD-10-CM

## 2016-03-09 DIAGNOSIS — R293 Abnormal posture: Secondary | ICD-10-CM

## 2016-03-09 LAB — BMP8+ANION GAP
Anion Gap: 15 (ref 10.0–18.0)
BUN/Creatinine Ratio: 17 (ref 12–28)
BUN: 14 mg/dL (ref 8–27)
CO2: 27 mmol/L (ref 18–29)
Calcium: 9.7 mg/dL (ref 8.7–10.3)
Chloride: 100 mmol/L (ref 96–106)
Creatinine, Ser: 0.83 mg/dL (ref 0.57–1.00)
GFR calc Af Amer: 84 (ref >59)
GFR calc non Af Amer: 73 (ref >59)
Glucose: 120 mg/dL — ABNORMAL HIGH (ref 65–99)
Potassium: 4.5 mmol/L (ref 3.5–5.2)
Sodium: 142 mmol/L (ref 134–144)

## 2016-03-09 NOTE — Assessment & Plan Note (Signed)
No acute issues today.  Tolerating medications well.   Plan Continue prn flonase and claritin

## 2016-03-09 NOTE — Telephone Encounter (Signed)
Patient requesting Ranitidine instead of Zantac.  Patient states she was seen yesterday and states ENT office of Dr. Simeon Craft treated her in the pass.  Would you like for me to request her last ENT office notes to verify this information before sending this request to Triage refill.  Please advise.

## 2016-03-09 NOTE — Assessment & Plan Note (Signed)
BP today 115/51.  We checked orthostatic BP and these were normal (actually with elevated BPs).  129/68 --> 142/76 --> 144/72.    There was some concern that her dizziness could be related to orthostasis, but she is not actively orthostatic today.    Plan Continue benazapril.  Check BMET today.
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
## 2016-03-09 NOTE — Assessment & Plan Note (Signed)
Atypical.  Please see GERD problem

## 2016-03-09 NOTE — Assessment & Plan Note (Signed)
She is having increase symptoms of GERD and is out of her ranitidine.  She would like a refill.   She used to get this medication from her ENT doctor, but the new physician has declined refill.  I am happy to refill here.    She further described some atypical chest pain.  EKG today showed NSR with some low voltage, this is similar to a previous EKG.  We also checked for orthostasis which was normal.  I think this is atypical chest pain and not related to her heart. I discussed with her what to expect if she were to develop angina and return precautions.    Plan Restart Zantac 150mg  BID
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
## 2016-03-09 NOTE — Assessment & Plan Note (Signed)
A1C continues to be well controlled, 6.6 today.  Check twice yearly.    She is following with an eye doctor and is off drops for glaucoma.  She is due to go back for DR check this summer.

## 2016-03-10 ENCOUNTER — Telehealth: Payer: Self-pay | Admitting: *Deleted

## 2016-03-10 NOTE — Telephone Encounter (Signed)
Talked to pt - stated reflux med is not at the pharmacy. I called Ranitidine rx to [facility] pharmacy ; pt awared. Also stated she still has dizziness.

## 2016-03-10 NOTE — Telephone Encounter (Signed)
Tramadol rx called to [facility] pharmacy.

## 2016-03-10 NOTE — Telephone Encounter (Signed)
They are the same thing.   Can someone call the pharmacy and just tell them generic equivalent to Zantac is fine?   I am going to take over the Rx b/c ENT has declined.    Thanks!

## 2016-03-13 ENCOUNTER — Ambulatory Visit: Payer: Medicare HMO | Admitting: Physical Therapy

## 2016-03-13 DIAGNOSIS — R69 Illness, unspecified: Secondary | ICD-10-CM | POA: Diagnosis not present

## 2016-03-13 DIAGNOSIS — M25612 Stiffness of left shoulder, not elsewhere classified: Secondary | ICD-10-CM

## 2016-03-13 DIAGNOSIS — M6281 Muscle weakness (generalized): Secondary | ICD-10-CM

## 2016-03-13 DIAGNOSIS — G8929 Other chronic pain: Secondary | ICD-10-CM

## 2016-03-13 DIAGNOSIS — R293 Abnormal posture: Secondary | ICD-10-CM

## 2016-03-13 DIAGNOSIS — M25511 Pain in right shoulder: Secondary | ICD-10-CM | POA: Diagnosis not present

## 2016-03-13 DIAGNOSIS — M25611 Stiffness of right shoulder, not elsewhere classified: Secondary | ICD-10-CM

## 2016-03-13 NOTE — Patient Instructions (Signed)
Serratus band at doorway. 1x a day 10 X each,    Blue bands.
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
## 2016-03-14 ENCOUNTER — Ambulatory Visit: Payer: Medicare HMO | Admitting: Physical Therapy

## 2016-03-21 ENCOUNTER — Ambulatory Visit: Payer: Medicare HMO | Attending: Internal Medicine

## 2016-03-21 DIAGNOSIS — R293 Abnormal posture: Secondary | ICD-10-CM | POA: Diagnosis present

## 2016-03-21 DIAGNOSIS — M25511 Pain in right shoulder: Secondary | ICD-10-CM | POA: Insufficient documentation

## 2016-03-21 DIAGNOSIS — G8929 Other chronic pain: Secondary | ICD-10-CM | POA: Diagnosis present

## 2016-03-21 DIAGNOSIS — M25612 Stiffness of left shoulder, not elsewhere classified: Secondary | ICD-10-CM | POA: Diagnosis present

## 2016-03-21 DIAGNOSIS — M25611 Stiffness of right shoulder, not elsewhere classified: Secondary | ICD-10-CM

## 2016-03-21 DIAGNOSIS — M6281 Muscle weakness (generalized): Secondary | ICD-10-CM
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
## 2016-03-23 ENCOUNTER — Ambulatory Visit: Payer: Medicare HMO | Admitting: Physical Therapy

## 2016-03-23 ENCOUNTER — Encounter: Payer: Self-pay | Admitting: Physical Therapy

## 2016-03-23 DIAGNOSIS — M25511 Pain in right shoulder: Secondary | ICD-10-CM | POA: Diagnosis not present

## 2016-03-23 DIAGNOSIS — G8929 Other chronic pain: Secondary | ICD-10-CM

## 2016-03-23 DIAGNOSIS — R293 Abnormal posture: Secondary | ICD-10-CM

## 2016-03-23 DIAGNOSIS — M25612 Stiffness of left shoulder, not elsewhere classified: Secondary | ICD-10-CM

## 2016-03-23 DIAGNOSIS — M25611 Stiffness of right shoulder, not elsewhere classified: Secondary | ICD-10-CM

## 2016-03-23 DIAGNOSIS — M6281 Muscle weakness (generalized): Secondary | ICD-10-CM

## 2016-03-23 NOTE — Therapy (Addendum)
[place], [place], [address] Phone: [phone]   Fax:  [phone]  Physical Therapy Treatment/ Discharge  Patient Details  Name: Mariah Henderson MRN: [id number] Date of Birth: 24-Feb-1950 Referring Provider: Gilles Chiquito, MD  Encounter Date: 03/23/2016      PT End of Session - 03/23/16 1414    Visit Number 12   Number of Visits 12   Date for PT Re-Evaluation 03/24/16   PT Start Time 1330   PT Stop Time 1414   PT Time Calculation (min) 44 min   Behavior During Therapy [facility] for tasks assessed/performed      Past Medical History:  Diagnosis Date  . Anxiety   . Asthma   . Cervical spondylosis   . Chronic serous otitis media   . Constipation   . COPD (chronic obstructive pulmonary disease) ([place])   . Depression    followed by [name]; no longer of depakote, seroquel  . Diabetes mellitus    Diet controlled  . Foot drop   . GERD (gastroesophageal reflux disease)   . Heart murmur   . Hyperlipidemia   . Hypertension   . Low back pain   . Obesity   . Paraumbilical hernia     Past Surgical History:  Procedure Laterality Date  . ABDOMINAL HYSTERECTOMY    . ANTERIOR CERVICAL DECOMP/DISCECTOMY FUSION N/A 10/05/2014   Procedure: ACDF - C5-C6 - C6-C7;  Surgeon: Karie Chimera, MD;  Location: [place] [facility];  Service: Neurosurgery;  Laterality: N/A;  ACDF - C5-C6 - C6-C7  . CHOLECYSTECTOMY    . COLONOSCOPY    . FOOT SURGERY    . HERNIA REPAIR  1999  . RADIOACTIVE SEED GUIDED EXCISIONAL BREAST BIOPSY Left 06/23/2014   Procedure: RADIOACTIVE SEED GUIDED EXCISIONAL BREAST BIOPSY;  Surgeon: Stark Klein, MD;  Location: [place];  Service: General;  Laterality: Left;    There were no vitals filed for this visit.      Subjective Assessment - 03/23/16 1335    Subjective Today is my last day.  I would liked to do a little better.  Sleeping better.   Currently in Pain? Yes   Pain Score --   Mild.  No number   Pain Location Shoulder   Pain Orientation Right   Pain Descriptors / Indicators Aching   Pain Type Chronic pain   Pain Radiating Towards from medial clavicle to shoulder   Pain Frequency Intermittent   Aggravating Factors  reaching, sleeping on shoulder   Pain Relieving Factors tape,  exercise   Effect of Pain on Daily Activities Sometimes it wakes her up.,  limitsa a lot of reaching overhead.                         [facility] Adult PT Treatment/Exercise - 03/23/16 0001      Shoulder Exercises: Seated   Elevation Limitations elevation and depressoion 10 X   Retraction 10 reps   Retraction Limitations noted catch,  pulling  cues   External Rotation 10 reps   Theraband Level (Shoulder External Rotation) Level 1 (Yellow)   External Rotation Limitations 2 sets     Shoulder Exercises: Standing   External Rotation 10 reps  2 sets   Internal Rotation 10 reps  2 sets   Row 10 reps  2 sets   Row Limitations has yellow and blue bands for these     Shoulder Exercises: ROM/Strengthening  Diet-controlled type 2 diabetes mellitus ([place]) 05/26/2011  . Glaucoma 05/26/2011  . Osteoarthritis 06/30/2008  . Allergic rhinitis 05/01/2008  . HLD (hyperlipidemia) 12/06/2005  . Class 2 obesity with body mass index (BMI) of 35 to 39.9 without comorbidity 12/06/2005  . Depression, major, 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[place]) 12/06/2005  . Essential hypertension 12/06/2005    Navaya Wiatrek PTA 03/23/2016, 6:09 PM  [facility] [address] [place], [place], [address] Phone: [phone]   Fax:  [phone]  Name: Mariah Henderson MRN: [id number] Date of Birth: Mar 24, 1950  PHYSICAL THERAPY DISCHARGE SUMMARY  Visits from Start of Care: 12  Current functional level related to goals / functional outcomes: See above. She has progressed well and RT shoulder function better than LT . She still has pain but this should improve over time if she continues her HEP   Remaining deficits: See above   Education / Equipment: HEP Plan: Patient agrees to discharge.  Patient goals were partially met. Patient is being discharged due to                                                     ?????    Max benefit from PT at this time     Darrel Hoover PT 03/29/16  7:37 AM  Diet-controlled type 2 diabetes mellitus ([place]) 05/26/2011  . Glaucoma 05/26/2011  . Osteoarthritis 06/30/2008  . Allergic rhinitis 05/01/2008  . HLD (hyperlipidemia) 12/06/2005  . Class 2 obesity with body mass index (BMI) of 35 to 39.9 without comorbidity 12/06/2005  . Depression, major, 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[place]) 12/06/2005  . Essential hypertension 12/06/2005    Navaya Wiatrek PTA 03/23/2016, 6:09 PM  [facility] [address] [place], [place], [address] Phone: [phone]   Fax:  [phone]  Name: Mariah Henderson MRN: [id number] Date of Birth: Mar 24, 1950  PHYSICAL THERAPY DISCHARGE SUMMARY  Visits from Start of Care: 12  Current functional level related to goals / functional outcomes: See above. She has progressed well and RT shoulder function better than LT . She still has pain but this should improve over time if she continues her HEP   Remaining deficits: See above   Education / Equipment: HEP Plan: Patient agrees to discharge.  Patient goals were partially met. Patient is being discharged due to                                                     ?????    Max benefit from PT at this time     Darrel Hoover PT 03/29/16  7:37 AM

## 2016-03-28 ENCOUNTER — Ambulatory Visit: Payer: Medicare HMO

## 2016-03-30 ENCOUNTER — Ambulatory Visit: Payer: Medicare HMO | Admitting: Physical Therapy

## 2016-04-12 ENCOUNTER — Encounter: Payer: Medicare HMO | Admitting: Physical Therapy

## 2016-04-23 DIAGNOSIS — R69 Illness, unspecified: Secondary | ICD-10-CM | POA: Diagnosis not present

## 2016-04-25 DIAGNOSIS — M8589 Other specified disorders of bone density and structure, multiple sites: Secondary | ICD-10-CM | POA: Diagnosis not present

## 2016-04-25 DIAGNOSIS — Z1231 Encounter for screening mammogram for malignant neoplasm of breast: Secondary | ICD-10-CM | POA: Diagnosis not present

## 2016-04-25 DIAGNOSIS — Z803 Family history of malignant neoplasm of breast: Secondary | ICD-10-CM | POA: Diagnosis not present

## 2016-04-26 DIAGNOSIS — R69 Illness, unspecified: Secondary | ICD-10-CM | POA: Diagnosis not present

## 2016-04-28 ENCOUNTER — Emergency Department (HOSPITAL_COMMUNITY): Payer: Medicare HMO

## 2016-04-28 ENCOUNTER — Encounter (HOSPITAL_COMMUNITY): Payer: Self-pay | Admitting: Emergency Medicine

## 2016-04-28 ENCOUNTER — Emergency Department (HOSPITAL_COMMUNITY)
Admission: EM | Admit: 2016-04-28 | Discharge: 2016-04-28 | Disposition: A | Discharge status: Home / Self Care | Payer: Medicare HMO | Attending: Emergency Medicine | Admitting: Emergency Medicine

## 2016-04-28 ENCOUNTER — Emergency Department (HOSPITAL_BASED_OUTPATIENT_CLINIC_OR_DEPARTMENT_OTHER): Admit: 2016-04-28 | Discharge: 2016-04-28 | Disposition: A | Discharge status: Home / Self Care | Payer: Medicare HMO

## 2016-04-28 DIAGNOSIS — M79604 Pain in right leg: Secondary | ICD-10-CM

## 2016-04-28 DIAGNOSIS — Z79899 Other long term (current) drug therapy: Secondary | ICD-10-CM | POA: Insufficient documentation

## 2016-04-28 DIAGNOSIS — M7989 Other specified soft tissue disorders: Secondary | ICD-10-CM | POA: Diagnosis not present

## 2016-04-28 DIAGNOSIS — M25561 Pain in right knee: Secondary | ICD-10-CM | POA: Diagnosis not present

## 2016-04-28 DIAGNOSIS — E119 Type 2 diabetes mellitus without complications: Secondary | ICD-10-CM | POA: Diagnosis not present

## 2016-04-28 DIAGNOSIS — Z87891 Personal history of nicotine dependence: Secondary | ICD-10-CM | POA: Diagnosis not present

## 2016-04-28 DIAGNOSIS — J449 Chronic obstructive pulmonary disease, unspecified: Secondary | ICD-10-CM | POA: Insufficient documentation

## 2016-04-28 DIAGNOSIS — M79609 Pain in unspecified limb: Secondary | ICD-10-CM | POA: Diagnosis not present

## 2016-04-28 DIAGNOSIS — I10 Essential (primary) hypertension: Secondary | ICD-10-CM | POA: Insufficient documentation

## 2016-04-28 LAB — CBC WITH DIFFERENTIAL/PLATELET
Basophils Absolute: 0 10*3/uL (ref 0.0–0.1)
Basophils Relative: 0 %
Eosinophils Absolute: 0.4 10*3/uL (ref 0.0–0.7)
Eosinophils Relative: 5 %
HCT: 40.1 % (ref 36.0–46.0)
Hemoglobin: 12.9 g/dL (ref 12.0–15.0)
Lymphocytes Relative: 5 %
Lymphs Abs: 0.4 10*3/uL — ABNORMAL LOW (ref 0.7–4.0)
MCH: 28.4 pg (ref 26.0–34.0)
MCHC: 32.2 g/dL (ref 30.0–36.0)
MCV: 88.1 fL (ref 78.0–100.0)
Monocytes Absolute: 0.8 10*3/uL (ref 0.1–1.0)
Monocytes Relative: 10 %
Neutro Abs: 6.2 10*3/uL (ref 1.7–7.7)
Neutrophils Relative %: 80 %
Platelets: 288 10*3/uL (ref 150–400)
RBC: 4.55 MIL/uL (ref 3.87–5.11)
RDW: 13.6 % (ref 11.5–15.5)
WBC: 7.7 10*3/uL (ref 4.0–10.5)

## 2016-04-28 LAB — BASIC METABOLIC PANEL
Anion gap: 7 (ref 5–15)
BUN: 12 mg/dL (ref 6–20)
CO2: 29 mmol/L (ref 22–32)
Calcium: 9.1 mg/dL (ref 8.9–10.3)
Chloride: 104 mmol/L (ref 101–111)
Creatinine, Ser: 0.87 mg/dL (ref 0.44–1.00)
GFR calc Af Amer: >60 mL/min (ref >60)
GFR calc non Af Amer: >60 mL/min (ref >60)
Glucose, Bld: 189 mg/dL — ABNORMAL HIGH (ref 65–99)
Potassium: 3.6 mmol/L (ref 3.5–5.1)
Sodium: 140 mmol/L (ref 135–145)

## 2016-04-28 MED ORDER — MELOXICAM 7.5 MG PO TABS: 15.0000 mg | ORAL_TABLET | Freq: Every day | ORAL | 0 refills | Status: DC

## 2016-04-28 MED ORDER — HYDROCODONE-ACETAMINOPHEN 5-325 MG PO TABS
1.0000 | ORAL_TABLET | Freq: Once | ORAL | Status: AC
  Administered 2016-04-28: 1 via ORAL
  Filled 2016-04-28: qty 1
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
## 2016-04-28 NOTE — ED Triage Notes (Signed)
Pt complaint of right leg swelling, pain, and fever onset yesterday.

## 2016-04-28 NOTE — Discharge Instructions (Signed)
Take the prescribed medication as directed.  Can take this with your tramadol. Follow-up with orthopedics for ongoing issues.  Would also recommend to see your primary care doctor. Return to the ED for new or worsening symptoms.

## 2016-04-28 NOTE — ED Notes (Signed)
ED Provider at bedside. 

## 2016-04-28 NOTE — Progress Notes (Signed)
**  Preliminary report by tech**  Right lower extremity venous duplex complete. There is no evidence of deep or superficial vein thrombosis involving the right lower extremity. All visualized vessels appear patent and compressible. There is no evidence of a Baker's cyst on the right. Results were given to Quincy Carnes PA.  04/28/16 1:28 PM Mariah Henderson RVT

## 2016-04-28 NOTE — ED Notes (Signed)
US at bedside

## 2016-04-30 ENCOUNTER — Other Ambulatory Visit: Payer: Self-pay | Admitting: Internal Medicine

## 2016-05-01 ENCOUNTER — Telehealth: Payer: Self-pay | Admitting: Internal Medicine

## 2016-05-01 DIAGNOSIS — M79604 Pain in right leg: Secondary | ICD-10-CM

## 2016-05-01 NOTE — Telephone Encounter (Signed)
Please make her an appointment with me as well to further assess.  Referral placed.

## 2016-05-01 NOTE — Telephone Encounter (Signed)
Patient seen in ED (04/28/2016) requesting a referral to [facility] for her Right leg pain.

## 2016-05-02 ENCOUNTER — Other Ambulatory Visit: Payer: Self-pay | Admitting: Internal Medicine

## 2016-05-02 NOTE — Telephone Encounter (Signed)
Patient requesting a refill on her Benazepril.

## 2016-05-16 ENCOUNTER — Ambulatory Visit (INDEPENDENT_AMBULATORY_CARE_PROVIDER_SITE_OTHER): Payer: Medicare HMO | Admitting: Orthopaedic Surgery

## 2016-05-16 DIAGNOSIS — M25572 Pain in left ankle and joints of left foot: Secondary | ICD-10-CM | POA: Insufficient documentation

## 2016-05-16 DIAGNOSIS — M25561 Pain in right knee: Secondary | ICD-10-CM | POA: Diagnosis not present
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
## 2016-05-18 ENCOUNTER — Telehealth (INDEPENDENT_AMBULATORY_CARE_PROVIDER_SITE_OTHER): Payer: Self-pay | Admitting: Orthopaedic Surgery

## 2016-05-18 NOTE — Telephone Encounter (Signed)
Pt requested a call back about her knee brace, wants to come in to get another. She came in yesterday to get another but called back today and stated it doesn't fit still.  [phone]

## 2016-05-18 NOTE — Telephone Encounter (Signed)
LMOM to return call regarding brace

## 2016-05-18 NOTE — Telephone Encounter (Signed)
Pt will come in tomorrow at 12 so I can see how the braces fit she states they are fine when she leaves here but when she gets home and starts walking it slides down both hinged and knee sleeve brace.

## 2016-05-22 DIAGNOSIS — M7989 Other specified soft tissue disorders: Secondary | ICD-10-CM | POA: Diagnosis not present

## 2016-05-29 ENCOUNTER — Telehealth (INDEPENDENT_AMBULATORY_CARE_PROVIDER_SITE_OTHER): Payer: Self-pay | Admitting: Orthopaedic Surgery

## 2016-05-29 MED ORDER — DICLOFENAC SODIUM 75 MG PO TBEC: 75.0000 mg | DELAYED_RELEASE_TABLET | Freq: Two times a day (BID) | ORAL | 2 refills | Status: DC

## 2016-05-29 NOTE — Telephone Encounter (Signed)
Patient is wanting some medication for the inflammation. She is in a lot of pain. Had to go to urgent care last week because of the pain. CB # [phone]

## 2016-05-29 NOTE — Addendum Note (Signed)
Addended by: Azucena Cecil on: 05/29/2016 05:10 PM   Modules accepted: Orders

## 2016-05-29 NOTE — Telephone Encounter (Signed)
Please advise 

## 2016-05-29 NOTE — Telephone Encounter (Signed)
Patient called asked when will she be scheduled for her MRI at [facility]. The number to contact patient is [phone]

## 2016-05-29 NOTE — Telephone Encounter (Signed)
I sent in diclofenac

## 2016-05-30 NOTE — Telephone Encounter (Signed)
Called pt, she's aware.

## 2016-05-30 NOTE — Telephone Encounter (Signed)
Pt aware of appt scheduled for Friday May 18th at [facility]

## 2016-05-31 ENCOUNTER — Encounter: Payer: Self-pay | Admitting: Internal Medicine

## 2016-05-31 ENCOUNTER — Ambulatory Visit (INDEPENDENT_AMBULATORY_CARE_PROVIDER_SITE_OTHER): Payer: Medicare HMO | Admitting: Internal Medicine

## 2016-05-31 VITALS — BP 142/75 | HR 84 | Temp 98.4°F | Ht 60.0 in | Wt 185.7 lb

## 2016-05-31 DIAGNOSIS — R600 Localized edema: Secondary | ICD-10-CM

## 2016-05-31 DIAGNOSIS — M25461 Effusion, right knee: Secondary | ICD-10-CM | POA: Diagnosis not present

## 2016-05-31 DIAGNOSIS — Z87891 Personal history of nicotine dependence: Secondary | ICD-10-CM | POA: Diagnosis not present

## 2016-05-31 DIAGNOSIS — E119 Type 2 diabetes mellitus without complications: Secondary | ICD-10-CM | POA: Diagnosis not present

## 2016-05-31 DIAGNOSIS — I10 Essential (primary) hypertension: Secondary | ICD-10-CM | POA: Diagnosis not present

## 2016-05-31 DIAGNOSIS — M25561 Pain in right knee: Secondary | ICD-10-CM | POA: Diagnosis not present

## 2016-05-31 LAB — POCT GLYCOSYLATED HEMOGLOBIN (HGB A1C): Hemoglobin A1C: 6.3

## 2016-05-31 LAB — GLUCOSE, CAPILLARY: Glucose-Capillary: 105 mg/dL — ABNORMAL HIGH (ref 65–99)

## 2016-05-31 MED ORDER — ONDANSETRON HCL 4 MG PO TABS: 4.0000 mg | ORAL_TABLET | Freq: Three times a day (TID) | ORAL | 0 refills | Status: DC | PRN

## 2016-05-31 MED ORDER — HYDROCODONE-ACETAMINOPHEN 5-325 MG PO TABS
1.0000 | ORAL_TABLET | Freq: Four times a day (QID) | ORAL | 0 refills | Status: DC | PRN
Start: 2016-05-31 — End: 2016-06-09

## 2016-05-31 NOTE — Assessment & Plan Note (Signed)
A1C 6.3 today.  We did not discuss this at length.  She brought in her meter to review which looked good.  She has had no issues with hypoglycemia.

## 2016-05-31 NOTE — Progress Notes (Signed)
   Subjective:    [name], female    DOB: Apr 22, 1950, 66 y.o.   MRN: [id number]  CC: Knee pain  HPI   Mariah Henderson is a 66yo woman with PMH of HTN, DM2, reactive airway disease, GERD, HLD.  She has been seen a number of times in the past few weeks for right knee pain.  A doppler was done which was negative.  She had a knee xray which showed a possible loose body in the joint.  She has seen orthopedics who plan to do an MRI on Friday and then follow up with a plan after the imaging.  Mariah Henderson was very nervous about this whole process.  She has had to have surgeries on her arm and neck and is nervous to have another surgery.  She is worried about the swelling in her knee.  She was given a hinged knee brace and has trouble keeping it on.  The main issue is the pain, which is throbbing in nature, centered to the knee and worse when she is on it for a while.  She has continued to walk because she is concerned about getting a clot.  She received an Rx from Dr. Erlinda Hong yesterday for diclofenac, which she has taken twice but has not seen much improvement.  She does have occasional warmth of the knee, along with swelling, but no erythema.    A further symptom is nausea and stomach upset.  She feels this is due to the knee and her pain.  She has not had any worsening of her nausea with the diclofenac.  She is on ranitidine which has helped her reflux.   She is requesting a new BP cuff.      Review of Systems  Constitutional: Positive for activity change (due to knee pain). Negative for fatigue.  Respiratory: Negative for cough and shortness of breath.   Cardiovascular: Positive for leg swelling. Negative for chest pain.  Musculoskeletal: Positive for arthralgias, gait problem and joint swelling. Negative for neck pain and neck stiffness.  Neurological: Negative for dizziness, weakness and light-headedness.       Objective:   Physical Exam  Constitutional: She appears well-developed and  well-nourished.  HENT:  Head: Normocephalic and atraumatic.  Cardiovascular: Normal rate, regular rhythm and normal heart sounds.   No murmur heard. Pulmonary/Chest: Effort normal and breath sounds normal. No respiratory distress. She has no wheezes.  Musculoskeletal:  She has swelling of the right knee with effusion.  Some very mild warmth anteriorly, but not diffusely in the knee.  She has no erythema or skin changes.  She has 1+ pitting in the leg distal to the knee.  Her other leg has mild non pitting edema.  She has knee brace with her.    Skin: No rash noted. No erythema.  Psychiatric: She has a normal mood and affect. Her behavior is normal.        Assessment & Plan:  RTC in 1-2 months after orthopedic issues are resolved.

## 2016-05-31 NOTE — Assessment & Plan Note (Addendum)
This was the main issue we discussed today.  I reassured her that her orthopedic surgeon had a good plan.  The MRI will give a lot of details and inform the situation if she should need surgery.  She is having uncontrolled pain and the diclofenac, though an excellent anti inflammatory, is not helping much with pain.  In the interim, until she sees Dr. Erlinda Hong again, I will give her hydrocodone-acetaminophen 5/325 #12.  I advised her to only take at night and for severe pain during the day.  She does take tramadol at baseline and I advised her not to mix these medications.    She has a meloxicam Rx on her chart, this was from urgent care in April.    Follow up after MRI and surgical evaluation. Continue knee brace  Zofran for nausea, dyspepsia.  Continue ranitidine. QTc okay on last EKG in February.

## 2016-05-31 NOTE — Patient Instructions (Signed)
Mariah Henderson - -  For your knee pain, please try Hydrocodone-acetaminophen at night to see if this will help you sleep.  Please also take the Diclofenac Dr. Erlinda Hong ordered for you.    Please go on Friday to get your MRI, I will follow up the results as well as Dr. Erlinda Hong.    Thank you!  Please come back to see me in 1-2 months and let me know what the plan will be for your knee!  Acetaminophen; Hydrocodone tablets or capsules What is this medicine? ACETAMINOPHEN; HYDROCODONE (a set a MEE noe fen; hye droe KOE done) is a pain reliever. It is used to treat moderate to severe pain. This medicine may be used for other purposes; ask your health care provider or pharmacist if you have questions. COMMON BRAND NAME(S): Anexsia, Bancap HC, Ceta-Plus, Co-Gesic, Comfortpak, Dolagesic, [employer], DuoCet, Hydrocet, Hydrogesic, [place], Lorcet HD, Lorcet Plus, Lortab, Margesic H, Maxidone, [place], Polygesic, [place], [place], [place]in crew, Vicodin, Vicodin ES, Vicodin HP, Charlane Ferretti What should I tell my health care provider before I take this medicine? They need to know if you have any of these conditions: -brain tumor -Crohn's disease, inflammatory bowel disease, or ulcerative colitis -drug abuse or addiction -head injury -heart or circulation problems -if you often drink alcohol -kidney disease or problems going to the bathroom -liver disease -lung disease, asthma, or breathing problems -an unusual or allergic reaction to acetaminophen, hydrocodone, other opioid analgesics, other medicines, foods, dyes, or preservatives -pregnant or trying to get pregnant -breast-feeding How should I use this medicine? Take this medicine by mouth with a glass of water. Follow the directions on the prescription label. You can take it with or without food. If it upsets your stomach, take it with food. Do not take your medicine more often than directed. A special MedGuide will be given to you by the pharmacist with each  prescription and refill. Be sure to read this information carefully each time. Talk to your pediatrician regarding the use of this medicine in children. Special care may be needed. Overdosage: If you think you have taken too much of this medicine contact a poison control center or emergency room at once. NOTE: This medicine is only for you. Do not share this medicine with others. What if I miss a dose? If you miss a dose, take it as soon as you can. If it is almost time for your next dose, take only that dose. Do not take double or extra doses. What may interact with this medicine? This medicine may interact with the following medications: -alcohol -antiviral medicines for HIV or AIDS -atropine -antihistamines for allergy, cough and cold -certain antibiotics like erythromycin, clarithromycin -certain medicines for anxiety or sleep -certain medicines for bladder problems like oxybutynin, tolterodine -certain medicines for depression like amitriptyline, fluoxetine, sertraline -certain medicines for fungal infections like ketoconazole and itraconazole -certain medicines for Parkinson's disease like benztropine, trihexyphenidyl -certain medicines for seizures like carbamazepine, phenobarbital, phenytoin, primidone -certain medicines for stomach problems like dicyclomine, hyoscyamine -certain medicines for travel sickness like scopolamine -general anesthetics like halothane, isoflurane, methoxyflurane, propofol -ipratropium -local anesthetics like lidocaine, pramoxine, tetracaine -MAOIs like Carbex, Eldepryl, Marplan, Nardil, and Parnate -medicines that relax muscles for surgery -other medicines with acetaminophen -other narcotic medicines for pain or cough -phenothiazines like chlorpromazine, mesoridazine, prochlorperazine, thioridazine -rifampin This list may not describe all possible interactions. Give your health care provider a list of all the medicines, herbs, non-prescription drugs,  or dietary supplements you use. Also tell them  if you smoke, drink alcohol, or use illegal drugs. Some items may interact with your medicine. What should I watch for while using this medicine? Tell your doctor or health care professional if your pain does not go away, if it gets worse, or if you have new or a different type of pain. You may develop tolerance to the medicine. Tolerance means that you will need a higher dose of the medicine for pain relief. Tolerance is normal and is expected if you take the medicine for a long time. Do not suddenly stop taking your medicine because you may develop a severe reaction. Your body becomes used to the medicine. This does NOT mean you are addicted. Addiction is a behavior related to getting and using a drug for a non-medical reason. If you have pain, you have a medical reason to take pain medicine. Your doctor will tell you how much medicine to take. If your doctor wants you to stop the medicine, the dose will be slowly lowered over time to avoid any side effects. There are different types of narcotic medicines (opiates). If you take more than one type at the same time or if you are taking another medicine that also causes drowsiness, you may have more side effects. Give your health care provider a list of all medicines you use. Your doctor will tell you how much medicine to take. Do not take more medicine than directed. Call emergency for help if you have problems breathing or unusual sleepiness. Do not take other medicines that contain acetaminophen with this medicine. Always read labels carefully. If you have questions, ask your doctor or pharmacist. If you take too much acetaminophen get medical help right away. Too much acetaminophen can be very dangerous and cause liver damage. Even if you do not have symptoms, it is important to get help right away. You may get drowsy or dizzy. Do not drive, use machinery, or do anything that needs mental alertness until you  know how this medicine affects you. Do not stand or sit up quickly, especially if you are an older patient. This reduces the risk of dizzy or fainting spells. Alcohol may interfere with the effect of this medicine. Avoid alcoholic drinks. The medicine will cause constipation. Try to have a bowel movement at least every 2 to 3 days. If you do not have a bowel movement for 3 days, call your doctor or health care professional. Your mouth may get dry. Chewing sugarless gum or sucking hard candy, and drinking plenty of water may help. Contact your doctor if the problem does not go away or is severe. What side effects may I notice from receiving this medicine? Side effects that you should report to your doctor or health care professional as soon as possible: -allergic reactions like skin rash, itching or hives, swelling of the face, lips, or tongue -breathing problems -confusion -redness, blistering, peeling or loosening of the skin, including inside the mouth -signs and symptoms of low blood pressure like dizziness; feeling faint or lightheaded, falls; unusually weak or tired -trouble passing urine or change in the amount of urine -yellowing of the eyes or skin Side effects that usually do not require medical attention (report to your doctor or health care professional if they continue or are bothersome): -constipation -dry mouth -nausea, vomiting -tiredness This list may not describe all possible side effects. Call your doctor for medical advice about side effects. You may report side effects to FDA at 1-800-FDA-1088. Where should I keep my medicine?  Keep out of the reach of children. This medicine can be abused. Keep your medicine in a safe place to protect it from theft. Do not share this medicine with anyone. Selling or giving away this medicine is dangerous and against the law. This medicine may cause accidental overdose and death if it taken by other adults, children, or pets. Mix any unused  medicine with a substance like cat litter or coffee grounds. Then throw the medicine away in a sealed container like a sealed bag or a coffee can with a lid. Do not use the medicine after the expiration date. Store at room temperature between 15 and 30 degrees C (59 and 86 degrees F). NOTE: This sheet is a summary. It may not cover all possible information. If you have questions about this medicine, talk to your doctor, pharmacist, or health care provider.  2018 Elsevier/Gold Standard (2014-09-25 10:02:16)

## 2016-05-31 NOTE — Assessment & Plan Note (Signed)
We discussed this issue at length.  Her BP was mildly elevated into the 140s today and she was concerned about this.  She is taking her BP medications.  She notes that she knows it is likely higher due to pain.  I would not opt to change medications at this time given she has uncontrolled pain and is about to undergo an orthopedic procedure, likely.    Plan Continue lasix and benazapril.

## 2016-06-01 ENCOUNTER — Telehealth (INDEPENDENT_AMBULATORY_CARE_PROVIDER_SITE_OTHER): Payer: Self-pay | Admitting: Orthopaedic Surgery

## 2016-06-01 NOTE — Telephone Encounter (Signed)
Please advise 

## 2016-06-01 NOTE — Telephone Encounter (Signed)
Patient called in severe pain in her right leg. She was wondering if there was any type of medication that she could take or possibly some ointment that she could rub on her leg to help with the pain. CB # [phone]

## 2016-06-01 NOTE — Telephone Encounter (Signed)
Pennsaid 2%. Apply 2 oz bid prn pain.

## 2016-06-02 ENCOUNTER — Telehealth (INDEPENDENT_AMBULATORY_CARE_PROVIDER_SITE_OTHER): Payer: Self-pay | Admitting: Orthopaedic Surgery

## 2016-06-02 ENCOUNTER — Encounter (INDEPENDENT_AMBULATORY_CARE_PROVIDER_SITE_OTHER): Payer: Self-pay

## 2016-06-02 ENCOUNTER — Ambulatory Visit (HOSPITAL_COMMUNITY)
Admission: RE | Admit: 2016-06-02 | Discharge: 2016-06-02 | Disposition: A | Discharge status: Home / Self Care | Payer: Medicare HMO | Source: Ambulatory Visit | Attending: Orthopaedic Surgery | Admitting: Orthopaedic Surgery

## 2016-06-02 ENCOUNTER — Ambulatory Visit (INDEPENDENT_AMBULATORY_CARE_PROVIDER_SITE_OTHER): Payer: Medicare HMO | Admitting: Orthopaedic Surgery

## 2016-06-02 DIAGNOSIS — M7121 Synovial cyst of popliteal space [Baker], right knee: Secondary | ICD-10-CM | POA: Insufficient documentation

## 2016-06-02 DIAGNOSIS — M659 Synovitis and tenosynovitis, unspecified: Secondary | ICD-10-CM | POA: Insufficient documentation

## 2016-06-02 DIAGNOSIS — M25561 Pain in right knee: Secondary | ICD-10-CM | POA: Diagnosis not present

## 2016-06-02 DIAGNOSIS — X58XXXA Exposure to other specified factors, initial encounter: Secondary | ICD-10-CM | POA: Insufficient documentation

## 2016-06-02 DIAGNOSIS — M84361A Stress fracture, right tibia, initial encounter for fracture: Secondary | ICD-10-CM | POA: Diagnosis not present

## 2016-06-02 DIAGNOSIS — M25461 Effusion, right knee: Secondary | ICD-10-CM | POA: Insufficient documentation

## 2016-06-02 DIAGNOSIS — S83241A Other tear of medial meniscus, current injury, right knee, initial encounter: Secondary | ICD-10-CM | POA: Diagnosis not present

## 2016-06-02 MED ORDER — DICLOFENAC SODIUM 2 % TD SOLN: TRANSDERMAL | 0 refills | Status: DC

## 2016-06-02 NOTE — Telephone Encounter (Signed)
Patient said she would like for the pennsaid 2% to be sent to the [facility] ([facility] on [address]. CB# [phone]

## 2016-06-02 NOTE — Addendum Note (Signed)
Addended by: Precious Bard on: 06/02/2016 09:44 AM   Modules accepted: Orders

## 2016-06-04 DIAGNOSIS — R69 Illness, unspecified: Secondary | ICD-10-CM | POA: Diagnosis not present

## 2016-06-05 ENCOUNTER — Telehealth (INDEPENDENT_AMBULATORY_CARE_PROVIDER_SITE_OTHER): Payer: Self-pay | Admitting: Orthopaedic Surgery

## 2016-06-05 NOTE — Telephone Encounter (Signed)
PA DONE THROUGH COVER MY MEDS. WILL CHECK BACK

## 2016-06-05 NOTE — Telephone Encounter (Signed)
PATIENT WILL COME IN TOMORROW.

## 2016-06-05 NOTE — Telephone Encounter (Signed)
Patient called needing a pre authorization on her pansaid cream. CB # [phone]

## 2016-06-06 ENCOUNTER — Ambulatory Visit (INDEPENDENT_AMBULATORY_CARE_PROVIDER_SITE_OTHER): Payer: Medicare HMO | Admitting: Orthopaedic Surgery

## 2016-06-06 DIAGNOSIS — M84361A Stress fracture, right tibia, initial encounter for fracture: Secondary | ICD-10-CM | POA: Diagnosis not present

## 2016-06-06 DIAGNOSIS — M23321 Other meniscus derangements, posterior horn of medial meniscus, right knee: Secondary | ICD-10-CM

## 2016-06-06 DIAGNOSIS — J449 Chronic obstructive pulmonary disease, unspecified: Secondary | ICD-10-CM | POA: Diagnosis not present

## 2016-06-06 DIAGNOSIS — M545 Low back pain: Secondary | ICD-10-CM | POA: Diagnosis not present

## 2016-06-06 DIAGNOSIS — S82135A Nondisplaced fracture of medial condyle of left tibia, initial encounter for closed fracture: Secondary | ICD-10-CM | POA: Insufficient documentation

## 2016-06-06 DIAGNOSIS — J45909 Unspecified asthma, uncomplicated: Secondary | ICD-10-CM | POA: Diagnosis not present

## 2016-06-06 MED ORDER — ACETAMINOPHEN-CODEINE #3 300-30 MG PO TABS: 1.0000 | ORAL_TABLET | Freq: Three times a day (TID) | ORAL | 0 refills | Status: DC | PRN

## 2016-06-06 MED ORDER — DICLOFENAC SODIUM 1 % TD GEL: 2.0000 g | Freq: Four times a day (QID) | TRANSDERMAL | 5 refills | Status: DC

## 2016-06-06 MED ORDER — HYDROCODONE-ACETAMINOPHEN 5-325 MG PO TABS: 1.0000 | ORAL_TABLET | Freq: Four times a day (QID) | ORAL | 0 refills | Status: DC | PRN
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
## 2016-06-07 ENCOUNTER — Telehealth (INDEPENDENT_AMBULATORY_CARE_PROVIDER_SITE_OTHER): Payer: Self-pay | Admitting: Orthopaedic Surgery

## 2016-06-07 NOTE — Telephone Encounter (Signed)
Just an FYI
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
## 2016-06-08 ENCOUNTER — Other Ambulatory Visit (INDEPENDENT_AMBULATORY_CARE_PROVIDER_SITE_OTHER): Payer: Self-pay | Admitting: Orthopaedic Surgery

## 2016-06-08 DIAGNOSIS — S83241D Other tear of medial meniscus, current injury, right knee, subsequent encounter: Secondary | ICD-10-CM

## 2016-06-09 ENCOUNTER — Encounter (HOSPITAL_BASED_OUTPATIENT_CLINIC_OR_DEPARTMENT_OTHER): Payer: Self-pay | Admitting: *Deleted

## 2016-06-13 ENCOUNTER — Encounter (HOSPITAL_BASED_OUTPATIENT_CLINIC_OR_DEPARTMENT_OTHER)
Admission: RE | Admit: 2016-06-13 | Discharge: 2016-06-13 | Disposition: A | Discharge status: Home / Self Care | Payer: Medicare HMO | Source: Ambulatory Visit | Attending: Orthopaedic Surgery | Admitting: Orthopaedic Surgery

## 2016-06-13 DIAGNOSIS — X58XXXA Exposure to other specified factors, initial encounter: Secondary | ICD-10-CM | POA: Diagnosis not present

## 2016-06-13 DIAGNOSIS — K219 Gastro-esophageal reflux disease without esophagitis: Secondary | ICD-10-CM | POA: Diagnosis not present

## 2016-06-13 DIAGNOSIS — J449 Chronic obstructive pulmonary disease, unspecified: Secondary | ICD-10-CM | POA: Diagnosis not present

## 2016-06-13 DIAGNOSIS — S83241A Other tear of medial meniscus, current injury, right knee, initial encounter: Secondary | ICD-10-CM | POA: Diagnosis not present

## 2016-06-13 DIAGNOSIS — E785 Hyperlipidemia, unspecified: Secondary | ICD-10-CM | POA: Diagnosis not present

## 2016-06-13 DIAGNOSIS — I10 Essential (primary) hypertension: Secondary | ICD-10-CM | POA: Diagnosis not present

## 2016-06-13 DIAGNOSIS — M94261 Chondromalacia, right knee: Secondary | ICD-10-CM | POA: Diagnosis not present

## 2016-06-13 DIAGNOSIS — F329 Major depressive disorder, single episode, unspecified: Secondary | ICD-10-CM | POA: Diagnosis not present

## 2016-06-13 DIAGNOSIS — Z79899 Other long term (current) drug therapy: Secondary | ICD-10-CM | POA: Diagnosis not present

## 2016-06-13 DIAGNOSIS — F419 Anxiety disorder, unspecified: Secondary | ICD-10-CM | POA: Diagnosis not present

## 2016-06-13 DIAGNOSIS — Y939 Activity, unspecified: Secondary | ICD-10-CM | POA: Diagnosis not present

## 2016-06-13 DIAGNOSIS — Y929 Unspecified place or not applicable: Secondary | ICD-10-CM | POA: Diagnosis not present

## 2016-06-13 DIAGNOSIS — E669 Obesity, unspecified: Secondary | ICD-10-CM | POA: Diagnosis not present

## 2016-06-13 DIAGNOSIS — M65861 Other synovitis and tenosynovitis, right lower leg: Secondary | ICD-10-CM | POA: Diagnosis not present

## 2016-06-13 DIAGNOSIS — S83281A Other tear of lateral meniscus, current injury, right knee, initial encounter: Secondary | ICD-10-CM | POA: Diagnosis not present

## 2016-06-13 DIAGNOSIS — Z87891 Personal history of nicotine dependence: Secondary | ICD-10-CM | POA: Diagnosis not present

## 2016-06-13 DIAGNOSIS — E119 Type 2 diabetes mellitus without complications: Secondary | ICD-10-CM | POA: Diagnosis not present

## 2016-06-13 DIAGNOSIS — S82144A Nondisplaced bicondylar fracture of right tibia, initial encounter for closed fracture: Secondary | ICD-10-CM | POA: Diagnosis not present

## 2016-06-13 DIAGNOSIS — Z6836 Body mass index (BMI) 36.0-36.9, adult: Secondary | ICD-10-CM | POA: Diagnosis not present

## 2016-06-13 LAB — BASIC METABOLIC PANEL
Anion gap: 9 (ref 5–15)
BUN: 9 mg/dL (ref 6–20)
CO2: 27 mmol/L (ref 22–32)
Calcium: 9.1 mg/dL (ref 8.9–10.3)
Chloride: 104 mmol/L (ref 101–111)
Creatinine, Ser: 0.82 mg/dL (ref 0.44–1.00)
GFR calc Af Amer: >60 mL/min (ref >60)
GFR calc non Af Amer: >60 mL/min (ref >60)
Glucose, Bld: 140 mg/dL — ABNORMAL HIGH (ref 65–99)
Potassium: 3.7 mmol/L (ref 3.5–5.1)
Sodium: 140 mmol/L (ref 135–145)

## 2016-06-14 ENCOUNTER — Ambulatory Visit (HOSPITAL_BASED_OUTPATIENT_CLINIC_OR_DEPARTMENT_OTHER)
Admission: RE | Admit: 2016-06-14 | Discharge: 2016-06-14 | Disposition: A | Discharge status: Home / Self Care | Payer: Medicare HMO | Source: Ambulatory Visit | Attending: Orthopaedic Surgery | Admitting: Orthopaedic Surgery

## 2016-06-14 ENCOUNTER — Other Ambulatory Visit (HOSPITAL_COMMUNITY): Payer: Medicare HMO

## 2016-06-14 ENCOUNTER — Encounter (HOSPITAL_BASED_OUTPATIENT_CLINIC_OR_DEPARTMENT_OTHER)
Admission: RE | Disposition: A | Discharge status: Home / Self Care | Payer: Self-pay | Source: Ambulatory Visit | Attending: Orthopaedic Surgery

## 2016-06-14 ENCOUNTER — Encounter (HOSPITAL_BASED_OUTPATIENT_CLINIC_OR_DEPARTMENT_OTHER): Payer: Self-pay | Admitting: *Deleted

## 2016-06-14 ENCOUNTER — Ambulatory Visit (HOSPITAL_BASED_OUTPATIENT_CLINIC_OR_DEPARTMENT_OTHER): Payer: Medicare HMO | Admitting: Anesthesiology

## 2016-06-14 ENCOUNTER — Ambulatory Visit (HOSPITAL_COMMUNITY): Payer: Medicare HMO

## 2016-06-14 DIAGNOSIS — J449 Chronic obstructive pulmonary disease, unspecified: Secondary | ICD-10-CM | POA: Insufficient documentation

## 2016-06-14 DIAGNOSIS — M1711 Unilateral primary osteoarthritis, right knee: Secondary | ICD-10-CM | POA: Diagnosis not present

## 2016-06-14 DIAGNOSIS — M94261 Chondromalacia, right knee: Secondary | ICD-10-CM | POA: Diagnosis not present

## 2016-06-14 DIAGNOSIS — E119 Type 2 diabetes mellitus without complications: Secondary | ICD-10-CM | POA: Insufficient documentation

## 2016-06-14 DIAGNOSIS — S83241A Other tear of medial meniscus, current injury, right knee, initial encounter: Secondary | ICD-10-CM | POA: Diagnosis not present

## 2016-06-14 DIAGNOSIS — M65861 Other synovitis and tenosynovitis, right lower leg: Secondary | ICD-10-CM | POA: Insufficient documentation

## 2016-06-14 DIAGNOSIS — Z6836 Body mass index (BMI) 36.0-36.9, adult: Secondary | ICD-10-CM | POA: Insufficient documentation

## 2016-06-14 DIAGNOSIS — E669 Obesity, unspecified: Secondary | ICD-10-CM | POA: Insufficient documentation

## 2016-06-14 DIAGNOSIS — S83281A Other tear of lateral meniscus, current injury, right knee, initial encounter: Secondary | ICD-10-CM | POA: Insufficient documentation

## 2016-06-14 DIAGNOSIS — F419 Anxiety disorder, unspecified: Secondary | ICD-10-CM | POA: Insufficient documentation

## 2016-06-14 DIAGNOSIS — S82144A Nondisplaced bicondylar fracture of right tibia, initial encounter for closed fracture: Secondary | ICD-10-CM | POA: Diagnosis not present

## 2016-06-14 DIAGNOSIS — X58XXXA Exposure to other specified factors, initial encounter: Secondary | ICD-10-CM | POA: Diagnosis not present

## 2016-06-14 DIAGNOSIS — Z4789 Encounter for other orthopedic aftercare: Secondary | ICD-10-CM

## 2016-06-14 DIAGNOSIS — K219 Gastro-esophageal reflux disease without esophagitis: Secondary | ICD-10-CM | POA: Diagnosis not present

## 2016-06-14 DIAGNOSIS — M7989 Other specified soft tissue disorders: Secondary | ICD-10-CM | POA: Diagnosis not present

## 2016-06-14 DIAGNOSIS — F329 Major depressive disorder, single episode, unspecified: Secondary | ICD-10-CM | POA: Insufficient documentation

## 2016-06-14 DIAGNOSIS — S83241D Other tear of medial meniscus, current injury, right knee, subsequent encounter: Secondary | ICD-10-CM

## 2016-06-14 DIAGNOSIS — Z79899 Other long term (current) drug therapy: Secondary | ICD-10-CM | POA: Insufficient documentation

## 2016-06-14 DIAGNOSIS — Z87891 Personal history of nicotine dependence: Secondary | ICD-10-CM | POA: Insufficient documentation

## 2016-06-14 DIAGNOSIS — E785 Hyperlipidemia, unspecified: Secondary | ICD-10-CM | POA: Diagnosis not present

## 2016-06-14 DIAGNOSIS — Y939 Activity, unspecified: Secondary | ICD-10-CM | POA: Insufficient documentation

## 2016-06-14 DIAGNOSIS — Y929 Unspecified place or not applicable: Secondary | ICD-10-CM | POA: Insufficient documentation

## 2016-06-14 DIAGNOSIS — I10 Essential (primary) hypertension: Secondary | ICD-10-CM | POA: Diagnosis not present

## 2016-06-14 DIAGNOSIS — S82135D Nondisplaced fracture of medial condyle of left tibia, subsequent encounter for closed fracture with routine healing: Secondary | ICD-10-CM | POA: Diagnosis not present

## 2016-06-14 HISTORY — PX: KNEE ARTHROSCOPY WITH SUBCHONDROPLASTY: SHX6732

## 2016-06-14 HISTORY — DX: Other tear of medial meniscus, current injury, unspecified knee, initial encounter: S83.249A

## 2016-06-14 HISTORY — DX: Prediabetes: R73.03

## 2016-06-14 HISTORY — PX: KNEE ARTHROSCOPY WITH MEDIAL MENISECTOMY: SHX5651

## 2016-06-14 SURGERY — ARTHROSCOPY, KNEE, WITH MEDIAL MENISCECTOMY
Anesthesia: General | Site: Knee | Laterality: Right

## 2016-06-14 MED ORDER — FENTANYL CITRATE (PF) 100 MCG/2ML IJ SOLN
INTRAMUSCULAR | Status: AC
  Filled 2016-06-14: qty 2

## 2016-06-14 MED ORDER — BUPIVACAINE HCL (PF) 0.25 % IJ SOLN
INTRAMUSCULAR | Status: AC
  Filled 2016-06-14: qty 30

## 2016-06-14 MED ORDER — LIDOCAINE HCL (CARDIAC) 20 MG/ML IV SOLN
INTRAVENOUS | Status: DC | PRN
  Administered 2016-06-14: 100 mg via INTRAVENOUS

## 2016-06-14 MED ORDER — OXYCODONE-ACETAMINOPHEN 5-325 MG PO TABS: 1.0000 | ORAL_TABLET | ORAL | 0 refills | Status: DC | PRN

## 2016-06-14 MED ORDER — SODIUM CHLORIDE 0.9 % IR SOLN
Status: DC | PRN
  Administered 2016-06-14: 3000 mL

## 2016-06-14 MED ORDER — SCOPOLAMINE 1 MG/3DAYS TD PT72: 1.0000 | MEDICATED_PATCH | Freq: Once | TRANSDERMAL | Status: DC | PRN

## 2016-06-14 MED ORDER — OXYCODONE HCL 5 MG PO TABS
5.0000 mg | ORAL_TABLET | Freq: Once | ORAL | Status: AC
  Administered 2016-06-14: 5 mg via ORAL

## 2016-06-14 MED ORDER — ONDANSETRON HCL 4 MG PO TABS: 4.0000 mg | ORAL_TABLET | Freq: Three times a day (TID) | ORAL | 0 refills | Status: DC | PRN

## 2016-06-14 MED ORDER — OXYCODONE HCL 5 MG PO TABS
ORAL_TABLET | ORAL | Status: AC
  Filled 2016-06-14: qty 1

## 2016-06-14 MED ORDER — CELECOXIB 200 MG PO CAPS
200.0000 mg | ORAL_CAPSULE | Freq: Once | ORAL | Status: AC
  Administered 2016-06-14: 200 mg via ORAL

## 2016-06-14 MED ORDER — ONDANSETRON HCL 4 MG/2ML IJ SOLN: 4.0000 mg | Freq: Once | INTRAMUSCULAR | Status: DC | PRN

## 2016-06-14 MED ORDER — CELECOXIB 200 MG PO CAPS
ORAL_CAPSULE | ORAL | Status: AC
  Filled 2016-06-14: qty 1

## 2016-06-14 MED ORDER — HYDROMORPHONE HCL 1 MG/ML IJ SOLN
INTRAMUSCULAR | Status: AC
  Filled 2016-06-14: qty 0.5

## 2016-06-14 MED ORDER — LABETALOL HCL 5 MG/ML IV SOLN
10.0000 mg | Freq: Once | INTRAVENOUS | Status: AC
  Administered 2016-06-14: 10 mg via INTRAVENOUS

## 2016-06-14 MED ORDER — DEXAMETHASONE SODIUM PHOSPHATE 4 MG/ML IJ SOLN
INTRAMUSCULAR | Status: DC | PRN
  Administered 2016-06-14: 10 mg via INTRAVENOUS

## 2016-06-14 MED ORDER — MIDAZOLAM HCL 2 MG/2ML IJ SOLN
1.0000 mg | INTRAMUSCULAR | Status: DC | PRN
  Administered 2016-06-14 (×2): 1 mg via INTRAVENOUS

## 2016-06-14 MED ORDER — LABETALOL HCL 5 MG/ML IV SOLN
INTRAVENOUS | Status: AC
  Filled 2016-06-14: qty 4

## 2016-06-14 MED ORDER — FENTANYL CITRATE (PF) 100 MCG/2ML IJ SOLN
50.0000 ug | INTRAMUSCULAR | Status: AC | PRN
  Administered 2016-06-14 (×4): 50 ug via INTRAVENOUS

## 2016-06-14 MED ORDER — HYDROMORPHONE HCL 1 MG/ML IJ SOLN: 0.2500 mg | INTRAMUSCULAR | Status: DC | PRN

## 2016-06-14 MED ORDER — DEXAMETHASONE SODIUM PHOSPHATE 10 MG/ML IJ SOLN
INTRAMUSCULAR | Status: AC
  Filled 2016-06-14: qty 1

## 2016-06-14 MED ORDER — ONDANSETRON HCL 4 MG/2ML IJ SOLN
INTRAMUSCULAR | Status: AC
  Filled 2016-06-14: qty 2

## 2016-06-14 MED ORDER — PROPOFOL 10 MG/ML IV BOLUS
INTRAVENOUS | Status: DC | PRN
  Administered 2016-06-14: 150 mg via INTRAVENOUS

## 2016-06-14 MED ORDER — MIDAZOLAM HCL 2 MG/2ML IJ SOLN
INTRAMUSCULAR | Status: AC
  Filled 2016-06-14: qty 2

## 2016-06-14 MED ORDER — CEFAZOLIN SODIUM-DEXTROSE 2-4 GM/100ML-% IV SOLN
INTRAVENOUS | Status: AC
  Filled 2016-06-14: qty 100

## 2016-06-14 MED ORDER — HYDROMORPHONE HCL 1 MG/ML IJ SOLN: 0.5000 mg | INTRAMUSCULAR | Status: DC | PRN

## 2016-06-14 MED ORDER — CEFAZOLIN SODIUM-DEXTROSE 2-4 GM/100ML-% IV SOLN: 2.0000 g | INTRAVENOUS | Status: DC

## 2016-06-14 MED ORDER — FENTANYL CITRATE (PF) 100 MCG/2ML IJ SOLN
INTRAMUSCULAR | Status: AC
Start: 2016-06-14 — End: 2016-06-14
  Filled 2016-06-14: qty 2

## 2016-06-14 MED ORDER — ACETAMINOPHEN 500 MG PO TABS
ORAL_TABLET | ORAL | Status: AC
  Filled 2016-06-14: qty 2

## 2016-06-14 MED ORDER — ACETAMINOPHEN 500 MG PO TABS
1000.0000 mg | ORAL_TABLET | Freq: Once | ORAL | Status: AC
  Administered 2016-06-14: 1000 mg via ORAL

## 2016-06-14 MED ORDER — FENTANYL CITRATE (PF) 100 MCG/2ML IJ SOLN
25.0000 ug | INTRAMUSCULAR | Status: DC | PRN
  Administered 2016-06-14 (×3): 50 ug via INTRAVENOUS

## 2016-06-14 MED ORDER — ONDANSETRON HCL 4 MG/2ML IJ SOLN
INTRAMUSCULAR | Status: DC | PRN
  Administered 2016-06-14: 4 mg via INTRAVENOUS

## 2016-06-14 MED ORDER — LACTATED RINGERS IV SOLN
INTRAVENOUS | Status: DC
  Administered 2016-06-14: 10:00:00 via INTRAVENOUS
  Administered 2016-06-14: 10 mL/h via INTRAVENOUS

## 2016-06-14 SURGICAL SUPPLY — 42 items
ACCUFILL 5CC ×2 IMPLANT
ACCUPORT CANNULA 11GAX120MM ×2 IMPLANT
BANDAGE ACE 6X5 VEL STRL LF (GAUZE/BANDAGES/DRESSINGS) ×6 IMPLANT
BANDAGE ESMARK 6X9 LF (GAUZE/BANDAGES/DRESSINGS) IMPLANT
BLADE 4.2CUDA (BLADE) ×3 IMPLANT
BLADE CUDA GRT WHITE 3.5 (BLADE) IMPLANT
BLADE CUDA SHAVER 3.5 (BLADE) IMPLANT
BLADE CUTTER GATOR 3.5 (BLADE) IMPLANT
BNDG CMPR 9X6 STRL LF SNTH (GAUZE/BANDAGES/DRESSINGS)
BNDG ESMARK 6X9 LF (GAUZE/BANDAGES/DRESSINGS)
CUFF TOURNIQUET SINGLE 34IN LL (TOURNIQUET CUFF) ×3 IMPLANT
DRAPE ARTHROSCOPY W/POUCH 90 (DRAPES) ×3 IMPLANT
DRAPE IMP U-DRAPE 54X76 (DRAPES) IMPLANT
DRAPE U-SHAPE 47X51 STRL (DRAPES) ×3 IMPLANT
DURAPREP 26ML APPLICATOR (WOUND CARE) ×3 IMPLANT
ELECT MENISCUS 165MM 90D (ELECTRODE) IMPLANT
ELECT REM PT RETURN 9FT ADLT (ELECTROSURGICAL)
ELECTRODE REM PT RTRN 9FT ADLT (ELECTROSURGICAL) IMPLANT
GAUZE SPONGE 4X4 12PLY STRL (GAUZE/BANDAGES/DRESSINGS) ×3 IMPLANT
GAUZE XEROFORM 1X8 LF (GAUZE/BANDAGES/DRESSINGS) ×3 IMPLANT
GLOVE SKINSENSE NS SZ7.5 (GLOVE) ×2
GLOVE SKINSENSE STRL SZ7.5 (GLOVE) ×1 IMPLANT
GLOVE SURG SYN 7.5  E (GLOVE) ×2
GLOVE SURG SYN 7.5 E (GLOVE) ×1 IMPLANT
GLOVE SURG SYN 7.5 PF PI (GLOVE) ×1 IMPLANT
GOWN STRL REIN XL XLG (GOWN DISPOSABLE) ×3 IMPLANT
GOWN STRL REUS W/ TWL LRG LVL3 (GOWN DISPOSABLE) ×1 IMPLANT
GOWN STRL REUS W/TWL LRG LVL3 (GOWN DISPOSABLE) ×3
KIT MIXER ACCUMIX (KITS) ×2 IMPLANT
KNEE WRAP E Z 3 GEL PACK (MISCELLANEOUS) ×3 IMPLANT
MANIFOLD NEPTUNE II (INSTRUMENTS) ×3 IMPLANT
PACK ARTHROSCOPY DSU (CUSTOM PROCEDURE TRAY) ×3 IMPLANT
PACK BASIN DAY SURGERY FS (CUSTOM PROCEDURE TRAY) ×3 IMPLANT
PENCIL BUTTON HOLSTER BLD 10FT (ELECTRODE) IMPLANT
RESECTOR FULL RADIUS 4.2MM (BLADE) IMPLANT
SET ARTHROSCOPY TUBING (MISCELLANEOUS) ×3
SET ARTHROSCOPY TUBING LN (MISCELLANEOUS) ×1 IMPLANT
SUT ETHILON 3 0 PS 1 (SUTURE) ×3 IMPLANT
TOWEL OR 17X24 6PK STRL BLUE (TOWEL DISPOSABLE) ×3 IMPLANT
TOWEL OR NON WOVEN STRL DISP B (DISPOSABLE) IMPLANT
WATER STERILE IRR 1000ML POUR (IV SOLUTION) ×3 IMPLANT
subchondroplasty knee kit (Cement) ×2 IMPLANT

## 2016-06-14 NOTE — Anesthesia Procedure Notes (Signed)
Procedure Name: LMA Insertion Date/Time: 06/14/2016 8:52 AM Performed by: Maryella Shivers Pre-anesthesia Checklist: Patient identified, Emergency Drugs available, Suction available and Patient being monitored Patient Re-evaluated:Patient Re-evaluated prior to inductionOxygen Delivery Method: Circle system utilized Preoxygenation: Pre-oxygenation with 100% oxygen Intubation Type: IV induction Ventilation: Mask ventilation without difficulty LMA: LMA inserted LMA Size: 4.0 Number of attempts: 1 Airway Equipment and Method: Bite block Placement Confirmation: positive ETCO2 Tube secured with: Tape Dental Injury: Teeth and Oropharynx as per pre-operative assessment

## 2016-06-14 NOTE — Anesthesia Postprocedure Evaluation (Signed)
Anesthesia Post Note  Patient: Breyona Swander  Procedure(s) Performed: Procedure(s) (LRB): RIGHT KNEE ARTHROSCOPY WITH PARTIAL MEDIAL MENISCECTOMY, SUBCHONDROPLASTY (Right) KNEE ARTHROSCOPY WITH SUBCHONDROPLASTY (Right)  Patient location during evaluation: PACU Anesthesia Type: General Level of consciousness: awake and alert Pain management: pain level controlled Vital Signs Assessment: post-procedure vital signs reviewed and stable Respiratory status: spontaneous breathing, nonlabored ventilation, respiratory function stable and patient connected to nasal cannula oxygen Cardiovascular status: blood pressure returned to baseline and stable Postop Assessment: no signs of nausea or vomiting Anesthetic complications: no       Last Vitals:  Vitals:   06/14/16 1145 06/14/16 1205  BP: (!) 168/93 (!) 147/86  Pulse:    Resp:    Temp:      Last Pain:  Vitals:   06/14/16 1154  TempSrc:   PainSc: [place]

## 2016-06-14 NOTE — Anesthesia Preprocedure Evaluation (Addendum)
Anesthesia Evaluation  Patient identified by MRN, date of birth, ID band Patient awake    Reviewed: Allergy & Precautions, NPO status , Patient's Chart, lab work & pertinent test results  Airway Mallampati: II  TM Distance: >3 FB Neck ROM: Full    Dental  (+) Edentulous Upper, Partial Upper, Dental Advisory Given   Pulmonary asthma , COPD,  COPD inhaler, former smoker,    breath sounds clear to auscultation       Cardiovascular hypertension, Pt. on medications  Rhythm:Regular Rate:Normal     Neuro/Psych PSYCHIATRIC DISORDERS Anxiety Depression S/p ACDF    GI/Hepatic GERD  Medicated,  Endo/Other  diabetes, Type 2Morbid obesityObesity   Renal/GU      Musculoskeletal  (+) Arthritis ,   Abdominal (+) + obese,   Peds  Hematology   Anesthesia Other Findings   Reproductive/Obstetrics                            Anesthesia Physical  Anesthesia Plan  ASA: III  Anesthesia Plan: General   Post-op Pain Management:    Induction: Intravenous  Airway Management Planned: LMA  Additional Equipment:   Intra-op Plan:   Post-operative Plan: Extubation in OR  Informed Consent: I have reviewed the patients History and Physical, chart, labs and discussed the procedure including the risks, benefits and alternatives for the proposed anesthesia with the patient or authorized representative who has indicated his/her understanding and acceptance.     Plan Discussed with:   Anesthesia Plan Comments:         Anesthesia Quick Evaluation

## 2016-06-14 NOTE — H&P (Signed)
PREOPERATIVE H&P  Chief Complaint: right knee medial meniscal tear, stress fracture  HPI: Mariah Henderson is a 66 y.o. female who presents for surgical treatment of right knee medial meniscal tear, stress fracture.  She denies any changes in medical history.  Past Medical History:  Diagnosis Date  . Acute medial meniscus tear    right knee  . Anxiety   . Asthma   . Cervical spondylosis   . Chronic serous otitis media   . Constipation   . COPD (chronic obstructive pulmonary disease) ([place])   . Depression    followed by [name]; no longer of depakote, seroquel  . Foot drop   . GERD (gastroesophageal reflux disease)   . Heart murmur   . Hyperlipidemia   . Hypertension   . Low back pain   . Obesity   . Paraumbilical hernia   . Pre-diabetes    Past Surgical History:  Procedure Laterality Date  . ABDOMINAL HYSTERECTOMY    . ANTERIOR CERVICAL DECOMP/DISCECTOMY FUSION N/A 10/05/2014   Procedure: ACDF - C5-C6 - C6-C7;  Surgeon: Karie Chimera, MD;  Location: [facility];  Service: Neurosurgery;  Laterality: N/A;  ACDF - C5-C6 - C6-C7  . CHOLECYSTECTOMY    . COLONOSCOPY    . FOOT SURGERY    . HERNIA REPAIR  1999  . RADIOACTIVE SEED GUIDED EXCISIONAL BREAST BIOPSY Left 06/23/2014   Procedure: RADIOACTIVE SEED GUIDED EXCISIONAL BREAST BIOPSY;  Surgeon: Stark Klein, MD;  Location: [facility];  Service: General;  Laterality: Left;   Social History   Social History  . Marital status: Divorced    Spouse name: N/A  . Number of children: N/A  . Years of education: N/A   Social History Main Topics  . Smoking status: Former Smoker    Types: Cigarettes    Quit date: 01/17/1996  . Smokeless tobacco: Never Used  . Alcohol use No  . Drug use: No  . Sexual activity: Not Currently   Other Topics Concern  . None   Social History Narrative  . None   Family History  Problem Relation Age of Onset  . Colon cancer Neg Hx    No Known Allergies Prior to  Admission medications   Medication Sig Start Date End Date Taking? Authorizing Provider  acetaminophen-codeine (TYLENOL #3) 300-30 MG tablet Take 1 tablet by mouth every 8 (eight) hours as needed for moderate pain. 06/06/16  Yes Leandrew Koyanagi, MD  atorvastatin (LIPITOR) 40 MG tablet take 1 tablet by mouth once daily 05/01/16  Yes Sid Falcon, MD  benazepril (LOTENSIN) 20 MG tablet take 1 tablet by mouth once daily 05/03/16  Yes Sid Falcon, MD  cholecalciferol (VITAMIN D) 1000 units tablet Take 2,000 Units by mouth daily.   Yes [provider]  diclofenac (VOLTAREN) 75 MG EC tablet Take 1 tablet (75 mg total) by mouth 2 (two) times daily. 05/29/16  Yes Leandrew Koyanagi, MD  Diclofenac Sodium (PENNSAID) 2 % SOLN Apply 2 G  bid prn pain. 06/02/16  Yes Leandrew Koyanagi, MD  diclofenac sodium (VOLTAREN) 1 % GEL Apply 2 g topically 4 (four) times daily. 06/06/16  Yes Leandrew Koyanagi, MD  fluticasone (FLONASE) 50 MCG/ACT nasal spray Place 1 spray into both nostrils daily. 08/09/15  Yes Bartholomew Crews, MD  Fluticasone-Salmeterol (ADVAIR DISKUS) 100-50 MCG/DOSE AEPB Inhale 1 puff into the lungs 2 (two) times daily. 06/28/15 06/27/16 Yes Milagros Loll, MD  furosemide (LASIX) 20  MG tablet take 1 tablet by mouth once daily 12/27/15  Yes Bartholomew Crews, MD  gabapentin (NEURONTIN) 800 MG tablet Take 800-1,600 mg by mouth at bedtime as needed (for pain).    Yes [provider]  loratadine (CLARITIN) 10 MG tablet take 1 tablet by mouth once daily 01/24/16  Yes Sid Falcon, MD  ranitidine (ZANTAC) 150 MG tablet Take 1 tablet (150 mg total) by mouth 2 (two) times daily. 03/08/16  Yes Sid Falcon, MD  traMADol (ULTRAM) 50 MG tablet Take 1 tablet (50 mg total) by mouth every 6 (six) hours as needed. Patient taking differently: Take 50 mg by mouth every 6 (six) hours as needed for moderate pain.  03/08/16  Yes Sid Falcon, MD  traZODone (DESYREL) 50 MG tablet Take 25-50 mg by mouth at  bedtime as needed for sleep.    Yes [provider]  triamcinolone cream (KENALOG) 0.1 % Apply topically 2 (two) times daily. Patient taking differently: Apply 1 application topically 2 (two) times daily as needed (for itching/irritation).  08/10/15  Yes Sid Falcon, MD  albuterol (PROVENTIL HFA;VENTOLIN HFA) 108 (90 Base) MCG/ACT inhaler Inhale 1-2 puffs into the lungs every 6 (six) hours as needed for wheezing or shortness of breath.    [provider]  albuterol (PROVENTIL) (2.5 MG/3ML) 0.083% nebulizer solution Take 3 mLs (2.5 mg total) by nebulization every 6 (six) hours as needed for wheezing. 09/23/14   Sid Falcon, MD  HYDROcodone-acetaminophen (NORCO) 5-325 MG tablet Take 1 tablet by mouth every 6 (six) hours as needed. 06/06/16   Leandrew Koyanagi, MD  ipratropium (ATROVENT) 0.02 % nebulizer solution Take 0.5 mg by nebulization every 6 (six) hours as needed for wheezing or shortness of breath.    [provider]  ONE TOUCH ULTRA TEST test strip  04/23/16   [provider]     Positive ROS: All other systems have been reviewed and were otherwise negative with the exception of those mentioned in the HPI and as above.  Physical Exam: General: Alert, no acute distress Cardiovascular: No pedal edema Respiratory: No cyanosis, no use of accessory musculature GI: abdomen soft Skin: No lesions in the area of chief complaint Neurologic: Sensation intact distally Psychiatric: Patient is competent for consent with normal mood and affect Lymphatic: no lymphedema  MUSCULOSKELETAL: exam stable  Assessment: right knee medial meniscal tear, stress fracture  Plan: Plan for Procedure(s): RIGHT KNEE ARTHROSCOPY WITH PARTIAL MEDIAL MENISCECTOMY, SUBCHONDROPLASTY KNEE ARTHROSCOPY WITH SUBCHONDROPLASTY  The risks benefits and alternatives were discussed with the patient including but not limited to the risks of nonoperative treatment, versus surgical intervention  including infection, bleeding, nerve injury,  blood clots, cardiopulmonary complications, morbidity, mortality, among others, and they were willing to proceed.   Eduard Roux, MD   06/14/2016 8:13 AM

## 2016-06-14 NOTE — Progress Notes (Signed)
Dr. Gifford Shave notified of patient's high blood pressure (see vital signs). Labetolol ordered.

## 2016-06-14 NOTE — Discharge Instructions (Signed)
° ° °Post-operative patient instructions  °Knee Arthroscopy  ° °• Ice:  Place intermittent ice or cooler pack over your knee, 30 minutes on and 30 minutes off.  Continue this for the first 72 hours after surgery, then save ice for use after therapy sessions or on more active days.   °• Weight:  You may bear weight on your leg as your symptoms allow. °• Crutches:  Use crutches (or walker) to assist in walking until told to discontinue by your physical therapist or physician. This will help to reduce pain. °• Strengthening:  Perform simple thigh squeezes (isometric quad contractions) and straight leg lifts as you are able (3 sets of 5 to 10 repetitions, 3 times a day).  For the leg lifts, have someone support under your ankle in the beginning until you have increased strength enough to do this on your own.  To help get started on thigh squeezes, place a pillow under your knee and push down on the pillow with back of knee (sometimes easier to do than with your leg fully straight). °• Motion:  Perform gentle knee motion as tolerated - this is gentle bending and straightening of the knee. Seated heel slides: you can start by sitting in a chair, remove your brace, and gently slide your heel back on the floor - allowing your knee to bend. Have someone help you straighten your knee (or use your other leg/foot hooked under your ankle.  °• Dressing:  Perform 1st dressing change at 2 days postoperative. A moderate amount of blood tinged drainage is to be expected.  So if you bleed through the dressing on the first or second day or if you have fevers, it is fine to change the dressing/check the wounds early and redress wound. Elevate your leg.  If it bleeds through again, or if the incisions are leaking frank blood, please call the office. May change dressing every 1-2 days thereafter to help watch wounds. Can purchase Tegaderm (or 3M Nexcare) water resistant dressings at local pharmacy / [facility]. °• Shower:  Light shower is  ok after 2 days.  Please take shower, NO bath. Recover with gauze and ace wrap to help keep wounds protected.   °• Pain medication:  A narcotic pain medication has been prescribed.  Take as directed.  Typically you need narcotic pain medication more regularly during the first 3 to 5 days after surgery.  Decrease your use of the medication as the pain improves.  Narcotics can sometimes cause constipation, even after a few doses.  If you have problems with constipation, you can take an over the counter stool softener or light laxative.  If you have persistent problems, please notify your physician’s office. °• Physical therapy: Additional activity guidelines to be provided by your physician or physical therapist at follow-up visits.  °• Driving: Do not recommend driving x 2 weeks post surgical, especially if surgery performed on right side. Should not drive while taking narcotic pain medications. It typically takes at least 2 weeks to restore sufficient neuromuscular function for normal reaction times for driving safety.  °• Call [phone] for questions or problems. Evenings you will be forwarded to the hospital operator.  Ask for the orthopaedic physician on call. Please call if you experience:  °  °o Redness, foul smelling, or persistent drainage from the surgical site  °o worsening knee pain and swelling not responsive to medication  °o any calf pain and or swelling of the lower leg  °o temperatures greater than   101.5 F °o other questions or concerns ° ° °Thank you for allowing us to be a part of your care. ° ° °Post Anesthesia Home Care Instructions ° °Activity: °Get plenty of rest for the remainder of the day. A responsible individual must stay with you for 24 hours following the procedure.  °For the next 24 hours, DO NOT: °-Drive a car °-Operate machinery °-Drink alcoholic beverages °-Take any medication unless instructed by your physician °-Make any legal decisions or sign important papers. ° °Meals: °Start  with liquid foods such as gelatin or soup. Progress to regular foods as tolerated. Avoid greasy, spicy, heavy foods. If nausea and/or vomiting occur, drink only clear liquids until the nausea and/or vomiting subsides. Call your physician if vomiting continues. ° °Special Instructions/Symptoms: °Your throat may feel dry or sore from the anesthesia or the breathing tube placed in your throat during surgery. If this causes discomfort, gargle with warm salt water. The discomfort should disappear within 24 hours. ° °If you had a scopolamine patch placed behind your ear for the management of post- operative nausea and/or vomiting: ° °1. The medication in the patch is effective for 72 hours, after which it should be removed.  Wrap patch in a tissue and discard in the trash. Wash hands thoroughly with soap and water. °2. You may remove the patch earlier than 72 hours if you experience unpleasant side effects which may include dry mouth, dizziness or visual disturbances. °3. Avoid touching the patch. Wash your hands with soap and water after contact with the patch. °  ° ° °

## 2016-06-14 NOTE — Op Note (Signed)
   Date of Surgery: 06/14/2016  INDICATIONS: Mariah Henderson is a 66 y.o.-year-old female with a right knee chondromalacia and medial tibial plateau fracture;  The patient did consent to the procedure after discussion of the risks and benefits.  PREOPERATIVE DIAGNOSIS:  1. Right knee tricompartmental chondromalacia 2. Right knee severe synovitis 3. Right knee degenerative medial and lateral meniscal tears 4. Nondisplaced right medial tibial plateau fracture   POSTOPERATIVE DIAGNOSIS: Same.  PROCEDURE:  1. Arthroscopically aided internal fixation of nondisplaced right medial tibial plateau fracture 2. Arthroscopic major synovectomy of right knee 3. Arthroscopic chondroplasty of medial and lateral femoral condyle  SURGEON: N. Eduard Roux, M.D.  ASSIST: None.  ANESTHESIA:  general  IV FLUIDS AND URINE: See anesthesia.  ESTIMATED BLOOD LOSS: Minimal  mL.  IMPLANTS: None   DRAINS: None   COMPLICATIONS: None.  DESCRIPTION OF PROCEDURE: The patient was brought to the operating room and placed supine  on the operating table.  The patient had been signed prior to the procedure and this was documented. The patient had the anesthesia placed by the anesthesiologist.  A time-out was performed to confirm that this was the correct patient, site, side and location. The patient did receive antibiotics prior to the incision and was re-dosed during the procedure as needed at indicated intervals.  A tourniquet was  placed.  The patient had the operative extremity prepped and draped in the standard surgical fashion.    The standard anterolateral and anteromedial arthroscopy portals were created. We first performed a diagnostic knee arthroscopy beginning with the medial compartment. I performed a synovectomy with oscillating shaver of this compartment for the severe synovitis. I then visualized the medial compartment which showed multiple steroid deposits. She did have a small degenerative tear of the medial  meniscus. She did also have grade 3 chondromalacia of the medial femoral condyle. Chondroplasty was performed of the medial femoral condyle with a full radius shaver back to a stable and smooth border. I then performed further synovectomy of the femoral notch in the lateral compartment. Lateral compartment also exhibited grade 3 chondromalacia lateral femoral condyle which I performed a chondroplasty on. There was also a small degenerative tear of the lateral meniscus. Further synovectomy was performed of the patellofemoral compartment. I performed gentle chondroplasty of the patella and femoral trochlea with an oscillating shaver. I then turned my attention to fixation of the medial tibial plateau fracture. Using a spinal needle I was able to localize the area of pathology under fluoroscopic guidance. A stab incision was then made and the guide pin was drilled into the medial tibial plateau proximally 1 cm below the level of the joint. The placement of the pin was confirmed under orthogonal views. The cement was then mixed and injected into the bony void in a retrograde fashion. Sequential x-rays were taken to confirm appropriate apposition of the calcium phosphate cement within the tibial plateau. The cement was then allowed to harden. Final x-rays were then taken after the guidepin was removed. The arthroscope was then placed back into the knee to confirm no intra-articular leakage of the cement. The knee was then thoroughly lavaged. Excess fluid was drained from the knee. The incisions were closed with interrupted 3-0 nylon sutures. Sterile dressings were applied. Patient tolerated procedure well had no immediate competitions.   POSTOPERATIVE PLAN: Patient will be discharged home and will be allowed to weight-bear with crutches.  Azucena Cecil, MD [place] 9:56 AM

## 2016-06-14 NOTE — Transfer of Care (Signed)
Immediate Anesthesia Transfer of Care Note  Patient: Mariah Henderson  Procedure(s) Performed: Procedure(s): RIGHT KNEE ARTHROSCOPY WITH PARTIAL MEDIAL MENISCECTOMY, SUBCHONDROPLASTY (Right) KNEE ARTHROSCOPY WITH SUBCHONDROPLASTY (Right)  Patient Location: PACU  Anesthesia Type:General  Level of Consciousness: sedated  Airway & Oxygen Therapy: Patient Spontanous Breathing and Patient connected to face mask oxygen  Post-op Assessment: Report given to RN and Post -op Vital signs reviewed and stable  Post vital signs: Reviewed and stable  Last Vitals:  Vitals:   06/14/16 0743  BP: (!) 149/68  Pulse: 78  Resp: 18  Temp: 36.8 C    Last Pain:  Vitals:   06/14/16 0743  TempSrc: Oral  PainSc: 8       Patients Stated Pain Goal: 3 (65/53/74 8270)  Complications: No apparent anesthesia complications

## 2016-06-15 ENCOUNTER — Encounter (HOSPITAL_BASED_OUTPATIENT_CLINIC_OR_DEPARTMENT_OTHER): Payer: Self-pay | Admitting: Orthopaedic Surgery

## 2016-06-20 ENCOUNTER — Inpatient Hospital Stay (INDEPENDENT_AMBULATORY_CARE_PROVIDER_SITE_OTHER): Payer: Medicare HMO | Admitting: Orthopaedic Surgery

## 2016-06-21 ENCOUNTER — Other Ambulatory Visit (INDEPENDENT_AMBULATORY_CARE_PROVIDER_SITE_OTHER): Payer: Self-pay | Admitting: Orthopaedic Surgery

## 2016-06-21 NOTE — Telephone Encounter (Signed)
Approve percocet #20

## 2016-06-21 NOTE — Telephone Encounter (Signed)
SEE MESSAGE

## 2016-06-22 ENCOUNTER — Other Ambulatory Visit (INDEPENDENT_AMBULATORY_CARE_PROVIDER_SITE_OTHER): Payer: Self-pay | Admitting: Orthopaedic Surgery

## 2016-06-22 ENCOUNTER — Telehealth (INDEPENDENT_AMBULATORY_CARE_PROVIDER_SITE_OTHER): Payer: Self-pay | Admitting: Orthopaedic Surgery

## 2016-06-22 MED ORDER — OXYCODONE-ACETAMINOPHEN 5-325 MG PO TABS: 1.0000 | ORAL_TABLET | Freq: Every day | ORAL | 0 refills | Status: DC | PRN

## 2016-06-22 NOTE — Telephone Encounter (Signed)
Rx ready for pick up in our office. Pt aware

## 2016-06-22 NOTE — Telephone Encounter (Signed)
faxed

## 2016-06-22 NOTE — Telephone Encounter (Signed)
Chilon with [facility] called needing office note for DOS 05/16/16 faxed to Dr. Daryll Drown. The fax# is [phone]

## 2016-06-27 ENCOUNTER — Ambulatory Visit (INDEPENDENT_AMBULATORY_CARE_PROVIDER_SITE_OTHER): Payer: Medicare HMO | Admitting: Orthopaedic Surgery

## 2016-06-27 DIAGNOSIS — M23321 Other meniscus derangements, posterior horn of medial meniscus, right knee: Secondary | ICD-10-CM

## 2016-06-27 DIAGNOSIS — M84361A Stress fracture, right tibia, initial encounter for fracture: Secondary | ICD-10-CM

## 2016-06-27 NOTE — Progress Notes (Signed)
Patient is 2 weeks status post right knee scope with sub-chondroplasty. She is walking with a cane. Overall her pain is well-controlled. She is doing well. The incisions are healed. The sutures were removed. Referral to physical therapy was made today. Follow-up in 4 weeks for recheck.

## 2016-06-28 NOTE — Progress Notes (Signed)
Patient was seen by Dr. Erlinda Hong and was placed on my schedule by error

## 2016-06-30 ENCOUNTER — Telehealth (INDEPENDENT_AMBULATORY_CARE_PROVIDER_SITE_OTHER): Payer: Self-pay | Admitting: Orthopaedic Surgery

## 2016-06-30 NOTE — Telephone Encounter (Signed)
Patient called asking what she could use on her knee, she stated the skin around the incision is tight feeling and dry. CB # [phone]

## 2016-07-02 NOTE — Telephone Encounter (Signed)
Neosporin twice a day

## 2016-07-03 ENCOUNTER — Ambulatory Visit: Payer: Medicare HMO | Admitting: Physical Therapy

## 2016-07-04 ENCOUNTER — Inpatient Hospital Stay (INDEPENDENT_AMBULATORY_CARE_PROVIDER_SITE_OTHER): Payer: Medicare HMO | Admitting: Orthopaedic Surgery

## 2016-07-04 DIAGNOSIS — R69 Illness, unspecified: Secondary | ICD-10-CM | POA: Diagnosis not present

## 2016-07-06 ENCOUNTER — Encounter: Payer: Self-pay | Admitting: Physical Therapy

## 2016-07-06 ENCOUNTER — Ambulatory Visit: Payer: Medicare HMO | Attending: Orthopaedic Surgery | Admitting: Physical Therapy

## 2016-07-06 DIAGNOSIS — R293 Abnormal posture: Secondary | ICD-10-CM | POA: Diagnosis present

## 2016-07-06 DIAGNOSIS — G8929 Other chronic pain: Secondary | ICD-10-CM | POA: Insufficient documentation

## 2016-07-06 DIAGNOSIS — M25612 Stiffness of left shoulder, not elsewhere classified: Secondary | ICD-10-CM | POA: Insufficient documentation

## 2016-07-06 DIAGNOSIS — M25611 Stiffness of right shoulder, not elsewhere classified: Secondary | ICD-10-CM | POA: Diagnosis present

## 2016-07-06 DIAGNOSIS — M6281 Muscle weakness (generalized): Secondary | ICD-10-CM | POA: Insufficient documentation

## 2016-07-06 DIAGNOSIS — M25511 Pain in right shoulder: Secondary | ICD-10-CM | POA: Insufficient documentation
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
## 2016-07-16 ENCOUNTER — Other Ambulatory Visit (INDEPENDENT_AMBULATORY_CARE_PROVIDER_SITE_OTHER): Payer: Self-pay | Admitting: Orthopaedic Surgery

## 2016-07-17 NOTE — Telephone Encounter (Signed)
Ok to rf? 

## 2016-07-24 DIAGNOSIS — R69 Illness, unspecified: Secondary | ICD-10-CM | POA: Diagnosis not present

## 2016-07-25 ENCOUNTER — Ambulatory Visit (INDEPENDENT_AMBULATORY_CARE_PROVIDER_SITE_OTHER): Payer: Medicare HMO | Admitting: Orthopaedic Surgery

## 2016-07-25 DIAGNOSIS — M23321 Other meniscus derangements, posterior horn of medial meniscus, right knee: Secondary | ICD-10-CM

## 2016-07-25 DIAGNOSIS — M84361A Stress fracture, right tibia, initial encounter for fracture: Secondary | ICD-10-CM

## 2016-07-25 NOTE — Progress Notes (Signed)
Patient is 6 weeks status post right knee arthroscopy with subchondroplasty.  She is overall improving. She still working with physical therapy and walking with a cane. She endorses some tenderness on that medial side of the knee and some pain in the back of the knee. On exam she has no evidence of infection. She does have mild tenderness on the medial aspect of the knee where the subchondral plasty was performed.  Overall she feels like the surgery has helped. I recommend that she continue with physical therapy for strengthening and mobilization. I will see her back in 6 weeks for recheck.

## 2016-07-26 ENCOUNTER — Encounter: Payer: Self-pay | Admitting: Physical Therapy

## 2016-07-26 ENCOUNTER — Ambulatory Visit: Payer: Medicare HMO | Attending: Orthopaedic Surgery | Admitting: Physical Therapy

## 2016-07-26 DIAGNOSIS — M6281 Muscle weakness (generalized): Secondary | ICD-10-CM | POA: Insufficient documentation

## 2016-07-26 DIAGNOSIS — M25611 Stiffness of right shoulder, not elsewhere classified: Secondary | ICD-10-CM | POA: Diagnosis present

## 2016-07-26 DIAGNOSIS — G8929 Other chronic pain: Secondary | ICD-10-CM | POA: Diagnosis present

## 2016-07-26 DIAGNOSIS — R293 Abnormal posture: Secondary | ICD-10-CM

## 2016-07-26 DIAGNOSIS — M25612 Stiffness of left shoulder, not elsewhere classified: Secondary | ICD-10-CM | POA: Insufficient documentation

## 2016-07-26 DIAGNOSIS — M25511 Pain in right shoulder: Secondary | ICD-10-CM | POA: Diagnosis not present
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
## 2016-07-28 ENCOUNTER — Encounter: Payer: Medicare HMO | Admitting: Physical Therapy

## 2016-08-01 ENCOUNTER — Encounter: Payer: Self-pay | Admitting: Physical Therapy

## 2016-08-01 ENCOUNTER — Ambulatory Visit: Payer: Medicare HMO | Admitting: Physical Therapy

## 2016-08-01 DIAGNOSIS — M25511 Pain in right shoulder: Secondary | ICD-10-CM | POA: Diagnosis not present

## 2016-08-01 DIAGNOSIS — M25611 Stiffness of right shoulder, not elsewhere classified: Secondary | ICD-10-CM

## 2016-08-01 DIAGNOSIS — M6281 Muscle weakness (generalized): Secondary | ICD-10-CM

## 2016-08-01 DIAGNOSIS — G8929 Other chronic pain: Secondary | ICD-10-CM

## 2016-08-01 DIAGNOSIS — M25612 Stiffness of left shoulder, not elsewhere classified: Secondary | ICD-10-CM

## 2016-08-01 DIAGNOSIS — R293 Abnormal posture: Secondary | ICD-10-CM

## 2016-08-01 NOTE — Patient Instructions (Signed)
Remove tape in  A day maybe 2. Remove tape immediately if it bothers you.  Bring knee bract to next session.
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
## 2016-08-03 ENCOUNTER — Ambulatory Visit: Payer: Medicare HMO | Admitting: Physical Therapy

## 2016-08-03 ENCOUNTER — Ambulatory Visit (INDEPENDENT_AMBULATORY_CARE_PROVIDER_SITE_OTHER): Payer: Medicare HMO | Admitting: Internal Medicine

## 2016-08-03 ENCOUNTER — Encounter: Payer: Self-pay | Admitting: Internal Medicine

## 2016-08-03 DIAGNOSIS — E1121 Type 2 diabetes mellitus with diabetic nephropathy: Secondary | ICD-10-CM | POA: Diagnosis not present

## 2016-08-03 DIAGNOSIS — F325 Major depressive disorder, single episode, in full remission: Secondary | ICD-10-CM | POA: Diagnosis not present

## 2016-08-03 DIAGNOSIS — E785 Hyperlipidemia, unspecified: Secondary | ICD-10-CM

## 2016-08-03 DIAGNOSIS — I10 Essential (primary) hypertension: Secondary | ICD-10-CM

## 2016-08-03 DIAGNOSIS — J4520 Mild intermittent asthma, uncomplicated: Secondary | ICD-10-CM

## 2016-08-03 DIAGNOSIS — Z79899 Other long term (current) drug therapy: Secondary | ICD-10-CM

## 2016-08-03 DIAGNOSIS — M23321 Other meniscus derangements, posterior horn of medial meniscus, right knee: Secondary | ICD-10-CM | POA: Diagnosis not present

## 2016-08-03 DIAGNOSIS — E7800 Pure hypercholesterolemia, unspecified: Secondary | ICD-10-CM

## 2016-08-03 DIAGNOSIS — Z87891 Personal history of nicotine dependence: Secondary | ICD-10-CM | POA: Diagnosis not present

## 2016-08-03 DIAGNOSIS — R69 Illness, unspecified: Secondary | ICD-10-CM | POA: Diagnosis not present

## 2016-08-03 DIAGNOSIS — E669 Obesity, unspecified: Secondary | ICD-10-CM | POA: Diagnosis not present

## 2016-08-03 DIAGNOSIS — J45909 Unspecified asthma, uncomplicated: Secondary | ICD-10-CM

## 2016-08-03 LAB — GLUCOSE, CAPILLARY: Glucose-Capillary: 71 mg/dL (ref 65–99)

## 2016-08-03 NOTE — Patient Instructions (Addendum)
Mariah Henderson - -  Thank you for coming in to see me today!  Your blood pressure is doing well, you do not need to take the furosemide every day unless you have swelling in the ankles or legs.  If the swelling does not get better with lasix, you can call me.   For your skin issue, please come back to see me in 6 weeks so I can look at it and possibly remove it.    Thank you!  Come back in 6 weeks.

## 2016-08-03 NOTE — Progress Notes (Signed)
Subjective:    [name], female    DOB: [birth year], 66 y.o.   MRN: [id number]  2 month follow up for HTN  HPI   Mariah Henderson is a 66yo woman with PMH of HTN, diet controlled DM, HLD, Glaucoma, depression, reactive airway disease, osteopenia, recent knee surgery who presents for routine follow up.   Mariah Henderson has concerns today about a mole on her side.  She started noticing that it itched while she was bathing.  She reports that it is under her bra strap in the mid axillary line on the left lateral chest.  She has not noticed any skin breakdown or bleeding.  She does not remember if it use to be smaller.    She further notes that after her recent knee surgery, she is doing well.  She has been going to physical therapy and she thinks that they are working her too hard.  She was hoping I would advise her that she does not need to go to therapy anymore.  I did advise her to continue the course as recommended by her orthopedic surgeon.  She has noticed that her finger deformity on the right hand has gotten worse again and she notes that the surgeon is considering fixing this.  She is only taking tramadol and ibuprofen at this time.    Otherwise doing very well.  CBG log reviewed and she had ranges of 98 - 141 and reported no low blood sugars.  She does note that the lasix is making her pee all the time and often not make it to the bathroom.  She doesn't take it on days she is going out.  Her blood pressure was averaging a little higher today (626 systolic and 948 systolic on recheck) which she was concerned about.    Review of Systems  Constitutional: Positive for activity change (due to knee surgery). Negative for fatigue and fever.  Respiratory: Negative for cough and shortness of breath.   Cardiovascular: Positive for leg swelling (knee). Negative for chest pain.  Genitourinary: Positive for enuresis and frequency. Negative for difficulty urinating and dysuria.    Musculoskeletal: Positive for arthralgias and gait problem (using cane).  Neurological: Negative for dizziness, weakness and light-headedness.  Psychiatric/Behavioral: Positive for dysphoric mood (she is treated for depression).       Objective:   Physical Exam  Constitutional: She is oriented to person, place, and time. She appears well-developed and well-nourished. No distress.  HENT:  Head: Normocephalic and atraumatic.  Cardiovascular: Normal rate, regular rhythm and normal heart sounds.   Pulmonary/Chest: Effort normal and breath sounds normal. No respiratory distress.  Musculoskeletal: She exhibits deformity (right ring finger).  She has a swollen right knee with bandage anteriorly.  She is walking with a cane.  She has no lower extremity swelling.   Neurological: She is alert and oriented to person, place, and time.  Skin: Skin is warm and dry.  Lesion on side looks most consistent with a seborrheic dermatosis.  It is stuck on, circular, heaped up.  It has a portion which is mobile which may be what has changed for her to notice it. Uniform in color.  She has multiple other seb K's on her torso.   Psychiatric: She has a normal mood and affect. Her behavior is normal.    No labs today.       Assessment & Plan:  RTC in 6 weeks for BP and to check skin   Seborrheic keratosis  Spot on chest is consistent with this diagnosis.  It is uniform in color, heaped up, stuck on appearance.  I offered to remove it for her and she is interested as it is causing quite a bit of itching and discomfort.   Plan: Return to clinic in 6 weeks, consider shave biopsy.

## 2016-08-04 NOTE — Assessment & Plan Note (Signed)
Reports that she is doing well on her current inhalers and has no complaints.  She is taking advair and PRN albuterol.  She also takes flonase for her allergic component.   Continue current therapy.

## 2016-08-04 NOTE — Assessment & Plan Note (Signed)
This problem is stable and chronic.  She notes that she has no adverse medication effects from her statin.   Continue current statin - atorvastatin.

## 2016-08-04 NOTE — Assessment & Plan Note (Signed)
She is following with orthopedic surgery and recently had a procedure.  She feels that she is slowly improving.  She is now only taking ibuprofen and tramadol PRN.  She would like to stop PT, but I encouraged her to keep going so that her mobility would improve.    Plan Continue follow up with orthopedics

## 2016-08-04 NOTE — Assessment & Plan Note (Signed)
BP mildly elevated today.  She had not taken her lasix.  She is scheduled to take it daily, but feels that it makes her have to go to the bathroom too often.  I advised her to start taking only PRN for LE edema.  I think her BP is also more elevated due to pain from recent orthopedic procedure. She seems to be slowly improving.   Plan Continue benazapril Return in 6 weeks for BP check, if still elevated, will plan to add a second agent, possibly amlodipine.

## 2016-08-08 ENCOUNTER — Ambulatory Visit: Payer: Medicare HMO | Admitting: Physical Therapy

## 2016-08-08 DIAGNOSIS — M25511 Pain in right shoulder: Secondary | ICD-10-CM | POA: Diagnosis not present

## 2016-08-10 ENCOUNTER — Ambulatory Visit: Payer: Medicare HMO | Admitting: Physical Therapy

## 2016-08-14 ENCOUNTER — Telehealth: Payer: Self-pay | Admitting: Internal Medicine

## 2016-08-14 NOTE — Telephone Encounter (Signed)
Patient has questions about her BP medications and would like a call back.

## 2016-08-14 NOTE — Telephone Encounter (Signed)
Call from pt about BP meds - informed to take Furosemide on PRN basis for any LE edema per Dr Daryll Drown.  And will re-check BP in 6 weeks. Only BP med is Benazepril -voiced understanding. Stated she will check her BP at home and let us know of any elevations.

## 2016-08-14 NOTE — Telephone Encounter (Signed)
Returned pt's call - no answer; left message to give me a call back. 

## 2016-08-15 ENCOUNTER — Encounter: Payer: Medicare HMO | Admitting: Physical Therapy

## 2016-08-15 NOTE — Telephone Encounter (Signed)
Sounds good. Thank you

## 2016-08-17 ENCOUNTER — Encounter: Payer: Medicare HMO | Admitting: Physical Therapy

## 2016-08-22 DIAGNOSIS — H40013 Open angle with borderline findings, low risk, bilateral: Secondary | ICD-10-CM | POA: Diagnosis not present

## 2016-08-22 DIAGNOSIS — H2513 Age-related nuclear cataract, bilateral: Secondary | ICD-10-CM | POA: Diagnosis not present

## 2016-08-22 DIAGNOSIS — H04123 Dry eye syndrome of bilateral lacrimal glands: Secondary | ICD-10-CM | POA: Diagnosis not present

## 2016-08-22 DIAGNOSIS — H10413 Chronic giant papillary conjunctivitis, bilateral: Secondary | ICD-10-CM | POA: Diagnosis not present

## 2016-08-22 DIAGNOSIS — H40031 Anatomical narrow angle, right eye: Secondary | ICD-10-CM | POA: Diagnosis not present

## 2016-09-04 ENCOUNTER — Other Ambulatory Visit (INDEPENDENT_AMBULATORY_CARE_PROVIDER_SITE_OTHER): Payer: Self-pay | Admitting: Orthopaedic Surgery

## 2016-09-05 ENCOUNTER — Ambulatory Visit (INDEPENDENT_AMBULATORY_CARE_PROVIDER_SITE_OTHER): Payer: Medicare HMO | Admitting: Orthopaedic Surgery

## 2016-09-05 DIAGNOSIS — H40031 Anatomical narrow angle, right eye: Secondary | ICD-10-CM | POA: Diagnosis not present

## 2016-09-06 ENCOUNTER — Encounter: Payer: Self-pay | Admitting: Internal Medicine

## 2016-09-06 ENCOUNTER — Ambulatory Visit (INDEPENDENT_AMBULATORY_CARE_PROVIDER_SITE_OTHER): Payer: Medicare HMO | Admitting: Internal Medicine

## 2016-09-06 ENCOUNTER — Ambulatory Visit (HOSPITAL_COMMUNITY)
Admission: RE | Admit: 2016-09-06 | Discharge: 2016-09-06 | Disposition: A | Discharge status: Home / Self Care | Payer: Medicare HMO | Source: Ambulatory Visit | Attending: Internal Medicine | Admitting: Internal Medicine

## 2016-09-06 VITALS — BP 154/79 | HR 75 | Temp 97.8°F | Wt 183.4 lb

## 2016-09-06 DIAGNOSIS — M7989 Other specified soft tissue disorders: Secondary | ICD-10-CM

## 2016-09-06 DIAGNOSIS — R600 Localized edema: Secondary | ICD-10-CM | POA: Diagnosis not present

## 2016-09-06 DIAGNOSIS — M25572 Pain in left ankle and joints of left foot: Secondary | ICD-10-CM | POA: Insufficient documentation

## 2016-09-06 DIAGNOSIS — R5383 Other fatigue: Secondary | ICD-10-CM

## 2016-09-06 DIAGNOSIS — I10 Essential (primary) hypertension: Secondary | ICD-10-CM

## 2016-09-06 DIAGNOSIS — R42 Dizziness and giddiness: Secondary | ICD-10-CM

## 2016-09-06 DIAGNOSIS — Z87891 Personal history of nicotine dependence: Secondary | ICD-10-CM | POA: Diagnosis not present

## 2016-09-06 DIAGNOSIS — M79662 Pain in left lower leg: Secondary | ICD-10-CM

## 2016-09-06 LAB — D-DIMER, QUANTITATIVE: D-Dimer, Quant: 1.38 ug/mL-FEU — ABNORMAL HIGH (ref 0.00–0.50)

## 2016-09-06 NOTE — Progress Notes (Signed)
   CC: For follow-up of her blood pressure.  HPI:  [name] is a 66 y.o.lady with past medical history as listed below came to the clinic for follow-up of her blood pressure. According to patient her Lasix was stopped during previous visit and she was asked to continue benazepril 20 mg daily. Patient is compliant with her medication and she did took it this morning.   She was complaining of feeling fatigued and dizzy mostly when she gets up in the morning. She described her dizziness worse when she stand up after sitting or lying down, it is a mix of being lightheaded and room spinning. According to patient he does have history of vertigo.she does not sleep enough at night and feels tired during the day. She watches TV until late night. She was not aware whether she snores or not. She denies any frequent awakening during sleep.Marland KitchenHer appetite is normal. No heat or cold intolerance.   She was also complaining of worsening lower extremity edema more on the left as compared to the right associated with crampy left foot pain for about 2 weeks. She denies any recent travel or prolonged immobilization. She denies any worsening exertional dyspnea, chest pain, orthopnea or PND  Past Medical History:  Diagnosis Date  . Acute medial meniscus tear    right knee  . Anxiety   . Asthma   . Cervical spondylosis   . Chronic serous otitis media   . Constipation   . COPD (chronic obstructive pulmonary disease) ([place])   . Depression    followed by [name]; no longer of depakote, seroquel  . Foot drop   . GERD (gastroesophageal reflux disease)   . Heart murmur   . Hyperlipidemia   . Hypertension   . Low back pain   . Obesity   . Paraumbilical hernia   . Pre-diabetes    Review of Systems:  As per HPI.  Physical Exam:  Vitals:   09/06/16 1013  BP: 138/76  Pulse: 77  Temp: 98.1 F (36.7 C)  TempSrc: Oral  SpO2: 100%  Weight: 183 lb 6.4 oz (83.2 kg)    General: Vital signs  reviewed.  Patient is well-developed and well-nourished, in no acute distress and cooperative with exam.  Head: Normocephalic and atraumatic. Eyes: EOMI, conjunctivae normal, no scleral icterus.  Cardiovascular: RRR, S1 normal, S2 normal, no murmurs, gallops, or rubs. Pulmonary/Chest: Clear to auscultation bilaterally, no wheezes, rales, or rhonchi. Abdominal: Soft, non-tender, non-distended, BS +, no masses, organomegaly, or guarding present.  Musculoskeletal: marked edema and tenderness along medial side of 40, medial malleolus extending little above on medial side of leg. No erythema or hyperthermia. Extremities:1+ pitting edema on right lower extremity, 2+ on the left up to below knees. Pulses intact and symmetrical. No calf Tenderness. Dix-Hallpike maneuver. Negative Psychiatric: Normal mood and affect.   Assessment & Plan:   See Encounters Tab for problem based charting.  Patient seen with Dr. Angelia Mould.

## 2016-09-06 NOTE — Patient Instructions (Addendum)
Thank you for visiting clinic today. I am doing some blood work because of fewer complained of being fatigued. Please keep yourself well hydrated. Tried to get a good night  sleep. I'm also doing a x-ray of your left foot. We will call you with any abnormal results. You can make an appointment for her than next 4-6 weeks to be seen by your PCP.

## 2016-09-07 LAB — BMP8+ANION GAP
Anion Gap: 14 mmol/L (ref 10.0–18.0)
BUN/Creatinine Ratio: 9 — ABNORMAL LOW (ref 12–28)
BUN: 7 mg/dL — ABNORMAL LOW (ref 8–27)
CO2: 27 mmol/L (ref 20–29)
Calcium: 9.9 mg/dL (ref 8.7–10.3)
Chloride: 104 mmol/L (ref 96–106)
Creatinine, Ser: 0.81 mg/dL (ref 0.57–1.00)
GFR calc Af Amer: 86 mL/min/{1.73_m2} (ref >59)
GFR calc non Af Amer: 75 mL/min/{1.73_m2} (ref >59)
Glucose: 85 mg/dL (ref 65–99)
Potassium: 5.3 mmol/L — ABNORMAL HIGH (ref 3.5–5.2)
Sodium: 145 mmol/L — ABNORMAL HIGH (ref 134–144)

## 2016-09-07 LAB — CBC
Hematocrit: 41.4 % (ref 34.0–46.6)
Hemoglobin: 13.2 g/dL (ref 11.1–15.9)
MCH: 27.9 pg (ref 26.6–33.0)
MCHC: 31.9 g/dL (ref 31.5–35.7)
MCV: 88 fL (ref 79–97)
Platelets: 311 10*3/uL (ref 150–379)
RBC: 4.73 x10E6/uL (ref 3.77–5.28)
RDW: 13.7 % (ref 12.3–15.4)
WBC: 5.6 10*3/uL (ref 3.4–10.8)

## 2016-09-07 LAB — TSH: TSH: 0.613 u[IU]/mL (ref 0.450–4.500)

## 2016-09-07 NOTE — Assessment & Plan Note (Signed)
She was complaining of bilateral lower extremity edema worse on left with tenderness along medial malleolus, medial side of her foot. Her well's score for DVT was between 0-2. Her left ankle x-ray was done which was negative for any acute changes except soft tissue edema. D-dimer was checked which was  Elevated at 1.38. Order was placed for left lower extremity venous Doppler to rule out DVT.

## 2016-09-07 NOTE — Assessment & Plan Note (Signed)
BP Readings from Last 3 Encounters:  09/06/16 (!) 154/79  08/03/16 139/62  06/14/16 (!) 147/86   Her blood pressure was elevated. Orthostatic vitals were checked because of her history of postural dizziness. They were negative with readings of 139/75 with lying, 141/81 with sitting and 154/79 with standing.   Patient is on benazepril 20 mg daily, she takes Lasix when necessary. BMP checked for her complaint of fatigue shows borderline elevation of potassium at 5.3. I tried calling patient multiple times, only number listed in her chart has an engaged tone,unable to reach next of kin too.We will try calling her again. She should hold off her benazepril and continue taking Lasix daily for next few days. I would like to see her back on Monday for a repeat lab.

## 2016-09-07 NOTE — Assessment & Plan Note (Signed)
She was complaining of feeling fatigued and dizzy mostly in the mornings after getting out of bed. Her [facility] was negative and there was no nystagmus on exam making BPPV less likely. She does not have any signs and symptoms of hypothyroidism. She does not get enough sleep at night as she sleeps very late and watch TV till late night.she was not aware of snoring. Her fatigue most likely is related to her not getting enough sleep, there might be some element of sleep apnea. We checked her TSH, CBC and BMP they were within normal limit except borderline elevation of potassium. She was instructed on good sleep hygiene and to see if that resolves her symptoms. We can consider sending her for sleep study in the future.

## 2016-09-08 ENCOUNTER — Telehealth: Payer: Self-pay

## 2016-09-08 NOTE — Telephone Encounter (Signed)
I called the patient and discuss the lab results with her.

## 2016-09-08 NOTE — Telephone Encounter (Signed)
Requesting lab results. Please call pt back.  

## 2016-09-09 DIAGNOSIS — R69 Illness, unspecified: Secondary | ICD-10-CM | POA: Diagnosis not present

## 2016-09-10 NOTE — Progress Notes (Signed)
Internal Medicine Clinic Attending  I saw and evaluated the patient.  I personally confirmed the key portions of the history and exam documented by Dr. Amin and I reviewed pertinent patient test results.  The assessment, diagnosis, and plan were formulated together and I agree with the documentation in the resident's note. 

## 2016-09-11 ENCOUNTER — Ambulatory Visit (HOSPITAL_COMMUNITY)
Admission: RE | Admit: 2016-09-11 | Discharge: 2016-09-11 | Disposition: A | Discharge status: Home / Self Care | Payer: Medicare HMO | Source: Ambulatory Visit | Attending: Internal Medicine | Admitting: Internal Medicine

## 2016-09-11 ENCOUNTER — Encounter (INDEPENDENT_AMBULATORY_CARE_PROVIDER_SITE_OTHER): Payer: Self-pay | Admitting: Orthopaedic Surgery

## 2016-09-11 ENCOUNTER — Ambulatory Visit (INDEPENDENT_AMBULATORY_CARE_PROVIDER_SITE_OTHER): Payer: Medicare HMO | Admitting: Orthopaedic Surgery

## 2016-09-11 DIAGNOSIS — M84361A Stress fracture, right tibia, initial encounter for fracture: Secondary | ICD-10-CM

## 2016-09-11 DIAGNOSIS — M79662 Pain in left lower leg: Secondary | ICD-10-CM | POA: Diagnosis not present

## 2016-09-11 DIAGNOSIS — R2242 Localized swelling, mass and lump, left lower limb: Secondary | ICD-10-CM | POA: Diagnosis not present

## 2016-09-11 DIAGNOSIS — M23321 Other meniscus derangements, posterior horn of medial meniscus, right knee: Secondary | ICD-10-CM

## 2016-09-11 DIAGNOSIS — M7989 Other specified soft tissue disorders: Secondary | ICD-10-CM | POA: Diagnosis not present

## 2016-09-11 NOTE — Progress Notes (Signed)
*  Preliminary Results* Left lower extremity venous duplex completed. Left lower extremity is negative for deep vein thrombosis. There is no evidence of left Baker's cyst.  09/11/2016 3:01 PM  Maudry Mayhew, BS, RVT, RDCS, RDMS

## 2016-09-11 NOTE — Progress Notes (Signed)
Patient is almost 3 months status post right knee arthroscopy with subchondroplasty.  She is doing well. She does physical therapy twice a week. No longer using a cane. Overall she is happy with her progress and recovery from her surgery. Her exam of her right knee is benign. Well-healed's surgical scars. At this point patient has reached MMI. She will call us back if she needs anything. 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

## 2016-09-20 DIAGNOSIS — H40032 Anatomical narrow angle, left eye: Secondary | ICD-10-CM | POA: Diagnosis not present

## 2016-10-03 DIAGNOSIS — R69 Illness, unspecified: Secondary | ICD-10-CM | POA: Diagnosis not present

## 2016-10-11 ENCOUNTER — Ambulatory Visit (INDEPENDENT_AMBULATORY_CARE_PROVIDER_SITE_OTHER): Payer: Medicare HMO | Admitting: Internal Medicine

## 2016-10-11 VITALS — BP 148/68 | HR 98 | Temp 98.6°F | Ht 60.0 in | Wt 183.2 lb

## 2016-10-11 DIAGNOSIS — I10 Essential (primary) hypertension: Secondary | ICD-10-CM | POA: Diagnosis not present

## 2016-10-11 DIAGNOSIS — F325 Major depressive disorder, single episode, in full remission: Secondary | ICD-10-CM | POA: Diagnosis not present

## 2016-10-11 DIAGNOSIS — Z23 Encounter for immunization: Secondary | ICD-10-CM

## 2016-10-11 DIAGNOSIS — H40031 Anatomical narrow angle, right eye: Secondary | ICD-10-CM | POA: Diagnosis not present

## 2016-10-11 DIAGNOSIS — R69 Illness, unspecified: Secondary | ICD-10-CM | POA: Diagnosis not present

## 2016-10-11 MED ORDER — HYDROCHLOROTHIAZIDE 12.5 MG PO CAPS: 12.5000 mg | ORAL_CAPSULE | Freq: Every day | ORAL | 2 refills | Status: DC

## 2016-10-11 NOTE — Progress Notes (Signed)
   CC: BP follow up  HPI:  [name] is a 66 y.o. she reports checking her bp at home and getting various readings 156/88, 144/80, 180/85, the lowest she has been getting is in the 140's.  She has continued to stop lasix since her last visit due to not being able to make it to the bathroom.    Please see A&P for status of the patient's chronic medical conditions  Past Medical History:  Diagnosis Date  . Acute medial meniscus tear    right knee  . Anxiety   . Asthma   . Cervical spondylosis   . Chronic serous otitis media   . Constipation   . COPD (chronic obstructive pulmonary disease) ([place])   . Depression    followed by [name]; no longer of depakote, seroquel  . Foot drop   . GERD (gastroesophageal reflux disease)   . Heart murmur   . Hyperlipidemia   . Hypertension   . Low back pain   . Obesity   . Paraumbilical hernia   . Pre-diabetes    Review of Systems:  ROS: Pulmonary: pt denies increased work of breathing, shortness of breath,  Cardiac: pt denies palpitations, chest pain,  Abdominal: pt denies abdominal pain, nausea, vomiting, or diarrhea  Physical Exam:  Vitals:   10/11/16 1023  BP: (!) 148/68  Pulse: 98  Temp: 98.6 F (37 C)  TempSrc: Oral  SpO2: 100%  Weight: 183 lb 3.2 oz (83.1 kg)  Height: 5' (1.524 m)   Physical Exam  Constitutional: She is oriented to person, place, and time. No distress.  Cardiovascular: Normal rate, regular rhythm and normal heart sounds.  Exam reveals no gallop and no friction rub.   No murmur heard. Pulmonary/Chest: Effort normal and breath sounds normal. No respiratory distress. She has no wheezes. She has no rales. She exhibits no tenderness.  Abdominal: Soft. Bowel sounds are normal. She exhibits no distension and no mass. There is no tenderness. There is no rebound and no guarding.  Neurological: She is alert and oriented to person, place, and time.  Skin: She is not diaphoretic.    Social History    Social History  . Marital status: Divorced    Spouse name: N/A  . Number of children: N/A  . Years of education: N/A   Occupational History  . Not on file.   Social History Main Topics  . Smoking status: Former Smoker    Types: Cigarettes    Quit date: 01/17/1996  . Smokeless tobacco: Never Used  . Alcohol use No  . Drug use: No  . Sexual activity: Not Currently   Other Topics Concern  . Not on file   Social History Narrative  . No narrative on file    Family History  Problem Relation Age of Onset  . Colon cancer Neg Hx     Assessment & Plan:   See Encounters Tab for problem based charting.  Patient seen with Dr. Beryle Beams

## 2016-10-11 NOTE — Assessment & Plan Note (Signed)
Pt denies depressive symptoms today.  She has been upbeat lately and has been sleeping well, with a good appetite.    -continue trazadone 50mg , zoloft 25mg .

## 2016-10-11 NOTE — Assessment & Plan Note (Signed)
Pts blood pressure still elevated today, and we went over the measurements she was getting at home that were elevated as well.  Suggested to the patient HCTZ, pt says she has been on this in the past and is unsure why she is not taking it currently.  Spent ample time looking through the chart looks like she was on hctz 25mg  in the past but there is no mention of why it was discontinued.    BP Readings from Last 3 Encounters:  10/11/16 (!) 148/68  09/06/16 (!) 154/79  08/03/16 139/62   -continue benazepril 20mg  and add HCTZ 12.5mg .  -This combination will hopefully help balance pts potassium somewhat and help optimize her blood pressure -will order bmp and have pt come in for a recheck in a few days after being on HCTZ.

## 2016-10-11 NOTE — Patient Instructions (Signed)
We have added Hydrochlorathiazide 12.5mg  to help control your blood pressures.  Please keep a log of your blood pressures so we can adjust further at your next visit if necessary.  This medicine should not make you urinate as much as the lasix.  If you're still having problems getting to the bathroom on this medicine let us know.  Keep up the great work.

## 2016-10-12 NOTE — Progress Notes (Signed)
Medicine attending: I personally interviewed and briefly examined this patient on the day of the patient visit and reviewed pertinent clinical  andlaboratory data  with resident physician Dr. Vickki Muff and we discussed a management plan. We will try alternative diuretic in view of pt complaints of uncomfortable, frequent urination on furosemide.

## 2016-10-27 ENCOUNTER — Other Ambulatory Visit: Payer: Self-pay | Admitting: Internal Medicine

## 2016-10-27 DIAGNOSIS — R69 Illness, unspecified: Secondary | ICD-10-CM | POA: Diagnosis not present

## 2016-10-27 NOTE — Telephone Encounter (Signed)
Pt states she needs the test strips by today. She is completely out. Requesting the refill to be sent to [facility] on [address].

## 2016-11-08 ENCOUNTER — Other Ambulatory Visit: Payer: Self-pay | Admitting: Internal Medicine

## 2016-11-08 DIAGNOSIS — H2513 Age-related nuclear cataract, bilateral: Secondary | ICD-10-CM | POA: Diagnosis not present

## 2016-11-08 DIAGNOSIS — H20011 Primary iridocyclitis, right eye: Secondary | ICD-10-CM | POA: Diagnosis not present

## 2016-11-08 DIAGNOSIS — J4540 Moderate persistent asthma, uncomplicated: Secondary | ICD-10-CM

## 2016-11-08 DIAGNOSIS — J302 Other seasonal allergic rhinitis: Secondary | ICD-10-CM

## 2016-11-08 MED ORDER — FLUTICASONE-SALMETEROL 100-50 MCG/DOSE IN AEPB: 1.0000 | INHALATION_SPRAY | Freq: Two times a day (BID) | RESPIRATORY_TRACT | 6 refills | Status: DC

## 2016-11-08 NOTE — Telephone Encounter (Signed)
Fluticasone-Salmeterol (ADVAIR DISKUS) 100-50 MCG/DOSE AEPB(Expired),  albuterol (PROVENTIL HFA;VENTOLIN HFA) 108 (90 Base) MCG/ACT inhaler, [facility] @ [facility] on [address].

## 2016-11-23 ENCOUNTER — Other Ambulatory Visit: Payer: Self-pay | Admitting: Internal Medicine

## 2016-11-24 DIAGNOSIS — H20011 Primary iridocyclitis, right eye: Secondary | ICD-10-CM | POA: Diagnosis not present

## 2016-12-06 ENCOUNTER — Ambulatory Visit (INDEPENDENT_AMBULATORY_CARE_PROVIDER_SITE_OTHER): Payer: Medicare HMO | Admitting: Internal Medicine

## 2016-12-06 ENCOUNTER — Other Ambulatory Visit: Payer: Self-pay

## 2016-12-06 ENCOUNTER — Telehealth: Payer: Self-pay | Admitting: *Deleted

## 2016-12-06 ENCOUNTER — Encounter: Payer: Self-pay | Admitting: Internal Medicine

## 2016-12-06 VITALS — BP 133/62 | HR 89 | Temp 98.1°F | Ht 60.0 in | Wt 180.7 lb

## 2016-12-06 DIAGNOSIS — E1142 Type 2 diabetes mellitus with diabetic polyneuropathy: Secondary | ICD-10-CM

## 2016-12-06 DIAGNOSIS — K219 Gastro-esophageal reflux disease without esophagitis: Secondary | ICD-10-CM

## 2016-12-06 DIAGNOSIS — M8949 Other hypertrophic osteoarthropathy, multiple sites: Secondary | ICD-10-CM

## 2016-12-06 DIAGNOSIS — Z79811 Long term (current) use of aromatase inhibitors: Secondary | ICD-10-CM

## 2016-12-06 DIAGNOSIS — H42 Glaucoma in diseases classified elsewhere: Secondary | ICD-10-CM

## 2016-12-06 DIAGNOSIS — R42 Dizziness and giddiness: Secondary | ICD-10-CM | POA: Diagnosis not present

## 2016-12-06 DIAGNOSIS — M150 Primary generalized (osteo)arthritis: Secondary | ICD-10-CM

## 2016-12-06 DIAGNOSIS — Z79899 Other long term (current) drug therapy: Secondary | ICD-10-CM

## 2016-12-06 DIAGNOSIS — M8580 Other specified disorders of bone density and structure, unspecified site: Secondary | ICD-10-CM

## 2016-12-06 DIAGNOSIS — M25511 Pain in right shoulder: Secondary | ICD-10-CM | POA: Diagnosis not present

## 2016-12-06 DIAGNOSIS — H409 Unspecified glaucoma: Secondary | ICD-10-CM

## 2016-12-06 DIAGNOSIS — Z79891 Long term (current) use of opiate analgesic: Secondary | ICD-10-CM

## 2016-12-06 DIAGNOSIS — R300 Dysuria: Secondary | ICD-10-CM

## 2016-12-06 DIAGNOSIS — E119 Type 2 diabetes mellitus without complications: Secondary | ICD-10-CM

## 2016-12-06 DIAGNOSIS — E1139 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: Secondary | ICD-10-CM

## 2016-12-06 DIAGNOSIS — R200 Anesthesia of skin: Secondary | ICD-10-CM

## 2016-12-06 DIAGNOSIS — M159 Polyosteoarthritis, unspecified: Secondary | ICD-10-CM

## 2016-12-06 DIAGNOSIS — L821 Other seborrheic keratosis: Secondary | ICD-10-CM | POA: Diagnosis not present

## 2016-12-06 DIAGNOSIS — I10 Essential (primary) hypertension: Secondary | ICD-10-CM | POA: Diagnosis not present

## 2016-12-06 DIAGNOSIS — M85859 Other specified disorders of bone density and structure, unspecified thigh: Secondary | ICD-10-CM

## 2016-12-06 DIAGNOSIS — Z87891 Personal history of nicotine dependence: Secondary | ICD-10-CM

## 2016-12-06 LAB — GLUCOSE, CAPILLARY: Glucose-Capillary: 123 mg/dL — ABNORMAL HIGH (ref 65–99)

## 2016-12-06 LAB — POCT GLYCOSYLATED HEMOGLOBIN (HGB A1C): Hemoglobin A1C: 6.4

## 2016-12-06 MED ORDER — TRAMADOL HCL 50 MG PO TABS: 50.0000 mg | ORAL_TABLET | Freq: Four times a day (QID) | ORAL | 3 refills | Status: DC | PRN

## 2016-12-06 NOTE — Assessment & Plan Note (Signed)
She is following with Dr. Katy Fitch.  He is concerned that she may need laser therapy.  Follow up planned.

## 2016-12-06 NOTE — Assessment & Plan Note (Signed)
BP was 133/62 today which was well controlled.  She reports compliance with her benazapril and hctz.  CMET will be done today to further evaluate renal function.   Plan Continue benazapril and hctz

## 2016-12-06 NOTE — Telephone Encounter (Signed)
Tramadol called in to [facility] pharmacy. 

## 2016-12-06 NOTE — Assessment & Plan Note (Signed)
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 She is on omeprazole.  No complaints today.   Plan Continue omeprazole.

## 2016-12-06 NOTE — Assessment & Plan Note (Signed)
DEXA showed T score of -2.4, very close to osteoporotic.  FRAX score is 2.7% for hip and 13% for any fracture.  She does not meet criteria for bisphosphonate therapy.   Consider DEXA in 2 years considering proximity to osteoporosis diagnosis Continue vitamin D.

## 2016-12-06 NOTE — Assessment & Plan Note (Signed)
A1C is 6.4 today on diet alone.  LDL is 82.  BP is well controlled.  She is following with Dr. Katy Fitch in ophthalmology.  Renal function will be assessed today, she is on an ACE-I.   Plan Continue to monitor, diet controlled.

## 2016-12-06 NOTE — Assessment & Plan Note (Signed)
Reports that this continues to come and go but is not debilitating.    Continue to monitor.

## 2016-12-06 NOTE — Progress Notes (Signed)
Subjective:    [name], female    DOB: 1951-01-05, 66 y.o.   MRN: [id number]  2 month follow up for HTN  HPI  Mariah Henderson is a 66yo woman with PMH of HTn, DM2, glaucoma who presents for follow up of her BP.    Today, Mariah Henderson's BP is 133/62 which is well controlled.  She is taking her benazapril and hctz without issue.  She does report darkened urine, some stinging and burning with urination.  Dipstick urine did not show any nitrites or leukocytes.  I think she may be getting mildly dehydrated with the hctz given she does not drink a lot of fluids regularly.   On last BMET check, she had a mildly elevated Na and mildly elevated K.  She is not having any chest pain or new issues which could be attributed to these.   She tells me that she is having pain in her right clavicle.  She had neurosurgery on her C spine a while ago and since that time, she feels her clavicular head has been enlarged and that she has developed pain there.  She is also having shoulder pain which is aching in nature.  She takes tramadol for this.  She does not have ROM issues.    Finally, we looked at the SK on her lateral left chest and it is unchanged, though she is considering having it removed.    Review of Systems  Constitutional: Negative for activity change, appetite change and fatigue.  Eyes: Positive for visual disturbance (seeing eye doctor for glaucoma).  Respiratory: Negative for cough and shortness of breath.   Cardiovascular: Negative for chest pain and leg swelling.  Gastrointestinal: Negative for constipation and diarrhea.  Genitourinary: Positive for dysuria. Negative for difficulty urinating, frequency, hematuria, vaginal discharge and vaginal pain.  Musculoskeletal: Positive for arthralgias. Negative for gait problem, joint swelling and neck pain.  Skin:       SK on chest  Neurological: Positive for dizziness. Negative for weakness, light-headedness and headaches.         Objective:   Physical Exam  Constitutional: She is oriented to person, place, and time. She appears well-developed and well-nourished.  HENT:  Head: Normocephalic and atraumatic.  Cardiovascular: Normal rate, regular rhythm, normal heart sounds and intact distal pulses.  No murmur heard. Pulmonary/Chest: Effort normal and breath sounds normal. No respiratory distress. She has no wheezes.  Abdominal: Soft. She exhibits no distension.  Musculoskeletal: She exhibits no edema or tenderness.  ROM and strength intact in right shoulder.  She has a protruding clavicle on that side with some tenderness at [facility] joint.    Neurological: She is alert and oriented to person, place, and time.  Skin: No rash noted. No erythema.  She has a 1cm X 1cm SK on left lateral chest under bra strap.  Color is uniform, heaped up appearance.  Appears unchanged from previous.   Psychiatric: She has a normal mood and affect. Her behavior is normal.  Vitals reviewed.   CMET today.       Assessment & Plan:  RTC in 2 months, sooner if needed.   Seb K of left chest She would like it removed, plan for shave removal in the new year.   Dysuria Spot urine only showed 30 protein and low bilirubin.  No infection or white cells or blood.  She is reporting dark urine and low fluid intake.  Will encourage fluid intake to increase and see if  this improves.  If not, will need pelvic exam at next visit.

## 2016-12-06 NOTE — Assessment & Plan Note (Signed)
Xray of the right clavicle and shoulder.  Possible post surgical changes vs. Arthritis.  She will get these after the holiday.   Plan Continue tramadol for pain Xrays pending.

## 2016-12-06 NOTE — Patient Instructions (Signed)
Ms. Doten - -  When you return from your Thanksgiving Holiday, please get Xrays of your shoulder and clavicle.   I will check some blood work today and call you if there is anything to be concerned with.   Thank you!  Please come back to see me in 3 months.

## 2016-12-07 LAB — CMP14 + ANION GAP
ALT: 15 IU/L (ref 0–32)
AST: 19 IU/L (ref 0–40)
Albumin/Globulin Ratio: 1.7 (ref 1.2–2.2)
Albumin: 4.3 g/dL (ref 3.6–4.8)
Alkaline Phosphatase: 130 IU/L — ABNORMAL HIGH (ref 39–117)
Anion Gap: 15 mmol/L (ref 10.0–18.0)
BUN/Creatinine Ratio: 13 (ref 12–28)
BUN: 11 mg/dL (ref 8–27)
Bilirubin Total: 0.4 mg/dL (ref 0.0–1.2)
CO2: 28 mmol/L (ref 20–29)
Calcium: 9.6 mg/dL (ref 8.7–10.3)
Chloride: 99 mmol/L (ref 96–106)
Creatinine, Ser: 0.86 mg/dL (ref 0.57–1.00)
GFR calc Af Amer: 80 mL/min/{1.73_m2} (ref >59)
GFR calc non Af Amer: 70 mL/min/{1.73_m2} (ref >59)
Globulin, Total: 2.5 g/dL (ref 1.5–4.5)
Glucose: 107 mg/dL — ABNORMAL HIGH (ref 65–99)
Potassium: 4.2 mmol/L (ref 3.5–5.2)
Sodium: 142 mmol/L (ref 134–144)
Total Protein: 6.8 g/dL (ref 6.0–8.5)

## 2016-12-11 ENCOUNTER — Telehealth: Payer: Self-pay | Admitting: Internal Medicine

## 2016-12-11 NOTE — Telephone Encounter (Signed)
Attempted to call Ms. Turrell to discuss blood work.  Left a hippa compliant message.   Please let me know if she calls back, I will talk with her.   Thanks!

## 2016-12-12 ENCOUNTER — Telehealth: Payer: Self-pay | Admitting: Internal Medicine

## 2016-12-12 NOTE — Telephone Encounter (Signed)
Patient is returning your call about her test results.  °

## 2016-12-12 NOTE — Telephone Encounter (Signed)
Attempted to call Mariah Henderson.  Left a hipaa compliant voicemail.    Glenda - -  If she calls back, can you let her know that her blood work was relatively normal and that her potassium was back to normal.    I can also talk to her if she prefers.   Thank you!

## 2016-12-13 DIAGNOSIS — R69 Illness, unspecified: Secondary | ICD-10-CM | POA: Diagnosis not present

## 2016-12-15 NOTE — Addendum Note (Signed)
Addended by: Orson Gear on: 12/15/2016 12:05 PM   Modules accepted: Orders

## 2017-01-01 ENCOUNTER — Ambulatory Visit (HOSPITAL_COMMUNITY)
Admission: RE | Admit: 2017-01-01 | Discharge: 2017-01-01 | Disposition: A | Discharge status: Home / Self Care | Payer: Medicare HMO | Source: Ambulatory Visit | Attending: Internal Medicine | Admitting: Internal Medicine

## 2017-01-01 DIAGNOSIS — M159 Polyosteoarthritis, unspecified: Secondary | ICD-10-CM

## 2017-01-01 DIAGNOSIS — M150 Primary generalized (osteo)arthritis: Secondary | ICD-10-CM | POA: Diagnosis present

## 2017-01-01 DIAGNOSIS — M8949 Other hypertrophic osteoarthropathy, multiple sites: Secondary | ICD-10-CM

## 2017-01-01 DIAGNOSIS — M19011 Primary osteoarthritis, right shoulder: Secondary | ICD-10-CM | POA: Diagnosis not present

## 2017-01-03 ENCOUNTER — Other Ambulatory Visit: Payer: Self-pay | Admitting: Internal Medicine

## 2017-01-03 DIAGNOSIS — I10 Essential (primary) hypertension: Secondary | ICD-10-CM

## 2017-01-17 ENCOUNTER — Telehealth: Payer: Self-pay | Admitting: Internal Medicine

## 2017-01-17 ENCOUNTER — Other Ambulatory Visit: Payer: Self-pay | Admitting: Internal Medicine

## 2017-01-17 DIAGNOSIS — M4722 Other spondylosis with radiculopathy, cervical region: Secondary | ICD-10-CM

## 2017-01-17 NOTE — Telephone Encounter (Signed)
Called Mariah Henderson to give her results of her Xrays.   She notes that she continues to have pain in her clavicle.  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  She has had this since her cervical neck surgery.  She is not happy with the results of this surgery and would like a second opinion.  She does not want to go back to her previous neurosurgeon or group.  She saw Dr. Hal Neer with [facility].  Unsure if there is another group in [place].  Will send to my referral specialists here in clinic.    Gilles Chiquito, MD

## 2017-01-20 ENCOUNTER — Other Ambulatory Visit: Payer: Self-pay | Admitting: Internal Medicine

## 2017-01-20 DIAGNOSIS — L988 Other specified disorders of the skin and subcutaneous tissue: Secondary | ICD-10-CM

## 2017-01-25 ENCOUNTER — Other Ambulatory Visit: Payer: Self-pay | Admitting: Internal Medicine

## 2017-01-25 DIAGNOSIS — R69 Illness, unspecified: Secondary | ICD-10-CM | POA: Diagnosis not present

## 2017-01-25 NOTE — Telephone Encounter (Signed)
Next appt scheduled  03/14/17 with PCP. 

## 2017-01-26 DIAGNOSIS — H20011 Primary iridocyclitis, right eye: Secondary | ICD-10-CM | POA: Diagnosis not present

## 2017-02-08 DIAGNOSIS — R69 Illness, unspecified: Secondary | ICD-10-CM | POA: Diagnosis not present

## 2017-03-02 ENCOUNTER — Other Ambulatory Visit: Payer: Self-pay | Admitting: Internal Medicine

## 2017-03-14 ENCOUNTER — Ambulatory Visit (INDEPENDENT_AMBULATORY_CARE_PROVIDER_SITE_OTHER): Payer: Medicare HMO | Admitting: Internal Medicine

## 2017-03-14 ENCOUNTER — Other Ambulatory Visit: Payer: Self-pay

## 2017-03-14 ENCOUNTER — Telehealth: Payer: Self-pay | Admitting: *Deleted

## 2017-03-14 ENCOUNTER — Encounter: Payer: Self-pay | Admitting: Internal Medicine

## 2017-03-14 VITALS — BP 145/76 | HR 72 | Temp 98.0°F | Ht 60.0 in | Wt 190.1 lb

## 2017-03-14 DIAGNOSIS — F325 Major depressive disorder, single episode, in full remission: Secondary | ICD-10-CM

## 2017-03-14 DIAGNOSIS — E669 Obesity, unspecified: Secondary | ICD-10-CM

## 2017-03-14 DIAGNOSIS — K219 Gastro-esophageal reflux disease without esophagitis: Secondary | ICD-10-CM | POA: Diagnosis not present

## 2017-03-14 DIAGNOSIS — E119 Type 2 diabetes mellitus without complications: Secondary | ICD-10-CM

## 2017-03-14 DIAGNOSIS — J45909 Unspecified asthma, uncomplicated: Secondary | ICD-10-CM

## 2017-03-14 DIAGNOSIS — Z6837 Body mass index (BMI) 37.0-37.9, adult: Secondary | ICD-10-CM | POA: Diagnosis not present

## 2017-03-14 DIAGNOSIS — Z7983 Long term (current) use of bisphosphonates: Secondary | ICD-10-CM | POA: Diagnosis not present

## 2017-03-14 DIAGNOSIS — M4722 Other spondylosis with radiculopathy, cervical region: Secondary | ICD-10-CM

## 2017-03-14 DIAGNOSIS — E7800 Pure hypercholesterolemia, unspecified: Secondary | ICD-10-CM

## 2017-03-14 DIAGNOSIS — I10 Essential (primary) hypertension: Secondary | ICD-10-CM

## 2017-03-14 DIAGNOSIS — F3340 Major depressive disorder, recurrent, in remission, unspecified: Secondary | ICD-10-CM | POA: Diagnosis not present

## 2017-03-14 DIAGNOSIS — Z79899 Other long term (current) drug therapy: Secondary | ICD-10-CM

## 2017-03-14 DIAGNOSIS — R69 Illness, unspecified: Secondary | ICD-10-CM | POA: Diagnosis not present

## 2017-03-14 DIAGNOSIS — M8580 Other specified disorders of bone density and structure, unspecified site: Secondary | ICD-10-CM

## 2017-03-14 DIAGNOSIS — M85859 Other specified disorders of bone density and structure, unspecified thigh: Secondary | ICD-10-CM

## 2017-03-14 LAB — POCT GLYCOSYLATED HEMOGLOBIN (HGB A1C): Hemoglobin A1C: 6.2

## 2017-03-14 LAB — GLUCOSE, CAPILLARY: Glucose-Capillary: 104 mg/dL — ABNORMAL HIGH (ref 65–99)

## 2017-03-14 MED ORDER — ALENDRONATE SODIUM 70 MG/75ML PO SOLN: 70.0000 mg | ORAL | 12 refills | Status: DC

## 2017-03-14 NOTE — Assessment & Plan Note (Signed)
Doing well on ranitidine, no acute complaints today.   Plan Continue ranitidine

## 2017-03-14 NOTE — Assessment & Plan Note (Signed)
She has not been exercising due to her pain and numbness related to her neck issues.  She knows that she should start back exercising now that the weather is good.   Plan  Exercise counseling, encouragement.

## 2017-03-14 NOTE — Patient Instructions (Addendum)
Ms. Lowder - -  Thank you for coming in today.  I will do some research on your clavicle issue and get back to you next week.   For your bones, please start taking Alendronate (fosamax) to help prevent a future fracture.  More information below.   Come back to see me in a few months.  Thanks!    Alendronate oral solution What is this medicine? ALENDRONATE (a LEN droe nate) slows calcium loss from bones. It helps to make normal healthy bone and to slow bone loss in people with Paget's disease and osteoporosis. It may be used in others at risk for bone loss. This medicine may be used for other purposes; ask your health care provider or pharmacist if you have questions. COMMON BRAND NAME(S): Fosamax What should I tell my health care provider before I take this medicine? They need to know if you have any of these conditions: -esophagus, stomach, or intestine problems, like acid reflux or GERD -dental disease -kidney disease -low blood calcium -low vitamin D -problems swallowing -problems sitting or standing for 30 minutes -an unusual or allergic reaction to alendronate, other medicines, foods, dyes, or preservatives -pregnant or trying to get pregnant -breast-feeding How should I use this medicine? You must take this medicine exactly as directed or you will lower the amount of the medicine you absorb into your body or you may cause yourself harm. Take your dose by mouth first thing in the morning, after you are up for the day. Do not eat or drink anything before you take this solution. Use a specially marked spoon or container to measure the oral solution. Take the solution with 2 fluid ounces (1/4 cup) of plain water. Do not take the solution with any other drink. After taking this medicine do not eat breakfast, drink, or take any other medicines or vitamins for at least 30 minutes. Stand or sit up for at least 30 minutes after you take this medicine; do not lie down. Do not take your medicine  more often than directed. Take this medicine on the same day every week. Talk to your pediatrician regarding the use of this medicine in children. Special care may be needed. Overdosage: If you think you have taken too much of this medicine contact a poison control center or emergency room at once. NOTE: This medicine is only for you. Do not share this medicine with others. What if I miss a dose? If you miss a dose, take the dose on the morning after you remember. Then take your next dose on your regular day of the week. Never take 2 doses on the same day. Do not take double or extra doses. What may interact with this medicine? -aluminum hydroxide -antacids -aspirin -calcium supplements -drugs for inflammation like ibuprofen, naproxen, and others -iron supplements -magnesium supplements -vitamins with minerals This list may not describe all possible interactions. Give your health care provider a list of all the medicines, herbs, non-prescription drugs, or dietary supplements you use. Also tell them if you smoke, drink alcohol, or use illegal drugs. Some items may interact with your medicine. What should I watch for while using this medicine? Visit your doctor for regular check ups. It may be some time before you see benefit from this medicine. Do not stop taking your medicine unless your doctor tells you to. Your doctor or health care professional may order regular blood tests to see how you are doing. You should make sure you get enough calcium and vitamin  D in your diet while you are taking this medicine, unless your doctor tells you not to. Discuss the foods you eat and the vitamins you take with your health care professional. Some people who take this medicine have severe bone, joint, and/or muscle pain. This medicine may also increase your risk for a broken thigh bone. Tell your doctor right away if you have pain in your upper leg or groin. Tell your doctor if you have any pain that does not  go away or that gets worse. This medicine can make you more sensitive to the sun. If you get a rash while taking this medicine, sunlight may cause the rash to get worse. Keep out of the sun. If you cannot avoid being in the sun, wear protective clothing and use sunscreen. Do not use sun lamps or tanning beds/booths. What side effects may I notice from receiving this medicine? Side effects that you should report to your doctor or health care professional as soon as possible: -allergic reactions like skin rash, itching or hives, swelling of the face, lips, or tongue -black or tarry stools -bone, muscle, or joint pain -changes in vision -chest pain -heartburn or stomach pain -jaw pain, especially after dental work -redness, blistering, peeling or loosening of the skin, including inside the mouth -trouble or pain when swallowing Side effects that usually do not require medical attention (report to your doctor or health care professional if they continue or are bothersome): -changes in taste -diarrhea or constipation -eye pain or itching -headache -nausea or vomiting -stomach gas or fullness This list may not describe all possible side effects. Call your doctor for medical advice about side effects. You may report side effects to FDA at 1-800-FDA-1088. Where should I keep my medicine? Keep out of the reach of children. Store at room temperature of 15 and 30 degrees C (59 and 86 degrees F). Throw away any unused medicine after the expiration date. NOTE: This sheet is a summary. It may not cover all possible information. If you have questions about this medicine, talk to your doctor, pharmacist, or health care provider.  2018 Elsevier/Gold Standard (2010-07-01 08:54:51)

## 2017-03-14 NOTE — Assessment & Plan Note (Signed)
BP today was mildly elevated at 145/76.  She is on hctz, lasix and benazapril with good results.  I asked her to take her medications prior to next visit.  BMET in November showed good renal function.   Plan Continue HCTZ, lasix and benazapril at current doses.

## 2017-03-14 NOTE — Assessment & Plan Note (Signed)
She is due to follow up with a new neurosurgeon b/c she does not feel that her surgery helped her.  She has a change to the sternoclavicular joint which she would like further worked up.  Upon doing some research today, it may be helpful to get a CT of the chest and next to further evaluate.  I will call and discuss with her.   Plan Follow up with neurosurgery CT of the clavicle.

## 2017-03-14 NOTE — Assessment & Plan Note (Signed)
She is on atorvastatin with good results.   Check lipid panel today  Plan Continue atorvastatin

## 2017-03-14 NOTE — Assessment & Plan Note (Signed)
She is following with a psychiatrist, she cannot remember his name at this time.   Plan Continue sertraline and trazodone and follow up with psychiatry.

## 2017-03-14 NOTE — Telephone Encounter (Signed)
Pt calls and states she has rechecked her BP since she got home and it was 122/64, she is reassured that her bp is within normal limits for her. She states she feels better, call ended

## 2017-03-14 NOTE — Progress Notes (Signed)
   Subjective:    [name], female    DOB: 01-Mar-1950, 67 y.o.   MRN: [id number]  CC: Follow up for DM and HTN  HPI   [name] is a pleasant 67yo woman with PMH Of DM2, HTN, GERD, reactive airway disease who presents for follow up of DM and HTN.    [name] is doing well today.  Her main complaint is clavicle enlargement and numbness/tingling of the right hand.  These have been chronic issues since her neck surgery in 2016.  She is being referred to a new neurosurgeon for a second opinion.  We did an xray of the clavicle back in December and it looked normal except with some mild arthritis.  She is most concerned with the cosmetic aspect and there is some mild tenderness at the site next to the sternum.    Otherwise, she notes that she is doing okay.  She has not taken her BP medications this morning, and her BP is mildly elevated.  We reviewed her last DEXA scan which was + for osteopenia and I informed her that her FRAX score was also elevated.  We discussed starting Fosamax.    She is following with Dr. Katy Fitch for glaucoma and doing well, no changes to vision.   Review of Systems  Constitutional: Positive for activity change (not exercising as much). Negative for fatigue.  Respiratory: Negative for cough and choking.   Cardiovascular: Negative for chest pain.  Neurological: Positive for numbness (right arm, hand). Negative for dizziness, syncope and weakness.  Psychiatric/Behavioral: Negative for dysphoric mood and hallucinations. The patient is not nervous/anxious.        Objective:   Physical Exam  Constitutional: She is oriented to person, place, and time. She appears well-developed and well-nourished.  HENT:  Head: Normocephalic and atraumatic.  Eyes: Conjunctivae are normal. No scleral icterus.  Cardiovascular: Normal rate, regular rhythm and normal heart sounds.  No murmur heard. Pulmonary/Chest: Effort normal. No respiratory distress.  Musculoskeletal:   She has a hardened bump at the site of where her clavicle would meet her sternum, + pain to palpation mild  Neurological: She is alert and oriented to person, place, and time.  Psychiatric: She has a normal mood and affect. Her behavior is normal.    Lipid panel today    Assessment & Plan:  RTC in 4 months, sooner if needed.

## 2017-03-14 NOTE — Assessment & Plan Note (Signed)
A1C today was very well controlled at 6.2.  She is diet controlled currently and doing well.  She has an LDL of 82 on atorvastatin.  I asked her to take her BP medications prior to next visit to get a better idea of HTN control.  She is due for an eye exam with Dr. Katy Fitch this year.    Plan Continue to monitor every 4 months or so Continue diet control Eye exam, reminded her to schedule

## 2017-03-14 NOTE — Assessment & Plan Note (Signed)
FRAX score today showed risk of major fracture at 20% and hip at 4.4 %.  Given this data, it is recommended to treat with anti-resorptive therapy.  We discussed bisphosphonate therapy today and she is amenable to starting.  We discussed the way to take the medication, weekly, sitting up for 1/2 hour and with a full glass of water.   Plan Start fosamax 70mg  weekly.

## 2017-03-15 LAB — LIPID PANEL
Chol/HDL Ratio: 2.5 ratio (ref 0.0–4.4)
Cholesterol, Total: 146 mg/dL (ref 100–199)
HDL: 59 mg/dL (ref >39)
LDL Calculated: 79 mg/dL (ref 0–99)
Triglycerides: 42 mg/dL (ref 0–149)
VLDL Cholesterol Cal: 8 mg/dL (ref 5–40)

## 2017-03-15 NOTE — Telephone Encounter (Signed)
That is good to hear.  Thank you Bonnita Nasuti!

## 2017-03-19 DIAGNOSIS — R69 Illness, unspecified: Secondary | ICD-10-CM | POA: Diagnosis not present

## 2017-03-20 ENCOUNTER — Telehealth: Payer: Self-pay | Admitting: *Deleted

## 2017-03-20 DIAGNOSIS — M25511 Pain in right shoulder: Secondary | ICD-10-CM

## 2017-03-20 NOTE — Telephone Encounter (Signed)
Attempted to call patient at 344pm.  Left a voicemail for her to call the clinic back.

## 2017-03-20 NOTE — Telephone Encounter (Signed)
Requesting something else for neck pain; states Tramadol helps a little. Pain worse when she's lying down. Thanks

## 2017-03-21 MED ORDER — ACETAMINOPHEN-CODEINE #3 300-30 MG PO TABS: 1.0000 | ORAL_TABLET | Freq: Three times a day (TID) | ORAL | 0 refills | Status: DC | PRN

## 2017-03-21 NOTE — Telephone Encounter (Signed)
Called Mariah Henderson to discuss her neck pain.  She continues to have pain at the [facility] joint on the right which is also the site of the enlargement.  She reports the pain interrupting her sleep and not being able to lie on her right side.  She would like further imaging to see what is going on.  I discussed with Radiology who recommended a limited scan of the chest, looking at the [facility] joint and the clavicle.  Mariah Henderson is amenable to this plan.  I will try a short course of Tylenol #3 at this time to help with the pain.  I advised her to only take at night.    Gilles Chiquito, MD

## 2017-03-21 NOTE — Telephone Encounter (Signed)
Patient returning a call. °

## 2017-04-10 ENCOUNTER — Ambulatory Visit (HOSPITAL_COMMUNITY)
Admission: RE | Admit: 2017-04-10 | Discharge: 2017-04-10 | Disposition: A | Discharge status: Home / Self Care | Payer: Medicare HMO | Source: Ambulatory Visit | Attending: Internal Medicine | Admitting: Internal Medicine

## 2017-04-10 DIAGNOSIS — M19011 Primary osteoarthritis, right shoulder: Secondary | ICD-10-CM | POA: Insufficient documentation

## 2017-04-10 DIAGNOSIS — R072 Precordial pain: Secondary | ICD-10-CM | POA: Diagnosis not present

## 2017-04-10 DIAGNOSIS — M25511 Pain in right shoulder: Secondary | ICD-10-CM | POA: Diagnosis not present

## 2017-04-23 ENCOUNTER — Other Ambulatory Visit: Payer: Self-pay | Admitting: Internal Medicine

## 2017-04-24 ENCOUNTER — Other Ambulatory Visit: Payer: Self-pay | Admitting: *Deleted

## 2017-04-24 DIAGNOSIS — J4540 Moderate persistent asthma, uncomplicated: Secondary | ICD-10-CM

## 2017-04-24 DIAGNOSIS — J302 Other seasonal allergic rhinitis: Secondary | ICD-10-CM

## 2017-04-24 MED ORDER — FLUTICASONE-SALMETEROL 100-50 MCG/DOSE IN AEPB: 1.0000 | INHALATION_SPRAY | Freq: Two times a day (BID) | RESPIRATORY_TRACT | 11 refills | Status: DC

## 2017-04-24 MED ORDER — ALBUTEROL SULFATE HFA 108 (90 BASE) MCG/ACT IN AERS: 2.0000 | INHALATION_SPRAY | Freq: Four times a day (QID) | RESPIRATORY_TRACT | 3 refills | Status: DC | PRN

## 2017-04-24 MED ORDER — ALBUTEROL SULFATE HFA 108 (90 BASE) MCG/ACT IN AERS: 1.0000 | INHALATION_SPRAY | Freq: Four times a day (QID) | RESPIRATORY_TRACT | 3 refills | Status: DC | PRN

## 2017-04-26 DIAGNOSIS — Z803 Family history of malignant neoplasm of breast: Secondary | ICD-10-CM | POA: Diagnosis not present

## 2017-04-26 DIAGNOSIS — Z1231 Encounter for screening mammogram for malignant neoplasm of breast: Secondary | ICD-10-CM | POA: Diagnosis not present

## 2017-05-01 DIAGNOSIS — R69 Illness, unspecified: Secondary | ICD-10-CM | POA: Diagnosis not present

## 2017-05-02 ENCOUNTER — Other Ambulatory Visit: Payer: Self-pay

## 2017-05-02 MED ORDER — FLUTICASONE PROPIONATE 50 MCG/ACT NA SUSP: 1.0000 | Freq: Every day | NASAL | 5 refills | Status: DC

## 2017-05-02 NOTE — Telephone Encounter (Signed)
fluticasone (FLONASE) 50 MCG/ACT nasal spray, Refill request @ [facility] on [address].

## 2017-05-15 ENCOUNTER — Telehealth: Payer: Self-pay | Admitting: *Deleted

## 2017-05-15 MED ORDER — ALENDRONATE SODIUM 70 MG PO TABS: 70.0000 mg | ORAL_TABLET | ORAL | 3 refills | Status: DC

## 2017-05-15 NOTE — Telephone Encounter (Signed)
Fax from [facility] - states liquid Alendronate 70 mg/75 ml is causing stomach upset and pt wants to be switched to tablets. Please send new rx Thanks

## 2017-05-29 DIAGNOSIS — R69 Illness, unspecified: Secondary | ICD-10-CM | POA: Diagnosis not present

## 2017-06-27 DIAGNOSIS — R69 Illness, unspecified: Secondary | ICD-10-CM | POA: Diagnosis not present

## 2017-07-14 ENCOUNTER — Other Ambulatory Visit: Payer: Self-pay | Admitting: Internal Medicine

## 2017-07-14 DIAGNOSIS — I10 Essential (primary) hypertension: Secondary | ICD-10-CM

## 2017-07-16 ENCOUNTER — Other Ambulatory Visit: Payer: Self-pay

## 2017-07-16 MED ORDER — BENAZEPRIL HCL 20 MG PO TABS
20.0000 mg | ORAL_TABLET | Freq: Every day | ORAL | 3 refills | Status: DC
Start: 2017-07-16 — End: 2018-07-09

## 2017-07-16 NOTE — Telephone Encounter (Signed)
HCTZ refill sent today. Last Rx for lasix sent 12/27/2015 with 11 refills. Will route request for benazepril L. Silvano Rusk, RN, BSN

## 2017-07-16 NOTE — Telephone Encounter (Signed)
Needs bp med to be filled, but do not remember the name. Please call pt back.

## 2017-07-18 ENCOUNTER — Ambulatory Visit (INDEPENDENT_AMBULATORY_CARE_PROVIDER_SITE_OTHER): Payer: Medicare HMO | Admitting: Internal Medicine

## 2017-07-18 ENCOUNTER — Encounter: Payer: Self-pay | Admitting: Internal Medicine

## 2017-07-18 ENCOUNTER — Other Ambulatory Visit (HOSPITAL_COMMUNITY)
Admission: RE | Admit: 2017-07-18 | Discharge: 2017-07-18 | Disposition: A | Discharge status: Home / Self Care | Payer: Medicare HMO | Source: Ambulatory Visit | Attending: Internal Medicine | Admitting: Internal Medicine

## 2017-07-18 ENCOUNTER — Other Ambulatory Visit: Payer: Self-pay

## 2017-07-18 VITALS — BP 122/73 | HR 77 | Temp 98.2°F | Wt 189.0 lb

## 2017-07-18 DIAGNOSIS — I10 Essential (primary) hypertension: Secondary | ICD-10-CM | POA: Diagnosis not present

## 2017-07-18 DIAGNOSIS — E785 Hyperlipidemia, unspecified: Secondary | ICD-10-CM | POA: Diagnosis not present

## 2017-07-18 DIAGNOSIS — R300 Dysuria: Secondary | ICD-10-CM | POA: Insufficient documentation

## 2017-07-18 DIAGNOSIS — E7800 Pure hypercholesterolemia, unspecified: Secondary | ICD-10-CM

## 2017-07-18 DIAGNOSIS — R42 Dizziness and giddiness: Secondary | ICD-10-CM | POA: Diagnosis not present

## 2017-07-18 DIAGNOSIS — M8580 Other specified disorders of bone density and structure, unspecified site: Secondary | ICD-10-CM | POA: Diagnosis not present

## 2017-07-18 DIAGNOSIS — Z79899 Other long term (current) drug therapy: Secondary | ICD-10-CM | POA: Diagnosis not present

## 2017-07-18 DIAGNOSIS — M4722 Other spondylosis with radiculopathy, cervical region: Secondary | ICD-10-CM | POA: Diagnosis not present

## 2017-07-18 DIAGNOSIS — M19011 Primary osteoarthritis, right shoulder: Secondary | ICD-10-CM

## 2017-07-18 DIAGNOSIS — E119 Type 2 diabetes mellitus without complications: Secondary | ICD-10-CM | POA: Diagnosis not present

## 2017-07-18 DIAGNOSIS — Z0000 Encounter for general adult medical examination without abnormal findings: Secondary | ICD-10-CM

## 2017-07-18 LAB — POCT URINALYSIS DIPSTICK
Bilirubin, UA: NEGATIVE
Blood, UA: NEGATIVE
Glucose, UA: NEGATIVE
Ketones, UA: NEGATIVE
Leukocytes, UA: NEGATIVE
Nitrite, UA: NEGATIVE
Protein, UA: NEGATIVE
Spec Grav, UA: >=1.03 — AB (ref 1.010–1.025)
Urobilinogen, UA: 0.2 E.U./dL
pH, UA: 5.5 (ref 5.0–8.0)

## 2017-07-18 LAB — GLUCOSE, CAPILLARY: Glucose-Capillary: 146 mg/dL — ABNORMAL HIGH (ref 70–99)

## 2017-07-18 LAB — POCT GLYCOSYLATED HEMOGLOBIN (HGB A1C): Hemoglobin A1C: 6.5 % — AB (ref 4.0–5.6)

## 2017-07-18 MED ORDER — TETANUS-DIPHTHERIA TOXOIDS TD 5-2 LFU IM INJ: 0.5000 mL | INJECTION | Freq: Once | INTRAMUSCULAR | 0 refills | Status: AC

## 2017-07-18 NOTE — Assessment & Plan Note (Signed)
BP today was 122/73.  She is doing well on current therapy of benazapril and hctz.  She has no complaints today, except for the dizziness.  Reviewed ENT note from [facility] last year which supposed that dizziness may be from orthostasis.  I do not know if she ever did a home BP log.  She is planning on following up with ENT in the near future.   For now, will continue current therapy. Renal function is stable at last check on this current therapy 6 months ago.   Plan Continue benazapril and hctz.

## 2017-07-18 NOTE — Patient Instructions (Signed)
[name] - -  It was a pleasure to see you!  Your A1C is 6.5 which is still controlled.    You are doing very well.  Please go to [facility] for Neurosurgery evaluation.  Please continue to take your pain medications as recommended.   Come back to see me in 4 months.  I will call you if we need to see  You sooner to do the vaginal exam.  I will call you with your results from today early next week.

## 2017-07-18 NOTE — Progress Notes (Signed)
   Subjective:    [name], female    DOB: 1950/08/02, 67 y.o.   MRN: [id number]  CC: 4 month follow up for DM and HTN  HPI   Mariah Henderson is a 67yo woman with PMH of osteopenia, HLD, sternoclavicular arthritis, HTN, DM2 who presents today for follow up of her HTN and DM.  She did have 2 acute issues as well.   She notes intermittent sensation of vertigo.  This comes and goes, worse when rising.  She has been seeing ENT here in town and reports that they gave her a medication for it, though she is not sure what.  She is planning on going to back to see them soon.    She also reports 2 weeks of dysuria.  She notes stinging and burning when she urinates.  She denies abdominal pain, fever, chills, hematuria, dark urine, other pains including back or CVA tenderness.  She has no constitutional symptoms.  She is worried about a UTI, however, dipstick UA was negative for leuks, protein, nitrite today.  She has had no new sexual partners and is not sexually active.  She only uses scent free toiletries and does not douche.  She has never had an STD and denies vaginal discharge or blood.  She denies skin changes.     Review of Systems  Constitutional: Negative for activity change and appetite change.       She reports that she is sleepy today  Respiratory: Negative for cough and shortness of breath.   Cardiovascular: Negative for chest pain and leg swelling.  Gastrointestinal: Negative for constipation and diarrhea.  Genitourinary: Positive for dysuria. Negative for difficulty urinating, dyspareunia, enuresis, flank pain, frequency, hematuria, pelvic pain, urgency, vaginal bleeding and vaginal pain.  Musculoskeletal: Positive for arthralgias and joint swelling (clavicle). Negative for back pain.  Neurological: Positive for light-headedness (occasional, sees ENT). Negative for weakness.       Objective:   Physical Exam  Constitutional: She is oriented to person, place, and time. She  appears well-developed and well-nourished. No distress.  HENT:  Head: Normocephalic and atraumatic.  Neck:  She has enlargement of the right clavicular head, CT chest at last visit shows arthritis in [place] joint.   Cardiovascular: Normal rate and regular rhythm.  Pulmonary/Chest: Effort normal and breath sounds normal.  Abdominal: She exhibits no distension. There is no tenderness.  Musculoskeletal: She exhibits deformity.  Neurological: She is alert and oriented to person, place, and time.  Psychiatric: She has a normal mood and affect. Her behavior is normal.  Nursing note and vitals reviewed.   A1C 6.5      Assessment & Plan:  RTC in 4 months, unless urinary studies normal, then will have her back in 4 weeks for pelvic exam.

## 2017-07-18 NOTE — Assessment & Plan Note (Signed)
A1C is 6.5 today, mildly increased from 6.5.  She is diet controlled.  BP is well controlled.  Last LDL was 79.  She brought in her BS log which I reviewed and she is well controlled.  No hypoglycemic episodes.   Will continue diet control and monitor A1C in 6 months.

## 2017-07-18 NOTE — Assessment & Plan Note (Signed)
LDL at last check was 79.  She is doing well on atorvastatin which will be continued.

## 2017-07-18 NOTE — Assessment & Plan Note (Signed)
Td due today, she has had DTP in the past.    Given Rx for Td at pharmacy.

## 2017-07-18 NOTE — Assessment & Plan Note (Signed)
She is due to see a new Neurosurgeon for a second opinion.  After this occurs, may consider referring her for evaluation of her [place] joint arthritis and possibly get injectable therapy.  She does not like the cosmetic appearance of the clavicle given then enlargement.  It is possible this is a manifestation of RA or another inflammatory arthritis.  Consider work up at next visit.  For now, follow up with NSG and continue conservative management.

## 2017-07-18 NOTE — Assessment & Plan Note (Signed)
Recently with pain/tingling with urination for 2 weeks.  UA non revealing.  Will check urine cytology for BV, candida and less likely trichomonas.  If all negative, will have her back in 2-3 weeks for vaginal exam to evaluate for vaginal dryness or vulvar issue as a cause.

## 2017-07-19 LAB — URINE CYTOLOGY ANCILLARY ONLY: Trichomonas: NEGATIVE

## 2017-07-23 LAB — URINE CYTOLOGY ANCILLARY ONLY
Bacterial vaginitis: NEGATIVE
Candida vaginitis: NEGATIVE

## 2017-07-24 ENCOUNTER — Other Ambulatory Visit: Payer: Self-pay | Admitting: Oncology

## 2017-07-27 ENCOUNTER — Telehealth: Payer: Self-pay | Admitting: Internal Medicine

## 2017-07-27 ENCOUNTER — Ambulatory Visit (INDEPENDENT_AMBULATORY_CARE_PROVIDER_SITE_OTHER): Payer: Medicare HMO | Admitting: Internal Medicine

## 2017-07-27 ENCOUNTER — Other Ambulatory Visit: Payer: Self-pay

## 2017-07-27 VITALS — BP 145/65 | HR 76 | Temp 99.1°F | Ht 60.0 in | Wt 193.1 lb

## 2017-07-27 DIAGNOSIS — M4722 Other spondylosis with radiculopathy, cervical region: Secondary | ICD-10-CM

## 2017-07-27 DIAGNOSIS — M7989 Other specified soft tissue disorders: Secondary | ICD-10-CM | POA: Diagnosis not present

## 2017-07-27 MED ORDER — ACETAMINOPHEN-CODEINE #3 300-30 MG PO TABS: 1.0000 | ORAL_TABLET | ORAL | 0 refills | Status: AC | PRN

## 2017-07-27 NOTE — Patient Instructions (Signed)
It was great meeting you today! I'm sorry you are having trouble with your arm. I am ordering an ultrasound of your arm which will look for blood clots. It could also be that you have not been using your arm and therefore accumulating fluid.   You have an appointment with neurosurgery later this month.   Please make an appointment to see Dr. Daryll Drown in 2-3 months.

## 2017-07-27 NOTE — Progress Notes (Signed)
   CC: Right neck and arm pain, new swelling  HPI:  Mariah Henderson is a 67 y.o. F with medical history as outlined below who presents with continued right neck and arm pain felt to be secondary to cervical radiculopathy and/or severe OA of R-sternoclavicular joint. She expressed concern today after noticing new swelling for the past few days.   For details regarding today's visit and the status of their chronic medical issues, please refer to the assessment and plan.  Past Medical History:  Diagnosis Date  . Acute medial meniscus tear    right knee  . Anxiety   . Asthma   . Cervical spondylosis   . Chronic serous otitis media   . Constipation   . COPD (chronic obstructive pulmonary disease) ([place])   . Depression    followed by [name]; no longer of depakote, seroquel  . Dysuria 12/27/2009   Qualifier: Diagnosis of  By: [name] MD, Amanjot    . Foot drop   . GERD (gastroesophageal reflux disease)   . Heart murmur   . Hyperlipidemia   . Hypertension   . Low back pain   . Obesity   . Paraumbilical hernia   . Pre-diabetes    Review of Systems:   General: Denies fevers, chills, weight loss, trauma HEENT: Denies acute changes in vision, sore throat, dysphagia Cardiac: Denies CP, SOB Abd: Denies abdominal pain, changes in bowels Extremities: Admits to right arm pain, limited ROM and swelling. No increased weakness. No increased numbness.   Physical Exam: General: Alert, in no acute distress. Pleasant and conversant but appears uncomfortable.  HEENT: No icterus or ptosis. No hoarseness or dysarthria  Cardiac: RRR, no MGR Pulmonary: CTA BL with normal WOB on RA. Able to speak in complete sentences Extremities: Warm, perfused. Patient with limitation in RUE ROM (pain). Fullness of right sternoclavicular joint. No rash. TTP of right axilla. Maybe mild swelling of RUE.   Vitals:   07/27/17 1006  BP: (!) 145/65  Pulse: 76  Temp: 99.1 F (37.3 C)  TempSrc: Oral  SpO2:  100%  Weight: 193 lb 1.6 oz (87.6 kg)  Height: 5' (1.524 m)   Body mass index is 37.71 kg/m.  Assessment & Plan:   See Encounters Tab for problem based charting.  Patient discussed with Dr. Rebeca Alert

## 2017-07-27 NOTE — Telephone Encounter (Signed)
I spoke with Ms. Kuehnel after her appointment in [facility] today.  She was not happy that her pain medication was not changed.  She notes that tramadol is not working for her pain.  She is very concerned that her pain is similar to the pain she had prior to her neck surgery.  She is on gabapentin only PRN at this time.  She would like to try something stronger for pain.  She is due to get an ultrasound and was informed about her appointment with her new neurosurgeon on 7/31.    Though she mentioned tylenol 3 not being helpful in Dr. Rober Minion note, she did note that she would like to try this medication with me.  I will send in tylenol 3 to be taken, 1-2 tabs up to 4 X per day for 3 days (15 tabs).  Hopefully she will get her ultrasound soon.    She will let me know if the tylenol 3 does not work for her and we can try something else in the acute setting.    Gilles Chiquito, MD

## 2017-07-27 NOTE — Assessment & Plan Note (Signed)
Assessment:  Patient here for evaluation of new RUE swelling over the past few days. She has essentially been immobilizing her RUE related to pain. No trauma, changes in pain or increased weakness. Right axilla quite TTP and had trace swelling of the extremity.     Plan: Will order doppler US to evaluate for possible DVT. Blood flow intact and she denied CP or SOB. Return precautions given.

## 2017-07-27 NOTE — Assessment & Plan Note (Signed)
Assessment & Plan:  Able to make some progress today with neurosurgery referral and now has an appointment at the end of the month. Discussed pain control and she feels Tramadol, Tylenol #3, Norco and Gabapentin are not helpful. Discussed possibly increasing dose of Gabapentin but patient declined, citing she was more worried about her arm swelling today than pain.

## 2017-07-30 NOTE — Progress Notes (Signed)
Internal Medicine Clinic Attending  Case discussed with Dr. Molt at the time of the visit.  We reviewed the resident's history and exam and pertinent patient test results.  I agree with the assessment, diagnosis, and plan of care documented in the resident's note.  Aiysha Jillson, M.D., Ph.D.  

## 2017-07-31 ENCOUNTER — Telehealth: Payer: Self-pay | Admitting: *Deleted

## 2017-07-31 NOTE — Telephone Encounter (Signed)
Call to [facility] to inform them that PA is not needed for Acetaminophen /Codiene #3.  Sander Nephew, RN 07/31/2017 3:29 PM.

## 2017-08-01 ENCOUNTER — Telehealth: Payer: Self-pay | Admitting: Internal Medicine

## 2017-08-01 DIAGNOSIS — M4722 Other spondylosis with radiculopathy, cervical region: Secondary | ICD-10-CM

## 2017-08-01 NOTE — Telephone Encounter (Signed)
Spoke with the patient.  She would like an order for a Sling if possible.  Ptunderstands that she should not keep her arm in it all the time and needs to move it around until her Appointment with [facility] so that it will not become stiff. Pt also  has the number to call their office to call to see if she could be put on a wait list to be seen sooner.

## 2017-08-02 NOTE — Telephone Encounter (Signed)
DME order placed.

## 2017-08-03 ENCOUNTER — Inpatient Hospital Stay (HOSPITAL_COMMUNITY): Admission: RE | Admit: 2017-08-03 | Payer: Medicare HMO | Source: Ambulatory Visit

## 2017-08-06 ENCOUNTER — Other Ambulatory Visit: Payer: Self-pay | Admitting: *Deleted

## 2017-08-06 DIAGNOSIS — R69 Illness, unspecified: Secondary | ICD-10-CM | POA: Diagnosis not present

## 2017-08-06 MED ORDER — GLUCOSE BLOOD VI STRP: ORAL_STRIP | 6 refills | Status: DC

## 2017-08-08 DIAGNOSIS — R69 Illness, unspecified: Secondary | ICD-10-CM | POA: Diagnosis not present

## 2017-08-09 NOTE — Telephone Encounter (Signed)
Order was faxed to the [facility] and the patient was aware.

## 2017-08-13 ENCOUNTER — Ambulatory Visit (HOSPITAL_COMMUNITY)
Admission: RE | Admit: 2017-08-13 | Discharge: 2017-08-13 | Disposition: A | Discharge status: Home / Self Care | Payer: Medicare HMO | Source: Ambulatory Visit | Attending: Internal Medicine | Admitting: Internal Medicine

## 2017-08-13 DIAGNOSIS — M7989 Other specified soft tissue disorders: Secondary | ICD-10-CM | POA: Diagnosis not present

## 2017-08-13 NOTE — Progress Notes (Signed)
RUE venous duplex prelim: negative for DVT and SVT. Skylan Gift Eunice, RDMS, RVT  

## 2017-08-15 ENCOUNTER — Telehealth: Payer: Self-pay | Admitting: Internal Medicine

## 2017-08-15 DIAGNOSIS — M47892 Other spondylosis, cervical region: Secondary | ICD-10-CM | POA: Diagnosis not present

## 2017-08-15 DIAGNOSIS — G8929 Other chronic pain: Secondary | ICD-10-CM | POA: Diagnosis not present

## 2017-08-15 DIAGNOSIS — M5412 Radiculopathy, cervical region: Secondary | ICD-10-CM | POA: Diagnosis not present

## 2017-08-15 DIAGNOSIS — R29898 Other symptoms and signs involving the musculoskeletal system: Secondary | ICD-10-CM | POA: Diagnosis not present

## 2017-08-15 DIAGNOSIS — M25511 Pain in right shoulder: Secondary | ICD-10-CM | POA: Diagnosis not present

## 2017-08-15 DIAGNOSIS — M47812 Spondylosis without myelopathy or radiculopathy, cervical region: Secondary | ICD-10-CM | POA: Diagnosis not present

## 2017-08-15 DIAGNOSIS — Z981 Arthrodesis status: Secondary | ICD-10-CM | POA: Diagnosis not present

## 2017-08-22 DIAGNOSIS — M5412 Radiculopathy, cervical region: Secondary | ICD-10-CM | POA: Diagnosis not present

## 2017-08-22 DIAGNOSIS — M6281 Muscle weakness (generalized): Secondary | ICD-10-CM | POA: Diagnosis not present

## 2017-08-22 DIAGNOSIS — M4322 Fusion of spine, cervical region: Secondary | ICD-10-CM | POA: Diagnosis not present

## 2017-08-22 DIAGNOSIS — M25611 Stiffness of right shoulder, not elsewhere classified: Secondary | ICD-10-CM | POA: Diagnosis not present

## 2017-08-23 DIAGNOSIS — E119 Type 2 diabetes mellitus without complications: Secondary | ICD-10-CM | POA: Diagnosis not present

## 2017-08-23 DIAGNOSIS — H25813 Combined forms of age-related cataract, bilateral: Secondary | ICD-10-CM | POA: Diagnosis not present

## 2017-08-23 DIAGNOSIS — H40013 Open angle with borderline findings, low risk, bilateral: Secondary | ICD-10-CM | POA: Diagnosis not present

## 2017-08-23 LAB — HM DIABETES EYE EXAM

## 2017-08-28 DIAGNOSIS — M4322 Fusion of spine, cervical region: Secondary | ICD-10-CM | POA: Diagnosis not present

## 2017-08-28 DIAGNOSIS — M25511 Pain in right shoulder: Secondary | ICD-10-CM | POA: Diagnosis not present

## 2017-08-28 DIAGNOSIS — M25611 Stiffness of right shoulder, not elsewhere classified: Secondary | ICD-10-CM | POA: Diagnosis not present

## 2017-08-28 DIAGNOSIS — M6281 Muscle weakness (generalized): Secondary | ICD-10-CM | POA: Diagnosis not present

## 2017-08-31 ENCOUNTER — Ambulatory Visit (INDEPENDENT_AMBULATORY_CARE_PROVIDER_SITE_OTHER): Payer: Medicare HMO | Admitting: Internal Medicine

## 2017-08-31 VITALS — BP 119/52 | HR 72 | Temp 98.1°F | Wt 187.8 lb

## 2017-08-31 DIAGNOSIS — R112 Nausea with vomiting, unspecified: Secondary | ICD-10-CM | POA: Diagnosis not present

## 2017-08-31 DIAGNOSIS — R42 Dizziness and giddiness: Secondary | ICD-10-CM | POA: Diagnosis not present

## 2017-08-31 DIAGNOSIS — R197 Diarrhea, unspecified: Secondary | ICD-10-CM

## 2017-08-31 DIAGNOSIS — Z79899 Other long term (current) drug therapy: Secondary | ICD-10-CM

## 2017-08-31 LAB — BASIC METABOLIC PANEL
Anion gap: 9 (ref 5–15)
BUN: 12 mg/dL (ref 8–23)
CO2: 28 mmol/L (ref 22–32)
Calcium: 9.2 mg/dL (ref 8.9–10.3)
Chloride: 103 mmol/L (ref 98–111)
Creatinine, Ser: 0.74 mg/dL (ref 0.44–1.00)
GFR calc Af Amer: >60 mL/min (ref >60)
GFR calc non Af Amer: >60 mL/min (ref >60)
Glucose, Bld: 144 mg/dL — ABNORMAL HIGH (ref 70–99)
Potassium: 3.9 mmol/L (ref 3.5–5.1)
Sodium: 140 mmol/L (ref 135–145)

## 2017-08-31 LAB — GLUCOSE, CAPILLARY: Glucose-Capillary: 136 mg/dL — ABNORMAL HIGH (ref 70–99)

## 2017-08-31 MED ORDER — LOPERAMIDE HCL 2 MG PO TABS: 2.0000 mg | ORAL_TABLET | Freq: Two times a day (BID) | ORAL | 0 refills | Status: AC | PRN

## 2017-08-31 MED ORDER — MECLIZINE HCL 12.5 MG PO TABS: 12.5000 mg | ORAL_TABLET | Freq: Two times a day (BID) | ORAL | 0 refills | Status: DC | PRN

## 2017-08-31 MED ORDER — ONDANSETRON HCL 4 MG PO TABS: 4.0000 mg | ORAL_TABLET | Freq: Three times a day (TID) | ORAL | 0 refills | Status: DC | PRN

## 2017-08-31 NOTE — Patient Instructions (Addendum)
Ms. Dress,   For your dizziness, take 1 tablet of meclizine as needed twice a day.  For your nausea you can take Zofran 1 tablet every 8 hours as needed.  Diarrhea please take Imodium or loperamide 1 tablet twice a day as needed.  Expect your nausea and diarrhea to resolve within the next 3 or 4 days.  Please let us know if they do not.  Please call us if you have any questions or concerns.  - Dr. Frederico Hamman

## 2017-09-02 ENCOUNTER — Encounter: Payer: Self-pay | Admitting: Internal Medicine

## 2017-09-02 DIAGNOSIS — R112 Nausea with vomiting, unspecified: Secondary | ICD-10-CM | POA: Insufficient documentation

## 2017-09-02 DIAGNOSIS — R197 Diarrhea, unspecified: Secondary | ICD-10-CM

## 2017-09-02 DIAGNOSIS — R42 Dizziness and giddiness: Secondary | ICD-10-CM | POA: Insufficient documentation

## 2017-09-02 NOTE — Assessment & Plan Note (Signed)
Diarrhea: Patient reports a 2 day history of diarrhea that started after eating a burger at a fast food restaurant. She describes is watery stools that occur about 5 times per day.  No melena or hematochezia.  She also endorses nausea, but no episodes of emesis.  Denies fevers, chills, and recent antibiotic use.  Abdominal exam is normal.  Suspect this is a foodborne illness that I expect to be self-limited. Low suspicion for C diff at this time. Will manage symptoms as below (last EKG reviewed, no history of QT prolongation).  - Zofran 4 mg TID PRN  - Imodium 2 mg BID PRN

## 2017-09-02 NOTE — Assessment & Plan Note (Signed)
Dizziness: Mariah Henderson presents for evaluation of dizziness that started 3 days ago. She describes episodes of the room spinning that last a few seconds and occur multiple times during the day. She has had BPPV in the past and states this episodes feel exactly the same. Denies tinnitus and hearing loss. Neuro exam reassuring. Initially concern for orthostatic hypotension in the setting of diarrhea, but orthostatic VS negative. Suspect this is likely BPPV. Prescribed meclizine as she has taken this in the past with good response. Also referred her to vestibular PT. - Meclizine 12.5 mg BID PRN  - Referral for vestibular PT  - Follow up as needed

## 2017-09-02 NOTE — Progress Notes (Signed)
   CC: Dizziness and diarrhea   HPI:  [name] is a 67 y.o. female with PMH listed below who presents to clinic for evaluation of dizziness and diarrhea.   Dizziness: Mariah Henderson Henderson presents for evaluation of dizziness that started 3 days ago. She describes episodes of the room spinning that last a few seconds and occur multiple times during the day. She has had BPPV in the past and states this episodes feel exactly the same. Denies tinnitus and hearing loss. Neuro exam reassuring. Initially concern for orthostatic hypotension in the setting of diarrhea, but orthostatic VS negative. Suspect this is likely BPPV. Prescribed meclizine as she has taken this in the past with good response. Also referred her to vestibular PT. - Meclizine 12.5 mg BID PRN  - Referral for vestibular PT  - Follow up as needed    Diarrhea: Patient reports a 2 day history of diarrhea that started after eating a burger at a fast food restaurant. She describes is watery stools that occur about 5 times per day.  No melena or hematochezia.  She also endorses nausea, but no episodes of emesis.  Denies fevers, chills, and recent antibiotic use.  Abdominal exam is normal.  Suspect this is a foodborne illness that I expect to be self-limited. Low suspicion for C diff at this time. Will manage symptoms as below (last EKG reviewed, no history of QT prolongation).  - Zofran 4 mg TID PRN  - Imodium 2 mg BID PRN   Past Medical History:  Diagnosis Date  . Acute medial meniscus tear    right knee  . Anxiety   . Asthma   . Cervical spondylosis   . Chronic serous otitis media   . Constipation   . COPD (chronic obstructive pulmonary disease) ([place])   . Depression    followed by [name]; no longer of depakote, seroquel  . Dysuria 12/27/2009   Qualifier: Diagnosis of  By: [name] MD, Amanjot    . Foot drop   . GERD (gastroesophageal reflux disease)   . Heart murmur   . Hyperlipidemia   . Hypertension   . Low back pain   .  Obesity   . Paraumbilical hernia   . Pre-diabetes    Review of Systems:   Review of Systems  Constitutional: Negative for chills, fever and malaise/fatigue.  Gastrointestinal: Positive for diarrhea and nausea. Negative for abdominal pain, blood in stool and melena.  Neurological: Positive for dizziness. Negative for sensory change, weakness and headaches.   Physical Exam: Vitals:   08/31/17 1522  BP: (!) 119/52  Pulse: 72  Temp: 98.1 F (36.7 C)  TempSrc: Oral  SpO2: 98%  Weight: 187 lb 12.8 oz (85.2 kg)   General: Elderly female well-appearing, in no acute distress CV: RRR, normal S1/S2, no murmurs, rubs, or gallops Pulmonary: CTAB, no wheezes or crackles, no increased work of breathing room air Abd: soft, NTND, hyperactive bowel sounds  Neuro: Alert and oriented x3, no nystagmus, no sensation deficits, strength intact in all 4 extremities   Assessment & Plan:   See Encounters Tab for problem based charting.  Patient discussed with Dr. Lynnae January

## 2017-09-03 ENCOUNTER — Encounter: Payer: Self-pay | Admitting: *Deleted

## 2017-09-03 NOTE — Progress Notes (Signed)
Internal Medicine Clinic Attending  Case discussed with Dr. Santos-Sanchez at the time of the visit.  We reviewed the resident's history and exam and pertinent patient test results.  I agree with the assessment, diagnosis, and plan of care documented in the resident's note.    

## 2017-09-11 DIAGNOSIS — M25611 Stiffness of right shoulder, not elsewhere classified: Secondary | ICD-10-CM | POA: Diagnosis not present

## 2017-09-11 DIAGNOSIS — M6281 Muscle weakness (generalized): Secondary | ICD-10-CM | POA: Diagnosis not present

## 2017-09-11 DIAGNOSIS — M4322 Fusion of spine, cervical region: Secondary | ICD-10-CM | POA: Diagnosis not present

## 2017-09-11 DIAGNOSIS — M25511 Pain in right shoulder: Secondary | ICD-10-CM | POA: Diagnosis not present

## 2017-09-26 DIAGNOSIS — G8929 Other chronic pain: Secondary | ICD-10-CM | POA: Diagnosis not present

## 2017-09-26 DIAGNOSIS — R69 Illness, unspecified: Secondary | ICD-10-CM | POA: Diagnosis not present

## 2017-09-26 DIAGNOSIS — M5412 Radiculopathy, cervical region: Secondary | ICD-10-CM | POA: Diagnosis not present

## 2017-09-26 DIAGNOSIS — R29898 Other symptoms and signs involving the musculoskeletal system: Secondary | ICD-10-CM | POA: Diagnosis not present

## 2017-09-26 DIAGNOSIS — M67911 Unspecified disorder of synovium and tendon, right shoulder: Secondary | ICD-10-CM | POA: Diagnosis not present

## 2017-09-26 DIAGNOSIS — M25511 Pain in right shoulder: Secondary | ICD-10-CM | POA: Diagnosis not present

## 2017-09-26 DIAGNOSIS — M4802 Spinal stenosis, cervical region: Secondary | ICD-10-CM | POA: Insufficient documentation

## 2017-09-26 DIAGNOSIS — Z981 Arthrodesis status: Secondary | ICD-10-CM | POA: Diagnosis not present

## 2017-10-01 ENCOUNTER — Other Ambulatory Visit: Payer: Self-pay | Admitting: Internal Medicine

## 2017-10-01 DIAGNOSIS — I10 Essential (primary) hypertension: Secondary | ICD-10-CM

## 2017-10-03 DIAGNOSIS — Z981 Arthrodesis status: Secondary | ICD-10-CM | POA: Diagnosis not present

## 2017-10-03 DIAGNOSIS — M4802 Spinal stenosis, cervical region: Secondary | ICD-10-CM | POA: Diagnosis not present

## 2017-10-03 DIAGNOSIS — M75121 Complete rotator cuff tear or rupture of right shoulder, not specified as traumatic: Secondary | ICD-10-CM | POA: Diagnosis not present

## 2017-10-10 ENCOUNTER — Encounter: Payer: Self-pay | Admitting: Internal Medicine

## 2017-10-10 ENCOUNTER — Other Ambulatory Visit: Payer: Self-pay

## 2017-10-10 ENCOUNTER — Ambulatory Visit (INDEPENDENT_AMBULATORY_CARE_PROVIDER_SITE_OTHER): Payer: Medicare HMO | Admitting: Internal Medicine

## 2017-10-10 VITALS — BP 127/69 | HR 72 | Temp 98.0°F | Ht 60.0 in | Wt 190.8 lb

## 2017-10-10 DIAGNOSIS — E1139 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: Secondary | ICD-10-CM

## 2017-10-10 DIAGNOSIS — F325 Major depressive disorder, single episode, in full remission: Secondary | ICD-10-CM

## 2017-10-10 DIAGNOSIS — H409 Unspecified glaucoma: Secondary | ICD-10-CM | POA: Diagnosis not present

## 2017-10-10 DIAGNOSIS — J301 Allergic rhinitis due to pollen: Secondary | ICD-10-CM

## 2017-10-10 DIAGNOSIS — H42 Glaucoma in diseases classified elsewhere: Secondary | ICD-10-CM

## 2017-10-10 DIAGNOSIS — M67911 Unspecified disorder of synovium and tendon, right shoulder: Secondary | ICD-10-CM

## 2017-10-10 DIAGNOSIS — M1990 Unspecified osteoarthritis, unspecified site: Secondary | ICD-10-CM

## 2017-10-10 DIAGNOSIS — Z79899 Other long term (current) drug therapy: Secondary | ICD-10-CM

## 2017-10-10 DIAGNOSIS — J45909 Unspecified asthma, uncomplicated: Secondary | ICD-10-CM

## 2017-10-10 DIAGNOSIS — J4520 Mild intermittent asthma, uncomplicated: Secondary | ICD-10-CM

## 2017-10-10 DIAGNOSIS — R69 Illness, unspecified: Secondary | ICD-10-CM | POA: Diagnosis not present

## 2017-10-10 DIAGNOSIS — Z23 Encounter for immunization: Secondary | ICD-10-CM

## 2017-10-10 DIAGNOSIS — I10 Essential (primary) hypertension: Secondary | ICD-10-CM

## 2017-10-10 LAB — GLUCOSE, CAPILLARY: Glucose-Capillary: 108 mg/dL — ABNORMAL HIGH (ref 70–99)

## 2017-10-10 MED ORDER — ONETOUCH ULTRA MINI W/DEVICE KIT: 1.0000 | PACK | Freq: Once | 0 refills | Status: AC

## 2017-10-10 NOTE — Progress Notes (Signed)
   Subjective:    [name], female    DOB: May 17, 1950, 67 y.o.   MRN: [id number]  CC: 3 month follow up for arm pain.  HPI  Mariah Henderson is a 67yo woman with PMH of HTN, DM, OA, glaucoma who presents for follow up.   Since being seen, she was seen for diarrhea and vertigo which have both resolved.  Her urinary symptoms from my last visit have also resolved.  She has seen Orthopedic Surgery at [facility] and MRI C spine and shoulder were done.  She is due to see them back on Monday.  She is mostly concerned with her clavicular head growth, and not her shoulder.  I advised her to discuss with her surgeon.    Her BP and diabetes are well controlled.  She has some intermittent lower extremity swelling which improves some with rest and worsens with activity and salty food.  She has no varicosities noted on her legs.  We did a medication review and added risperdal which she uses PRN for depression and eye drops from Dr. Zenia Resides office.    Review of Systems  Constitutional: Negative for activity change, appetite change and unexpected weight change.  Respiratory: Negative for cough and shortness of breath.   Cardiovascular: Positive for chest pain (she notes sharp chest pains which she thinks are gas, relieved by flatulence.  No radiation to arm or neck) and leg swelling. Negative for palpitations.  Gastrointestinal: Negative for abdominal distention, abdominal pain and diarrhea.  Genitourinary: Negative for difficulty urinating and dysuria.  Musculoskeletal: Positive for arthralgias, back pain, neck pain and neck stiffness.  Neurological: Negative for dizziness, weakness and headaches.       Objective:   Physical Exam  Constitutional: She is oriented to person, place, and time. She appears well-developed and well-nourished. No distress.  Cardiovascular: Normal rate and regular rhythm.  No murmur heard. Pulmonary/Chest: Effort normal and breath sounds normal. No respiratory  distress. She has no wheezes.  Musculoskeletal: She exhibits edema (non pitting to bilateral LE.  Left is slightly worse than right, no pain to palpation). She exhibits no tenderness or deformity.  She has swelling and tenderness of right shoulder.   Neurological: She is alert and oriented to person, place, and time.  Skin: Skin is warm and dry.  Psychiatric: She has a normal mood and affect. Her behavior is normal.  Nursing note and vitals reviewed.   No labs today.       Assessment & Plan:  RTC in 2-3 months, after seeing surgeon.

## 2017-10-10 NOTE — Patient Instructions (Signed)
Mariah Henderson - -  Thank you for coming in today.   I ordered a new meter for you.    Please see your orthopedic surgeon on Monday to make a plan for your shoulder and clavicle.   Please come back to see me in 2-3 months.

## 2017-10-11 ENCOUNTER — Encounter: Payer: Self-pay | Admitting: Internal Medicine

## 2017-10-11 NOTE — Assessment & Plan Note (Signed)
BP today was 127/69 which is well controlled.  She is taking benazapril, hctz.  BMET checked in August showed good renal function and normal potassium.   Plan Continue benazapril and hctz.

## 2017-10-11 NOTE — Assessment & Plan Note (Signed)
Doing well, no acute issues today.  She takes her inhaler or nebulizer when needed if she has issues.  This is a few times a month only.   Plan Continue inhaler (albuterol) and PRN nebulizer.

## 2017-10-11 NOTE — Assessment & Plan Note (Signed)
Following with Dr. Katy Fitch and on Xalatan based on chart review (she could not remember the name).  Vision is stable per her report today.

## 2017-10-11 NOTE — Assessment & Plan Note (Signed)
She is doing well with loratadine.  She does have some dry mouth which can be caused in 2-4% of patients taking this medication.  I advised her of some simple changes she could do, or spray saliva options.  If this becomes severe, would consider changing off of loratadine to see if it improves.  I did not see any wounds on the oral mucosa today.   Plan Continue loratadine.

## 2017-10-11 NOTE — Assessment & Plan Note (Signed)
She follows at the [facility] center.  She is on sertraline, trazodone and PRN risperdone.  She notes good control of her symptoms today. PHQ 2 was 1, related to acute shoulder issues.

## 2017-10-11 NOTE — Assessment & Plan Note (Signed)
Reviewed MRI with patient.  She has a full thickness rotator cuff tear.  She is due to see orthopedic surgery on Monday.  I advised her to discuss this issue and her clavicle enlargement with this physician.    Her pain is well managed today.    Follow up as planned with orthopedics.

## 2017-10-15 DIAGNOSIS — Z981 Arthrodesis status: Secondary | ICD-10-CM | POA: Diagnosis not present

## 2017-10-15 DIAGNOSIS — M75101 Unspecified rotator cuff tear or rupture of right shoulder, not specified as traumatic: Secondary | ICD-10-CM | POA: Diagnosis not present

## 2017-11-06 ENCOUNTER — Other Ambulatory Visit: Payer: Self-pay | Admitting: Student in an Organized Health Care Education/Training Program

## 2017-11-08 DIAGNOSIS — M75121 Complete rotator cuff tear or rupture of right shoulder, not specified as traumatic: Secondary | ICD-10-CM | POA: Diagnosis not present

## 2017-11-08 DIAGNOSIS — M25511 Pain in right shoulder: Secondary | ICD-10-CM | POA: Diagnosis not present

## 2017-11-08 DIAGNOSIS — M1388 Other specified arthritis, other site: Secondary | ICD-10-CM | POA: Diagnosis not present

## 2017-11-08 DIAGNOSIS — M19011 Primary osteoarthritis, right shoulder: Secondary | ICD-10-CM | POA: Insufficient documentation

## 2017-11-08 DIAGNOSIS — M75120 Complete rotator cuff tear or rupture of unspecified shoulder, not specified as traumatic: Secondary | ICD-10-CM | POA: Insufficient documentation

## 2017-11-12 ENCOUNTER — Other Ambulatory Visit: Payer: Self-pay | Admitting: Sports Medicine

## 2017-11-13 ENCOUNTER — Other Ambulatory Visit: Payer: Self-pay | Admitting: Internal Medicine

## 2017-11-13 ENCOUNTER — Other Ambulatory Visit: Payer: Self-pay | Admitting: Sports Medicine

## 2017-11-13 DIAGNOSIS — M1388 Other specified arthritis, other site: Secondary | ICD-10-CM

## 2017-11-13 NOTE — Telephone Encounter (Signed)
Refill Request  hydrochlorothiazide (MICROZIDE) 12.5 MG capsule  [id number] - [place], [address] AT [address] [place]

## 2017-11-13 NOTE — Telephone Encounter (Signed)
Called pharmacy spoke to pharmacist, pt just picked up 90 day supply 9/28, they will call her and see if they can help her, if not pharmacist will call clinic and ask for triage nurse

## 2017-11-16 DIAGNOSIS — R69 Illness, unspecified: Secondary | ICD-10-CM | POA: Diagnosis not present

## 2017-11-20 DIAGNOSIS — R69 Illness, unspecified: Secondary | ICD-10-CM | POA: Diagnosis not present

## 2017-11-22 ENCOUNTER — Ambulatory Visit
Admission: RE | Admit: 2017-11-22 | Discharge: 2017-11-22 | Disposition: A | Discharge status: Home / Self Care | Payer: Medicare HMO | Source: Ambulatory Visit | Attending: Sports Medicine | Admitting: Sports Medicine

## 2017-11-22 DIAGNOSIS — M1388 Other specified arthritis, other site: Secondary | ICD-10-CM

## 2017-11-22 DIAGNOSIS — J432 Centrilobular emphysema: Secondary | ICD-10-CM | POA: Diagnosis not present

## 2017-11-22 MED ORDER — IOPAMIDOL (ISOVUE-300) INJECTION 61%
75.0000 mL | Freq: Once | INTRAVENOUS | Status: AC | PRN
  Administered 2017-11-22: 75 mL via INTRAVENOUS

## 2017-12-12 ENCOUNTER — Ambulatory Visit (INDEPENDENT_AMBULATORY_CARE_PROVIDER_SITE_OTHER): Payer: Medicare HMO | Admitting: Internal Medicine

## 2017-12-12 ENCOUNTER — Encounter: Payer: Self-pay | Admitting: Internal Medicine

## 2017-12-12 VITALS — BP 133/68 | HR 76 | Temp 98.1°F | Ht 60.0 in | Wt 186.9 lb

## 2017-12-12 DIAGNOSIS — M5412 Radiculopathy, cervical region: Secondary | ICD-10-CM | POA: Diagnosis not present

## 2017-12-12 DIAGNOSIS — R1310 Dysphagia, unspecified: Secondary | ICD-10-CM

## 2017-12-12 DIAGNOSIS — M25511 Pain in right shoulder: Secondary | ICD-10-CM

## 2017-12-12 DIAGNOSIS — E119 Type 2 diabetes mellitus without complications: Secondary | ICD-10-CM

## 2017-12-12 DIAGNOSIS — Z79899 Other long term (current) drug therapy: Secondary | ICD-10-CM

## 2017-12-12 DIAGNOSIS — K219 Gastro-esophageal reflux disease without esophagitis: Secondary | ICD-10-CM

## 2017-12-12 DIAGNOSIS — M19011 Primary osteoarthritis, right shoulder: Secondary | ICD-10-CM

## 2017-12-12 DIAGNOSIS — R5382 Chronic fatigue, unspecified: Secondary | ICD-10-CM

## 2017-12-12 DIAGNOSIS — I10 Essential (primary) hypertension: Secondary | ICD-10-CM | POA: Diagnosis not present

## 2017-12-12 DIAGNOSIS — R1032 Left lower quadrant pain: Secondary | ICD-10-CM

## 2017-12-12 LAB — GLUCOSE, CAPILLARY: Glucose-Capillary: 147 mg/dL — ABNORMAL HIGH (ref 70–99)

## 2017-12-12 LAB — POCT GLYCOSYLATED HEMOGLOBIN (HGB A1C): Hemoglobin A1C: 6.6 % — AB (ref 4.0–5.6)

## 2017-12-12 NOTE — Assessment & Plan Note (Addendum)
She is taking her H2 blocker and reports no heartburn symptoms.  Her dysphagia might be related to another issue.    Plan Continue ranitidine

## 2017-12-12 NOTE — Assessment & Plan Note (Signed)
Seen by orthopedics, plans to see sports medicine.  Follow up on recommendations when completed.

## 2017-12-12 NOTE — Patient Instructions (Signed)
Mariah Henderson - -  Please continue taking all of your medications as prescribed.   Please get a swallowing study and schedule to see the speech therapists who work with swallowing issues.   Come back to see me in 2 months.   Thank you

## 2017-12-12 NOTE — Assessment & Plan Note (Addendum)
BP today was well controlled at 133/68.  She reports no headaches, chest pain.  She is taking her medications without issue, except occasionally feeling like they get stuck.   Plan Continue current medications

## 2017-12-12 NOTE — Assessment & Plan Note (Signed)
A1C is 6.6 today.  LDL checked this year is 54.   Continue diet control.  Continue atorvastatin.

## 2017-12-12 NOTE — Assessment & Plan Note (Addendum)
These symptoms are concerning.  Sounds like esophageal.  She is on medications that could possibly be harmful if they become stuck in her esophagus.  She could have narrowing due to GERD, achalasia, an esophageal ring or other pathology.   Plan I had advised her to get a barium swallow, however, on review of current recommendations, it appears that barium swallow before endoscopy is controversial.  Will plan to Refer to GI for possible endoscopy.  I will call patient and let her know.   BMET, CBC and iron/ferritin today.

## 2017-12-12 NOTE — Progress Notes (Signed)
   Subjective:    [name], female    DOB: 11-04-1950, 67 y.o.   MRN: [id number]  CC: 69 month follow up for shoulder pain.   HPI  Mariah Henderson is a pleasant 67yo woman with PMH of HTN, GERD, DM2, cervical radiculopathy, shoulder arthritis and others who presents for follow up.  She was recently seen by me in September.  She was having shoulder pain and found to have a shoulder muscle tear.  She went to see orthopedics who thought she would require a very extensive surgery and so referred her to sports medicine for non medical therapy.  She feels like she is doing okay.  Still has some limited motion of the shoulder on that side.  She is very concerned with her sternoclavicular joint enlargement.  She has been reassured by orthopedics that this is due to arthritis, but she does not like the aesthetic look of it.   She does report a new symptom to me today which is dysphagia.  She notes that solid foods have been getting stuck in her chest for the past few months.  She thought this was due to her cervical spine surgery and did not think much of it.  She has mainly problems with food that is hard to chew, like ruffage and salad.  She has started eating only softer foods to make it go down easier.  She occasionally gets a feeling of a pill being stuck as well.  She has to occasionally regurgitate food in order to get it unstuck.  She has no heart burn, no chest pain, no N/V or abdominal pain.  She has occasional gas pain and feeling like she needs to burp. S he has no issues with liquids and has never had endoscopy.  She does report occasional leakage of stool with flatulence, very rare but concerning to her.  She has very loose stools at baseline and often goes to the bathroom twice per day.  She does report occasional bloating.   Review of Systems  Constitutional: Positive for fatigue (chronic, but worsened). Negative for activity change, appetite change and fever.  Eyes: Negative for  photophobia and visual disturbance.  Respiratory: Negative for cough and shortness of breath.   Cardiovascular: Negative for chest pain.  Gastrointestinal: Negative for abdominal distention, abdominal pain, anal bleeding, blood in stool, constipation, nausea and vomiting.       Swallowing issues as noted in HPI  Musculoskeletal: Positive for arthralgias and joint swelling.  Neurological: Negative for dizziness and weakness.  Psychiatric/Behavioral: Negative for decreased concentration and dysphoric mood.       Objective:   Physical Exam  Constitutional: She is oriented to person, place, and time. She appears well-developed and well-nourished. No distress.  HENT:  Head: Normocephalic and atraumatic.  Mouth/Throat: Oropharynx is clear and moist. No oropharyngeal exudate.  Neck: Normal range of motion. Neck supple.  Cardiovascular: Normal rate and regular rhythm.  No murmur heard. Pulmonary/Chest: Effort normal and breath sounds normal. No respiratory distress.  Abdominal: Soft. Bowel sounds are normal. She exhibits no distension and no mass. There is tenderness (mild in LLQ). There is no rebound and no guarding.  Neurological: She is alert and oriented to person, place, and time.  Psychiatric: She has a normal mood and affect. Her behavior is normal.  Vitals reviewed.   BMET, CBC, iron, ferritin today.       Assessment & Plan:  RTC in 2 months.

## 2017-12-13 LAB — BMP8+ANION GAP
Anion Gap: 16 mmol/L (ref 10.0–18.0)
BUN/Creatinine Ratio: 15 (ref 12–28)
BUN: 13 mg/dL (ref 8–27)
CO2: 25 mmol/L (ref 20–29)
Calcium: 9.7 mg/dL (ref 8.7–10.3)
Chloride: 99 mmol/L (ref 96–106)
Creatinine, Ser: 0.86 mg/dL (ref 0.57–1.00)
GFR calc Af Amer: 80 mL/min/{1.73_m2} (ref >59)
GFR calc non Af Amer: 69 mL/min/{1.73_m2} (ref >59)
Glucose: 116 mg/dL — ABNORMAL HIGH (ref 65–99)
Potassium: 4.5 mmol/L (ref 3.5–5.2)
Sodium: 140 mmol/L (ref 134–144)

## 2017-12-13 LAB — CBC
Hematocrit: 41.9 % (ref 34.0–46.6)
Hemoglobin: 13.4 g/dL (ref 11.1–15.9)
MCH: 27.9 pg (ref 26.6–33.0)
MCHC: 32 g/dL (ref 31.5–35.7)
MCV: 87 fL (ref 79–97)
Platelets: 311 10*3/uL (ref 150–450)
RBC: 4.8 x10E6/uL (ref 3.77–5.28)
RDW: 12.2 % — ABNORMAL LOW (ref 12.3–15.4)
WBC: 7.3 10*3/uL (ref 3.4–10.8)

## 2017-12-13 LAB — IRON: Iron: 69 ug/dL (ref 27–139)

## 2017-12-13 LAB — FERRITIN: Ferritin: 152 ng/mL — ABNORMAL HIGH (ref 15–150)

## 2017-12-16 DIAGNOSIS — R69 Illness, unspecified: Secondary | ICD-10-CM | POA: Diagnosis not present

## 2017-12-20 DIAGNOSIS — M25552 Pain in left hip: Secondary | ICD-10-CM | POA: Diagnosis not present

## 2017-12-20 DIAGNOSIS — M75121 Complete rotator cuff tear or rupture of right shoulder, not specified as traumatic: Secondary | ICD-10-CM | POA: Diagnosis not present

## 2017-12-20 DIAGNOSIS — M25511 Pain in right shoulder: Secondary | ICD-10-CM | POA: Diagnosis not present

## 2017-12-20 DIAGNOSIS — M1388 Other specified arthritis, other site: Secondary | ICD-10-CM | POA: Diagnosis not present

## 2018-01-01 ENCOUNTER — Encounter: Payer: Self-pay | Admitting: *Deleted

## 2018-01-31 ENCOUNTER — Ambulatory Visit: Payer: Medicare HMO | Admitting: Gastroenterology

## 2018-01-31 DIAGNOSIS — R69 Illness, unspecified: Secondary | ICD-10-CM | POA: Diagnosis not present

## 2018-02-13 ENCOUNTER — Ambulatory Visit (INDEPENDENT_AMBULATORY_CARE_PROVIDER_SITE_OTHER): Payer: Medicare HMO | Admitting: Internal Medicine

## 2018-02-13 ENCOUNTER — Encounter: Payer: Self-pay | Admitting: Internal Medicine

## 2018-02-13 ENCOUNTER — Other Ambulatory Visit: Payer: Self-pay

## 2018-02-13 DIAGNOSIS — I10 Essential (primary) hypertension: Secondary | ICD-10-CM | POA: Diagnosis not present

## 2018-02-13 DIAGNOSIS — R1310 Dysphagia, unspecified: Secondary | ICD-10-CM | POA: Diagnosis not present

## 2018-02-13 DIAGNOSIS — E119 Type 2 diabetes mellitus without complications: Secondary | ICD-10-CM

## 2018-02-13 DIAGNOSIS — R42 Dizziness and giddiness: Secondary | ICD-10-CM

## 2018-02-13 DIAGNOSIS — Z79899 Other long term (current) drug therapy: Secondary | ICD-10-CM | POA: Diagnosis not present

## 2018-02-13 DIAGNOSIS — L853 Xerosis cutis: Secondary | ICD-10-CM | POA: Insufficient documentation

## 2018-02-13 LAB — GLUCOSE, CAPILLARY: Glucose-Capillary: 95 mg/dL (ref 70–99)

## 2018-02-13 MED ORDER — MECLIZINE HCL 12.5 MG PO TABS: 12.5000 mg | ORAL_TABLET | Freq: Two times a day (BID) | ORAL | 0 refills | Status: DC | PRN

## 2018-02-13 NOTE — Assessment & Plan Note (Signed)
She requests a refill of her meclizine, takes only PRN

## 2018-02-13 NOTE — Progress Notes (Signed)
Subjective:    [name], female    DOB: 1950-10-30, 68 y.o.   MRN: [id number]  CC: 3 month follow up for BP, dysphagia, DM2  HPI   Mariah Henderson is a 68 year old woman with PMH of DM2, HTN, GERD, OA, OP, HLD who presents for follow up.   Mariah Henderson has one acute complaint, which is a rash.  She notes that it is more itching than anything, on patches throughout her body, but worse on her right shoulder and back of her legs.  She scratches a lot and feels that there are small bumps there.  She has no pain at the areas.  She uses an avon lotion with scent on the areas and this doesn't seem to help.  She uses aveeno soap, will have worse itching and burning with regular soap.    She notes that she is a little down today because her weight is up and she knows that she has been eating poorly over the holidays.  We discussed ways for her to decrease her eating of potato chips, eating out and getting back to walking.  She feels that she can walk now.  She does have chronic shoulder pain for which she is due to have another procedure.  She is waiting until she is done with some other issues first however.   She continues to have dysphagia to solids.  She has set up an appointment with GI on 02/28/18.  She missed their first call b/c she was out of town.  She further is having small amount of stooling prior to going to the bathroom and with passing gas.  She will notice a small amount on her underwear.  This has been new during the same time as her dysphagia.  She had a colonoscopy in 2016 with polyps, but hyperplastic.  She is due to see Dr. Olevia Perches again.   She is requesting a refill on her meclizine.  She brought in her BS log and it looks good.  She is diet controlled.  Last A1C from November was 6.6.  Her BP is elevated today, she reports only taking one of her BP medications this morning.     Review of Systems  Constitutional: Positive for activity change. Negative for appetite change  and unexpected weight change.  Respiratory: Negative for cough and shortness of breath.   Cardiovascular: Negative for chest pain.  Gastrointestinal: Negative for abdominal pain, anal bleeding, blood in stool, diarrhea, rectal pain and vomiting.       Dysphagia  Skin: Positive for rash. Negative for color change and pallor.  Neurological: Positive for dizziness. Negative for weakness.  Psychiatric/Behavioral: Positive for dysphoric mood.       Objective:   Physical Exam Constitutional:      General: She is not in acute distress.    Appearance: She is not toxic-appearing.  HENT:     Head: Normocephalic and atraumatic.  Cardiovascular:     Pulses: Normal pulses.  Pulmonary:     Effort: Pulmonary effort is normal. No respiratory distress.  Musculoskeletal:        General: No swelling or tenderness.     Right lower leg: No edema.     Left lower leg: No edema.  Skin:    General: Skin is warm and dry.     Findings: No erythema or rash.     Comments: The areas of concern show dry skin and some scratch marks, no distinct rash  Neurological:     Mental Status: She is alert.    No blood work today.        Assessment & Plan:  RTC in 3 months.

## 2018-02-13 NOTE — Assessment & Plan Note (Signed)
Symptoms are persistent, but mildly improved today.  She has an appointment with GI on 2/11 and I advised her to follow up as planned.  She has no vomiting, abdominal pain, regurgitation, issues swallowing liquids or pills.

## 2018-02-13 NOTE — Patient Instructions (Addendum)
Mariah Henderson - -  For your dry skin - please switch to a scent free lotion, possibly with oatmeal in it.  Aveeno brand, or you can try cetaphil.  Please use twice per day.   For your swallowing issues, please keep your appointment with GI.  Please also tell them about your bowel movements.    I would recommend starting back walking and avoiding potato chips.  We discussed options for something crunchy instead.    Please come back to see me in 3-4 months, sooner if you need to.   Thank you!

## 2018-02-13 NOTE — Assessment & Plan Note (Signed)
We reviewed her BS log today.  Her last A1C was well controlled on diet alone.  She has gained some weight, so we talked about resuming exercise and lower carbohydrate diet.  She is willing to make these changes.  She is due for an A1C at next visit.

## 2018-02-13 NOTE — Assessment & Plan Note (Signed)
She does not have a distinct rash at this time.  I advised only using gentle, scent free soaps and changing to an oatmeal based lotion like Aveeno.  She could also use cetaphil or another product like this.  She expressed understanding.  If still an issue at next visit, will reassess.

## 2018-02-13 NOTE — Assessment & Plan Note (Signed)
BP today was elevated.  She notes that she ate a lot of salty food in the last 2 days and only took one of her BP medications.  She is very committed to going back on a low salt, low carb diet as she knows she has gained weight and she is not feeling as well as she was.  I advised her to take her other BP medication at home.  She has a BP cuff and will check and call the clinic if still elevated.   Plan Continue current therapy - benazapril, hctz Check BP at home weekly Discussed low salt diet

## 2018-02-22 ENCOUNTER — Other Ambulatory Visit: Payer: Self-pay | Admitting: Internal Medicine

## 2018-02-22 ENCOUNTER — Ambulatory Visit (INDEPENDENT_AMBULATORY_CARE_PROVIDER_SITE_OTHER): Payer: Medicare HMO | Admitting: Internal Medicine

## 2018-02-22 ENCOUNTER — Other Ambulatory Visit: Payer: Self-pay

## 2018-02-22 ENCOUNTER — Encounter: Payer: Self-pay | Admitting: Internal Medicine

## 2018-02-22 VITALS — BP 141/71 | HR 87 | Temp 99.6°F | Ht 61.0 in | Wt 190.5 lb

## 2018-02-22 DIAGNOSIS — R69 Illness, unspecified: Secondary | ICD-10-CM | POA: Diagnosis not present

## 2018-02-22 DIAGNOSIS — J069 Acute upper respiratory infection, unspecified: Secondary | ICD-10-CM

## 2018-02-22 DIAGNOSIS — J4520 Mild intermittent asthma, uncomplicated: Secondary | ICD-10-CM

## 2018-02-22 MED ORDER — ALBUTEROL SULFATE (2.5 MG/3ML) 0.083% IN NEBU: 2.5000 mg | INHALATION_SOLUTION | Freq: Four times a day (QID) | RESPIRATORY_TRACT | 3 refills | Status: DC | PRN

## 2018-02-22 MED ORDER — GUAIFENESIN ER 600 MG PO TB12: 600.0000 mg | ORAL_TABLET | Freq: Two times a day (BID) | ORAL | 2 refills | Status: AC

## 2018-02-22 NOTE — Telephone Encounter (Signed)
Pt called back, she has changed her ph#. appt [facility] 2/7 at 1315

## 2018-02-22 NOTE — Patient Instructions (Addendum)
[name],  It was a pleasure to meet you. I am sorry to hear you have been sick. For your cough and congestion, you can take mucinex every 12 hours as needed. Please use your albuterol nebulizers as needed for wheezing and chest tight.   Irrigating your sinuses with saline may help as well. You can buy a netti pot over the counter at your pharmacy. Use your flonase after saline use.    Please follow up with [place] again if your symptoms worsen or you do not improve in the next 1-2 weeks.  If you have any questions or concerns, call our clinic at [phone] or after hours call [phone] and ask for the internal medicine resident on call. Thank you!  Dr. Philipp Ovens

## 2018-02-22 NOTE — Progress Notes (Signed)
   CC: cough and congestion  HPI:  [name] is a 68 y.o. female with past medical history outlined below here for cough and congestion. For the details of today's visit, please refer to the assessment and plan.  Past Medical History:  Diagnosis Date  . Acute medial meniscus tear    right knee  . Anxiety   . Asthma   . Cervical spondylosis   . Chronic serous otitis media   . Constipation   . COPD (chronic obstructive pulmonary disease) ([place])   . Depression    followed by [name]; no longer of depakote, seroquel  . Dysuria 12/27/2009   Qualifier: Diagnosis of  By: [name] MD, Amanjot    . Foot drop   . GERD (gastroesophageal reflux disease)   . Heart murmur   . Hyperlipidemia   . Hypertension   . Low back pain   . Obesity   . Paraumbilical hernia   . Pre-diabetes     Review of Systems  HENT: Positive for congestion.   Respiratory: Positive for cough.     Physical Exam:  Vitals:   02/22/18 1318  BP: (!) 141/71  Pulse: 87  Temp: 99.6 F (37.6 C)  TempSrc: Oral  SpO2: 96%  Weight: 190 lb 8 oz (86.4 kg)  Height: 5\' 1"  (1.549 m)    Constitutional: NAD, appears comfortable Cardiovascular: RRR, no m/r/g Pulmonary/Chest: CTAB, no wheezes, rales, or rhonchi.  Extremities: Warm and well perfused. No edema.  Psychiatric: Normal mood and affect  Assessment & Plan:   See Encounters Tab for problem based charting.  Patient discussed with Dr. Dareen Piano

## 2018-02-22 NOTE — Assessment & Plan Note (Signed)
Patient is here with complaint of cough and congestion x1 week.  She denies fevers and chills.  She endorses a tightness in her chest but denies shortness of breath.  She has a history of reactive airway disease and has been using her albuterol as needed.  On exam today her lungs are clear without wheezing.  She has nasal and upper airway congestion.  Discussed that her symptoms are likely viral in nature.  Advised supportive treatment with Mucinex, sinus irrigation, and continued albuterol nebulizers as needed.  Provided return precautions if symptoms were to worsen. --Mucinex every 12 hours as needed - Advised sinus irrigation with Nettie pot or sterile saline --Continue Flonase -Continue albuterol nebulizers, provided refills -Follow-up as needed

## 2018-02-22 NOTE — Telephone Encounter (Signed)
Pt would like some medicine to for a cold, pls contact her ASAP, she needs something [phone]

## 2018-02-22 NOTE — Telephone Encounter (Signed)
Attempted to call patient at [phone], received message "Call cannot be completed at this time, please try your call again later".  Will try to reach pt later this a.m.. SChaplin, RN,BSN

## 2018-02-25 ENCOUNTER — Telehealth: Payer: Self-pay | Admitting: Internal Medicine

## 2018-02-25 ENCOUNTER — Telehealth: Payer: Self-pay | Admitting: *Deleted

## 2018-02-25 ENCOUNTER — Telehealth: Payer: Self-pay

## 2018-02-25 NOTE — Telephone Encounter (Signed)
This is being addressed in separate phone encounter from today. L. Lavance Beazer, RN, BSN   

## 2018-02-25 NOTE — Telephone Encounter (Signed)
She can take a decongestant.  I would recommend Coricidin brand with a decongestant in it.

## 2018-02-25 NOTE — Telephone Encounter (Signed)
error 

## 2018-02-25 NOTE — Telephone Encounter (Signed)
Pt is requesting a new nebulizer machine, she would like a call today please. [phone].

## 2018-02-25 NOTE — Telephone Encounter (Signed)
Information given to patient. She states she thinks she threw out her nebulizer machine and is requesting another one. She is ok with using [facility]. Hubbard Hartshorn, RN, BSN

## 2018-02-25 NOTE — Progress Notes (Signed)
Internal Medicine Clinic Attending  Case discussed with Dr. Guilloud at the time of the visit.  We reviewed the resident's history and exam and pertinent patient test results.  I agree with the assessment, diagnosis, and plan of care documented in the resident's note.  

## 2018-02-25 NOTE — Telephone Encounter (Signed)
Call from pt - stated she was seen on Friday; prescribed Mucinex which is helping somewhat but stated she needs something else "for this heaviness in my chest". Coughing up greenish-yellow sputum. And after a coughing spell, she becomes short of breath (which happened while on the phone).Thanks

## 2018-02-26 NOTE — Telephone Encounter (Signed)
Okay, I hadn't done it yet.  Thank you

## 2018-02-26 NOTE — Telephone Encounter (Signed)
Pt is calling to cancel the order for the nebulizer

## 2018-03-06 ENCOUNTER — Ambulatory Visit: Payer: Medicare HMO | Admitting: Gastroenterology

## 2018-03-08 ENCOUNTER — Ambulatory Visit: Payer: Medicare HMO | Admitting: Gastroenterology

## 2018-03-12 ENCOUNTER — Other Ambulatory Visit: Payer: Self-pay | Admitting: Internal Medicine

## 2018-03-19 DIAGNOSIS — R69 Illness, unspecified: Secondary | ICD-10-CM | POA: Diagnosis not present

## 2018-03-22 ENCOUNTER — Other Ambulatory Visit: Payer: Self-pay | Admitting: *Deleted

## 2018-03-22 MED ORDER — ALBUTEROL SULFATE HFA 108 (90 BASE) MCG/ACT IN AERS: 1.0000 | INHALATION_SPRAY | Freq: Four times a day (QID) | RESPIRATORY_TRACT | 1 refills | Status: DC | PRN

## 2018-04-22 ENCOUNTER — Other Ambulatory Visit: Payer: Self-pay | Admitting: *Deleted

## 2018-04-22 MED ORDER — ALENDRONATE SODIUM 70 MG PO TABS: 70.0000 mg | ORAL_TABLET | ORAL | 3 refills | Status: DC

## 2018-05-09 DIAGNOSIS — R69 Illness, unspecified: Secondary | ICD-10-CM | POA: Diagnosis not present

## 2018-05-23 DIAGNOSIS — Z20828 Contact with and (suspected) exposure to other viral communicable diseases: Secondary | ICD-10-CM | POA: Diagnosis not present

## 2018-05-25 ENCOUNTER — Other Ambulatory Visit: Payer: Self-pay | Admitting: Internal Medicine

## 2018-05-25 DIAGNOSIS — R42 Dizziness and giddiness: Secondary | ICD-10-CM

## 2018-05-28 DIAGNOSIS — K219 Gastro-esophageal reflux disease without esophagitis: Secondary | ICD-10-CM | POA: Diagnosis not present

## 2018-05-28 DIAGNOSIS — R42 Dizziness and giddiness: Secondary | ICD-10-CM | POA: Diagnosis not present

## 2018-05-29 ENCOUNTER — Other Ambulatory Visit: Payer: Self-pay

## 2018-05-29 ENCOUNTER — Ambulatory Visit (INDEPENDENT_AMBULATORY_CARE_PROVIDER_SITE_OTHER): Payer: Medicare HMO | Admitting: Internal Medicine

## 2018-05-29 DIAGNOSIS — G8929 Other chronic pain: Secondary | ICD-10-CM | POA: Diagnosis not present

## 2018-05-29 DIAGNOSIS — I10 Essential (primary) hypertension: Secondary | ICD-10-CM | POA: Diagnosis not present

## 2018-05-29 DIAGNOSIS — M25511 Pain in right shoulder: Secondary | ICD-10-CM | POA: Diagnosis not present

## 2018-05-29 DIAGNOSIS — R1310 Dysphagia, unspecified: Secondary | ICD-10-CM

## 2018-05-29 DIAGNOSIS — R42 Dizziness and giddiness: Secondary | ICD-10-CM

## 2018-05-29 DIAGNOSIS — Z79899 Other long term (current) drug therapy: Secondary | ICD-10-CM | POA: Diagnosis not present

## 2018-05-29 NOTE — Progress Notes (Signed)
  [facility] Internal Medicine Residency Telephone Encounter Continuity Care Appointment  HPI:   This telephone encounter was created for [name] on 05/29/2018 for the following purpose/cc Follow up of chronic issues including HTN, shoulder pain.  [name] is a pleasant 68 year old woman.  She reports that she has been doing relatively well.  She has some chronic issues which we discussed today and one acute on chronic issue.   She notes that she continues to have shoulder and neck pain, particularly sternoclavicular pain.  She saw her neurosurgeon who felt it was mostly an orthopedic issue.  She saw the orthopedist who noted that this was likely related to arthritis and continued exercise and pain management was the correct modality to help.  Unfortunately, she cannot go to PT at this time due to [place] pandemic.    She has chronic dysphagia and I had set her up to see GI for assessment of esophageal dysphagia.  Her symptoms are not changed, but she notes that she was not able to schedule that visit.  She would like to try and go now as her symptoms have not improved.    She notes that she has been having worsening vertigo.  She went to see her ENT doctor and was prescribed a new course of meclizine.  She has been referred to [facility] for more specialized testing.  She feels the meclizine is working okay but could be better.    Past Medical History:  Past Medical History:  Diagnosis Date  . Acute medial meniscus tear    right knee  . Anxiety   . Asthma   . Cervical spondylosis   . Chronic serous otitis media   . Constipation   . COPD (chronic obstructive pulmonary disease) ([place])   . Depression    followed by [name]; no longer of depakote, seroquel  . Dysuria 12/27/2009   Qualifier: Diagnosis of  By: [name] MD, Amanjot    . Foot drop   . GERD (gastroesophageal reflux disease)   . Heart murmur   . Hyperlipidemia   . Hypertension   . Low back pain   . Obesity   .  Paraumbilical hernia   . Pre-diabetes       ROS:   As per HPI.  No cough, runny nose, fever, chest pain.    Assessment / Plan / Recommendations:   Please see A&P under problem oriented charting for assessment of the patient's acute and chronic medical conditions.   As always, pt is advised that if symptoms worsen or new symptoms arise, they should go to an urgent care facility or to to ER for further evaluation.   Consent and Medical Decision Making:   This is a telephone encounter between Mariah Henderson and Gilles Chiquito on 05/29/2018 for follow up of chronic medical issues. The visit was conducted with the patient located at home and Gilles Chiquito at [facility]. The patient's identity was confirmed using their DOB and current address. The patient has consented to being evaluated through a telephone encounter and understands the associated risks (an examination cannot be done and the patient may need to come in for an appointment) / benefits (allows the patient to remain at home, decreasing exposure to coronavirus). I personally spent 15 minutes on medical discussion.    Follow up in 1-2 months in person if possible.

## 2018-05-31 NOTE — Assessment & Plan Note (Signed)
She reports that her blood pressure is doing okay.  She has no complaints of headache or chest pain.  She will continue hctz and benazepril.

## 2018-05-31 NOTE — Assessment & Plan Note (Signed)
She notes that this continues to be an issue, but not overly bothersome.  She will continue to exercise to keep the shoulder mobile and possible go to PT when outpatient PT is able to accommodate her.

## 2018-05-31 NOTE — Assessment & Plan Note (Signed)
Severely worsened.  Seen by ENT.  Plan per that team.  Continue meclizine as needed

## 2018-05-31 NOTE — Assessment & Plan Note (Signed)
Will plan to re-refer to GI.  She continues to have symptoms but they have not worsened.

## 2018-05-31 NOTE — Patient Instructions (Signed)
Patient instructions via phone.

## 2018-06-22 DIAGNOSIS — R69 Illness, unspecified: Secondary | ICD-10-CM | POA: Diagnosis not present

## 2018-06-26 DIAGNOSIS — R69 Illness, unspecified: Secondary | ICD-10-CM | POA: Diagnosis not present

## 2018-06-27 DIAGNOSIS — M25511 Pain in right shoulder: Secondary | ICD-10-CM | POA: Diagnosis not present

## 2018-06-27 DIAGNOSIS — G8929 Other chronic pain: Secondary | ICD-10-CM | POA: Diagnosis not present

## 2018-06-27 DIAGNOSIS — Z981 Arthrodesis status: Secondary | ICD-10-CM | POA: Diagnosis not present

## 2018-06-27 DIAGNOSIS — M5412 Radiculopathy, cervical region: Secondary | ICD-10-CM | POA: Diagnosis not present

## 2018-06-27 DIAGNOSIS — R29898 Other symptoms and signs involving the musculoskeletal system: Secondary | ICD-10-CM | POA: Diagnosis not present

## 2018-07-01 ENCOUNTER — Telehealth: Payer: Self-pay | Admitting: *Deleted

## 2018-07-01 ENCOUNTER — Other Ambulatory Visit: Payer: Self-pay | Admitting: Internal Medicine

## 2018-07-01 DIAGNOSIS — R42 Dizziness and giddiness: Secondary | ICD-10-CM

## 2018-07-01 MED ORDER — MECLIZINE HCL 12.5 MG PO TABS: ORAL_TABLET | ORAL | 0 refills | Status: DC

## 2018-07-01 NOTE — Progress Notes (Signed)
I sent in her medicine, Meclizine.  Thanks

## 2018-07-01 NOTE — Telephone Encounter (Signed)
Please call patient as Dr. Daryll Drown forgot to call in a medicine for dizziness. She did not have the name.

## 2018-07-01 NOTE — Telephone Encounter (Signed)
I sent in Meclizine. Thanks!

## 2018-07-08 ENCOUNTER — Other Ambulatory Visit: Payer: Self-pay | Admitting: Internal Medicine

## 2018-07-18 DIAGNOSIS — G8929 Other chronic pain: Secondary | ICD-10-CM | POA: Diagnosis not present

## 2018-07-18 DIAGNOSIS — M5412 Radiculopathy, cervical region: Secondary | ICD-10-CM | POA: Diagnosis not present

## 2018-07-18 DIAGNOSIS — Z981 Arthrodesis status: Secondary | ICD-10-CM | POA: Diagnosis not present

## 2018-07-18 DIAGNOSIS — M25511 Pain in right shoulder: Secondary | ICD-10-CM | POA: Diagnosis not present

## 2018-07-18 DIAGNOSIS — R29898 Other symptoms and signs involving the musculoskeletal system: Secondary | ICD-10-CM | POA: Diagnosis not present

## 2018-07-23 ENCOUNTER — Ambulatory Visit: Payer: Medicare HMO

## 2018-07-25 ENCOUNTER — Encounter: Payer: Self-pay | Admitting: *Deleted

## 2018-08-04 ENCOUNTER — Other Ambulatory Visit: Payer: Self-pay | Admitting: Internal Medicine

## 2018-08-05 DIAGNOSIS — R69 Illness, unspecified: Secondary | ICD-10-CM | POA: Diagnosis not present

## 2018-08-05 NOTE — Telephone Encounter (Signed)
Next appt scheduled 9/18 with PCP. 

## 2018-08-05 NOTE — Telephone Encounter (Signed)
glucose blood (ONE TOUCH ULTRA TEST) test strip, refill request @  [facility] [phone] - [place], [address] AT [address] [phone] (Phone) [phone] (Fax)

## 2018-08-08 DIAGNOSIS — Z90710 Acquired absence of both cervix and uterus: Secondary | ICD-10-CM | POA: Diagnosis not present

## 2018-08-08 DIAGNOSIS — M8589 Other specified disorders of bone density and structure, multiple sites: Secondary | ICD-10-CM | POA: Diagnosis not present

## 2018-08-08 DIAGNOSIS — Z803 Family history of malignant neoplasm of breast: Secondary | ICD-10-CM | POA: Diagnosis not present

## 2018-08-08 DIAGNOSIS — R29890 Loss of height: Secondary | ICD-10-CM | POA: Diagnosis not present

## 2018-08-08 DIAGNOSIS — Z1231 Encounter for screening mammogram for malignant neoplasm of breast: Secondary | ICD-10-CM | POA: Diagnosis not present

## 2018-08-09 ENCOUNTER — Encounter: Payer: Medicare HMO | Admitting: Internal Medicine

## 2018-08-23 ENCOUNTER — Telehealth: Payer: Self-pay | Admitting: Internal Medicine

## 2018-08-23 NOTE — Telephone Encounter (Signed)
Pt calling regarding referral [phone]

## 2018-08-29 ENCOUNTER — Other Ambulatory Visit: Payer: Self-pay | Admitting: *Deleted

## 2018-08-29 DIAGNOSIS — H2513 Age-related nuclear cataract, bilateral: Secondary | ICD-10-CM | POA: Diagnosis not present

## 2018-08-29 DIAGNOSIS — H40013 Open angle with borderline findings, low risk, bilateral: Secondary | ICD-10-CM | POA: Diagnosis not present

## 2018-08-29 DIAGNOSIS — E119 Type 2 diabetes mellitus without complications: Secondary | ICD-10-CM | POA: Diagnosis not present

## 2018-08-29 LAB — HM DIABETES EYE EXAM

## 2018-08-30 ENCOUNTER — Encounter: Payer: Self-pay | Admitting: *Deleted

## 2018-09-09 ENCOUNTER — Encounter: Payer: Self-pay | Admitting: Internal Medicine

## 2018-09-09 ENCOUNTER — Other Ambulatory Visit: Payer: Self-pay

## 2018-09-09 ENCOUNTER — Ambulatory Visit (INDEPENDENT_AMBULATORY_CARE_PROVIDER_SITE_OTHER): Payer: Medicare HMO | Admitting: Internal Medicine

## 2018-09-09 DIAGNOSIS — Z7983 Long term (current) use of bisphosphonates: Secondary | ICD-10-CM

## 2018-09-09 DIAGNOSIS — M1611 Unilateral primary osteoarthritis, right hip: Secondary | ICD-10-CM | POA: Diagnosis not present

## 2018-09-09 DIAGNOSIS — M8949 Other hypertrophic osteoarthropathy, multiple sites: Secondary | ICD-10-CM

## 2018-09-09 DIAGNOSIS — M159 Polyosteoarthritis, unspecified: Secondary | ICD-10-CM

## 2018-09-09 NOTE — Assessment & Plan Note (Signed)
Osteoarthritis: Mariah Henderson has a longstanding history of osteoarthritis for which she reports she has been taking over-the-counter ibuprofen.  She also has osteopenia with T score of -2.4 and has been on Fosamax 70 mg weekly for which she has been tolerating very well.  I spoke to her on the phone and she reports of right hip/side pain which has been unrelieved with her twice daily ibuprofen.  She states that the pain worsens with ambulation, sitting and lying down.  She reports previously Tylenol had worked for her and was calling to make sure which medication to take.  She denies any recent falls.  Plan: - Advised to take scheduled Tylenol with ibuprofen TID for 5 days until symptom relief (her last BMP was unremarkable) -Advised to call back if no relief

## 2018-09-09 NOTE — Progress Notes (Signed)
  [facility] Internal Medicine Residency Telephone Encounter Continuity Care Appointment  HPI:   This telephone encounter was created for [name] on 09/09/2018 for the following purpose/cc: Right hip pain.  Mrs. Ennes is a very pleasant 68 year old African-American woman with history of osteoarthritis, osteopenia on Fosamax 70 mg weekly who I had the pleasure of speaking to on the phone with complaints of right hip pain.  Please see problem based charting for further details.   Past Medical History:  Past Medical History:  Diagnosis Date  . Acute medial meniscus tear    right knee  . Anxiety   . Asthma   . Cervical spondylosis   . Chronic serous otitis media   . Constipation   . COPD (chronic obstructive pulmonary disease) ([place])   . Depression    followed by [name]; no longer of depakote, seroquel  . Dysuria 12/27/2009   Qualifier: Diagnosis of  By: [name] MD, Amanjot    . Foot drop   . GERD (gastroesophageal reflux disease)   . Heart murmur   . Hyperlipidemia   . Hypertension   . Low back pain   . Obesity   . Paraumbilical hernia   . Pre-diabetes       ROS:  Review of Systems  Musculoskeletal: Positive for joint pain (Right hip/Side). Negative for back pain and falls.     Assessment / Plan / Recommendations:   Please see A&P under problem oriented charting for assessment of the patient's acute and chronic medical conditions.   As always, pt is advised that if symptoms worsen or new symptoms arise, they should go to an urgent care facility or to to ER for further evaluation.   Consent and Medical Decision Making:   Patient discussed with Dr. Daryll Drown  This is a telephone encounter between Mariah Henderson and Jean Rosenthal on 09/09/2018 for right hip pain. The visit was conducted with the patient located at home and Jean Rosenthal at [facility]. The patient's identity was confirmed using their DOB and current address. The patient has consented to being  evaluated through a telephone encounter and understands the associated risks (an examination cannot be done and the patient may need to come in for an appointment) / benefits (allows the patient to remain at home, decreasing exposure to coronavirus). I personally spent 10 minutes on medical discussion.

## 2018-09-10 NOTE — Progress Notes (Signed)
Internal Medicine Clinic Attending  Case discussed with Dr. Agyei at the time of the visit.  We reviewed the resident's history, telephone conversation and pertinent patient test results.  I agree with the assessment, diagnosis, and plan of care documented in the resident's note.   

## 2018-09-18 DIAGNOSIS — R69 Illness, unspecified: Secondary | ICD-10-CM | POA: Diagnosis not present

## 2018-09-20 DIAGNOSIS — R69 Illness, unspecified: Secondary | ICD-10-CM | POA: Diagnosis not present

## 2018-09-23 ENCOUNTER — Other Ambulatory Visit: Payer: Self-pay | Admitting: Internal Medicine

## 2018-10-04 ENCOUNTER — Ambulatory Visit (INDEPENDENT_AMBULATORY_CARE_PROVIDER_SITE_OTHER): Payer: Medicare HMO | Admitting: Internal Medicine

## 2018-10-04 ENCOUNTER — Encounter: Payer: Self-pay | Admitting: Internal Medicine

## 2018-10-04 ENCOUNTER — Other Ambulatory Visit: Payer: Self-pay

## 2018-10-04 VITALS — BP 137/76 | HR 78 | Temp 99.3°F | Ht 60.0 in | Wt 176.3 lb

## 2018-10-04 DIAGNOSIS — H6121 Impacted cerumen, right ear: Secondary | ICD-10-CM

## 2018-10-04 DIAGNOSIS — H409 Unspecified glaucoma: Secondary | ICD-10-CM | POA: Diagnosis not present

## 2018-10-04 DIAGNOSIS — Z79899 Other long term (current) drug therapy: Secondary | ICD-10-CM

## 2018-10-04 DIAGNOSIS — K219 Gastro-esophageal reflux disease without esophagitis: Secondary | ICD-10-CM

## 2018-10-04 DIAGNOSIS — R1310 Dysphagia, unspecified: Secondary | ICD-10-CM | POA: Diagnosis not present

## 2018-10-04 DIAGNOSIS — E669 Obesity, unspecified: Secondary | ICD-10-CM

## 2018-10-04 DIAGNOSIS — M25511 Pain in right shoulder: Secondary | ICD-10-CM | POA: Diagnosis not present

## 2018-10-04 DIAGNOSIS — H9319 Tinnitus, unspecified ear: Secondary | ICD-10-CM

## 2018-10-04 DIAGNOSIS — E119 Type 2 diabetes mellitus without complications: Secondary | ICD-10-CM | POA: Diagnosis not present

## 2018-10-04 DIAGNOSIS — E785 Hyperlipidemia, unspecified: Secondary | ICD-10-CM

## 2018-10-04 DIAGNOSIS — M25512 Pain in left shoulder: Secondary | ICD-10-CM

## 2018-10-04 DIAGNOSIS — G4452 New daily persistent headache (NDPH): Secondary | ICD-10-CM | POA: Diagnosis not present

## 2018-10-04 DIAGNOSIS — Z6834 Body mass index (BMI) 34.0-34.9, adult: Secondary | ICD-10-CM

## 2018-10-04 DIAGNOSIS — E1139 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: Secondary | ICD-10-CM

## 2018-10-04 DIAGNOSIS — I10 Essential (primary) hypertension: Secondary | ICD-10-CM

## 2018-10-04 DIAGNOSIS — H42 Glaucoma in diseases classified elsewhere: Secondary | ICD-10-CM

## 2018-10-04 DIAGNOSIS — R51 Headache: Secondary | ICD-10-CM

## 2018-10-04 DIAGNOSIS — Z23 Encounter for immunization: Secondary | ICD-10-CM

## 2018-10-04 DIAGNOSIS — R42 Dizziness and giddiness: Secondary | ICD-10-CM

## 2018-10-04 DIAGNOSIS — J4540 Moderate persistent asthma, uncomplicated: Secondary | ICD-10-CM

## 2018-10-04 DIAGNOSIS — M1990 Unspecified osteoarthritis, unspecified site: Secondary | ICD-10-CM

## 2018-10-04 LAB — POCT GLYCOSYLATED HEMOGLOBIN (HGB A1C): Hemoglobin A1C: 6.6 % — AB (ref 4.0–5.6)

## 2018-10-04 LAB — GLUCOSE, CAPILLARY: Glucose-Capillary: 170 mg/dL — ABNORMAL HIGH (ref 70–99)

## 2018-10-04 MED ORDER — ALBUTEROL SULFATE HFA 108 (90 BASE) MCG/ACT IN AERS: 1.0000 | INHALATION_SPRAY | Freq: Four times a day (QID) | RESPIRATORY_TRACT | 1 refills | Status: DC | PRN

## 2018-10-04 MED ORDER — FLUTICASONE-SALMETEROL 100-50 MCG/DOSE IN AEPB: 1.0000 | INHALATION_SPRAY | Freq: Two times a day (BID) | RESPIRATORY_TRACT | 11 refills | Status: DC

## 2018-10-04 NOTE — Patient Instructions (Signed)
Mariah Henderson - -  Thank you for coming in to see me today!  For your headaches, Please take 800mg  of ibuprofen up to twice per day.  Only take this dose for 3 days and let me know if the headache is better next week.   I would like to get a CT scan of your head.  Please schedule this when you can.   Thank you!   Come back to see me in 2 months, sooner if needed.   I will call you with your blood work.

## 2018-10-04 NOTE — Progress Notes (Signed)
Subjective:    [name], female    DOB: 01-05-1951, 68 y.o.   MRN: [id number]  CC: 2 month follow up for pain in the shoulder  HPI  Ms. Weckman is a 68 year old woman with PMH of HTN, allergy, GERD, dysphagia, DM2, vertigo, HLD, glaucoma, depression, obesity and arthritis who presents for routine follow up.  She was last seen for shoulder pain and was given tylenol to try, but this did not help much.  She notes today bilateral shoulder pain which feels interior to the joint, stiff hand joints with one trigger finger, and daily headaches.  She does not normally get headaches.  She wakes up with the headache, feels like bees in her head, generalized, but does not radiate, sometimes associated with her vertigo.  She reports that it is occasionally throbbing, but mostly feels like pulling in her head.  She has not tried anything that makes it better.  She does have glaucoma and worsening vision.  She will need surgery soon.  She denies haloes around lights or eye pain.  She does not have sleeping problems.  She does feel like the headache gets worse with leaning down.  Rest does not help it.    Otherwise, she is doing well.  Main issue is discussed above.  She reports that she has not been following her normal diet, but has lost weight, about 14 pounds, despite of this.  She was surprised that she had lost weight.  She does not report fatigue, night sweats or other B symptoms.  She has had a mammogram this year and does not have new blood loss in her stool and does not have return of periods. She is a former smoker and she quit in 1998 per report.    Review of Systems  Constitutional: Positive for unexpected weight change. Negative for activity change, appetite change and fatigue.  HENT: Positive for ear pain and tinnitus. Negative for nosebleeds, postnasal drip and rhinorrhea.   Eyes: Positive for visual disturbance. Negative for pain and discharge.  Respiratory: Negative for cough  and shortness of breath.   Cardiovascular: Negative for chest pain and leg swelling.  Genitourinary: Negative for difficulty urinating and dysuria.  Musculoskeletal: Positive for arthralgias and joint swelling (hands). Negative for back pain, gait problem, neck pain and neck stiffness.  Neurological: Positive for dizziness, light-headedness and headaches. Negative for weakness.       Objective:   Physical Exam Vitals signs and nursing note reviewed.  Constitutional:      Appearance: Normal appearance.  HENT:     Head: Normocephalic and atraumatic.     Right Ear: External ear normal. There is impacted cerumen.     Left Ear: Tympanic membrane, ear canal and external ear normal.  Eyes:     General:        Right eye: No discharge.        Left eye: No discharge.     Extraocular Movements: Extraocular movements intact.     Conjunctiva/sclera: Conjunctivae normal.  Neck:     Musculoskeletal: Normal range of motion. No muscular tenderness.  Cardiovascular:     Rate and Rhythm: Normal rate and regular rhythm.     Pulses: Normal pulses.     Heart sounds: No murmur.  Pulmonary:     Effort: Pulmonary effort is normal. No respiratory distress.  Musculoskeletal:        General: No swelling.     Right lower leg: No edema.  Left lower leg: No edema.     Comments: She has  Limited range of motion of shoulders.  She has TTP over the [facility] joint, lateral shoulder and scapula bilaterally. She has a normal shoulder arc movement, normal strength to IR, ER, abduction and adduction.  She has no rash on the shoulders.    Skin:    General: Skin is warm and dry.     Findings: No rash.  Neurological:     General: No focal deficit present.     Mental Status: She is alert and oriented to person, place, and time.  Psychiatric:        Mood and Affect: Mood normal.        Behavior: Behavior normal.     CBC, CMET, CRP, Sed rate today  CT head when able to be scheduled.      Assessment & Plan:   RTC in 2 months, sooner if needed

## 2018-10-05 LAB — CBC WITH DIFFERENTIAL/PLATELET
Basophils Absolute: 0.1 10*3/uL (ref 0.0–0.2)
Basos: 1 %
EOS (ABSOLUTE): 0.3 10*3/uL (ref 0.0–0.4)
Eos: 5 %
Hematocrit: 39.5 % (ref 34.0–46.6)
Hemoglobin: 13 g/dL (ref 11.1–15.9)
Immature Grans (Abs): 0 10*3/uL (ref 0.0–0.1)
Immature Granulocytes: 0 %
Lymphocytes Absolute: 0.5 10*3/uL — ABNORMAL LOW (ref 0.7–3.1)
Lymphs: 8 %
MCH: 28.6 pg (ref 26.6–33.0)
MCHC: 32.9 g/dL (ref 31.5–35.7)
MCV: 87 fL (ref 79–97)
Monocytes Absolute: 0.6 10*3/uL (ref 0.1–0.9)
Monocytes: 9 %
Neutrophils Absolute: 4.8 10*3/uL (ref 1.4–7.0)
Neutrophils: 77 %
Platelets: 259 10*3/uL (ref 150–450)
RBC: 4.55 x10E6/uL (ref 3.77–5.28)
RDW: 11.9 % (ref 11.7–15.4)
WBC: 6.2 10*3/uL (ref 3.4–10.8)

## 2018-10-05 LAB — CMP14 + ANION GAP
ALT: 11 IU/L (ref 0–32)
AST: 18 IU/L (ref 0–40)
Albumin/Globulin Ratio: 1.7 (ref 1.2–2.2)
Albumin: 4.2 g/dL (ref 3.8–4.8)
Alkaline Phosphatase: 105 IU/L (ref 39–117)
Anion Gap: 18 mmol/L (ref 10.0–18.0)
BUN/Creatinine Ratio: 13 (ref 12–28)
BUN: 11 mg/dL (ref 8–27)
Bilirubin Total: 0.3 mg/dL (ref 0.0–1.2)
CO2: 23 mmol/L (ref 20–29)
Calcium: 9.7 mg/dL (ref 8.7–10.3)
Chloride: 101 mmol/L (ref 96–106)
Creatinine, Ser: 0.83 mg/dL (ref 0.57–1.00)
GFR calc Af Amer: 83 mL/min/{1.73_m2} (ref >59)
GFR calc non Af Amer: 72 mL/min/{1.73_m2} (ref >59)
Globulin, Total: 2.5 g/dL (ref 1.5–4.5)
Glucose: 140 mg/dL — ABNORMAL HIGH (ref 65–99)
Potassium: 4.3 mmol/L (ref 3.5–5.2)
Sodium: 142 mmol/L (ref 134–144)
Total Protein: 6.7 g/dL (ref 6.0–8.5)

## 2018-10-05 LAB — C-REACTIVE PROTEIN: CRP: 3 mg/L (ref 0–10)

## 2018-10-05 LAB — SEDIMENTATION RATE: Sed Rate: 29 mm/hr (ref 0–40)

## 2018-10-08 ENCOUNTER — Telehealth: Payer: Self-pay | Admitting: *Deleted

## 2018-10-08 ENCOUNTER — Telehealth: Payer: Self-pay | Admitting: Internal Medicine

## 2018-10-08 DIAGNOSIS — G4452 New daily persistent headache (NDPH): Secondary | ICD-10-CM | POA: Insufficient documentation

## 2018-10-08 NOTE — Telephone Encounter (Signed)
Attempted to call Ms Yeater re: lab results, VM full, could not leave a message.   Glenda - If Ms. [place] calls back can you please tell her the following:   Lab results were normal, no explanation for headaches.   Please schedule Head CT when you are able.   You need to call [facility] directly for scheduling for dysphagia.   Thank you!  Gilles Chiquito, MD

## 2018-10-08 NOTE — Telephone Encounter (Signed)
Pt rtc to dr Daryll Drown, gave her message, she will call [facility] now and then schedule head ct

## 2018-10-08 NOTE — Assessment & Plan Note (Signed)
She was not able to go to GI appointment due to cancellation.  Will re-send consult

## 2018-10-08 NOTE — Assessment & Plan Note (Signed)
Trial of ibuprofen for 3 days as noted in new daily headache problem.  She will call if not improving.

## 2018-10-08 NOTE — Assessment & Plan Note (Signed)
BP today was relatively well controled at 137/76.  She is taking HCTZ and lasix without issues.   Plan Continue current therapy

## 2018-10-08 NOTE — Assessment & Plan Note (Signed)
She has been having issues with dizziness and vertigo for which she is following with ENT.  These headaches have seemed to come on at the same time as her dizziness, but they do occur daily, and are like a buzzing or pulling.  She has tried tylenol which has not relieved them.  She has not tried ibuprofen or other NSAIDs.  She does also have shoulder girdle pain and stiffness, but this has been more chronic in nature.  She states tylenol is not helping with that issue either.    Plan Trial of ibuprofen 839m for 3 days CT head given onset of pain ESR and CRP WNL

## 2018-10-14 ENCOUNTER — Telehealth: Payer: Self-pay | Admitting: Internal Medicine

## 2018-10-14 DIAGNOSIS — R42 Dizziness and giddiness: Secondary | ICD-10-CM

## 2018-10-14 MED ORDER — MECLIZINE HCL 12.5 MG PO TABS: ORAL_TABLET | ORAL | 0 refills | Status: DC

## 2018-10-14 NOTE — Telephone Encounter (Signed)
Called pt - stated continues to c/o dizziness which started on Saturday (worse) stated this happens when she gets up and reaches for something. Stated she has taken Meclizine before; requesting a refill. Thanks

## 2018-10-14 NOTE — Telephone Encounter (Signed)
Will refill.  She should also call her ENT physician who has been working with her on this issue.  Why does radiology want it changed?  I would prefer the CT which is ordered and ask that they schedule it.  Thanks!

## 2018-10-14 NOTE — Telephone Encounter (Signed)
Pt calling to report she is still having dizzy spells.  Pt states she is really dizzy this morning and would like a call back from a Nurse, also, unable to sch patient for Imaging Order for Ct With and Without Contrast with [place] has already Authorized. Will need to get a new Authorization if Order is changed.   Per Radiology scheduling order will need to be Changed to CT Without Only in order for it to be sch.  Please advise.

## 2018-10-15 NOTE — Telephone Encounter (Signed)
Called pt - informed meclizine was refilled; stated she had called her ENT doctor this morning.

## 2018-10-18 ENCOUNTER — Other Ambulatory Visit: Payer: Self-pay | Admitting: Internal Medicine

## 2018-10-18 DIAGNOSIS — G4452 New daily persistent headache (NDPH): Secondary | ICD-10-CM

## 2018-10-18 NOTE — Progress Notes (Signed)
Please update order.  Thank you

## 2018-11-03 DIAGNOSIS — R69 Illness, unspecified: Secondary | ICD-10-CM | POA: Diagnosis not present

## 2018-11-10 ENCOUNTER — Other Ambulatory Visit: Payer: Self-pay | Admitting: Internal Medicine

## 2018-11-10 DIAGNOSIS — I10 Essential (primary) hypertension: Secondary | ICD-10-CM

## 2018-11-12 ENCOUNTER — Other Ambulatory Visit: Payer: Self-pay | Admitting: Internal Medicine

## 2018-11-12 DIAGNOSIS — I10 Essential (primary) hypertension: Secondary | ICD-10-CM

## 2018-11-12 NOTE — Telephone Encounter (Signed)
Needs refills on hydrochlorothiazide (MICROZIDE) 12.5 MG capsule  ;pt contact [phone]  [id number] - [place], [address] AT [address]

## 2018-11-13 MED ORDER — HYDROCHLOROTHIAZIDE 12.5 MG PO CAPS: 12.5000 mg | ORAL_CAPSULE | Freq: Every day | ORAL | 1 refills | Status: DC

## 2018-11-27 ENCOUNTER — Ambulatory Visit (HOSPITAL_COMMUNITY): Payer: Medicare HMO

## 2018-12-05 ENCOUNTER — Ambulatory Visit (HOSPITAL_COMMUNITY)
Admission: RE | Admit: 2018-12-05 | Discharge: 2018-12-05 | Disposition: A | Discharge status: Home / Self Care | Payer: Medicare HMO | Source: Ambulatory Visit | Attending: Internal Medicine | Admitting: Internal Medicine

## 2018-12-05 ENCOUNTER — Other Ambulatory Visit: Payer: Self-pay

## 2018-12-05 DIAGNOSIS — G4452 New daily persistent headache (NDPH): Secondary | ICD-10-CM | POA: Diagnosis not present

## 2018-12-05 DIAGNOSIS — R519 Headache, unspecified: Secondary | ICD-10-CM | POA: Diagnosis not present

## 2018-12-10 ENCOUNTER — Telehealth: Payer: Self-pay | Admitting: Internal Medicine

## 2018-12-10 DIAGNOSIS — R42 Dizziness and giddiness: Secondary | ICD-10-CM

## 2018-12-10 MED ORDER — MECLIZINE HCL 12.5 MG PO TABS: ORAL_TABLET | ORAL | 3 refills | Status: DC

## 2018-12-10 NOTE — Telephone Encounter (Signed)
Called to discuss head CT with patient.  Confirmed identity with name and DOB.   Head CT normal, which was reassuring to patient.   She continues to have dizziness which she relates to her vertigo which is helped with meclizine.  Sometimes her head hurts, but this is not overly concerning to her.  She feels all of this is related to her sinuses, and we have not found any other cause at this time.   Requests a refill of meclizine which was placed.   I advised her to make a follow up appointment with me in January.  Can be telehealth if needed.   Gilles Chiquito, MD

## 2018-12-19 DIAGNOSIS — H40013 Open angle with borderline findings, low risk, bilateral: Secondary | ICD-10-CM | POA: Diagnosis not present

## 2018-12-19 DIAGNOSIS — E119 Type 2 diabetes mellitus without complications: Secondary | ICD-10-CM | POA: Diagnosis not present

## 2018-12-19 DIAGNOSIS — H25813 Combined forms of age-related cataract, bilateral: Secondary | ICD-10-CM | POA: Diagnosis not present

## 2018-12-20 DIAGNOSIS — R69 Illness, unspecified: Secondary | ICD-10-CM | POA: Diagnosis not present

## 2019-02-07 ENCOUNTER — Encounter: Payer: Self-pay | Admitting: Internal Medicine

## 2019-02-07 ENCOUNTER — Ambulatory Visit (INDEPENDENT_AMBULATORY_CARE_PROVIDER_SITE_OTHER): Payer: Medicare Other | Admitting: Internal Medicine

## 2019-02-07 ENCOUNTER — Other Ambulatory Visit: Payer: Self-pay | Admitting: Internal Medicine

## 2019-02-07 ENCOUNTER — Other Ambulatory Visit: Payer: Self-pay

## 2019-02-07 VITALS — BP 142/82 | HR 82 | Temp 98.9°F | Ht 60.0 in | Wt 177.4 lb

## 2019-02-07 DIAGNOSIS — M25552 Pain in left hip: Secondary | ICD-10-CM

## 2019-02-07 DIAGNOSIS — E119 Type 2 diabetes mellitus without complications: Secondary | ICD-10-CM

## 2019-02-07 DIAGNOSIS — Z79899 Other long term (current) drug therapy: Secondary | ICD-10-CM

## 2019-02-07 DIAGNOSIS — R42 Dizziness and giddiness: Secondary | ICD-10-CM

## 2019-02-07 DIAGNOSIS — K219 Gastro-esophageal reflux disease without esophagitis: Secondary | ICD-10-CM

## 2019-02-07 DIAGNOSIS — J4540 Moderate persistent asthma, uncomplicated: Secondary | ICD-10-CM

## 2019-02-07 DIAGNOSIS — I10 Essential (primary) hypertension: Secondary | ICD-10-CM

## 2019-02-07 DIAGNOSIS — M25551 Pain in right hip: Secondary | ICD-10-CM

## 2019-02-07 DIAGNOSIS — M8949 Other hypertrophic osteoarthropathy, multiple sites: Secondary | ICD-10-CM

## 2019-02-07 DIAGNOSIS — M1990 Unspecified osteoarthritis, unspecified site: Secondary | ICD-10-CM | POA: Diagnosis not present

## 2019-02-07 DIAGNOSIS — F325 Major depressive disorder, single episode, in full remission: Secondary | ICD-10-CM

## 2019-02-07 DIAGNOSIS — G8929 Other chronic pain: Secondary | ICD-10-CM

## 2019-02-07 DIAGNOSIS — E1139 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: Secondary | ICD-10-CM | POA: Insufficient documentation

## 2019-02-07 DIAGNOSIS — M159 Polyosteoarthritis, unspecified: Secondary | ICD-10-CM

## 2019-02-07 DIAGNOSIS — G4452 New daily persistent headache (NDPH): Secondary | ICD-10-CM

## 2019-02-07 DIAGNOSIS — M069 Rheumatoid arthritis, unspecified: Secondary | ICD-10-CM

## 2019-02-07 DIAGNOSIS — R519 Headache, unspecified: Secondary | ICD-10-CM

## 2019-02-07 LAB — POCT GLYCOSYLATED HEMOGLOBIN (HGB A1C): Hemoglobin A1C: 6.8 % — AB (ref 4.0–5.6)

## 2019-02-07 LAB — GLUCOSE, CAPILLARY: Glucose-Capillary: 148 mg/dL — ABNORMAL HIGH (ref 70–99)

## 2019-02-07 MED ORDER — ONETOUCH ULTRA VI STRP: 1.0000 | ORAL_STRIP | 3 refills | Status: DC | PRN

## 2019-02-07 MED ORDER — CELECOXIB 100 MG PO CAPS: 100.0000 mg | ORAL_CAPSULE | Freq: Two times a day (BID) | ORAL | 0 refills | Status: DC

## 2019-02-07 MED ORDER — ALBUTEROL SULFATE HFA 108 (90 BASE) MCG/ACT IN AERS: 1.0000 | INHALATION_SPRAY | Freq: Four times a day (QID) | RESPIRATORY_TRACT | 1 refills | Status: DC | PRN

## 2019-02-07 NOTE — Progress Notes (Signed)
   Subjective:    [name], female    DOB: October 10, 1950, 69 y.o.   MRN: [id number]  CC: 4 month follow up for HTN, DM2  HPI   Mariah Henderson is a 69 year old woman with PMH of HTN, chronic arthritis, GERD, RAD, DM2 who presents today for follow up.   She has some mild elevated BP today, but notes that she did not take her BP medications this morning.  She has been well controlled in the past.    Her main concern today is hip pain.  She notes that this has been worsening and she has been more sedentary because of it.  She has had no trauma, no falls.  She has tried voltaren gel which does not seem to help.  She has taken over the counter tylenol and ibuprofen which seem to make her sleepy.  She takes chronic gabapentin and she does not feel like that helps either.  I reviewed her previous hip films, and she does not have any arthritis in that hip particularly.    A1C today is 6.8.  She is diet controlled for her DM2.   Refills were provided.   Review of Systems  Constitutional: Negative for activity change, appetite change and fever.  Respiratory: Negative for cough, choking and shortness of breath.   Cardiovascular: Negative for chest pain and leg swelling.  Genitourinary: Negative for difficulty urinating and dysuria.  Musculoskeletal: Positive for arthralgias, back pain and gait problem (slowed walking, no falls).  Neurological: Positive for dizziness (occasionally due to vertigo. ) and headaches (on and off). Negative for weakness.  Psychiatric/Behavioral: Negative for confusion and decreased concentration.       Objective:   Physical Exam Vitals and nursing note reviewed.  Constitutional:      General: She is not in acute distress.    Appearance: She is obese. She is not toxic-appearing.  HENT:     Head: Normocephalic and atraumatic.  Cardiovascular:     Rate and Rhythm: Normal rate and regular rhythm.     Pulses: Normal pulses.     Heart sounds: No murmur.   Pulmonary:     Effort: Pulmonary effort is normal. No respiratory distress.     Breath sounds: Normal breath sounds. No wheezing.  Abdominal:     General: Bowel sounds are normal.     Palpations: Abdomen is soft.  Musculoskeletal:     Comments: Main issue is tenderness of the left lumbar paraspinal area into the gluteus muscle and specifically around the piriformis bursa.  SLR bilaterally were negative.  She has decreased ROM of the hip joints, but no pain with internal or external rotation bilaterally.  She has tense paraspinal muscles and "soreness" with palpation up and down the entire spine.  She has some mild tenderness at the greater trochanteric bursa.  No tenderness with palpation at the groin/inguinal area.   Skin:    General: Skin is warm and dry.  Neurological:     General: No focal deficit present.     Mental Status: She is alert and oriented to person, place, and time. Mental status is at baseline.     Motor: No weakness.     Gait: Gait normal.    A1C today       Assessment & Plan:  RTC in 3 months, sooner if needed.

## 2019-02-07 NOTE — Telephone Encounter (Signed)
Need refill on [facility] ULTRA test strip  ;pt contact [phone]   Pls is coming in today,      [facility] [phone] - [place], [address] AT [address]

## 2019-02-07 NOTE — Patient Instructions (Signed)
[name] - -  Thank you for coming in today!  I have sent in refills for your medicines.   For your pain, Please STOP ibuprofen.   Try Celebrex (celecoxib) 100mg  twice a day for 14 days to see if this helps.   I have sent in a referral for you to try physical therapy to see if that will help as well.   Thank you!  Come back to see me in 3 months.   Celecoxib capsules What is this medicine? CELECOXIB (sell a KOX ib) is a non-steroidal anti-inflammatory drug (NSAID). This medicine is used to treat arthritis and ankylosing spondylitis. It may be also used for pain or painful monthly periods. This medicine may be used for other purposes; ask your health care provider or pharmacist if you have questions. COMMON BRAND NAME(S): Celebrex What should I tell my health care provider before I take this medicine? They need to know if you have any of these conditions:  cigarette smoker  coronary artery bypass graft (CABG) surgery within the past 2 weeks  drink more than 3 alcohol-containing drinks a day  heart disease  high blood pressure  history of stomach bleeding  kidney disease  liver disease  lung or breathing disease, like asthma  an unusual or allergic reaction to celecoxib, sulfa drugs, aspirin, other NSAIDs, other medicines, foods, dyes, or preservatives  pregnant or trying to get pregnant  breast-feeding How should I use this medicine? Take this medicine by mouth with a glass of water. Follow the directions on the prescription label. Take your medicine at regular intervals. Do not take it more often than directed. Do not stop taking except on your doctor's advice. A special MedGuide will be given to you by the pharmacist with each prescription and refill. Be sure to read this information carefully each time. Talk to your pediatrician regarding the use of this medicine in children. While this drug may be prescribed for children as young as 2 years for selected  conditions, precautions do apply. Overdosage: If you think you have taken too much of this medicine contact a poison control center or emergency room at once. NOTE: This medicine is only for you. Do not share this medicine with others. What if I miss a dose? If you miss a dose, take it as soon as you can. If it is almost time for your next dose, take only that dose. Do not take double or extra doses. What may interact with this medicine? Do not take this medicine with any of the following medications:  cidofovir  ketorolac  thioridazine This medicine may also interact with the following medications:  alcohol  aspirin and aspirin-like medicines  atomoxetine  certain medicines for blood pressure, heart disease, irregular heart beat  certain medicines for depression, anxiety, or psychotic disorders  certain medicines that treat or prevent blood clots like warfarin, enoxaparin, dalteparin, apixaban, dabigatran, and rivaroxaban  cyclosporine  digoxin  diuretics  fluconazole  lithium  methotrexate  other NSAIDs, medicines for pain and inflammation, like ibuprofen or naproxen  pemetrexed  rifampin  steroid medicines like prednisone or cortisone This list may not describe all possible interactions. Give your health care provider a list of all the medicines, herbs, non-prescription drugs, or dietary supplements you use. Also tell them if you smoke, drink alcohol, or use illegal drugs. Some items may interact with your medicine. What should I watch for while using this medicine? Visit your health care professional for regular checks on your progress.  Tell your health care professional if your symptoms do not start to get better or if they get worse. This medicine may cause serious skin reactions. They can happen weeks to months after starting the medicine. Contact your healthcare provider right away if you notice fevers or flu-like symptoms with a rash. The rash may be red or  purple and then turn into blisters or peeling of the skin. Or, you might notice a red rash with swelling of the face, lips or lymph nodes in your neck or under your arms. Do not take other medicines that contain aspirin, ibuprofen, or naproxen with this medicine. Side effects such as stomach upset, nausea, or ulcers may be more likely to occur. Many non-prescription medicines contain aspirin, ibuprofen, or naproxen. Always read labels carefully. This medicine can cause ulcers and bleeding in the stomach and intestines at any time during treatment. This can happen with no warning and may cause death. Smoking, drinking alcohol, older age, and poor health can also increase risks. Call your health care professional right away if you have stomach pain or blood in your vomit or stool. This medicine does not prevent a heart attack or stroke. This medicine may increase the chance of a heart attack or stroke. The chance may increase the longer you use this medicine or if you have heart disease. If you take aspirin to prevent a heart attack or stroke, talk to your health care professional about using this medicine. This medicine may increase your risk to bruise or bleed. Call your health care professional if you notice any unusual bleeding. What side effects may I notice from receiving this medicine? Side effects that you should report to your doctor or health care professional as soon as possible:  allergic reactions like skin rash, itching or hives, swelling of the face, lips, or tongue  rash, fever, and swollen lymph nodes  redness, blistering, peeling or loosening of the skin, including inside the mouth  signs and symptoms of a blood clot such as chest pain; shortness of breath; pain, swelling, or warmth in the leg  signs and symptoms of a stroke like changes in vision; confusion; trouble speaking or understanding; severe headaches; sudden numbness or weakness of the face, arm or leg; trouble walking;  dizziness; loss of balance or coordination  signs and symptoms of bleeding such as bloody or black, tarry stools; red or dark brown urine; spitting up blood or brown material that looks like coffee grounds; red spots on the skin; unusual bruising or bleeding from the eyes, gums, or nose  signs and symptoms of kidney injury like trouble passing urine or change in the amount of urine  signs and symptoms of liver injury like dark yellow or brown urine; general ill feeling or flu-like symptoms; light-colored stools; loss of appetite; nausea; right upper belly pain; unusually weak or tired; yellowing of the eyes or skin  signs and symptoms of low red blood cells or anemia such as unusually weak or tired; feeling faint or lightheaded; falls; breathing problems Side effects that usually do not require medical attention (report to your doctor or health care professional if they continue or are bothersome):  diarrhea  gas  headache  nausea, vomiting  trouble sleeping  upset stomach This list may not describe all possible side effects. Call your doctor for medical advice about side effects. You may report side effects to FDA at 1-800-FDA-1088. Where should I keep my medicine? Keep out of the reach of children. Store at room  temperature between 15 and 30 degrees C (59 and 86 degrees F). Throw away any unused medicine after the expiration date. NOTE: This sheet is a summary. It may not cover all possible information. If you have questions about this medicine, talk to your doctor, pharmacist, or health care provider.  2020 Elsevier/Gold Standard (2018-05-24 13:43:11)

## 2019-02-07 NOTE — Telephone Encounter (Signed)
Request sent to dr Daryll Drown earlier from electronic request

## 2019-02-08 NOTE — Assessment & Plan Note (Signed)
BP elevated, advised her to take her medications when she got home.

## 2019-02-08 NOTE — Assessment & Plan Note (Signed)
A1C is 6.8 today which is well controlled.  She has no new neuropathy or other issues.

## 2019-02-08 NOTE — Assessment & Plan Note (Signed)
This was the main thing we discussed today.  I think she has some chronic muscular pain in the back and likely some piriformis dysfunction on the right.  I advised her to try PT and she is willing.  She will also try 2 weeks of Celebrex to see if this helps with inflammation decrease.  She will call if no better.

## 2019-02-08 NOTE — Assessment & Plan Note (Signed)
This does not seem to be an issue for her anymore.  She only has intermittent headaches now.

## 2019-02-17 ENCOUNTER — Encounter: Payer: Self-pay | Admitting: *Deleted

## 2019-02-18 ENCOUNTER — Other Ambulatory Visit: Payer: Self-pay | Admitting: Internal Medicine

## 2019-02-18 DIAGNOSIS — G8929 Other chronic pain: Secondary | ICD-10-CM

## 2019-02-18 DIAGNOSIS — M159 Polyosteoarthritis, unspecified: Secondary | ICD-10-CM

## 2019-02-18 DIAGNOSIS — M8949 Other hypertrophic osteoarthropathy, multiple sites: Secondary | ICD-10-CM

## 2019-03-08 ENCOUNTER — Other Ambulatory Visit: Payer: Self-pay | Admitting: Internal Medicine

## 2019-03-08 DIAGNOSIS — J4540 Moderate persistent asthma, uncomplicated: Secondary | ICD-10-CM

## 2019-03-10 ENCOUNTER — Other Ambulatory Visit: Payer: Self-pay | Admitting: Cardiology

## 2019-03-10 DIAGNOSIS — Z20822 Contact with and (suspected) exposure to covid-19: Secondary | ICD-10-CM

## 2019-03-11 ENCOUNTER — Other Ambulatory Visit: Payer: Self-pay | Admitting: Internal Medicine

## 2019-03-11 DIAGNOSIS — M25551 Pain in right hip: Secondary | ICD-10-CM

## 2019-03-11 DIAGNOSIS — M159 Polyosteoarthritis, unspecified: Secondary | ICD-10-CM

## 2019-03-11 DIAGNOSIS — M8949 Other hypertrophic osteoarthropathy, multiple sites: Secondary | ICD-10-CM

## 2019-03-11 DIAGNOSIS — G8929 Other chronic pain: Secondary | ICD-10-CM

## 2019-03-11 LAB — NOVEL CORONAVIRUS, NAA: SARS-CoV-2, NAA: NOT DETECTED

## 2019-03-13 ENCOUNTER — Telehealth: Payer: Self-pay

## 2019-03-13 ENCOUNTER — Other Ambulatory Visit: Payer: Self-pay | Admitting: Internal Medicine

## 2019-03-13 NOTE — Telephone Encounter (Signed)
Requesting COVID test result. Please call pt back.

## 2019-03-13 NOTE — Telephone Encounter (Signed)
Great to know!  She is such a pleasant lady.   Thank you  Gilles Chiquito, MD

## 2019-03-13 NOTE — Telephone Encounter (Signed)
RTC to patient, notified Covid screen was Negative from 03/10/2019.  Pt states she was not having any symptoms, but had gone to the hospital to pray for her nephew and thought it was best to be screened, although she was wearing 2 masks.  Pt states she has received her 1st Covid vaccine.  SChaplin, RN,BSN

## 2019-03-17 NOTE — Addendum Note (Signed)
Addended by: Hulan Fray on: 03/17/2019 05:28 PM   Modules accepted: Orders

## 2019-03-24 ENCOUNTER — Other Ambulatory Visit: Payer: Self-pay | Admitting: Internal Medicine

## 2019-03-24 DIAGNOSIS — G8929 Other chronic pain: Secondary | ICD-10-CM

## 2019-03-24 DIAGNOSIS — M8949 Other hypertrophic osteoarthropathy, multiple sites: Secondary | ICD-10-CM

## 2019-03-24 DIAGNOSIS — M25551 Pain in right hip: Secondary | ICD-10-CM

## 2019-03-24 DIAGNOSIS — M159 Polyosteoarthritis, unspecified: Secondary | ICD-10-CM

## 2019-04-06 ENCOUNTER — Other Ambulatory Visit: Payer: Self-pay | Admitting: Internal Medicine

## 2019-04-23 ENCOUNTER — Other Ambulatory Visit: Payer: Self-pay

## 2019-04-23 ENCOUNTER — Encounter: Payer: Self-pay | Admitting: Internal Medicine

## 2019-04-23 ENCOUNTER — Ambulatory Visit (INDEPENDENT_AMBULATORY_CARE_PROVIDER_SITE_OTHER): Payer: Medicare Other | Admitting: Internal Medicine

## 2019-04-23 VITALS — BP 145/75 | HR 66 | Temp 98.4°F | Ht 60.0 in | Wt 178.7 lb

## 2019-04-23 DIAGNOSIS — M75121 Complete rotator cuff tear or rupture of right shoulder, not specified as traumatic: Secondary | ICD-10-CM

## 2019-04-23 DIAGNOSIS — Z79899 Other long term (current) drug therapy: Secondary | ICD-10-CM

## 2019-04-23 DIAGNOSIS — H409 Unspecified glaucoma: Secondary | ICD-10-CM

## 2019-04-23 DIAGNOSIS — R1310 Dysphagia, unspecified: Secondary | ICD-10-CM

## 2019-04-23 DIAGNOSIS — J302 Other seasonal allergic rhinitis: Secondary | ICD-10-CM

## 2019-04-23 DIAGNOSIS — J301 Allergic rhinitis due to pollen: Secondary | ICD-10-CM

## 2019-04-23 DIAGNOSIS — M4722 Other spondylosis with radiculopathy, cervical region: Secondary | ICD-10-CM

## 2019-04-23 DIAGNOSIS — M19011 Primary osteoarthritis, right shoulder: Secondary | ICD-10-CM

## 2019-04-23 DIAGNOSIS — J4520 Mild intermittent asthma, uncomplicated: Secondary | ICD-10-CM

## 2019-04-23 DIAGNOSIS — E1139 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: Secondary | ICD-10-CM | POA: Diagnosis not present

## 2019-04-23 DIAGNOSIS — M8949 Other hypertrophic osteoarthropathy, multiple sites: Secondary | ICD-10-CM

## 2019-04-23 DIAGNOSIS — H42 Glaucoma in diseases classified elsewhere: Secondary | ICD-10-CM

## 2019-04-23 DIAGNOSIS — M8580 Other specified disorders of bone density and structure, unspecified site: Secondary | ICD-10-CM | POA: Diagnosis not present

## 2019-04-23 DIAGNOSIS — M85859 Other specified disorders of bone density and structure, unspecified thigh: Secondary | ICD-10-CM

## 2019-04-23 DIAGNOSIS — G8929 Other chronic pain: Secondary | ICD-10-CM

## 2019-04-23 DIAGNOSIS — M67911 Unspecified disorder of synovium and tendon, right shoulder: Secondary | ICD-10-CM

## 2019-04-23 DIAGNOSIS — F325 Major depressive disorder, single episode, in full remission: Secondary | ICD-10-CM

## 2019-04-23 DIAGNOSIS — M159 Polyosteoarthritis, unspecified: Secondary | ICD-10-CM

## 2019-04-23 DIAGNOSIS — I10 Essential (primary) hypertension: Secondary | ICD-10-CM

## 2019-04-23 DIAGNOSIS — K219 Gastro-esophageal reflux disease without esophagitis: Secondary | ICD-10-CM

## 2019-04-23 DIAGNOSIS — J45909 Unspecified asthma, uncomplicated: Secondary | ICD-10-CM

## 2019-04-23 LAB — GLUCOSE, CAPILLARY: Glucose-Capillary: 125 mg/dL — ABNORMAL HIGH (ref 70–99)

## 2019-04-23 LAB — POCT GLYCOSYLATED HEMOGLOBIN (HGB A1C): Hemoglobin A1C: 7.3 % — AB (ref 4.0–5.6)

## 2019-04-23 MED ORDER — PENNSAID 2 % EX SOLN: 0.0800 g | Freq: Two times a day (BID) | CUTANEOUS | 0 refills | Status: DC | PRN

## 2019-04-23 MED ORDER — FLUTICASONE PROPIONATE 50 MCG/ACT NA SUSP: NASAL | 3 refills | Status: DC

## 2019-04-23 NOTE — Patient Instructions (Signed)
Mariah Henderson - -  Please keep taking all of your medications as prescribed.   Please come back to see me in 3 months.   Please come back for your shoulder injection next week.    Shoulder Range of Motion Exercises Shoulder range of motion (ROM) exercises are done to keep the shoulder moving freely or to increase movement. They are often recommended for people who have shoulder pain or stiffness or who are recovering from a shoulder surgery. Phase 1 exercises When you are able, do this exercise 1-2 times per day for 30-60 seconds in each direction, or as directed by your health care provider. Pendulum exercise To do this exercise while sitting: 1. Sit in a chair or at the edge of your bed with your feet flat on the floor. 2. Let your affected arm hang down in front of you over the edge of the bed or chair. 3. Relax your shoulder, arm, and hand. Hart your body so your arm gently swings in small circles. You can also use your unaffected arm to start the motion. 5. Repeat changing the direction of the circles, swinging your arm left and right, and swinging your arm forward and back. To do this exercise while standing: 1. Stand next to a sturdy chair or table, and hold on to it with your hand on your unaffected side. 2. Bend forward at the waist. 3. Bend your knees slightly. 4. Relax your shoulder, arm, and hand. 5. While keeping your shoulder relaxed, use body motion to swing your arm in small circles. 6. Repeat changing the direction of the circles, swinging your arm left and right, and swinging your arm forward and back. 7. Between exercises, stand up tall and take a short break to relax your lower back.  Phase 2 exercises Do these exercises 1-2 times per day or as told by your health care provider. Hold each stretch for 30 seconds, and repeat 3 times. Do the exercises with one or both arms as instructed by your health care provider. For these exercises, sit at a table with your hand  and arm supported by the table. A chair that slides easily or has wheels can be helpful. External rotation 1. Turn your chair so that your affected side is nearest to the table. 2. Place your forearm on the table to your side. Bend your elbow about 90 at the elbow (right angle) and place your hand palm facing down on the table. Your elbow should be about 6 inches away from your side. 3. Keeping your arm on the table, lean your body forward. Abduction 1. Turn your chair so that your affected side is nearest to the table. 2. Place your forearm and hand on the table so that your thumb points toward the ceiling and your arm is straight out to your side. 3. Slide your hand out to the side and away from you, using your unaffected arm to do the work. 4. To increase the stretch, you can slide your chair away from the table. Flexion: forward stretch 1. Sit facing the table. Place your hand and elbow on the table in front of you. 2. Slide your hand forward and away from you, using your unaffected arm to do the work. 3. To increase the stretch, you can slide your chair backward. Phase 3 exercises Do these exercises 1-2 times per day or as told by your health care provider. Hold each stretch for 30 seconds, and repeat 3 times. Do the exercises with  one or both arms as instructed by your health care provider. Cross-body stretch: posterior capsule stretch 1. Lift your arm straight out in front of you. 2. Bend your arm 90 at the elbow (right angle) so your forearm moves across your body. 3. Use your other arm to gently pull the elbow across your body, toward your other shoulder. Wall climbs 1. Stand with your affected arm extended out to the side with your hand resting on a door frame. 2. Slide your hand slowly up the door frame. 3. To increase the stretch, step through the door frame. Keep your body upright and do not lean. Wand exercises You will need a cane, a piece of PVC pipe, or a sturdy wooden  dowel for wand exercises. Flexion To do this exercise while standing: 1. Hold the wand with both of your hands, palms down. 2. Using the other arm to help, lift your arms up and over your head, if able. 3. Push upward with your other arm to gently increase the stretch. To do this exercise while lying down: 1. Lie on your back with your elbows resting on the floor and the wand in both your hands. Your hands will be palm down, or pointing toward your feet. 2. Lift your hands toward the ceiling, using your unaffected arm to help if needed. 3. Bring your arms overhead as able, using your unaffected arm to help if needed. Internal rotation 1. Stand while holding the wand behind you with both hands. Your unaffected arm should be extended above your head with the arm of the affected side extended behind you at the level of your waist. The wand should be pointing straight up and down as you hold it. 2. Slowly pull the wand up behind your back by straightening the elbow of your unaffected arm and bending the elbow of your affected arm. External rotation 1. Lie on your back with your affected upper arm supported on a small pillow or rolled towel. When you first do this exercise, keep your upper arm close to your body. Over time, bring your arm up to a 90 angle out to the side. 2. Hold the wand across your stomach and with both hands palm up. Your elbow on your affected side should be bent at a 90 angle. 3. Use your unaffected side to help push your forearm away from you and toward the floor. Keep your elbow on your affected side bent at a 90 angle. Contact a health care provider if you have:  New or increasing pain.  New numbness, tingling, weakness, or discoloration in your arm or hand. This information is not intended to replace advice given to you by your health care provider. Make sure you discuss any questions you have with your health care provider. Document Revised: 02/14/2017 Document  Reviewed: 02/14/2017 Elsevier Patient Education  2020 [employer].

## 2019-04-23 NOTE — Progress Notes (Signed)
   Subjective:    [name], female    DOB: April 22, 1950, 69 y.o.   MRN: [id number]  CC: right arm/shoulder pain and swelling  HPI  Mariah Henderson is a 69 year old woman with OA, DM2, h/o headache, glaucoma, depression, allergies/RAD who presents for follow up.  Mariah Henderson reports that she has been having a few weeks of worsening shoulder pain, both lateral and deep in the joint.  It has limited her ability to sleep on that side and move her arm.  The pain feels like "something is catching".  She has been using celebrex without improvement.  She has had Xrays of that shoulder about 2.5 years ago which did show arthritis in the [facility] joint.    She further notes that she has had 2 COVID deaths in the family and due to this she has been eating a little less cautiously, particularly starches and chips.  Her A1C was up above 7.0 today which did cause her some distress.  She is diet controlled at this time and she does not want to go back on medications.    Review of Systems  Constitutional: Positive for activity change. Negative for appetite change, fatigue and fever.  Respiratory: Negative for cough and choking.   Cardiovascular: Negative for chest pain and leg swelling.  Musculoskeletal: Positive for arthralgias, joint swelling and myalgias. Negative for back pain, gait problem, neck pain and neck stiffness.  Skin: Negative for rash and wound.  Neurological: Negative for dizziness and weakness.  Psychiatric/Behavioral: Negative for dysphoric mood and hallucinations.       Objective:   Physical Exam Vitals and nursing note reviewed.  Constitutional:      General: She is not in acute distress.    Appearance: Normal appearance. She is not toxic-appearing.  HENT:     Head: Normocephalic and atraumatic.  Cardiovascular:     Rate and Rhythm: Normal rate and regular rhythm.     Heart sounds: No murmur.  Pulmonary:     Effort: Pulmonary effort is normal. No respiratory distress.    Musculoskeletal:        General: Tenderness present. No swelling.     Comments: She has abnormal range of motion of the right arm for abduction and internal rotation.   Neurological:     General: No focal deficit present.     Mental Status: She is alert and oriented to person, place, and time.     Motor: No weakness.  Psychiatric:        Mood and Affect: Mood normal.        Behavior: Behavior normal.     BMET today.  A1C is 7.3      Assessment & Plan:  Return next week for shoulder injection and in 3 months for DM follow up.

## 2019-04-24 ENCOUNTER — Telehealth: Payer: Self-pay | Admitting: *Deleted

## 2019-04-24 ENCOUNTER — Telehealth: Payer: Self-pay | Admitting: Internal Medicine

## 2019-04-24 NOTE — Telephone Encounter (Signed)
TC to [facility], spoke with pharmacist who states this medication is not available in generic and needs a PA.  Pharmacist states PA was sent to office yesterday. Will forward to [employer], RN,BSN

## 2019-04-24 NOTE — Telephone Encounter (Signed)
Call to [facility] and faxed information for Pennsaid 2% Gel.  Awaiting determination.  Sander Nephew, RN 04/24/2019 4:09 PM.

## 2019-04-24 NOTE — Assessment & Plan Note (Signed)
NSAIDs are no longer working for her pain.  Currently with a flare of pain.    Trial of diclofenac solution.  Plan for shoulder injection on 4/15.

## 2019-04-24 NOTE — Assessment & Plan Note (Signed)
Unfortunately, due to the pandemic, she has not been able to follow up with GI.  She notes that she continues to have symptoms, and they have not worsened.  Will discuss consult with GI when she comes back for her shoulder injection.

## 2019-04-24 NOTE — Telephone Encounter (Signed)
Pt seen on 04/23/2019 and  went to the pharmacy to pick up her medication PEr Patient she has  switched insurances now and the cost now will be over $2,000.00.    Pt's arm is in a lot of pain and wants to know what to do or what she can take instead.  Please call the patient back.   Refill Request   Diclofenac Sodium (PENNSAID) 2 % SOLN  [facility] [id number] - [place], [place] - [address] AT [address]

## 2019-04-24 NOTE — Assessment & Plan Note (Addendum)
No acute issues today, but she does continue to have dysphagia at times, worse with foods that are difficult to chew like salads and crunchy things.  She is taking pepcid without issue.   Unfortunately, due to the pandemic, she has not been able to follow up with GI.  She notes that she continues to have symptoms, and they have not worsened.  Will discuss consult with GI when she comes back for her shoulder injection.

## 2019-04-24 NOTE — Assessment & Plan Note (Signed)
Continue loratadine 

## 2019-04-24 NOTE — Assessment & Plan Note (Signed)
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  She is having a flare of disease at this time.  She was unaware of her diagnoses today.  She may need screening for MCI or early dementia (unrelated to shoulder pain).  We discussed a steroid injection that may help with the arthritis pain.  She is now > 2 years out from tear, so she may no longer be a surgical candidate.  Will plan to further discuss when she returns for shoulder injection next week.  Sent in a refill for diclofenac solution.

## 2019-04-24 NOTE — Assessment & Plan Note (Signed)
BP today is mildly elevated at 145/75.  She notes not taking her medications yet this morning.  She has been controlled on this regimen in the past.  Will plan to check BMET today to evaluate her renal function.  Advised she take her medications when she returns home.  She is also in pain today which may make interpretation of her BP more difficult.   Plan Continue benazepril, hctz Check BMET today.

## 2019-04-24 NOTE — Assessment & Plan Note (Signed)
Requested refill of flonase today.  Now that spring is coming, she is having more rhinitis and feels that this medication helps.   Plan Refill Flonase Monitor for worsening.

## 2019-04-24 NOTE — Assessment & Plan Note (Signed)
She continues to have some pain in the neck and now pain in the shoulder.  I think the shoulder is related to [facility] arthritis.  She is taking gabapentin without issue.  See separate problem for plan for shoulder pain.

## 2019-04-24 NOTE — Assessment & Plan Note (Signed)
She is doing well, taking inhalers without issue.  Lungs are clear today.   Plan Continue Advair and PRN albuterol

## 2019-04-24 NOTE — Assessment & Plan Note (Addendum)
Her A1C is elevated today.  She has increased her starches and sugars given some adjustment to 2 deaths in the family and the pandemic.  She is committed to getting back on her diet and having her DM controlled that way.  She follows with ophthalmology closely given her diagnosis of glaucoma.  BMET today for renal function.

## 2019-04-24 NOTE — Assessment & Plan Note (Signed)
She is on a bisphosphonate.  DEXA is from 2020.  Would consider repeating DEXA in 2022.

## 2019-04-24 NOTE — Assessment & Plan Note (Signed)
Follows with psychiatry.  She is on medications from that provider and stable in her mood today.

## 2019-04-24 NOTE — Telephone Encounter (Signed)
Thank you!  That would be great if we can get it for her.

## 2019-04-25 ENCOUNTER — Ambulatory Visit: Payer: Medicare Other | Admitting: Internal Medicine

## 2019-04-30 NOTE — Telephone Encounter (Signed)
See Regino Schultze Herbin's telephone encounter 3/8.

## 2019-05-01 ENCOUNTER — Ambulatory Visit (INDEPENDENT_AMBULATORY_CARE_PROVIDER_SITE_OTHER): Payer: Medicare Other | Admitting: Internal Medicine

## 2019-05-01 ENCOUNTER — Other Ambulatory Visit: Payer: Self-pay

## 2019-05-01 VITALS — BP 141/67 | HR 72 | Temp 98.3°F | Ht 60.0 in | Wt 173.4 lb

## 2019-05-01 DIAGNOSIS — M25511 Pain in right shoulder: Secondary | ICD-10-CM | POA: Diagnosis not present

## 2019-05-01 DIAGNOSIS — G8929 Other chronic pain: Secondary | ICD-10-CM | POA: Diagnosis not present

## 2019-05-01 MED ORDER — DICLOFENAC SODIUM 1 % EX GEL: 2.0000 g | Freq: Four times a day (QID) | CUTANEOUS | 2 refills | Status: DC

## 2019-05-01 NOTE — Patient Instructions (Signed)
Mariah Henderson, let us know how the joint injection helped you.  If necessary we can try a different site of injection to help relieve your pain further.

## 2019-05-01 NOTE — Progress Notes (Signed)
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 Consent was obtained for the procedure. The shoulder was prepped with iodine. Using a 22 gauge needle the subacromial bursa was injected with 2 mL 1% lidocaine and 1 mL of triamcinolone (KENALOG) 40mg /ml. The needle was removed and a dressing was applied.  Complications:  None; patient tolerated the procedure well.

## 2019-05-01 NOTE — Progress Notes (Signed)
CC: Right Shoulder pain  HPI:  [name] is a 69 y.o. female with PMH below.  Today we will address Right Shoulder pain  Please see A&P for status of the patient's chronic medical conditions  Past Medical History:  Diagnosis Date  . Acute medial meniscus tear    right knee  . Anxiety   . Asthma   . Cervical spondylosis   . Chronic serous otitis media   . Constipation   . COPD (chronic obstructive pulmonary disease) ([place])   . Depression    followed by [name]; no longer of depakote, seroquel  . Dysuria 12/27/2009   Qualifier: Diagnosis of  By: [name] MD, Amanjot    . Foot drop   . GERD (gastroesophageal reflux disease)   . Heart murmur   . Hyperlipidemia   . Hypertension   . Low back pain   . Obesity   . Paraumbilical hernia   . Pre-diabetes    Review of Systems:  ROS: Pulmonary: pt denies increased work of breathing, shortness of breath,  Cardiac: pt denies palpitations, chest pain,  Abdominal: pt denies abdominal pain, nausea, vomiting, or diarrhea   Physical Exam:  Vitals:   05/01/19 1358  BP: (!) 141/67  Pulse: 72  Temp: 98.3 F (36.8 C)  TempSrc: Oral  SpO2: 99%  Weight: 173 lb 6.4 oz (78.7 kg)  Height: 5' (1.524 m)   Cardiac:  normal rate and rhythm, clear s1 and s2, no murmurs, rubs or gallops, Pulmonary: CTAB, not in distress Psych: Alert, conversant, in good spirits MSK: pain with passive and active ROM of right shoulder, no visible asymmetry compared with left.    Social History   Socioeconomic History  . Marital status: Divorced    Spouse name: Not on file  . Number of children: Not on file  . Years of education: Not on file  . Highest education level: Not on file  Occupational History  . Not on file  Tobacco Use  . Smoking status: Former Smoker    Types: Cigarettes    Quit date: 01/17/1996    Years since quitting: 23.3  . Smokeless tobacco: Never Used  Substance and Sexual Activity  . Alcohol use: No    Alcohol/week:  0.0 standard drinks  . Drug use: No  . Sexual activity: Not Currently  Other Topics Concern  . Not on file  Social History Narrative  . Not on file   Social Determinants of Health   Financial Resource Strain:   . Difficulty of Paying Living Expenses:   Food Insecurity:   . Worried About Charity fundraiser in the Last Year:   . Arboriculturist in the Last Year:   Transportation Needs:   . Film/video editor (Medical):   Marland Kitchen Lack of Transportation (Non-Medical):   Physical Activity:   . Days of Exercise per Week:   . Minutes of Exercise per Session:   Stress:   . Feeling of Stress :   Social Connections:   . Frequency of Communication with Friends and Family:   . Frequency of Social Gatherings with Friends and Family:   . Attends Religious Services:   . Active Member of Clubs or Organizations:   . Attends Archivist Meetings:   Marland Kitchen Marital Status:   Intimate Partner Violence:   . Fear of Current or Ex-Partner:   . Emotionally Abused:   Marland Kitchen Physically Abused:   . Sexually Abused:     Family History  Problem Relation Age of Onset  . Colon cancer Neg Hx     Assessment & Plan:   See Encounters Tab for problem based charting.  Patient seen with Dr. Evette Doffing

## 2019-05-01 NOTE — Assessment & Plan Note (Signed)
Follow up visit for right shoulder pain that is persistent despite oral pain relievers and physical therapy.  History of rotator cuff tear in the past as well as osteoarthritis.  Pt consented and agreeable to [facility] joint injection today.  See separate note.

## 2019-05-02 ENCOUNTER — Telehealth: Payer: Self-pay | Admitting: *Deleted

## 2019-05-02 NOTE — Telephone Encounter (Signed)
Patient called in concerned that her AM fasting blood sugar today is 207. She received a cortisone injection yesterday in right shoulder and was told by Attending to expect an elevated blood sugar but she is still concerned. She usually ranges 109-152. She states she no longer takes meds for diabetes. She would like a call back to discuss this. Hubbard Hartshorn, BSN, RN-BC

## 2019-05-02 NOTE — Telephone Encounter (Signed)
Called pt and reassured her  that this is nothing to worry about.  Some but not all patient's get a small increase in cbg value the day after injection.  She says come to think of it it could have been the ice cream cone she had before bed.  She also wanted to discuss the diabetic diet which we did in broad overview.

## 2019-05-02 NOTE — Telephone Encounter (Signed)
I will call the patient and discuss that this is nothing to worry about

## 2019-05-05 NOTE — Progress Notes (Signed)
Internal Medicine Clinic Attending  I saw and evaluated the patient.  I personally confirmed the key portions of the history and exam documented by Dr. Shan Levans and I reviewed pertinent patient test results.  I was present for the entirety of the procedure.

## 2019-05-12 ENCOUNTER — Other Ambulatory Visit: Payer: Self-pay | Admitting: Internal Medicine

## 2019-05-12 DIAGNOSIS — I10 Essential (primary) hypertension: Secondary | ICD-10-CM

## 2019-06-22 ENCOUNTER — Other Ambulatory Visit: Payer: Self-pay | Admitting: Internal Medicine

## 2019-06-22 DIAGNOSIS — M159 Polyosteoarthritis, unspecified: Secondary | ICD-10-CM

## 2019-06-22 DIAGNOSIS — G8929 Other chronic pain: Secondary | ICD-10-CM

## 2019-06-22 DIAGNOSIS — M8949 Other hypertrophic osteoarthropathy, multiple sites: Secondary | ICD-10-CM

## 2019-07-16 ENCOUNTER — Other Ambulatory Visit: Payer: Self-pay | Admitting: Internal Medicine

## 2019-07-16 DIAGNOSIS — R42 Dizziness and giddiness: Secondary | ICD-10-CM

## 2019-07-16 MED ORDER — MECLIZINE HCL 12.5 MG PO TABS: ORAL_TABLET | ORAL | 3 refills | Status: DC

## 2019-07-16 NOTE — Telephone Encounter (Signed)
Refill Request  meclizine (ANTIVERT) 12.5 MG tablet  [id number] - [place], [address] AT [address]

## 2019-07-20 ENCOUNTER — Other Ambulatory Visit: Payer: Self-pay | Admitting: Internal Medicine

## 2019-07-23 DIAGNOSIS — H40013 Open angle with borderline findings, low risk, bilateral: Secondary | ICD-10-CM | POA: Diagnosis not present

## 2019-07-23 DIAGNOSIS — H25813 Combined forms of age-related cataract, bilateral: Secondary | ICD-10-CM | POA: Diagnosis not present

## 2019-07-23 DIAGNOSIS — E119 Type 2 diabetes mellitus without complications: Secondary | ICD-10-CM | POA: Diagnosis not present

## 2019-07-23 LAB — HM DIABETES EYE EXAM

## 2019-08-06 ENCOUNTER — Other Ambulatory Visit: Payer: Self-pay | Admitting: Internal Medicine

## 2019-08-06 NOTE — Addendum Note (Signed)
Addended by: Gilles Chiquito B on: 08/06/2019 10:24 AM   Modules accepted: Orders

## 2019-08-10 ENCOUNTER — Other Ambulatory Visit: Payer: Self-pay | Admitting: Internal Medicine

## 2019-08-10 DIAGNOSIS — E119 Type 2 diabetes mellitus without complications: Secondary | ICD-10-CM

## 2019-08-11 ENCOUNTER — Other Ambulatory Visit: Payer: Self-pay | Admitting: Internal Medicine

## 2019-08-11 NOTE — Telephone Encounter (Signed)
Need refill on glucose blood (ONETOUCH ULTRA) test strip ;pt contact [phone]  [facility] [phone] - [place], [address] AT [address]  Pt is completely out of test strip

## 2019-08-11 NOTE — Telephone Encounter (Signed)
Electronic refill request sent to pcp this this morning and pending review.  Will close this encounter as a duplicate request.Dollye Glasser Cassady7/26/20219:27 AM

## 2019-08-15 ENCOUNTER — Encounter: Payer: Self-pay | Admitting: *Deleted

## 2019-08-29 ENCOUNTER — Encounter: Payer: Medicare Other | Admitting: Internal Medicine

## 2019-09-05 ENCOUNTER — Telehealth: Payer: Self-pay | Admitting: Internal Medicine

## 2019-09-05 ENCOUNTER — Other Ambulatory Visit: Payer: Self-pay

## 2019-09-05 ENCOUNTER — Ambulatory Visit (INDEPENDENT_AMBULATORY_CARE_PROVIDER_SITE_OTHER): Payer: Medicare Other | Admitting: Internal Medicine

## 2019-09-05 ENCOUNTER — Encounter: Payer: Self-pay | Admitting: Internal Medicine

## 2019-09-05 VITALS — BP 121/80 | HR 72 | Temp 98.6°F | Ht 60.0 in | Wt 162.0 lb

## 2019-09-05 DIAGNOSIS — R300 Dysuria: Secondary | ICD-10-CM | POA: Diagnosis not present

## 2019-09-05 DIAGNOSIS — M75101 Unspecified rotator cuff tear or rupture of right shoulder, not specified as traumatic: Secondary | ICD-10-CM | POA: Diagnosis not present

## 2019-09-05 DIAGNOSIS — I10 Essential (primary) hypertension: Secondary | ICD-10-CM | POA: Diagnosis not present

## 2019-09-05 DIAGNOSIS — R1310 Dysphagia, unspecified: Secondary | ICD-10-CM

## 2019-09-05 DIAGNOSIS — Z6831 Body mass index (BMI) 31.0-31.9, adult: Secondary | ICD-10-CM

## 2019-09-05 DIAGNOSIS — E1139 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: Secondary | ICD-10-CM

## 2019-09-05 DIAGNOSIS — R42 Dizziness and giddiness: Secondary | ICD-10-CM

## 2019-09-05 DIAGNOSIS — E669 Obesity, unspecified: Secondary | ICD-10-CM

## 2019-09-05 DIAGNOSIS — M67911 Unspecified disorder of synovium and tendon, right shoulder: Secondary | ICD-10-CM

## 2019-09-05 LAB — POCT URINALYSIS DIPSTICK
Blood, UA: NEGATIVE
Glucose, UA: NEGATIVE
Leukocytes, UA: NEGATIVE
Nitrite, UA: NEGATIVE
Protein, UA: NEGATIVE
Spec Grav, UA: >=1.03 — AB (ref 1.010–1.025)
Urobilinogen, UA: 1 E.U./dL
pH, UA: 5 (ref 5.0–8.0)

## 2019-09-05 LAB — POCT GLYCOSYLATED HEMOGLOBIN (HGB A1C): Hemoglobin A1C: 8 % — AB (ref 4.0–5.6)

## 2019-09-05 LAB — GLUCOSE, CAPILLARY: Glucose-Capillary: 169 mg/dL — ABNORMAL HIGH (ref 70–99)

## 2019-09-05 MED ORDER — METFORMIN HCL ER 500 MG PO TB24: ORAL_TABLET | ORAL | 2 refills | Status: DC

## 2019-09-05 MED ORDER — DICLOFENAC SODIUM 1 % EX GEL: 2.0000 g | Freq: Four times a day (QID) | CUTANEOUS | 2 refills | Status: DC

## 2019-09-05 MED ORDER — MECLIZINE HCL 12.5 MG PO TABS: ORAL_TABLET | ORAL | 3 refills | Status: DC

## 2019-09-05 NOTE — Patient Instructions (Addendum)
Mariah Henderson - -   You are doing well!  Your blood sugar is elevated again today (A1C of 8.0).  I think we will need to start you on medication.   Please take    Metformin 500mg  once daily for 1 week then increase to 500mg  twice a day.    Come back to see me in 3 months.

## 2019-09-05 NOTE — Assessment & Plan Note (Signed)
She has successfully lost weight and BMI is now 31.64.  Encouraged her to continue changing and improving her diet.

## 2019-09-05 NOTE — Telephone Encounter (Signed)
Return pt's call - stated she forgot to tell Dr Daryll Drown about her legs. Stated her legs feel tired; like she has been "walking for months". Stated if we call back, to leave a message on her son,Norman, telephone b/c she's going out of town  to help take care of her brother for a month. Son's # [phone].

## 2019-09-05 NOTE — Assessment & Plan Note (Signed)
She continues to have issues with some solid foods, like salad.  However, she is able to eventually swallow these.  She is not interested in GI referral at this time given she is due to take care of her brother.  Her symptoms are stable and she would prefer to wait until next visit.

## 2019-09-05 NOTE — Assessment & Plan Note (Signed)
We will place a referral to her previous orthopedic provider, [facility].  Refill voltaren gel.   Plan Follow up with orthopedics She is not interested in further injection therapy.

## 2019-09-05 NOTE — Telephone Encounter (Signed)
Pt seen today by Dr. Daryll Drown. Requesting a call back about her legs.  Pt asked that you contact her (Temporaily due to cell Phone being Dead) @ [phone].

## 2019-09-05 NOTE — Assessment & Plan Note (Signed)
BP today is 121/80 which is well controlled for her.   She will continue her benazapril, furosemide and hctz.

## 2019-09-05 NOTE — Progress Notes (Signed)
Subjective:    [name], female    DOB: 02-Sep-1950, 69 y.o.   MRN: [id number]  CC: Follow up for DM2 and HTN.  Needs a new orthopedics referral for her shoulder  HPI  Mariah Henderson is a 69 year old woman with PMH of DM2, HTN, Dysphagia, chronic OA and history of full thickness tear of the shoulder.  She had an injection of the shoulder back in April of this year.  Today she reports that she is doing okay, but feels like her sugars are high due to the cortisone shot.  She has been cutting back on carbs and other sugary foods and has successfully lost 11 pounds.  However, her A1C remained high today at 8.0.  We will restart metformin which we discussed.   She would like to go back to see orthopedic for her shoulder (she has seen [facility]).  She needs a new referral.    She is due to see her brother for the next month and help him recover from a TKR.  She needs refills on voltaren and meclizine.   We did a mini-cog today and she scored 3, worst score for recall - -only recalled 1 word of 3.   At the end of the visit, she noted some dysuria with a strong odor to the urine.  She has had decreased PO intake and fluid intake due to elevated sugars.  POC UA did not show an infection.  Did appear concentrated.  Advised her to increase fluid intake and take metformin.  Will re-assess at next visit.    Review of Systems  Constitutional: Negative for activity change, appetite change, fatigue and fever.  HENT: Negative for congestion.        She is having increased dry mouth and dry lips.    Respiratory: Negative for cough and shortness of breath.   Cardiovascular: Negative for chest pain, palpitations and leg swelling.  Gastrointestinal: Negative for abdominal pain, diarrhea and nausea.  Genitourinary: Positive for dysuria (burning). Negative for difficulty urinating, frequency and hematuria.  Musculoskeletal: Positive for arthralgias, back pain, joint swelling, myalgias and neck  pain. Negative for gait problem and neck stiffness.  Skin: Positive for color change (to lips).  Neurological: Positive for dizziness (chronic, related to vertigo).       Memory changes  Psychiatric/Behavioral: Negative for decreased concentration and dysphoric mood.       Objective:   Physical Exam Vitals and nursing note reviewed.  Constitutional:      General: She is not in acute distress.    Appearance: She is obese. She is not ill-appearing, toxic-appearing or diaphoretic.  HENT:     Head: Normocephalic and atraumatic.     Mouth/Throat:     Mouth: Mucous membranes are dry.     Pharynx: No oropharyngeal exudate.     Comments: Lips are dry with an area of dryness below lower lip.  Eyes:     General: No scleral icterus.       Right eye: No discharge.        Left eye: No discharge.  Cardiovascular:     Rate and Rhythm: Normal rate and regular rhythm.     Heart sounds: No murmur heard.   Pulmonary:     Effort: Pulmonary effort is normal. No respiratory distress.  Musculoskeletal:        General: Deformity (She has an enlarged clavicle on the right due to [facility] joint arthritis) present.  Right lower leg: No edema.     Left lower leg: No edema.     Comments: ROM of shoulders is decreased, particularly internal rotation.  Strength is 5/5 in bilateral grip, extension and flexion at the elbow.   Skin:    General: Skin is warm and dry.     Findings: No lesion or rash.  Neurological:     General: No focal deficit present.     Mental Status: She is alert. Mental status is at baseline.  Psychiatric:        Mood and Affect: Mood normal.        Behavior: Behavior normal.   Mini Cog 3/5  A1C 8.0  BMET pending      Assessment & Plan:  RTC in 3 months, sooner if needed.

## 2019-09-05 NOTE — Assessment & Plan Note (Signed)
Refill meclizine today.  This is a chronic issue for which she has taken PRN meclizine for a long time.  She has no adverse effects from the medication reported today and is on a low dose.

## 2019-09-05 NOTE — Assessment & Plan Note (Signed)
Unfortunately, her A1C is higher today, which she relates to the steroid injection in her shoulder.  Her A1C is 8 today.  We will plan to start metformin.  She has successfully lost weight and I encouraged her in this.  She is trying to lose some weight and decrease her blood sugars.  She has good BP control at this time.    Plan Start Metformin XR 500mg  once per day for 7 days then increase to BID Check BMET today Return in 3 months for further evaluation Pill cutter provided to her today.

## 2019-09-06 LAB — BMP8+ANION GAP
Anion Gap: 13 mmol/L (ref 10.0–18.0)
BUN/Creatinine Ratio: 14 (ref 12–28)
BUN: 11 mg/dL (ref 8–27)
CO2: 27 mmol/L (ref 20–29)
Calcium: 9.7 mg/dL (ref 8.7–10.3)
Chloride: 101 mmol/L (ref 96–106)
Creatinine, Ser: 0.8 mg/dL (ref 0.57–1.00)
GFR calc Af Amer: 86 mL/min/{1.73_m2} (ref >59)
GFR calc non Af Amer: 74 mL/min/{1.73_m2} (ref >59)
Glucose: 170 mg/dL — ABNORMAL HIGH (ref 65–99)
Potassium: 4.6 mmol/L (ref 3.5–5.2)
Sodium: 141 mmol/L (ref 134–144)

## 2019-09-09 NOTE — Telephone Encounter (Signed)
Pt requested that a message be left on son's cell phone at [phone] because she was going out of town, previous note states her cell phone was dead.  This RN called son's cell phone and VM obtained that number was associated with a janitorial service.  RN unable to leave message due to [place].   Will need to attempt to call patient later in the week once she gets to her destination and has time to charge her cell phone. SChaplin, RN,BSN

## 2019-09-09 NOTE — Telephone Encounter (Signed)
I am on inpatient, so would offer her a telehealth visit with a resident in the interim to address.  Or she can have a telehealth visit with me in September.   Thank you!

## 2019-09-16 NOTE — Telephone Encounter (Signed)
Called [phone], identified as a janitorial services vm; no message left I called Ms Winkels's phone # - left message on self- identified vm to call the office.

## 2019-09-19 ENCOUNTER — Telehealth: Payer: Self-pay | Admitting: *Deleted

## 2019-09-19 NOTE — Telephone Encounter (Signed)
LVM FOR PATIENT TO CALL [facility] TO SET UP HER APPOINTMENT, OFFICE HAD TRIED TO CONTACT HER THEY LVM.

## 2019-09-23 ENCOUNTER — Encounter: Payer: Self-pay | Admitting: Internal Medicine

## 2019-09-23 ENCOUNTER — Telehealth: Payer: Self-pay | Admitting: *Deleted

## 2019-09-23 NOTE — Telephone Encounter (Signed)
CALLED PATIENT . LVM FOR PATIENT TO CALL [facility] TO SET UP HER APPOINTMENT @ [phone]. PER OFFICE HAVE CALLED AND LEFT VOICE MESSAGE FOR PATIENT TO RETURN CALL.

## 2019-09-24 ENCOUNTER — Encounter: Payer: Self-pay | Admitting: *Deleted

## 2019-10-10 DIAGNOSIS — M79644 Pain in right finger(s): Secondary | ICD-10-CM | POA: Diagnosis not present

## 2019-10-10 DIAGNOSIS — M75121 Complete rotator cuff tear or rupture of right shoulder, not specified as traumatic: Secondary | ICD-10-CM | POA: Diagnosis not present

## 2019-10-13 ENCOUNTER — Other Ambulatory Visit: Payer: Self-pay | Admitting: Internal Medicine

## 2019-10-13 ENCOUNTER — Other Ambulatory Visit: Payer: Self-pay | Admitting: Sports Medicine

## 2019-10-16 DIAGNOSIS — Z1231 Encounter for screening mammogram for malignant neoplasm of breast: Secondary | ICD-10-CM | POA: Diagnosis not present

## 2019-10-16 DIAGNOSIS — Z803 Family history of malignant neoplasm of breast: Secondary | ICD-10-CM | POA: Diagnosis not present

## 2019-11-05 ENCOUNTER — Telehealth: Payer: Self-pay | Admitting: *Deleted

## 2019-11-05 NOTE — Telephone Encounter (Signed)
Patient called in requesting alternate med from metformin. States she was given 90 tabs and has 2 left. Does not want to get this refilled. States it causes her "stomach to hurt real bad and chest pain." Denies CP at present. States it's only when she takes the metformin. Also, it causes "a lot of gas." Denies N/V/D. Has been taking metformin BID. Please advise. Hubbard Hartshorn, BSN, RN-BC

## 2019-11-06 MED ORDER — SITAGLIPTIN PHOSPHATE 25 MG PO TABS: 25.0000 mg | ORAL_TABLET | Freq: Every day | ORAL | 1 refills | Status: DC

## 2019-11-06 NOTE — Telephone Encounter (Signed)
Patient notified to pick up Januvia and stop metformin. She is very Patent attorney. Hubbard Hartshorn, BSN, RN-BC

## 2019-11-06 NOTE — Telephone Encounter (Signed)
I will send in an Rx for Januvia 25mg  daily.  Please let her know?  Thank you!

## 2019-11-12 ENCOUNTER — Encounter: Payer: Medicare Other | Admitting: Student

## 2019-11-13 ENCOUNTER — Ambulatory Visit (INDEPENDENT_AMBULATORY_CARE_PROVIDER_SITE_OTHER): Payer: Medicare Other | Admitting: Student

## 2019-11-13 ENCOUNTER — Encounter: Payer: Self-pay | Admitting: Student

## 2019-11-13 ENCOUNTER — Other Ambulatory Visit: Payer: Self-pay

## 2019-11-13 VITALS — BP 128/72 | HR 72 | Temp 98.2°F | Wt 153.7 lb

## 2019-11-13 DIAGNOSIS — E1139 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: Secondary | ICD-10-CM

## 2019-11-13 DIAGNOSIS — G4452 New daily persistent headache (NDPH): Secondary | ICD-10-CM | POA: Diagnosis not present

## 2019-11-13 DIAGNOSIS — R1310 Dysphagia, unspecified: Secondary | ICD-10-CM

## 2019-11-13 DIAGNOSIS — R42 Dizziness and giddiness: Secondary | ICD-10-CM | POA: Diagnosis not present

## 2019-11-13 DIAGNOSIS — R300 Dysuria: Secondary | ICD-10-CM

## 2019-11-13 DIAGNOSIS — R630 Anorexia: Secondary | ICD-10-CM

## 2019-11-13 DIAGNOSIS — Z0000 Encounter for general adult medical examination without abnormal findings: Secondary | ICD-10-CM

## 2019-11-13 LAB — POCT URINALYSIS DIPSTICK
Bilirubin, UA: NEGATIVE
Blood, UA: NEGATIVE
Glucose, UA: NEGATIVE
Ketones, UA: NEGATIVE
Leukocytes, UA: NEGATIVE
Nitrite, UA: NEGATIVE
Protein, UA: NEGATIVE
Spec Grav, UA: 1.025 (ref 1.010–1.025)
Urobilinogen, UA: 1 E.U./dL
pH, UA: 6 (ref 5.0–8.0)

## 2019-11-13 MED ORDER — MECLIZINE HCL 12.5 MG PO TABS: ORAL_TABLET | ORAL | 3 refills | Status: DC

## 2019-11-13 MED ORDER — FAMOTIDINE 40 MG PO TABS: ORAL_TABLET | ORAL | 3 refills | Status: DC

## 2019-11-13 NOTE — Patient Instructions (Addendum)
It was a pleasure seeing you in clinic. Today we discussed:   Loss of appetite: There are many causes of loss of appetite and weight loss. I have ordered lab work and a barium study to evaluate this and will call you with the results.  Pain with urination: Your urine looked normal today. We will revisit  if this continues to bother you.   If you have any questions or concerns, please call our clinic at [phone] between 9am-5pm and after hours call [phone] and ask for the internal medicine resident on call. If you feel you are having a medical emergency please call 911.   Thank you, we look forward to helping you remain healthy!

## 2019-11-14 DIAGNOSIS — R630 Anorexia: Secondary | ICD-10-CM | POA: Insufficient documentation

## 2019-11-14 LAB — CMP14 + ANION GAP
ALT: 25 IU/L (ref 0–32)
AST: 30 IU/L (ref 0–40)
Albumin/Globulin Ratio: 1.7 (ref 1.2–2.2)
Albumin: 4 g/dL (ref 3.7–4.7)
Alkaline Phosphatase: 122 IU/L — ABNORMAL HIGH (ref 44–121)
Anion Gap: 12 mmol/L (ref 10.0–18.0)
BUN/Creatinine Ratio: 14 (ref 12–28)
BUN: 10 mg/dL (ref 8–27)
Bilirubin Total: 0.3 mg/dL (ref 0.0–1.2)
CO2: 28 mmol/L (ref 20–29)
Calcium: 9.6 mg/dL (ref 8.7–10.3)
Chloride: 101 mmol/L (ref 96–106)
Creatinine, Ser: 0.74 mg/dL (ref 0.57–1.00)
GFR calc Af Amer: 94 mL/min/{1.73_m2} (ref >59)
GFR calc non Af Amer: 82 mL/min/{1.73_m2} (ref >59)
Globulin, Total: 2.4 g/dL (ref 1.5–4.5)
Glucose: 92 mg/dL (ref 65–99)
Potassium: 4.2 mmol/L (ref 3.5–5.2)
Sodium: 141 mmol/L (ref 134–144)
Total Protein: 6.4 g/dL (ref 6.0–8.5)

## 2019-11-14 LAB — CBC WITH DIFFERENTIAL/PLATELET
Basophils Absolute: 0 10*3/uL (ref 0.0–0.2)
Basos: 1 %
EOS (ABSOLUTE): 0.3 10*3/uL (ref 0.0–0.4)
Eos: 5 %
Hematocrit: 39.4 % (ref 34.0–46.6)
Hemoglobin: 12.8 g/dL (ref 11.1–15.9)
Immature Grans (Abs): 0 10*3/uL (ref 0.0–0.1)
Immature Granulocytes: 0 %
Lymphocytes Absolute: 0.5 10*3/uL — ABNORMAL LOW (ref 0.7–3.1)
Lymphs: 8 %
MCH: 27.6 pg (ref 26.6–33.0)
MCHC: 32.5 g/dL (ref 31.5–35.7)
MCV: 85 fL (ref 79–97)
Monocytes Absolute: 0.5 10*3/uL (ref 0.1–0.9)
Monocytes: 9 %
Neutrophils Absolute: 4.3 10*3/uL (ref 1.4–7.0)
Neutrophils: 77 %
Platelets: 279 10*3/uL (ref 150–450)
RBC: 4.64 x10E6/uL (ref 3.77–5.28)
RDW: 12 % (ref 11.7–15.4)
WBC: 5.6 10*3/uL (ref 3.4–10.8)

## 2019-11-14 LAB — TSH: TSH: 0.54 u[IU]/mL (ref 0.450–4.500)

## 2019-11-14 NOTE — Progress Notes (Signed)
CC: loss of appetite   HPI:  [name] is a 69 y.o.   Past Medical History:  Diagnosis Date  . Acute medial meniscus tear    right knee  . Anxiety   . Asthma   . Cervical spondylosis   . Chronic serous otitis media   . Constipation   . COPD (chronic obstructive pulmonary disease) ([place])   . Depression    followed by [name]; no longer of depakote, seroquel  . Dysuria 12/27/2009   Qualifier: Diagnosis of  By: [name] MD, Amanjot    . Foot drop   . GERD (gastroesophageal reflux disease)   . Heart murmur   . Hyperlipidemia   . Hypertension   . Low back pain   . Obesity   . Paraumbilical hernia   . Pre-diabetes    Review of Systems: Review of Systems  Constitutional: Positive for weight loss. Negative for chills, diaphoresis and fever.  HENT: Negative for sinus pain and sore throat.        Dysphagia  Eyes: Negative for blurred vision.  Respiratory: Negative for cough, hemoptysis and shortness of breath.   Cardiovascular: Negative for chest pain, palpitations and leg swelling.  Gastrointestinal: Positive for nausea. Negative for abdominal pain, blood in stool, constipation, diarrhea and melena.       Early satiety   Genitourinary: Positive for dysuria. Negative for frequency and urgency.  Musculoskeletal: Negative for myalgias.  Skin: Negative for rash.  Neurological: Positive for dizziness. Negative for sensory change, weakness and headaches.  Psychiatric/Behavioral: Negative for depression and suicidal ideas. The patient is nervous/anxious.      Physical Exam:  Vitals:   11/13/19 1455  BP: 128/72  Pulse: 72  Temp: 98.2 F (36.8 C)  TempSrc: Oral  SpO2: 99%  Weight: 153 lb 11.2 oz (69.7 kg)   Physical Exam Constitutional:      Appearance: Normal appearance. She is not ill-appearing.     Comments: Clothing is baggy  HENT:     Head: Normocephalic and atraumatic.     Right Ear: External ear normal.     Left Ear: External ear normal.     Nose:  Nose normal.     Mouth/Throat:     Mouth: Mucous membranes are moist.     Pharynx: Oropharynx is clear.  Eyes:     Extraocular Movements: Extraocular movements intact.     Pupils: Pupils are equal, round, and reactive to light.  Cardiovascular:     Rate and Rhythm: Normal rate and regular rhythm.     Pulses: Normal pulses.     Heart sounds: Normal heart sounds.  Pulmonary:     Effort: Pulmonary effort is normal.     Breath sounds: Normal breath sounds.  Abdominal:     General: Abdomen is flat. Bowel sounds are normal. There is no distension.     Palpations: Abdomen is soft. There is no mass.     Tenderness: There is no guarding or rebound.  Musculoskeletal:        General: No swelling. Normal range of motion.     Cervical back: Normal range of motion and neck supple.     Right lower leg: No edema.     Left lower leg: No edema.  Skin:    General: Skin is warm and dry.     Capillary Refill: Capillary refill takes less than 2 seconds.     Coloration: Skin is not jaundiced.  Neurological:     General: No  focal deficit present.     Mental Status: She is alert and oriented to person, place, and time. Mental status is at baseline.  Psychiatric:        Mood and Affect: Mood normal.        Behavior: Behavior normal.      Assessment & Plan:   See Encounters Tab for problem based charting.  Patient seen with Dr. Jimmye Norman

## 2019-11-14 NOTE — Addendum Note (Signed)
Addended by: Iona Beard on: 11/14/2019 01:34 PM   Modules accepted: Level of Service

## 2019-11-14 NOTE — Assessment & Plan Note (Signed)
She continues to have issue with dysphagia with solid food and feel they become stuck in throat for a long time before she is able to swallow. Has no issues with liquids. She was busy with family matters previously but would like for this to be evaluated by GI at this time.   Plan Barium esophogram  Referral to GI after barium study

## 2019-11-14 NOTE — Assessment & Plan Note (Signed)
She reports dysuria for the past 2 weeks. Denies fever, chills, hematuria, frequency, or urgency. Urine dipstick without abnormalities. Appears she has had this problem several years ago. Consider evaluating for vaginal atrophy if this persists.

## 2019-11-14 NOTE — Assessment & Plan Note (Signed)
Patient no longer taking metformin as it was causing abdominal pain and gas. Now on Januvia 25mg  for about a week. No longer has abdominal pain and nausea or chest pain. Tolerating medication well no new symptoms since starting.  Plan Continue Januvia 25 mg Recheck at next OV in 12/2019

## 2019-11-14 NOTE — Assessment & Plan Note (Signed)
She is due for colonoscopy this year will order after she has barium study

## 2019-11-14 NOTE — Assessment & Plan Note (Addendum)
Patient reports decreased appetite and weight loss for the last month. States she usually has a small breakfast of coffee and cereal or danish and notes early satiety. Usually snacks on fruit, chips, ice cream during the day and does not have lunch because she is not hungry. Has a small dinner of greens or a sandwich. Loss of appetite is associated with weight loss, she is unsure of how much she has lost but states he clothes have become noticeably baggier. She denies fever, chills, night sweats, cough, SOB, chest pain, palpitations,  nausea, vomiting, abdominal pain, diarrhea, hematochezia, melena, syncope, weakness, dysuria, or hematuria. She is concerned about the degree of weight loss associated with her loss of appetite. She does endores increased anxiety due to sever recent family deaths. Follows with [facility] for anxiety and depression. Has been going to bed later and waking up early lately. Encouraged her to go to sleep earlier at night to see if this helps.  She is down 9-10 lbs from last visit 2 months ago. On physical exam her clothes appear baggy and ill fitting she appears otherwise well hydrated and does not appear cachectic. Recent change in diabetic medications. She was unable to tolerate metformin due to abdominal pain and chest pain, which have now resolved after switching to Januvia for about a week ago.   Board differential for her weight lost. Will check CBC, TSH, and CMP today. Weight loss may be partially due to abdominal while on metformin, but doe not explained her continued lack of appetite and early satiety. Will order barium esophagram given her continued dysphagia and now early satiety and loss of appetite. Plan CBC, CMP, TSH Barium esophogram  Addendum: CBC, CMP, TSH unremarkable

## 2019-11-17 NOTE — Progress Notes (Signed)
Internal Medicine Clinic Attending  I saw and evaluated the patient.  I personally confirmed the key portions of the history and exam documented by Dr. Liang and I reviewed pertinent patient test results.  The assessment, diagnosis, and plan were formulated together and I agree with the documentation in the resident's note.  

## 2019-11-18 ENCOUNTER — Telehealth: Payer: Self-pay

## 2019-11-18 NOTE — Telephone Encounter (Signed)
Requesting lab results, please call pt back.  

## 2019-11-19 NOTE — Telephone Encounter (Signed)
I have not been able to reach her. Will continue to try calling her.

## 2019-11-20 NOTE — Telephone Encounter (Signed)
Discussed results with patient. She feels that her appetite has been improving now that more time has been passing since recent deaths in family. Will continue to monitor for further weight loss and have her go to barium esophogram and GI for dysphagia.

## 2019-11-30 ENCOUNTER — Other Ambulatory Visit: Payer: Self-pay | Admitting: Internal Medicine

## 2019-11-30 DIAGNOSIS — I10 Essential (primary) hypertension: Secondary | ICD-10-CM

## 2019-12-26 ENCOUNTER — Encounter: Payer: Self-pay | Admitting: Internal Medicine

## 2019-12-26 ENCOUNTER — Ambulatory Visit (INDEPENDENT_AMBULATORY_CARE_PROVIDER_SITE_OTHER): Payer: Medicare Other | Admitting: Internal Medicine

## 2019-12-26 ENCOUNTER — Other Ambulatory Visit: Payer: Self-pay

## 2019-12-26 VITALS — BP 132/70 | HR 78 | Temp 98.4°F | Ht 60.0 in | Wt 151.3 lb

## 2019-12-26 DIAGNOSIS — R1013 Epigastric pain: Secondary | ICD-10-CM

## 2019-12-26 DIAGNOSIS — I10 Essential (primary) hypertension: Secondary | ICD-10-CM

## 2019-12-26 DIAGNOSIS — R634 Abnormal weight loss: Secondary | ICD-10-CM

## 2019-12-26 DIAGNOSIS — K219 Gastro-esophageal reflux disease without esophagitis: Secondary | ICD-10-CM | POA: Diagnosis not present

## 2019-12-26 DIAGNOSIS — Z23 Encounter for immunization: Secondary | ICD-10-CM | POA: Diagnosis not present

## 2019-12-26 DIAGNOSIS — E1139 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: Secondary | ICD-10-CM | POA: Diagnosis not present

## 2019-12-26 DIAGNOSIS — C259 Malignant neoplasm of pancreas, unspecified: Secondary | ICD-10-CM | POA: Diagnosis not present

## 2019-12-26 LAB — POCT GLYCOSYLATED HEMOGLOBIN (HGB A1C): Hemoglobin A1C: 7.4 % — AB (ref 4.0–5.6)

## 2019-12-26 LAB — GLUCOSE, CAPILLARY: Glucose-Capillary: 122 mg/dL — ABNORMAL HIGH (ref 70–99)

## 2019-12-26 NOTE — Progress Notes (Signed)
Subjective:    [name], female    DOB: 12-Apr-1950, 69 y.o.   MRN: [id number]  CC: Weight loss and abdominal pain  HPI  Ms. Hausner is a 69 year old woman with PMH of HTN, GERd, Dm2 (diet controlled), OA, vertigo, MDD who presents for follow up of weight loss.  She was last seen in October for this issue and had initial blood work done which did not show any concerning findings.  She returns today and has lost an additional 2 pounds.  She notes that she has no appetite.  She has a new finding today which is epigastric pain.  Sharp in nature, present about a week, comes and goes, worse with lying in bed, radiates to the back, 8/10 at its worst.  She notes no fever, chills, no itching or change to skin or eye color.  She denies dysuria, cough blood in the stool.  On review of her cancer screening she had a colonoscopy in 2016 which showed hyperplastic polyps.  She had an MMG this year which was benign.  She is a former smoker, but quit in 1998.  She denies any vaginal discharge or return of bleeding.  She was recently on metformin and felt this might be causing her weight loss, but she changed medications to [place] and the symptoms of nausea and loss of appetite have not changed.  Finally, she did note that the epigastric pain occasionally radiated into the chest and felt like heartburn, but this was mild.  She has had no emesis, no cough, no hematemesis, no diarrhea or constipation.    Review of Systems  Constitutional: Positive for appetite change and unexpected weight change. Negative for activity change, chills, diaphoresis, fatigue and fever.  Respiratory: Positive for shortness of breath (occasional). Negative for cough, choking and chest tightness.   Cardiovascular: Positive for chest pain (radiates from the epigastrium).  Gastrointestinal: Positive for abdominal pain and nausea. Negative for abdominal distention, anal bleeding, blood in stool, constipation, diarrhea, rectal  pain and vomiting.  Genitourinary: Negative for difficulty urinating, dysuria, pelvic pain, vaginal bleeding and vaginal discharge.  Musculoskeletal: Positive for arthralgias (chronic).  Skin: Negative for color change, pallor and wound.  Neurological: Positive for dizziness (chronic). Negative for weakness.  Psychiatric/Behavioral: Negative for decreased concentration and dysphoric mood.       Objective:   Physical Exam Vitals and nursing note reviewed.  Constitutional:      General: She is not in acute distress.    Appearance: Normal appearance. She is not toxic-appearing.  HENT:     Head: Normocephalic and atraumatic.     Mouth/Throat:     Mouth: Mucous membranes are moist.  Eyes:     General: No scleral icterus.       Right eye: No discharge.        Left eye: No discharge.  Cardiovascular:     Rate and Rhythm: Normal rate and regular rhythm.     Heart sounds: No murmur heard. No gallop.   Pulmonary:     Effort: Pulmonary effort is normal. No respiratory distress.     Breath sounds: Normal breath sounds.  Abdominal:     General: Abdomen is flat. Bowel sounds are normal. There is no distension.     Palpations: Abdomen is soft. There is no mass.     Tenderness: There is abdominal tenderness (at the epigastrium and upper periumbilical area.  ). There is no right CVA tenderness, left CVA tenderness, guarding or  rebound.     Hernia: No hernia is present.  Musculoskeletal:        General: No swelling or tenderness.     Right lower leg: No edema.     Left lower leg: No edema.  Skin:    General: Skin is warm and dry.     Coloration: Skin is not jaundiced or pale.     Findings: No rash.  Neurological:     Mental Status: She is alert and oriented to person, place, and time. Mental status is at baseline.  Psychiatric:        Mood and Affect: Mood normal.        Behavior: Behavior normal.     CMET, Lipase and CA 19-9 today  Will plan to get a CT of the abdomen/pelvis as  soon as possible.      Assessment & Plan:  Return to clinic in 2 months, after all work up is complete or earlier if needed.

## 2019-12-26 NOTE — Assessment & Plan Note (Signed)
She has now lost about 14% of her body weight in about 12 months.  She has lost 26 pounds since this time last year and has continued to lose weight since being seen in October.  Initial work up did not show a cause.  She now presents with abdominal pain, epigastric, which would be concerning for pancreatic disease.  She has had no nausea or vomiting.  She notes nothing really makes the pain better.  She is a former smoker.  No history of colon cancer or pancreatic cancer in the family.  Given her symptoms and exam, will proceed with evaluation for pancreatic disease with repeat CMET, Lipase, CA 19 -9 and a CT of the abdomen/pelvis to further evaluate.  Discussed with patient today.

## 2019-12-26 NOTE — Assessment & Plan Note (Signed)
Mariah Henderson has ceased her metformin, however, her nausea, decrease PO intake and weight loss persist.  She is taking Januvia without issue.  Her A1C today is improved to 7.4.  She also takes atorvastatin for cardiac prevention and gabapentin. Will plan to continue Januvia for now.

## 2019-12-26 NOTE — Patient Instructions (Addendum)
Mariah Henderson -   Your A1C is improved.  Keep taking your Januvia as prescribed.   For your abdominal pain and weight loss, I would like you to get a CT of your abdomen and pelvis.  Please schedule that as soon as you can.   I will get some blood work today as well.   Thank you!

## 2019-12-26 NOTE — Assessment & Plan Note (Signed)
BP today is well controlled at 132/70.  She notes that she is taking her medications without issue, beyond having to urinate a lot given her diuretic.  She wears adult pads.   Plan Check CMET today and continue hctz, benazapril and lasix

## 2019-12-26 NOTE — Assessment & Plan Note (Signed)
She notes some intermittent gerd, this seems related to her recent abdominal pain.  She is taking her PPI without issue.  Further work up noted for abdominal pain and weight loss.

## 2019-12-27 ENCOUNTER — Other Ambulatory Visit: Payer: Self-pay | Admitting: Internal Medicine

## 2019-12-27 DIAGNOSIS — J302 Other seasonal allergic rhinitis: Secondary | ICD-10-CM

## 2019-12-27 LAB — CMP14 + ANION GAP
ALT: 86 IU/L — ABNORMAL HIGH (ref 0–32)
AST: 94 IU/L — ABNORMAL HIGH (ref 0–40)
Albumin/Globulin Ratio: 1.5 (ref 1.2–2.2)
Albumin: 4 g/dL (ref 3.7–4.7)
Alkaline Phosphatase: 210 IU/L — ABNORMAL HIGH (ref 44–121)
Anion Gap: 14 mmol/L (ref 10.0–18.0)
BUN/Creatinine Ratio: 17 (ref 12–28)
BUN: 13 mg/dL (ref 8–27)
Bilirubin Total: 0.3 mg/dL (ref 0.0–1.2)
CO2: 29 mmol/L (ref 20–29)
Calcium: 9.8 mg/dL (ref 8.7–10.3)
Chloride: 96 mmol/L (ref 96–106)
Creatinine, Ser: 0.78 mg/dL (ref 0.57–1.00)
GFR calc Af Amer: 88 mL/min/{1.73_m2} (ref >59)
GFR calc non Af Amer: 77 mL/min/{1.73_m2} (ref >59)
Globulin, Total: 2.7 g/dL (ref 1.5–4.5)
Glucose: 138 mg/dL — ABNORMAL HIGH (ref 65–99)
Potassium: 4.4 mmol/L (ref 3.5–5.2)
Sodium: 139 mmol/L (ref 134–144)
Total Protein: 6.7 g/dL (ref 6.0–8.5)

## 2019-12-27 LAB — CANCER ANTIGEN 19-9: CA 19-9: 5 U/mL (ref 0–35)

## 2019-12-27 LAB — LIPASE: Lipase: 32 U/L (ref 14–85)

## 2019-12-30 ENCOUNTER — Encounter: Payer: Self-pay | Admitting: *Deleted

## 2019-12-30 NOTE — Progress Notes (Signed)

## 2020-01-01 NOTE — Addendum Note (Signed)
Addended by: Gilles Chiquito B on: 01/01/2020 01:37 PM   Modules accepted: Orders

## 2020-01-02 ENCOUNTER — Telehealth: Payer: Self-pay | Admitting: *Deleted

## 2020-01-02 NOTE — Telephone Encounter (Signed)
Returned call to patient. No answer. Left message on VM requesting return call to discuss pain. Hubbard Hartshorn, RN, BSN

## 2020-01-02 NOTE — Telephone Encounter (Signed)
Patient called requesting Dr. Daryll Drown call her in something for pain.

## 2020-01-06 NOTE — Telephone Encounter (Signed)
Attempted to call patient.  No response.  She noted going out of town for the holidays.  If she should call back, please have a resident do a telehealth to assess whether she needs to present to the hospital.

## 2020-01-06 NOTE — Progress Notes (Signed)
Things That May Be Affecting Your Health:  Alcohol  Hearing loss X Pain    Depression  Home Safety  Sexual Health   Diabetes X Lack of physical activity  Stress   Difficulty with daily activities  Loneliness  Tiredness   Drug use X Medicines  Tobacco use   Falls  Motor Vehicle Safety  Weight   Food choices  Oral Health  Other    YOUR PERSONALIZED HEALTH PLAN : 1. Schedule your next subsequent Medicare Wellness visit in one year 2. Attend all of your regular appointments to address your medical issues 3. Complete the preventative screenings and services   Annual Wellness Visit   Medicare Covered Preventative Screenings and [place] Men and Women Who How Often Need? Date of Last Service Action  Abdominal Aortic Aneurysm Adults with AAA risk factors Once     Alcohol Misuse and Counseling All Adults Screening once a year if no alcohol misuse. Counseling up to 4 face to face sessions.     Bone Density Measurement  Adults at risk for osteoporosis Once every 2 yrs No 2020 Osteoporosis on therapy  Lipid Panel Z13.6 All adults without CV disease Once every 5 yrs No    Colorectal Cancer   Stool sample or  Colonoscopy All adults 66 and older   Once every year  Every 10 years No 2016 10 year  Depression All Adults Once a year  Today   Diabetes Screening Blood glucose, post glucose load, or GTT Z13.1  All adults at risk  Pre-diabetics  Once per year  Twice per year     Diabetes  Self-Management Training All adults Diabetics 10 hrs first year; 2 hours subsequent years. Requires Copay     Glaucoma  Diabetics  Family history of glaucoma  African Americans 38 yrs +  Hispanic Americans 75 yrs + Annually - requires coppay Unknown, assess with patient    Hepatitis C Z72.89 or F19.20  High Risk for HCV  Born between 1945 and 1965  Annually  Once No    HIV Z11.4 All adults based on risk  Annually btw ages 35 & 71 regardless of risk  Annually > 65 yrs  if at increased risk No    Lung Cancer Screening Asymptomatic adults aged 62-77 with 58 pack yr history and current smoker OR quit within the last 15 yrs Annually Must have counseling and shared decision making documentation before first screen No  Quit in [place] with   Diabetes  Renal disease  Kidney transplant within past 3 yrs 3 hours first year; 2 hours subsequent years     Obesity and Counseling All adults Screening once a year Counseling if BMI 30 or higher  Today   Tobacco Use Counseling Adults who use tobacco  Up to 8 visits in one year     Vaccines Z23  Hepatitis B  Influenza   Pneumonia  Adults   Once  Once every flu season  Two different vaccines separated by one year No    Next Annual Wellness Visit People with Medicare Every year  Today     Services & Screenings Women Who How Often Need  Date of Last Service Action  Mammogram  Z12.31 Women over 36 One baseline ages 35-39. Annually ager 14 yrs+ No    Pap tests All women Annually if high risk. Every 2 yrs for normal risk women No    Screening for cervical cancer with  Pap (Z01.419 nl or Z01.411abnl) &  HPV Z11.51 Women aged 5 to 59 Once every 5 yrs     Screening pelvic and breast exams All women Annually if high risk. Every 2 yrs for normal risk women     Sexually Transmitted Diseases  Chlamydia  Gonorrhea  Syphilis All at risk adults Annually for non pregnant females at increased risk         [place] Men Who How Ofter Need  Date of Last Service Action  Prostate Cancer - DRE & PSA Men over 50 Annually.  DRE might require a copay.     Sexually Transmitted Diseases  Syphilis All at risk adults Annually for men at increased risk

## 2020-01-10 ENCOUNTER — Other Ambulatory Visit: Payer: Self-pay | Admitting: Internal Medicine

## 2020-01-10 DIAGNOSIS — E1139 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: Secondary | ICD-10-CM

## 2020-01-12 ENCOUNTER — Telehealth: Payer: Self-pay

## 2020-01-12 ENCOUNTER — Telehealth: Payer: Self-pay | Admitting: *Deleted

## 2020-01-12 MED ORDER — ONETOUCH ULTRA MINI W/DEVICE KIT: 1.0000 | PACK | Freq: Once | 0 refills | Status: AC

## 2020-01-12 NOTE — Telephone Encounter (Signed)
Called pt who stated H[facility] had called her about abd u/s appt scheduled tomorrow. Pt aware NPO after 10 AM ( npo x 6 - 8  Hrs).  Reminded pt to arrive @ 1615 PM for 1630 PM appt in radiology. Stated understanding.

## 2020-01-12 NOTE — Telephone Encounter (Signed)
Pt states she lost her meter, requesting a new meter sent to  [facility] [phone] [place], [place] - [address] Phone:  [phone]  Fax:  [phone]

## 2020-01-12 NOTE — Telephone Encounter (Signed)
She is no longer on this medication so will refuse.  Thanks!

## 2020-01-13 ENCOUNTER — Telehealth: Payer: Self-pay | Admitting: *Deleted

## 2020-01-13 ENCOUNTER — Other Ambulatory Visit: Payer: Self-pay

## 2020-01-13 ENCOUNTER — Ambulatory Visit (HOSPITAL_COMMUNITY)
Admission: RE | Admit: 2020-01-13 | Discharge: 2020-01-13 | Disposition: A | Discharge status: Home / Self Care | Payer: Medicare Other | Source: Ambulatory Visit | Attending: Internal Medicine | Admitting: Internal Medicine

## 2020-01-13 DIAGNOSIS — R1013 Epigastric pain: Secondary | ICD-10-CM | POA: Diagnosis not present

## 2020-01-13 DIAGNOSIS — R634 Abnormal weight loss: Secondary | ICD-10-CM | POA: Diagnosis not present

## 2020-01-13 DIAGNOSIS — R945 Abnormal results of liver function studies: Secondary | ICD-10-CM | POA: Diagnosis not present

## 2020-01-13 DIAGNOSIS — D7389 Other diseases of spleen: Secondary | ICD-10-CM | POA: Diagnosis not present

## 2020-01-13 DIAGNOSIS — R109 Unspecified abdominal pain: Secondary | ICD-10-CM | POA: Diagnosis not present

## 2020-01-13 MED ORDER — IOHEXOL 300 MG/ML  SOLN
100.0000 mL | Freq: Once | INTRAMUSCULAR | Status: AC | PRN
  Administered 2020-01-13: 100 mL via INTRAVENOUS

## 2020-01-13 NOTE — Telephone Encounter (Signed)
Please let Mariah Henderson know to use her flonase daily to help with this.  If she develops fever, chills, decreased ability to take PO or any other concerning symptoms to call back to get an appointment.  Thanks!

## 2020-01-13 NOTE — Telephone Encounter (Signed)
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 States she has been having nasal drainage, back of her throat into her chest. States this happens every year. She wants her doctor to send rx to her pharmacy. Thanks

## 2020-01-13 NOTE — Telephone Encounter (Signed)
Attempted to call pt, no answer, no vmail 

## 2020-01-14 ENCOUNTER — Ambulatory Visit (INDEPENDENT_AMBULATORY_CARE_PROVIDER_SITE_OTHER): Payer: Medicare Other | Admitting: Internal Medicine

## 2020-01-14 ENCOUNTER — Ambulatory Visit (HOSPITAL_COMMUNITY): Payer: Medicare Other

## 2020-01-14 ENCOUNTER — Telehealth: Payer: Self-pay | Admitting: *Deleted

## 2020-01-14 ENCOUNTER — Other Ambulatory Visit: Payer: Self-pay

## 2020-01-14 ENCOUNTER — Encounter: Payer: Self-pay | Admitting: Internal Medicine

## 2020-01-14 VITALS — BP 149/78 | HR 78 | Temp 99.1°F | Wt 148.7 lb

## 2020-01-14 DIAGNOSIS — R509 Fever, unspecified: Secondary | ICD-10-CM | POA: Diagnosis not present

## 2020-01-14 DIAGNOSIS — J018 Other acute sinusitis: Secondary | ICD-10-CM

## 2020-01-14 DIAGNOSIS — C259 Malignant neoplasm of pancreas, unspecified: Secondary | ICD-10-CM

## 2020-01-14 DIAGNOSIS — J4540 Moderate persistent asthma, uncomplicated: Secondary | ICD-10-CM

## 2020-01-14 DIAGNOSIS — G4452 New daily persistent headache (NDPH): Secondary | ICD-10-CM

## 2020-01-14 DIAGNOSIS — R634 Abnormal weight loss: Secondary | ICD-10-CM

## 2020-01-14 DIAGNOSIS — J019 Acute sinusitis, unspecified: Secondary | ICD-10-CM | POA: Insufficient documentation

## 2020-01-14 DIAGNOSIS — F325 Major depressive disorder, single episode, in full remission: Secondary | ICD-10-CM

## 2020-01-14 DIAGNOSIS — J302 Other seasonal allergic rhinitis: Secondary | ICD-10-CM

## 2020-01-14 DIAGNOSIS — I10 Essential (primary) hypertension: Secondary | ICD-10-CM

## 2020-01-14 DIAGNOSIS — G8929 Other chronic pain: Secondary | ICD-10-CM

## 2020-01-14 DIAGNOSIS — M159 Polyosteoarthritis, unspecified: Secondary | ICD-10-CM

## 2020-01-14 DIAGNOSIS — M25551 Pain in right hip: Secondary | ICD-10-CM

## 2020-01-14 DIAGNOSIS — M8949 Other hypertrophic osteoarthropathy, multiple sites: Secondary | ICD-10-CM

## 2020-01-14 MED ORDER — SITAGLIPTIN PHOSPHATE 25 MG PO TABS: 25.0000 mg | ORAL_TABLET | Freq: Every day | ORAL | 1 refills | Status: DC

## 2020-01-14 MED ORDER — HYDROCHLOROTHIAZIDE 12.5 MG PO CAPS: ORAL_CAPSULE | ORAL | 1 refills | Status: DC

## 2020-01-14 MED ORDER — BENAZEPRIL HCL 20 MG PO TABS: ORAL_TABLET | ORAL | 3 refills | Status: DC

## 2020-01-14 MED ORDER — FAMOTIDINE 40 MG PO TABS: ORAL_TABLET | ORAL | 3 refills | Status: DC

## 2020-01-14 MED ORDER — ATORVASTATIN CALCIUM 40 MG PO TABS: 40.0000 mg | ORAL_TABLET | Freq: Every day | ORAL | 3 refills | Status: DC

## 2020-01-14 MED ORDER — FLUTICASONE PROPIONATE 50 MCG/ACT NA SUSP: 1.0000 | Freq: Every day | NASAL | 3 refills | Status: DC

## 2020-01-14 MED ORDER — CELECOXIB 100 MG PO CAPS: ORAL_CAPSULE | ORAL | 1 refills | Status: DC

## 2020-01-14 MED ORDER — HYDROCODONE-ACETAMINOPHEN 5-325 MG PO TABS: 1.0000 | ORAL_TABLET | Freq: Two times a day (BID) | ORAL | 0 refills | Status: DC | PRN

## 2020-01-14 MED ORDER — ALENDRONATE SODIUM 70 MG PO TABS: ORAL_TABLET | ORAL | 3 refills | Status: DC

## 2020-01-14 MED ORDER — AMOXICILLIN-POT CLAVULANATE 875-125 MG PO TABS: 1.0000 | ORAL_TABLET | Freq: Two times a day (BID) | ORAL | 0 refills | Status: AC

## 2020-01-14 MED ORDER — LORATADINE 10 MG PO TABS: 10.0000 mg | ORAL_TABLET | Freq: Every day | ORAL | 11 refills | Status: DC

## 2020-01-14 MED ORDER — FLUTICASONE-SALMETEROL 100-50 MCG/DOSE IN AEPB: 1.0000 | INHALATION_SPRAY | Freq: Two times a day (BID) | RESPIRATORY_TRACT | 11 refills | Status: DC

## 2020-01-14 NOTE — Progress Notes (Signed)
   Subjective:    [name], female    DOB: 15-Jul-1950, 69 y.o.   MRN: [id number]  CC: Sinus pressure and pain  HPI  Mariah Henderson is a 69 year old woman who presented with sinus pressure, pain, post nasal drip and chest congestion.  She notes further symptoms of fever and cough.  She has not been around anyone ill, but she did visit family for the holiday.    Mariah Henderson has also been having decreased weight, loss of appetite and pain in the abdomen.  We did some imaging which is consistent with pancreatic adenocarcinoma and concern for metastatic liver disease.  We discussed these findings and I advised she see an oncologist, which she is willing to do.  I also advised her that her sister (twin) and son can call the clinic and I will discuss the diagnosis with them.  For her pain, we will prescribe hydrocodone.   Review of Systems  Constitutional: Positive for appetite change, fatigue, fever and unexpected weight change. Negative for activity change.  HENT: Positive for congestion, postnasal drip, rhinorrhea, sinus pressure and sinus pain.   Respiratory: Positive for cough and chest tightness. Negative for shortness of breath, wheezing and stridor.   Cardiovascular: Negative for chest pain and leg swelling.  Gastrointestinal: Positive for abdominal pain.  Psychiatric/Behavioral: Negative for decreased concentration and dysphoric mood.       Objective:   Physical Exam Constitutional:      General: She is not in acute distress.    Appearance: Normal appearance. She is not toxic-appearing.  HENT:     Head: Normocephalic and atraumatic.     Nose: Congestion and rhinorrhea present.     Mouth/Throat:     Pharynx: No oropharyngeal exudate or posterior oropharyngeal erythema.  Eyes:     General: No scleral icterus.       Right eye: No discharge.        Left eye: No discharge.  Pulmonary:     Effort: Pulmonary effort is normal. No respiratory distress.  Skin:    General:  Skin is warm and dry.  Neurological:     Mental Status: She is alert and oriented to person, place, and time. Mental status is at baseline.           Assessment & Plan:  Return as planned in 1-2 months, after seeing oncology.  Advised she can call for any questions.

## 2020-01-14 NOTE — Patient Instructions (Signed)
Mariah Henderson - -  For your sinuses, please take Augmentin twice a day for 10 days.   For your pain, please start taking hydrocodone-acetaminophen up to twice per day.  Please be aware this medication may make you sleepy so do not drive until you know how it will affect you.  Also, this medication can cause constipation.  Please take senna, colace or miralax for constipation.    Your CT scan of the abdomen was concerning for pancreatic adenocarcinoma (cancer of the pancreas).  More information is below.  I have placed a referral to Oncology for you.   Thank you!!  Please call me if you have ANY questions.    Pancreatic Cancer  The pancreas is a gland in the abdomen between the stomach and the spine. The pancreas makes hormones that control blood sugar and helps the body use and store energy that comes from food. The pancreas also makes enzymes that help digest food. Pancreatic cancer is when there is a tumor in the pancreas that is cancerous (malignant). There are two types of pancreatic cancer:  Exocrine. This is the most common type.  Endocrine. This is also called islet cell cancer. Pancreatic cancer can spread (metastasize) to other parts of the body. What are the causes? The exact cause of this condition is not known. What increases the risk? The following factors may make you more likely to develop this condition:  Being over 50 years old.  Smoking cigarettes.  Having a family history of cancer of the pancreas, colon, or ovaries.  Having diabetes.  Having long-term inflammation of the pancreas (chronic pancreatitis).  Being exposed to certain chemicals.  Being obese and having a decreased level of physical activity.  Eating a diet that is high in fat and red meat.  Having certain hereditary conditions. What are the signs or symptoms? In the early stages, there are often no symptoms of this condition. As the cancer gets worse, symptoms may vary depending on the type of  pancreatic cancer you have. Common symptoms include:  Nausea and vomiting.  Loss of appetite and unintended weight loss.  Pain in the upper abdomen or upper back.  Skin or the white parts of the eyes turning yellow (jaundice).  Fatigue. Other symptoms include:  Itchy skin.  Dark urine.  Stools that are light-colored and greasy-looking, or stools that are black and tarry-looking.  A lump under the rib cage on the right side.  High blood sugar (hyperglycemia). This may cause increased thirst and frequent urination.  Low blood sugar (hypoglycemia). This may cause confusion, sweating, and a fast heartbeat.  Depression. How is this diagnosed? This condition may be diagnosed based on your medical history and a physical exam. Your health care provider may check your:  Skin and eyes for signs of jaundice.  Abdomen for any changes in the areas near the pancreas. Your health care provider may also check for excess fluid in the abdomen. You may also have other tests, including:  Blood and urine tests.  Genetic testing.  Ultrasound.  CT scan.  MRI.  A biopsy. A sample of pancreatic tissue would be removed and looked at under a microscope. If pancreatic cancer is diagnosed, it will be staged to determine its severity and extent. Staging is an assessment of:  The size of the tumor.  If the cancer has spread.  Where the cancer has spread. How is this treated? Depending on the type and stage of your pancreatic cancer, treatment may include:  Surgery to remove all or part of the pancreas, or to remove the tumor.  Chemotherapy. This uses medicine to destroy the cancer cells.  Radiation therapy. This uses high-energy beams to kill cancer cells.  Medicine to attack a tumor's genes and proteins (targeted therapy). These medicines attack the genes and proteins that allow a tumor to grow while limiting damage to healthy cells.  Participating in clinical trials to see if new  (experimental) treatments are effective.  Medicines to help manage pain and other symptoms. Your health care provider may recommend a combination of surgery, radiation therapy, and chemotherapy. You may be referred to a health care provider who specializes in cancer (oncologist). Follow these instructions at home: Medicines  Take over-the-counter and prescription medicines only as told by your health care provider.  Ask your health care provider about changing or stopping your regular medicines. This is especially important if you are taking diabetes medicines or blood thinners.  Do not take dietary supplements or herbal medicines unless your health care provider tells you to take them. Some supplements can interfere with how well the treatment works.  Ask your health care provider if the medicine prescribed to you: ? Requires you to avoid driving or using heavy machinery. ? Can cause constipation. You may need to take these actions to prevent or treat constipation:  Drink enough fluid to keep your urine pale yellow.  Take over-the-counter or prescription medicines.  Eat foods that are high in fiber, such as beans, whole grains, and fresh fruits and vegetables.  Limit foods that are high in fat and processed sugars, such as fried or sweet foods. Lifestyle  Get enough sleep on a regular basis. Most adults need 6-8 hours of sleep each night. During treatment, you may need more sleep.  Rest as told by your health care provider.  Consider joining a cancer support group. Ask your health care provider for more information about local and online support groups. This may help you learn to cope with the stress of having pancreatic cancer.  Do not use any products that contain nicotine or tobacco, such as cigarettes, e-cigarettes, and chewing tobacco. If you need help quitting, ask your health care provider. Eating and drinking  Try to eat regular, healthy meals. Some of your treatments  might affect your appetite. If you are having problems eating or with your appetite, ask to meet with a food and nutrition specialist (dietitian).  Do not drink alcohol. General instructions  Return to your normal activities as told by your health care provider. Ask your health care provider what activities are safe for you.  Work with your health care provider to manage any side effects of your treatment.  Keep all follow-up visits as told by your health care provider. This is important. Where to find more information  American Cancer Society: https://www.cancer.[place] https://www.cancer.gov Contact a health care provider if:  You have unexplained weight loss. Get help right away if:  Your pain suddenly gets worse.  Your skin or eyes turn more yellow.  You cannot eat or drink without vomiting.  You have: ? Trouble breathing. ? Chest pain or an irregular heartbeat. ? Blood in your vomit or dark, tarry stools. ? New fatigue or weakness. ? Abdominal bloating or pain. These symptoms may represent a serious problem that is an emergency. Do not wait to see if the symptoms will go away. Get medical help right away. Call your local emergency services (911 in the [place]  Do not drive yourself to the hospital. Summary  Pancreatic cancer is a tumor in the pancreas that is cancerous (malignant).  Risk factors include having a family history of cancer of the pancreas, colon, or ovaries. Risk factors also include having long-term inflammation of the pancreas (chronic pancreatitis) and diabetes.  Treatment may include a combination of surgery, medicine to destroy cancer cells (chemotherapy), and high-energy beams to kill cancer cells (radiation therapy).  Consider joining a cancer support group. This may help you learn to cope with the stress of having pancreatic cancer.  Keep all follow-up visits as told by your health care provider. This is  important. This information is not intended to replace advice given to you by your health care provider. Make sure you discuss any questions you have with your health care provider. Document Revised: 04/04/2018 Document Reviewed: 04/11/2018 Elsevier Patient Education  [place].  Amoxicillin; Clavulanic Acid Tablets What is this medicine? AMOXICILLIN; CLAVULANIC ACID (a mox i SIL in; KLAV yoo lan ic AS id) is a penicillin antibiotic. It treats some infections caused by bacteria. It will not work for colds, the flu, or other viruses. This medicine may be used for other purposes; ask your health care provider or pharmacist if you have questions. COMMON BRAND NAME(S): Augmentin What should I tell my health care provider before I take this medicine? They need to know if you have any of these conditions:  bowel disease, like colitis  kidney disease  liver disease  mononucleosis  an unusual or allergic reaction to amoxicillin, penicillin, cephalosporin, other antibiotics, clavulanic acid, other medicines, foods, dyes, or preservatives  pregnant or trying to get pregnant  breast-feeding How should I use this medicine? Take this drug by mouth. Take it as directed on the prescription label at the same time every day. Take it with food at the start of a meal or snack. Take all of this drug unless your health care provider tells you to stop it early. Keep taking it even if you think you are better. Talk to your health care provider about the use of this drug in children. While it may be prescribed for selected conditions, precautions do apply. Overdosage: If you think you have taken too much of this medicine contact a poison control center or emergency room at once. NOTE: This medicine is only for you. Do not share this medicine with others. What if I miss a dose? If you miss a dose, take it as soon as you can. If it is almost time for your next dose, take only that dose. Do not take  double or extra doses. What may interact with this medicine?  allopurinol  anticoagulants  birth control pills  methotrexate  probenecid This list may not describe all possible interactions. Give your health care provider a list of all the medicines, herbs, non-prescription drugs, or dietary supplements you use. Also tell them if you smoke, drink alcohol, or use illegal drugs. Some items may interact with your medicine. What should I watch for while using this medicine? Tell your doctor or healthcare provider if your symptoms do not improve. This medicine may cause serious skin reactions. They can happen weeks to months after starting the medicine. Contact your healthcare provider right away if you notice fevers or flu-like symptoms with a rash. The rash may be red or purple and then turn into blisters or peeling of the skin. Or, you might notice a red rash with swelling of the face, lips or lymph  nodes in your neck or under your arms. Do not treat diarrhea with over the counter products. Contact your doctor if you have diarrhea that lasts more than 2 days or if it is severe and watery. If you have diabetes, you may get a false-positive result for sugar in your urine. Check with your doctor or healthcare provider. Birth control pills may not work properly while you are taking this medicine. Talk to your doctor about using an extra method of birth control. What side effects may I notice from receiving this medicine? Side effects that you should report to your doctor or health care professional as soon as possible:  allergic reactions like skin rash, itching or hives, swelling of the face, lips, or tongue  breathing problems  dark urine  fever or chills, sore throat  redness, blistering, peeling, or loosening of the skin, including inside the mouth  seizures  trouble passing urine or change in the amount of urine  unusual bleeding, bruising  unusually weak or tired  white  patches or sores in the mouth or throat Side effects that usually do not require medical attention (report to your doctor or health care professional if they continue or are bothersome):  diarrhea  dizziness  headache  nausea, vomiting  stomach upset  vaginal or anal irritation This list may not describe all possible side effects. Call your doctor for medical advice about side effects. You may report side effects to FDA at 1-800-FDA-1088. Where should I keep my medicine? Keep out of the reach of children and pets. Store at room temperature between 20 and 25 degrees C (68 and 77 degrees F). Throw away any unused drug after the expiration date. NOTE: This sheet is a summary. It may not cover all possible information. If you have questions about this medicine, talk to your doctor, pharmacist, or health care provider.  2020 Elsevier/Gold Standard (2018-08-05 11:55:53)  Acetaminophen; Hydrocodone tablets or capsules What is this medicine? ACETAMINOPHEN; HYDROCODONE (a set a MEE noe fen; hye droe KOE done) is a pain reliever. It is used to treat moderate to severe pain. This medicine may be used for other purposes; ask your health care provider or pharmacist if you have questions. COMMON BRAND NAME(S): Anexsia, Bancap HC, Ceta-Plus, Co-Gesic, Comfortpak, Dolagesic, [employer], DuoCet, Hydrocet, Hydrogesic, [place], Lorcet HD, Lorcet Plus, Lortab, Margesic H, Maxidone, [place], Polygesic, [place], [place], [place], Vicodin, Vicodin ES, Vicodin HP, Charlane Ferretti What should I tell my health care provider before I take this medicine? They need to know if you have any of these conditions:  brain tumor  Crohn's disease, inflammatory bowel disease, or ulcerative colitis  drug abuse or addiction  head injury  heart or circulation problems  if you often drink alcohol  kidney disease or problems going to the bathroom  liver disease  lung disease, asthma, or breathing problems  an  unusual or allergic reaction to acetaminophen, hydrocodone, other opioid analgesics, other medicines, foods, dyes, or preservatives  pregnant or trying to get pregnant  breast-feeding How should I use this medicine? Take this medicine by mouth with a glass of water. Follow the directions on the prescription label. You can take it with or without food. If it upsets your stomach, take it with food. Do not take your medicine more often than directed. A special MedGuide will be given to you by the pharmacist with each prescription and refill. Be sure to read this information carefully each time. Talk to your pediatrician regarding the use of  this medicine in children. Special care may be needed. Overdosage: If you think you have taken too much of this medicine contact a poison control center or emergency room at once. NOTE: This medicine is only for you. Do not share this medicine with others. What if I miss a dose? If you miss a dose, take it as soon as you can. If it is almost time for your next dose, take only that dose. Do not take double or extra doses. What may interact with this medicine? This medicine may interact with the following medications:  alcohol  antiviral medicines for HIV or AIDS  atropine  antihistamines for allergy, cough and cold  certain antibiotics like erythromycin, clarithromycin  certain medicines for anxiety or sleep  certain medicines for bladder problems like oxybutynin, tolterodine  certain medicines for depression like amitriptyline, fluoxetine, sertraline  certain medicines for fungal infections like ketoconazole and itraconazole  certain medicines for Parkinson's disease like benztropine, trihexyphenidyl  certain medicines for seizures like carbamazepine, phenobarbital, phenytoin, primidone  certain medicines for stomach problems like dicyclomine, hyoscyamine  certain medicines for travel sickness like scopolamine  general anesthetics like  halothane, isoflurane, methoxyflurane, propofol  ipratropium  local anesthetics like lidocaine, pramoxine, tetracaine  MAOIs like Carbex, Eldepryl, Marplan, Nardil, and Parnate  medicines that relax muscles for surgery  other medicines with acetaminophen  other narcotic medicines for pain or cough  phenothiazines like chlorpromazine, mesoridazine, prochlorperazine, thioridazine  rifampin This list may not describe all possible interactions. Give your health care provider a list of all the medicines, herbs, non-prescription drugs, or dietary supplements you use. Also tell them if you smoke, drink alcohol, or use illegal drugs. Some items may interact with your medicine. What should I watch for while using this medicine? Tell your health care provider if your pain does not go away, if it gets worse, or if you have new or a different type of pain. You may develop tolerance to this drug. Tolerance means that you will need a higher dose of the drug for pain relief. Tolerance is normal and is expected if you take this drug for a long time. There are different types of narcotic drugs (opioids) for pain. If you take more than one type at the same time, you may have more side effects. Give your health care provider a list of all drugs you use. He or she will tell you how much drug to take. Do not take more drug than directed. Get emergency help right away if you have problems breathing. Do not suddenly stop taking your drug because you may develop a severe reaction. Your body becomes used to the drug. This does NOT mean you are addicted. Addiction is a behavior related to getting and using a drug for a nonmedical reason. If you have pain, you have a medical reason to take pain drug. Your health care provider will tell you how much drug to take. If your health care provider wants you to stop the drug, the dose will be slowly lowered over time to avoid any side effects. Talk to your health care provider  about naloxone and how to get it. Naloxone is an emergency drug used for an opioid overdose. An overdose can happen if you take too much opioid. It can also happen if an opioid is taken with some other drugs or substances, like alcohol. Know the symptoms of an overdose, like trouble breathing, unusually tired or sleepy, or not being able to respond or wake up. Make  sure to tell caregivers and close contacts where it is stored. Make sure they know how to use it. After naloxone is given, you must get emergency help right away. Naloxone is a temporary treatment. Repeat doses may be needed. Do not take other drugs that contain acetaminophen with this drug. Many non-prescription drugs contain acetaminophen. Always read labels carefully. If you have questions, ask your health care provider. If you take too much acetaminophen, get medical help right away. Too much acetaminophen can be very dangerous and cause liver damage. Even if you do not have symptoms, it is important to get help right away. You may get drowsy or dizzy. Do not drive, use machinery, or do anything that needs mental alertness until you know how this drug affects you. Do not stand up or sit up quickly, especially if you are an older patient. This reduces the risk of dizzy or fainting spells. Alcohol may interfere with the effect of this drug. Avoid alcoholic drinks. This drug will cause constipation. If you do not have a bowel movement for 3 days, call your health care provider. Your mouth may get dry. Chewing sugarless gum or sucking hard candy and drinking plenty of water may help. Contact your health care provider if the problem does not go away or is severe. What side effects may I notice from receiving this medicine? Side effects that you should report to your doctor or health care professional as soon as possible:  allergic reactions like skin rash, itching or hives, swelling of the face, lips, or tongue  breathing  problems  confusion  redness, blistering, peeling or loosening of the skin, including inside the mouth  signs and symptoms of low blood pressure like dizziness; feeling faint or lightheaded, falls; unusually weak or tired  trouble passing urine or change in the amount of urine  yellowing of the eyes or skin Side effects that usually do not require medical attention (report to your doctor or health care professional if they continue or are bothersome):  constipation  dry mouth  nausea, vomiting  tiredness This list may not describe all possible side effects. Call your doctor for medical advice about side effects. You may report side effects to FDA at 1-800-FDA-1088. Where should I keep my medicine? Keep out of the reach of children. This medicine can be abused. Keep your medicine in a safe place to protect it from theft. Do not share this medicine with anyone. Selling or giving away this medicine is dangerous and against the law. Store at room temperature between 15 and 30 degrees C (59 and 86 degrees F). This medicine may cause harm and death if it is taken by other adults, children, or pets. Return medicine that has not been used to an official disposal site. Contact the DEA at 540-868-7751 or your city/county government to find a site. If you cannot return the medicine, flush it down the toilet. Do not use the medicine after the expiration date. NOTE: This sheet is a summary. It may not cover all possible information. If you have questions about this medicine, talk to your doctor, pharmacist, or health care provider.  2020 Elsevier/Gold Standard (2018-08-12 12:05:41)

## 2020-01-14 NOTE — Assessment & Plan Note (Signed)
CT scan and ultrasound consistent with pancreatic adenocarcinoma with possible metastasis to the liver.   Plan Consult to oncology Hydrocodone-apap 5/325 BID PRN for pain Return precautions given Advised to take a stool softener.

## 2020-01-14 NOTE — Telephone Encounter (Signed)
Call from [facility], about CT report. Dr Criselda Peaches already aware of report, result available in Epic.

## 2020-01-14 NOTE — Assessment & Plan Note (Signed)
Will plan for a 10 day course of Augmentin given fever and findings on exam.   Plan Augmentin 875-125 BID for 10 days.   Return precautions advised.

## 2020-01-14 NOTE — Telephone Encounter (Signed)
Call from pt - informed to use Flonase per Dr Criselda Peaches; stated she has been using it.Dr Criselda Peaches stated she will see pt today; pt stated she will come in after getting dressed.

## 2020-01-15 NOTE — Progress Notes (Signed)
Spoke with patient regarding referral we received from Dr. Criselda Peaches her PCP.  I have scheduled her with Dr. Mosetta Putt on Wednesday 01/21/2020 at 3 pm to arrive no later than 2:45 for registration purposes.  I have also explained that she can bring one person with her to the consult.  She is aware of our actual location. The patient verbalized an understanding and appreciated the call.

## 2020-01-19 ENCOUNTER — Other Ambulatory Visit: Payer: Self-pay | Admitting: Internal Medicine

## 2020-01-19 DIAGNOSIS — E119 Type 2 diabetes mellitus without complications: Secondary | ICD-10-CM

## 2020-01-19 NOTE — Progress Notes (Signed)
[place]   [phone] Fax:([phone] [place] Note   Patient Care Team: Sid Falcon, MD as PCP - General (Internal Medicine) Forrest Moron, OD (Optometry) Jonnie Finner, RN as Oncology Nurse Navigator Truitt Merle, MD as Consulting Physician (Oncology)  Date of Service:  01/21/2020   CHIEF COMPLAINTS/PURPOSE OF CONSULTATION:  Probable pancreatic cancer with liver metastasis   REFERRING PHYSICIAN:  PCP, Dr Daryll Drown   Oncology History Overview Note  Cancer Staging No matching staging information was found for the patient.    Pancreatic cancer ([place])  01/13/2020 Imaging   US Abdomen 01/13/20  IMPRESSION: 1. Suggestion of 3 vascular hypoechoic mass within the liver measuring up to 2.5 cm. 2. Suggestion of a hypoechoic mass within the tail of the pancreas that is not well visualized. 3. Findings concerning for malignancy, please see separately dictated CT abdomen pelvis 01/13/2020.   01/13/2020 Imaging   CT AP 01/13/20  IMPRESSION: 1. Hypoenhancing 2.0 cm in long axis mass in the pancreatic tail compatible with pancreatic adenocarcinoma. The mass abuts the posterior gastric wall but without definite gastric invasion. The splenic vein appears narrowed/attenuated compared to prior exams and the lesion abuts the margin of the splenic artery. 2. Hypoenhancing hepatic masses are new compared to 11/22/2017 and are highly suspicious for metastatic lesions. 3. Questionable 0.8 by 0.8 cm omental tumor nodule versus dense diverticulum above the transverse colon. 4. Other imaging findings of potential clinical significance: Mild distal esophageal wall thickening, query esophagitis. Stable left adrenal adenoma. Sigmoid colon diverticulosis. Lumbar and thoracic spondylosis and degenerative disc disease contributing to multilevel impingement. 5. Aortic atherosclerosis. Aortic Atherosclerosis (ICD10-I70.0).   01/21/2020 Initial  Diagnosis   Pancreatic cancer (HCC)      HISTORY OF PRESENTING ILLNESS:  Mariah Henderson 70 y.o. female is a here because of suspicious pancreatic and liver masses. The patient was referred by her PCP, Dr Daryll Drown. The patient presents to the clinic today accompanied by her sister Enid Derry.   She was having abdominal pain for the past month along with sudden loss of appetite and weight loss since October. She has overall lost 10 pounds. Her abdominal pain is 5/10. She is fine to use OTC pain medication as needed. Her blood work 2 months ago was normal, but LFTs on 01/05/20 labs were abnormal. Her CT AP showed pancreatic and liver masses concerning for cancer. She notes she still has adequate energy and her appetite is starting to return.  Today she denies vision, chest pain. She was prescribed hydrocodone but did not pick it up.   Socially she lives along in 1 level home. She has 1 adult son in [place]. She is retired from cooking and custodial work. She has a twin along with 3 sisters in town and other siblings and relative in [place]. She does not drink and smoked about 10 cigarettes for 20 years, quit in 1998.   She has personal medical history of Anxiety, Asthma, Borderline Diabetic, Depression, GERD, Heart murmur, HLD, HTN, arthritis . I reviewed medication with her. She is no longer on Zoloft. She only uses risperdal as needed for her arthritis.  She notes she has anterior cervical spine surgery in the past and foot surgery. She notes nephew had colon cancer in 22s and sister with breast cancer in late 69s.    REVIEW OF SYSTEMS:    Constitutional: Denies fevers, chills or abnormal night sweats (+) Low appetite and weight loss  Eyes: Denies blurriness of  vision, double vision or watery eyes Ears, nose, mouth, throat, and face: Denies mucositis or sore throat Respiratory: Denies cough, dyspnea or wheezes Cardiovascular: Denies palpitation, chest discomfort or lower extremity  swelling Gastrointestinal:  Denies nausea, heartburn or change in bowel habits (+) Abdominal pain, 5/10 Skin: Denies abnormal skin rashes Lymphatics: Denies new lymphadenopathy or easy bruising Neurological:Denies numbness, tingling or new weaknesses Behavioral/Psych: Mood is stable, no new changes  All other systems were reviewed with the patient and are negative.  MEDICAL HISTORY:  Past Medical History:  Diagnosis Date  . Acute medial meniscus tear    right knee  . Anxiety   . Asthma   . Cervical spondylosis   . Chronic serous otitis media   . Constipation   . COPD (chronic obstructive pulmonary disease) ([place])   . Depression    followed by [name]; no longer of depakote, seroquel  . Diabetes mellitus without complication ([place])   . Dysuria 12/27/2009   Qualifier: Diagnosis of  By: [name] MD, Amanjot    . Foot drop   . GERD (gastroesophageal reflux disease)   . Heart murmur   . Hyperlipidemia   . Hypertension   . Low back pain   . Obesity   . Paraumbilical hernia   . Pre-diabetes     SURGICAL HISTORY: Past Surgical History:  Procedure Laterality Date  . ABDOMINAL HYSTERECTOMY    . ANTERIOR CERVICAL DECOMP/DISCECTOMY FUSION N/A 10/05/2014   Procedure: ACDF - C5-C6 - C6-C7;  Surgeon: Karie Chimera, MD;  Location: [facility];  Service: Neurosurgery;  Laterality: N/A;  ACDF - C5-C6 - C6-C7  . CHOLECYSTECTOMY    . COLONOSCOPY    . FOOT SURGERY    . HERNIA REPAIR  1999  . KNEE ARTHROSCOPY WITH MEDIAL MENISECTOMY Right 06/14/2016   Procedure: RIGHT KNEE ARTHROSCOPY WITH PARTIAL MEDIAL MENISCECTOMY, SUBCHONDROPLASTY;  Surgeon: Leandrew Koyanagi, MD;  Location: [facility];  Service: Orthopedics;  Laterality: Right;  . KNEE ARTHROSCOPY WITH SUBCHONDROPLASTY Right 06/14/2016   Procedure: KNEE ARTHROSCOPY WITH SUBCHONDROPLASTY;  Surgeon: Leandrew Koyanagi, MD;  Location: [facility];  Service: Orthopedics;  Laterality: Right;  . RADIOACTIVE SEED GUIDED  EXCISIONAL BREAST BIOPSY Left 06/23/2014   Procedure: RADIOACTIVE SEED GUIDED EXCISIONAL BREAST BIOPSY;  Surgeon: Stark Klein, MD;  Location: [facility];  Service: General;  Laterality: Left;    SOCIAL HISTORY: Social History   Socioeconomic History  . Marital status: Divorced    Spouse name: Not on file  . Number of children: 1  . Years of education: Not on file  . Highest education level: Not on file  Occupational History  . Occupation: retired   Tobacco Use  . Smoking status: Former Smoker    Packs/day: 0.50    Years: 20.00    Pack years: 10.00    Types: Cigarettes    Quit date: 01/17/1996    Years since quitting: 24.0  . Smokeless tobacco: Never Used  Substance and Sexual Activity  . Alcohol use: No    Alcohol/week: 0.0 standard drinks  . Drug use: No  . Sexual activity: Not Currently  Other Topics Concern  . Not on file  Social History Narrative  . Not on file   Social Determinants of Health   Financial Resource Strain: Not on file  Food Insecurity: Not on file  Transportation Needs: Not on file  Physical Activity: Not on file  Stress: Not on file  Social Connections: Not on file  Intimate Partner Violence: Not on file    FAMILY HISTORY: Family History  Problem Relation Age of Onset  . Cancer Sister 76       breast cancer   . Cancer Nephew 66       colon cancer  . Colon cancer Neg Hx     ALLERGIES:  has No Known Allergies.  MEDICATIONS:  Current Outpatient Medications  Medication Sig Dispense Refill  . albuterol (PROVENTIL) (2.5 MG/3ML) 0.083% nebulizer solution Take 3 mLs (2.5 mg total) by nebulization every 6 (six) hours as needed for wheezing. 1080 mL 3  . albuterol (VENTOLIN HFA) 108 (90 Base) MCG/ACT inhaler INHALE 1 TO 2 PUFFS INTO THE LUNGS EVERY 6 HOURS AS NEEDED FOR WHEEZING OR SHORTNESS OF BREATH 54 g 1  . alendronate (FOSAMAX) 70 MG tablet TAKE 1 TABLET BY MOUTH EVERY 7 DAYS. TAKE WITH A FULL GLASS OF WATER ON AN EMPTY STOMACH.  12 tablet 3  . amoxicillin-clavulanate (AUGMENTIN) 875-125 MG tablet Take 1 tablet by mouth 2 (two) times daily for 10 days. 20 tablet 0  . atorvastatin (LIPITOR) 40 MG tablet Take 1 tablet (40 mg total) by mouth daily. 90 tablet 3  . benazepril (LOTENSIN) 20 MG tablet TAKE 1 TABLET(20 MG) BY MOUTH DAILY 90 tablet 3  . calcium carbonate (OS-CAL) 600 MG TABS tablet Take by mouth.    . celecoxib (CELEBREX) 100 MG capsule TAKE 1 CAPSULE(100 MG) BY MOUTH TWICE DAILY 90 capsule 1  . cholecalciferol (VITAMIN D) 1000 units tablet Take 2,000 Units by mouth daily.    . diclofenac Sodium (VOLTAREN) 1 % GEL Apply 2 g topically 4 (four) times daily. 100 g 2  . famotidine (PEPCID) 40 MG tablet TAKE 1/2 TABLET(20 MG) BY MOUTH TWICE DAILY 30 tablet 3  . fluticasone (FLONASE) 50 MCG/ACT nasal spray Place 1 spray into both nostrils daily. 16 g 3  . Fluticasone-Salmeterol (ADVAIR DISKUS) 100-50 MCG/DOSE AEPB Inhale 1 puff into the lungs 2 (two) times daily. 60 each 11  . furosemide (LASIX) 20 MG tablet take 1 tablet by mouth once daily 30 tablet 11  . hydrochlorothiazide (MICROZIDE) 12.5 MG capsule TAKE 1 CAPSULE(12.5 MG) BY MOUTH DAILY 90 capsule 1  . HYDROcodone-acetaminophen (NORCO/VICODIN) 5-325 MG tablet Take 1 tablet by mouth 2 (two) times daily as needed for severe pain. 30 tablet 0  . ipratropium (ATROVENT) 0.02 % nebulizer solution Take 0.5 mg by nebulization every 6 (six) hours as needed for wheezing or shortness of breath.    . latanoprost (XALATAN) 0.005 % ophthalmic solution PLACE 1 DROP INTO BOTH EYES AT BEDTIME  0  . loperamide (LOPERAMIDE A-D) 2 MG tablet Take 1 tablet (2 mg total) by mouth 2 (two) times daily as needed for diarrhea or loose stools. 6 tablet 0  . loratadine (CLARITIN) 10 MG tablet Take 1 tablet (10 mg total) by mouth daily. 30 tablet 11  . meclizine (ANTIVERT) 12.5 MG tablet TAKE 1 TABLET(12.5 MG) BY MOUTH TWICE DAILY AS NEEDED FOR DIZZINESS 15 tablet 3  . ondansetron (ZOFRAN) 4 MG  tablet Take 1-2 tablets (4-8 mg total) by mouth every 8 (eight) hours as needed for nausea or vomiting. 15 tablet 0  . ONETOUCH ULTRA test strip USE TO TEST BLOOD SUGAR  ONCE DAILY 50 strip 6  . risperiDONE (RISPERDAL) 0.25 MG tablet Take 0.25 mg by mouth daily as needed.    . sitaGLIPtin (JANUVIA) 25 MG tablet Take 1 tablet (25 mg total) by mouth daily. 90 tablet  1  . traZODone (DESYREL) 50 MG tablet Take 25-50 mg by mouth at bedtime as needed for sleep.     Marland Kitchen triamcinolone cream (KENALOG) 0.1 % apply to affected area twice a day 454 g 1   No current facility-administered medications for this visit.    PHYSICAL EXAMINATION: ECOG PERFORMANCE STATUS: 1 - Symptomatic but completely ambulatory  Vitals:   01/21/20 1456  BP: (!) 163/82  Pulse: 84  Resp: 18  Temp: 97.8 F (36.6 C)  SpO2: 99%   Filed Weights   01/21/20 1456  Weight: 147 lb 12.8 oz (67 kg)    GENERAL:alert, no distress and comfortable SKIN: skin color, texture, turgor are normal, no rashes or significant lesions EYES: normal, Conjunctiva are pink and non-injected, sclera clear  NECK: supple, thyroid normal size, non-tender, without nodularity LYMPH:  no palpable lymphadenopathy in the cervical, axillary  LUNGS: clear to auscultation and percussion with normal breathing effort HEART: regular rate & rhythm and no murmurs and no lower extremity edema ABDOMEN:abdomen soft, non-tender and normal bowel sounds (+) Right abdominal tenderness and over pancreas, no hepatomegaly  Musculoskeletal:no cyanosis of digits and no clubbing  NEURO: alert & oriented x 3 with fluent speech, no focal motor/sensory deficits  LABORATORY DATA:  I have reviewed the data as listed CBC Latest Ref Rng & Units 11/13/2019 10/04/2018 12/12/2017  WBC 3.4 - 10.8 x10E3/uL 5.6 6.2 7.3  Hemoglobin 11.1 - 15.9 g/dL 12.8 13.0 13.4  Hematocrit 34.0 - 46.6 % 39.4 39.5 41.9  Platelets 150 - 450 x10E3/uL 279 259 311    CMP Latest Ref Rng & Units  12/26/2019 11/13/2019 09/05/2019  Glucose 65 - 99 mg/dL 138(H) 92 170(H)  BUN 8 - 27 mg/dL '13 10 11  ' Creatinine 0.57 - 1.00 mg/dL 0.78 0.74 0.80  Sodium 134 - 144 mmol/L 139 141 141  Potassium 3.5 - 5.2 mmol/L 4.4 4.2 4.6  Chloride 96 - 106 mmol/L 96 101 101  CO2 20 - 29 mmol/L '29 28 27  ' Calcium 8.7 - 10.3 mg/dL 9.8 9.6 9.7  Total Protein 6.0 - 8.5 g/dL 6.7 6.4 -  Total Bilirubin 0.0 - 1.2 mg/dL 0.3 0.3 -  Alkaline Phos 44 - 121 IU/L 210(H) 122(H) -  AST 0 - 40 IU/L 94(H) 30 -  ALT 0 - 32 IU/L 86(H) 25 -    RADIOGRAPHIC STUDIES: I have personally reviewed the radiological images as listed and agreed with the findings in the report. US Abdomen Complete  Result Date: 01/13/2020 CLINICAL DATA:  abdominal pain, abnormal liver enzymes, epigastric pain, weight loss since 9/21 EXAM: ABDOMEN ULTRASOUND COMPLETE COMPARISON:  CT abdomen pelvis 01/13/2020. FINDINGS: Gallbladder: Status post cholecystectomy. Common bile duct: Diameter: 9 mm Liver: There is a 2.5 x 2 x 2.5 cm solid hypoechoic area demonstrating internal vascular flow. There are a couple of other similar lesions measuring 1.8 x 1.2 x 1.5 cm within the right and 0.9 x 1 x 0.9 cm within left hepatic lobes. Heterogeneous parenchymal echogenicity. Portal vein is patent on color Doppler imaging with normal direction of blood flow towards the liver. IVC: No abnormality visualized. Pancreas: Suggestion of a hypoechoic mass within the tail of the pancreas that is not well visualized. Spleen: Size and appearance within normal limits. Right Kidney: Length: 10 cm. Echogenicity within normal limits. No mass or hydronephrosis visualized. Left Kidney: Length: 8.9 cm. Echogenicity within normal limits. No mass or hydronephrosis visualized. Abdominal aorta: No aneurysm visualized.  Atherosclerosis. Other findings: None. IMPRESSION: 1. Suggestion  of 3 vascular hypoechoic mass within the liver measuring up to 2.5 cm. 2. Suggestion of a hypoechoic mass within  the tail of the pancreas that is not well visualized. 3. Findings concerning for malignancy, please see separately dictated CT abdomen pelvis 01/13/2020. These results will be called to the ordering clinician or representative by the Radiologist Assistant, and communication documented in the PACS or [employer]. Electronically Signed   By: Iven Finn M.D.   On: 01/13/2020 23:50   CT Abdomen Pelvis W Contrast  Result Date: 01/14/2020 CLINICAL DATA:  Epigastric pain and weight loss EXAM: CT ABDOMEN AND PELVIS WITH CONTRAST TECHNIQUE: Multidetector CT imaging of the abdomen and pelvis was performed using the standard protocol following bolus administration of intravenous contrast. CONTRAST:  145m OMNIPAQUE IOHEXOL 300 MG/ML  SOLN COMPARISON:  Multiple exams, including ultrasound of 01/13/2020 as well as overlapping portions of CT chest from 04/10/2017 and 11/22/2017, and CT abdomen from 09/24/2005 FINDINGS: Lower chest: Stable scarring in both lower lobes. Mild distal esophageal wall thickening. Hepatobiliary: Hypoenhancing hepatic masses are new compared to 11/22/2017 and are scattered throughout the liver. Index lesion in the right hepatic lobe 2.1 by 2.1 cm on image 14 of series 3. These masses demonstrate internal vascularity on recent ultrasound and are highly concerning for metastatic lesions. Cholecystectomy noted. Common bile duct measures up to 0.7 cm in diameter, borderline prominent but probably a physiologic response to cholecystectomy. Pancreas: A new mass is present in the pancreatic tail near the junction of the pancreatic body and tail. --- Pancreatic lesion characteristics: Size: 2.0 by 1.8 by 1.7 cm Location: Pancreatic tail Characterization: Solid Enhancement: Hypoenhancing Other Characteristics: Associated dorsal pancreatic duct dilatation distal to the mass, as well as pancreatic atrophy distal to the mass. Local extent of mass: The mass is tangential to the posterior gastric wall  but without definite gastric invasion. Vascular Involvement: The mass abuts the splenic vein which appears narrowed/attenuated compared to prior exams. The upper portion of the mass abuts the splenic artery. Variant hepatic artery anatomy: Conventional Bile Duct Involvement: Absent Variant biliary anatomy: Not observed Adjacent Nodes: Absent Omental/Peritoneal Disease: 0.8 by 0.8 cm tumor nodule versus dense diverticulum above the transverse colon on image 44 of series 3. Distant Metastases: Liver --- Spleen: Unremarkable Adrenals/Urinary Tract: 3.4 by 2.0 cm left adrenal mass, stable from 09/24/2005, previously shown to be adenoma based on density. Stomach/Bowel: Sigmoid colon diverticulosis. Vascular/Lymphatic: Aortoiliac atherosclerotic vascular disease. Gastric venous collateral vessels. Reproductive: Uterus absent. Adnexa unremarkable. Other: Rim calcifications along the inferior margin of the liver are unchanged from 09/24/2005 and are likely either spilled gallstones or postinflammatory. Musculoskeletal: Lumbar and thoracic spondylosis and degenerative disc disease contributing to multilevel impingement. IMPRESSION: 1. Hypoenhancing 2.0 cm in long axis mass in the pancreatic tail compatible with pancreatic adenocarcinoma. The mass abuts the posterior gastric wall but without definite gastric invasion. The splenic vein appears narrowed/attenuated compared to prior exams and the lesion abuts the margin of the splenic artery. 2. Hypoenhancing hepatic masses are new compared to 11/22/2017 and are highly suspicious for metastatic lesions. 3. Questionable 0.8 by 0.8 cm omental tumor nodule versus dense diverticulum above the transverse colon. 4. Other imaging findings of potential clinical significance: Mild distal esophageal wall thickening, query esophagitis. Stable left adrenal adenoma. Sigmoid colon diverticulosis. Lumbar and thoracic spondylosis and degenerative disc disease contributing to multilevel  impingement. 5. Aortic atherosclerosis. Aortic Atherosclerosis (ICD10-I70.0). Electronically Signed   By: WVan ClinesM.D.   On: 01/14/2020 08:59  ASSESSMENT & PLAN:  Mariah Henderson is a 70 y.o. African-American female with a history of Anxiety, Asthma, Borderline Diabetic, Depression, GERD, Heart murmur, HLD, HTN, arthritis   1. Pancreatic and Liver masses concerning for metastatic pancreatic cancer  -I discussed and personally reviewed her image findings with patient and her sister in great detail. Her CT AP from 01/13/20 showed 2cm mass on tail of pancreas. The mass abuts the posterior gastric wall but without definite gastric invasion. Scan also shows multiple (at least 5) hepatic masses highly suspicious for metastatic lesions and Questionable 0.8 by 0.8 cm omental tumor nodule versus dense diverticulum above the transverse colon. -This presentation is highly suspicious of pancreatic adenocarcinoma with liver metastasis. For definitive diagnosis, I recommend liver biopsy. I also recommend CT chest to evaluate for distant metastasis and complete staging. She is agreeable.  -I discussed if her liver biopsy confirms pancreatic metastasis, her cancer would be stage IV and no longer curable or eligible for surgery. I discussed this is still treatable, mainly with systemic chemotherapy if this is adenocarcinoma.  -I will obtain genomic testing such as FO on her biopsy sample and genetic testing to see if she is eligible for target or immunotherapy. She is agreeable.  -I discussed option of participating in [employer] study. She is interested.  -F/u 2 days after biopsy    2. Abdominal Pain, low appetite, weight loss, Transmanitis  -She has had abdominal pain for the past month, 5/10. She was prescribed hydrocodone but did not pick up. She is fine to use OTC pain medication.  -She also has had sudden loss of appetite and weight loss since October 2021. She has overall lost 10 pounds.   -Her blood work 2 months ago was normal, but LFTs on 01/05/20 labs were abnormal with Alk Phos 210, AST 94, ALT 86, normal Tbili.    3. Comorbidities: Asthma, Borderline Diabetic, GERD, Heart murmur, HLD, HTN, arthritis  -On medications, including multiple diuretics.  -Managed by PCP.   4. Social support -She lives alone in 1 level house and she is retired. She has 1 adult son who lives in [place]. She has a twin and 3 other sisters who live in [place], and other siblings and relative in [place].  -She notes having faith and maintaining positive outlook.    PLAN:  -CT Chest in 1-2 weeks  -IR Liver biopsy in 1-2 weeks -F/u 2 days after biopsy  -genetic referral    Orders Placed This Encounter  Procedures  . CT Chest Wo Contrast    Standing Status:   Future    Standing Expiration Date:   01/20/2021    Order Specific Question:   Preferred imaging location?    Answer:   [facility]  . [place] CORE BIOPSY (LIVER)    Standing Status:   Future    Standing Expiration Date:   01/20/2021    Order Specific Question:   Lab orders requested (DO NOT place separate lab orders, these will be automatically ordered during procedure specimen collection):    Answer:   Surgical Pathology    Order Specific Question:   Reason for Exam (SYMPTOM  OR DIAGNOSIS REQUIRED)    Answer:   confirm malignancy    Order Specific Question:   Preferred location?    Answer:   [facility]    Order Specific Question:   Release to patient    Answer:   Immediate  . Ambulatory referral to Social Work    Referral  Priority:   Routine    Referral Type:   Consultation    Referral Reason:   Specialty Services Required    Number of Visits Requested:   1  . Ambulatory referral to Genetics    Referral Priority:   Routine    Referral Type:   Consultation    Referral Reason:   Specialty Services Required    Number of Visits Requested:   1    All questions were answered. The patient knows to call the clinic with  any problems, questions or concerns. The total time spent in the appointment was 60 minutes.     Truitt Merle, MD 01/21/2020   I, Joslyn Devon, am acting as scribe for Truitt Merle, MD.   I have reviewed the above documentation for accuracy and completeness, and I agree with the above.

## 2020-01-21 ENCOUNTER — Inpatient Hospital Stay: Payer: Medicare Other | Attending: Hematology | Admitting: Hematology

## 2020-01-21 ENCOUNTER — Other Ambulatory Visit: Payer: Self-pay

## 2020-01-21 ENCOUNTER — Telehealth: Payer: Self-pay | Admitting: *Deleted

## 2020-01-21 ENCOUNTER — Encounter: Payer: Self-pay | Admitting: Hematology

## 2020-01-21 VITALS — BP 163/82 | HR 84 | Temp 97.8°F | Resp 18 | Ht 60.0 in | Wt 147.8 lb

## 2020-01-21 DIAGNOSIS — J45909 Unspecified asthma, uncomplicated: Secondary | ICD-10-CM | POA: Diagnosis not present

## 2020-01-21 DIAGNOSIS — I10 Essential (primary) hypertension: Secondary | ICD-10-CM | POA: Diagnosis not present

## 2020-01-21 DIAGNOSIS — R630 Anorexia: Secondary | ICD-10-CM

## 2020-01-21 DIAGNOSIS — R634 Abnormal weight loss: Secondary | ICD-10-CM

## 2020-01-21 DIAGNOSIS — K869 Disease of pancreas, unspecified: Secondary | ICD-10-CM

## 2020-01-21 DIAGNOSIS — Z800 Family history of malignant neoplasm of digestive organs: Secondary | ICD-10-CM

## 2020-01-21 DIAGNOSIS — R7401 Elevation of levels of liver transaminase levels: Secondary | ICD-10-CM

## 2020-01-21 DIAGNOSIS — C252 Malignant neoplasm of tail of pancreas: Secondary | ICD-10-CM | POA: Diagnosis not present

## 2020-01-21 DIAGNOSIS — C787 Secondary malignant neoplasm of liver and intrahepatic bile duct: Secondary | ICD-10-CM | POA: Diagnosis not present

## 2020-01-21 DIAGNOSIS — M1990 Unspecified osteoarthritis, unspecified site: Secondary | ICD-10-CM

## 2020-01-21 DIAGNOSIS — K8689 Other specified diseases of pancreas: Secondary | ICD-10-CM

## 2020-01-21 DIAGNOSIS — Z87891 Personal history of nicotine dependence: Secondary | ICD-10-CM | POA: Diagnosis not present

## 2020-01-21 DIAGNOSIS — Z803 Family history of malignant neoplasm of breast: Secondary | ICD-10-CM

## 2020-01-21 DIAGNOSIS — K219 Gastro-esophageal reflux disease without esophagitis: Secondary | ICD-10-CM | POA: Diagnosis not present

## 2020-01-21 DIAGNOSIS — R109 Unspecified abdominal pain: Secondary | ICD-10-CM

## 2020-01-21 DIAGNOSIS — R160 Hepatomegaly, not elsewhere classified: Secondary | ICD-10-CM

## 2020-01-21 DIAGNOSIS — C259 Malignant neoplasm of pancreas, unspecified: Secondary | ICD-10-CM | POA: Insufficient documentation

## 2020-01-21 NOTE — Progress Notes (Signed)
Met with patient and her sister Regino Schultze today prior to her medical oncology consult with Dr. Truitt Merle.  I had previously spoken to the patient via telephone.  I explained my role as nurse navigator and they were given my direct contact information.

## 2020-01-21 NOTE — Research (Signed)
[employer] 2018-01 STUDY: BLOOD SAMPLE COLLECTION TO EVALUATE BIOMARKERS IN SUBJECTS WITH UNTREATED SOLID TUMORS  [employer] 2018-01B: STOOL SAMPLE COLLECTION TO EVALUATE BIOMARKERS IN SUBJECTS WITH UNTREATED SOLID TUMORS  01/21/2020 15:55PM  REVIEW CONSENT: Referral received from Dr. Burr Medico. Met with patient Mariah Henderson and her sister Mariah Henderson in exam room 22 for approximately 30 minutes to discuss potential participation in the [employer] blood sample study and the stool specimen substudy following her appointment with Dr. Burr Medico.  Consent and HIPAAs forms for the blood sample study were reviewed in their entirety, including the blood draw procedure, data collection questions, alternatives to participation, and potential risks and benefits. Mariah Henderson verbalizes understanding and denies any questions at this time. The consent and HIPAA forms for the stool specimen substudy were also reviewed in their entirety: Mariah Henderson has no questions. Mariah Henderson verbalizes a desire to potentially voluntarily participate in both studies but would like to take the consents home for additional review and discussion with her family. Two copies of each form were provided along with my business card with direct contact information should she have any questions. Mariah Henderson was advised that if desired, we can schedule a meeting to sign consent, complete the history questionnaire, collect the blood specimen and review stool specimen collection during her next visit to the center. She understands that there is a $50 compensation for each study once specimens are collected.  The current plan is to confirm eligibility for participation and provide a follow-up phone call this Friday (01/23/20) to answer questions.   Dionne Bucy. Sharlett Iles, BSN, RN, CIC 01/21/2020 4:49 PM

## 2020-01-21 NOTE — Telephone Encounter (Signed)
That is not a problem.  Can you please advise her to cease the medication and we can decide about further DM treatment at our next visit.

## 2020-01-21 NOTE — Telephone Encounter (Signed)
Left detailed message on patient's self-identified VM with information below. Kinnie Feil, BSN, RN-BC

## 2020-01-21 NOTE — Telephone Encounter (Signed)
Patient called in stating she will not take the newly prescribed Januvia as everything she and her family have read states it causes the type of cancer she has. She is requesting a different med. Kinnie Feil, BSN, RN-BC

## 2020-01-22 ENCOUNTER — Encounter (HOSPITAL_COMMUNITY): Payer: Self-pay | Admitting: Radiology

## 2020-01-22 ENCOUNTER — Encounter: Payer: Self-pay | Admitting: General Practice

## 2020-01-22 NOTE — Progress Notes (Signed)
Mariah Henderson Female, 70 y.o., 26-May-1950  MRN:  [id number] Phone:  [phone] (H)       PCP:  Inez Catalina, MD Coverage:  [place] [employer] Medicare/[employer] Medicare            RE: [place] CORE BIOPSY (LIVER) Received: Vidal Schwalbe, Marijean Niemann, DO  Merilynn Finland Ir Procedure Requests  OK for US guided liver mass biopsy.   [address]        Previous Messages   ----- Message -----  From: Henry Russel D  Sent: 01/21/2020  4:32 PM EST  To: Ir Procedure Requests  Subject: [place] CORE BIOPSY (LIVER)              Procedure:  [place] CORE BIOPSY (LIVER)   Reason: Malignant neoplasm of tail of pancreas, confirm malignancy   History: CT, [place] in computer   Provider: Malachy Mood   Provider Contact:  [phone]

## 2020-01-22 NOTE — Progress Notes (Signed)
[facility] Clinical Social Work  Initial Assessment   Mariah Henderson is a 70 y.o. year old femaleassessed by phone. Clinical Social Work was referred by oncologistfor assessment of psychosocial needs.   SDOH (Social Determinants of Health) assessments performed: Yes SDOH Interventions   Flowsheet Row Most Recent Value  SDOH Interventions   Housing Interventions Intervention Not Indicated      Distress Screen completed: Yes ONCBCN DISTRESS SCREENING 01/21/2020  Screening Type Initial Screening  Distress experienced in past week (1-10) 5  Emotional problem type Adjusting to illness  Information Concerns Type Lack of info about diagnosis;Lack of info about treatment  Physical Problem type Pain;Loss of appetitie  Physician notified of physical symptoms Yes  Referral to clinical psychology No  Referral to clinical social work Yes  Referral to dietition Yes  Referral to financial advocate Yes  Referral to support programs No  Referral to palliative care No    Family/Social Information:  . Housing Arrangement: patient lives alone, has Section 8 housing voucher.  Pays utilities and income based rent.  . Family members/support persons in your life? Is widowed, has several sisters who are involved in supporting her.  They are in regular contact.  Younger sister helps w transportation, another sister is a breast cancer survivor and has been an encouragement for her.  . Transportation concerns: Sister can drive on Wednesdays, will need help other days.  CSW will refer to [employer] and she can schedule rides through this service as needed.    . 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 Income source: retirement funds, Sales executive . Financial concerns: Yes, current concerns o Type of concern: Medical bills and she is not sure what her needs will be . Food access concerns: None, gets Sales executive . Religious or spiritual practice: Regular church attender,  relies on her faith resources for support . Medication Concerns: none . Services Currently in place:  none  Coping/ Adjustment to diagnosis: . Patient understands treatment plan and what happens next? Patient is awaiting biopsy for accurate diagnosis and treatment planning.  Feels her situation is "in God's hands", "trusting him for healing."  Not overly anxious at this point.  "I try not to worry", "take it one day at a time." . Concerns about diagnosis and/or treatment: I'm not especially worried about anything . Patient reported stressors: none at this time . Hopes and priorities: healing . Patient enjoys time with family/ friends and church activities . Current coping skills/ strengths: Capable of independent living, Communication skills and Religious Affiliation    SUMMARY: Current SDOH Barriers:  . none at this time   Interventions: . Discussed common feeling and emotions when being diagnosed with cancer, and the importance of support during treatment . Informed patient of the support team roles and support services at [facility] . Provided CSW contact information and encouraged patient to call with any questions or concerns . Referred patient to Transportation Services   Follow Up Plan: Patient will contact CSW with any support or resource needs, mailed her packet of information on Support Center Patient verbalizes understanding of plan: Yes   Sallee Lange, LCSW   Mariah Genera, LCSW Clinical Social Worker Phone:  [phone]

## 2020-01-23 ENCOUNTER — Telehealth: Payer: Self-pay

## 2020-01-23 NOTE — Telephone Encounter (Signed)
Patient called back. Relayed info below from PCP. Patient scheduled f/u appt for 01/30/2020 at 9:15 to discuss alternate med for diabetes. Hubbard Hartshorn, BSN, RN-BC

## 2020-01-23 NOTE — Telephone Encounter (Signed)
Left voice message for patient regarding follow up with Dr. Burr Medico which has been scheduled for next Friday 01/30/2020 at 12 noon to arrive 10 to 15 minutes early for check in.  I left my direct number should she have any questions to call me back.

## 2020-01-26 ENCOUNTER — Other Ambulatory Visit: Payer: Self-pay | Admitting: Student

## 2020-01-26 ENCOUNTER — Other Ambulatory Visit: Payer: Self-pay | Admitting: Radiology

## 2020-01-26 ENCOUNTER — Telehealth: Payer: Self-pay

## 2020-01-26 NOTE — Telephone Encounter (Signed)
EXACT SCI[employer] 2018-01 STUDY: BLOOD SAMPLE COLLECTION TO EVALUATE BIOMARKERS INSUBJECTS WITH UNTREATED SOLID TUMORS  EX[employer] 2018-01B: STOOL SAMPLE COLLECTION TO EVALUATE BIOMARKERS IN SUBJECTS WITH UNTREATED SOLID TUMORS  01/26/2020 12:00PM  FOLLOWUP PHONE CALL: Outgoing call to [place] as a follow-up to our consent conversation last week. I confirmed that I was speaking with the correct person and we spoke for a little over five minutes. I explained that we would need biopsy results before proceeding with participation in the above studies since concurrent diagnosis of multiple primary cancers is not allowed: Mariah Henderson understanding. She is currently scheduled for a core needle biopsy the Wednesday (01/28/20). She has an appointment scheduled with Dr. Burr Medico on Friday (01/30/19) but the protocol requires at least three days between biopsy and blood specimen collection. Mariah Henderson does have a chest CT scheduled at [facility] on Monday, 02/02/19 and verbalizes interest in signing consent and providing a blood sample prior to her CT. Mariah Henderson verbalizes interest in participating in the blood sample collection but does not wish to participate in the stool sample substudy. Based on her biopsy results, Mariah Henderson is agreeable to scheduling a meeting to sign consent, complete the history questionnaire and collect the blood specimen prior to her chest CT on Monday, 02/02/20. I advised her that I will either touch base with her at the center on Friday or call her with definitive plans and she agrees with that plan. Mariah Henderson is thanked for her time and consideration of participation in the [employer] studies and is encouraged to contact me directly with any concerns or questions: she verbalizes understanding.  Dionne Bucy. Sharlett Iles, BSN, RN, CIC 01/26/2020 12:15 PM

## 2020-01-27 ENCOUNTER — Telehealth: Payer: Self-pay | Admitting: Genetic Counselor

## 2020-01-27 ENCOUNTER — Other Ambulatory Visit: Payer: Self-pay | Admitting: Radiology

## 2020-01-27 NOTE — Telephone Encounter (Signed)
Scheduled appt per 1/10 staff msg. Pt confirmed appt date and time.

## 2020-01-28 ENCOUNTER — Ambulatory Visit (HOSPITAL_COMMUNITY)
Admission: RE | Admit: 2020-01-28 | Discharge: 2020-01-28 | Disposition: A | Discharge status: Home / Self Care | Payer: Medicare Other | Source: Ambulatory Visit | Attending: Hematology | Admitting: Hematology

## 2020-01-28 ENCOUNTER — Other Ambulatory Visit: Payer: Self-pay | Admitting: Hematology

## 2020-01-28 ENCOUNTER — Other Ambulatory Visit: Payer: Self-pay

## 2020-01-28 ENCOUNTER — Encounter (HOSPITAL_COMMUNITY): Payer: Self-pay

## 2020-01-28 DIAGNOSIS — F329 Major depressive disorder, single episode, unspecified: Secondary | ICD-10-CM | POA: Insufficient documentation

## 2020-01-28 DIAGNOSIS — C787 Secondary malignant neoplasm of liver and intrahepatic bile duct: Secondary | ICD-10-CM | POA: Insufficient documentation

## 2020-01-28 DIAGNOSIS — Z7984 Long term (current) use of oral hypoglycemic drugs: Secondary | ICD-10-CM | POA: Diagnosis not present

## 2020-01-28 DIAGNOSIS — K7689 Other specified diseases of liver: Secondary | ICD-10-CM | POA: Diagnosis not present

## 2020-01-28 DIAGNOSIS — C252 Malignant neoplasm of tail of pancreas: Secondary | ICD-10-CM | POA: Diagnosis not present

## 2020-01-28 DIAGNOSIS — K219 Gastro-esophageal reflux disease without esophagitis: Secondary | ICD-10-CM | POA: Diagnosis not present

## 2020-01-28 DIAGNOSIS — E119 Type 2 diabetes mellitus without complications: Secondary | ICD-10-CM | POA: Insufficient documentation

## 2020-01-28 DIAGNOSIS — E785 Hyperlipidemia, unspecified: Secondary | ICD-10-CM | POA: Diagnosis not present

## 2020-01-28 DIAGNOSIS — I10 Essential (primary) hypertension: Secondary | ICD-10-CM | POA: Insufficient documentation

## 2020-01-28 DIAGNOSIS — Z87891 Personal history of nicotine dependence: Secondary | ICD-10-CM | POA: Insufficient documentation

## 2020-01-28 DIAGNOSIS — Z79899 Other long term (current) drug therapy: Secondary | ICD-10-CM | POA: Insufficient documentation

## 2020-01-28 DIAGNOSIS — C229 Malignant neoplasm of liver, not specified as primary or secondary: Secondary | ICD-10-CM | POA: Diagnosis not present

## 2020-01-28 DIAGNOSIS — K769 Liver disease, unspecified: Secondary | ICD-10-CM | POA: Diagnosis present

## 2020-01-28 DIAGNOSIS — J449 Chronic obstructive pulmonary disease, unspecified: Secondary | ICD-10-CM | POA: Insufficient documentation

## 2020-01-28 HISTORY — PX: IR US GUIDANCE: IMG2393

## 2020-01-28 LAB — CBC WITH DIFFERENTIAL/PLATELET
Abs Immature Granulocytes: 0.02 10*3/uL (ref 0.00–0.07)
Basophils Absolute: 0 10*3/uL (ref 0.0–0.1)
Basophils Relative: 1 %
Eosinophils Absolute: 0.3 10*3/uL (ref 0.0–0.5)
Eosinophils Relative: 5 %
HCT: 43.1 % (ref 36.0–46.0)
Hemoglobin: 13.7 g/dL (ref 12.0–15.0)
Immature Granulocytes: 0 %
Lymphocytes Relative: 8 %
Lymphs Abs: 0.5 10*3/uL — ABNORMAL LOW (ref 0.7–4.0)
MCH: 27.8 pg (ref 26.0–34.0)
MCHC: 31.8 g/dL (ref 30.0–36.0)
MCV: 87.4 fL (ref 80.0–100.0)
Monocytes Absolute: 0.8 10*3/uL (ref 0.1–1.0)
Monocytes Relative: 12 %
Neutro Abs: 5.1 10*3/uL (ref 1.7–7.7)
Neutrophils Relative %: 74 %
Platelets: 242 10*3/uL (ref 150–400)
RBC: 4.93 MIL/uL (ref 3.87–5.11)
RDW: 13.2 % (ref 11.5–15.5)
WBC: 6.8 10*3/uL (ref 4.0–10.5)
nRBC: 0 % (ref 0.0–0.2)

## 2020-01-28 LAB — COMPREHENSIVE METABOLIC PANEL
ALT: 83 U/L — ABNORMAL HIGH (ref 0–44)
AST: 88 U/L — ABNORMAL HIGH (ref 15–41)
Albumin: 3.9 g/dL (ref 3.5–5.0)
Alkaline Phosphatase: 306 U/L — ABNORMAL HIGH (ref 38–126)
Anion gap: 15 (ref 5–15)
BUN: 13 mg/dL (ref 8–23)
CO2: 28 mmol/L (ref 22–32)
Calcium: 10 mg/dL (ref 8.9–10.3)
Chloride: 95 mmol/L — ABNORMAL LOW (ref 98–111)
Creatinine, Ser: 0.76 mg/dL (ref 0.44–1.00)
GFR, Estimated: >60 mL/min (ref >60)
Glucose, Bld: 162 mg/dL — ABNORMAL HIGH (ref 70–99)
Potassium: 3.7 mmol/L (ref 3.5–5.1)
Sodium: 138 mmol/L (ref 135–145)
Total Bilirubin: 1 mg/dL (ref 0.3–1.2)
Total Protein: 8 g/dL (ref 6.5–8.1)

## 2020-01-28 LAB — PROTIME-INR
INR: 0.9 (ref 0.8–1.2)
Prothrombin Time: 12.1 seconds (ref 11.4–15.2)

## 2020-01-28 LAB — GLUCOSE, CAPILLARY: Glucose-Capillary: 138 mg/dL — ABNORMAL HIGH (ref 70–99)

## 2020-01-28 MED ORDER — MIDAZOLAM HCL 2 MG/2ML IJ SOLN
INTRAMUSCULAR | Status: AC | PRN
Start: 2020-01-28 — End: 2020-01-28
  Administered 2020-01-28: 1 mg via INTRAVENOUS
  Administered 2020-01-28: 0.5 mg via INTRAVENOUS

## 2020-01-28 MED ORDER — LIDOCAINE-EPINEPHRINE (PF) 2 %-1:200000 IJ SOLN
INTRAMUSCULAR | Status: AC
  Filled 2020-01-28: qty 20

## 2020-01-28 MED ORDER — FENTANYL CITRATE (PF) 100 MCG/2ML IJ SOLN
INTRAMUSCULAR | Status: AC
  Filled 2020-01-28: qty 2

## 2020-01-28 MED ORDER — LIDOCAINE HCL 1 % IJ SOLN
INTRAMUSCULAR | Status: AC
  Filled 2020-01-28: qty 20

## 2020-01-28 MED ORDER — MIDAZOLAM HCL 2 MG/2ML IJ SOLN
INTRAMUSCULAR | Status: AC
  Filled 2020-01-28: qty 2

## 2020-01-28 MED ORDER — SODIUM CHLORIDE 0.9 % IV SOLN: INTRAVENOUS | Status: DC

## 2020-01-28 MED ORDER — FENTANYL CITRATE (PF) 100 MCG/2ML IJ SOLN
INTRAMUSCULAR | Status: AC | PRN
  Administered 2020-01-28: 50 ug via INTRAVENOUS
  Administered 2020-01-28: 25 ug via INTRAVENOUS

## 2020-01-28 MED ORDER — GELATIN ABSORBABLE 12-7 MM EX MISC
CUTANEOUS | Status: AC
  Filled 2020-01-28: qty 1

## 2020-01-28 NOTE — Progress Notes (Incomplete)
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    Pancreatic cancer ([place])  01/13/2020 Imaging   US Abdomen 01/13/20  IMPRESSION: 1. Suggestion of 3 vascular hypoechoic mass within the liver measuring up to 2.5 cm. 2. Suggestion of a hypoechoic mass within the tail of the pancreas that is not well visualized. 3. Findings concerning for malignancy, please see separately dictated CT abdomen pelvis 01/13/2020.   01/13/2020 Imaging   CT AP 01/13/20  IMPRESSION: 1. Hypoenhancing 2.0 cm in long axis mass in the pancreatic tail compatible with pancreatic adenocarcinoma. The mass abuts the posterior gastric wall but without definite gastric invasion. The splenic vein appears narrowed/attenuated compared to prior exams and the lesion abuts the margin of the splenic artery. 2. Hypoenhancing hepatic masses are new compared to 11/22/2017 and are highly suspicious for metastatic lesions. 3. Questionable 0.8 by 0.8 cm omental tumor nodule versus dense diverticulum above the transverse colon. 4. Other imaging findings of potential clinical significance: Mild distal esophageal wall thickening, query esophagitis. Stable left adrenal adenoma. Sigmoid colon diverticulosis. Lumbar and thoracic spondylosis and degenerative disc disease contributing to multilevel impingement. 5. Aortic atherosclerosis. Aortic Atherosclerosis (ICD10-I70.0).   01/21/2020 Initial Diagnosis   Pancreatic cancer [facility])       CURRENT THERAPY:  ***  INTERVAL HISTORY: *** Mariah Henderson is here for a follow up.     REVIEW OF SYSTEMS:  *** Constitutional: Denies fevers, chills or abnormal weight loss Eyes: Denies blurriness of vision Ears, nose, mouth, throat, and face: Denies mucositis or sore throat Respiratory: Denies cough, dyspnea or wheezes Cardiovascular: Denies palpitation, chest discomfort or lower extremity swelling Gastrointestinal:  Denies nausea, heartburn or change in bowel habits Skin: Denies abnormal skin rashes Lymphatics: Denies new lymphadenopathy or easy bruising Neurological:Denies numbness, tingling or new weaknesses Behavioral/Psych: Mood is stable, no new changes  All other systems were reviewed with the patient and are negative.  MEDICAL HISTORY:  Past Medical History:  Diagnosis Date  . Acute medial meniscus tear    right knee  . Anxiety   . Asthma   . Cervical spondylosis   . Chronic serous otitis media   . Constipation   . COPD (chronic obstructive pulmonary disease) ([place])   . Depression    followed by [name]; no longer of depakote, seroquel  . Diabetes mellitus without complication ([place])   . Dysuria 12/27/2009   Qualifier: Diagnosis of  By: [name] MD, Amanjot    . Foot drop   . GERD (gastroesophageal reflux disease)   . Heart murmur   . Hyperlipidemia   . Hypertension   . Low back pain   . Obesity   . Paraumbilical hernia   . Pre-diabetes     SURGICAL HISTORY: Past Surgical History:  Procedure Laterality Date  . ABDOMINAL HYSTERECTOMY    . ANTERIOR CERVICAL DECOMP/DISCECTOMY FUSION N/A 10/05/2014   Procedure: ACDF - C5-C6 - C6-C7;  Surgeon: Karie Chimera, MD;  Location: [facility];  Service: Neurosurgery;  Laterality: N/A;  ACDF - C5-C6 - C6-C7  .  CHOLECYSTECTOMY    . COLONOSCOPY    . FOOT SURGERY    . HERNIA REPAIR  1999  . KNEE ARTHROSCOPY WITH MEDIAL MENISECTOMY Right 06/14/2016   Procedure: RIGHT KNEE ARTHROSCOPY WITH PARTIAL MEDIAL  MENISCECTOMY, SUBCHONDROPLASTY;  Surgeon: Leandrew Koyanagi, MD;  Location: [facility];  Service: Orthopedics;  Laterality: Right;  . KNEE ARTHROSCOPY WITH SUBCHONDROPLASTY Right 06/14/2016   Procedure: KNEE ARTHROSCOPY WITH SUBCHONDROPLASTY;  Surgeon: Leandrew Koyanagi, MD;  Location: [facility];  Service: Orthopedics;  Laterality: Right;  . RADIOACTIVE SEED GUIDED EXCISIONAL BREAST BIOPSY Left 06/23/2014   Procedure: RADIOACTIVE SEED GUIDED EXCISIONAL BREAST BIOPSY;  Surgeon: Stark Klein, MD;  Location: [facility];  Service: General;  Laterality: Left;    I have reviewed the social history and family history with the patient and they are unchanged from previous note.  ALLERGIES:  has No Known Allergies.  MEDICATIONS:  Current Outpatient Medications  Medication Sig Dispense Refill  . albuterol (PROVENTIL) (2.5 MG/3ML) 0.083% nebulizer solution Take 3 mLs (2.5 mg total) by nebulization every 6 (six) hours as needed for wheezing. 1080 mL 3  . albuterol (VENTOLIN HFA) 108 (90 Base) MCG/ACT inhaler INHALE 1 TO 2 PUFFS INTO THE LUNGS EVERY 6 HOURS AS NEEDED FOR WHEEZING OR SHORTNESS OF BREATH 54 g 1  . alendronate (FOSAMAX) 70 MG tablet TAKE 1 TABLET BY MOUTH EVERY 7 DAYS. TAKE WITH A FULL GLASS OF WATER ON AN EMPTY STOMACH. 12 tablet 3  . atorvastatin (LIPITOR) 40 MG tablet Take 1 tablet (40 mg total) by mouth daily. 90 tablet 3  . benazepril (LOTENSIN) 20 MG tablet TAKE 1 TABLET(20 MG) BY MOUTH DAILY 90 tablet 3  . calcium carbonate (OS-CAL) 600 MG TABS tablet Take by mouth.    . celecoxib (CELEBREX) 100 MG capsule TAKE 1 CAPSULE(100 MG) BY MOUTH TWICE DAILY 90 capsule 1  . cholecalciferol (VITAMIN D) 1000 units tablet Take 2,000 Units by mouth daily.    . diclofenac Sodium (VOLTAREN) 1 % GEL Apply 2 g topically 4 (four) times daily. 100 g 2  . famotidine (PEPCID) 40 MG tablet TAKE 1/2 TABLET(20 MG) BY MOUTH TWICE DAILY 30 tablet 3  . fluticasone (FLONASE) 50  MCG/ACT nasal spray Place 1 spray into both nostrils daily. 16 g 3  . Fluticasone-Salmeterol (ADVAIR DISKUS) 100-50 MCG/DOSE AEPB Inhale 1 puff into the lungs 2 (two) times daily. 60 each 11  . furosemide (LASIX) 20 MG tablet take 1 tablet by mouth once daily 30 tablet 11  . hydrochlorothiazide (MICROZIDE) 12.5 MG capsule TAKE 1 CAPSULE(12.5 MG) BY MOUTH DAILY 90 capsule 1  . HYDROcodone-acetaminophen (NORCO/VICODIN) 5-325 MG tablet Take 1 tablet by mouth 2 (two) times daily as needed for severe pain. 30 tablet 0  . ipratropium (ATROVENT) 0.02 % nebulizer solution Take 0.5 mg by nebulization every 6 (six) hours as needed for wheezing or shortness of breath.    . latanoprost (XALATAN) 0.005 % ophthalmic solution PLACE 1 DROP INTO BOTH EYES AT BEDTIME  0  . loperamide (LOPERAMIDE A-D) 2 MG tablet Take 1 tablet (2 mg total) by mouth 2 (two) times daily as needed for diarrhea or loose stools. 6 tablet 0  . loratadine (CLARITIN) 10 MG tablet Take 1 tablet (10 mg total) by mouth daily. 30 tablet 11  . meclizine (ANTIVERT) 12.5 MG tablet TAKE 1 TABLET(12.5 MG) BY MOUTH TWICE DAILY AS NEEDED FOR DIZZINESS 15 tablet 3  . ondansetron (ZOFRAN) 4 MG tablet Take  1-2 tablets (4-8 mg total) by mouth every 8 (eight) hours as needed for nausea or vomiting. 15 tablet 0  . ONETOUCH ULTRA test strip USE TO TEST BLOOD SUGAR  ONCE DAILY 50 strip 6  . risperiDONE (RISPERDAL) 0.25 MG tablet Take 0.25 mg by mouth daily as needed.    . sitaGLIPtin (JANUVIA) 25 MG tablet Take 1 tablet (25 mg total) by mouth daily. 90 tablet 1  . traZODone (DESYREL) 50 MG tablet Take 25-50 mg by mouth at bedtime as needed for sleep.     Marland Kitchen triamcinolone cream (KENALOG) 0.1 % apply to affected area twice a day 454 g 1   No current facility-administered medications for this visit.   Facility-Administered Medications Ordered in Other Visits  Medication Dose Route Frequency Provider Last Rate Last Admin  . 0.9 %  sodium chloride infusion    Intravenous Continuous Allred, Darrell K, PA-C 50 mL/hr at 01/28/20 1201 New Bag at 01/28/20 1201    PHYSICAL EXAMINATION: ECOG PERFORMANCE STATUS: {CHL ONC ECOG PS:[id number]}  There were no vitals filed for this visit. There were no vitals filed for this visit. *** GENERAL:alert, no distress and comfortable SKIN: skin color, texture, turgor are normal, no rashes or significant lesions EYES: normal, Conjunctiva are pink and non-injected, sclera clear {OROPHARYNX:no exudate, no erythema and lips, buccal mucosa, and tongue normal}  NECK: supple, thyroid normal size, non-tender, without nodularity LYMPH:  no palpable lymphadenopathy in the cervical, axillary {or inguinal} LUNGS: clear to auscultation and percussion with normal breathing effort HEART: regular rate & rhythm and no murmurs and no lower extremity edema ABDOMEN:abdomen soft, non-tender and normal bowel sounds Musculoskeletal:no cyanosis of digits and no clubbing  NEURO: alert & oriented x 3 with fluent speech, no focal motor/sensory deficits  LABORATORY DATA:  I have reviewed the data as listed CBC Latest Ref Rng & Units 01/28/2020 11/13/2019 10/04/2018  WBC 4.0 - 10.5 K/uL 6.8 5.6 6.2  Hemoglobin 12.0 - 15.0 g/dL 13.7 12.8 13.0  Hematocrit 36.0 - 46.0 % 43.1 39.4 39.5  Platelets 150 - 400 K/uL 242 279 259     CMP Latest Ref Rng & Units 12/26/2019 11/13/2019 09/05/2019  Glucose 65 - 99 mg/dL 138(H) 92 170(H)  BUN 8 - 27 mg/dL '13 10 11  ' Creatinine 0.57 - 1.00 mg/dL 0.78 0.74 0.80  Sodium 134 - 144 mmol/L 139 141 141  Potassium 3.5 - 5.2 mmol/L 4.4 4.2 4.6  Chloride 96 - 106 mmol/L 96 101 101  CO2 20 - 29 mmol/L '29 28 27  ' Calcium 8.7 - 10.3 mg/dL 9.8 9.6 9.7  Total Protein 6.0 - 8.5 g/dL 6.7 6.4 -  Total Bilirubin 0.0 - 1.2 mg/dL 0.3 0.3 -  Alkaline Phos 44 - 121 IU/L 210(H) 122(H) -  AST 0 - 40 IU/L 94(H) 30 -  ALT 0 - 32 IU/L 86(H) 25 -      RADIOGRAPHIC STUDIES: I have personally reviewed the radiological  images as listed and agreed with the findings in the report. No results found.   ASSESSMENT & PLAN:  Mariah Henderson is a 71 y.o. female with   1. Pancreatic and Liver masses concerning for metastatic pancreatic cancer  -I discussed and personally reviewed her image findings with patient and her sister in great detail. Her CT AP from 01/13/20 showed 2cm mass on tail of pancreas. The mass abuts the posterior gastric wall but without definite gastric invasion. Scan also shows multiple (at least 5) hepatic masses highly suspicious for metastatic  lesions and Questionable 0.8 by 0.8 cm omental tumor nodule versus dense diverticulum above the transverse colon. -This presentation is highly suspicious of pancreatic adenocarcinoma with liver metastasis. For definitive diagnosis, I recommend liver biopsy. I also recommend CT chest to evaluate for distant metastasis and complete staging. She is agreeable.  -I discussed if her liver biopsy confirms pancreatic metastasis, her cancer would be stage IV and no longer curable or eligible for surgery. I discussed this is still treatable, mainly with systemic chemotherapy if this is adenocarcinoma.  -I will obtain genomic testing such as FO on her biopsy sample and genetic testing to see if she is eligible for target or immunotherapy. She is agreeable.  -I discussed option of participating in [employer] study. She is interested.  -F/u 2 days after biopsy    2. Abdominal Pain, low appetite, weight loss, Transmanitis  -She has had abdominal pain for the past month, 5/10. She was prescribed hydrocodone but did not pick up. She is fine to use OTC pain medication.  -She also has had sudden loss of appetite and weight loss since October 2021. She has overall lost 10 pounds.  -Her blood work 2 months ago was normal, but LFTs on 01/05/20 labs were abnormal with Alk Phos 210, AST 94, ALT 86, normal Tbili.    3. Comorbidities: Asthma, Borderline Diabetic, GERD, Heart  murmur, HLD, HTN, arthritis  -On medications, including multiple diuretics.  -Managed by PCP.   4. Social support -She lives alone in 1 level house and she is retired. She has 1 adult son who lives in [place]. She has a twin and 3 other sisters who live in [place], and other siblings and relative in [place].  -She notes having faith and maintaining positive outlook.    PLAN:  -CT Chest in 1-2 weeks  -IR Liver biopsy in 1-2 weeks -F/u 2 days after biopsy  -genetic referral   No problem-specific Assessment & Plan notes found for this encounter.   No orders of the defined types were placed in this encounter.  All questions were answered. The patient knows to call the clinic with any problems, questions or concerns. No barriers to learning was detected. The total time spent in the appointment was {CHL ONC TIME VISIT - CHYIF:[id number]}.     Joslyn Devon 01/28/2020   Oneal Deputy, am acting as scribe for Truitt Merle, MD.   {Add scribe attestation statement}

## 2020-01-28 NOTE — Discharge Instructions (Signed)
Liver Biopsy, Care After These instructions give you information on caring for yourself after your procedure. Your doctor may also give you more specific instructions. Call your doctor if you have any problems or questions after your procedure. What can I expect after the procedure? After the procedure, it is common to have:  Pain and soreness where the biopsy was done.  Bruising around the area where the biopsy was done.  Sleepiness and be tired for a few days. Follow these instructions at home: Medicines  Take over-the-counter and prescription medicines only as told by your doctor.  If you were prescribed an antibiotic medicine, take it as told by your doctor. Do not stop taking the antibiotic even if you start to feel better.  Do not take medicines such as aspirin and ibuprofen. These medicines can thin your blood. Do not take these medicines unless your doctor tells you to take them.  If you are taking prescription pain medicine, take actions to prevent or treat constipation. Your doctor may recommend that you: ? Drink enough fluid to keep your pee (urine) clear or pale yellow. ? Take over-the-counter or prescription medicines. ? Eat foods that are high in fiber, such as fresh fruits and vegetables, whole grains, and beans. ? Limit foods that are high in fat and processed sugars, such as fried and sweet foods. Caring for your cut  Follow instructions from your doctor about how to take care of your cuts from surgery (incisions). Make sure you: ? Wash your hands with soap and water before you change your bandage (dressing). If you cannot use soap and water, use hand sanitizer. ? Change your bandage as told by your doctor. ? Leave stitches (sutures), skin glue, or skin tape (adhesive) strips in place. They may need to stay in place for 2 weeks or longer. If tape strips get loose and curl up, you may trim the loose edges. Do not remove tape strips completely unless your doctor says it is  okay.  Check your cuts every day for signs of infection. Check for: ? Redness, swelling, or more pain. ? Fluid or blood. ? Pus or a bad smell. ? Warmth.  Do not take baths, swim, or use a hot tub until your doctor says it is okay to do so. Activity  Rest at home for 1-2 days or as told by your doctor. ? Avoid sitting for a long time without moving. Get up to take short walks every 1-2 hours.  Return to your normal activities as told by your doctor. Ask what activities are safe for you.  Do not do these things in the first 24 hours: ? Drive. ? Use machinery. ? Take a bath or shower.  Do not lift more than 10 pounds (4.5 kg) or play contact sports for the first 2 weeks.   General instructions  Do not drink alcohol in the first week after the procedure.  Have someone stay with you for at least 24 hours after the procedure.  Get your test results. Ask your doctor or the department that is doing the test: ? When will my results be ready? ? How will I get my results? ? What are my treatment options? ? What other tests do I need? ? What are my next steps?  Keep all follow-up visits as told by your doctor. This is important.   Contact a doctor if:  A cut bleeds and leaves more than just a small spot of blood.  A cut is red,   puffs up (swells), or hurts more than before.  Fluid or something else comes from a cut.  A cut smells bad.  You have a fever or chills. Get help right away if:  You have swelling, bloating, or pain in your belly (abdomen).  You get dizzy or faint.  You have a rash.  You feel sick to your stomach (nauseous) or throw up (vomit).  You have trouble breathing, feel short of breath, or feel faint.  Your chest hurts.  You have problems talking or seeing.  You have trouble with your balance or moving your arms or legs. Summary  After the procedure, it is common to have pain, soreness, bruising, and tiredness.  Your doctor will tell you how to  take care of yourself at home. Change your bandage, take your medicines, and limit your activities as told by your doctor.  Call your doctor if you have symptoms of infection. Get help right away if your belly swells, your cut bleeds a lot, or you have trouble talking or breathing. This information is not intended to replace advice given to you by your health care provider. Make sure you discuss any questions you have with your health care provider. Document Revised: 01/11/2017 Document Reviewed: 01/12/2017 Elsevier Patient Education  2021 Elsevier Inc. Moderate Conscious Sedation, Adult, Care After This sheet gives you information about how to care for yourself after your procedure. Your health care provider may also give you more specific instructions. If you have problems or questions, contact your health care provider. What can I expect after the procedure? After the procedure, it is common to have:  Sleepiness for several hours.  Impaired judgment for several hours.  Difficulty with balance.  Vomiting if you eat too soon. Follow these instructions at home: For the time period you were told by your health care provider:  Rest.  Do not participate in activities where you could fall or become injured.  Do not drive or use machinery.  Do not drink alcohol.  Do not take sleeping pills or medicines that cause drowsiness.  Do not make important decisions or sign legal documents.  Do not take care of children on your own.      Eating and drinking  Follow the diet recommended by your health care provider.  Drink enough fluid to keep your urine pale yellow.  If you vomit: ? Drink water, juice, or soup when you can drink without vomiting. ? Make sure you have little or no nausea before eating solid foods.   General instructions  Take over-the-counter and prescription medicines only as told by your health care provider.  Have a responsible adult stay with you for the time  you are told. It is important to have someone help care for you until you are awake and alert.  Do not smoke.  Keep all follow-up visits as told by your health care provider. This is important. Contact a health care provider if:  You are still sleepy or having trouble with balance after 24 hours.  You feel light-headed.  You keep feeling nauseous or you keep vomiting.  You develop a rash.  You have a fever.  You have redness or swelling around the IV site. Get help right away if:  You have trouble breathing.  You have new-onset confusion at home. Summary  After the procedure, it is common to feel sleepy, have impaired judgment, or feel nauseous if you eat too soon.  Rest after you get home. Know the things   you should not do after the procedure.  Follow the diet recommended by your health care provider and drink enough fluid to keep your urine pale yellow.  Get help right away if you have trouble breathing or new-onset confusion at home. This information is not intended to replace advice given to you by your health care provider. Make sure you discuss any questions you have with your health care provider. Document Revised: 05/02/2019 Document Reviewed: 11/28/2018 Elsevier Patient Education  2021 Elsevier Inc.   

## 2020-01-28 NOTE — Consult Note (Signed)
Chief Complaint: Patient was seen in consultation today for image guided liver mass biopsy  Referring Physician(s): Feng,Yan  Supervising Physician: Sandi Mariscal  Patient Status: [facility] - Out-pt  History of Present Illness: Mariah Henderson is a 70 y.o. female with past medical history of anxiety, asthma, COPD, depression, diabetes, GERD, hyperlipidemia, hypertension who presents now with abdominal pain, weight loss, and recent CT abd/pelvis revealing:  1. Hypoenhancing 2.0 cm in long axis mass in the pancreatic tail compatible with pancreatic adenocarcinoma. The mass abuts the posterior gastric wall but without definite gastric invasion. The splenic vein appears narrowed/attenuated compared to prior exams and the lesion abuts the margin of the splenic artery. 2. Hypoenhancing hepatic masses are new compared to 11/22/2017 and are highly suspicious for metastatic lesions. 3. Questionable 0.8 by 0.8 cm omental tumor nodule versus dense diverticulum above the transverse colon. 4. Other imaging findings of potential clinical significance: Mild distal esophageal wall thickening, query esophagitis. Stable left adrenal adenoma. Sigmoid colon diverticulosis. Lumbar and thoracic spondylosis and degenerative disc disease contributing to multilevel impingement. 5. Aortic atherosclerosis.  She is scheduled today for image guided liver mass biopsy for further evaluation.  CA 19-9 is normal.  Past Medical History:  Diagnosis Date  . Acute medial meniscus tear    right knee  . Anxiety   . Asthma   . Cervical spondylosis   . Chronic serous otitis media   . Constipation   . COPD (chronic obstructive pulmonary disease) ([place])   . Depression    followed by [name]; no longer of depakote, seroquel  . Diabetes mellitus without complication ([place])   . Dysuria 12/27/2009   Qualifier: Diagnosis of  By: [name] MD, Amanjot    . Foot drop   . GERD (gastroesophageal reflux disease)   . Heart  murmur   . Hyperlipidemia   . Hypertension   . Low back pain   . Obesity   . Paraumbilical hernia   . Pre-diabetes     Past Surgical History:  Procedure Laterality Date  . ABDOMINAL HYSTERECTOMY    . ANTERIOR CERVICAL DECOMP/DISCECTOMY FUSION N/A 10/05/2014   Procedure: ACDF - C5-C6 - C6-C7;  Surgeon: Karie Chimera, MD;  Location: [place] [facility];  Service: Neurosurgery;  Laterality: N/A;  ACDF - C5-C6 - C6-C7  . CHOLECYSTECTOMY    . COLONOSCOPY    . FOOT SURGERY    . HERNIA REPAIR  1999  . KNEE ARTHROSCOPY WITH MEDIAL MENISECTOMY Right 06/14/2016   Procedure: RIGHT KNEE ARTHROSCOPY WITH PARTIAL MEDIAL MENISCECTOMY, SUBCHONDROPLASTY;  Surgeon: Leandrew Koyanagi, MD;  Location: [facility];  Service: Orthopedics;  Laterality: Right;  . KNEE ARTHROSCOPY WITH SUBCHONDROPLASTY Right 06/14/2016   Procedure: KNEE ARTHROSCOPY WITH SUBCHONDROPLASTY;  Surgeon: Leandrew Koyanagi, MD;  Location: [place];  Service: Orthopedics;  Laterality: Right;  . RADIOACTIVE SEED GUIDED EXCISIONAL BREAST BIOPSY Left 06/23/2014   Procedure: RADIOACTIVE SEED GUIDED EXCISIONAL BREAST BIOPSY;  Surgeon: Stark Klein, MD;  Location: [facility];  Service: General;  Laterality: Left;    Allergies: Patient has no known allergies.  Medications: Prior to Admission medications   Medication Sig Start Date End Date Taking? Authorizing Provider  albuterol (PROVENTIL) (2.5 MG/3ML) 0.083% nebulizer solution Take 3 mLs (2.5 mg total) by nebulization every 6 (six) hours as needed for wheezing. 02/22/18   Velna Ochs, MD  albuterol (VENTOLIN HFA) 108 (90 Base) MCG/ACT inhaler INHALE 1 TO 2 PUFFS INTO THE LUNGS EVERY 6 HOURS AS NEEDED  FOR WHEEZING OR SHORTNESS OF BREATH 03/11/19   Sid Falcon, MD  alendronate (FOSAMAX) 70 MG tablet TAKE 1 TABLET BY MOUTH EVERY 7 DAYS. TAKE WITH A FULL GLASS OF WATER ON AN EMPTY STOMACH. 01/14/20   Sid Falcon, MD  atorvastatin (LIPITOR) 40 MG tablet  Take 1 tablet (40 mg total) by mouth daily. 01/14/20   Sid Falcon, MD  benazepril (LOTENSIN) 20 MG tablet TAKE 1 TABLET(20 MG) BY MOUTH DAILY 01/14/20   Gilles Chiquito B, MD  calcium carbonate (OS-CAL) 600 MG TABS tablet Take by mouth.    [provider]  celecoxib (CELEBREX) 100 MG capsule TAKE 1 CAPSULE(100 MG) BY MOUTH TWICE DAILY 01/14/20   Sid Falcon, MD  cholecalciferol (VITAMIN D) 1000 units tablet Take 2,000 Units by mouth daily.    [provider]  diclofenac Sodium (VOLTAREN) 1 % GEL Apply 2 g topically 4 (four) times daily. 09/05/19   Sid Falcon, MD  famotidine (PEPCID) 40 MG tablet TAKE 1/2 TABLET(20 MG) BY MOUTH TWICE DAILY 01/14/20   Sid Falcon, MD  fluticasone [facility]) 50 MCG/ACT nasal spray Place 1 spray into both nostrils daily. 01/14/20   Sid Falcon, MD  Fluticasone-Salmeterol (ADVAIR DISKUS) 100-50 MCG/DOSE AEPB Inhale 1 puff into the lungs 2 (two) times daily. 01/14/20 01/13/21  Sid Falcon, MD  furosemide (LASIX) 20 MG tablet take 1 tablet by mouth once daily 12/27/15   Bartholomew Crews, MD  hydrochlorothiazide (MICROZIDE) 12.5 MG capsule TAKE 1 CAPSULE(12.5 MG) BY MOUTH DAILY 01/14/20   Sid Falcon, MD  HYDROcodone-acetaminophen (NORCO/VICODIN) 5-325 MG tablet Take 1 tablet by mouth 2 (two) times daily as needed for severe pain. 01/14/20   Sid Falcon, MD  ipratropium (ATROVENT) 0.02 % nebulizer solution Take 0.5 mg by nebulization every 6 (six) hours as needed for wheezing or shortness of breath.    [provider]  latanoprost (XALATAN) 0.005 % ophthalmic solution PLACE 1 DROP INTO BOTH EYES AT BEDTIME 09/28/17   [provider]  loperamide (LOPERAMIDE A-D) 2 MG tablet Take 1 tablet (2 mg total) by mouth 2 (two) times daily as needed for diarrhea or loose stools. 08/31/17   Santos-Sanchez, Merlene Morse, MD  loratadine (CLARITIN) 10 MG tablet Take 1 tablet (10 mg total) by mouth daily. 01/14/20   Sid Falcon, MD  meclizine (ANTIVERT) 12.5 MG tablet TAKE 1 TABLET(12.5 MG) BY MOUTH TWICE DAILY AS NEEDED FOR DIZZINESS 11/13/19   Iona Beard, MD  ondansetron [facility]) 4 MG tablet Take 1-2 tablets (4-8 mg total) by mouth every 8 (eight) hours as needed for nausea or vomiting. 08/31/17   Welford Roche, MD  [facility] ULTRA test strip USE TO TEST BLOOD SUGAR  ONCE DAILY 01/21/20   Sid Falcon, MD  risperiDONE (RISPERDAL) 0.25 MG tablet Take 0.25 mg by mouth daily as needed. 08/08/17   [provider]  sitaGLIPtin (JANUVIA) 25 MG tablet Take 1 tablet (25 mg total) by mouth daily. 01/14/20   Sid Falcon, MD  traZODone (DESYREL) 50 MG tablet Take 25-50 mg by mouth at bedtime as needed for sleep.     [provider]  triamcinolone cream (KENALOG) 0.1 % apply to affected area twice a day 01/22/17   Sid Falcon, MD     Family History  Problem Relation Age of Onset  . Cancer Sister 22       breast cancer   . Cancer Nephew 66  colon cancer  . Colon cancer Neg Hx     Social History   Socioeconomic History  . Marital status: Divorced    Spouse name: Not on file  . Number of children: 1  . Years of education: Not on file  . Highest education level: Not on file  Occupational History  . Occupation: retired   Tobacco Use  . Smoking status: Former Smoker    Packs/day: 0.50    Years: 20.00    Pack years: 10.00    Types: Cigarettes    Quit date: 01/17/1996    Years since quitting: 24.0  . Smokeless tobacco: Never Used  Substance and Sexual Activity  . Alcohol use: No    Alcohol/week: 0.0 standard drinks  . Drug use: No  . Sexual activity: Not Currently  Other Topics Concern  . Not on file  Social History Narrative  . Not on file   Social Determinants of Health   Financial Resource Strain: Medium Risk  . Difficulty of Paying Living Expenses: Somewhat hard  Food Insecurity: No Food Insecurity  . Worried About Charity fundraiser in the Last Year: Never  true  . Ran Out of Food in the Last Year: Never true  Transportation Needs: No Transportation Needs  . Lack of Transportation (Medical): No  . Lack of Transportation (Non-Medical): No  Physical Activity: Not on file  Stress: Stress Concern Present  . Feeling of Stress : Rather much  Social Connections: Moderately Integrated  . Frequency of Communication with Friends and Family: More than three times a week  . Frequency of Social Gatherings with Friends and Family: More than three times a week  . Attends Religious Services: More than 4 times per year  . Active Member of Clubs or Organizations: Yes  . Attends Archivist Meetings: More than 4 times per year  . Marital Status: Widowed      Review of Systems see above; denies fever, headache, chest pain, dyspnea, cough, back pain, nausea, vomiting or bleeding  Vital Signs:pending  LMP 04/29/1966 (LMP Unknown)   Physical Exam awake, alert.  Chest clear to auscultation bilaterally.  Heart with regular rate and rhythm.  Abdomen soft, positive bowel sounds, tender epigastric region to palpation.  No lower extremity edema.  Imaging: US Abdomen Complete  Result Date: 01/13/2020 CLINICAL DATA:  abdominal pain, abnormal liver enzymes, epigastric pain, weight loss since 9/21 EXAM: ABDOMEN ULTRASOUND COMPLETE COMPARISON:  CT abdomen pelvis 01/13/2020. FINDINGS: Gallbladder: Status post cholecystectomy. Common bile duct: Diameter: 9 mm Liver: There is a 2.5 x 2 x 2.5 cm solid hypoechoic area demonstrating internal vascular flow. There are a couple of other similar lesions measuring 1.8 x 1.2 x 1.5 cm within the right and 0.9 x 1 x 0.9 cm within left hepatic lobes. Heterogeneous parenchymal echogenicity. Portal vein is patent on color Doppler imaging with normal direction of blood flow towards the liver. IVC: No abnormality visualized. Pancreas: Suggestion of a hypoechoic mass within the tail of the pancreas that is not well visualized.  Spleen: Size and appearance within normal limits. Right Kidney: Length: 10 cm. Echogenicity within normal limits. No mass or hydronephrosis visualized. Left Kidney: Length: 8.9 cm. Echogenicity within normal limits. No mass or hydronephrosis visualized. Abdominal aorta: No aneurysm visualized.  Atherosclerosis. Other findings: None. IMPRESSION: 1. Suggestion of 3 vascular hypoechoic mass within the liver measuring up to 2.5 cm. 2. Suggestion of a hypoechoic mass within the tail of the pancreas that is not well visualized.  3. Findings concerning for malignancy, please see separately dictated CT abdomen pelvis 01/13/2020. These results will be called to the ordering clinician or representative by the Radiologist Assistant, and communication documented in the PACS or [employer]. Electronically Signed   By: Iven Finn M.D.   On: 01/13/2020 23:50   CT Abdomen Pelvis W Contrast  Result Date: 01/14/2020 CLINICAL DATA:  Epigastric pain and weight loss EXAM: CT ABDOMEN AND PELVIS WITH CONTRAST TECHNIQUE: Multidetector CT imaging of the abdomen and pelvis was performed using the standard protocol following bolus administration of intravenous contrast. CONTRAST:  152mL OMNIPAQUE IOHEXOL 300 MG/ML  SOLN COMPARISON:  Multiple exams, including ultrasound of 01/13/2020 as well as overlapping portions of CT chest from 04/10/2017 and 11/22/2017, and CT abdomen from 09/24/2005 FINDINGS: Lower chest: Stable scarring in both lower lobes. Mild distal esophageal wall thickening. Hepatobiliary: Hypoenhancing hepatic masses are new compared to 11/22/2017 and are scattered throughout the liver. Index lesion in the right hepatic lobe 2.1 by 2.1 cm on image 14 of series 3. These masses demonstrate internal vascularity on recent ultrasound and are highly concerning for metastatic lesions. Cholecystectomy noted. Common bile duct measures up to 0.7 cm in diameter, borderline prominent but probably a physiologic response to  cholecystectomy. Pancreas: A new mass is present in the pancreatic tail near the junction of the pancreatic body and tail. --- Pancreatic lesion characteristics: Size: 2.0 by 1.8 by 1.7 cm Location: Pancreatic tail Characterization: Solid Enhancement: Hypoenhancing Other Characteristics: Associated dorsal pancreatic duct dilatation distal to the mass, as well as pancreatic atrophy distal to the mass. Local extent of mass: The mass is tangential to the posterior gastric wall but without definite gastric invasion. Vascular Involvement: The mass abuts the splenic vein which appears narrowed/attenuated compared to prior exams. The upper portion of the mass abuts the splenic artery. Variant hepatic artery anatomy: Conventional Bile Duct Involvement: Absent Variant biliary anatomy: Not observed Adjacent Nodes: Absent Omental/Peritoneal Disease: 0.8 by 0.8 cm tumor nodule versus dense diverticulum above the transverse colon on image 44 of series 3. Distant Metastases: Liver --- Spleen: Unremarkable Adrenals/Urinary Tract: 3.4 by 2.0 cm left adrenal mass, stable from 09/24/2005, previously shown to be adenoma based on density. Stomach/Bowel: Sigmoid colon diverticulosis. Vascular/Lymphatic: Aortoiliac atherosclerotic vascular disease. Gastric venous collateral vessels. Reproductive: Uterus absent. Adnexa unremarkable. Other: Rim calcifications along the inferior margin of the liver are unchanged from 09/24/2005 and are likely either spilled gallstones or postinflammatory. Musculoskeletal: Lumbar and thoracic spondylosis and degenerative disc disease contributing to multilevel impingement. IMPRESSION: 1. Hypoenhancing 2.0 cm in long axis mass in the pancreatic tail compatible with pancreatic adenocarcinoma. The mass abuts the posterior gastric wall but without definite gastric invasion. The splenic vein appears narrowed/attenuated compared to prior exams and the lesion abuts the margin of the splenic artery. 2. Hypoenhancing  hepatic masses are new compared to 11/22/2017 and are highly suspicious for metastatic lesions. 3. Questionable 0.8 by 0.8 cm omental tumor nodule versus dense diverticulum above the transverse colon. 4. Other imaging findings of potential clinical significance: Mild distal esophageal wall thickening, query esophagitis. Stable left adrenal adenoma. Sigmoid colon diverticulosis. Lumbar and thoracic spondylosis and degenerative disc disease contributing to multilevel impingement. 5. Aortic atherosclerosis. Aortic Atherosclerosis (ICD10-I70.0). Electronically Signed   By: Van Clines M.D.   On: 01/14/2020 08:59    Labs:  CBC: Recent Labs    11/13/19 1617  WBC 5.6  HGB 12.8  HCT 39.4  PLT 279    COAGS: No results for input(s):  INR, APTT in the last 8760 hours.  BMP: Recent Labs    09/05/19 1041 11/13/19 1617 12/26/19 1124  NA 141 141 139  K 4.6 4.2 4.4  CL 101 101 96  CO2 27 28 29   GLUCOSE 170* 92 138*  BUN 11 10 13   CALCIUM 9.7 9.6 9.8  CREATININE 0.80 0.74 0.78  GFRNONAA 74 82 77  GFRAA 86 94 88    LIVER FUNCTION TESTS: Recent Labs    11/13/19 1617 12/26/19 1124  BILITOT 0.3 0.3  AST 30 94*  ALT 25 86*  ALKPHOS 122* 210*  PROT 6.4 6.7  ALBUMIN 4.0 4.0    TUMOR MARKERS: No results for input(s): AFPTM, CEA, CA199, CHROMGRNA in the last 8760 hours.  Assessment and Plan: 70 y.o. female with past medical history of anxiety, asthma, COPD, depression, diabetes, GERD, hyperlipidemia, hypertension who presents now with abdominal pain, weight loss, and recent CT abd/pelvis revealing:  1. Hypoenhancing 2.0 cm in long axis mass in the pancreatic tail compatible with pancreatic adenocarcinoma. The mass abuts the posterior gastric wall but without definite gastric invasion. The splenic vein appears narrowed/attenuated compared to prior exams and the lesion abuts the margin of the splenic artery. 2. Hypoenhancing hepatic masses are new compared to 11/22/2017 and are  highly suspicious for metastatic lesions. 3. Questionable 0.8 by 0.8 cm omental tumor nodule versus dense diverticulum above the transverse colon. 4. Other imaging findings of potential clinical significance: Mild distal esophageal wall thickening, query esophagitis. Stable left adrenal adenoma. Sigmoid colon diverticulosis. Lumbar and thoracic spondylosis and degenerative disc disease contributing to multilevel impingement. 5. Aortic atherosclerosis.  She is scheduled today for image guided liver mass biopsy for further evaluation.  CA 19-9 is normal.Risks and benefits of procedure was discussed with the patient  including, but not limited to bleeding, infection, damage to adjacent structures or low yield requiring additional tests.  All of the questions were answered and there is agreement to proceed.  Consent signed and in chart.     Thank you for this interesting consult.  I greatly enjoyed meeting Mariah Henderson and look forward to participating in their care.  A copy of this report was sent to the requesting provider on this date.  Electronically Signed: D. Rowe Robert, PA-C 01/28/2020, 11:40 AM   I spent a total of 25 minutes in face to face in clinical consultation, greater than 50% of which was counseling/coordinating care for image guided liver lesion biopsy

## 2020-01-28 NOTE — Discharge Instructions (Signed)
Please call Interventional Radiology clinic [phone] with any questions or concerns.  You may remove your dressing and shower tomorrow.    Liver Biopsy, Care After These instructions give you information on caring for yourself after your procedure. Your doctor may also give you more specific instructions. Call your doctor if you have any problems or questions after your procedure. What can I expect after the procedure? After the procedure, it is common to have:  Pain and soreness where the biopsy was done.  Bruising around the area where the biopsy was done.  Sleepiness and be tired for a few days. Follow these instructions at home: Medicines  Take over-the-counter and prescription medicines only as told by your doctor.  If you were prescribed an antibiotic medicine, take it as told by your doctor. Do not stop taking the antibiotic even if you start to feel better.  Do not take medicines such as aspirin and ibuprofen. These medicines can thin your blood. Do not take these medicines unless your doctor tells you to take them.  If you are taking prescription pain medicine, take actions to prevent or treat constipation. Your doctor may recommend that you: ? Drink enough fluid to keep your pee (urine) clear or pale yellow. ? Take over-the-counter or prescription medicines. ? Eat foods that are high in fiber, such as fresh fruits and vegetables, whole grains, and beans. ? Limit foods that are high in fat and processed sugars, such as fried and sweet foods. Caring for your cut  Follow instructions from your doctor about how to take care of your cuts from surgery (incisions). Make sure you: ? Wash your hands with soap and water before you change your bandage (dressing). If you cannot use soap and water, use hand sanitizer. ? Change your bandage as told by your doctor. ? Leave stitches (sutures), skin glue, or skin tape (adhesive) strips in place. They may need to stay in place for 2  weeks or longer. If tape strips get loose and curl up, you may trim the loose edges. Do not remove tape strips completely unless your doctor says it is okay.  Check your cuts every day for signs of infection. Check for: ? Redness, swelling, or more pain. ? Fluid or blood. ? Pus or a bad smell. ? Warmth.  Do not take baths, swim, or use a hot tub until your doctor says it is okay to do so. Activity  Rest at home for 1-2 days or as told by your doctor. ? Avoid sitting for a long time without moving. Get up to take short walks every 1-2 hours.  Return to your normal activities as told by your doctor. Ask what activities are safe for you.  Do not do these things in the first 24 hours: ? Drive. ? Use machinery. ? Take a bath or shower.  Do not lift more than 10 pounds (4.5 kg) or play contact sports for the first 2 weeks.   General instructions  Do not drink alcohol in the first week after the procedure.  Have someone stay with you for at least 24 hours after the procedure.  Get your test results. Ask your doctor or the department that is doing the test: ? When will my results be ready? ? How will I get my results? ? What are my treatment options? ? What other tests do I need? ? What are my next steps?  Keep all follow-up visits as told by your doctor. This is important.  Contact a doctor if:  A cut bleeds and leaves more than just a small spot of blood.  A cut is red, puffs up (swells), or hurts more than before.  Fluid or something else comes from a cut.  A cut smells bad.  You have a fever or chills. Get help right away if:  You have swelling, bloating, or pain in your belly (abdomen).  You get dizzy or faint.  You have a rash.  You feel sick to your stomach (nauseous) or throw up (vomit).  You have trouble breathing, feel short of breath, or feel faint.  Your chest hurts.  You have problems talking or seeing.  You have trouble with your balance or moving  your arms or legs. Summary  After the procedure, it is common to have pain, soreness, bruising, and tiredness.  Your doctor will tell you how to take care of yourself at home. Change your bandage, take your medicines, and limit your activities as told by your doctor.  Call your doctor if you have symptoms of infection. Get help right away if your belly swells, your cut bleeds a lot, or you have trouble talking or breathing. This information is not intended to replace advice given to you by your health care provider. Make sure you discuss any questions you have with your health care provider. Document Revised: 01/11/2017 Document Reviewed: 01/12/2017 Elsevier Patient Education  2021 [place].   Moderate Conscious Sedation, Adult, Care After This sheet gives you information about how to care for yourself after your procedure. Your health care provider may also give you more specific instructions. If you have problems or questions, contact your health care provider. What can I expect after the procedure? After the procedure, it is common to have:  Sleepiness for several hours.  Impaired judgment for several hours.  Difficulty with balance.  Vomiting if you eat too soon. Follow these instructions at home: For the time period you were told by your health care provider:  Rest.  Do not participate in activities where you could fall or become injured.  Do not drive or use machinery.  Do not drink alcohol.  Do not take sleeping pills or medicines that cause drowsiness.  Do not make important decisions or sign legal documents.  Do not take care of children on your own.      Eating and drinking  Follow the diet recommended by your health care provider.  Drink enough fluid to keep your urine pale yellow.  If you vomit: ? Drink water, juice, or soup when you can drink without vomiting. ? Make sure you have little or no nausea before eating solid foods.   General  instructions  Take over-the-counter and prescription medicines only as told by your health care provider.  Have a responsible adult stay with you for the time you are told. It is important to have someone help care for you until you are awake and alert.  Do not smoke.  Keep all follow-up visits as told by your health care provider. This is important. Contact a health care provider if:  You are still sleepy or having trouble with balance after 24 hours.  You feel light-headed.  You keep feeling nauseous or you keep vomiting.  You develop a rash.  You have a fever.  You have redness or swelling around the IV site. Get help right away if:  You have trouble breathing.  You have new-onset confusion at home. Summary  After the procedure, it  is common to feel sleepy, have impaired judgment, or feel nauseous if you eat too soon.  Rest after you get home. Know the things you should not do after the procedure.  Follow the diet recommended by your health care provider and drink enough fluid to keep your urine pale yellow.  Get help right away if you have trouble breathing or new-onset confusion at home. This information is not intended to replace advice given to you by your health care provider. Make sure you discuss any questions you have with your health care provider. Document Revised: 05/02/2019 Document Reviewed: 11/28/2018 Elsevier Patient Education  2021 [place].   Moderate Conscious Sedation, Adult, Care After This sheet gives you information about how to care for yourself after your procedure. Your health care provider may also give you more specific instructions. If you have problems or questions, contact your health care provider. What can I expect after the procedure? After the procedure, it is common to have:  Sleepiness for several hours.  Impaired judgment for several hours.  Difficulty with balance.  Vomiting if you eat too soon. Follow these  instructions at home: For the time period you were told by your health care provider:  Rest.  Do not participate in activities where you could fall or become injured.  Do not drive or use machinery.  Do not drink alcohol.  Do not take sleeping pills or medicines that cause drowsiness.  Do not make important decisions or sign legal documents.  Do not take care of children on your own.      Eating and drinking  Follow the diet recommended by your health care provider.  Drink enough fluid to keep your urine pale yellow.  If you vomit: ? Drink water, juice, or soup when you can drink without vomiting. ? Make sure you have little or no nausea before eating solid foods.   General instructions  Take over-the-counter and prescription medicines only as told by your health care provider.  Have a responsible adult stay with you for the time you are told. It is important to have someone help care for you until you are awake and alert.  Do not smoke.  Keep all follow-up visits as told by your health care provider. This is important. Contact a health care provider if:  You are still sleepy or having trouble with balance after 24 hours.  You feel light-headed.  You keep feeling nauseous or you keep vomiting.  You develop a rash.  You have a fever.  You have redness or swelling around the IV site. Get help right away if:  You have trouble breathing.  You have new-onset confusion at home. Summary  After the procedure, it is common to feel sleepy, have impaired judgment, or feel nauseous if you eat too soon.  Rest after you get home. Know the things you should not do after the procedure.  Follow the diet recommended by your health care provider and drink enough fluid to keep your urine pale yellow.  Get help right away if you have trouble breathing or new-onset confusion at home. This information is not intended to replace advice given to you by your health care provider.  Make sure you discuss any questions you have with your health care provider. Document Revised: 05/02/2019 Document Reviewed: 11/28/2018 Elsevier Patient Education  2021 [employer].

## 2020-01-28 NOTE — Procedures (Signed)
Pre Procedure Dx: Concern for metastatic pancreatic cancer Post Procedural Dx: Same  Technically successful US guided biopsy of indeterminate liver lesion.  EBL: None No immediate complications.   Ronny Bacon, MD Pager #: [phone]

## 2020-01-30 ENCOUNTER — Telehealth: Payer: Self-pay | Admitting: Hematology

## 2020-01-30 ENCOUNTER — Ambulatory Visit (INDEPENDENT_AMBULATORY_CARE_PROVIDER_SITE_OTHER): Payer: Medicare Other | Admitting: Internal Medicine

## 2020-01-30 ENCOUNTER — Telehealth: Payer: Self-pay

## 2020-01-30 ENCOUNTER — Inpatient Hospital Stay: Payer: Medicare Other | Admitting: General Practice

## 2020-01-30 ENCOUNTER — Inpatient Hospital Stay: Payer: Medicare Other | Admitting: Hematology

## 2020-01-30 ENCOUNTER — Other Ambulatory Visit: Payer: Self-pay

## 2020-01-30 ENCOUNTER — Telehealth: Payer: Self-pay | Admitting: Genetic Counselor

## 2020-01-30 DIAGNOSIS — R634 Abnormal weight loss: Secondary | ICD-10-CM

## 2020-01-30 DIAGNOSIS — E1139 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: Secondary | ICD-10-CM

## 2020-01-30 LAB — SURGICAL PATHOLOGY

## 2020-01-30 MED ORDER — GLIPIZIDE ER 2.5 MG PO TB24: 2.5000 mg | ORAL_TABLET | Freq: Every day | ORAL | 2 refills | Status: DC

## 2020-01-30 NOTE — Telephone Encounter (Signed)
Pt wanted to let Dr Daryll Drown know that her son will be calling her and its okay for her to talk to him

## 2020-01-30 NOTE — Progress Notes (Signed)
  [facility] Telephone Encounter Continuity Care Appointment  HPI:   This telephone encounter was created for Mariah Henderson on 01/30/2020 for the following purpose/cc follow up of diabetes medications.  Mariah Henderson has recently been diagnosed with possible pancreatic adenocarcinoma and is following with Oncology.  She had a liver biopsy on 1/12 and results are pending.  She had expressed concern about being on Januvia given it theoretical effects on the pancreas so she has stopped this medication and would like to be on another therapy.   We discussed how her A1C is relatively well controlled (7.4) and that she may not need medication at this time.  However, she is very concerned that her blood sugars have been running high at home, 180s - 190s and she would prefer to be on a medication at this time.   As concerns her adenocarcinoma, family and the Oncology team are awaiting pathology results before deciding how to proceed.  These are still pending.    Past Medical History:  Past Medical History:  Diagnosis Date  . Acute medial meniscus tear    right knee  . Anxiety   . Asthma   . Cervical spondylosis   . Chronic serous otitis media   . Constipation   . COPD (chronic obstructive pulmonary disease) ([place])   . Depression    followed by [name]; no longer of depakote, seroquel  . Diabetes mellitus without complication ([place])   . Dysuria 12/27/2009   Qualifier: Diagnosis of  By: [name] MD, Amanjot    . Foot drop   . GERD (gastroesophageal reflux disease)   . Heart murmur   . Hyperlipidemia   . Hypertension   . Low back pain   . Obesity   . Paraumbilical hernia   . Pre-diabetes       ROS:   Decreased appetite, decreased PO intake.    Assessment / Plan / Recommendations:   Please see A&P under problem oriented charting for assessment of the patient's acute and chronic medical conditions.   As always, pt is advised that if symptoms worsen or  new symptoms arise, they should go to an urgent care facility or to to ER for further evaluation.   Consent and Medical Decision Making:   This is a telephone encounter between Mariah Henderson and Gilles Chiquito on 01/30/2020 for follow up of DM2. The visit was conducted with the patient located at home and Gilles Chiquito at [facility]. The patient's identity was confirmed using their DOB and current address. The patient has consented to being evaluated through a telephone encounter and understands the associated risks (an examination cannot be done and the patient may need to come in for an appointment) / benefits (allows the patient to remain at home, decreasing exposure to coronavirus). I personally spent 15 minutes on medical discussion.

## 2020-01-30 NOTE — Progress Notes (Signed)
I left voice message for patient regarding her cancelling her appointment today due to not feeling well after her biopsy.  I asked her to call me back on my direct line so I could assess what is going on.  Also I informed her I have rescheduled her appointment to Monday 1/17 at 4:00 pm after her CT scan.

## 2020-01-30 NOTE — Progress Notes (Incomplete)
[place]   Telephone:([phone]) [phone] [phone]   Clinic Follow up Note   Patient Care Team: Sid Falcon, MD as PCP - General (Internal Medicine) Forrest Moron, OD (Optometry) Jonnie Finner, RN as Oncology Nurse Navigator Truitt Merle, MD as Consulting Physician (Oncology) Gwyndolyn Kaufman, RN as Registered Nurse  Date of Service:  01/30/2020  CHIEF COMPLAINT: Probable pancreatic cancer with liver metastasis ***  SUMMARY OF ONCOLOGIC HISTORY: Oncology History Overview Note  Cancer Staging No matching staging information was found for the patient.    Pancreatic cancer ([place])  01/13/2020 Imaging   US Abdomen 01/13/20  IMPRESSION: 1. Suggestion of 3 vascular hypoechoic mass within the liver measuring up to 2.5 cm. 2. Suggestion of a hypoechoic mass within the tail of the pancreas that is not well visualized. 3. Findings concerning for malignancy, please see separately dictated CT abdomen pelvis 01/13/2020.   01/13/2020 Imaging   CT AP 01/13/20  IMPRESSION: 1. Hypoenhancing 2.0 cm in long axis mass in the pancreatic tail compatible with pancreatic adenocarcinoma. The mass abuts the posterior gastric wall but without definite gastric invasion. The splenic vein appears narrowed/attenuated compared to prior exams and the lesion abuts the margin of the splenic artery. 2. Hypoenhancing hepatic masses are new compared to 11/22/2017 and are highly suspicious for metastatic lesions. 3. Questionable 0.8 by 0.8 cm omental tumor nodule versus dense diverticulum above the transverse colon. 4. Other imaging findings of potential clinical significance: Mild distal esophageal wall thickening, query esophagitis. Stable left adrenal adenoma. Sigmoid colon diverticulosis. Lumbar and thoracic spondylosis and degenerative disc disease contributing to multilevel impingement. 5. Aortic atherosclerosis. Aortic Atherosclerosis (ICD10-I70.0).   01/21/2020  Initial Diagnosis   Pancreatic cancer [facility])      CURRENT THERAPY:  ***  INTERVAL HISTORY: *** [name] is here for a follow up. She presents to the clinic alone.    REVIEW OF SYSTEMS:  *** Constitutional: Denies fevers, chills or abnormal weight loss Eyes: Denies blurriness of vision Ears, nose, mouth, throat, and face: Denies mucositis or sore throat Respiratory: Denies cough, dyspnea or wheezes Cardiovascular: Denies palpitation, chest discomfort or lower extremity swelling Gastrointestinal:  Denies nausea, heartburn or change in bowel habits Skin: Denies abnormal skin rashes Lymphatics: Denies new lymphadenopathy or easy bruising Neurological:Denies numbness, tingling or new weaknesses Behavioral/Psych: Mood is stable, no new changes  All other systems were reviewed with the patient and are negative.  MEDICAL HISTORY:  Past Medical History:  Diagnosis Date  . Acute medial meniscus tear    right knee  . Anxiety   . Asthma   . Cervical spondylosis   . Chronic serous otitis media   . Constipation   . COPD (chronic obstructive pulmonary disease) ([place])   . Depression    followed by [name]; no longer of depakote, seroquel  . Diabetes mellitus without complication ([place])   . Dysuria 12/27/2009   Qualifier: Diagnosis of  By: [name] MD, Amanjot    . Foot drop   . GERD (gastroesophageal reflux disease)   . Heart murmur   . Hyperlipidemia   . Hypertension   . Low back pain   . Obesity   . Paraumbilical hernia   . Pre-diabetes     SURGICAL HISTORY: Past Surgical History:  Procedure Laterality Date  . ABDOMINAL HYSTERECTOMY    . ANTERIOR CERVICAL DECOMP/DISCECTOMY FUSION N/A 10/05/2014   Procedure: ACDF - C5-C6 - C6-C7;  Surgeon: Karie Chimera, MD;  Location: [place] [facility];  Service: Neurosurgery;  Laterality: N/A;  ACDF - C5-C6 - C6-C7  . CHOLECYSTECTOMY    . COLONOSCOPY    . FOOT SURGERY    . HERNIA REPAIR  1999  . IR US GUIDANCE  01/28/2020  . KNEE  ARTHROSCOPY WITH MEDIAL MENISECTOMY Right 06/14/2016   Procedure: RIGHT KNEE ARTHROSCOPY WITH PARTIAL MEDIAL MENISCECTOMY, SUBCHONDROPLASTY;  Surgeon: Leandrew Koyanagi, MD;  Location: [place];  Service: Orthopedics;  Laterality: Right;  . KNEE ARTHROSCOPY WITH SUBCHONDROPLASTY Right 06/14/2016   Procedure: KNEE ARTHROSCOPY WITH SUBCHONDROPLASTY;  Surgeon: Leandrew Koyanagi, MD;  Location: [place];  Service: Orthopedics;  Laterality: Right;  . RADIOACTIVE SEED GUIDED EXCISIONAL BREAST BIOPSY Left 06/23/2014   Procedure: RADIOACTIVE SEED GUIDED EXCISIONAL BREAST BIOPSY;  Surgeon: Stark Klein, MD;  Location: [place];  Service: General;  Laterality: Left;    I have reviewed the social history and family history with the patient and they are unchanged from previous note.  ALLERGIES:  has No Known Allergies.  MEDICATIONS:  Current Outpatient Medications  Medication Sig Dispense Refill  . albuterol (PROVENTIL) (2.5 MG/3ML) 0.083% nebulizer solution Take 3 mLs (2.5 mg total) by nebulization every 6 (six) hours as needed for wheezing. 1080 mL 3  . albuterol (VENTOLIN HFA) 108 (90 Base) MCG/ACT inhaler INHALE 1 TO 2 PUFFS INTO THE LUNGS EVERY 6 HOURS AS NEEDED FOR WHEEZING OR SHORTNESS OF BREATH 54 g 1  . alendronate (FOSAMAX) 70 MG tablet TAKE 1 TABLET BY MOUTH EVERY 7 DAYS. TAKE WITH A FULL GLASS OF WATER ON AN EMPTY STOMACH. 12 tablet 3  . atorvastatin (LIPITOR) 40 MG tablet Take 1 tablet (40 mg total) by mouth daily. 90 tablet 3  . benazepril (LOTENSIN) 20 MG tablet TAKE 1 TABLET(20 MG) BY MOUTH DAILY 90 tablet 3  . calcium carbonate (OS-CAL) 600 MG TABS tablet Take by mouth.    . celecoxib (CELEBREX) 100 MG capsule TAKE 1 CAPSULE(100 MG) BY MOUTH TWICE DAILY 90 capsule 1  . cholecalciferol (VITAMIN D) 1000 units tablet Take 2,000 Units by mouth daily.    . diclofenac Sodium (VOLTAREN) 1 % GEL Apply 2 g topically 4 (four) times daily. 100 g 2  .  famotidine (PEPCID) 40 MG tablet TAKE 1/2 TABLET(20 MG) BY MOUTH TWICE DAILY 30 tablet 3  . fluticasone (FLONASE) 50 MCG/ACT nasal spray Place 1 spray into both nostrils daily. 16 g 3  . Fluticasone-Salmeterol (ADVAIR DISKUS) 100-50 MCG/DOSE AEPB Inhale 1 puff into the lungs 2 (two) times daily. 60 each 11  . furosemide (LASIX) 20 MG tablet take 1 tablet by mouth once daily 30 tablet 11  . glipiZIDE (GLUCOTROL XL) 2.5 MG 24 hr tablet Take 1 tablet (2.5 mg total) by mouth daily with breakfast. 30 tablet 2  . hydrochlorothiazide (MICROZIDE) 12.5 MG capsule TAKE 1 CAPSULE(12.5 MG) BY MOUTH DAILY 90 capsule 1  . HYDROcodone-acetaminophen (NORCO/VICODIN) 5-325 MG tablet Take 1 tablet by mouth 2 (two) times daily as needed for severe pain. 30 tablet 0  . ipratropium (ATROVENT) 0.02 % nebulizer solution Take 0.5 mg by nebulization every 6 (six) hours as needed for wheezing or shortness of breath.    . latanoprost (XALATAN) 0.005 % ophthalmic solution PLACE 1 DROP INTO BOTH EYES AT BEDTIME  0  . loperamide (LOPERAMIDE A-D) 2 MG tablet Take 1 tablet (2 mg total) by mouth 2 (two) times daily as needed for diarrhea or loose stools. 6 tablet 0  . loratadine (CLARITIN) 10  MG tablet Take 1 tablet (10 mg total) by mouth daily. 30 tablet 11  . meclizine (ANTIVERT) 12.5 MG tablet TAKE 1 TABLET(12.5 MG) BY MOUTH TWICE DAILY AS NEEDED FOR DIZZINESS 15 tablet 3  . ondansetron (ZOFRAN) 4 MG tablet Take 1-2 tablets (4-8 mg total) by mouth every 8 (eight) hours as needed for nausea or vomiting. 15 tablet 0  . ONETOUCH ULTRA test strip USE TO TEST BLOOD SUGAR  ONCE DAILY 50 strip 6  . risperiDONE (RISPERDAL) 0.25 MG tablet Take 0.25 mg by mouth daily as needed.    . sitaGLIPtin (JANUVIA) 25 MG tablet Take 1 tablet (25 mg total) by mouth daily. 90 tablet 1  . traZODone (DESYREL) 50 MG tablet Take 25-50 mg by mouth at bedtime as needed for sleep.     Marland Kitchen triamcinolone cream (KENALOG) 0.1 % apply to affected area twice a day  454 g 1   No current facility-administered medications for this visit.    PHYSICAL EXAMINATION: ECOG PERFORMANCE STATUS: {CHL ONC ECOG [phone]}  There were no vitals filed for this visit. There were no vitals filed for this visit. *** GENERAL:alert, no distress and comfortable SKIN: skin color, texture, turgor are normal, no rashes or significant lesions EYES: normal, Conjunctiva are pink and non-injected, sclera clear {OROPHARYNX:no exudate, no erythema and lips, buccal mucosa, and tongue normal}  NECK: supple, thyroid normal size, non-tender, without nodularity LYMPH:  no palpable lymphadenopathy in the cervical, axillary {or inguinal} LUNGS: clear to auscultation and percussion with normal breathing effort HEART: regular rate & rhythm and no murmurs and no lower extremity edema ABDOMEN:abdomen soft, non-tender and normal bowel sounds Musculoskeletal:no cyanosis of digits and no clubbing  NEURO: alert & oriented x 3 with fluent speech, no focal motor/sensory deficits  LABORATORY DATA:  I have reviewed the data as listed CBC Latest Ref Rng & Units 01/28/2020 11/13/2019 10/04/2018  WBC 4.0 - 10.5 K/uL 6.8 5.6 6.2  Hemoglobin 12.0 - 15.0 g/dL 13.7 12.8 13.0  Hematocrit 36.0 - 46.0 % 43.1 39.4 39.5  Platelets 150 - 400 K/uL 242 279 259     CMP Latest Ref Rng & Units 01/28/2020 12/26/2019 11/13/2019  Glucose 70 - 99 mg/dL 162(H) 138(H) 92  BUN 8 - 23 mg/dL _0 Creatinine 0.44 - 1.00 mg/dL 0.76 0.78 0.74  Sodium 135 - 145 mmol/L 138 139 141  Potassium 3.5 - 5.1 mmol/L 3.7 4.4 4.2  Chloride 98 - 111 mmol/L 95(L) 96 101  CO2 22 - 32 mmol/L _1 Calcium 8.9 - 10.3 mg/dL 10.0 9.8 9.6  Total Protein 6.5 - 8.1 g/dL 8.0 6.7 6.4  Total Bilirubin 0.3 - 1.2 mg/dL 1.0 0.3 0.3  Alkaline Phos 38 - 126 U/L 306(H) 210(H) 122(H)  AST 15 - 41 U/L 88(H) 94(H) 30  ALT 0 - 44 U/L 83(H) 86(H) 25      RADIOGRAPHIC STUDIES: I have personally reviewed the radiological images as  listed and agreed with the findings in the report. No results found.   ASSESSMENT & PLAN:  Mariah Henderson is a 70 y.o. female with   1. Pancreatic and Liver masses concerning for metastatic pancreatic cancer  -I discussed and personally reviewed her image findings with patient and her sister in great detail. Her CT AP from 01/13/20 showed 2cm mass on tail of pancreas. The mass abuts the posterior gastric wall but without definite gastric invasion. Scan also shows multiple (at least 5) hepatic masses highly suspicious for metastatic  lesions and Questionable 0.8 by 0.8 cm omental tumor nodule versus dense diverticulum above the transverse colon. -This presentation is highly suspicious of pancreatic adenocarcinoma with liver metastasis.  -We discussed her liver biopsy from 01/28/20 and CT chest from 02/02/20 which shows ***    {For definitive diagnosis, I recommend liver biopsy. I also recommend CT chest to evaluate for distant metastasis and complete staging. She is agreeable.  -I discussed if her liver biopsy confirms pancreatic metastasis, her cancer would be stage IV and no longer curable or eligible for surgery. I discussed this is still treatable, mainly with systemic chemotherapy if this is adenocarcinoma.  -I will obtain genomic testing such as FO on her biopsy sample and genetic testing to see if she is eligible for target or immunotherapy. She is agreeable.  -I discussed option of participating in [employer] study. She is interested.  -F/u 2 days after biopsy }   2. Abdominal Pain, low appetite, weight loss, Transmanitis  -She has had abdominal pain for the past month, 5/10. She was prescribed hydrocodone but did not pick up. She is fine to use OTC pain medication.  -She also has had sudden loss of appetite and weight loss since October 2021. She has overall lost 10 pounds.  -Her blood work 2 months ago was normal, but LFTs on 01/05/20 labs were abnormal with Alk Phos 210, AST 94,  ALT 86, normal Tbili.    3. Comorbidities: Asthma, Borderline Diabetic, GERD, Heart murmur, HLD, HTN, arthritis  -On medications, including multiple diuretics.  -Managed by PCP.   4. Social support -She lives alone in 1 level house and she is retired. She has 1 adult son who lives in [place]. She has a twin and 3 other sisters who live in [place], and other siblings and relative in [place].  -She notes having faith and maintaining positive outlook.    PLAN:  *** -CT Chest in 1-2 weeks  -IR Liver biopsy in 1-2 weeks -F/u 2 days after biopsy  -genetic referral   No problem-specific Assessment & Plan notes found for this encounter.   No orders of the defined types were placed in this encounter.  All questions were answered. The patient knows to call the clinic with any problems, questions or concerns. No barriers to learning was detected. The total time spent in the appointment was {CHL ONC TIME VISIT - GUYQI:[id number]}.     Joslyn Devon 01/30/2020   Oneal Deputy, am acting as scribe for Truitt Merle, MD.   {Add scribe attestation statement}

## 2020-01-30 NOTE — Assessment & Plan Note (Signed)
Currently undergoing work up for pancreatic and liver masses, possible pancreatic adenocarcinoma.  I expressed my support and offered to speak with her family if they had questions.

## 2020-01-30 NOTE — Telephone Encounter (Signed)
Cancelled appts per 1/14 secure chat. Called pt to confirm cancellations. Sent inbasket messages to providers for directions on rescheduling.

## 2020-01-30 NOTE — Telephone Encounter (Signed)
Genetics appointment rescheduled to Wednesday Jan 19th at 11am.  Patient disclosed unable to make other Oncology appointments today due to not feeling well after procedure.  Contacted scheduling to reschedule other Oncology appointments.

## 2020-01-30 NOTE — Assessment & Plan Note (Signed)
I considered multiple options for Mariah Henderson.  She would prefer to be on therapy at this time.  She has not tolerated metformin in the past.  She is not interested in a DDP4 given her new diagnosis.  I would not want to cause her dehydration with an SGLT2 inhibitor given her lack of adequate intake and weight loss.  We discussed a sulfonylurea, specifically low dose glipizide 2.5mg  as an option.  Sulfonylureas can cause hypoglycemia, but I think at this dose she should be okay.  I advised her of this risk and advised her to try to eat something for each meal.  If she feels that she will not eat in a day, she should not take it.  She was amenable to this plan  Plan Stop Januvia  Start Glipizide 2.5mg  daily.  Follow up in 2 months for A1C.

## 2020-02-02 ENCOUNTER — Ambulatory Visit: Payer: Medicare Other | Admitting: Hematology

## 2020-02-02 ENCOUNTER — Other Ambulatory Visit: Payer: Medicare Other

## 2020-02-02 ENCOUNTER — Ambulatory Visit (HOSPITAL_COMMUNITY): Payer: Medicare Other

## 2020-02-02 ENCOUNTER — Encounter: Payer: Medicare Other | Admitting: Genetic Counselor

## 2020-02-02 DIAGNOSIS — C252 Malignant neoplasm of tail of pancreas: Secondary | ICD-10-CM

## 2020-02-03 ENCOUNTER — Telehealth: Payer: Self-pay

## 2020-02-03 DIAGNOSIS — C252 Malignant neoplasm of tail of pancreas: Secondary | ICD-10-CM

## 2020-02-03 NOTE — Telephone Encounter (Signed)
I spoke to patient regarding her missed appointments.  She states it was due to the road conditions. She states she is doing fine, eating and drinking okay.  Her CT has been rescheduled for next Wednesday 02/11/2020 and she wants her follow up with Dr. Burr Medico that afternoon as her sister has to bring her and she is off that day.  I have scheduled her for 1/26 at 2:40 and she is aware.  I have sent a scheduling message to move her lab and genetics counseling appointments from 1/19 to 1/26 per her request.

## 2020-02-04 ENCOUNTER — Inpatient Hospital Stay: Payer: Medicare Other | Admitting: Genetic Counselor

## 2020-02-04 ENCOUNTER — Telehealth: Payer: Self-pay | Admitting: Hematology

## 2020-02-04 ENCOUNTER — Inpatient Hospital Stay: Payer: Medicare Other

## 2020-02-04 ENCOUNTER — Other Ambulatory Visit: Payer: Self-pay | Admitting: Genetic Counselor

## 2020-02-04 DIAGNOSIS — C252 Malignant neoplasm of tail of pancreas: Secondary | ICD-10-CM

## 2020-02-04 NOTE — Telephone Encounter (Signed)
R/s appt per 1/18 sch msg - left message for sister with new appt date and time

## 2020-02-09 NOTE — Progress Notes (Signed)
Mariah Henderson   [phone] Fax:([phone]   Clinic Follow up Note   Patient Care Team: Sid Falcon, MD as PCP - General (Internal Medicine) Forrest Moron, [place] (Optometry) Jonnie Finner, RN as Oncology Nurse Navigator Truitt Merle, MD as Consulting Physician (Oncology) Gwyndolyn Kaufman, RN as Registered Nurse  Date of Service:  02/11/2020  CHIEF COMPLAINT:  F/u Pancreatic cancer with liver metastasis   SUMMARY OF ONCOLOGIC HISTORY: Oncology History Overview Note  Cancer Staging No matching staging information was found for the patient.    Pancreatic cancer ([place])  01/13/2020 Imaging   US Abdomen 01/13/20  IMPRESSION: 1. Suggestion of 3 vascular hypoechoic mass within the liver measuring up to 2.5 cm. 2. Suggestion of a hypoechoic mass within the tail of the pancreas that is not well visualized. 3. Findings concerning for malignancy, please see separately dictated CT abdomen pelvis 01/13/2020.   01/13/2020 Imaging   CT AP 01/13/20  IMPRESSION: 1. Hypoenhancing 2.0 cm in long axis mass in the pancreatic tail compatible with pancreatic adenocarcinoma. The mass abuts the posterior gastric wall but without definite gastric invasion. The splenic vein appears narrowed/attenuated compared to prior exams and the lesion abuts the margin of the splenic artery. 2. Hypoenhancing hepatic masses are new compared to 11/22/2017 and are highly suspicious for metastatic lesions. 3. Questionable 0.8 by 0.8 cm omental tumor nodule versus dense diverticulum above the transverse colon. 4. Other imaging findings of potential clinical significance: Mild distal esophageal wall thickening, query esophagitis. Stable left adrenal adenoma. Sigmoid colon diverticulosis. Lumbar and thoracic spondylosis and degenerative disc disease contributing to multilevel impingement. 5. Aortic atherosclerosis. Aortic Atherosclerosis (ICD10-I70.0).   01/21/2020 Initial  Diagnosis   Pancreatic cancer ([place])   01/28/2020 Initial Biopsy   FINAL MICROSCOPIC DIAGNOSIS:   A. LIVER, NEEDLE CORE BIOPSY:  - Adenocarcinoma.   COMMENT:   Immunohistochemistry for CK7 is positive.  CDX-2 demonstrates weak to  moderate positive staining.  TTF-1 and PAX 8 demonstrate weak, likely  nonspecific staining.  CK20, GATA-3 and ER are negative.  The  morphologic and immunophenotypic characteristics are compatible with the  clinical impression of a primary pancreatic cancer.  Radiologic  correlation is encouraged.  If applicable, there is likely sufficient  tissue for ancillary studies (Block A2).  Dr. Vic Ripper reviewed the  case.     Chemotherapy   First-line Gemcitabine and Abraxane every 1-2 weeks on/1 week off starting next week.     02/18/2020 -  Chemotherapy    Patient is on Treatment Plan: PANCREATIC ABRAXANE / GEMCITABINE D1,8,15 Q28D         CURRENT THERAPY:  Pending First-line Gemcitabine and Abraxane every 1-2 weeks on/1 week off starting next week.   INTERVAL HISTORY:  Mariah Henderson is here for a follow up. She presents to the clinic with her sister. She notes she is doing well. She notes mild weight loss. She notes she still has abdominal pain but now intermittent. She will use Tylenol as needed. She tries to not use Norco. She notes although her appetite has picked up, she get full quickly. She eats through out the day. She still lives alone and able to take care of herself independently. She keeps herself busy. She notes she has a son who lives in [place]. Her sister is in [place]. She has other sisters locally as well. She plans to see Genetic counselor next week.    REVIEW OF SYSTEMS:   Constitutional: Denies fevers, chills or abnormal  weight loss Eyes: Denies blurriness of vision Ears, nose, mouth, throat, and face: Denies mucositis or sore throat Respiratory: Denies cough, dyspnea or wheezes Cardiovascular: Denies palpitation, chest  discomfort or lower extremity swelling Gastrointestinal:  Denies nausea, heartburn or change in bowel  (+) Intermittent abdominal pain.  Skin: Denies abnormal skin rashes Lymphatics: Denies new lymphadenopathy or easy bruising Neurological:Denies numbness, tingling or new weaknesses Behavioral/Psych: Mood is stable, no new changes  All other systems were reviewed with the patient and are negative.  MEDICAL HISTORY:  Past Medical History:  Diagnosis Date  . Acute medial meniscus tear    right knee  . Anxiety   . Asthma   . Cervical spondylosis   . Chronic serous otitis media   . Constipation   . COPD (chronic obstructive pulmonary disease) ([place])   . Depression    followed by [name]; no longer of depakote, seroquel  . Diabetes mellitus without complication ([place])   . Dysuria 12/27/2009   Qualifier: Diagnosis of  By: [name] MD, Amanjot    . Foot drop   . GERD (gastroesophageal reflux disease)   . Heart murmur   . Hyperlipidemia   . Hypertension   . Low back pain   . Obesity   . Paraumbilical hernia   . Pre-diabetes     SURGICAL HISTORY: Past Surgical History:  Procedure Laterality Date  . ABDOMINAL HYSTERECTOMY    . ANTERIOR CERVICAL DECOMP/DISCECTOMY FUSION N/A 10/05/2014   Procedure: ACDF - C5-C6 - C6-C7;  Surgeon: Karie Chimera, MD;  Location: [facility];  Service: Neurosurgery;  Laterality: N/A;  ACDF - C5-C6 - C6-C7  . CHOLECYSTECTOMY    . COLONOSCOPY    . FOOT SURGERY    . HERNIA REPAIR  1999  . IR US GUIDANCE  01/28/2020  . KNEE ARTHROSCOPY WITH MEDIAL MENISECTOMY Right 06/14/2016   Procedure: RIGHT KNEE ARTHROSCOPY WITH PARTIAL MEDIAL MENISCECTOMY, SUBCHONDROPLASTY;  Surgeon: Leandrew Koyanagi, MD;  Location: [facility];  Service: Orthopedics;  Laterality: Right;  . KNEE ARTHROSCOPY WITH SUBCHONDROPLASTY Right 06/14/2016   Procedure: KNEE ARTHROSCOPY WITH SUBCHONDROPLASTY;  Surgeon: Leandrew Koyanagi, MD;  Location: [facility];  Service:  Orthopedics;  Laterality: Right;  . RADIOACTIVE SEED GUIDED EXCISIONAL BREAST BIOPSY Left 06/23/2014   Procedure: RADIOACTIVE SEED GUIDED EXCISIONAL BREAST BIOPSY;  Surgeon: Stark Klein, MD;  Location: [facility];  Service: General;  Laterality: Left;    I have reviewed the social history and family history with the patient and they are unchanged from previous note.  ALLERGIES:  has No Known Allergies.  MEDICATIONS:  Current Outpatient Medications  Medication Sig Dispense Refill  . albuterol (PROVENTIL) (2.5 MG/3ML) 0.083% nebulizer solution Take 3 mLs (2.5 mg total) by nebulization every 6 (six) hours as needed for wheezing. 1080 mL 3  . albuterol (VENTOLIN HFA) 108 (90 Base) MCG/ACT inhaler INHALE 1 TO 2 PUFFS INTO THE LUNGS EVERY 6 HOURS AS NEEDED FOR WHEEZING OR SHORTNESS OF BREATH 54 g 1  . alendronate (FOSAMAX) 70 MG tablet TAKE 1 TABLET BY MOUTH EVERY 7 DAYS. TAKE WITH A FULL GLASS OF WATER ON AN EMPTY STOMACH. 12 tablet 3  . atorvastatin (LIPITOR) 40 MG tablet Take 1 tablet (40 mg total) by mouth daily. 90 tablet 3  . benazepril (LOTENSIN) 20 MG tablet TAKE 1 TABLET(20 MG) BY MOUTH DAILY 90 tablet 3  . calcium carbonate (OS-CAL) 600 MG TABS tablet Take by mouth.    . celecoxib (CELEBREX) 100  MG capsule TAKE 1 CAPSULE(100 MG) BY MOUTH TWICE DAILY 90 capsule 1  . cholecalciferol (VITAMIN D) 1000 units tablet Take 2,000 Units by mouth daily.    . diclofenac Sodium (VOLTAREN) 1 % GEL Apply 2 g topically 4 (four) times daily. 100 g 2  . famotidine (PEPCID) 40 MG tablet TAKE 1/2 TABLET(20 MG) BY MOUTH TWICE DAILY 30 tablet 3  . fluticasone (FLONASE) 50 MCG/ACT nasal spray Place 1 spray into both nostrils daily. 16 g 3  . Fluticasone-Salmeterol (ADVAIR DISKUS) 100-50 MCG/DOSE AEPB Inhale 1 puff into the lungs 2 (two) times daily. 60 each 11  . furosemide (LASIX) 20 MG tablet take 1 tablet by mouth once daily 30 tablet 11  . glipiZIDE (GLUCOTROL XL) 2.5 MG 24 hr tablet Take 1  tablet (2.5 mg total) by mouth daily with breakfast. 30 tablet 2  . hydrochlorothiazide (MICROZIDE) 12.5 MG capsule TAKE 1 CAPSULE(12.5 MG) BY MOUTH DAILY 90 capsule 1  . HYDROcodone-acetaminophen (NORCO/VICODIN) 5-325 MG tablet Take 1 tablet by mouth 2 (two) times daily as needed for severe pain. 30 tablet 0  . ipratropium (ATROVENT) 0.02 % nebulizer solution Take 0.5 mg by nebulization every 6 (six) hours as needed for wheezing or shortness of breath.    . latanoprost (XALATAN) 0.005 % ophthalmic solution PLACE 1 DROP INTO BOTH EYES AT BEDTIME  0  . loperamide (LOPERAMIDE A-D) 2 MG tablet Take 1 tablet (2 mg total) by mouth 2 (two) times daily as needed for diarrhea or loose stools. 6 tablet 0  . loratadine (CLARITIN) 10 MG tablet Take 1 tablet (10 mg total) by mouth daily. 30 tablet 11  . meclizine (ANTIVERT) 12.5 MG tablet TAKE 1 TABLET(12.5 MG) BY MOUTH TWICE DAILY AS NEEDED FOR DIZZINESS 15 tablet 3  . ondansetron (ZOFRAN) 4 MG tablet Take 1-2 tablets (4-8 mg total) by mouth every 8 (eight) hours as needed for nausea or vomiting. 15 tablet 0  . ONETOUCH ULTRA test strip USE TO TEST BLOOD SUGAR  ONCE DAILY 50 strip 6  . risperiDONE (RISPERDAL) 0.25 MG tablet Take 0.25 mg by mouth daily as needed.    . sitaGLIPtin (JANUVIA) 25 MG tablet Take 1 tablet (25 mg total) by mouth daily. 90 tablet 1  . traZODone (DESYREL) 50 MG tablet Take 25-50 mg by mouth at bedtime as needed for sleep.     Marland Kitchen triamcinolone cream (KENALOG) 0.1 % apply to affected area twice a day 454 g 1   No current facility-administered medications for this visit.    PHYSICAL EXAMINATION: ECOG PERFORMANCE STATUS: 1 - Symptomatic but completely ambulatory  Vitals:   02/11/20 1458  BP: (!) 125/59  Pulse: 81  Resp: 15  Temp: 98.6 F (37 C)  SpO2: 100%   Filed Weights   02/11/20 1458  Weight: 143 lb 12.8 oz (65.2 kg)    GENERAL:alert, no distress and comfortable SKIN: skin color, texture, turgor are normal, no rashes  or significant lesions EYES: normal, Conjunctiva are pink and non-injected, sclera clear NECK: supple, thyroid normal size, non-tender, without nodularity LYMPH:  no palpable lymphadenopathy in the cervical, axillary  LUNGS: clear to auscultation and percussion with normal breathing effort HEART: regular rate & rhythm and no murmurs and no lower extremity edema ABDOMEN:abdomen soft, non-tender and normal bowel sounds Musculoskeletal:no cyanosis of digits and no clubbing  NEURO: alert & oriented x 3 with fluent speech, no focal motor/sensory deficits  LABORATORY DATA:  I have reviewed the data as listed CBC Latest Ref  Rng & Units 01/28/2020 11/13/2019 10/04/2018  WBC 4.0 - 10.5 K/uL 6.8 5.6 6.2  Hemoglobin 12.0 - 15.0 g/dL 13.7 12.8 13.0  Hematocrit 36.0 - 46.0 % 43.1 39.4 39.5  Platelets 150 - 400 K/uL 242 279 259     CMP Latest Ref Rng & Units 01/28/2020 12/26/2019 11/13/2019  Glucose 70 - 99 mg/dL 162(H) 138(H) 92  BUN 8 - 23 mg/dL '13 13 10  ' Creatinine 0.44 - 1.00 mg/dL 0.76 0.78 0.74  Sodium 135 - 145 mmol/L 138 139 141  Potassium 3.5 - 5.1 mmol/L 3.7 4.4 4.2  Chloride 98 - 111 mmol/L 95(L) 96 101  CO2 22 - 32 mmol/L '28 29 28  ' Calcium 8.9 - 10.3 mg/dL 10.0 9.8 9.6  Total Protein 6.5 - 8.1 g/dL 8.0 6.7 6.4  Total Bilirubin 0.3 - 1.2 mg/dL 1.0 0.3 0.3  Alkaline Phos 38 - 126 U/L 306(H) 210(H) 122(H)  AST 15 - 41 U/L 88(H) 94(H) 30  ALT 0 - 44 U/L 83(H) 86(H) 25      RADIOGRAPHIC STUDIES: I have personally reviewed the radiological images as listed and agreed with the findings in the report. No results found.   ASSESSMENT & PLAN:  Mariah Henderson is a 70 y.o. female with    1. Pancreatic cancer with Liver metastasis   -Her CT AP from 01/13/20 showed 2cm mass on tail of pancreas. The mass abuts the posterior gastric wall but without definite gastric invasion. Scan also shows multiple (at least 5) hepatic masses highly suspicious for metastatic lesions and Questionable 0.8  by 0.8 cm omental tumor nodule versus dense diverticulum above the transverse colon. -I discussed her liver biopsy from 01/28/20 showed adenocarcinoma from pancreatic primary. I reviewed this with patient today.  -She will proceed with CT Chest today, but her staging is still the same at this point.  -She has stage IV disease given liver metastasis. Her cancer is not curable, but still treatable to control her disease and prolong her life.  -I discussed treatment options are systemic with FOLFIRINOX q2weeks or Gemcitabine and Abraxane 1-2 weeks on/1 week off for first line treatment. Given her age and living alone, I recommend Gem/Abraxane every 2 weeks.   --Chemotherapy consent: Side effects including but does not limited to, fatigue, nausea, vomiting, diarrhea, hair loss, neuropathy, fluid retention, renal and kidney dysfunction, neutropenic fever, needed for blood transfusion, bleeding, were discussed with patient in great detail. She agrees to proceed with Gem/Abraxane. -the goal of therapy is palliative to prolong her life  -I will obtain genomic testing on her biopsy sample to see if she is eligible for target or immunotherapy.  -F/u with first cycle chemo    2. Abdominal Pain, low appetite, weight loss, Transmanitis  -She has had abdominal pain for the past month, 5/10. She was prescribed hydrocodone but did not pick up. She is fine to use OTC pain medication.  -She also has had sudden loss of appetite and weight loss since October 2021. She has overall lost 10 pounds. Her weight continues to trend down.  -Her blood work 2 months ago was normal, but LFTs on 01/05/20 labs were abnormal with Alk Phos 210, AST 94, ALT 86, normal Tbili. Will watch for jaundice, she does have darker urine.  -Her abdominal pain recently improved, now intermittent. She continues Tylenol more than Norco.   3. Comorbidities: Asthma, Borderline Diabetic, GERD, Heart murmur, HLD, HTN, arthritis  -On medications,  including multiple diuretics.  -Managed by PCP.  4. Social support -She lives alone in 1 level house and she is retired. She has 1 adult son who lives in [place]. She has a twin and 3 other sisters who live in [place], and other siblings and relative in [place].  -She notes having faith and maintaining positive outlook.    PLAN:  -I called in Zofran and Compazine -CT Chest today  -PAC placement next week by IR  -Chemo education class next week  -Lab, flush, F/u and Gemcitabine and Abraxane next week -I will call her son to update him on her condition and treatment.    No problem-specific Assessment & Plan notes found for this encounter.   Orders Placed This Encounter  Procedures  . IR IMAGING GUIDED PORT INSERTION    Standing Status:   Future    Standing Expiration Date:   02/10/2021    Order Specific Question:   Reason for Exam (SYMPTOM  OR DIAGNOSIS REQUIRED)    Answer:   chemo    Order Specific Question:   Preferred Imaging Location?    Answer:   [facility]   All questions were answered. The patient knows to call the clinic with any problems, questions or concerns. No barriers to learning was detected. The total time spent in the appointment was 40 minutes.     Truitt Merle, MD 02/11/2020   I, Joslyn Devon, am acting as scribe for Truitt Merle, MD.   I have reviewed the above documentation for accuracy and completeness, and I agree with the above.

## 2020-02-10 ENCOUNTER — Other Ambulatory Visit: Payer: Self-pay

## 2020-02-10 DIAGNOSIS — C252 Malignant neoplasm of tail of pancreas: Secondary | ICD-10-CM

## 2020-02-11 ENCOUNTER — Encounter: Payer: Self-pay | Admitting: Hematology

## 2020-02-11 ENCOUNTER — Other Ambulatory Visit: Payer: Self-pay

## 2020-02-11 ENCOUNTER — Inpatient Hospital Stay: Payer: Medicare Other | Admitting: Hematology

## 2020-02-11 ENCOUNTER — Inpatient Hospital Stay: Payer: Medicare Other

## 2020-02-11 ENCOUNTER — Ambulatory Visit (HOSPITAL_COMMUNITY)
Admission: RE | Admit: 2020-02-11 | Discharge: 2020-02-11 | Disposition: A | Discharge status: Home / Self Care | Payer: Medicare Other | Source: Ambulatory Visit | Attending: Hematology | Admitting: Hematology

## 2020-02-11 ENCOUNTER — Encounter (HOSPITAL_COMMUNITY): Payer: Self-pay

## 2020-02-11 ENCOUNTER — Inpatient Hospital Stay: Payer: Medicare Other | Admitting: Genetic Counselor

## 2020-02-11 VITALS — BP 125/59 | HR 81 | Temp 98.6°F | Resp 15 | Ht 60.0 in | Wt 143.8 lb

## 2020-02-11 DIAGNOSIS — R160 Hepatomegaly, not elsewhere classified: Secondary | ICD-10-CM | POA: Diagnosis not present

## 2020-02-11 DIAGNOSIS — C252 Malignant neoplasm of tail of pancreas: Secondary | ICD-10-CM | POA: Diagnosis not present

## 2020-02-11 DIAGNOSIS — Z7189 Other specified counseling: Secondary | ICD-10-CM | POA: Diagnosis not present

## 2020-02-11 DIAGNOSIS — K869 Disease of pancreas, unspecified: Secondary | ICD-10-CM | POA: Diagnosis not present

## 2020-02-11 DIAGNOSIS — I10 Essential (primary) hypertension: Secondary | ICD-10-CM | POA: Diagnosis not present

## 2020-02-11 DIAGNOSIS — Z803 Family history of malignant neoplasm of breast: Secondary | ICD-10-CM | POA: Diagnosis not present

## 2020-02-11 DIAGNOSIS — Z800 Family history of malignant neoplasm of digestive organs: Secondary | ICD-10-CM | POA: Diagnosis not present

## 2020-02-11 DIAGNOSIS — J984 Other disorders of lung: Secondary | ICD-10-CM | POA: Diagnosis not present

## 2020-02-11 DIAGNOSIS — R109 Unspecified abdominal pain: Secondary | ICD-10-CM | POA: Diagnosis not present

## 2020-02-11 DIAGNOSIS — Z87891 Personal history of nicotine dependence: Secondary | ICD-10-CM | POA: Diagnosis not present

## 2020-02-11 DIAGNOSIS — I2510 Atherosclerotic heart disease of native coronary artery without angina pectoris: Secondary | ICD-10-CM | POA: Diagnosis not present

## 2020-02-11 DIAGNOSIS — I700 Atherosclerosis of aorta: Secondary | ICD-10-CM | POA: Diagnosis not present

## 2020-02-11 DIAGNOSIS — C787 Secondary malignant neoplasm of liver and intrahepatic bile duct: Secondary | ICD-10-CM | POA: Diagnosis not present

## 2020-02-11 MED ORDER — PROCHLORPERAZINE MALEATE 10 MG PO TABS: 10.0000 mg | ORAL_TABLET | Freq: Four times a day (QID) | ORAL | 1 refills | Status: DC | PRN

## 2020-02-11 MED ORDER — LIDOCAINE-PRILOCAINE 2.5-2.5 % EX CREA: TOPICAL_CREAM | CUTANEOUS | 3 refills | Status: DC

## 2020-02-11 MED ORDER — ONDANSETRON HCL 8 MG PO TABS: 8.0000 mg | ORAL_TABLET | Freq: Two times a day (BID) | ORAL | 1 refills | Status: DC | PRN

## 2020-02-11 NOTE — Progress Notes (Signed)
START ON PATHWAY REGIMEN - Pancreatic Adenocarcinoma     A cycle is every 28 days:     Nab-paclitaxel (protein bound)      Gemcitabine   **Always confirm dose/schedule in your pharmacy ordering system**  Patient Characteristics: Metastatic Disease, First Line, PS = 0,1, BRCA1/2 and PALB2  Mutation Absent/Unknown Therapeutic Status: Metastatic Disease Line of Therapy: First Line ECOG Performance Status: 1 BRCA1/2 Mutation Status: Awaiting Test Results PALB2 Mutation Status: Awaiting Test Results Intent of Therapy: Non-Curative / Palliative Intent, Discussed with Patient 

## 2020-02-11 NOTE — Telephone Encounter (Signed)
[employer] 2018-01 STUDY: BLOOD SAMPLE COLLECTION TO EVALUATE BIOMARKERS INSUBJECTS WITH UNTREATED SOLID TUMORS  02/11/2020 10:50PM  FOLLOWUP PHONE CALL: Outgoing call to Mariah Henderson as a follow-up to our previous conversation regarding [employer] participation. I confirmed that I was speaking with the correct person and we spoke for a little over five minutes. The appointments originally scheduled for 02/02/19 have been rescheduled for today, including her lab appointment. However, Mariah Henderson is unable to be here today until after 1pm because her sister (source of transportation) has to work until noon. As a result, the genetic counseling visit and subsequent lab appointment have been rescheduled for next Wednesday (02/18/2020). I called Mariah Henderson to inquire whether she would like to proceed with [employer] consent and blood specimen today or wait until next Wednesday since she has a lab appointment on that day for genetics; she verbalizes continued interest in voluntary participation in the blood study. After some brief discussion, Mariah Henderson elects to arrive 30 minutes early next Wednesday (at 1:30pm) to sign consent, complete the history interview and have the [employer] blood specimen drawn at the same time as the genetic labs.  Mariah Henderson is thanked for her time and consideration of participation in the [employer] study and is encouraged to contact me directly with any concerns or questions: she verbalizes understanding. Will plan to consent and collect the blood specimen on 02/18/2020.  Dionne Bucy. Sharlett Iles, BSN, RN, CIC 02/11/2020 11:14 AM

## 2020-02-16 ENCOUNTER — Inpatient Hospital Stay: Payer: Medicare Other

## 2020-02-16 ENCOUNTER — Telehealth: Payer: Self-pay | Admitting: Emergency Medicine

## 2020-02-16 ENCOUNTER — Inpatient Hospital Stay: Payer: Medicare Other | Admitting: Emergency Medicine

## 2020-02-16 ENCOUNTER — Other Ambulatory Visit: Payer: Self-pay

## 2020-02-16 DIAGNOSIS — C252 Malignant neoplasm of tail of pancreas: Secondary | ICD-10-CM

## 2020-02-16 NOTE — Telephone Encounter (Signed)
EXACT SCIENCES 2018-01 STUDY: BLOOD SAMPLE COLLECTION TO EVALUATE BIOMARKERS INSUBJECTS WITH UNTREATED SOLID TUMORS  02/16/20   Outgoing Call:  Called to follow up with patient about exact sciences study since her appointments have been adjusted.  She agreed to meet this afternoon after her chemo education class to discuss the study and review the consent forms.  If she consents, will plan to have research labs drawn at her next routine lab appointment on Wednesday, 02/18/20.  She was thanked for her time and urged to call with any questions.  Clabe Seal Clinical Research Coordinator I  02/16/20  11:45 AM

## 2020-02-16 NOTE — Research (Signed)
EXACT SCIENCES 2018-01 STUDY:BLOOD SAMPLE COLLECTION TO EVALUATE BIOMARKERS INSUBJECTS WITH UNTREATED SOLID TUMORS  02/16/20  Consent:  Patient Mariah Henderson was identified by Dr. Feng as a potential candidate for the above listed study.  This Clinical Research Coordinator met with Ardra Daoud, [id number] on 02/16/20 in a manner and location that ensures patient privacy to discuss participation in the above listed research study.  Patient is accompanied by her sister.  Patient was previously provided with informed consent documents.  Patient confirmed they have read the informed consent documents.  As outlined in the informed consent form, this Coordinator and Evelin Timoney discussed the purpose of the research study, the investigational nature of the study, study procedures and requirements for study participation, potential risks and benefits of study participation, as well as alternatives to participation.  The patient understands participation is voluntary and they may withdraw from study participation at any time.  This study does not involve randomization.  This study does not involve an investigational drug or device. This study does not involve a placebo. Patient understands enrollment is pending full eligibility review.   Confidentiality and how the patient's information will be used as part of study participation were discussed.  Patient was informed there is a $50 gift card reimbursement provided for their time and effort spent on trial participation.  The patient is encouraged to discuss research study participation with their insurance provider to determine what costs they may incur as part of study participation, including research related injury.    All questions were answered to patient's satisfaction.  The informed consent with embedded HIPAA language was reviewed page by page.  The patient's mental and emotional status is appropriate to provide informed consent, and the  patient verbalizes an understanding of study participation.  Patient has agreed to participate in the above listed research study and has voluntarily signed the informed consent version with embedded HIPAA language, version 3.0 on 02/16/20 at 3:05 PM.  The patient was provided with a copy of the signed informed consent form with embedded HIPAA language for their reference.  No study specific procedures were obtained prior to the signing of the informed consent document.  Approximately 20 minutes were spent with the patient reviewing the informed consent documents.  Patient was not requested to complete a Release of Information form.  Eligibility:  Eligibility was reviewed and patient is eligible for this research study.  Second eligibility check performed by Angie Patterson, Clinical Research Nurse.  Family and substance history: Family, tobacco and alcohol history were obtained from the patient through interview and recorded on the history worksheet.  Plan:  Will plan to have labs collected on Wednesday, 02/18/20 at already scheduled lab appointment.     The patient was thanked for her time and participation.    Clinical Research Coordinator I  02/16/20 3:31 PM   

## 2020-02-17 ENCOUNTER — Encounter: Payer: Self-pay | Admitting: *Deleted

## 2020-02-17 ENCOUNTER — Ambulatory Visit: Payer: Medicare Other

## 2020-02-17 ENCOUNTER — Other Ambulatory Visit: Payer: Medicare Other

## 2020-02-17 ENCOUNTER — Encounter: Payer: Self-pay | Admitting: Hematology

## 2020-02-17 DIAGNOSIS — C801 Malignant (primary) neoplasm, unspecified: Secondary | ICD-10-CM

## 2020-02-17 HISTORY — DX: Malignant (primary) neoplasm, unspecified: C80.1

## 2020-02-17 NOTE — Progress Notes (Signed)
Called pt to introduce myself as her Arboriculturist, discuss copay assistance and the [employer].  She gave me consent to apply in her behalf so I applied to the [place] and she was approved for $4,500 from2/1/22to2/1/23 for Abraxane and Gemzar.  I also informed her of the [employer] and went over what it covers.  She would like to apply so she will provide her proof of income on 02/19/20.  If approved I will give her an expense sheet and my card for any questions or concerns she may have in the future.

## 2020-02-17 NOTE — Progress Notes (Signed)
Error

## 2020-02-18 ENCOUNTER — Other Ambulatory Visit: Payer: Medicare Other

## 2020-02-18 ENCOUNTER — Encounter: Payer: Medicare Other | Admitting: Genetic Counselor

## 2020-02-19 ENCOUNTER — Inpatient Hospital Stay: Payer: Medicare Other | Attending: Hematology | Admitting: Genetic Counselor

## 2020-02-19 ENCOUNTER — Inpatient Hospital Stay: Payer: Medicare Other

## 2020-02-19 ENCOUNTER — Encounter: Payer: Self-pay | Admitting: Hematology

## 2020-02-19 ENCOUNTER — Encounter: Payer: Self-pay | Admitting: Emergency Medicine

## 2020-02-19 ENCOUNTER — Other Ambulatory Visit: Payer: Self-pay

## 2020-02-19 DIAGNOSIS — C252 Malignant neoplasm of tail of pancreas: Secondary | ICD-10-CM

## 2020-02-19 DIAGNOSIS — C787 Secondary malignant neoplasm of liver and intrahepatic bile duct: Secondary | ICD-10-CM | POA: Diagnosis not present

## 2020-02-19 DIAGNOSIS — Z803 Family history of malignant neoplasm of breast: Secondary | ICD-10-CM | POA: Diagnosis not present

## 2020-02-19 DIAGNOSIS — Z5111 Encounter for antineoplastic chemotherapy: Secondary | ICD-10-CM | POA: Insufficient documentation

## 2020-02-19 DIAGNOSIS — Z7189 Other specified counseling: Secondary | ICD-10-CM

## 2020-02-19 LAB — CBC WITH DIFFERENTIAL (CANCER CENTER ONLY)
Abs Immature Granulocytes: 0.02 10*3/uL (ref 0.00–0.07)
Basophils Absolute: 0 10*3/uL (ref 0.0–0.1)
Basophils Relative: 1 %
Eosinophils Absolute: 0.3 10*3/uL (ref 0.0–0.5)
Eosinophils Relative: 4 %
HCT: 42.9 % (ref 36.0–46.0)
Hemoglobin: 13.7 g/dL (ref 12.0–15.0)
Immature Granulocytes: 0 %
Lymphocytes Relative: 5 %
Lymphs Abs: 0.4 10*3/uL — ABNORMAL LOW (ref 0.7–4.0)
MCH: 28.1 pg (ref 26.0–34.0)
MCHC: 31.9 g/dL (ref 30.0–36.0)
MCV: 88.1 fL (ref 80.0–100.0)
Monocytes Absolute: 0.6 10*3/uL (ref 0.1–1.0)
Monocytes Relative: 7 %
Neutro Abs: 7.3 10*3/uL (ref 1.7–7.7)
Neutrophils Relative %: 83 %
Platelet Count: 251 10*3/uL (ref 150–400)
RBC: 4.87 MIL/uL (ref 3.87–5.11)
RDW: 13.6 % (ref 11.5–15.5)
WBC Count: 8.7 10*3/uL (ref 4.0–10.5)
nRBC: 0 % (ref 0.0–0.2)

## 2020-02-19 LAB — RESEARCH LABS

## 2020-02-19 LAB — CMP (CANCER CENTER ONLY)
ALT: 85 U/L — ABNORMAL HIGH (ref 0–44)
AST: 95 U/L — ABNORMAL HIGH (ref 15–41)
Albumin: 3.3 g/dL — ABNORMAL LOW (ref 3.5–5.0)
Alkaline Phosphatase: 471 U/L — ABNORMAL HIGH (ref 38–126)
Anion gap: 9 (ref 5–15)
BUN: 11 mg/dL (ref 8–23)
CO2: 32 mmol/L (ref 22–32)
Calcium: 9.7 mg/dL (ref 8.9–10.3)
Chloride: 97 mmol/L — ABNORMAL LOW (ref 98–111)
Creatinine: 0.89 mg/dL (ref 0.44–1.00)
GFR, Estimated: >60 mL/min (ref >60)
Glucose, Bld: 248 mg/dL — ABNORMAL HIGH (ref 70–99)
Potassium: 3.6 mmol/L (ref 3.5–5.1)
Sodium: 138 mmol/L (ref 135–145)
Total Bilirubin: 1.1 mg/dL (ref 0.3–1.2)
Total Protein: 7.6 g/dL (ref 6.5–8.1)

## 2020-02-19 LAB — GENETIC SCREENING ORDER

## 2020-02-19 NOTE — Research (Signed)
EXACT SCIENCES 2018-01 STUDY:BLOOD SAMPLE COLLECTION TO EVALUATE BIOMARKERS INSUBJECTS WITH UNTREATED SOLID TUMORS  02/19/20  Labs: Research labs were drawn through venipuncture at [place] by phlebotomist Unice Bailey.  Lab kit # [id number] was used.    Gift Card:  Patient was given a $50 [employer] gift card after labs were drawn.    The patient was thanked for her time and participation.  Clabe Seal Clinical Research Coordinator I  02/19/20 12:22 PM

## 2020-02-19 NOTE — Progress Notes (Signed)
Pharmacist Chemotherapy Monitoring - Initial Assessment    Anticipated start date: 02/26/20   Regimen:  . Are orders appropriate based on the patient's diagnosis, regimen, and cycle? Yes . Does the plan date match the patient's scheduled date? Yes . Is the sequencing of drugs appropriate? Yes . Are the premedications appropriate for the patient's regimen? Yes . Prior Authorization for treatment is: Approved o If applicable, is the correct biosimilar selected based on the patient's insurance? not applicable  Organ Function and Labs: Marland Kitchen Are dose adjustments needed based on the patient's renal function, hepatic function, or hematologic function? No . Are appropriate labs ordered prior to the start of patient's treatment? Yes . Other organ system assessment, if indicated: N/A . The following baseline labs, if indicated, have been ordered: N/A  Dose Assessment: . Are the drug doses appropriate? Yes . Are the following correct: o Drug concentrations Yes o IV fluid compatible with drug Yes o Administration routes Yes o Timing of therapy Yes . If applicable, does the patient have documented access for treatment and/or plans for port-a-cath placement? yes . If applicable, have lifetime cumulative doses been properly documented and assessed? yes Lifetime Dose Tracking  No doses have been documented on this patient for the following tracked chemicals: Doxorubicin, Epirubicin, Idarubicin, Daunorubicin, Mitoxantrone, Bleomycin, Oxaliplatin, Carboplatin, Liposomal Doxorubicin  o   Toxicity Monitoring/Prevention: . The patient has the following take home antiemetics prescribed: Ondansetron and Prochlorperazine . The patient has the following take home medications prescribed: N/A . Medication allergies and previous infusion related reactions, if applicable, have been reviewed and addressed. Yes . The patient's current medication list has been assessed for drug-drug interactions with their  chemotherapy regimen. no significant drug-drug interactions were identified on review.  Order Review: . Are the treatment plan orders signed? Yes . Is the patient scheduled to see a provider prior to their treatment? No  I verify that I have reviewed each item in the above checklist and answered each question accordingly.  Kennith Center, Pharm.D., CPP 02/19/2020@5 :04 PM

## 2020-02-19 NOTE — Progress Notes (Signed)
Pt is approved for the $1000 [employer] grant.  

## 2020-02-20 ENCOUNTER — Encounter: Payer: Self-pay | Admitting: Genetic Counselor

## 2020-02-20 NOTE — Progress Notes (Signed)
REFERRING PROVIDER: Truitt Merle, MD [address] [place],  [place] [address]  PRIMARY PROVIDER:  Sid Falcon, MD  PRIMARY REASON FOR VISIT:  1. Malignant neoplasm of tail of pancreas ([place])   2. Family history of breast cancer     HISTORY OF PRESENT ILLNESS:   Mariah Henderson, a 70 y.o. female, was seen for a [place] cancer genetics consultation at the request of Dr. Burr Medico due to a personal history of pancreatic cancer.  Mariah Henderson presents to clinic today to discuss the possibility of a hereditary predisposition to cancer, to discuss genetic testing, and to further clarify her future cancer risks, as well as potential cancer risks for family members.   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 The treatment plan includes chemotherapy. She has a personal history of diabetes.   CANCER HISTORY:  Oncology History Overview Note  Cancer Staging No matching staging information was found for the patient.    Pancreatic cancer ([place])  01/13/2020 Imaging   US Abdomen 01/13/20  IMPRESSION: 1. Suggestion of 3 vascular hypoechoic mass within the liver measuring up to 2.5 cm. 2. Suggestion of a hypoechoic mass within the tail of the pancreas that is not well visualized. 3. Findings concerning for malignancy, please see separately dictated CT abdomen pelvis 01/13/2020.   01/13/2020 Imaging   CT AP 01/13/20  IMPRESSION: 1. Hypoenhancing 2.0 cm in long axis mass in the pancreatic tail compatible with pancreatic adenocarcinoma. The mass abuts the posterior gastric wall but without definite gastric invasion. The splenic vein appears narrowed/attenuated compared to prior exams and the lesion abuts the margin of the splenic artery. 2. Hypoenhancing hepatic masses are new compared to 11/22/2017 and are highly suspicious for metastatic lesions. 3. Questionable 0.8 by 0.8 cm omental tumor nodule versus dense diverticulum above the transverse colon. 4.  Other imaging findings of potential clinical significance: Mild distal esophageal wall thickening, query esophagitis. Stable left adrenal adenoma. Sigmoid colon diverticulosis. Lumbar and thoracic spondylosis and degenerative disc disease contributing to multilevel impingement. 5. Aortic atherosclerosis. Aortic Atherosclerosis (ICD10-I70.0).   01/21/2020 Initial Diagnosis   Pancreatic cancer ([place])   01/28/2020 Initial Biopsy   FINAL MICROSCOPIC DIAGNOSIS:   A. LIVER, NEEDLE CORE BIOPSY:  - Adenocarcinoma.   COMMENT:   Immunohistochemistry for CK7 is positive.  CDX-2 demonstrates weak to  moderate positive staining.  TTF-1 and PAX 8 demonstrate weak, likely  nonspecific staining.  CK20, GATA-3 and ER are negative.  The  morphologic and immunophenotypic characteristics are compatible with the  clinical impression of a primary pancreatic cancer.  Radiologic  correlation is encouraged.  If applicable, there is likely sufficient  tissue for ancillary studies (Block A2).  Dr. Vic Ripper reviewed the  case.     Chemotherapy   First-line Gemcitabine and Abraxane every 1-2 weeks on/1 week off starting next week.     02/26/2020 -  Chemotherapy    Patient is on Treatment Plan: PANCREATIC ABRAXANE / GEMCITABINE D1,8,15 Q28D         RISK FACTORS:  Menarche was at age unknown.  First live birth at age 59.  OCP use for approximately 0 years.  Ovaries intact: yes.  Hysterectomy: yes in 1980s.  Menopausal status: postmenopausal.  HRT use: 0 years. Colonoscopy: around 2 years ago per patient Mammogram within the last year: yes. Number of breast biopsies: biopsy reported 2 years ago. Up to date with pelvic exams: several years since most recent pelvic exxam. Any  excessive radiation exposure in the past: no  Past Medical History:  Diagnosis Date  . Acute medial meniscus tear    right knee  . Anxiety   . Asthma   . Cervical spondylosis   . Chronic serous otitis media   .  Constipation   . COPD (chronic obstructive pulmonary disease) ([place])   . Depression    followed by [name]; no longer of depakote, seroquel  . Diabetes mellitus without complication ([place])   . Dysuria 12/27/2009   Qualifier: Diagnosis of  By: [name] MD, Amanjot    . Foot drop   . GERD (gastroesophageal reflux disease)   . Heart murmur   . Hyperlipidemia   . Hypertension   . Low back pain   . Obesity   . Paraumbilical hernia   . Pre-diabetes     Past Surgical History:  Procedure Laterality Date  . ABDOMINAL HYSTERECTOMY    . ANTERIOR CERVICAL DECOMP/DISCECTOMY FUSION N/A 10/05/2014   Procedure: ACDF - C5-C6 - C6-C7;  Surgeon: Karie Chimera, MD;  Location: [facility];  Service: Neurosurgery;  Laterality: N/A;  ACDF - C5-C6 - C6-C7  . CHOLECYSTECTOMY    . COLONOSCOPY    . FOOT SURGERY    . HERNIA REPAIR  1999  . IR US GUIDANCE  01/28/2020  . KNEE ARTHROSCOPY WITH MEDIAL MENISECTOMY Right 06/14/2016   Procedure: RIGHT KNEE ARTHROSCOPY WITH PARTIAL MEDIAL MENISCECTOMY, SUBCHONDROPLASTY;  Surgeon: Leandrew Koyanagi, MD;  Location: [facility];  Service: Orthopedics;  Laterality: Right;  . KNEE ARTHROSCOPY WITH SUBCHONDROPLASTY Right 06/14/2016   Procedure: KNEE ARTHROSCOPY WITH SUBCHONDROPLASTY;  Surgeon: Leandrew Koyanagi, MD;  Location: [facility];  Service: Orthopedics;  Laterality: Right;  . RADIOACTIVE SEED GUIDED EXCISIONAL BREAST BIOPSY Left 06/23/2014   Procedure: RADIOACTIVE SEED GUIDED EXCISIONAL BREAST BIOPSY;  Surgeon: Stark Klein, MD;  Location: [facility];  Service: General;  Laterality: Left;    Social History   Socioeconomic History  . Marital status: Divorced    Spouse name: Not on file  . Number of children: 1  . Years of education: Not on file  . Highest education level: Not on file  Occupational History  . Occupation: retired   Tobacco Use  . Smoking status: Former Smoker    Packs/day: 0.50    Years: 20.00    Pack  years: 10.00    Types: Cigarettes    Quit date: 01/17/1996    Years since quitting: 24.1  . Smokeless tobacco: Never Used  Substance and Sexual Activity  . Alcohol use: No    Alcohol/week: 0.0 standard drinks  . Drug use: No  . Sexual activity: Not Currently  Other Topics Concern  . Not on file  Social History Narrative  . Not on file   Social Determinants of Health   Financial Resource Strain: Medium Risk  . Difficulty of Paying Living Expenses: Somewhat hard  Food Insecurity: No Food Insecurity  . Worried About Charity fundraiser in the Last Year: Never true  . Ran Out of Food in the Last Year: Never true  Transportation Needs: No Transportation Needs  . Lack of Transportation (Medical): No  . Lack of Transportation (Non-Medical): No  Physical Activity: Not on file  Stress: Stress Concern Present  . Feeling of Stress : Rather much  Social Connections: Moderately Integrated  . Frequency of Communication with Friends and Family: More than three times a week  . Frequency of Social Gatherings with  Friends and Family: More than three times a week  . Attends Religious Services: More than 4 times per year  . Active Member of Clubs or Organizations: Yes  . Attends Archivist Meetings: More than 4 times per year  . Marital Status: Widowed     FAMILY HISTORY:  We obtained a detailed, 4-generation family history.  Significant diagnoses are listed below: Family History  Problem Relation Age of Onset  . Breast cancer Sister        dx 6s    Mariah Henderson has one son, age 43, who does not have a cancer history.  Mariah Henderson has several brothers and sisters.  One sister had breast cancer diagnosed in her 47s.  Mariah Henderson mother passed away at age 15 and father passed away at age 85.  No other family history of cancer was reported.   Mariah Henderson is unaware of previous family history of genetic testing for hereditary cancer risks. Patient's maternal ancestors are of African  American descent, and paternal ancestors are of African American descent. There is no reported Ashkenazi Jewish ancestry. There is no known consanguinity.  GENETIC COUNSELING ASSESSMENT: Mariah Henderson is a 70 y.o. female with a personal history of cancer which is somewhat suggestive of a hereditary cancer syndrome and predisposition to cancer given her cancer diagnoses and the presence of related cancers in the family. We, therefore, discussed and recommended the following at today's visit.   DISCUSSION: We discussed that 5 - 10% of cancer is hereditary, with most cases of hereditary pancreatic cancer associated with mutations in BRCA1/2.  There are other genes that can be associated with hereditary pancreatic cancer syndromes.  Type of cancer risk and level of risk are gene-specific.  We discussed that testing is beneficial for several reasons including knowing how to follow individuals after completing their treatment, identifying whether potential treatment options such as PARP inhibitors would be beneficial, and understanding if other family members could be at risk for cancer and allowing them to undergo genetic testing.   We reviewed the characteristics, features and inheritance patterns of hereditary cancer syndromes. We also discussed genetic testing, including the appropriate family members to test, the process of testing, insurance coverage and turn-around-time for results. We discussed the implications of a negative, positive, carrier and/or variant of uncertain significant result. We recommended Mariah Henderson pursue genetic testing for a panel that includes genes associated with pancreatic and breast cancer.   Mariah Henderson  was offered a common hereditary cancer panel and an expanded pan-cancer panel. Mariah Henderson was informed of the benefits and limitations of each panel, including that expanded pan-cancer panels contain genes that do not have clear management guidelines at this point in time.  We also  discussed that as the number of genes included on a panel increases, the chances of variants of uncertain significance increases.  After considering the benefits and limitations of each gene panel, Mariah Henderson  elected to have a common hereditary cancers panel through [facility].  The Common Hereditary Cancers Panel with preliminary evidence pancreatic cancer genes offered by [facility] includes sequencing and/or deletion duplication testing of the following 49 genes: APC, ATM, AXIN2, BARD1, BMPR1A, BRCA1, BRCA2, BRIP1, CDH1, CDK4, CDKN2A (p14ARF), CDKN2A (p16INK4a), CHEK2, CTNNA1, DICER1, EPCAM (Deletion/duplication testing only), FANCC, GREM1 (promoter region deletion/duplication testing only), KIT, MEN1, MLH1, MSH2, MSH3, MSH6, MUTYH, NBN, NF1, NHTL1, PALB2, PALLD, PDGFRA, PMS2, POLD1, POLE, PTEN, RAD50, RAD51C, RAD51D, SDHB, SDHC, SDHD, SMAD4, SMARCA4. STK11, TP53, TSC1, TSC2, and VHL.  The following genes were evaluated for sequence changes only: SDHA and HOXB13 c.251G>A variant only.  Based on Mariah Henderson's personal history of cancer, she meets medical criteria for genetic testing. Despite that she meets criteria, she may still have an out of pocket cost. We also discussed [facility]'s sponsored genetic testing program for those affected with pancreatic cancer. Mariah Henderson was informed that, through this program, [facility] offers testing at no cost to the patient and may elect to share de-identified patient information to third parties and commercial organizations.  After considering the sponsored program and being informed of other cost options, Mariah Henderson elected to proceed with the genetic test through the sponsored program called [facility] Detect Hereditary Pancreatic Cancer.   PLAN: After considering the risks, benefits, and limitations, Mariah Henderson provided informed consent to pursue genetic testing and the blood sample was sent to [facility] for analysis of the Common Hereditary Cancers Panel. Results  should be available within approximately 2-3 weeks' time, at which point they will be disclosed by telephone to Mariah Henderson, as will any additional recommendations warranted by these results. Mariah Henderson will receive a summary of her genetic counseling visit and a copy of her results once available. This information will also be available in Epic.    Lastly, we encouraged Ms. Bedoy to remain in contact with cancer genetics annually so that we can continuously update the family history and inform her of any changes in cancer genetics and testing that may be of benefit for this family.   Ms. Roark questions were answered to her satisfaction today. Our contact information was provided should additional questions or concerns arise. Thank you for the referral and allowing [place] to share in the care of your patient.   Makendra Vigeant M. Joette Catching, [place], [facility] Genetic Counselor [email or web address] (P) [phone]  The patient was seen for a total of 35 minutes in face-to-face genetic counseling.  Drs. Magrinat, Lindi Adie and/or Burr Medico were available to discuss this case as needed.    _______________________________________________________________________ For Office Staff:  Number of people involved in session: 1 Was an Intern/ student involved with case: no

## 2020-02-23 ENCOUNTER — Other Ambulatory Visit: Payer: Self-pay | Admitting: Student

## 2020-02-23 ENCOUNTER — Telehealth: Payer: Self-pay

## 2020-02-23 DIAGNOSIS — E119 Type 2 diabetes mellitus without complications: Secondary | ICD-10-CM | POA: Diagnosis not present

## 2020-02-23 DIAGNOSIS — H25813 Combined forms of age-related cataract, bilateral: Secondary | ICD-10-CM | POA: Diagnosis not present

## 2020-02-23 DIAGNOSIS — H40013 Open angle with borderline findings, low risk, bilateral: Secondary | ICD-10-CM | POA: Diagnosis not present

## 2020-02-23 LAB — HM DIABETES EYE EXAM

## 2020-02-23 NOTE — Telephone Encounter (Signed)
Mariah Henderson left vm requesting pain medication for after her port is placed.  I returned her call and left a vm.  I instructed her to apply ice and use either tylenol or ibuprofen.  I told her that Dr Burr Medico does not prescribe pain medication for that.

## 2020-02-24 ENCOUNTER — Other Ambulatory Visit: Payer: Self-pay | Admitting: Radiology

## 2020-02-24 ENCOUNTER — Other Ambulatory Visit: Payer: Self-pay | Admitting: Student

## 2020-02-25 ENCOUNTER — Telehealth: Payer: Self-pay

## 2020-02-25 ENCOUNTER — Ambulatory Visit (HOSPITAL_COMMUNITY)
Admission: RE | Admit: 2020-02-25 | Discharge: 2020-02-25 | Disposition: A | Discharge status: Home / Self Care | Payer: Medicare Other | Source: Ambulatory Visit | Attending: Hematology | Admitting: Hematology

## 2020-02-25 ENCOUNTER — Encounter (HOSPITAL_COMMUNITY): Payer: Self-pay

## 2020-02-25 ENCOUNTER — Telehealth: Payer: Self-pay | Admitting: *Deleted

## 2020-02-25 ENCOUNTER — Other Ambulatory Visit: Payer: Self-pay

## 2020-02-25 DIAGNOSIS — E669 Obesity, unspecified: Secondary | ICD-10-CM | POA: Insufficient documentation

## 2020-02-25 DIAGNOSIS — C252 Malignant neoplasm of tail of pancreas: Secondary | ICD-10-CM | POA: Diagnosis not present

## 2020-02-25 DIAGNOSIS — Z79899 Other long term (current) drug therapy: Secondary | ICD-10-CM | POA: Diagnosis not present

## 2020-02-25 DIAGNOSIS — Z7983 Long term (current) use of bisphosphonates: Secondary | ICD-10-CM | POA: Insufficient documentation

## 2020-02-25 DIAGNOSIS — Z6825 Body mass index (BMI) 25.0-25.9, adult: Secondary | ICD-10-CM | POA: Diagnosis not present

## 2020-02-25 DIAGNOSIS — I10 Essential (primary) hypertension: Secondary | ICD-10-CM | POA: Diagnosis not present

## 2020-02-25 DIAGNOSIS — E785 Hyperlipidemia, unspecified: Secondary | ICD-10-CM | POA: Diagnosis not present

## 2020-02-25 DIAGNOSIS — Z7951 Long term (current) use of inhaled steroids: Secondary | ICD-10-CM | POA: Diagnosis not present

## 2020-02-25 DIAGNOSIS — Z9049 Acquired absence of other specified parts of digestive tract: Secondary | ICD-10-CM | POA: Diagnosis not present

## 2020-02-25 DIAGNOSIS — E119 Type 2 diabetes mellitus without complications: Secondary | ICD-10-CM | POA: Insufficient documentation

## 2020-02-25 DIAGNOSIS — Z90710 Acquired absence of both cervix and uterus: Secondary | ICD-10-CM | POA: Insufficient documentation

## 2020-02-25 DIAGNOSIS — Z452 Encounter for adjustment and management of vascular access device: Secondary | ICD-10-CM | POA: Diagnosis not present

## 2020-02-25 DIAGNOSIS — Z7984 Long term (current) use of oral hypoglycemic drugs: Secondary | ICD-10-CM | POA: Diagnosis not present

## 2020-02-25 DIAGNOSIS — C259 Malignant neoplasm of pancreas, unspecified: Secondary | ICD-10-CM | POA: Diagnosis not present

## 2020-02-25 HISTORY — PX: IR IMAGING GUIDED PORT INSERTION: IMG5740

## 2020-02-25 LAB — GLUCOSE, CAPILLARY: Glucose-Capillary: 140 mg/dL — ABNORMAL HIGH (ref 70–99)

## 2020-02-25 MED ORDER — FENTANYL CITRATE (PF) 100 MCG/2ML IJ SOLN
INTRAMUSCULAR | Status: AC
  Filled 2020-02-25: qty 2

## 2020-02-25 MED ORDER — HEPARIN SOD (PORK) LOCK FLUSH 100 UNIT/ML IV SOLN
INTRAVENOUS | Status: AC
  Filled 2020-02-25: qty 5

## 2020-02-25 MED ORDER — MIDAZOLAM HCL 2 MG/2ML IJ SOLN
INTRAMUSCULAR | Status: AC | PRN
  Administered 2020-02-25 (×2): 1 mg via INTRAVENOUS

## 2020-02-25 MED ORDER — LIDOCAINE HCL 1 % IJ SOLN
INTRAMUSCULAR | Status: AC
  Filled 2020-02-25: qty 20

## 2020-02-25 MED ORDER — MIDAZOLAM HCL 2 MG/2ML IJ SOLN
INTRAMUSCULAR | Status: AC
  Filled 2020-02-25: qty 4

## 2020-02-25 MED ORDER — FENTANYL CITRATE (PF) 100 MCG/2ML IJ SOLN
INTRAMUSCULAR | Status: AC | PRN
  Administered 2020-02-25 (×2): 50 ug via INTRAVENOUS

## 2020-02-25 MED ORDER — HEPARIN SOD (PORK) LOCK FLUSH 100 UNIT/ML IV SOLN
INTRAVENOUS | Status: AC | PRN
  Administered 2020-02-25: 500 [IU] via INTRAVENOUS

## 2020-02-25 MED ORDER — CEFAZOLIN SODIUM-DEXTROSE 2-4 GM/100ML-% IV SOLN: 2.0000 g | INTRAVENOUS | Status: AC

## 2020-02-25 MED ORDER — SODIUM CHLORIDE 0.9 % IV SOLN: INTRAVENOUS | Status: DC

## 2020-02-25 MED ORDER — LIDOCAINE HCL (PF) 1 % IJ SOLN
INTRAMUSCULAR | Status: AC | PRN
  Administered 2020-02-25: 5 mL

## 2020-02-25 MED ORDER — CEFAZOLIN SODIUM-DEXTROSE 2-4 GM/100ML-% IV SOLN
INTRAVENOUS | Status: AC
  Administered 2020-02-25: 2 g via INTRAVENOUS
  Filled 2020-02-25: qty 100

## 2020-02-25 MED ORDER — LIDOCAINE-EPINEPHRINE 1 %-1:100000 IJ SOLN
INTRAMUSCULAR | Status: AC
  Filled 2020-02-25: qty 1

## 2020-02-25 NOTE — Discharge Instructions (Signed)
You may remove your dressing in 24 hours and shower at this time. For problems and concerns, please call Interventional Radiology after hours at [phone].  Implanted Port Insertion, Care After This sheet gives you information about how to care for yourself after your procedure. Your health care provider may also give you more specific instructions. If you have problems or questions, contact your health care provider. What can I expect after the procedure? After the procedure, it is common to have:  Discomfort at the port insertion site.  Bruising on the skin over the port. This should improve over 3-4 days. Follow these instructions at home: [facility] care  After your port is placed, you will get a manufacturer's information card. The card has information about your port. Keep this card with you at all times.  Take care of the port as told by your health care provider. Ask your health care provider if you or a family member can get training for taking care of the port at home. A home health care nurse may also take care of the port.  Make sure to remember what type of port you have. Incision care  Follow instructions from your health care provider about how to take care of your port insertion site. Make sure you: ? Wash your hands with soap and water before and after you change your bandage (dressing). If soap and water are not available, use hand sanitizer. ? Change your dressing as told by your health care provider. ? Leave stitches (sutures), skin glue, or adhesive strips in place. These skin closures may need to stay in place for 2 weeks or longer. If adhesive strip edges start to loosen and curl up, you may trim the loose edges. Do not remove adhesive strips completely unless your health care provider tells you to do that.  Check your port insertion site every day for signs of infection. Check for: ? Redness, swelling, or pain. ? Fluid or blood. ? Warmth. ? Pus or a bad smell.       Activity  Return to your normal activities as told by your health care provider. Ask your health care provider what activities are safe for you.  Do not lift anything that is heavier than 10 lb (4.5 kg), or the limit that you are told, until your health care provider says that it is safe. General instructions  Take over-the-counter and prescription medicines only as told by your health care provider.  Do not take baths, swim, or use a hot tub until your health care provider approves. Ask your health care provider if you may take showers. You may only be allowed to take sponge baths.  Do not drive for 24 hours if you were given a sedative during your procedure.  Wear a medical alert bracelet in case of an emergency. This will tell any health care providers that you have a port.  Keep all follow-up visits as told by your health care provider. This is important. Contact a health care provider if:  You cannot flush your port with saline as directed, or you cannot draw blood from the port.  You have a fever or chills.  You have redness, swelling, or pain around your port insertion site.  You have fluid or blood coming from your port insertion site.  Your port insertion site feels warm to the touch.  You have pus or a bad smell coming from the port insertion site. Get help right away if:  You have chest pain  or shortness of breath.  You have bleeding from your port that you cannot control. Summary  Take care of the port as told by your health care provider. Keep the manufacturer's information card with you at all times.  Change your dressing as told by your health care provider.  Contact a health care provider if you have a fever or chills or if you have redness, swelling, or pain around your port insertion site.  Keep all follow-up visits as told by your health care provider. This information is not intended to replace advice given to you by your health care provider. Make  sure you discuss any questions you have with your health care provider. Document Revised: 07/31/2017 Document Reviewed: 07/31/2017 Elsevier Patient Education  2021 [place].   Moderate Conscious Sedation, Adult, Care After This sheet gives you information about how to care for yourself after your procedure. Your health care provider may also give you more specific instructions. If you have problems or questions, contact your health care provider. What can I expect after the procedure? After the procedure, it is common to have:  Sleepiness for several hours.  Impaired judgment for several hours.  Difficulty with balance.  Vomiting if you eat too soon. Follow these instructions at home: For the time period you were told by your health care provider:  Rest.  Do not participate in activities where you could fall or become injured.  Do not drive or use machinery.  Do not drink alcohol.  Do not take sleeping pills or medicines that cause drowsiness.  Do not make important decisions or sign legal documents.  Do not take care of children on your own.      Eating and drinking  Follow the diet recommended by your health care provider.  Drink enough fluid to keep your urine pale yellow.  If you vomit: ? Drink water, juice, or soup when you can drink without vomiting. ? Make sure you have little or no nausea before eating solid foods.   General instructions  Take over-the-counter and prescription medicines only as told by your health care provider.  Have a responsible adult stay with you for the time you are told. It is important to have someone help care for you until you are awake and alert.  Do not smoke.  Keep all follow-up visits as told by your health care provider. This is important. Contact a health care provider if:  You are still sleepy or having trouble with balance after 24 hours.  You feel light-headed.  You keep feeling nauseous or you keep  vomiting.  You develop a rash.  You have a fever.  You have redness or swelling around the IV site. Get help right away if:  You have trouble breathing.  You have new-onset confusion at home. Summary  After the procedure, it is common to feel sleepy, have impaired judgment, or feel nauseous if you eat too soon.  Rest after you get home. Know the things you should not do after the procedure.  Follow the diet recommended by your health care provider and drink enough fluid to keep your urine pale yellow.  Get help right away if you have trouble breathing or new-onset confusion at home. This information is not intended to replace advice given to you by your health care provider. Make sure you discuss any questions you have with your health care provider. Document Revised: 05/02/2019 Document Reviewed: 11/28/2018 Elsevier Patient Education  2021 [employer].

## 2020-02-25 NOTE — H&P (Signed)
Referring Physician(s): Feng,Yan  Supervising Physician: Markus Daft  Patient Status:  [facility]  Chief Complaint:  "I'm here for a port a cath"  Subjective: Pt familiar to IR service  from liver lesion biopsy on 01/28/20. She has a hx of metastatic pancreatic cancer and presents again today for port a cath placement for chemotherapy. She denies fever,HA, CP, dyspnea, cough, back pain, N/V or bleeding. She does occ abdominal pain and vertigo. Additional med hx as below.   Past Medical History:  Diagnosis Date  . Acute medial meniscus tear    right knee  . Anxiety   . Asthma   . Cervical spondylosis   . Chronic serous otitis media   . Constipation   . COPD (chronic obstructive pulmonary disease) ([place])   . Depression    followed by [name]; no longer of depakote, seroquel  . Diabetes mellitus without complication ([place])   . Dysuria 12/27/2009   Qualifier: Diagnosis of  By: [name] MD, Amanjot    . Foot drop   . GERD (gastroesophageal reflux disease)   . Heart murmur   . Hyperlipidemia   . Hypertension   . Low back pain   . Obesity   . Paraumbilical hernia   . Pre-diabetes    Past Surgical History:  Procedure Laterality Date  . ABDOMINAL HYSTERECTOMY    . ANTERIOR CERVICAL DECOMP/DISCECTOMY FUSION N/A 10/05/2014   Procedure: ACDF - C5-C6 - C6-C7;  Surgeon: Karie Chimera, MD;  Location: [facility];  Service: Neurosurgery;  Laterality: N/A;  ACDF - C5-C6 - C6-C7  . CHOLECYSTECTOMY    . COLONOSCOPY    . FOOT SURGERY    . HERNIA REPAIR  1999  . IR US GUIDANCE  01/28/2020  . KNEE ARTHROSCOPY WITH MEDIAL MENISECTOMY Right 06/14/2016   Procedure: RIGHT KNEE ARTHROSCOPY WITH PARTIAL MEDIAL MENISCECTOMY, SUBCHONDROPLASTY;  Surgeon: Leandrew Koyanagi, MD;  Location: [facility];  Service: Orthopedics;  Laterality: Right;  . KNEE ARTHROSCOPY WITH SUBCHONDROPLASTY Right 06/14/2016   Procedure: KNEE ARTHROSCOPY WITH SUBCHONDROPLASTY;  Surgeon: Leandrew Koyanagi, MD;   Location: [facility];  Service: Orthopedics;  Laterality: Right;  . RADIOACTIVE SEED GUIDED EXCISIONAL BREAST BIOPSY Left 06/23/2014   Procedure: RADIOACTIVE SEED GUIDED EXCISIONAL BREAST BIOPSY;  Surgeon: Stark Klein, MD;  Location: [facility];  Service: General;  Laterality: Left;        Allergies: Patient has no known allergies.  Medications: Prior to Admission medications   Medication Sig Start Date End Date Taking? Authorizing Provider  albuterol (PROVENTIL) (2.5 MG/3ML) 0.083% nebulizer solution Take 3 mLs (2.5 mg total) by nebulization every 6 (six) hours as needed for wheezing. 02/22/18   Velna Ochs, MD  albuterol (VENTOLIN HFA) 108 (90 Base) MCG/ACT inhaler INHALE 1 TO 2 PUFFS INTO THE LUNGS EVERY 6 HOURS AS NEEDED FOR WHEEZING OR SHORTNESS OF BREATH 03/11/19   Sid Falcon, MD  alendronate (FOSAMAX) 70 MG tablet TAKE 1 TABLET BY MOUTH EVERY 7 DAYS. TAKE WITH A FULL GLASS OF WATER ON AN EMPTY STOMACH. 01/14/20   Sid Falcon, MD  atorvastatin (LIPITOR) 40 MG tablet Take 1 tablet (40 mg total) by mouth daily. 01/14/20   Sid Falcon, MD  benazepril (LOTENSIN) 20 MG tablet TAKE 1 TABLET(20 MG) BY MOUTH DAILY 01/14/20   Gilles Chiquito B, MD  calcium carbonate (OS-CAL) 600 MG TABS tablet Take by mouth.    [provider]  celecoxib (CELEBREX) 100 MG capsule  TAKE 1 CAPSULE(100 MG) BY MOUTH TWICE DAILY 01/14/20   Sid Falcon, MD  cholecalciferol (VITAMIN D) 1000 units tablet Take 2,000 Units by mouth daily.    [provider]  diclofenac Sodium (VOLTAREN) 1 % GEL Apply 2 g topically 4 (four) times daily. 09/05/19   Sid Falcon, MD  famotidine (PEPCID) 40 MG tablet TAKE 1/2 TABLET(20 MG) BY MOUTH TWICE DAILY 01/14/20   Sid Falcon, MD  fluticasone [facility]) 50 MCG/ACT nasal spray Place 1 spray into both nostrils daily. 01/14/20   Sid Falcon, MD  Fluticasone-Salmeterol (ADVAIR DISKUS) 100-50 MCG/DOSE AEPB Inhale 1  puff into the lungs 2 (two) times daily. 01/14/20 01/13/21  Sid Falcon, MD  furosemide (LASIX) 20 MG tablet take 1 tablet by mouth once daily 12/27/15   Bartholomew Crews, MD  glipiZIDE (GLUCOTROL XL) 2.5 MG 24 hr tablet Take 1 tablet (2.5 mg total) by mouth daily with breakfast. 01/30/20   Sid Falcon, MD  hydrochlorothiazide (MICROZIDE) 12.5 MG capsule TAKE 1 CAPSULE(12.5 MG) BY MOUTH DAILY 01/14/20   Sid Falcon, MD  HYDROcodone-acetaminophen (NORCO/VICODIN) 5-325 MG tablet Take 1 tablet by mouth 2 (two) times daily as needed for severe pain. 01/14/20   Sid Falcon, MD  ipratropium (ATROVENT) 0.02 % nebulizer solution Take 0.5 mg by nebulization every 6 (six) hours as needed for wheezing or shortness of breath.    [provider]  latanoprost (XALATAN) 0.005 % ophthalmic solution PLACE 1 DROP INTO BOTH EYES AT BEDTIME 09/28/17   [provider]  lidocaine-prilocaine (EMLA) cream Apply to affected area once 02/11/20   Truitt Merle, MD  loperamide (LOPERAMIDE A-D) 2 MG tablet Take 1 tablet (2 mg total) by mouth 2 (two) times daily as needed for diarrhea or loose stools. 08/31/17   Santos-Sanchez, Merlene Morse, MD  loratadine (CLARITIN) 10 MG tablet Take 1 tablet (10 mg total) by mouth daily. 01/14/20   Sid Falcon, MD  meclizine (ANTIVERT) 12.5 MG tablet TAKE 1 TABLET(12.5 MG) BY MOUTH TWICE DAILY AS NEEDED FOR DIZZINESS 11/13/19   Iona Beard, MD  ondansetron [facility]) 4 MG tablet Take 1-2 tablets (4-8 mg total) by mouth every 8 (eight) hours as needed for nausea or vomiting. 08/31/17   Welford Roche, MD  ondansetron (ZOFRAN) 8 MG tablet Take 1 tablet (8 mg total) by mouth 2 (two) times daily as needed (Nausea or vomiting). 02/11/20   Truitt Merle, MD  [facility] ULTRA test strip USE TO TEST BLOOD SUGAR  ONCE DAILY 01/21/20   Sid Falcon, MD  prochlorperazine (COMPAZINE) 10 MG tablet Take 1 tablet (10 mg total) by mouth every 6 (six) hours as needed (Nausea or  vomiting). 02/11/20   Truitt Merle, MD  risperiDONE (RISPERDAL) 0.25 MG tablet Take 0.25 mg by mouth daily as needed. 08/08/17   [provider]  sitaGLIPtin (JANUVIA) 25 MG tablet Take 1 tablet (25 mg total) by mouth daily. 01/14/20   Sid Falcon, MD  traZODone (DESYREL) 50 MG tablet Take 25-50 mg by mouth at bedtime as needed for sleep.     [provider]  triamcinolone cream (KENALOG) 0.1 % apply to affected area twice a day 01/22/17   Sid Falcon, MD     Vital Signs: LMP 04/29/1966 (LMP Unknown)   Physical Exam awake/alert; chest- CTA bilat; heart- RRR, murmur noted, abd- soft,+BS, tender epigastric region; no LE edema  Imaging: No results found.  Labs:  CBC: Recent Labs  11/13/19 1617 01/28/20 1150 02/19/20 1157  WBC 5.6 6.8 8.7  HGB 12.8 13.7 13.7  HCT 39.4 43.1 42.9  PLT 279 242 251    COAGS: Recent Labs    01/28/20 1150  INR 0.9    BMP: Recent Labs    09/05/19 1041 11/13/19 1617 12/26/19 1124 01/28/20 1150 02/19/20 1157  NA 141 141 139 138 138  K 4.6 4.2 4.4 3.7 3.6  CL 101 101 96 95* 97*  CO2 27 28 29 28  32  GLUCOSE 170* 92 138* 162* 248*  BUN 11 10 13 13 11   CALCIUM 9.7 9.6 9.8 10.0 9.7  CREATININE 0.80 0.74 0.78 0.76 0.89  GFRNONAA 74 82 77 >60 >60  GFRAA 86 94 88  --   --     LIVER FUNCTION TESTS: Recent Labs    11/13/19 1617 12/26/19 1124 01/28/20 1150 02/19/20 1157  BILITOT 0.3 0.3 1.0 1.1  AST 30 94* 88* 95*  ALT 25 86* 83* 85*  ALKPHOS 122* 210* 306* 471*  PROT 6.4 6.7 8.0 7.6  ALBUMIN 4.0 4.0 3.9 3.3*    Assessment and Plan: Pt familiar to IR service  from liver lesion biopsy on 01/28/20. She has a hx of metastatic pancreatic cancer and presents again today for port a cath placement for chemotherapy.Risks and benefits of image guided port-a-catheter placement was discussed with the patient including, but not limited to bleeding, infection, pneumothorax, or fibrin sheath development and need for additional  procedures.  All of the patient's questions were answered, patient is agreeable to proceed. Consent signed and in chart.     Electronically Signed: D. Rowe Robert, PA-C 02/25/2020, 12:31 PM   I spent a total of 25 minutes at the the patient's bedside AND on the patient's hospital floor or unit, greater than 50% of which was counseling/coordinating care for port a cath placement

## 2020-02-25 NOTE — Telephone Encounter (Signed)
Mariah Henderson called again requesting pain medication for after port placement.

## 2020-02-25 NOTE — Procedures (Signed)
Interventional Radiology Procedure:   Indications: Metastatic pancreatic cancer  Procedure: Port placement  Findings: Right jugular port, tip at SVC/RA junction  Complications: None     EBL: Minimal, less than 10 ml  Plan: Discharge in one hour.  Keep port site and incisions dry for at least 24 hours.     Alecsander Hattabaugh R. Anselm Pancoast, MD  Pager: [phone]

## 2020-02-25 NOTE — Telephone Encounter (Signed)
Called pt since scheduler had asked to call her regarding her schedule & chemo.  She had her port placed today & was at home.  She asked about pain med & should she have some for port & her back pain.  Instructed as Dr Ernestina Penna nurse did to take ibuprofen as needed but after treatment to take tylenol only & if this is not helping her pain to let us know.  She states she no longer has Vicodin at home. This was given by another MD. She expressed understanding.

## 2020-02-26 ENCOUNTER — Other Ambulatory Visit: Payer: Self-pay | Admitting: Hematology

## 2020-02-26 ENCOUNTER — Encounter: Payer: Self-pay | Admitting: Hematology

## 2020-02-26 ENCOUNTER — Encounter: Payer: Medicare Other | Admitting: Genetic Counselor

## 2020-02-26 ENCOUNTER — Inpatient Hospital Stay: Payer: Medicare Other

## 2020-02-26 ENCOUNTER — Ambulatory Visit: Payer: Medicare Other | Admitting: Hematology

## 2020-02-26 ENCOUNTER — Other Ambulatory Visit: Payer: Self-pay

## 2020-02-26 ENCOUNTER — Inpatient Hospital Stay (HOSPITAL_BASED_OUTPATIENT_CLINIC_OR_DEPARTMENT_OTHER): Payer: Medicare Other | Admitting: Hematology

## 2020-02-26 VITALS — BP 171/83 | HR 89 | Temp 98.4°F | Ht 60.0 in | Wt 141.4 lb

## 2020-02-26 DIAGNOSIS — C252 Malignant neoplasm of tail of pancreas: Secondary | ICD-10-CM | POA: Diagnosis not present

## 2020-02-26 DIAGNOSIS — Z5111 Encounter for antineoplastic chemotherapy: Secondary | ICD-10-CM | POA: Diagnosis not present

## 2020-02-26 DIAGNOSIS — Z7189 Other specified counseling: Secondary | ICD-10-CM

## 2020-02-26 DIAGNOSIS — Z95828 Presence of other vascular implants and grafts: Secondary | ICD-10-CM

## 2020-02-26 DIAGNOSIS — C787 Secondary malignant neoplasm of liver and intrahepatic bile duct: Secondary | ICD-10-CM | POA: Diagnosis not present

## 2020-02-26 LAB — CMP (CANCER CENTER ONLY)
ALT: 78 U/L — ABNORMAL HIGH (ref 0–44)
AST: 91 U/L — ABNORMAL HIGH (ref 15–41)
Albumin: 3.1 g/dL — ABNORMAL LOW (ref 3.5–5.0)
Alkaline Phosphatase: 505 U/L — ABNORMAL HIGH (ref 38–126)
Anion gap: 8 (ref 5–15)
BUN: 9 mg/dL (ref 8–23)
CO2: 29 mmol/L (ref 22–32)
Calcium: 9.1 mg/dL (ref 8.9–10.3)
Chloride: 101 mmol/L (ref 98–111)
Creatinine: 0.81 mg/dL (ref 0.44–1.00)
GFR, Estimated: >60 mL/min (ref >60)
Glucose, Bld: 250 mg/dL — ABNORMAL HIGH (ref 70–99)
Potassium: 3.7 mmol/L (ref 3.5–5.1)
Sodium: 138 mmol/L (ref 135–145)
Total Bilirubin: 1.2 mg/dL (ref 0.3–1.2)
Total Protein: 6.9 g/dL (ref 6.5–8.1)

## 2020-02-26 LAB — CBC WITH DIFFERENTIAL (CANCER CENTER ONLY)
Abs Immature Granulocytes: 0.02 10*3/uL (ref 0.00–0.07)
Basophils Absolute: 0 10*3/uL (ref 0.0–0.1)
Basophils Relative: 1 %
Eosinophils Absolute: 0.2 10*3/uL (ref 0.0–0.5)
Eosinophils Relative: 2 %
HCT: 38.6 % (ref 36.0–46.0)
Hemoglobin: 12.5 g/dL (ref 12.0–15.0)
Immature Granulocytes: 0 %
Lymphocytes Relative: 5 %
Lymphs Abs: 0.4 10*3/uL — ABNORMAL LOW (ref 0.7–4.0)
MCH: 28.2 pg (ref 26.0–34.0)
MCHC: 32.4 g/dL (ref 30.0–36.0)
MCV: 86.9 fL (ref 80.0–100.0)
Monocytes Absolute: 0.8 10*3/uL (ref 0.1–1.0)
Monocytes Relative: 9 %
Neutro Abs: 7.1 10*3/uL (ref 1.7–7.7)
Neutrophils Relative %: 83 %
Platelet Count: 207 10*3/uL (ref 150–400)
RBC: 4.44 MIL/uL (ref 3.87–5.11)
RDW: 14.2 % (ref 11.5–15.5)
WBC Count: 8.5 10*3/uL (ref 4.0–10.5)
nRBC: 0 % (ref 0.0–0.2)

## 2020-02-26 MED ORDER — SODIUM CHLORIDE 0.9 % IV SOLN
1000.0000 mg/m2 | Freq: Once | INTRAVENOUS | Status: AC
  Administered 2020-02-26: 1672 mg via INTRAVENOUS
  Filled 2020-02-26: qty 43.97

## 2020-02-26 MED ORDER — PROCHLORPERAZINE MALEATE 10 MG PO TABS
ORAL_TABLET | ORAL | Status: AC
  Filled 2020-02-26: qty 1

## 2020-02-26 MED ORDER — PROCHLORPERAZINE MALEATE 10 MG PO TABS
10.0000 mg | ORAL_TABLET | Freq: Once | ORAL | Status: AC
  Administered 2020-02-26: 10 mg via ORAL

## 2020-02-26 MED ORDER — SODIUM CHLORIDE 0.9% FLUSH
10.0000 mL | INTRAVENOUS | Status: DC | PRN
Start: 2020-02-26 — End: 2020-02-26
  Administered 2020-02-26: 3 mL
  Filled 2020-02-26: qty 10

## 2020-02-26 MED ORDER — SODIUM CHLORIDE 0.9 % IV SOLN
Freq: Once | INTRAVENOUS | Status: AC
  Filled 2020-02-26: qty 250

## 2020-02-26 MED ORDER — ACETAMINOPHEN 325 MG PO TABS
650.0000 mg | ORAL_TABLET | Freq: Once | ORAL | Status: AC
  Administered 2020-02-26: 650 mg via ORAL

## 2020-02-26 MED ORDER — PACLITAXEL PROTEIN-BOUND CHEMO INJECTION 100 MG
100.0000 mg/m2 | Freq: Once | INTRAVENOUS | Status: AC
  Administered 2020-02-26: 175 mg via INTRAVENOUS
  Filled 2020-02-26: qty 35

## 2020-02-26 MED ORDER — ACETAMINOPHEN 325 MG PO TABS
ORAL_TABLET | ORAL | Status: AC
  Filled 2020-02-26: qty 2

## 2020-02-26 MED ORDER — HEPARIN SOD (PORK) LOCK FLUSH 100 UNIT/ML IV SOLN
500.0000 [IU] | Freq: Once | INTRAVENOUS | Status: AC | PRN
  Administered 2020-02-26: 500 [IU]
  Filled 2020-02-26: qty 5

## 2020-02-26 MED ORDER — SODIUM CHLORIDE 0.9% FLUSH
10.0000 mL | Freq: Once | INTRAVENOUS | Status: AC
  Administered 2020-02-26: 10 mL
  Filled 2020-02-26: qty 10

## 2020-02-26 NOTE — Patient Instructions (Signed)
Cancer Center Discharge Instructions for Patients Receiving Chemotherapy  Today you received the following chemotherapy agents Abraxane; Gemzar  To help prevent nausea and vomiting after your treatment, we encourage you to take your nausea medication as directed   If you develop nausea and vomiting that is not controlled by your nausea medication, call the clinic.   BELOW ARE SYMPTOMS THAT SHOULD BE REPORTED IMMEDIATELY:  *FEVER GREATER THAN 100.5 F  *CHILLS WITH OR WITHOUT FEVER  NAUSEA AND VOMITING THAT IS NOT CONTROLLED WITH YOUR NAUSEA MEDICATION  *UNUSUAL SHORTNESS OF BREATH  *UNUSUAL BRUISING OR BLEEDING  TENDERNESS IN MOUTH AND THROAT WITH OR WITHOUT PRESENCE OF ULCERS  *URINARY PROBLEMS  *BOWEL PROBLEMS  UNUSUAL RASH Items with * indicate a potential emergency and should be followed up as soon as possible.  Feel free to call the clinic should you have any questions or concerns. The clinic phone number is [phone].  Please show the CHEMO ALERT CARD at check-in to the Emergency Department and triage nurse.  Nanoparticle Albumin-Bound Paclitaxel injection What is this medicine? NANOPARTICLE ALBUMIN-BOUND PACLITAXEL (Na no PAHR ti kuhl al BYOO muhn-bound PAK li TAX el) is a chemotherapy drug. It targets fast dividing cells, like cancer cells, and causes these cells to die. This medicine is used to treat advanced breast cancer, lung cancer, and pancreatic cancer. This medicine may be used for other purposes; ask your health care provider or pharmacist if you have questions. COMMON BRAND NAME(S): Abraxane What should I tell my health care provider before I take this medicine? They need to know if you have any of these conditions:  kidney disease  liver disease  low blood counts, like low white cell, platelet, or red cell counts  lung or breathing disease, like asthma  tingling of the fingers or toes, or other nerve disorder  an unusual or  allergic reaction to paclitaxel, albumin, other chemotherapy, other medicines, foods, dyes, or preservatives  pregnant or trying to get pregnant  breast-feeding How should I use this medicine? This drug is given as an infusion into a vein. It is administered in a hospital or clinic by a specially trained health care professional. Talk to your pediatrician regarding the use of this medicine in children. Special care may be needed. Overdosage: If you think you have taken too much of this medicine contact a poison control center or emergency room at once. NOTE: This medicine is only for you. Do not share this medicine with others. What if I miss a dose? It is important not to miss your dose. Call your doctor or health care professional if you are unable to keep an appointment. What may interact with this medicine? This medicine may interact with the following medications:  antiviral medicines for hepatitis, HIV or AIDS  certain antibiotics like erythromycin and clarithromycin  certain medicines for fungal infections like ketoconazole and itraconazole  certain medicines for seizures like carbamazepine, phenobarbital, phenytoin  gemfibrozil  nefazodone  rifampin  St. John's wort This list may not describe all possible interactions. Give your health care provider a list of all the medicines, herbs, non-prescription drugs, or dietary supplements you use. Also tell them if you smoke, drink alcohol, or use illegal drugs. Some items may interact with your medicine. What should I watch for while using this medicine? Your condition will be monitored carefully while you are receiving this medicine. You will need important blood work done while you are taking this medicine. This medicine can cause serious   allergic reactions. If you experience allergic reactions like skin rash, itching or hives, swelling of the face, lips, or tongue, tell your doctor or health care professional right away. In some  cases, you may be given additional medicines to help with side effects. Follow all directions for their use. This drug may make you feel generally unwell. This is not uncommon, as chemotherapy can affect healthy cells as well as cancer cells. Report any side effects. Continue your course of treatment even though you feel ill unless your doctor tells you to stop. Call your doctor or health care professional for advice if you get a fever, chills or sore throat, or other symptoms of a cold or flu. Do not treat yourself. This drug decreases your body's ability to fight infections. Try to avoid being around people who are sick. This medicine may increase your risk to bruise or bleed. Call your doctor or health care professional if you notice any unusual bleeding. Be careful brushing and flossing your teeth or using a toothpick because you may get an infection or bleed more easily. If you have any dental work done, tell your dentist you are receiving this medicine. Avoid taking products that contain aspirin, acetaminophen, ibuprofen, naproxen, or ketoprofen unless instructed by your doctor. These medicines may hide a fever. Do not become pregnant while taking this medicine or for 6 months after stopping it. Women should inform their doctor if they wish to become pregnant or think they might be pregnant. Men should not father a child while taking this medicine or for 3 months after stopping it. There is a potential for serious side effects to an unborn child. Talk to your health care professional or pharmacist for more information. Do not breast-feed an infant while taking this medicine or for 2 weeks after stopping it. This medicine may interfere with the ability to get pregnant or to father a child. You should talk to your doctor or health care professional if you are concerned about your fertility. What side effects may I notice from receiving this medicine? Side effects that you should report to your doctor  or health care professional as soon as possible:  allergic reactions like skin rash, itching or hives, swelling of the face, lips, or tongue  breathing problems  changes in vision  fast, irregular heartbeat  low blood pressure  mouth sores  pain, tingling, numbness in the hands or feet  signs of decreased platelets or bleeding - bruising, pinpoint red spots on the skin, black, tarry stools, blood in the urine  signs of decreased red blood cells - unusually weak or tired, feeling faint or lightheaded, falls  signs of infection - fever or chills, cough, sore throat, pain or difficulty passing urine  signs and symptoms of liver injury like dark yellow or brown urine; general ill feeling or flu-like symptoms; light-colored stools; loss of appetite; nausea; right upper belly pain; unusually weak or tired; yellowing of the eyes or skin  swelling of the ankles, feet, hands  unusually slow heartbeat Side effects that usually do not require medical attention (report to your doctor or health care professional if they continue or are bothersome):  diarrhea  hair loss  loss of appetite  nausea, vomiting  tiredness This list may not describe all possible side effects. Call your doctor for medical advice about side effects. You may report side effects to FDA at 1-800-FDA-1088. Where should I keep my medicine? This drug is given in a hospital or clinic and will  not be stored at home. NOTE: This sheet is a summary. It may not cover all possible information. If you have questions about this medicine, talk to your doctor, pharmacist, or health care provider.  2021 Elsevier/Gold Standard (2016-09-05 13:03:45)  Gemcitabine injection What is this medicine? GEMCITABINE (jem SYE ta been) is a chemotherapy drug. This medicine is used to treat many types of cancer like breast cancer, lung cancer, pancreatic cancer, and ovarian cancer. This medicine may be used for other purposes; ask your  health care provider or pharmacist if you have questions. COMMON BRAND NAME(S): Gemzar, Infugem What should I tell my health care provider before I take this medicine? They need to know if you have any of these conditions:  blood disorders  infection  kidney disease  liver disease  lung or breathing disease, like asthma  recent or ongoing radiation therapy  an unusual or allergic reaction to gemcitabine, other chemotherapy, other medicines, foods, dyes, or preservatives  pregnant or trying to get pregnant  breast-feeding How should I use this medicine? This drug is given as an infusion into a vein. It is administered in a hospital or clinic by a specially trained health care professional. Talk to your pediatrician regarding the use of this medicine in children. Special care may be needed. Overdosage: If you think you have taken too much of this medicine contact a poison control center or emergency room at once. NOTE: This medicine is only for you. Do not share this medicine with others. What if I miss a dose? It is important not to miss your dose. Call your doctor or health care professional if you are unable to keep an appointment. What may interact with this medicine?  medicines to increase blood counts like filgrastim, pegfilgrastim, sargramostim  some other chemotherapy drugs like cisplatin  vaccines Talk to your doctor or health care professional before taking any of these medicines:  acetaminophen  aspirin  ibuprofen  ketoprofen  naproxen This list may not describe all possible interactions. Give your health care provider a list of all the medicines, herbs, non-prescription drugs, or dietary supplements you use. Also tell them if you smoke, drink alcohol, or use illegal drugs. Some items may interact with your medicine. What should I watch for while using this medicine? Visit your doctor for checks on your progress. This drug may make you feel generally unwell.  This is not uncommon, as chemotherapy can affect healthy cells as well as cancer cells. Report any side effects. Continue your course of treatment even though you feel ill unless your doctor tells you to stop. In some cases, you may be given additional medicines to help with side effects. Follow all directions for their use. Call your doctor or health care professional for advice if you get a fever, chills or sore throat, or other symptoms of a cold or flu. Do not treat yourself. This drug decreases your body's ability to fight infections. Try to avoid being around people who are sick. This medicine may increase your risk to bruise or bleed. Call your doctor or health care professional if you notice any unusual bleeding. Be careful brushing and flossing your teeth or using a toothpick because you may get an infection or bleed more easily. If you have any dental work done, tell your dentist you are receiving this medicine. Avoid taking products that contain aspirin, acetaminophen, ibuprofen, naproxen, or ketoprofen unless instructed by your doctor. These medicines may hide a fever. Do not become pregnant while taking this  medicine or for 6 months after stopping it. Women should inform their doctor if they wish to become pregnant or think they might be pregnant. Men should not father a child while taking this medicine and for 3 months after stopping it. There is a potential for serious side effects to an unborn child. Talk to your health care professional or pharmacist for more information. Do not breast-feed an infant while taking this medicine or for at least 1 week after stopping it. Men should inform their doctors if they wish to father a child. This medicine may lower sperm counts. Talk with your doctor or health care professional if you are concerned about your fertility. What side effects may I notice from receiving this medicine? Side effects that you should report to your doctor or health care  professional as soon as possible:  allergic reactions like skin rash, itching or hives, swelling of the face, lips, or tongue  breathing problems  pain, redness, or irritation at site where injected  signs and symptoms of a dangerous change in heartbeat or heart rhythm like chest pain; dizziness; fast or irregular heartbeat; palpitations; feeling faint or lightheaded, falls; breathing problems  signs of decreased platelets or bleeding - bruising, pinpoint red spots on the skin, black, tarry stools, blood in the urine  signs of decreased red blood cells - unusually weak or tired, feeling faint or lightheaded, falls  signs of infection - fever or chills, cough, sore throat, pain or difficulty passing urine  signs and symptoms of kidney injury like trouble passing urine or change in the amount of urine  signs and symptoms of liver injury like dark yellow or brown urine; general ill feeling or flu-like symptoms; light-colored stools; loss of appetite; nausea; right upper belly pain; unusually weak or tired; yellowing of the eyes or skin  swelling of ankles, feet, hands Side effects that usually do not require medical attention (report to your doctor or health care professional if they continue or are bothersome):  constipation  diarrhea  hair loss  loss of appetite  nausea  rash  vomiting This list may not describe all possible side effects. Call your doctor for medical advice about side effects. You may report side effects to FDA at 1-800-FDA-1088. Where should I keep my medicine? This drug is given in a hospital or clinic and will not be stored at home. NOTE: This sheet is a summary. It may not cover all possible information. If you have questions about this medicine, talk to your doctor, pharmacist, or health care provider.  2021 Elsevier/Gold Standard (2017-03-28 18:06:11)

## 2020-02-26 NOTE — Progress Notes (Signed)
[place]   [phone] Fax:(336) [phone]   Clinic Follow up Note   Patient Care Team: Sid Falcon, MD as PCP - General (Internal Medicine) Forrest Moron, [place] (Optometry) Jonnie Finner, RN as Oncology Nurse Navigator Truitt Merle, MD as Consulting Physician (Oncology) Gwyndolyn Kaufman, RN as Registered Nurse 02/26/2020  CHIEF COMPLAINT: f/u and first cycle chemo   SUMMARY OF ONCOLOGIC HISTORY: Oncology History Overview Note  Cancer Staging Pancreatic cancer [facility]) Staging form: Exocrine Pancreas, AJCC 8th Edition - Clinical: Stage IV (cT1c, cN0, pM1) - Signed by Truitt Merle, MD on 02/26/2020 Total positive nodes: 0    Pancreatic cancer ([place])  01/13/2020 Imaging   US Abdomen 01/13/20  IMPRESSION: 1. Suggestion of 3 vascular hypoechoic mass within the liver measuring up to 2.5 cm. 2. Suggestion of a hypoechoic mass within the tail of the pancreas that is not well visualized. 3. Findings concerning for malignancy, please see separately dictated CT abdomen pelvis 01/13/2020.   01/13/2020 Imaging   CT AP 01/13/20  IMPRESSION: 1. Hypoenhancing 2.0 cm in long axis mass in the pancreatic tail compatible with pancreatic adenocarcinoma. The mass abuts the posterior gastric wall but without definite gastric invasion. The splenic vein appears narrowed/attenuated compared to prior exams and the lesion abuts the margin of the splenic artery. 2. Hypoenhancing hepatic masses are new compared to 11/22/2017 and are highly suspicious for metastatic lesions. 3. Questionable 0.8 by 0.8 cm omental tumor nodule versus dense diverticulum above the transverse colon. 4. Other imaging findings of potential clinical significance: Mild distal esophageal wall thickening, query esophagitis. Stable left adrenal adenoma. Sigmoid colon diverticulosis. Lumbar and thoracic spondylosis and degenerative disc disease contributing to multilevel impingement. 5. Aortic  atherosclerosis. Aortic Atherosclerosis (ICD10-I70.0).   01/21/2020 Initial Diagnosis   Pancreatic cancer ([place])   01/28/2020 Initial Biopsy   FINAL MICROSCOPIC DIAGNOSIS:   A. LIVER, NEEDLE CORE BIOPSY:  - Adenocarcinoma.   COMMENT:   Immunohistochemistry for CK7 is positive.  CDX-2 demonstrates weak to  moderate positive staining.  TTF-1 and PAX 8 demonstrate weak, likely  nonspecific staining.  CK20, GATA-3 and ER are negative.  The  morphologic and immunophenotypic characteristics are compatible with the  clinical impression of a primary pancreatic cancer.  Radiologic  correlation is encouraged.  If applicable, there is likely sufficient  tissue for ancillary studies (Block A2).  Dr. Vic Ripper reviewed the  case.     Chemotherapy   First-line Gemcitabine and Abraxane every 1-2 weeks on/1 week off starting next week.     02/26/2020 -  Chemotherapy    Patient is on Treatment Plan: PANCREATIC ABRAXANE / GEMCITABINE D1,8,15 Q28D      02/26/2020 Cancer Staging   Staging form: Exocrine Pancreas, AJCC 8th Edition - Clinical: Stage IV (cT1c, cN0, pM1) - Signed by Truitt Merle, MD on 02/26/2020 Total positive nodes: 0     CURRENT THERAPY:   INTERVAL HISTORY:   REVIEW OF SYSTEMS:   Constitutional: Denies fevers, chills or abnormal weight loss Eyes: Denies blurriness of vision Ears, nose, mouth, throat, and face: Denies mucositis or sore throat Respiratory: Denies cough, dyspnea or wheezes Cardiovascular: Denies palpitation, chest discomfort or lower extremity swelling Gastrointestinal:  Denies nausea, heartburn or change in bowel habits Skin: Denies abnormal skin rashes Lymphatics: Denies new lymphadenopathy or easy bruising Neurological:Denies numbness, tingling or new weaknesses Behavioral/Psych: Mood is stable, no new changes  All other systems were reviewed with the patient and are negative.  MEDICAL HISTORY:  Past  Medical History:  Diagnosis Date  . Acute medial  meniscus tear    right knee  . Anxiety   . Asthma   . Cervical spondylosis   . Chronic serous otitis media   . Constipation   . COPD (chronic obstructive pulmonary disease) ([place])   . Depression    followed by [name]; no longer of depakote, seroquel  . Diabetes mellitus without complication ([place])   . Dysuria 12/27/2009   Qualifier: Diagnosis of  By: [name] MD, [place]    . Foot drop   . GERD (gastroesophageal reflux disease)   . Heart murmur   . Hyperlipidemia   . Hypertension   . Low back pain   . Obesity   . Paraumbilical hernia   . Pre-diabetes     SURGICAL HISTORY: Past Surgical History:  Procedure Laterality Date  . ABDOMINAL HYSTERECTOMY    . ANTERIOR CERVICAL DECOMP/DISCECTOMY FUSION N/A 10/05/2014   Procedure: ACDF - C5-C6 - C6-C7;  Surgeon: Karie Chimera, MD;  Location: [place] [facility];  Service: Neurosurgery;  Laterality: N/A;  ACDF - C5-C6 - C6-C7  . CHOLECYSTECTOMY    . COLONOSCOPY    . FOOT SURGERY    . HERNIA REPAIR  1999  . IR IMAGING GUIDED PORT INSERTION  02/25/2020  . IR US GUIDANCE  01/28/2020  . KNEE ARTHROSCOPY WITH MEDIAL MENISECTOMY Right 06/14/2016   Procedure: RIGHT KNEE ARTHROSCOPY WITH PARTIAL MEDIAL MENISCECTOMY, SUBCHONDROPLASTY;  Surgeon: Leandrew Koyanagi, MD;  Location: [place];  Service: Orthopedics;  Laterality: Right;  . KNEE ARTHROSCOPY WITH SUBCHONDROPLASTY Right 06/14/2016   Procedure: KNEE ARTHROSCOPY WITH SUBCHONDROPLASTY;  Surgeon: Leandrew Koyanagi, MD;  Location: [place];  Service: Orthopedics;  Laterality: Right;  . RADIOACTIVE SEED GUIDED EXCISIONAL BREAST BIOPSY Left 06/23/2014   Procedure: RADIOACTIVE SEED GUIDED EXCISIONAL BREAST BIOPSY;  Surgeon: Stark Klein, MD;  Location: [place];  Service: General;  Laterality: Left;    I have reviewed the social history and family history with the patient and they are unchanged from previous note.  ALLERGIES:  has No Known  Allergies.  MEDICATIONS:  Current Outpatient Medications  Medication Sig Dispense Refill  . albuterol (PROVENTIL) (2.5 MG/3ML) 0.083% nebulizer solution Take 3 mLs (2.5 mg total) by nebulization every 6 (six) hours as needed for wheezing. 1080 mL 3  . albuterol (VENTOLIN HFA) 108 (90 Base) MCG/ACT inhaler INHALE 1 TO 2 PUFFS INTO THE LUNGS EVERY 6 HOURS AS NEEDED FOR WHEEZING OR SHORTNESS OF BREATH 54 g 1  . alendronate (FOSAMAX) 70 MG tablet TAKE 1 TABLET BY MOUTH EVERY 7 DAYS. TAKE WITH A FULL GLASS OF WATER ON AN EMPTY STOMACH. 12 tablet 3  . atorvastatin (LIPITOR) 40 MG tablet Take 1 tablet (40 mg total) by mouth daily. 90 tablet 3  . benazepril (LOTENSIN) 20 MG tablet TAKE 1 TABLET(20 MG) BY MOUTH DAILY 90 tablet 3  . calcium carbonate (OS-CAL) 600 MG TABS tablet Take by mouth.    . celecoxib (CELEBREX) 100 MG capsule TAKE 1 CAPSULE(100 MG) BY MOUTH TWICE DAILY 90 capsule 1  . cholecalciferol (VITAMIN D) 1000 units tablet Take 2,000 Units by mouth daily.    . diclofenac Sodium (VOLTAREN) 1 % GEL Apply 2 g topically 4 (four) times daily. 100 g 2  . famotidine (PEPCID) 40 MG tablet TAKE 1/2 TABLET(20 MG) BY MOUTH TWICE DAILY 30 tablet 3  . fluticasone (FLONASE) 50 MCG/ACT nasal spray Place 1 spray into both nostrils daily.  16 g 3  . Fluticasone-Salmeterol (ADVAIR DISKUS) 100-50 MCG/DOSE AEPB Inhale 1 puff into the lungs 2 (two) times daily. 60 each 11  . furosemide (LASIX) 20 MG tablet take 1 tablet by mouth once daily 30 tablet 11  . glipiZIDE (GLUCOTROL XL) 2.5 MG 24 hr tablet Take 1 tablet (2.5 mg total) by mouth daily with breakfast. 30 tablet 2  . hydrochlorothiazide (MICROZIDE) 12.5 MG capsule TAKE 1 CAPSULE(12.5 MG) BY MOUTH DAILY 90 capsule 1  . HYDROcodone-acetaminophen (NORCO/VICODIN) 5-325 MG tablet Take 1 tablet by mouth 2 (two) times daily as needed for severe pain. 30 tablet 0  . ipratropium (ATROVENT) 0.02 % nebulizer solution Take 0.5 mg by nebulization every 6 (six) hours as  needed for wheezing or shortness of breath.    . latanoprost (XALATAN) 0.005 % ophthalmic solution PLACE 1 DROP INTO BOTH EYES AT BEDTIME  0  . lidocaine-prilocaine (EMLA) cream Apply to affected area once 30 g 3  . loperamide (LOPERAMIDE A-D) 2 MG tablet Take 1 tablet (2 mg total) by mouth 2 (two) times daily as needed for diarrhea or loose stools. 6 tablet 0  . loratadine (CLARITIN) 10 MG tablet Take 1 tablet (10 mg total) by mouth daily. 30 tablet 11  . meclizine (ANTIVERT) 12.5 MG tablet TAKE 1 TABLET(12.5 MG) BY MOUTH TWICE DAILY AS NEEDED FOR DIZZINESS 15 tablet 3  . ondansetron (ZOFRAN) 4 MG tablet Take 1-2 tablets (4-8 mg total) by mouth every 8 (eight) hours as needed for nausea or vomiting. 15 tablet 0  . ondansetron (ZOFRAN) 8 MG tablet Take 1 tablet (8 mg total) by mouth 2 (two) times daily as needed (Nausea or vomiting). 30 tablet 1  . ONETOUCH ULTRA test strip USE TO TEST BLOOD SUGAR  ONCE DAILY 50 strip 6  . prochlorperazine (COMPAZINE) 10 MG tablet Take 1 tablet (10 mg total) by mouth every 6 (six) hours as needed (Nausea or vomiting). 30 tablet 1  . risperiDONE (RISPERDAL) 0.25 MG tablet Take 0.25 mg by mouth daily as needed.    . sitaGLIPtin (JANUVIA) 25 MG tablet Take 1 tablet (25 mg total) by mouth daily. 90 tablet 1  . traZODone (DESYREL) 50 MG tablet Take 25-50 mg by mouth at bedtime as needed for sleep.     Marland Kitchen triamcinolone cream (KENALOG) 0.1 % apply to affected area twice a day 454 g 1   No current facility-administered medications for this visit.   Facility-Administered Medications Ordered in Other Visits  Medication Dose Route Frequency Provider Last Rate Last Admin  . gemcitabine (GEMZAR) 1,672 mg in sodium chloride 0.9 % 250 mL chemo infusion  1,000 mg/m2 (Treatment Plan Recorded) Intravenous Once Truitt Merle, MD      . heparin lock flush 100 unit/mL  500 Units Intracatheter Once PRN Truitt Merle, MD      . sodium chloride flush (NS) 0.9 % injection 10 mL  10 mL  Intracatheter PRN Truitt Merle, MD        PHYSICAL EXAMINATION: ECOG PERFORMANCE STATUS: 1 - Symptomatic but completely ambulatory  Vitals:   02/26/20 1419  BP: (!) 171/83  Pulse: 89  Temp: 98.4 F (36.9 C)  SpO2: 100%   Filed Weights   02/26/20 1419  Weight: 141 lb 6 oz (64.1 kg)    GENERAL:alert, no distress and comfortable SKIN: skin color, texture, turgor are normal, no rashes or significant lesions, port site clean  EYES: normal, Conjunctiva are pink and non-injected, sclera clear Musculoskeletal:no cyanosis of digits and  no clubbing  NEURO: alert & oriented x 3 with fluent speech, no focal motor/sensory deficits  LABORATORY DATA:  I have reviewed the data as listed CBC Latest Ref Rng & Units 02/26/2020 02/19/2020 01/28/2020  WBC 4.0 - 10.5 K/uL 8.5 8.7 6.8  Hemoglobin 12.0 - 15.0 g/dL 12.5 13.7 13.7  Hematocrit 36.0 - 46.0 % 38.6 42.9 43.1  Platelets 150 - 400 K/uL 207 251 242     CMP Latest Ref Rng & Units 02/26/2020 02/19/2020 01/28/2020  Glucose 70 - 99 mg/dL 250(H) 248(H) 162(H)  BUN 8 - 23 mg/dL 9 11 13   Creatinine 0.44 - 1.00 mg/dL 0.81 0.89 0.76  Sodium 135 - 145 mmol/L 138 138 138  Potassium 3.5 - 5.1 mmol/L 3.7 3.6 3.7  Chloride 98 - 111 mmol/L 101 97(L) 95(L)  CO2 22 - 32 mmol/L 29 32 28  Calcium 8.9 - 10.3 mg/dL 9.1 9.7 10.0  Total Protein 6.5 - 8.1 g/dL 6.9 7.6 8.0  Total Bilirubin 0.3 - 1.2 mg/dL 1.2 1.1 1.0  Alkaline Phos 38 - 126 U/L 505(H) 471(H) 306(H)  AST 15 - 41 U/L 91(H) 95(H) 88(H)  ALT 0 - 44 U/L 78(H) 85(H) 83(H)      RADIOGRAPHIC STUDIES: I have personally reviewed the radiological images as listed and agreed with the findings in the report. IR IMAGING GUIDED PORT INSERTION  Result Date: 02/25/2020 INDICATION: 70 year old with metastatic pancreatic cancer. Port-A-Cath needed for chemotherapy. EXAM: FLUOROSCOPIC AND ULTRASOUND GUIDED PLACEMENT OF A SUBCUTANEOUS PORT COMPARISON:  None. MEDICATIONS: Ancef 2 g; The antibiotic was administered  within an appropriate time interval prior to skin puncture. ANESTHESIA/SEDATION: Versed 2.0 mg IV; Fentanyl 100 mcg IV; Moderate Sedation Time:  37 minutes The patient was continuously monitored during the procedure by the interventional radiology nurse under my direct supervision. FLUOROSCOPY TIME:  1 minute, 12 seconds, 10 mGy COMPLICATIONS: None immediate. PROCEDURE: The procedure, risks, benefits, and alternatives were explained to the patient. Questions regarding the procedure were encouraged and answered. The patient understands and consents to the procedure. Patient was placed supine on the interventional table. Ultrasound confirmed a patent right internal jugular vein. Ultrasound image was saved for documentation. The right chest and neck were cleaned with a skin antiseptic and a sterile drape was placed. Maximal barrier sterile technique was utilized including caps, mask, sterile gowns, sterile gloves, sterile drape, hand hygiene and skin antiseptic. The right neck was anesthetized with 1% lidocaine. Small incision was made in the right neck with a blade. Micropuncture set was placed in the right internal jugular vein with ultrasound guidance. The micropuncture wire was used for measurement purposes. The right chest was anesthetized with 1% lidocaine with epinephrine. #15 blade was used to make an incision and a subcutaneous port pocket was formed. [place] was assembled. Subcutaneous tunnel was formed with a stiff tunneling device. The port catheter was brought through the subcutaneous tunnel. The port was placed in the subcutaneous pocket and sutured in place. The micropuncture set was exchanged for a peel-away sheath. The catheter was placed through the peel-away sheath and the tip was positioned at the superior cavoatrial junction. Catheter placement was confirmed with fluoroscopy. The port was accessed and flushed with heparinized saline. The port pocket was closed using two layers of  absorbable sutures and Dermabond. The vein skin site was closed using a single layer of absorbable suture and Dermabond. Sterile dressings were applied. Patient tolerated the procedure well without an immediate complication. Ultrasound and fluoroscopic images were  taken and saved for this procedure. IMPRESSION: Placement of a subcutaneous port device. Catheter tip at the superior cavoatrial junction. Electronically Signed   By: Markus Daft M.D.   On: 02/25/2020 15:28     ASSESSMENT & PLAN:  Mariah Henderson is a 70 y.o. female with    1. Pancreatic cancer with Liver metastasis, stage IV  -Her CT AP from 01/13/20 showed 2cm mass on tail of pancreas.The mass abuts the posterior gastric wall but without definite gastric invasion.Scan also showsmultiple (at least5)hepatic masses highly suspicious for metastatic lesionsandQuestionable 0.8 by 0.8 cm omental tumor nodule versus dense diverticulum above the transverse colon. -I discussed her liver biopsy from 01/28/20 showed adenocarcinoma from pancreatic primary. I reviewed this with patient today.  -I discussed her CT chest which showed no metastasis  -she is going to start first line chemo with gemcitabine and Abraxane today.  I reviewed potential side effects with her again, and the management, she voiced good understanding. -the goal of therapy is palliative to prolong her life  -Due to her advanced age, limited social support, I will reduce her Abraxane to 100 mg/m2 for first cycle  -FO result pending  -F/u in one week for toxicity checkup   2. Abdominal Pain, low appetite, weight loss, Transmanitis  -She has had abdominal pain for the past month, 5/10. She was prescribed hydrocodone but did not pick up. She is fine to use OTC pain medication.  -She also has had sudden loss of appetite and weight loss since October 2021. She has overall lost 10 pounds. Her weight continues to trend down.  -dietician referral, I again encourage her to take  nutritional supplement   3. Comorbidities: Asthma, Borderline Diabetic, GERD, Heart murmur, HLD, HTN, arthritis  -On medications, including multiple diuretics.  -Managed by PCP.  -we discussed that her BG and BP maybe impacted by chemo, will watch closely and adjust meds as needed   4. Social support -She lives alone in 1 level house and she is retired. She has 1 adult son who lives in [place]. She has a twin and 3 other sisters who live in [place], and other siblings and relative in [place].  -Her son came in today, and will help her at home    PLAN: -proceed with first cycle gemcitabine and abraxane with dose reduction for Abraxane today  -lab, flush and f/u in one week for toxicity checkup -Lab, flush, follow-up and second cycle chemotherapy in 2 weeks    All questions were answered. The patient knows to call the clinic with any problems, questions or concerns. No barriers to learning was detected. I spent 30 minutes counseling the patient face to face. The total time spent in the appointment was 30 minutes and more than 50% was on counseling and review of test results     Truitt Merle, MD 02/26/20

## 2020-02-27 ENCOUNTER — Telehealth: Payer: Self-pay | Admitting: Hematology

## 2020-02-27 ENCOUNTER — Telehealth: Payer: Self-pay | Admitting: *Deleted

## 2020-02-27 ENCOUNTER — Encounter: Payer: Medicare Other | Admitting: Internal Medicine

## 2020-02-27 NOTE — Telephone Encounter (Signed)
Scheduled follow-up appointment per 2/10 los. Patient is aware. °

## 2020-03-01 ENCOUNTER — Encounter: Payer: Self-pay | Admitting: *Deleted

## 2020-03-01 NOTE — Progress Notes (Signed)
[place]   [phone] [phone]) [phone]   Clinic Follow up Note   Patient Care Team: Sid Falcon, MD as PCP - General (Internal Medicine) Forrest Moron, [place] (Optometry) Jonnie Finner, RN as Oncology Nurse Navigator Truitt Merle, MD as Consulting Physician (Oncology) Gwyndolyn Kaufman, RN as Registered Nurse  Date of Service:  03/04/2020  CHIEF COMPLAINT: F/u Pancreaticcancer withliver metastasis  SUMMARY OF ONCOLOGIC HISTORY: Oncology History Overview Note  Cancer Staging Pancreatic cancer [facility]) Staging form: Exocrine Pancreas, AJCC 8th Edition - Clinical: Stage IV (cT1c, cN0, pM1) - Signed by Truitt Merle, MD on 02/26/2020 Total positive nodes: 0    Pancreatic cancer ([place])  01/13/2020 Imaging   US Abdomen 01/13/20  IMPRESSION: 1. Suggestion of 3 vascular hypoechoic mass within the liver measuring up to 2.5 cm. 2. Suggestion of a hypoechoic mass within the tail of the pancreas that is not well visualized. 3. Findings concerning for malignancy, please see separately dictated CT abdomen pelvis 01/13/2020.   01/13/2020 Imaging   CT AP 01/13/20  IMPRESSION: 1. Hypoenhancing 2.0 cm in long axis mass in the pancreatic tail compatible with pancreatic adenocarcinoma. The mass abuts the posterior gastric wall but without definite gastric invasion. The splenic vein appears narrowed/attenuated compared to prior exams and the lesion abuts the margin of the splenic artery. 2. Hypoenhancing hepatic masses are new compared to 11/22/2017 and are highly suspicious for metastatic lesions. 3. Questionable 0.8 by 0.8 cm omental tumor nodule versus dense diverticulum above the transverse colon. 4. Other imaging findings of potential clinical significance: Mild distal esophageal wall thickening, query esophagitis. Stable left adrenal adenoma. Sigmoid colon diverticulosis. Lumbar and thoracic spondylosis and degenerative disc disease contributing to  multilevel impingement. 5. Aortic atherosclerosis. Aortic Atherosclerosis (ICD10-I70.0).   01/21/2020 Initial Diagnosis   Pancreatic cancer ([place])   01/28/2020 Initial Biopsy   FINAL MICROSCOPIC DIAGNOSIS:   A. LIVER, NEEDLE CORE BIOPSY:  - Adenocarcinoma.   COMMENT:   Immunohistochemistry for CK7 is positive.  CDX-2 demonstrates weak to  moderate positive staining.  TTF-1 and PAX 8 demonstrate weak, likely  nonspecific staining.  CK20, GATA-3 and ER are negative.  The  morphologic and immunophenotypic characteristics are compatible with the  clinical impression of a primary pancreatic cancer.  Radiologic  correlation is encouraged.  If applicable, there is likely sufficient  tissue for ancillary studies (Block A2).  Dr. Vic Ripper reviewed the  case.     Chemotherapy   First-line Gemcitabine and Abraxane every 1-2 weeks on/1 week off starting next week.     02/26/2020 -  Chemotherapy    Patient is on Treatment Plan: PANCREATIC ABRAXANE / GEMCITABINE D1,8,15 Q28D      02/26/2020 Cancer Staging   Staging form: Exocrine Pancreas, AJCC 8th Edition - Clinical: Stage IV (cT1c, cN0, pM1) - Signed by Truitt Merle, MD on 02/26/2020 Total positive nodes: 0      CURRENT THERAPY:  First-line Gemcitabine and Abraxane every 1-2 weeks on/1 week off starting 02/26/20  INTERVAL HISTORY:  Mariah Henderson is here for a follow up. She presents to the clinic with her niece.  Did well with first cycle chemo last week, mild fatigue after chemo, no N/V, BM is normal  She has tried Glucerna, and is able to keep one bottle down, she eats small meals.  Abdominal pain is mild and stable, some back pain Weight stable, she is able to function at home   All other systems were reviewed with the patient and  are negative.  MEDICAL HISTORY:  Past Medical History:  Diagnosis Date  . Acute medial meniscus tear    right knee  . Anxiety   . Asthma   . Cervical spondylosis   . Chronic serous otitis media    . Constipation   . COPD (chronic obstructive pulmonary disease) ([place])   . Depression    followed by [name]; no longer of depakote, seroquel  . Diabetes mellitus without complication ([place])   . Dysuria 12/27/2009   Qualifier: Diagnosis of  By: [name] MD, Amanjot    . Foot drop   . GERD (gastroesophageal reflux disease)   . Heart murmur   . Hyperlipidemia   . Hypertension   . Low back pain   . Obesity   . Paraumbilical hernia   . Pre-diabetes     SURGICAL HISTORY: Past Surgical History:  Procedure Laterality Date  . ABDOMINAL HYSTERECTOMY    . ANTERIOR CERVICAL DECOMP/DISCECTOMY FUSION N/A 10/05/2014   Procedure: ACDF - C5-C6 - C6-C7;  Surgeon: Karie Chimera, MD;  Location: [facility];  Service: Neurosurgery;  Laterality: N/A;  ACDF - C5-C6 - C6-C7  . CHOLECYSTECTOMY    . COLONOSCOPY    . FOOT SURGERY    . HERNIA REPAIR  1999  . IR IMAGING GUIDED PORT INSERTION  02/25/2020  . IR US GUIDANCE  01/28/2020  . KNEE ARTHROSCOPY WITH MEDIAL MENISECTOMY Right 06/14/2016   Procedure: RIGHT KNEE ARTHROSCOPY WITH PARTIAL MEDIAL MENISCECTOMY, SUBCHONDROPLASTY;  Surgeon: Leandrew Koyanagi, MD;  Location: [facility];  Service: Orthopedics;  Laterality: Right;  . KNEE ARTHROSCOPY WITH SUBCHONDROPLASTY Right 06/14/2016   Procedure: KNEE ARTHROSCOPY WITH SUBCHONDROPLASTY;  Surgeon: Leandrew Koyanagi, MD;  Location: [facility];  Service: Orthopedics;  Laterality: Right;  . RADIOACTIVE SEED GUIDED EXCISIONAL BREAST BIOPSY Left 06/23/2014   Procedure: RADIOACTIVE SEED GUIDED EXCISIONAL BREAST BIOPSY;  Surgeon: Stark Klein, MD;  Location: [facility];  Service: General;  Laterality: Left;    I have reviewed the social history and family history with the patient and they are unchanged from previous note.  ALLERGIES:  has No Known Allergies.  MEDICATIONS:  Current Outpatient Medications  Medication Sig Dispense Refill  . albuterol (PROVENTIL) (2.5 MG/3ML)  0.083% nebulizer solution Take 3 mLs (2.5 mg total) by nebulization every 6 (six) hours as needed for wheezing. 1080 mL 3  . albuterol (VENTOLIN HFA) 108 (90 Base) MCG/ACT inhaler INHALE 1 TO 2 PUFFS INTO THE LUNGS EVERY 6 HOURS AS NEEDED FOR WHEEZING OR SHORTNESS OF BREATH 54 g 1  . alendronate (FOSAMAX) 70 MG tablet TAKE 1 TABLET BY MOUTH EVERY 7 DAYS. TAKE WITH A FULL GLASS OF WATER ON AN EMPTY STOMACH. 12 tablet 3  . atorvastatin (LIPITOR) 40 MG tablet Take 1 tablet (40 mg total) by mouth daily. 90 tablet 3  . benazepril (LOTENSIN) 20 MG tablet TAKE 1 TABLET(20 MG) BY MOUTH DAILY 90 tablet 3  . calcium carbonate (OS-CAL) 600 MG TABS tablet Take by mouth.    . celecoxib (CELEBREX) 100 MG capsule TAKE 1 CAPSULE(100 MG) BY MOUTH TWICE DAILY 90 capsule 1  . cholecalciferol (VITAMIN D) 1000 units tablet Take 2,000 Units by mouth daily.    . diclofenac Sodium (VOLTAREN) 1 % GEL Apply 2 g topically 4 (four) times daily. 100 g 2  . famotidine (PEPCID) 40 MG tablet TAKE 1/2 TABLET(20 MG) BY MOUTH TWICE DAILY 30 tablet 3  . fluticasone (FLONASE) 50 MCG/ACT nasal spray  Place 1 spray into both nostrils daily. 16 g 3  . Fluticasone-Salmeterol (ADVAIR DISKUS) 100-50 MCG/DOSE AEPB Inhale 1 puff into the lungs 2 (two) times daily. 60 each 11  . furosemide (LASIX) 20 MG tablet take 1 tablet by mouth once daily 30 tablet 11  . glipiZIDE (GLUCOTROL XL) 2.5 MG 24 hr tablet Take 1 tablet (2.5 mg total) by mouth daily with breakfast. 30 tablet 2  . hydrochlorothiazide (MICROZIDE) 12.5 MG capsule TAKE 1 CAPSULE(12.5 MG) BY MOUTH DAILY 90 capsule 1  . HYDROcodone-acetaminophen (NORCO/VICODIN) 5-325 MG tablet Take 1 tablet by mouth 2 (two) times daily as needed for severe pain. 30 tablet 0  . ipratropium (ATROVENT) 0.02 % nebulizer solution Take 0.5 mg by nebulization every 6 (six) hours as needed for wheezing or shortness of breath.    . latanoprost (XALATAN) 0.005 % ophthalmic solution PLACE 1 DROP INTO BOTH EYES AT  BEDTIME  0  . lidocaine-prilocaine (EMLA) cream Apply to affected area once 30 g 3  . loperamide (LOPERAMIDE A-D) 2 MG tablet Take 1 tablet (2 mg total) by mouth 2 (two) times daily as needed for diarrhea or loose stools. 6 tablet 0  . loratadine (CLARITIN) 10 MG tablet Take 1 tablet (10 mg total) by mouth daily. 30 tablet 11  . meclizine (ANTIVERT) 12.5 MG tablet TAKE 1 TABLET(12.5 MG) BY MOUTH TWICE DAILY AS NEEDED FOR DIZZINESS 15 tablet 3  . ondansetron (ZOFRAN) 4 MG tablet Take 1-2 tablets (4-8 mg total) by mouth every 8 (eight) hours as needed for nausea or vomiting. 15 tablet 0  . ondansetron (ZOFRAN) 8 MG tablet Take 1 tablet (8 mg total) by mouth 2 (two) times daily as needed (Nausea or vomiting). 30 tablet 1  . ONETOUCH ULTRA test strip USE TO TEST BLOOD SUGAR  ONCE DAILY 50 strip 6  . prochlorperazine (COMPAZINE) 10 MG tablet Take 1 tablet (10 mg total) by mouth every 6 (six) hours as needed (Nausea or vomiting). 30 tablet 1  . risperiDONE (RISPERDAL) 0.25 MG tablet Take 0.25 mg by mouth daily as needed.    . sitaGLIPtin (JANUVIA) 25 MG tablet Take 1 tablet (25 mg total) by mouth daily. 90 tablet 1  . traZODone (DESYREL) 50 MG tablet Take 25-50 mg by mouth at bedtime as needed for sleep.     Marland Kitchen triamcinolone cream (KENALOG) 0.1 % apply to affected area twice a day 454 g 1   No current facility-administered medications for this visit.    PHYSICAL EXAMINATION: ECOG PERFORMANCE STATUS: 2 - Symptomatic, <50% confined to bed  Vitals:   03/04/20 1206  BP: (!) 158/83  Pulse: 82  Resp: 16  Temp: (!) 97.5 F (36.4 C)  SpO2: 100%   Filed Weights   03/04/20 1206  Weight: 138 lb 4.8 oz (62.7 kg)    GENERAL:alert, no distress and comfortable SKIN: skin color, texture, turgor are normal, no rashes or significant lesions EYES: normal, Conjunctiva are pink and non-injected, sclera clear Musculoskeletal:no cyanosis of digits and no clubbing  NEURO: alert & oriented x 3 with fluent  speech, no focal motor/sensory deficits  LABORATORY DATA:  I have reviewed the data as listed CBC Latest Ref Rng & Units 03/04/2020 02/26/2020 02/19/2020  WBC 4.0 - 10.5 K/uL 3.9(L) 8.5 8.7  Hemoglobin 12.0 - 15.0 g/dL 12.1 12.5 13.7  Hematocrit 36.0 - 46.0 % 37.6 38.6 42.9  Platelets 150 - 400 K/uL 99(L) 207 251     CMP Latest Ref Rng & Units 03/04/2020  02/26/2020 02/19/2020  Glucose 70 - 99 mg/dL 176(H) 250(H) 248(H)  BUN 8 - 23 mg/dL 9 9 11   Creatinine 0.44 - 1.00 mg/dL 0.73 0.81 0.89  Sodium 135 - 145 mmol/L 137 138 138  Potassium 3.5 - 5.1 mmol/L 3.2(L) 3.7 3.6  Chloride 98 - 111 mmol/L 96(L) 101 97(L)  CO2 22 - 32 mmol/L 31 29 32  Calcium 8.9 - 10.3 mg/dL 9.2 9.1 9.7  Total Protein 6.5 - 8.1 g/dL 6.7 6.9 7.6  Total Bilirubin 0.3 - 1.2 mg/dL 1.9(H) 1.2 1.1  Alkaline Phos 38 - 126 U/L 555(H) 505(H) 471(H)  AST 15 - 41 U/L 121(H) 91(H) 95(H)  ALT 0 - 44 U/L 99(H) 78(H) 85(H)      RADIOGRAPHIC STUDIES: I have personally reviewed the radiological images as listed and agreed with the findings in the report. No results found.   ASSESSMENT & PLAN:  Mariah Henderson is a 70 y.o. female with   1. Pancreaticcancer withLivermetastasis, stage IV  -Her CT AP from 01/13/20 showed 2cm mass on tail of pancreas.The mass abuts the posterior gastric wall but without definite gastric invasion.Scan also showsmultiple (at least5)hepatic masses highly suspicious for metastatic lesionsandQuestionable 0.8 by 0.8 cm omental tumor nodule versus dense diverticulum above the transverse colon. -Her liver biopsy from 01/28/20 showed adenocarcinoma from pancreatic primary. Her 02/11/20 CT Chest howed no metastasis  -I started her on first line chemo with gemcitabine and Abraxane every 1-2 weeks beginning 02/26/20.  -S/p week 1 treatment, she tolerated well overall, with stable fatigue, no significant nausea or vomiting -Lab reviewed. No need IVF  -She will return next week for cycle 2   2.  Abdominal Pain, low appetite, weight loss, Transmanitis  -She has had abdominal pain for the past month, 5/10. She was prescribed hydrocodone but did not pick up. She is fine to use OTC pain medication.  -She also has had sudden loss of appetite and weight loss since October 2021. She has overall lost 10 pounds.Her weight continues to trend down. -she missed a call from our dietitian, will reschedule -I encouraged her to drink more Glucerna   3. Comorbidities: Asthma, Borderline Diabetic, GERD, Heart murmur, HLD, HTN, arthritis  -On medications, including multiple diuretics.  -Managed by PCP.   4. Social support -She lives alone in 1 level house and she is retired. She has 1 adult son who lives in [place]. She has a twin and 3 other sisters who live in [place], and other siblings and relative in [place].  -Her son will help her at home   5. Cytopenias  -She developed mild neutropenia and thrombocytopenia, secondary to chemotherapy -Continue monitoring  6. Goal of care discussion  -We again discussed the incurable nature of her cancer, and the overall poor prognosis, especially if she does not have good response to chemotherapy or progress on chemo -The patient understands the goal of care is palliative. -I recommend DNR/DNI, she will think about it    PLAN: -She tolerated first cycle of chemotherapy well overall -No need IV fluids or other supportive care for now -She will return next week for cycle 2 chemo    No problem-specific Assessment & Plan notes found for this encounter.   No orders of the defined types were placed in this encounter.  All questions were answered. The patient knows to call the clinic with any problems, questions or concerns. No barriers to learning was detected. The total time spent in the appointment was 30 minutes.  Truitt Merle, MD 03/04/2020   I, Joslyn Devon, am acting as scribe for Truitt Merle, MD.   I have reviewed the above documentation  for accuracy and completeness, and I agree with the above.

## 2020-03-02 ENCOUNTER — Inpatient Hospital Stay: Payer: Medicare Other | Admitting: Nutrition

## 2020-03-02 ENCOUNTER — Telehealth: Payer: Self-pay

## 2020-03-02 NOTE — Progress Notes (Signed)
Contacted patient by phone at home number at scheduled appointment time. Patient not available. I left my name and phone number for return call.

## 2020-03-02 NOTE — Telephone Encounter (Signed)
Surgical clearance for cataract extraction faxed to [facility] at [phone]

## 2020-03-04 ENCOUNTER — Telehealth: Payer: Self-pay | Admitting: Nutrition

## 2020-03-04 ENCOUNTER — Inpatient Hospital Stay: Payer: Medicare Other

## 2020-03-04 ENCOUNTER — Inpatient Hospital Stay (HOSPITAL_BASED_OUTPATIENT_CLINIC_OR_DEPARTMENT_OTHER): Payer: Medicare Other | Admitting: Hematology

## 2020-03-04 ENCOUNTER — Other Ambulatory Visit: Payer: Self-pay

## 2020-03-04 ENCOUNTER — Encounter: Payer: Self-pay | Admitting: Hematology

## 2020-03-04 ENCOUNTER — Ambulatory Visit: Payer: Medicare Other | Admitting: Nutrition

## 2020-03-04 VITALS — BP 158/83 | HR 82 | Temp 97.5°F | Resp 16 | Ht 60.0 in | Wt 138.3 lb

## 2020-03-04 DIAGNOSIS — C787 Secondary malignant neoplasm of liver and intrahepatic bile duct: Secondary | ICD-10-CM | POA: Diagnosis not present

## 2020-03-04 DIAGNOSIS — C252 Malignant neoplasm of tail of pancreas: Secondary | ICD-10-CM | POA: Diagnosis not present

## 2020-03-04 DIAGNOSIS — Z7189 Other specified counseling: Secondary | ICD-10-CM

## 2020-03-04 DIAGNOSIS — Z95828 Presence of other vascular implants and grafts: Secondary | ICD-10-CM

## 2020-03-04 DIAGNOSIS — Z5111 Encounter for antineoplastic chemotherapy: Secondary | ICD-10-CM | POA: Diagnosis not present

## 2020-03-04 LAB — CMP (CANCER CENTER ONLY)
ALT: 99 U/L — ABNORMAL HIGH (ref 0–44)
AST: 121 U/L — ABNORMAL HIGH (ref 15–41)
Albumin: 2.9 g/dL — ABNORMAL LOW (ref 3.5–5.0)
Alkaline Phosphatase: 555 U/L — ABNORMAL HIGH (ref 38–126)
Anion gap: 10 (ref 5–15)
BUN: 9 mg/dL (ref 8–23)
CO2: 31 mmol/L (ref 22–32)
Calcium: 9.2 mg/dL (ref 8.9–10.3)
Chloride: 96 mmol/L — ABNORMAL LOW (ref 98–111)
Creatinine: 0.73 mg/dL (ref 0.44–1.00)
GFR, Estimated: >60 mL/min (ref >60)
Glucose, Bld: 176 mg/dL — ABNORMAL HIGH (ref 70–99)
Potassium: 3.2 mmol/L — ABNORMAL LOW (ref 3.5–5.1)
Sodium: 137 mmol/L (ref 135–145)
Total Bilirubin: 1.9 mg/dL — ABNORMAL HIGH (ref 0.3–1.2)
Total Protein: 6.7 g/dL (ref 6.5–8.1)

## 2020-03-04 LAB — CBC WITH DIFFERENTIAL (CANCER CENTER ONLY)
Abs Immature Granulocytes: 0.02 10*3/uL (ref 0.00–0.07)
Basophils Absolute: 0 10*3/uL (ref 0.0–0.1)
Basophils Relative: 0 %
Eosinophils Absolute: 0.3 10*3/uL (ref 0.0–0.5)
Eosinophils Relative: 7 %
HCT: 37.6 % (ref 36.0–46.0)
Hemoglobin: 12.1 g/dL (ref 12.0–15.0)
Immature Granulocytes: 1 %
Lymphocytes Relative: 7 %
Lymphs Abs: 0.3 10*3/uL — ABNORMAL LOW (ref 0.7–4.0)
MCH: 27.7 pg (ref 26.0–34.0)
MCHC: 32.2 g/dL (ref 30.0–36.0)
MCV: 86 fL (ref 80.0–100.0)
Monocytes Absolute: 0.2 10*3/uL (ref 0.1–1.0)
Monocytes Relative: 6 %
Neutro Abs: 3.1 10*3/uL (ref 1.7–7.7)
Neutrophils Relative %: 79 %
Platelet Count: 99 10*3/uL — ABNORMAL LOW (ref 150–400)
RBC: 4.37 MIL/uL (ref 3.87–5.11)
RDW: 14 % (ref 11.5–15.5)
WBC Count: 3.9 10*3/uL — ABNORMAL LOW (ref 4.0–10.5)
nRBC: 0 % (ref 0.0–0.2)

## 2020-03-04 MED ORDER — HEPARIN SOD (PORK) LOCK FLUSH 100 UNIT/ML IV SOLN
500.0000 [IU] | Freq: Once | INTRAVENOUS | Status: AC
  Administered 2020-03-04: 500 [IU]
  Filled 2020-03-04: qty 5

## 2020-03-04 MED ORDER — SODIUM CHLORIDE 0.9% FLUSH
10.0000 mL | Freq: Once | INTRAVENOUS | Status: AC
  Administered 2020-03-04: 10 mL
  Filled 2020-03-04: qty 10

## 2020-03-04 NOTE — Progress Notes (Signed)
See telephone note.

## 2020-03-04 NOTE — Telephone Encounter (Signed)
Phone consult completed with patient diagnosed with pancreas cancer followed by Dr. Burr Medico.  She is receiving gemcitabine and Abraxane.  Past medical history includes diabetes, obesity, hypertension, hyperlipidemia, GERD, depression, COPD, constipation, and anxiety.  Medications include Pepcid, Lasix, Glucotrol XL, Januvia, Zofran, and Compazine.  Labs include glucose 258 and albumin 3.1.  Height: 5 feet 0 inches. Weight: 141.38 pounds February 10. Usual body weight: 178 pounds in April 2021. BMI: 27.6. ECOG: 1.    Patient endorses decreased oral intake secondary to early satiety. She has lost 21% body weight over 10 months which is significant. Patient denies nausea, vomiting, constipation, diarrhea. Her sister purchased Glucerna for her but she has not tried it yet. She ate 3 small donuts and water for breakfast.  Sometimes she eats oatmeal.  She reports she is still full from breakfast today.  Nutrition diagnosis:  Unintended weight loss related to pancreas cancer as evidenced by 21% weight loss over 10 months.  Intervention: Educated patient to consume small, frequent snacks throughout the day every 2-3 hours. Stressed importance of just taking as many bites and sips of liquid as she can. Recommended try Glucerna for increased calories and protein. Suggested patient choose some lower fat foods to help with GI motility. Mail nutrition fact sheets.  Provided coupons and contact information.  Questions were answered and teach back method used.  Monitoring, evaluation, goals: Patient will tolerate increased calories and protein to minimize further weight loss.  Next visit: To be scheduled as needed.  Patient agrees to contact me with questions or concerns.  **Disclaimer: This note was dictated with voice recognition software. Similar sounding words can inadvertently be transcribed and this note may contain transcription errors which may not have been corrected upon publication of  note.**

## 2020-03-05 ENCOUNTER — Telehealth: Payer: Self-pay | Admitting: Hematology

## 2020-03-05 NOTE — Telephone Encounter (Signed)
Scheduled follow-up appointments per 2/17 los. Patient is aware and requested a calendar to be mailed. Mailed calendar.

## 2020-03-08 ENCOUNTER — Telehealth: Payer: Self-pay | Admitting: Genetic Counselor

## 2020-03-08 ENCOUNTER — Encounter: Payer: Self-pay | Admitting: Genetic Counselor

## 2020-03-08 DIAGNOSIS — Z1379 Encounter for other screening for genetic and chromosomal anomalies: Secondary | ICD-10-CM | POA: Insufficient documentation

## 2020-03-08 NOTE — Telephone Encounter (Signed)
Contacted patient in attempt to disclose results of genetic testing.  LVM with contact information requesting a call back.  

## 2020-03-09 ENCOUNTER — Telehealth: Payer: Self-pay

## 2020-03-09 DIAGNOSIS — C252 Malignant neoplasm of tail of pancreas: Secondary | ICD-10-CM

## 2020-03-09 NOTE — Telephone Encounter (Signed)
03/09/2020 12:15PM  INCOMING CALL: Incoming call from [facility] asking what time her chemotherapy infusion is on Thursday. I review her current schedule and it appears [place] is scheduled for labs, an MD visit with Dr. Burr Medico, and her infusion on Friday, 03/12/2020, not Thursday. I inform her of all appointments and their time. She requests that I follow-up with Dr. Ernestina Penna nurse since she typically receives her infusions on Friday and has to arrive for transportation. I assure her that I will follow-up with the nurse and have someone call her for confirmation: she verbalizes appreciation for the information.  Following our call, I immediately speak with nurse Santiago Glad who verifies that Garrie should be here on Friday for treatment. Ms. Yontz home phone number is provided and Santiago Glad will follow-up with her.  Dionne Bucy. Sharlett Iles, BSN, RN, CIC 03/09/2020 1:01 PM

## 2020-03-10 NOTE — Progress Notes (Signed)
[place]   Telephone:([phone]) [phone] Fax:(336) [phone]   Clinic Follow up Note   Patient Care Team: Sid Falcon, MD as PCP - General (Internal Medicine) Forrest Moron, [place] (Optometry) Jonnie Finner, RN as Oncology Nurse Navigator Truitt Merle, MD as Consulting Physician (Oncology) Gwyndolyn Kaufman, RN as Registered Nurse  Date of Service:  03/12/2020  CHIEF COMPLAINT: F/u Pancreaticcancer withliver metastasis  SUMMARY OF ONCOLOGIC HISTORY: Oncology History Overview Note  Cancer Staging Pancreatic cancer [facility]) Staging form: Exocrine Pancreas, AJCC 8th Edition - Clinical: Stage IV (cT1c, cN0, pM1) - Signed by Truitt Merle, MD on 02/26/2020 Total positive nodes: 0    Pancreatic cancer ([place])  01/13/2020 Imaging   US Abdomen 01/13/20  IMPRESSION: 1. Suggestion of 3 vascular hypoechoic mass within the liver measuring up to 2.5 cm. 2. Suggestion of a hypoechoic mass within the tail of the pancreas that is not well visualized. 3. Findings concerning for malignancy, please see separately dictated CT abdomen pelvis 01/13/2020.   01/13/2020 Imaging   CT AP 01/13/20  IMPRESSION: 1. Hypoenhancing 2.0 cm in long axis mass in the pancreatic tail compatible with pancreatic adenocarcinoma. The mass abuts the posterior gastric wall but without definite gastric invasion. The splenic vein appears narrowed/attenuated compared to prior exams and the lesion abuts the margin of the splenic artery. 2. Hypoenhancing hepatic masses are new compared to 11/22/2017 and are highly suspicious for metastatic lesions. 3. Questionable 0.8 by 0.8 cm omental tumor nodule versus dense diverticulum above the transverse colon. 4. Other imaging findings of potential clinical significance: Mild distal esophageal wall thickening, query esophagitis. Stable left adrenal adenoma. Sigmoid colon diverticulosis. Lumbar and thoracic spondylosis and degenerative disc disease contributing to  multilevel impingement. 5. Aortic atherosclerosis. Aortic Atherosclerosis (ICD10-I70.0).   01/21/2020 Initial Diagnosis   Pancreatic cancer ([place])   01/28/2020 Initial Biopsy   FINAL MICROSCOPIC DIAGNOSIS:   A. LIVER, NEEDLE CORE BIOPSY:  - Adenocarcinoma.   COMMENT:   Immunohistochemistry for CK7 is positive.  CDX-2 demonstrates weak to  moderate positive staining.  TTF-1 and PAX 8 demonstrate weak, likely  nonspecific staining.  CK20, GATA-3 and ER are negative.  The  morphologic and immunophenotypic characteristics are compatible with the  clinical impression of a primary pancreatic cancer.  Radiologic  correlation is encouraged.  If applicable, there is likely sufficient  tissue for ancillary studies (Block A2).  Dr. Vic Ripper reviewed the  case.    02/26/2020 -  Chemotherapy   First-line Gemcitabine and Abraxane every 2 weeks starting 02/26/20    02/26/2020 Cancer Staging   Staging form: Exocrine Pancreas, AJCC 8th Edition - Clinical: Stage IV (cT1c, cN0, pM1) - Signed by Truitt Merle, MD on 02/26/2020 Total positive nodes: 0   03/08/2020 Genetic Testing   Negative hereditary cancer genetic testing: no pathogenic variants detected in [facility] Common Hereditary Cancers Panel with preliminary evidence pancreatic cancer genes.  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 The report date is March 08, 2020.    The Common Hereditary Cancers Panel with preliminary evidence pancreatic cancer genes offered by [facility] includes sequencing and/or deletion duplication testing of the following 49 genes: APC, ATM, AXIN2, BARD1, BMPR1A, BRCA1, BRCA2, BRIP1, CDH1, CDK4, CDKN2A (p14ARF), CDKN2A (p16INK4a), CHEK2, CTNNA1, DICER1, EPCAM (Deletion/duplication testing only), GREM1 (promoter region deletion/duplication testing only), FANCC, GREM1, HOXB13, KIT, MEN1, MLH1, MSH2, MSH3, MSH6, MUTYH, NBN, NF1, NHTL1, PALB2, PALLD, PDGFRA, PMS2, POLD1, POLE, PTEN, RAD50, RAD51C,  RAD51D, SDHA, SDHB, SDHC, SDHD, SMAD4, SMARCA4.  STK11, TP53, TSC1, TSC2, and VHL.  The following genes were evaluated for sequence changes only: SDHA and HOXB13 c.251G>A variant only.es only: SDHA and HOXB13 c.251G>A variant only.      CURRENT THERAPY:  First-line Gemcitabine and Abraxane every 2 weeks starting 02/26/20  INTERVAL HISTORY:  [name] is here for a follow up. She presents to the clinic with her daughter. She notes her appetite is increasing. She notes this is almost baseline. She was able to gain 1 pound since last visit. She notes pain in her mid-lower back pain. She notes this worsens based on her position. She notes this back pain is intermittent. She uses Norco once a night as needed. She tries to not take this pain medication during there day to avoid sleeping. She notes her pain is better on chemo. She notes when she recently saw her ophthalmologist she was seen to have glaucoma and cataracts. She was told she could not get more glasses until she proceeds with corrective surgery. She notes concern with surgery while on chemo.     REVIEW OF SYSTEMS:   Constitutional: Denies fevers, chills or abnormal weight loss Eyes: Denies blurriness of vision Ears, nose, mouth, throat, and face: Denies mucositis or sore throat Respiratory: Denies cough, dyspnea or wheezes Cardiovascular: Denies palpitation, chest discomfort or lower extremity swelling Gastrointestinal:  Denies nausea, heartburn or change in bowel habits Skin: Denies abnormal skin rashes Lymphatics: Denies new lymphadenopathy or easy bruising Neurological:Denies numbness, tingling or new weaknesses Behavioral/Psych: Mood is stable, no new changes  All other systems were reviewed with the patient and are negative.  MEDICAL HISTORY:  Past Medical History:  Diagnosis Date  . Acute medial meniscus tear    right knee  . Anxiety   . Asthma   . Cervical spondylosis   . Chronic serous otitis media   .  Constipation   . COPD (chronic obstructive pulmonary disease) ([place])   . Depression    followed by [name]; no longer of depakote, seroquel  . Diabetes mellitus without complication ([place])   . Dysuria 12/27/2009   Qualifier: Diagnosis of  By: [name] MD, Amanjot    . Foot drop   . GERD (gastroesophageal reflux disease)   . Heart murmur   . Hyperlipidemia   . Hypertension   . Low back pain   . Obesity   . Paraumbilical hernia   . Pre-diabetes     SURGICAL HISTORY: Past Surgical History:  Procedure Laterality Date  . ABDOMINAL HYSTERECTOMY    . ANTERIOR CERVICAL DECOMP/DISCECTOMY FUSION N/A 10/05/2014   Procedure: ACDF - C5-C6 - C6-C7;  Surgeon: Karie Chimera, MD;  Location: [facility];  Service: Neurosurgery;  Laterality: N/A;  ACDF - C5-C6 - C6-C7  . CHOLECYSTECTOMY    . COLONOSCOPY    . FOOT SURGERY    . HERNIA REPAIR  1999  . IR IMAGING GUIDED PORT INSERTION  02/25/2020  . IR US GUIDANCE  01/28/2020  . KNEE ARTHROSCOPY WITH MEDIAL MENISECTOMY Right 06/14/2016   Procedure: RIGHT KNEE ARTHROSCOPY WITH PARTIAL MEDIAL MENISCECTOMY, SUBCHONDROPLASTY;  Surgeon: Leandrew Koyanagi, MD;  Location: [facility];  Service: Orthopedics;  Laterality: Right;  . KNEE ARTHROSCOPY WITH SUBCHONDROPLASTY Right 06/14/2016   Procedure: KNEE ARTHROSCOPY WITH SUBCHONDROPLASTY;  Surgeon: Leandrew Koyanagi, MD;  Location: [facility];  Service: Orthopedics;  Laterality: Right;  . RADIOACTIVE SEED GUIDED EXCISIONAL BREAST BIOPSY Left 06/23/2014   Procedure: RADIOACTIVE SEED GUIDED EXCISIONAL BREAST BIOPSY;  Surgeon: Dorris Fetch  Barry Dienes, MD;  Location: [facility];  Service: General;  Laterality: Left;    I have reviewed the social history and family history with the patient and they are unchanged from previous note.  ALLERGIES:  has No Known Allergies.  MEDICATIONS:  Current Outpatient Medications  Medication Sig Dispense Refill  . albuterol (PROVENTIL) (2.5 MG/3ML) 0.083%  nebulizer solution Take 3 mLs (2.5 mg total) by nebulization every 6 (six) hours as needed for wheezing. 1080 mL 3  . albuterol (VENTOLIN HFA) 108 (90 Base) MCG/ACT inhaler INHALE 1 TO 2 PUFFS INTO THE LUNGS EVERY 6 HOURS AS NEEDED FOR WHEEZING OR SHORTNESS OF BREATH 54 g 1  . alendronate (FOSAMAX) 70 MG tablet TAKE 1 TABLET BY MOUTH EVERY 7 DAYS. TAKE WITH A FULL GLASS OF WATER ON AN EMPTY STOMACH. 12 tablet 3  . atorvastatin (LIPITOR) 40 MG tablet Take 1 tablet (40 mg total) by mouth daily. 90 tablet 3  . benazepril (LOTENSIN) 20 MG tablet TAKE 1 TABLET(20 MG) BY MOUTH DAILY 90 tablet 3  . calcium carbonate (OS-CAL) 600 MG TABS tablet Take by mouth.    . celecoxib (CELEBREX) 100 MG capsule TAKE 1 CAPSULE(100 MG) BY MOUTH TWICE DAILY 90 capsule 1  . cholecalciferol (VITAMIN D) 1000 units tablet Take 2,000 Units by mouth daily.    . diclofenac Sodium (VOLTAREN) 1 % GEL Apply 2 g topically 4 (four) times daily. 100 g 2  . famotidine (PEPCID) 40 MG tablet TAKE 1/2 TABLET(20 MG) BY MOUTH TWICE DAILY 30 tablet 3  . fluticasone (FLONASE) 50 MCG/ACT nasal spray Place 1 spray into both nostrils daily. 16 g 3  . Fluticasone-Salmeterol (ADVAIR DISKUS) 100-50 MCG/DOSE AEPB Inhale 1 puff into the lungs 2 (two) times daily. 60 each 11  . furosemide (LASIX) 20 MG tablet take 1 tablet by mouth once daily 30 tablet 11  . glipiZIDE (GLUCOTROL XL) 2.5 MG 24 hr tablet Take 1 tablet (2.5 mg total) by mouth daily with breakfast. 30 tablet 2  . hydrochlorothiazide (MICROZIDE) 12.5 MG capsule TAKE 1 CAPSULE(12.5 MG) BY MOUTH DAILY 90 capsule 1  . HYDROcodone-acetaminophen (NORCO/VICODIN) 5-325 MG tablet Take 1 tablet by mouth 2 (two) times daily as needed for severe pain. 30 tablet 0  . ipratropium (ATROVENT) 0.02 % nebulizer solution Take 0.5 mg by nebulization every 6 (six) hours as needed for wheezing or shortness of breath.    . latanoprost (XALATAN) 0.005 % ophthalmic solution PLACE 1 DROP INTO BOTH EYES AT BEDTIME   0  . lidocaine-prilocaine (EMLA) cream Apply to affected area once 30 g 3  . loperamide (LOPERAMIDE A-D) 2 MG tablet Take 1 tablet (2 mg total) by mouth 2 (two) times daily as needed for diarrhea or loose stools. 6 tablet 0  . loratadine (CLARITIN) 10 MG tablet Take 1 tablet (10 mg total) by mouth daily. 30 tablet 11  . meclizine (ANTIVERT) 12.5 MG tablet TAKE 1 TABLET(12.5 MG) BY MOUTH TWICE DAILY AS NEEDED FOR DIZZINESS 15 tablet 3  . ondansetron (ZOFRAN) 4 MG tablet Take 1-2 tablets (4-8 mg total) by mouth every 8 (eight) hours as needed for nausea or vomiting. 15 tablet 0  . ondansetron (ZOFRAN) 8 MG tablet Take 1 tablet (8 mg total) by mouth 2 (two) times daily as needed (Nausea or vomiting). 30 tablet 1  . ONETOUCH ULTRA test strip USE TO TEST BLOOD SUGAR  ONCE DAILY 50 strip 6  . prochlorperazine (COMPAZINE) 10 MG tablet Take 1 tablet (10 mg  total) by mouth every 6 (six) hours as needed (Nausea or vomiting). 30 tablet 1  . risperiDONE (RISPERDAL) 0.25 MG tablet Take 0.25 mg by mouth daily as needed.    . sitaGLIPtin (JANUVIA) 25 MG tablet Take 1 tablet (25 mg total) by mouth daily. 90 tablet 1  . traZODone (DESYREL) 50 MG tablet Take 25-50 mg by mouth at bedtime as needed for sleep.     Marland Kitchen triamcinolone cream (KENALOG) 0.1 % apply to affected area twice a day 454 g 1   No current facility-administered medications for this visit.   Facility-Administered Medications Ordered in Other Visits  Medication Dose Route Frequency Provider Last Rate Last Admin  . gemcitabine (GEMZAR) 1,672 mg in sodium chloride 0.9 % 250 mL chemo infusion  1,000 mg/m2 (Treatment Plan Recorded) Intravenous Once Truitt Merle, MD 588 mL/hr at 03/12/20 1457 1,672 mg at 03/12/20 1457  . heparin lock flush 100 unit/mL  500 Units Intracatheter Once PRN Truitt Merle, MD      . sodium chloride flush (NS) 0.9 % injection 10 mL  10 mL Intracatheter PRN Truitt Merle, MD        PHYSICAL EXAMINATION: ECOG PERFORMANCE STATUS: 1 -  Symptomatic but completely ambulatory  Vitals:   03/12/20 1158  BP: 140/79  Pulse: 77  Resp: 16  Temp: 97.7 F (36.5 C)  SpO2: 100%   Filed Weights   03/12/20 1158  Weight: 139 lb 4.8 oz (63.2 kg)    Due to COVID19 we will limit examination to appearance. Patient had no complaints.  GENERAL:alert, no distress and comfortable SKIN: skin color normal, no rashes or significant lesions EYES: normal, Conjunctiva are pink and non-injected, sclera clear  NEURO: alert & oriented x 3 with fluent speech   LABORATORY DATA:  I have reviewed the data as listed CBC Latest Ref Rng & Units 03/12/2020 03/04/2020 02/26/2020  WBC 4.0 - 10.5 K/uL 4.6 3.9(L) 8.5  Hemoglobin 12.0 - 15.0 g/dL 12.0 12.1 12.5  Hematocrit 36.0 - 46.0 % 37.2 37.6 38.6  Platelets 150 - 400 K/uL 336 99(L) 207     CMP Latest Ref Rng & Units 03/12/2020 03/04/2020 02/26/2020  Glucose 70 - 99 mg/dL 172(H) 176(H) 250(H)  BUN 8 - 23 mg/dL '8 9 9  ' Creatinine 0.44 - 1.00 mg/dL 0.74 0.73 0.81  Sodium 135 - 145 mmol/L 138 137 138  Potassium 3.5 - 5.1 mmol/L 3.3(L) 3.2(L) 3.7  Chloride 98 - 111 mmol/L 100 96(L) 101  CO2 22 - 32 mmol/L '31 31 29  ' Calcium 8.9 - 10.3 mg/dL 9.2 9.2 9.1  Total Protein 6.5 - 8.1 g/dL 6.4(L) 6.7 6.9  Total Bilirubin 0.3 - 1.2 mg/dL 1.6(H) 1.9(H) 1.2  Alkaline Phos 38 - 126 U/L 582(H) 555(H) 505(H)  AST 15 - 41 U/L 65(H) 121(H) 91(H)  ALT 0 - 44 U/L 53(H) 99(H) 78(H)      RADIOGRAPHIC STUDIES: I have personally reviewed the radiological images as listed and agreed with the findings in the report. No results found.   ASSESSMENT & PLAN:  Mariah Henderson is a 70 y.o. female with   1. Pancreaticcancer withLivermetastasis, stage IV -Her CT AP from 01/13/20 showed 2cm mass on tail of pancreas.The mass abuts the posterior gastric wall but without definite gastric invasion.Scan also showsmultiple (at least5)hepatic masses highly suspicious for metastatic lesionsandQuestionable 0.8 by 0.8 cm  omental tumor nodule versus dense diverticulum above the transverse colon. -Her liver biopsy from 01/28/20 showed adenocarcinoma from pancreatic primary. Her 02/11/20 CT Chest  showed no metastasis -I started her on first line chemo with gemcitabine andAbraxane every 2 weeks beginning 02/26/20.  -She tolerated first cycle well. She notes pain improved and appetite increase. Labs reviewed, CBC and CMP WNL except K 3.3, BG 172, protein 6.4, albumin 2.8, AST 65, ALT 53, Alk Phos 582. Overall adequate to proceed with C2 Gem/Abraxane today  -She was recommended for surgery for her cataracts and glaucoma. I discussed given she is on low dose chemo, she can proceed with surgery on 3 weeks off treatment. I will discuss this with patient's doctor.  -F/u in 2 weeks.  -plan to repeat CT scan after cycle 5-6   2. Abdominal Pain, low appetite, weight loss, Transmanitis  -She has had abdominal pain for the past month, 5/10. She was prescribed hydrocodone but did not pick up. She is fine to use OTC pain medication.  -She also has had sudden loss of appetite and weight loss since October 2021. She has overall lost 10 pounds.Her weight continues to trend down. -she missed a call from our dietitian, will reschedule -I encouraged her to drink more Glucerna   3. Comorbidities: Asthma, Borderline Diabetic, GERD, Heart murmur, HLD, HTN, arthritis  -On medications, including multiple diuretics.  -Managed by PCP.  4. Social support -She lives alone in 1 level house and she is retired. She has 1 adult son who lives in [place]. She has a twin and 3 other sisters who live in [place], and other siblings and relative in [place]. -Her son will help her at home  5. Cytopenias  -She developed mild neutropenia and thrombocytopenia, secondary to chemotherapy -Continue monitoring  6. Goal of care discussion  -We again discussed the incurable nature of her cancer, and the overall poor prognosis, especially if she  does not have good response to chemotherapy or progress on chemo -The patient understands the goal of care is palliative. -I recommend DNR/DNI, she will think about it    PLAN: -I refilled Norco today   -Labs reviewed and adequate to proceed with C2 Gem/Abraxane today at same dose  -Lab, flush, f/u and Gem/Abraxane in 2, 4 weeks.    No problem-specific Assessment & Plan notes found for this encounter.   No orders of the defined types were placed in this encounter.  All questions were answered. The patient knows to call the clinic with any problems, questions or concerns. No barriers to learning was detected. The total time spent in the appointment was 30 minutes.     Truitt Merle, MD 03/12/2020   I, Joslyn Devon, am acting as scribe for Truitt Merle, MD.   I have reviewed the above documentation for accuracy and completeness, and I agree with the above.

## 2020-03-12 ENCOUNTER — Encounter: Payer: Self-pay | Admitting: Hematology

## 2020-03-12 ENCOUNTER — Telehealth: Payer: Self-pay | Admitting: Hematology

## 2020-03-12 ENCOUNTER — Inpatient Hospital Stay: Payer: Medicare Other

## 2020-03-12 ENCOUNTER — Inpatient Hospital Stay (HOSPITAL_BASED_OUTPATIENT_CLINIC_OR_DEPARTMENT_OTHER): Payer: Medicare Other | Admitting: Hematology

## 2020-03-12 ENCOUNTER — Other Ambulatory Visit: Payer: Self-pay

## 2020-03-12 VITALS — BP 140/79 | HR 77 | Temp 97.7°F | Resp 16 | Ht 60.0 in | Wt 139.3 lb

## 2020-03-12 DIAGNOSIS — Z7189 Other specified counseling: Secondary | ICD-10-CM

## 2020-03-12 DIAGNOSIS — C252 Malignant neoplasm of tail of pancreas: Secondary | ICD-10-CM

## 2020-03-12 DIAGNOSIS — Z5111 Encounter for antineoplastic chemotherapy: Secondary | ICD-10-CM | POA: Diagnosis not present

## 2020-03-12 DIAGNOSIS — C787 Secondary malignant neoplasm of liver and intrahepatic bile duct: Secondary | ICD-10-CM | POA: Diagnosis not present

## 2020-03-12 LAB — CBC WITH DIFFERENTIAL (CANCER CENTER ONLY)
Abs Immature Granulocytes: 0.04 10*3/uL (ref 0.00–0.07)
Basophils Absolute: 0.1 10*3/uL (ref 0.0–0.1)
Basophils Relative: 2 %
Eosinophils Absolute: 0.1 10*3/uL (ref 0.0–0.5)
Eosinophils Relative: 2 %
HCT: 37.2 % (ref 36.0–46.0)
Hemoglobin: 12 g/dL (ref 12.0–15.0)
Immature Granulocytes: 1 %
Lymphocytes Relative: 10 %
Lymphs Abs: 0.5 10*3/uL — ABNORMAL LOW (ref 0.7–4.0)
MCH: 27.9 pg (ref 26.0–34.0)
MCHC: 32.3 g/dL (ref 30.0–36.0)
MCV: 86.5 fL (ref 80.0–100.0)
Monocytes Absolute: 1.2 10*3/uL — ABNORMAL HIGH (ref 0.1–1.0)
Monocytes Relative: 26 %
Neutro Abs: 2.7 10*3/uL (ref 1.7–7.7)
Neutrophils Relative %: 59 %
Platelet Count: 336 10*3/uL (ref 150–400)
RBC: 4.3 MIL/uL (ref 3.87–5.11)
RDW: 14.8 % (ref 11.5–15.5)
WBC Count: 4.6 10*3/uL (ref 4.0–10.5)
nRBC: 0 % (ref 0.0–0.2)

## 2020-03-12 LAB — CMP (CANCER CENTER ONLY)
ALT: 53 U/L — ABNORMAL HIGH (ref 0–44)
AST: 65 U/L — ABNORMAL HIGH (ref 15–41)
Albumin: 2.8 g/dL — ABNORMAL LOW (ref 3.5–5.0)
Alkaline Phosphatase: 582 U/L — ABNORMAL HIGH (ref 38–126)
Anion gap: 7 (ref 5–15)
BUN: 8 mg/dL (ref 8–23)
CO2: 31 mmol/L (ref 22–32)
Calcium: 9.2 mg/dL (ref 8.9–10.3)
Chloride: 100 mmol/L (ref 98–111)
Creatinine: 0.74 mg/dL (ref 0.44–1.00)
GFR, Estimated: >60 mL/min (ref >60)
Glucose, Bld: 172 mg/dL — ABNORMAL HIGH (ref 70–99)
Potassium: 3.3 mmol/L — ABNORMAL LOW (ref 3.5–5.1)
Sodium: 138 mmol/L (ref 135–145)
Total Bilirubin: 1.6 mg/dL — ABNORMAL HIGH (ref 0.3–1.2)
Total Protein: 6.4 g/dL — ABNORMAL LOW (ref 6.5–8.1)

## 2020-03-12 MED ORDER — HEPARIN SOD (PORK) LOCK FLUSH 100 UNIT/ML IV SOLN
500.0000 [IU] | Freq: Once | INTRAVENOUS | Status: AC | PRN
  Administered 2020-03-12: 500 [IU]
  Filled 2020-03-12: qty 5

## 2020-03-12 MED ORDER — PACLITAXEL PROTEIN-BOUND CHEMO INJECTION 100 MG
100.0000 mg/m2 | Freq: Once | INTRAVENOUS | Status: AC
  Administered 2020-03-12: 175 mg via INTRAVENOUS
  Filled 2020-03-12: qty 35

## 2020-03-12 MED ORDER — SODIUM CHLORIDE 0.9% FLUSH
10.0000 mL | INTRAVENOUS | Status: DC | PRN
  Administered 2020-03-12: 10 mL
  Filled 2020-03-12: qty 10

## 2020-03-12 MED ORDER — PROCHLORPERAZINE MALEATE 10 MG PO TABS
ORAL_TABLET | ORAL | Status: AC
  Filled 2020-03-12: qty 1

## 2020-03-12 MED ORDER — SODIUM CHLORIDE 0.9 % IV SOLN
Freq: Once | INTRAVENOUS | Status: AC
  Filled 2020-03-12: qty 250

## 2020-03-12 MED ORDER — HYDROCODONE-ACETAMINOPHEN 5-325 MG PO TABS: 1.0000 | ORAL_TABLET | Freq: Two times a day (BID) | ORAL | 0 refills | Status: DC | PRN

## 2020-03-12 MED ORDER — SODIUM CHLORIDE 0.9 % IV SOLN
1000.0000 mg/m2 | Freq: Once | INTRAVENOUS | Status: AC
  Administered 2020-03-12: 1672 mg via INTRAVENOUS
  Filled 2020-03-12: qty 43.97

## 2020-03-12 MED ORDER — PROCHLORPERAZINE MALEATE 10 MG PO TABS
10.0000 mg | ORAL_TABLET | Freq: Once | ORAL | Status: AC
  Administered 2020-03-12: 10 mg via ORAL

## 2020-03-12 NOTE — Telephone Encounter (Signed)
Checked out appointment. No LOS notes needing to be scheduled. No changes made. 

## 2020-03-12 NOTE — Patient Instructions (Signed)
[facility] Discharge Instructions for Patients Receiving Chemotherapy  Today you received the following chemotherapy agents Abraxane; Gemzar  To help prevent nausea and vomiting after your treatment, we encourage you to take your nausea medication as directed   If you develop nausea and vomiting that is not controlled by your nausea medication, call the clinic.   BELOW ARE SYMPTOMS THAT SHOULD BE REPORTED IMMEDIATELY:  *FEVER GREATER THAN 100.5 F  *CHILLS WITH OR WITHOUT FEVER  NAUSEA AND VOMITING THAT IS NOT CONTROLLED WITH YOUR NAUSEA MEDICATION  *UNUSUAL SHORTNESS OF BREATH  *UNUSUAL BRUISING OR BLEEDING  TENDERNESS IN MOUTH AND THROAT WITH OR WITHOUT PRESENCE OF ULCERS  *URINARY PROBLEMS  *BOWEL PROBLEMS  UNUSUAL RASH Items with * indicate a potential emergency and should be followed up as soon as possible.  Feel free to call the clinic should you have any questions or concerns. The clinic phone number is [phone].  Please show the [facility] at check-in to the Emergency Department and triage nurse.  Nanoparticle Albumin-Bound Paclitaxel injection What is this medicine? NANOPARTICLE ALBUMIN-BOUND PACLITAXEL (Na no PAHR ti kuhl al BYOO muhn-bound PAK li TAX el) is a chemotherapy drug. It targets fast dividing cells, like cancer cells, and causes these cells to die. This medicine is used to treat advanced breast cancer, lung cancer, and pancreatic cancer. This medicine may be used for other purposes; ask your health care provider or pharmacist if you have questions. COMMON BRAND NAME(S): Abraxane What should I tell my health care provider before I take this medicine? They need to know if you have any of these conditions:  kidney disease  liver disease  low blood counts, like low white cell, platelet, or red cell counts  lung or breathing disease, like asthma  tingling of the fingers or toes, or other nerve disorder  an unusual or  allergic reaction to paclitaxel, albumin, other chemotherapy, other medicines, foods, dyes, or preservatives  pregnant or trying to get pregnant  breast-feeding How should I use this medicine? This drug is given as an infusion into a vein. It is administered in a hospital or clinic by a specially trained health care professional. Talk to your pediatrician regarding the use of this medicine in children. Special care may be needed. Overdosage: If you think you have taken too much of this medicine contact a poison control center or emergency room at once. NOTE: This medicine is only for you. Do not share this medicine with others. What if I miss a dose? It is important not to miss your dose. Call your doctor or health care professional if you are unable to keep an appointment. What may interact with this medicine? This medicine may interact with the following medications:  antiviral medicines for hepatitis, HIV or AIDS  certain antibiotics like erythromycin and clarithromycin  certain medicines for fungal infections like ketoconazole and itraconazole  certain medicines for seizures like carbamazepine, phenobarbital, phenytoin  gemfibrozil  nefazodone  rifampin  St. John's wort This list may not describe all possible interactions. Give your health care provider a list of all the medicines, herbs, non-prescription drugs, or dietary supplements you use. Also tell them if you smoke, drink alcohol, or use illegal drugs. Some items may interact with your medicine. What should I watch for while using this medicine? Your condition will be monitored carefully while you are receiving this medicine. You will need important blood work done while you are taking this medicine. This medicine can cause serious  allergic reactions. If you experience allergic reactions like skin rash, itching or hives, swelling of the face, lips, or tongue, tell your doctor or health care professional right away. In some  cases, you may be given additional medicines to help with side effects. Follow all directions for their use. This drug may make you feel generally unwell. This is not uncommon, as chemotherapy can affect healthy cells as well as cancer cells. Report any side effects. Continue your course of treatment even though you feel ill unless your doctor tells you to stop. Call your doctor or health care professional for advice if you get a fever, chills or sore throat, or other symptoms of a cold or flu. Do not treat yourself. This drug decreases your body's ability to fight infections. Try to avoid being around people who are sick. This medicine may increase your risk to bruise or bleed. Call your doctor or health care professional if you notice any unusual bleeding. Be careful brushing and flossing your teeth or using a toothpick because you may get an infection or bleed more easily. If you have any dental work done, tell your dentist you are receiving this medicine. Avoid taking products that contain aspirin, acetaminophen, ibuprofen, naproxen, or ketoprofen unless instructed by your doctor. These medicines may hide a fever. Do not become pregnant while taking this medicine or for 6 months after stopping it. Women should inform their doctor if they wish to become pregnant or think they might be pregnant. Men should not father a child while taking this medicine or for 3 months after stopping it. There is a potential for serious side effects to an unborn child. Talk to your health care professional or pharmacist for more information. Do not breast-feed an infant while taking this medicine or for 2 weeks after stopping it. This medicine may interfere with the ability to get pregnant or to father a child. You should talk to your doctor or health care professional if you are concerned about your fertility. What side effects may I notice from receiving this medicine? Side effects that you should report to your doctor  or health care professional as soon as possible:  allergic reactions like skin rash, itching or hives, swelling of the face, lips, or tongue  breathing problems  changes in vision  fast, irregular heartbeat  low blood pressure  mouth sores  pain, tingling, numbness in the hands or feet  signs of decreased platelets or bleeding - bruising, pinpoint red spots on the skin, black, tarry stools, blood in the urine  signs of decreased red blood cells - unusually weak or tired, feeling faint or lightheaded, falls  signs of infection - fever or chills, cough, sore throat, pain or difficulty passing urine  signs and symptoms of liver injury like dark yellow or brown urine; general ill feeling or flu-like symptoms; light-colored stools; loss of appetite; nausea; right upper belly pain; unusually weak or tired; yellowing of the eyes or skin  swelling of the ankles, feet, hands  unusually slow heartbeat Side effects that usually do not require medical attention (report to your doctor or health care professional if they continue or are bothersome):  diarrhea  hair loss  loss of appetite  nausea, vomiting  tiredness This list may not describe all possible side effects. Call your doctor for medical advice about side effects. You may report side effects to FDA at 1-800-FDA-1088. Where should I keep my medicine? This drug is given in a hospital or clinic and will  not be stored at home. NOTE: This sheet is a summary. It may not cover all possible information. If you have questions about this medicine, talk to your doctor, pharmacist, or health care provider.  2021 [employer] Standard (2016-09-05 13:03:45)  Gemcitabine injection What is this medicine? GEMCITABINE (jem SYE ta been) is a chemotherapy drug. This medicine is used to treat many types of cancer like breast cancer, lung cancer, pancreatic cancer, and ovarian cancer. This medicine may be used for other purposes; ask your  health care provider or pharmacist if you have questions. COMMON BRAND NAME(S): Gemzar, Infugem What should I tell my health care provider before I take this medicine? They need to know if you have any of these conditions:  blood disorders  infection  kidney disease  liver disease  lung or breathing disease, like asthma  recent or ongoing radiation therapy  an unusual or allergic reaction to gemcitabine, other chemotherapy, other medicines, foods, dyes, or preservatives  pregnant or trying to get pregnant  breast-feeding How should I use this medicine? This drug is given as an infusion into a vein. It is administered in a hospital or clinic by a specially trained health care professional. Talk to your pediatrician regarding the use of this medicine in children. Special care may be needed. Overdosage: If you think you have taken too much of this medicine contact a poison control center or emergency room at once. NOTE: This medicine is only for you. Do not share this medicine with others. What if I miss a dose? It is important not to miss your dose. Call your doctor or health care professional if you are unable to keep an appointment. What may interact with this medicine?  medicines to increase blood counts like filgrastim, pegfilgrastim, sargramostim  some other chemotherapy drugs like cisplatin  vaccines Talk to your doctor or health care professional before taking any of these medicines:  acetaminophen  aspirin  ibuprofen  ketoprofen  naproxen This list may not describe all possible interactions. Give your health care provider a list of all the medicines, herbs, non-prescription drugs, or dietary supplements you use. Also tell them if you smoke, drink alcohol, or use illegal drugs. Some items may interact with your medicine. What should I watch for while using this medicine? Visit your doctor for checks on your progress. This drug may make you feel generally unwell.  This is not uncommon, as chemotherapy can affect healthy cells as well as cancer cells. Report any side effects. Continue your course of treatment even though you feel ill unless your doctor tells you to stop. In some cases, you may be given additional medicines to help with side effects. Follow all directions for their use. Call your doctor or health care professional for advice if you get a fever, chills or sore throat, or other symptoms of a cold or flu. Do not treat yourself. This drug decreases your body's ability to fight infections. Try to avoid being around people who are sick. This medicine may increase your risk to bruise or bleed. Call your doctor or health care professional if you notice any unusual bleeding. Be careful brushing and flossing your teeth or using a toothpick because you may get an infection or bleed more easily. If you have any dental work done, tell your dentist you are receiving this medicine. Avoid taking products that contain aspirin, acetaminophen, ibuprofen, naproxen, or ketoprofen unless instructed by your doctor. These medicines may hide a fever. Do not become pregnant while taking this  medicine or for 6 months after stopping it. Women should inform their doctor if they wish to become pregnant or think they might be pregnant. Men should not father a child while taking this medicine and for 3 months after stopping it. There is a potential for serious side effects to an unborn child. Talk to your health care professional or pharmacist for more information. Do not breast-feed an infant while taking this medicine or for at least 1 week after stopping it. Men should inform their doctors if they wish to father a child. This medicine may lower sperm counts. Talk with your doctor or health care professional if you are concerned about your fertility. What side effects may I notice from receiving this medicine? Side effects that you should report to your doctor or health care  professional as soon as possible:  allergic reactions like skin rash, itching or hives, swelling of the face, lips, or tongue  breathing problems  pain, redness, or irritation at site where injected  signs and symptoms of a dangerous change in heartbeat or heart rhythm like chest pain; dizziness; fast or irregular heartbeat; palpitations; feeling faint or lightheaded, falls; breathing problems  signs of decreased platelets or bleeding - bruising, pinpoint red spots on the skin, black, tarry stools, blood in the urine  signs of decreased red blood cells - unusually weak or tired, feeling faint or lightheaded, falls  signs of infection - fever or chills, cough, sore throat, pain or difficulty passing urine  signs and symptoms of kidney injury like trouble passing urine or change in the amount of urine  signs and symptoms of liver injury like dark yellow or brown urine; general ill feeling or flu-like symptoms; light-colored stools; loss of appetite; nausea; right upper belly pain; unusually weak or tired; yellowing of the eyes or skin  swelling of ankles, feet, hands Side effects that usually do not require medical attention (report to your doctor or health care professional if they continue or are bothersome):  constipation  diarrhea  hair loss  loss of appetite  nausea  rash  vomiting This list may not describe all possible side effects. Call your doctor for medical advice about side effects. You may report side effects to FDA at 1-800-FDA-1088. Where should I keep my medicine? This drug is given in a hospital or clinic and will not be stored at home. NOTE: This sheet is a summary. It may not cover all possible information. If you have questions about this medicine, talk to your doctor, pharmacist, or health care provider.  2021 [employer]/Gold Standard (2017-03-28 18:06:11)

## 2020-03-12 NOTE — Progress Notes (Signed)
Ok to treat with total bili of 1.6 per Dr. Feng. °

## 2020-03-14 ENCOUNTER — Other Ambulatory Visit: Payer: Self-pay | Admitting: Internal Medicine

## 2020-03-14 DIAGNOSIS — J4540 Moderate persistent asthma, uncomplicated: Secondary | ICD-10-CM

## 2020-03-16 ENCOUNTER — Other Ambulatory Visit: Payer: Self-pay | Admitting: Internal Medicine

## 2020-03-16 DIAGNOSIS — M159 Polyosteoarthritis, unspecified: Secondary | ICD-10-CM

## 2020-03-16 DIAGNOSIS — M8949 Other hypertrophic osteoarthropathy, multiple sites: Secondary | ICD-10-CM

## 2020-03-16 DIAGNOSIS — G8929 Other chronic pain: Secondary | ICD-10-CM

## 2020-03-16 DIAGNOSIS — M25551 Pain in right hip: Secondary | ICD-10-CM

## 2020-03-19 NOTE — Progress Notes (Signed)
Mariah Henderson   [phone] [phone]   Clinic Follow up Note   Patient Care Team: Sid Falcon, MD as PCP - General (Internal Medicine) Forrest Moron, [employer] (Optometry) Jonnie Finner, RN as Oncology Nurse Navigator Truitt Merle, MD as Consulting Physician (Oncology) Gwyndolyn Kaufman, RN as Registered Nurse  Date of Service:  03/24/2020  CHIEF COMPLAINT: F/u Pancreaticcancer withliver metastasis  SUMMARY OF ONCOLOGIC HISTORY: Oncology History Overview Note  Cancer Staging Pancreatic cancer [facility]) Staging form: Exocrine Pancreas, AJCC 8th Edition - Clinical: Stage IV (cT1c, cN0, pM1) - Signed by Truitt Merle, MD on 02/26/2020 Total positive nodes: 0    Pancreatic cancer ([place])  01/13/2020 Imaging   US Abdomen 01/13/20  IMPRESSION: 1. Suggestion of 3 vascular hypoechoic mass within the liver measuring up to 2.5 cm. 2. Suggestion of a hypoechoic mass within the tail of the pancreas that is not well visualized. 3. Findings concerning for malignancy, please see separately dictated CT abdomen pelvis 01/13/2020.   01/13/2020 Imaging   CT AP 01/13/20  IMPRESSION: 1. Hypoenhancing 2.0 cm in long axis mass in the pancreatic tail compatible with pancreatic adenocarcinoma. The mass abuts the posterior gastric wall but without definite gastric invasion. The splenic vein appears narrowed/attenuated compared to prior exams and the lesion abuts the margin of the splenic artery. 2. Hypoenhancing hepatic masses are new compared to 11/22/2017 and are highly suspicious for metastatic lesions. 3. Questionable 0.8 by 0.8 cm omental tumor nodule versus dense diverticulum above the transverse colon. 4. Other imaging findings of potential clinical significance: Mild distal esophageal wall thickening, query esophagitis. Stable left adrenal adenoma. Sigmoid colon diverticulosis. Lumbar and thoracic spondylosis and degenerative disc disease contributing to  multilevel impingement. 5. Aortic atherosclerosis. Aortic Atherosclerosis (ICD10-I70.0).   01/21/2020 Initial Diagnosis   Pancreatic cancer ([place])   01/28/2020 Initial Biopsy   FINAL MICROSCOPIC DIAGNOSIS:   A. LIVER, NEEDLE CORE BIOPSY:  - Adenocarcinoma.   COMMENT:   Immunohistochemistry for CK7 is positive.  CDX-2 demonstrates weak to  moderate positive staining.  TTF-1 and PAX 8 demonstrate weak, likely  nonspecific staining.  CK20, GATA-3 and ER are negative.  The  morphologic and immunophenotypic characteristics are compatible with the  clinical impression of a primary pancreatic cancer.  Radiologic  correlation is encouraged.  If applicable, there is likely sufficient  tissue for ancillary studies (Block A2).  Dr. Vic Ripper reviewed the  case.    02/26/2020 -  Chemotherapy   First-line Gemcitabine and Abraxane every 2 weeks starting 02/26/20    02/26/2020 Cancer Staging   Staging form: Exocrine Pancreas, AJCC 8th Edition - Clinical: Stage IV (cT1c, cN0, pM1) - Signed by Truitt Merle, MD on 02/26/2020 Total positive nodes: 0   03/08/2020 Genetic Testing   Negative hereditary cancer genetic testing: no pathogenic variants detected in [facility] Common Hereditary Cancers Panel with preliminary evidence pancreatic cancer genes.  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 The report date is March 08, 2020.    The Common Hereditary Cancers Panel with preliminary evidence pancreatic cancer genes offered by [facility] includes sequencing and/or deletion duplication testing of the following 49 genes: APC, ATM, AXIN2, BARD1, BMPR1A, BRCA1, BRCA2, BRIP1, CDH1, CDK4, CDKN2A (p14ARF), CDKN2A (p16INK4a), CHEK2, CTNNA1, DICER1, EPCAM (Deletion/duplication testing only), GREM1 (promoter region deletion/duplication testing only), FANCC, GREM1, HOXB13, KIT, MEN1, MLH1, MSH2, MSH3, MSH6, MUTYH, NBN, NF1, NHTL1, PALB2, PALLD, PDGFRA, PMS2, POLD1, POLE, PTEN, RAD50, RAD51C,  RAD51D, SDHA, SDHB, SDHC, SDHD, SMAD4, SMARCA4.  STK11, TP53, TSC1, TSC2, and VHL.  The following genes were evaluated for sequence changes only: SDHA and HOXB13 c.251G>A variant only.es only: SDHA and HOXB13 c.251G>A variant only.      CURRENT THERAPY:  First-line Gemcitabine and Abraxane every 2 weeks starting2/10/22   INTERVAL HISTORY:  Mariah Henderson is here for a follow up. She presents to the clinic alone with her sister. She notes she is doing well. She notes her appetite is increasing. She is eating better. Her sister notes mild weakness in the morning but able to get things done. She notes she is still able to live alone and get help from her sister who lives 10 minutes away. She notes mid abdominal pain mainly when she lays down. She notes this does not wake her up. She notes she has been taking Norco. She denies constipation from this. She notes she is drinking water adequately. She has grant through Cancer center but not sure of the name. Her sister will obtain the name.    REVIEW OF SYSTEMS:   Constitutional: Denies fevers, chills or abnormal weight loss Eyes: Denies blurriness of vision Ears, nose, mouth, throat, and face: Denies mucositis or sore throat Respiratory: Denies cough, dyspnea or wheezes Cardiovascular: Denies palpitation, chest discomfort or lower extremity swelling Gastrointestinal:  Denies nausea, heartburn or change in bowel habits (+) Mid abdominal pain  Skin: Denies abnormal skin rashes Lymphatics: Denies new lymphadenopathy or easy bruising Neurological:Denies numbness, tingling or new weaknesses Behavioral/Psych: Mood is stable, no new changes  All other systems were reviewed with the patient and are negative.  MEDICAL HISTORY:  Past Medical History:  Diagnosis Date  . Acute medial meniscus tear    right knee  . Anxiety   . Asthma   . Cervical spondylosis   . Chronic serous otitis media   . Constipation   . COPD (chronic obstructive pulmonary  disease) ([place])   . Depression    followed by [name]; no longer of depakote, seroquel  . Diabetes mellitus without complication ([place])   . Dysuria 12/27/2009   Qualifier: Diagnosis of  By: [name] MD, Amanjot    . Foot drop   . GERD (gastroesophageal reflux disease)   . Heart murmur   . Hyperlipidemia   . Hypertension   . Low back pain   . Obesity   . Paraumbilical hernia   . Pre-diabetes     SURGICAL HISTORY: Past Surgical History:  Procedure Laterality Date  . ABDOMINAL HYSTERECTOMY    . ANTERIOR CERVICAL DECOMP/DISCECTOMY FUSION N/A 10/05/2014   Procedure: ACDF - C5-C6 - C6-C7;  Surgeon: Karie Chimera, MD;  Location: [place] [facility];  Service: Neurosurgery;  Laterality: N/A;  ACDF - C5-C6 - C6-C7  . CHOLECYSTECTOMY    . COLONOSCOPY    . FOOT SURGERY    . HERNIA REPAIR  1999  . IR IMAGING GUIDED PORT INSERTION  02/25/2020  . IR US GUIDANCE  01/28/2020  . KNEE ARTHROSCOPY WITH MEDIAL MENISECTOMY Right 06/14/2016   Procedure: RIGHT KNEE ARTHROSCOPY WITH PARTIAL MEDIAL MENISCECTOMY, SUBCHONDROPLASTY;  Surgeon: Leandrew Koyanagi, MD;  Location: [facility];  Service: Orthopedics;  Laterality: Right;  . KNEE ARTHROSCOPY WITH SUBCHONDROPLASTY Right 06/14/2016   Procedure: KNEE ARTHROSCOPY WITH SUBCHONDROPLASTY;  Surgeon: Leandrew Koyanagi, MD;  Location: [facility];  Service: Orthopedics;  Laterality: Right;  . RADIOACTIVE SEED GUIDED EXCISIONAL BREAST BIOPSY Left 06/23/2014   Procedure: RADIOACTIVE SEED GUIDED EXCISIONAL BREAST BIOPSY;  Surgeon: Stark Klein, MD;  Location:  [facility];  Service: General;  Laterality: Left;    I have reviewed the social history and family history with the patient and they are unchanged from previous note.  ALLERGIES:  has No Known Allergies.  MEDICATIONS:  Current Outpatient Medications  Medication Sig Dispense Refill  . potassium chloride (KLOR-CON) 20 MEQ packet Take 20 mEq by mouth 2 (two) times daily. 60 packet 1   . albuterol (PROVENTIL) (2.5 MG/3ML) 0.083% nebulizer solution Take 3 mLs (2.5 mg total) by nebulization every 6 (six) hours as needed for wheezing. 1080 mL 3  . albuterol (VENTOLIN HFA) 108 (90 Base) MCG/ACT inhaler INHALE 1 TO 2 PUFFS INTO THE LUNGS EVERY 6 HOURS AS NEEDED FOR WHEEZING OR SHORTNESS OF BREATH 54 g 1  . alendronate (FOSAMAX) 70 MG tablet TAKE 1 TABLET BY MOUTH EVERY 7 DAYS. TAKE WITH A FULL GLASS OF WATER ON AN EMPTY STOMACH. 12 tablet 3  . atorvastatin (LIPITOR) 40 MG tablet Take 1 tablet (40 mg total) by mouth daily. 90 tablet 3  . benazepril (LOTENSIN) 20 MG tablet TAKE 1 TABLET(20 MG) BY MOUTH DAILY 90 tablet 3  . calcium carbonate (OS-CAL) 600 MG TABS tablet Take by mouth.    . celecoxib (CELEBREX) 100 MG capsule TAKE 1 CAPSULE BY MOUTH  TWICE DAILY 180 capsule 3  . cholecalciferol (VITAMIN D) 1000 units tablet Take 2,000 Units by mouth daily.    . diclofenac Sodium (VOLTAREN) 1 % GEL Apply 2 g topically 4 (four) times daily. 100 g 2  . famotidine (PEPCID) 40 MG tablet TAKE 1/2 TABLET(20 MG) BY MOUTH TWICE DAILY 30 tablet 3  . fluticasone (FLONASE) 50 MCG/ACT nasal spray Place 1 spray into both nostrils daily. 16 g 3  . Fluticasone-Salmeterol (ADVAIR DISKUS) 100-50 MCG/DOSE AEPB Inhale 1 puff into the lungs 2 (two) times daily. 60 each 11  . furosemide (LASIX) 20 MG tablet take 1 tablet by mouth once daily 30 tablet 11  . glipiZIDE (GLUCOTROL XL) 2.5 MG 24 hr tablet Take 1 tablet (2.5 mg total) by mouth daily with breakfast. 30 tablet 2  . hydrochlorothiazide (MICROZIDE) 12.5 MG capsule TAKE 1 CAPSULE(12.5 MG) BY MOUTH DAILY 90 capsule 1  . HYDROcodone-acetaminophen (NORCO/VICODIN) 5-325 MG tablet Take 1 tablet by mouth 2 (two) times daily as needed for severe pain. 60 tablet 0  . ipratropium (ATROVENT) 0.02 % nebulizer solution Take 0.5 mg by nebulization every 6 (six) hours as needed for wheezing or shortness of breath.    . latanoprost (XALATAN) 0.005 % ophthalmic solution  PLACE 1 DROP INTO BOTH EYES AT BEDTIME  0  . lidocaine-prilocaine (EMLA) cream Apply to affected area once 30 g 3  . loperamide (LOPERAMIDE A-D) 2 MG tablet Take 1 tablet (2 mg total) by mouth 2 (two) times daily as needed for diarrhea or loose stools. 6 tablet 0  . loratadine (CLARITIN) 10 MG tablet Take 1 tablet (10 mg total) by mouth daily. 30 tablet 11  . meclizine (ANTIVERT) 12.5 MG tablet TAKE 1 TABLET(12.5 MG) BY MOUTH TWICE DAILY AS NEEDED FOR DIZZINESS 15 tablet 3  . ondansetron (ZOFRAN) 4 MG tablet Take 1-2 tablets (4-8 mg total) by mouth every 8 (eight) hours as needed for nausea or vomiting. 15 tablet 0  . ondansetron (ZOFRAN) 8 MG tablet Take 1 tablet (8 mg total) by mouth 2 (two) times daily as needed (Nausea or vomiting). 30 tablet 1  . ONETOUCH ULTRA test strip USE TO TEST BLOOD SUGAR  ONCE  DAILY 50 strip 6  . prochlorperazine (COMPAZINE) 10 MG tablet Take 1 tablet (10 mg total) by mouth every 6 (six) hours as needed (Nausea or vomiting). 30 tablet 1  . risperiDONE (RISPERDAL) 0.25 MG tablet Take 0.25 mg by mouth daily as needed.    . sitaGLIPtin (JANUVIA) 25 MG tablet Take 1 tablet (25 mg total) by mouth daily. 90 tablet 1  . traZODone (DESYREL) 50 MG tablet Take 25-50 mg by mouth at bedtime as needed for sleep.     Marland Kitchen triamcinolone cream (KENALOG) 0.1 % apply to affected area twice a day 454 g 1   No current facility-administered medications for this visit.   Facility-Administered Medications Ordered in Other Visits  Medication Dose Route Frequency Provider Last Rate Last Admin  . gemcitabine (GEMZAR) 1,672 mg in sodium chloride 0.9 % 250 mL chemo infusion  1,000 mg/m2 (Treatment Plan Recorded) Intravenous Once Truitt Merle, MD      . heparin lock flush 100 unit/mL  500 Units Intracatheter Once PRN Truitt Merle, MD      . PACLitaxel-protein bound (ABRAXANE) chemo infusion 175 mg  100 mg/m2 (Treatment Plan Recorded) Intravenous Once Truitt Merle, MD      . potassium chloride 10 mEq in 100  mL IVPB  10 mEq Intravenous Q1 Hr x 2 Truitt Merle, MD 100 mL/hr at 03/24/20 1501 10 mEq at 03/24/20 1501  . sodium chloride flush (NS) 0.9 % injection 10 mL  10 mL Intracatheter PRN Truitt Merle, MD        PHYSICAL EXAMINATION: ECOG PERFORMANCE STATUS: 1 - Symptomatic but completely ambulatory  There were no vitals filed for this visit. There were no vitals filed for this visit.  Due to [place] we will limit examination to appearance. Patient had no complaints.  GENERAL:alert, no distress and comfortable SKIN: skin color normal, no rashes or significant lesions EYES: normal, Conjunctiva are pink and non-injected, sclera clear  NEURO: alert & oriented x 3 with fluent speech   LABORATORY DATA:  I have reviewed the data as listed CBC Latest Ref Rng & Units 03/24/2020 03/12/2020 03/04/2020  WBC 4.0 - 10.5 K/uL 3.7(L) 4.6 3.9(L)  Hemoglobin 12.0 - 15.0 g/dL 11.4(L) 12.0 12.1  Hematocrit 36.0 - 46.0 % 35.4(L) 37.2 37.6  Platelets 150 - 400 K/uL 253 336 99(L)     CMP Latest Ref Rng & Units 03/24/2020 03/12/2020 03/04/2020  Glucose 70 - 99 mg/dL 155(H) 172(H) 176(H)  BUN 8 - 23 mg/dL 7(L) 8 9  Creatinine 0.44 - 1.00 mg/dL 0.74 0.74 0.73  Sodium 135 - 145 mmol/L 142 138 137  Potassium 3.5 - 5.1 mmol/L 2.9(L) 3.3(L) 3.2(L)  Chloride 98 - 111 mmol/L 99 100 96(L)  CO2 22 - 32 mmol/L 34(H) 31 31  Calcium 8.9 - 10.3 mg/dL 9.3 9.2 9.2  Total Protein 6.5 - 8.1 g/dL 6.5 6.4(L) 6.7  Total Bilirubin 0.3 - 1.2 mg/dL 1.1 1.6(H) 1.9(H)  Alkaline Phos 38 - 126 U/L 591(H) 582(H) 555(H)  AST 15 - 41 U/L 68(H) 65(H) 121(H)  ALT 0 - 44 U/L 50(H) 53(H) 99(H)      RADIOGRAPHIC STUDIES: I have personally reviewed the radiological images as listed and agreed with the findings in the report. No results found.   ASSESSMENT & PLAN:  Mariah Henderson is a 70 y.o. female with   1. Pancreaticcancer withLivermetastasis, stage IV -Her CT AP from 01/13/20 showed 2cm mass on tail of pancreas.The mass abuts the  posterior gastric wall but without definite  gastric invasion.Scan also showsmultiple (at least5)hepatic masses highly suspicious for metastatic lesionsandQuestionable 0.8 by 0.8 cm omental tumor nodule versus dense diverticulum above the transverse colon. -Herliver biopsy from 01/28/20 showed adenocarcinoma from pancreatic primary. Her 02/11/20 CT Chestshowed no metastasis -I started her on first linechemo with gemcitabine andAbraxaneevery 2 weeks beginning 02/26/20. Plan to repeat CT scan after cycle 5-6 -S/p C2 she is tolerating moderately well. She is able to eat more and weight is stable. Labs reviewed, CBC and CMP WNL except WBC 3.7, Hg 11.4, K 2.9, BG 155, BUN 7, Albumin 2.8, AST 68, ALT 50, alk Phos 591. Overall adequate to proceed with C3 Gem/Abraxane today at same dose.  -F/u in 2 weeks   2. Abdominal Pain, low appetite, weight loss, Transmanitis  -She has had abdominal pain for the past month, 5/10. She is on Norco 5-385m BID as needed.  -She had sudden loss of appetite and weight loss since October 2021. She has overall lost 10 pounds.Her weight continues to trend down. -On Chemo her appetite has been able to increase and eating more. Her weight is currently stable.  -I encouraged her to drink more Glucerna  3. Comorbidities: Asthma, Borderline Diabetic, GERD, Heart murmur, HLD, HTN, arthritis  -On medications, including multiple diuretics.  -Managed by PCP.  4. Social support -She lives alone in 1 level house and she is retired. She has 1 adult son who lives in [place] She has a twin and 3 other sisters who live in [place] and other siblings and relative in [place] -Her son will help her at home -She has approved for grant to help with medication. Her sister will notify me of the type of grant.   5. Cytopenias  -She developed mild neutropenia and thrombocytopenia, secondary to chemotherapy -Thrombocytopenia has resolved after C2, but has developed mild anemia  with Hg 11.4.   6.Goal of care discussion  -The patient understands the goal of care is palliative. -I recommend DNR/DNI, she will think about it  7. Hypokalemia  -K at 2.9 today (03/24/20). She is on Lasix and dieretics for her LE edema and heart.  -Will give 20 Meq infusion today. I will prescribed her Oral potassium to take BID until her next visit.  -Continue 35-40 ounces of water daily.   8. Genetic testing from 02/19/20 was negative for pathogenetic mutation with VUS of gene NF1  PLAN: -I refilled Norco today and called in oral potassium 219m BID until next visit.  -Labs reviewed and adequate to proceed with C3 Gem/Abraxane today at same dose  -Will give IV Potassium 2068mtoday  -Lab, flush, f/u and Gem/Abraxane in 2, 4 weeks.    No problem-specific Assessment & Plan notes found for this encounter.   No orders of the defined types were placed in this encounter.  All questions were answered. The patient knows to call the clinic with any problems, questions or concerns. No barriers to learning was detected. The total time spent in the appointment was 30 minutes.     YanTruitt MerleD 03/24/2020   I, AmoJoslyn Devonm acting as scribe for YanTruitt MerleD.   I have reviewed the above documentation for accuracy and completeness, and I agree with the above.

## 2020-03-19 NOTE — Telephone Encounter (Signed)
Contacted patient in attempt to disclose results of genetic testing.  LVM with contact information requesting a call back.  Second attempt.  

## 2020-03-24 ENCOUNTER — Inpatient Hospital Stay: Payer: Medicare Other | Attending: Hematology

## 2020-03-24 ENCOUNTER — Other Ambulatory Visit: Payer: Self-pay | Admitting: Hematology

## 2020-03-24 ENCOUNTER — Encounter: Payer: Self-pay | Admitting: Hematology

## 2020-03-24 ENCOUNTER — Inpatient Hospital Stay (HOSPITAL_BASED_OUTPATIENT_CLINIC_OR_DEPARTMENT_OTHER): Payer: Medicare Other | Admitting: Hematology

## 2020-03-24 ENCOUNTER — Inpatient Hospital Stay: Payer: Medicare Other

## 2020-03-24 ENCOUNTER — Other Ambulatory Visit: Payer: Self-pay

## 2020-03-24 VITALS — BP 149/74 | HR 77 | Temp 98.0°F | Resp 18 | Wt 139.8 lb

## 2020-03-24 DIAGNOSIS — Z95828 Presence of other vascular implants and grafts: Secondary | ICD-10-CM

## 2020-03-24 DIAGNOSIS — C252 Malignant neoplasm of tail of pancreas: Secondary | ICD-10-CM | POA: Insufficient documentation

## 2020-03-24 DIAGNOSIS — C787 Secondary malignant neoplasm of liver and intrahepatic bile duct: Secondary | ICD-10-CM | POA: Insufficient documentation

## 2020-03-24 DIAGNOSIS — E876 Hypokalemia: Secondary | ICD-10-CM

## 2020-03-24 DIAGNOSIS — Z5111 Encounter for antineoplastic chemotherapy: Secondary | ICD-10-CM | POA: Insufficient documentation

## 2020-03-24 DIAGNOSIS — Z7189 Other specified counseling: Secondary | ICD-10-CM

## 2020-03-24 LAB — CMP (CANCER CENTER ONLY)
ALT: 50 U/L — ABNORMAL HIGH (ref 0–44)
AST: 68 U/L — ABNORMAL HIGH (ref 15–41)
Albumin: 2.8 g/dL — ABNORMAL LOW (ref 3.5–5.0)
Alkaline Phosphatase: 591 U/L — ABNORMAL HIGH (ref 38–126)
Anion gap: 9 (ref 5–15)
BUN: 7 mg/dL — ABNORMAL LOW (ref 8–23)
CO2: 34 mmol/L — ABNORMAL HIGH (ref 22–32)
Calcium: 9.3 mg/dL (ref 8.9–10.3)
Chloride: 99 mmol/L (ref 98–111)
Creatinine: 0.74 mg/dL (ref 0.44–1.00)
GFR, Estimated: >60 mL/min (ref >60)
Glucose, Bld: 155 mg/dL — ABNORMAL HIGH (ref 70–99)
Potassium: 2.9 mmol/L — ABNORMAL LOW (ref 3.5–5.1)
Sodium: 142 mmol/L (ref 135–145)
Total Bilirubin: 1.1 mg/dL (ref 0.3–1.2)
Total Protein: 6.5 g/dL (ref 6.5–8.1)

## 2020-03-24 LAB — CBC WITH DIFFERENTIAL (CANCER CENTER ONLY)
Abs Immature Granulocytes: 0.03 10*3/uL (ref 0.00–0.07)
Basophils Absolute: 0.1 10*3/uL (ref 0.0–0.1)
Basophils Relative: 2 %
Eosinophils Absolute: 0.3 10*3/uL (ref 0.0–0.5)
Eosinophils Relative: 7 %
HCT: 35.4 % — ABNORMAL LOW (ref 36.0–46.0)
Hemoglobin: 11.4 g/dL — ABNORMAL LOW (ref 12.0–15.0)
Immature Granulocytes: 1 %
Lymphocytes Relative: 14 %
Lymphs Abs: 0.5 10*3/uL — ABNORMAL LOW (ref 0.7–4.0)
MCH: 27.9 pg (ref 26.0–34.0)
MCHC: 32.2 g/dL (ref 30.0–36.0)
MCV: 86.6 fL (ref 80.0–100.0)
Monocytes Absolute: 1.2 10*3/uL — ABNORMAL HIGH (ref 0.1–1.0)
Monocytes Relative: 32 %
Neutro Abs: 1.7 10*3/uL (ref 1.7–7.7)
Neutrophils Relative %: 45 %
Platelet Count: 253 10*3/uL (ref 150–400)
RBC: 4.09 MIL/uL (ref 3.87–5.11)
RDW: 15.1 % (ref 11.5–15.5)
WBC Count: 3.7 10*3/uL — ABNORMAL LOW (ref 4.0–10.5)
nRBC: 0 /100 WBC

## 2020-03-24 MED ORDER — HEPARIN SOD (PORK) LOCK FLUSH 100 UNIT/ML IV SOLN
500.0000 [IU] | Freq: Once | INTRAVENOUS | Status: AC | PRN
  Administered 2020-03-24: 500 [IU]
  Filled 2020-03-24: qty 5

## 2020-03-24 MED ORDER — POTASSIUM CHLORIDE 10 MEQ/100ML IV SOLN
INTRAVENOUS | Status: AC
  Filled 2020-03-24: qty 200

## 2020-03-24 MED ORDER — HYDROCODONE-ACETAMINOPHEN 5-325 MG PO TABS: 1.0000 | ORAL_TABLET | Freq: Two times a day (BID) | ORAL | 0 refills | Status: DC | PRN

## 2020-03-24 MED ORDER — POTASSIUM CHLORIDE 20 MEQ PO PACK: 20.0000 meq | PACK | Freq: Two times a day (BID) | ORAL | 1 refills | Status: DC

## 2020-03-24 MED ORDER — PROCHLORPERAZINE MALEATE 10 MG PO TABS
ORAL_TABLET | ORAL | Status: AC
  Filled 2020-03-24: qty 1

## 2020-03-24 MED ORDER — POTASSIUM CHLORIDE CRYS ER 20 MEQ PO TBCR
10.0000 meq | EXTENDED_RELEASE_TABLET | Freq: Once | ORAL | Status: AC
  Administered 2020-03-24: 10 meq via ORAL

## 2020-03-24 MED ORDER — SODIUM CHLORIDE 0.9% FLUSH
10.0000 mL | Freq: Once | INTRAVENOUS | Status: AC
  Administered 2020-03-24: 10 mL
  Filled 2020-03-24: qty 10

## 2020-03-24 MED ORDER — PACLITAXEL PROTEIN-BOUND CHEMO INJECTION 100 MG
100.0000 mg/m2 | Freq: Once | INTRAVENOUS | Status: AC
  Administered 2020-03-24: 175 mg via INTRAVENOUS
  Filled 2020-03-24: qty 35

## 2020-03-24 MED ORDER — POTASSIUM CHLORIDE 20 MEQ/100ML IV SOLN: 20.0000 meq | Freq: Once | INTRAVENOUS | Status: DC

## 2020-03-24 MED ORDER — SODIUM CHLORIDE 0.9% FLUSH
10.0000 mL | INTRAVENOUS | Status: DC | PRN
  Administered 2020-03-24: 10 mL
  Filled 2020-03-24: qty 10

## 2020-03-24 MED ORDER — SODIUM CHLORIDE 0.9 % IV SOLN
1000.0000 mg/m2 | Freq: Once | INTRAVENOUS | Status: AC
  Administered 2020-03-24: 1672 mg via INTRAVENOUS
  Filled 2020-03-24: qty 43.97

## 2020-03-24 MED ORDER — POTASSIUM CHLORIDE 10 MEQ/100ML IV SOLN
10.0000 meq | INTRAVENOUS | Status: AC
  Administered 2020-03-24 (×2): 10 meq via INTRAVENOUS

## 2020-03-24 MED ORDER — SODIUM CHLORIDE 0.9 % IV SOLN
Freq: Once | INTRAVENOUS | Status: AC
  Filled 2020-03-24: qty 250

## 2020-03-24 MED ORDER — PROCHLORPERAZINE MALEATE 10 MG PO TABS
10.0000 mg | ORAL_TABLET | Freq: Once | ORAL | Status: AC
  Administered 2020-03-24: 10 mg via ORAL

## 2020-03-24 MED ORDER — POTASSIUM CHLORIDE CRYS ER 20 MEQ PO TBCR
EXTENDED_RELEASE_TABLET | ORAL | Status: AC
  Filled 2020-03-24: qty 1

## 2020-03-24 NOTE — Progress Notes (Signed)
This RN spoke with MD that additional K+ infusion will be out of time, MD states that it is okay for pt to receive 54mEq K+ IV total and add 76mEq K+ PO.

## 2020-03-24 NOTE — Patient Instructions (Signed)
[facility] Discharge Instructions for Patients Receiving Chemotherapy  Today you received the following chemotherapy agents Paclitaxel (Abraxane), Gemcitabine(Gemzar)  To help prevent nausea and vomiting after your treatment, we encourage you to take your nausea medication as directed.  If you develop nausea and vomiting that is not controlled by your nausea medication, call the clinic.   BELOW ARE SYMPTOMS THAT SHOULD BE REPORTED IMMEDIATELY:  *FEVER GREATER THAN 100.5 F  *CHILLS WITH OR WITHOUT FEVER  NAUSEA AND VOMITING THAT IS NOT CONTROLLED WITH YOUR NAUSEA MEDICATION  *UNUSUAL SHORTNESS OF BREATH  *UNUSUAL BRUISING OR BLEEDING  TENDERNESS IN MOUTH AND THROAT WITH OR WITHOUT PRESENCE OF ULCERS  *URINARY PROBLEMS  *BOWEL PROBLEMS  UNUSUAL RASH Items with * indicate a potential emergency and should be followed up as soon as possible.  Feel free to call the clinic should you have any questions or concerns. The clinic phone number is [phone].  Please show the CHEMO ALERT CARD at check-in to the Emergency Department and triage nurse.   

## 2020-03-24 NOTE — Patient Instructions (Signed)

## 2020-03-25 ENCOUNTER — Telehealth: Payer: Self-pay | Admitting: Hematology

## 2020-03-25 NOTE — Telephone Encounter (Signed)
Scheduled appt per 3/09 los - pt to get an updated schedule next visit

## 2020-03-30 ENCOUNTER — Ambulatory Visit (INDEPENDENT_AMBULATORY_CARE_PROVIDER_SITE_OTHER): Payer: Medicare Other | Admitting: Internal Medicine

## 2020-03-30 ENCOUNTER — Encounter: Payer: Self-pay | Admitting: Internal Medicine

## 2020-03-30 ENCOUNTER — Other Ambulatory Visit: Payer: Self-pay

## 2020-03-30 ENCOUNTER — Telehealth: Payer: Self-pay | Admitting: Internal Medicine

## 2020-03-30 DIAGNOSIS — K1379 Other lesions of oral mucosa: Secondary | ICD-10-CM | POA: Diagnosis not present

## 2020-03-30 DIAGNOSIS — R42 Dizziness and giddiness: Secondary | ICD-10-CM

## 2020-03-30 DIAGNOSIS — C252 Malignant neoplasm of tail of pancreas: Secondary | ICD-10-CM

## 2020-03-30 MED ORDER — MECLIZINE HCL 12.5 MG PO TABS: ORAL_TABLET | ORAL | 3 refills | Status: AC

## 2020-03-30 MED ORDER — MAGIC MOUTHWASH W/LIDOCAINE: 5.0000 mL | Freq: Four times a day (QID) | ORAL | 2 refills | Status: AC | PRN

## 2020-03-30 NOTE — Progress Notes (Signed)
   CC: Mouth Pain   HPI:  Mariah Henderson is a 70 y.o. M/F, with a PMH noted below, who presents to the clinic mouth sores. To see the management of their acute and chronic conditions, please see the A&P note under the Encounters tab.   Past Medical History:  Diagnosis Date  . Acute medial meniscus tear    right knee  . Anxiety   . Asthma   . Cervical spondylosis   . Chronic serous otitis media   . Constipation   . COPD (chronic obstructive pulmonary disease) ([place])   . Depression    followed by [name]; no longer of depakote, seroquel  . Diabetes mellitus without complication ([place])   . Dysuria 12/27/2009   Qualifier: Diagnosis of  By: [name] MD, Amanjot    . Foot drop   . GERD (gastroesophageal reflux disease)   . Heart murmur   . Hyperlipidemia   . Hypertension   . Low back pain   . Obesity   . Paraumbilical hernia   . Pre-diabetes    Review of Systems:   Review of Systems  Constitutional: Negative for chills, fever, malaise/fatigue and weight loss.  HENT:       Mouth sores  Respiratory: Negative for cough, hemoptysis, sputum production, shortness of breath and wheezing.   Cardiovascular: Negative for chest pain and palpitations.  Gastrointestinal: Negative for abdominal pain, heartburn, nausea and vomiting.  Musculoskeletal: Negative for myalgias and neck pain.  Skin: Negative for itching and rash.     Physical Exam:  Vitals:   03/30/20 1354 03/30/20 1454  BP: (!) 170/88 (!) 149/85  Pulse: 84   Temp: 98.2 F (36.8 C)   TempSrc: Oral   SpO2: 100%   Weight: 138 lb 8 oz (62.8 kg)    Physical Exam Constitutional:      General: She is not in acute distress.    Appearance: Normal appearance. She is not ill-appearing or toxic-appearing.  HENT:     Head: Normocephalic.     Mouth/Throat:     Mouth: Mucous membranes are moist.     Pharynx: Posterior oropharyngeal erythema present.     Comments: Ulcerations appreciated on the lateral aspect of the L  tongue surface. No obvious mucosal involvement.  Cardiovascular:     Rate and Rhythm: Normal rate and regular rhythm.     Pulses: Normal pulses.     Heart sounds: Normal heart sounds. No murmur heard. No friction rub. No gallop.   Pulmonary:     Effort: Pulmonary effort is normal.     Breath sounds: Normal breath sounds. No wheezing or rales.  Neurological:     Mental Status: She is alert.        Assessment & Plan:   See Encounters Tab for problem based charting.  Patient discussed with Dr. Rebeca Alert

## 2020-03-30 NOTE — Patient Instructions (Addendum)
To Ms. Adelstein,  It was a pleasure meeting you today. Today we discussed your sore mouth. We are concerned that your mouth sores may be due to your chemotherapy drugs. We have called them during your visit and received a voicemail. I will continue to call them today to see what we can do for your mouth sores. In the meantime, we will refill your meclizine today. I will reach out to you once I hear from your oncology office.  Have a good day! Maudie Mercury, MD

## 2020-03-30 NOTE — Telephone Encounter (Signed)
Pt called today requesting an appt to see you any day you have ava. Pt understands no open appts with you.  Offered pt appt to be seen this afternoon with Dr. Shon Baton @ 2:15 pm and pt sch for  a sore mouth and vertigo.  Pt is still requesting to see at any other time you have available or speak with you if possible. Please advise.

## 2020-03-31 ENCOUNTER — Telehealth: Payer: Self-pay

## 2020-03-31 ENCOUNTER — Encounter: Payer: Self-pay | Admitting: Internal Medicine

## 2020-03-31 ENCOUNTER — Encounter: Payer: Self-pay | Admitting: Genetic Counselor

## 2020-03-31 DIAGNOSIS — K1379 Other lesions of oral mucosa: Secondary | ICD-10-CM | POA: Insufficient documentation

## 2020-03-31 NOTE — Assessment & Plan Note (Signed)
Patient presents to the clinic with mouth sores that appeared on 03/26/20. She states that spicy foods and her Listerine exacerbates her pain. She is able to swallow, but notes that there are lesion on and below her tongue. Of note, she recently started chemotherapy for pancreatic cancer and was started on gemcitabine andAbraxane.  She started her chemotherapy early February and has gone through 3 rounds of chemo. There have been no other medication changes during this time. There are lesions on the patients tongue.   Given no other changes, patient may be idiopathic stomatitis. Not seeing mucosal involvement. Concerned for possible drug interaction gemcitabine. Will reach out to her Oncologist's office to evaluate patient. Will appreciate any recommendations they give.  - Called Dr. Ernestina Penna clinic, left message for on call RN to call back to speak with patient or to have scheduled in clinic. Patient does endorse having a meeting with oncology next week.

## 2020-03-31 NOTE — Progress Notes (Signed)
Internal Medicine Clinic Attending  Case discussed with Dr. Winters at the time of the visit.  We reviewed the resident's history and exam and pertinent patient test results.  I agree with the assessment, diagnosis, and plan of care documented in the resident's note.  Tilley Faeth, M.D., Ph.D.  

## 2020-03-31 NOTE — Telephone Encounter (Signed)
Contacted patient in attempt to disclose results of genetic testing.  LVM with contact information requesting a call back.  Third attempt.  Letter sent to inform her that results are available.

## 2020-03-31 NOTE — Telephone Encounter (Signed)
I spoke with Mariah Henderson.  Her mouth and throat are sore, no definitive sours. She is able to drink and eat.  I reviewed use of magic mouthwash.  She willc all tomorrow if the pain is not better.  She verbalized understanding.

## 2020-04-01 ENCOUNTER — Telehealth: Payer: Self-pay

## 2020-04-01 ENCOUNTER — Other Ambulatory Visit: Payer: Self-pay | Admitting: Internal Medicine

## 2020-04-01 DIAGNOSIS — I10 Essential (primary) hypertension: Secondary | ICD-10-CM

## 2020-04-01 NOTE — Telephone Encounter (Signed)
Mariah Henderson called stating that after she blows her nose there is a small amount of blood on the tissue.  I instructed her to use saline nose spray. She verbalized understanding.

## 2020-04-01 NOTE — Telephone Encounter (Signed)
Pt has been added

## 2020-04-01 NOTE — Telephone Encounter (Signed)
You can put her on at 1045 on 3/24.  I had my CC moved to then.  Happy to see her.

## 2020-04-02 NOTE — Progress Notes (Signed)
[name]   [phone] [phone]   Clinic Follow up Note   Patient Care Team: Sid Falcon, MD as PCP - General (Internal Medicine) Forrest Moron, [place] (Optometry) Jonnie Finner, RN as Oncology Nurse Navigator Truitt Merle, MD as Consulting Physician (Oncology) Gwyndolyn Kaufman, RN as Registered Nurse  Date of Service:  04/07/2020  CHIEF COMPLAINT: F/u Pancreaticcancer withliver metastasis  SUMMARY OF ONCOLOGIC HISTORY: Oncology History Overview Note  Cancer Staging Pancreatic cancer [facility]) Staging form: Exocrine Pancreas, AJCC 8th Edition - Clinical: Stage IV (cT1c, cN0, pM1) - Signed by Truitt Merle, MD on 02/26/2020 Total positive nodes: 0    Pancreatic cancer ([place])  01/13/2020 Imaging   US Abdomen 01/13/20  IMPRESSION: 1. Suggestion of 3 vascular hypoechoic mass within the liver measuring up to 2.5 cm. 2. Suggestion of a hypoechoic mass within the tail of the pancreas that is not well visualized. 3. Findings concerning for malignancy, please see separately dictated CT abdomen pelvis 01/13/2020.   01/13/2020 Imaging   CT AP 01/13/20  IMPRESSION: 1. Hypoenhancing 2.0 cm in long axis mass in the pancreatic tail compatible with pancreatic adenocarcinoma. The mass abuts the posterior gastric wall but without definite gastric invasion. The splenic vein appears narrowed/attenuated compared to prior exams and the lesion abuts the margin of the splenic artery. 2. Hypoenhancing hepatic masses are new compared to 11/22/2017 and are highly suspicious for metastatic lesions. 3. Questionable 0.8 by 0.8 cm omental tumor nodule versus dense diverticulum above the transverse colon. 4. Other imaging findings of potential clinical significance: Mild distal esophageal wall thickening, query esophagitis. Stable left adrenal adenoma. Sigmoid colon diverticulosis. Lumbar and thoracic spondylosis and degenerative disc disease contributing to  multilevel impingement. 5. Aortic atherosclerosis. Aortic Atherosclerosis (ICD10-I70.0).   01/21/2020 Initial Diagnosis   Pancreatic cancer ([place])   01/28/2020 Initial Biopsy   FINAL MICROSCOPIC DIAGNOSIS:   A. LIVER, NEEDLE CORE BIOPSY:  - Adenocarcinoma.   COMMENT:   Immunohistochemistry for CK7 is positive.  CDX-2 demonstrates weak to  moderate positive staining.  TTF-1 and PAX 8 demonstrate weak, likely  nonspecific staining.  CK20, GATA-3 and ER are negative.  The  morphologic and immunophenotypic characteristics are compatible with the  clinical impression of a primary pancreatic cancer.  Radiologic  correlation is encouraged.  If applicable, there is likely sufficient  tissue for ancillary studies (Block A2).  Dr. Vic Ripper reviewed the  case.    02/26/2020 -  Chemotherapy   First-line Gemcitabine and Abraxane every 2 weeks starting 02/26/20    02/26/2020 Cancer Staging   Staging form: Exocrine Pancreas, AJCC 8th Edition - Clinical: Stage IV (cT1c, cN0, pM1) - Signed by Truitt Merle, MD on 02/26/2020 Total positive nodes: 0   03/08/2020 Genetic Testing   Negative hereditary cancer genetic testing: no pathogenic variants detected in [facility] Common Hereditary Cancers Panel with preliminary evidence pancreatic cancer genes.  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 The report date is March 08, 2020.    The Common Hereditary Cancers Panel with preliminary evidence pancreatic cancer genes offered by [facility] includes sequencing and/or deletion duplication testing of the following 49 genes: APC, ATM, AXIN2, BARD1, BMPR1A, BRCA1, BRCA2, BRIP1, CDH1, CDK4, CDKN2A (p14ARF), CDKN2A (p16INK4a), CHEK2, CTNNA1, DICER1, EPCAM (Deletion/duplication testing only), GREM1 (promoter region deletion/duplication testing only), FANCC, GREM1, HOXB13, KIT, MEN1, MLH1, MSH2, MSH3, MSH6, MUTYH, NBN, NF1, NHTL1, PALB2, PALLD, PDGFRA, PMS2, POLD1, POLE, PTEN, RAD50, RAD51C,  RAD51D, SDHA, SDHB, SDHC, SDHD, SMAD4, SMARCA4.  STK11, TP53, TSC1, TSC2, and VHL.  The following genes were evaluated for sequence changes only: SDHA and HOXB13 c.251G>A variant only.es only: SDHA and HOXB13 c.251G>A variant only.      CURRENT THERAPY:  First-line Gemcitabine and Abraxane every2 weeksstarting2/10/22   INTERVAL HISTORY:  [name] is here for a follow up. She presents to the clinic with her daughter. She notes she has been able to gain weight. She is able to take care of herself at home. She notes hair loss. She is overall tolerating chemo well.     REVIEW OF SYSTEMS:   Constitutional: Denies fevers, chills or abnormal weight loss Eyes: Denies blurriness of vision Ears, nose, mouth, throat, and face: Denies mucositis or sore throat Respiratory: Denies cough, dyspnea or wheezes Cardiovascular: Denies palpitation, chest discomfort or lower extremity swelling Gastrointestinal:  Denies nausea, heartburn or change in bowel habits Skin: Denies abnormal skin rashes (+) hair loss  Lymphatics: Denies new lymphadenopathy or easy bruising Neurological:Denies numbness, tingling or new weaknesses Behavioral/Psych: Mood is stable, no new changes  All other systems were reviewed with the patient and are negative.  MEDICAL HISTORY:  Past Medical History:  Diagnosis Date  . Acute medial meniscus tear    right knee  . Anxiety   . Asthma   . Cervical spondylosis   . Chronic serous otitis media   . Constipation   . COPD (chronic obstructive pulmonary disease) ([place])   . Depression    followed by [name]; no longer of depakote, seroquel  . Diabetes mellitus without complication ([place])   . Dysuria 12/27/2009   Qualifier: Diagnosis of  By: [name] MD, Amanjot    . Foot drop   . GERD (gastroesophageal reflux disease)   . Heart murmur   . Hyperlipidemia   . Hypertension   . Low back pain   . Obesity   . Paraumbilical hernia   . Pre-diabetes     SURGICAL  HISTORY: Past Surgical History:  Procedure Laterality Date  . ABDOMINAL HYSTERECTOMY    . ANTERIOR CERVICAL DECOMP/DISCECTOMY FUSION N/A 10/05/2014   Procedure: ACDF - C5-C6 - C6-C7;  Surgeon: Karie Chimera, MD;  Location: [facility];  Service: Neurosurgery;  Laterality: N/A;  ACDF - C5-C6 - C6-C7  . CHOLECYSTECTOMY    . COLONOSCOPY    . FOOT SURGERY    . HERNIA REPAIR  1999  . IR IMAGING GUIDED PORT INSERTION  02/25/2020  . IR US GUIDANCE  01/28/2020  . KNEE ARTHROSCOPY WITH MEDIAL MENISECTOMY Right 06/14/2016   Procedure: RIGHT KNEE ARTHROSCOPY WITH PARTIAL MEDIAL MENISCECTOMY, SUBCHONDROPLASTY;  Surgeon: Leandrew Koyanagi, MD;  Location: [facility];  Service: Orthopedics;  Laterality: Right;  . KNEE ARTHROSCOPY WITH SUBCHONDROPLASTY Right 06/14/2016   Procedure: KNEE ARTHROSCOPY WITH SUBCHONDROPLASTY;  Surgeon: Leandrew Koyanagi, MD;  Location: [facility];  Service: Orthopedics;  Laterality: Right;  . RADIOACTIVE SEED GUIDED EXCISIONAL BREAST BIOPSY Left 06/23/2014   Procedure: RADIOACTIVE SEED GUIDED EXCISIONAL BREAST BIOPSY;  Surgeon: Stark Klein, MD;  Location: [facility];  Service: General;  Laterality: Left;    I have reviewed the social history and family history with the patient and they are unchanged from previous note.  ALLERGIES:  has No Known Allergies.  MEDICATIONS:  Current Outpatient Medications  Medication Sig Dispense Refill  . albuterol (PROVENTIL) (2.5 MG/3ML) 0.083% nebulizer solution Take 3 mLs (2.5 mg total) by nebulization every 6 (six) hours as needed for wheezing. 1080 mL 3  .  albuterol (VENTOLIN HFA) 108 (90 Base) MCG/ACT inhaler INHALE 1 TO 2 PUFFS INTO THE LUNGS EVERY 6 HOURS AS NEEDED FOR WHEEZING OR SHORTNESS OF BREATH 54 g 1  . alendronate (FOSAMAX) 70 MG tablet TAKE 1 TABLET BY MOUTH EVERY 7 DAYS. TAKE WITH A FULL GLASS OF WATER ON AN EMPTY STOMACH. 12 tablet 3  . atorvastatin (LIPITOR) 40 MG tablet Take 1 tablet (40 mg  total) by mouth daily. 90 tablet 3  . benazepril (LOTENSIN) 20 MG tablet TAKE 1 TABLET(20 MG) BY MOUTH DAILY 90 tablet 3  . calcium carbonate (OS-CAL) 600 MG TABS tablet Take by mouth.    . celecoxib (CELEBREX) 100 MG capsule TAKE 1 CAPSULE BY MOUTH  TWICE DAILY 180 capsule 3  . cholecalciferol (VITAMIN D) 1000 units tablet Take 2,000 Units by mouth daily.    . diclofenac Sodium (VOLTAREN) 1 % GEL Apply 2 g topically 4 (four) times daily. 100 g 2  . famotidine (PEPCID) 40 MG tablet TAKE 1/2 TABLET(20 MG) BY MOUTH TWICE DAILY 30 tablet 3  . fluticasone (FLONASE) 50 MCG/ACT nasal spray Place 1 spray into both nostrils daily. 16 g 3  . Fluticasone-Salmeterol (ADVAIR DISKUS) 100-50 MCG/DOSE AEPB Inhale 1 puff into the lungs 2 (two) times daily. 60 each 11  . furosemide (LASIX) 20 MG tablet take 1 tablet by mouth once daily 30 tablet 11  . glipiZIDE (GLUCOTROL XL) 2.5 MG 24 hr tablet Take 1 tablet (2.5 mg total) by mouth daily with breakfast. 30 tablet 2  . hydrochlorothiazide (MICROZIDE) 12.5 MG capsule TAKE 1 CAPSULE(12.5 MG) BY MOUTH DAILY 90 capsule 1  . HYDROcodone-acetaminophen (NORCO/VICODIN) 5-325 MG tablet Take 1 tablet by mouth 2 (two) times daily as needed for severe pain. 60 tablet 0  . ipratropium (ATROVENT) 0.02 % nebulizer solution Take 0.5 mg by nebulization every 6 (six) hours as needed for wheezing or shortness of breath.    . latanoprost (XALATAN) 0.005 % ophthalmic solution PLACE 1 DROP INTO BOTH EYES AT BEDTIME  0  . lidocaine-prilocaine (EMLA) cream Apply to affected area once 30 g 3  . loperamide (LOPERAMIDE A-D) 2 MG tablet Take 1 tablet (2 mg total) by mouth 2 (two) times daily as needed for diarrhea or loose stools. 6 tablet 0  . loratadine (CLARITIN) 10 MG tablet Take 1 tablet (10 mg total) by mouth daily. 30 tablet 11  . magic mouthwash w/lidocaine SOLN Take 5 mLs by mouth 4 (four) times daily as needed for mouth pain. 480 mL 2  . meclizine (ANTIVERT) 12.5 MG tablet TAKE 1  TABLET(12.5 MG) BY MOUTH TWICE DAILY AS NEEDED FOR DIZZINESS 15 tablet 3  . ondansetron (ZOFRAN) 4 MG tablet Take 1-2 tablets (4-8 mg total) by mouth every 8 (eight) hours as needed for nausea or vomiting. 15 tablet 0  . ondansetron (ZOFRAN) 8 MG tablet Take 1 tablet (8 mg total) by mouth 2 (two) times daily as needed (Nausea or vomiting). 30 tablet 1  . ONETOUCH ULTRA test strip USE TO TEST BLOOD SUGAR  ONCE DAILY 50 strip 6  . potassium chloride (KLOR-CON) 20 MEQ packet Take 20 mEq by mouth 2 (two) times daily. 60 packet 1  . prochlorperazine (COMPAZINE) 10 MG tablet Take 1 tablet (10 mg total) by mouth every 6 (six) hours as needed (Nausea or vomiting). 30 tablet 1  . risperiDONE (RISPERDAL) 0.25 MG tablet Take 0.25 mg by mouth daily as needed.    . sitaGLIPtin (JANUVIA) 25 MG tablet Take 1  tablet (25 mg total) by mouth daily. 90 tablet 1  . traZODone (DESYREL) 50 MG tablet Take 25-50 mg by mouth at bedtime as needed for sleep.     Marland Kitchen triamcinolone cream (KENALOG) 0.1 % apply to affected area twice a day 454 g 1   No current facility-administered medications for this visit.    PHYSICAL EXAMINATION: ECOG PERFORMANCE STATUS: 1 - Symptomatic but completely ambulatory  Vitals:   04/07/20 1348  BP: (!) 143/73  Pulse: 83  Resp: 19  Temp: (!) 96.5 F (35.8 C)  SpO2: 99%   Filed Weights   04/07/20 1348  Weight: 140 lb 8 oz (63.7 kg)    GENERAL:alert, no distress and comfortable SKIN: skin color, texture, turgor are normal, no rashes or significant lesions EYES: normal, Conjunctiva are pink and non-injected, sclera clear  NECK: supple, thyroid normal size, non-tender, without nodularity LYMPH:  no palpable lymphadenopathy in the cervical, axillary  LUNGS: clear to auscultation and percussion with normal breathing effort HEART: regular rate & rhythm and no murmurs and no lower extremity edema ABDOMEN:abdomen soft, non-tender and normal bowel sounds (+) Very mild tenderness, no  hepatomegaly.  Musculoskeletal:no cyanosis of digits and no clubbing  NEURO: alert & oriented x 3 with fluent speech, no focal motor/sensory deficits  LABORATORY DATA:  I have reviewed the data as listed CBC Latest Ref Rng & Units 04/07/2020 03/24/2020 03/12/2020  WBC 4.0 - 10.5 K/uL 3.6(L) 3.7(L) 4.6  Hemoglobin 12.0 - 15.0 g/dL 11.4(L) 11.4(L) 12.0  Hematocrit 36.0 - 46.0 % 35.1(L) 35.4(L) 37.2  Platelets 150 - 400 K/uL 277 253 336     CMP Latest Ref Rng & Units 04/07/2020 03/24/2020 03/12/2020  Glucose 70 - 99 mg/dL 279(H) 155(H) 172(H)  BUN 8 - 23 mg/dL 7(L) 7(L) 8  Creatinine 0.44 - 1.00 mg/dL 0.76 0.74 0.74  Sodium 135 - 145 mmol/L 143 142 138  Potassium 3.5 - 5.1 mmol/L 3.1(L) 2.9(L) 3.3(L)  Chloride 98 - 111 mmol/L 101 99 100  CO2 22 - 32 mmol/L 30 34(H) 31  Calcium 8.9 - 10.3 mg/dL 8.6(L) 9.3 9.2  Total Protein 6.5 - 8.1 g/dL 6.4(L) 6.5 6.4(L)  Total Bilirubin 0.3 - 1.2 mg/dL 1.1 1.1 1.6(H)  Alkaline Phos 38 - 126 U/L 566(H) 591(H) 582(H)  AST 15 - 41 U/L 69(H) 68(H) 65(H)  ALT 0 - 44 U/L 47(H) 50(H) 53(H)      RADIOGRAPHIC STUDIES: I have personally reviewed the radiological images as listed and agreed with the findings in the report. No results found.   ASSESSMENT & PLAN:  Mariah Henderson is a 70 y.o. female with   1. Pancreaticcancer withLivermetastasis, stage IV -Her CT AP from 01/13/20 showed 2cm mass on tail of pancreas.The mass abuts the posterior gastric wall but without definite gastric invasion.Scan also showsmultiple (at least5)hepatic masses highly suspicious for metastatic lesionsandQuestionable 0.8 by 0.8 cm omental tumor nodule versus dense diverticulum above the transverse colon. -Herliver biopsy from 01/28/20 showed adenocarcinoma from pancreatic primary. Her 02/11/20 CT Chestshowed no metastasis -I started her on first linechemo with gemcitabine andAbraxaneevery 2 weeks beginning 02/26/20.Plan to repeat CT scan after cycle 5 -S/p C3 she is  tolerating moderately well with hair loss and mild fatigue. She is able to gain weight. Her appetite has overall improved. Will monitor her baseline neuropathy. Labs reviewed, WBC 3.6, Hg 11.4, ANC 1.5. Overall adequate to proceed with C4 Gem/Abraxane today at same dose.  -F/u in 2 weeks. Plan to do CT scan in 3-4  weeks.    2. Abdominal Pain, low appetite, weight loss, Transmanitis  -She had abdominal pain for the past month before cancer diagnosis, 5/10. She is on Norco 5-357m BID as needed. her pain has improved since chemo  -She had sudden loss of appetite and weight loss since October 2021. She has overall lost 10 pounds.she has gained some weight back lately  -On Chemo her appetite has been able to increase and eating more.   -I encouraged her to elevate her feet when sitting at home for her mild LE edema.   3. Comorbidities: Asthma, Borderline Diabetic, GERD, Heart murmur, HLD, HTN, arthritis  -On medications, including multiple diuretics.  -Managed by PCP.  4. Social support -She lives alone in 1 level house and she is retired. She has 1 adult son who lives in [place] She has a twin and 3 other sisters who live in [place] and other siblings and relative in [place] -Her son will help her at home -She has approved for grant to help with medication. Her sister will notify me of the type of grant.   5. Cytopenias  -She developed mild neutropenia and thrombocytopenia, secondary to chemotherapy -Thrombocytopenia has resolved after C2, but has developed mild anemia with Hg 11.4.   6.Goal of care discussion  -The patient understands the goal of care is palliative. -I recommend DNR/DNI, she will think about it  7. Hypokalemia  -K still low, 3.1 today. She is on Lasix and dieretics for her LE edema and heart.  -continue oral K , will increase to tid  -Continue 35-40 ounces of water daily.   8. Genetic testing from 02/19/20 was negative for pathogenetic mutation with VUS of  gene NF1  PLAN: -Labs reviewed and adequate to proceed with C4 Gem/Abraxane today at same dose   -Lab, flush, f/u and Gem/Abraxane in 2, 4 weeks. -CT AP w contrast in 3-4 weeks.  -increase oral KCL    No problem-specific Assessment & Plan notes found for this encounter.   Orders Placed This Encounter  Procedures  . CT Abdomen Pelvis W Contrast    Standing Status:   Future    Standing Expiration Date:   04/07/2021    Order Specific Question:   If indicated for the ordered procedure, I authorize the administration of contrast media per Radiology protocol    Answer:   Yes    Order Specific Question:   [facility]    [facility]   Order Specific Question:   Release to patient    Answer:   Immediate    Order Specific Question:   Is Oral Contrast requested for this exam?    Answer:   Yes, Per Radiology protocol   All questions were answered. The patient knows to call the clinic with any problems, questions or concerns. No barriers to learning was detected. The total time spent in the appointment was 30 minutes.     YTruitt Merle MD 04/07/2020   I, AJoslyn Devon am acting as scribe for YTruitt Merle MD.   I have reviewed the above documentation for accuracy and completeness, and I agree with the above.

## 2020-04-05 ENCOUNTER — Telehealth: Payer: Self-pay

## 2020-04-05 NOTE — Telephone Encounter (Signed)
#  15 with 3 refills sent to [facility] on 03/30/2020. Patient notified and she will call pharmacy now. She was very Patent attorney.

## 2020-04-05 NOTE — Telephone Encounter (Signed)
  meclizine (ANTIVERT) 12.5 MG tablet, refill request @  [facility] [phone] [address], [place] AT [facility] [address] [place] Phone:  [phone]  Fax:  [phone]

## 2020-04-07 ENCOUNTER — Inpatient Hospital Stay: Payer: Medicare Other

## 2020-04-07 ENCOUNTER — Inpatient Hospital Stay (HOSPITAL_BASED_OUTPATIENT_CLINIC_OR_DEPARTMENT_OTHER): Payer: Medicare Other | Admitting: Hematology

## 2020-04-07 ENCOUNTER — Encounter: Payer: Self-pay | Admitting: Hematology

## 2020-04-07 ENCOUNTER — Other Ambulatory Visit: Payer: Self-pay

## 2020-04-07 VITALS — BP 143/73 | HR 83 | Temp 96.5°F | Resp 19 | Ht 60.0 in | Wt 140.5 lb

## 2020-04-07 DIAGNOSIS — Z7189 Other specified counseling: Secondary | ICD-10-CM

## 2020-04-07 DIAGNOSIS — C252 Malignant neoplasm of tail of pancreas: Secondary | ICD-10-CM

## 2020-04-07 DIAGNOSIS — C787 Secondary malignant neoplasm of liver and intrahepatic bile duct: Secondary | ICD-10-CM | POA: Diagnosis not present

## 2020-04-07 DIAGNOSIS — Z5111 Encounter for antineoplastic chemotherapy: Secondary | ICD-10-CM | POA: Diagnosis not present

## 2020-04-07 DIAGNOSIS — Z95828 Presence of other vascular implants and grafts: Secondary | ICD-10-CM

## 2020-04-07 LAB — CBC WITH DIFFERENTIAL (CANCER CENTER ONLY)
Abs Immature Granulocytes: 0.03 10*3/uL (ref 0.00–0.07)
Basophils Absolute: 0.1 10*3/uL (ref 0.0–0.1)
Basophils Relative: 2 %
Eosinophils Absolute: 0.1 10*3/uL (ref 0.0–0.5)
Eosinophils Relative: 4 %
HCT: 35.1 % — ABNORMAL LOW (ref 36.0–46.0)
Hemoglobin: 11.4 g/dL — ABNORMAL LOW (ref 12.0–15.0)
Immature Granulocytes: 1 %
Lymphocytes Relative: 18 %
Lymphs Abs: 0.6 10*3/uL — ABNORMAL LOW (ref 0.7–4.0)
MCH: 27.8 pg (ref 26.0–34.0)
MCHC: 32.5 g/dL (ref 30.0–36.0)
MCV: 85.6 fL (ref 80.0–100.0)
Monocytes Absolute: 1.3 10*3/uL — ABNORMAL HIGH (ref 0.1–1.0)
Monocytes Relative: 35 %
Neutro Abs: 1.5 10*3/uL — ABNORMAL LOW (ref 1.7–7.7)
Neutrophils Relative %: 40 %
Platelet Count: 277 10*3/uL (ref 150–400)
RBC: 4.1 MIL/uL (ref 3.87–5.11)
RDW: 17 % — ABNORMAL HIGH (ref 11.5–15.5)
WBC Count: 3.6 10*3/uL — ABNORMAL LOW (ref 4.0–10.5)
nRBC: 0.6 % — ABNORMAL HIGH (ref 0.0–0.2)

## 2020-04-07 LAB — CMP (CANCER CENTER ONLY)
ALT: 47 U/L — ABNORMAL HIGH (ref 0–44)
AST: 69 U/L — ABNORMAL HIGH (ref 15–41)
Albumin: 2.6 g/dL — ABNORMAL LOW (ref 3.5–5.0)
Alkaline Phosphatase: 566 U/L — ABNORMAL HIGH (ref 38–126)
Anion gap: 12 (ref 5–15)
BUN: 7 mg/dL — ABNORMAL LOW (ref 8–23)
CO2: 30 mmol/L (ref 22–32)
Calcium: 8.6 mg/dL — ABNORMAL LOW (ref 8.9–10.3)
Chloride: 101 mmol/L (ref 98–111)
Creatinine: 0.76 mg/dL (ref 0.44–1.00)
GFR, Estimated: >60 mL/min (ref >60)
Glucose, Bld: 279 mg/dL — ABNORMAL HIGH (ref 70–99)
Potassium: 3.1 mmol/L — ABNORMAL LOW (ref 3.5–5.1)
Sodium: 143 mmol/L (ref 135–145)
Total Bilirubin: 1.1 mg/dL (ref 0.3–1.2)
Total Protein: 6.4 g/dL — ABNORMAL LOW (ref 6.5–8.1)

## 2020-04-07 MED ORDER — SODIUM CHLORIDE 0.9% FLUSH
10.0000 mL | Freq: Once | INTRAVENOUS | Status: AC
  Administered 2020-04-07: 10 mL
  Filled 2020-04-07: qty 10

## 2020-04-07 MED ORDER — HEPARIN SOD (PORK) LOCK FLUSH 100 UNIT/ML IV SOLN
500.0000 [IU] | Freq: Once | INTRAVENOUS | Status: AC | PRN
  Administered 2020-04-07: 500 [IU]
  Filled 2020-04-07: qty 5

## 2020-04-07 MED ORDER — PROCHLORPERAZINE MALEATE 10 MG PO TABS
ORAL_TABLET | ORAL | Status: AC
  Filled 2020-04-07: qty 1

## 2020-04-07 MED ORDER — SODIUM CHLORIDE 0.9 % IV SOLN
Freq: Once | INTRAVENOUS | Status: AC
  Filled 2020-04-07: qty 250

## 2020-04-07 MED ORDER — SODIUM CHLORIDE 0.9 % IV SOLN
1000.0000 mg/m2 | Freq: Once | INTRAVENOUS | Status: AC
  Administered 2020-04-07: 1672 mg via INTRAVENOUS
  Filled 2020-04-07: qty 43.97

## 2020-04-07 MED ORDER — SODIUM CHLORIDE 0.9% FLUSH
10.0000 mL | INTRAVENOUS | Status: DC | PRN
  Administered 2020-04-07: 10 mL
  Filled 2020-04-07: qty 10

## 2020-04-07 MED ORDER — PACLITAXEL PROTEIN-BOUND CHEMO INJECTION 100 MG
100.0000 mg/m2 | Freq: Once | INTRAVENOUS | Status: AC
  Administered 2020-04-07: 175 mg via INTRAVENOUS
  Filled 2020-04-07: qty 35

## 2020-04-07 MED ORDER — PROCHLORPERAZINE MALEATE 10 MG PO TABS
10.0000 mg | ORAL_TABLET | Freq: Once | ORAL | Status: AC
  Administered 2020-04-07: 10 mg via ORAL

## 2020-04-07 NOTE — Patient Instructions (Signed)
Implanted Port Insertion, Care After This sheet gives you information about how to care for yourself after your procedure. Your health care provider may also give you more specific instructions. If you have problems or questions, contact your health care provider. What can I expect after the procedure? After the procedure, it is common to have:  Discomfort at the port insertion site.  Bruising on the skin over the port. This should improve over 3-4 days. Follow these instructions at home: Port care  After your port is placed, you will get a manufacturer's information card. The card has information about your port. Keep this card with you at all times.  Take care of the port as told by your health care provider. Ask your health care provider if you or a family member can get training for taking care of the port at home. A home health care nurse may also take care of the port.  Make sure to remember what type of port you have. Incision care  Follow instructions from your health care provider about how to take care of your port insertion site. Make sure you: ? Wash your hands with soap and water before and after you change your bandage (dressing). If soap and water are not available, use hand sanitizer. ? Change your dressing as told by your health care provider. ? Leave stitches (sutures), skin glue, or adhesive strips in place. These skin closures may need to stay in place for 2 weeks or longer. If adhesive strip edges start to loosen and curl up, you may trim the loose edges. Do not remove adhesive strips completely unless your health care provider tells you to do that.  Check your port insertion site every day for signs of infection. Check for: ? Redness, swelling, or pain. ? Fluid or blood. ? Warmth. ? Pus or a bad smell.      Activity  Return to your normal activities as told by your health care provider. Ask your health care provider what activities are safe for you.  Do not  lift anything that is heavier than 10 lb (4.5 kg), or the limit that you are told, until your health care provider says that it is safe. General instructions  Take over-the-counter and prescription medicines only as told by your health care provider.  Do not take baths, swim, or use a hot tub until your health care provider approves. Ask your health care provider if you may take showers. You may only be allowed to take sponge baths.  Do not drive for 24 hours if you were given a sedative during your procedure.  Wear a medical alert bracelet in case of an emergency. This will tell any health care providers that you have a port.  Keep all follow-up visits as told by your health care provider. This is important. Contact a health care provider if:  You cannot flush your port with saline as directed, or you cannot draw blood from the port.  You have a fever or chills.  You have redness, swelling, or pain around your port insertion site.  You have fluid or blood coming from your port insertion site.  Your port insertion site feels warm to the touch.  You have pus or a bad smell coming from the port insertion site. Get help right away if:  You have chest pain or shortness of breath.  You have bleeding from your port that you cannot control. Summary  Take care of the port as told by your   health care provider. Keep the manufacturer's information card with you at all times.  Change your dressing as told by your health care provider.  Contact a health care provider if you have a fever or chills or if you have redness, swelling, or pain around your port insertion site.  Keep all follow-up visits as told by your health care provider. This information is not intended to replace advice given to you by your health care provider. Make sure you discuss any questions you have with your health care provider. Document Revised: 07/31/2017 Document Reviewed: 07/31/2017 Elsevier Patient Education   2021 Elsevier Inc.  

## 2020-04-07 NOTE — Patient Instructions (Signed)
[facility] Discharge Instructions for Patients Receiving Chemotherapy  Today you received the following chemotherapy agents Paclitaxel (Abraxane), Gemcitabine(Gemzar)  To help prevent nausea and vomiting after your treatment, we encourage you to take your nausea medication as directed.  If you develop nausea and vomiting that is not controlled by your nausea medication, call the clinic.   BELOW ARE SYMPTOMS THAT SHOULD BE REPORTED IMMEDIATELY:  *FEVER GREATER THAN 100.5 F  *CHILLS WITH OR WITHOUT FEVER  NAUSEA AND VOMITING THAT IS NOT CONTROLLED WITH YOUR NAUSEA MEDICATION  *UNUSUAL SHORTNESS OF BREATH  *UNUSUAL BRUISING OR BLEEDING  TENDERNESS IN MOUTH AND THROAT WITH OR WITHOUT PRESENCE OF ULCERS  *URINARY PROBLEMS  *BOWEL PROBLEMS  UNUSUAL RASH Items with * indicate a potential emergency and should be followed up as soon as possible.  Feel free to call the clinic should you have any questions or concerns. The clinic phone number is [phone].  Please show the [facility] at check-in to the Emergency Department and triage nurse.

## 2020-04-08 ENCOUNTER — Ambulatory Visit (INDEPENDENT_AMBULATORY_CARE_PROVIDER_SITE_OTHER): Payer: Medicare Other | Admitting: Internal Medicine

## 2020-04-08 ENCOUNTER — Telehealth: Payer: Self-pay

## 2020-04-08 ENCOUNTER — Encounter: Payer: Self-pay | Admitting: Internal Medicine

## 2020-04-08 ENCOUNTER — Telehealth: Payer: Self-pay | Admitting: *Deleted

## 2020-04-08 VITALS — BP 138/69 | HR 90 | Temp 99.2°F | Ht 60.0 in | Wt 139.6 lb

## 2020-04-08 DIAGNOSIS — C259 Malignant neoplasm of pancreas, unspecified: Secondary | ICD-10-CM | POA: Diagnosis not present

## 2020-04-08 DIAGNOSIS — J4520 Mild intermittent asthma, uncomplicated: Secondary | ICD-10-CM

## 2020-04-08 DIAGNOSIS — E1139 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: Secondary | ICD-10-CM | POA: Diagnosis not present

## 2020-04-08 DIAGNOSIS — I10 Essential (primary) hypertension: Secondary | ICD-10-CM | POA: Diagnosis not present

## 2020-04-08 DIAGNOSIS — R509 Fever, unspecified: Secondary | ICD-10-CM

## 2020-04-08 LAB — POCT GLYCOSYLATED HEMOGLOBIN (HGB A1C): Hemoglobin A1C: 6.8 % — AB (ref 4.0–5.6)

## 2020-04-08 LAB — GLUCOSE, CAPILLARY: Glucose-Capillary: 199 mg/dL — ABNORMAL HIGH (ref 70–99)

## 2020-04-08 MED ORDER — ONETOUCH ULTRASOFT LANCETS MISC: 12 refills | Status: DC

## 2020-04-08 MED ORDER — ONETOUCH VERIO VI STRP: ORAL_STRIP | 12 refills | Status: DC

## 2020-04-08 NOTE — Assessment & Plan Note (Signed)
Currently no complaints of wheezing or other symptoms.  She reports taking her inhalers as prescribed.

## 2020-04-08 NOTE — Telephone Encounter (Signed)
[facility] pharmacy tech is requesting a call back

## 2020-04-08 NOTE — Assessment & Plan Note (Signed)
I have asked Mariah Henderson to recheck her temperature this afternoon and let the clinic know if it is rising.  We checked a CBC today with differential to ensure that her [place] is not lower, particularly below 500.  If she has a temperature > 101F or persistent temperature of 100.4 and lower ANC, will contact Dr. Burr Medico concerning possible fever with neutropenia admission.

## 2020-04-08 NOTE — Progress Notes (Signed)
   Subjective:    [name], female    DOB: 02-Nov-1950, 70 y.o.   MRN: [id number]  CC: follow up of DM and HTN  HPI  Today Mariah Henderson presents for follow up of her DM and HTN.  These are both doing well with her A1C being < 7 and her BP being at goal of 138/69.  She is following with oncology for pancreatic adenocarcinoma and is undergoing chemotherapy.  She was warm to the touch today, but noted feeling okay.  Initial temperature was 99.32F followed by 98.31F.  We discussed any concerns and she reports pain is well controlled with pain medication provided by Dr. Burr Medico. She has no reported wheezing, SOB, cough, chills.  She has been eating better since starting chemotherapy.  She continues to live alone and has support if she needs it.  Her ANC at oncology yesterday was 1500.  She denies dysuria.    Review of Systems  Constitutional: Positive for appetite change (improved). Negative for activity change, chills, diaphoresis, fatigue and fever.  HENT: Negative for congestion and mouth sores.   Respiratory: Negative for cough and shortness of breath.   Cardiovascular: Negative for chest pain and palpitations.  Gastrointestinal: Negative for abdominal pain.  Genitourinary: Negative for difficulty urinating and dysuria.  Neurological: Negative for dizziness and weakness.       Objective:   Physical Exam Vitals and nursing note reviewed.  Constitutional:      General: She is not in acute distress.    Appearance: She is not ill-appearing, toxic-appearing or diaphoretic.  HENT:     Head: Normocephalic and atraumatic.     Mouth/Throat:     Mouth: Mucous membranes are moist.     Pharynx: No oropharyngeal exudate or posterior oropharyngeal erythema.  Eyes:     General: No scleral icterus.    Conjunctiva/sclera: Conjunctivae normal.  Cardiovascular:     Rate and Rhythm: Normal rate and regular rhythm.     Pulses: Normal pulses.     Heart sounds: No murmur  heard.   Pulmonary:     Effort: Pulmonary effort is normal. No respiratory distress.     Breath sounds: Normal breath sounds. No wheezing.  Abdominal:     General: There is no distension.     Palpations: Abdomen is soft.     Tenderness: There is no abdominal tenderness.  Musculoskeletal:        General: Swelling (mild to ankles) present. No tenderness.  Skin:    General: Skin is warm and dry.     Coloration: Skin is not jaundiced or pale.  Neurological:     General: No focal deficit present.     Mental Status: She is alert and oriented to person, place, and time.  Psychiatric:        Mood and Affect: Mood normal.        Behavior: Behavior normal.     CBC today to ensure she is not getting more neutropenic.      Assessment & Plan:  Return in 3 months for A1C.

## 2020-04-08 NOTE — Assessment & Plan Note (Signed)
BP today is well controlled at 138/69.  She has no headache, no chest pain.  She is taking her medications without issue.   Plan  Continue current therapy of HCTZ.  May consider stopping this and starting a new therapy if her K continues to be low at next oncology visit.

## 2020-04-08 NOTE — Patient Instructions (Signed)
Mariah Henderson  ---  Your blood pressure and diabetes are doing well.   I am hopeful that your CT scan coming up goes well!  Take Care and come back to see me in 3 months.

## 2020-04-08 NOTE — Assessment & Plan Note (Signed)
She reports doing well.  She is taking her glipizide as prescribed.  She is no longer on metformin or [place].   Plan A1C is 6.8 Continue glipizide New monitor given today Ordered strips and lancets.

## 2020-04-08 NOTE — Telephone Encounter (Signed)
Pharmacy stated they need directions and frequency for strips and lancets.

## 2020-04-08 NOTE — Telephone Encounter (Signed)
Pt states she tried to check her temperature as requested by Dr Daryll Drown but having trouble with the thermometer she has. I asked pt to check it tomorrow and call me back - stated she will.

## 2020-04-09 LAB — CBC WITH DIFFERENTIAL/PLATELET
Basophils Absolute: 0.1 10*3/uL (ref 0.0–0.2)
Basos: 2 %
EOS (ABSOLUTE): 0.1 10*3/uL (ref 0.0–0.4)
Eos: 3 %
Hematocrit: 35.6 % (ref 34.0–46.6)
Hemoglobin: 11.5 g/dL (ref 11.1–15.9)
Immature Grans (Abs): 0 10*3/uL (ref 0.0–0.1)
Immature Granulocytes: 1 %
Lymphocytes Absolute: 0.4 10*3/uL — ABNORMAL LOW (ref 0.7–3.1)
Lymphs: 11 %
MCH: 27.8 pg (ref 26.6–33.0)
MCHC: 32.3 g/dL (ref 31.5–35.7)
MCV: 86 fL (ref 79–97)
Monocytes Absolute: 1 10*3/uL — ABNORMAL HIGH (ref 0.1–0.9)
Monocytes: 29 %
NRBC: 1 % — ABNORMAL HIGH (ref 0–0)
Neutrophils Absolute: 1.8 10*3/uL (ref 1.4–7.0)
Neutrophils: 54 %
Platelets: 278 10*3/uL (ref 150–450)
RBC: 4.13 x10E6/uL (ref 3.77–5.28)
RDW: 14.5 % (ref 11.7–15.4)
WBC: 3.4 10*3/uL (ref 3.4–10.8)

## 2020-04-09 MED ORDER — ONETOUCH VERIO VI STRP: ORAL_STRIP | 12 refills | Status: DC

## 2020-04-09 MED ORDER — ONETOUCH ULTRASOFT LANCETS MISC: 12 refills | Status: DC

## 2020-04-09 NOTE — Telephone Encounter (Signed)
Thank you.  Her WBC looked better, so I am less worried about a brewing infection.  Please let me know if she calls back with an elevated temperature.

## 2020-04-09 NOTE — Telephone Encounter (Signed)
Call from pt with her temps. Yesterday @ 1710 PM - 99.5; 1720 PM - 99.6; 1825 PM - 98.3. and this morning@ 1015 AM- 99.7. inform pt Dr Daryll Drown said "WBC looked better, so I am less worried about a brewing infection." ; she stated "good'.

## 2020-04-09 NOTE — Telephone Encounter (Signed)
Orders changed. 

## 2020-04-20 NOTE — Progress Notes (Signed)
[facility]   [phone] [phone]   Clinic Follow up Note   Patient Care Team: Sid Falcon, MD as PCP - General (Internal Medicine) Forrest Moron, [place] (Optometry) Jonnie Finner, RN as Oncology Nurse Navigator Truitt Merle, MD as Consulting Physician (Oncology) Gwyndolyn Kaufman, RN as Registered Nurse 04/21/2020  CHIEF COMPLAINT: Follow-up metastatic pancreas cancer  SUMMARY OF ONCOLOGIC HISTORY: Oncology History Overview Note  Cancer Staging Pancreatic cancer [facility]) Staging form: Exocrine Pancreas, AJCC 8th Edition - Clinical: Stage IV (cT1c, cN0, pM1) - Signed by Truitt Merle, MD on 02/26/2020 Total positive nodes: 0    Pancreatic cancer ([place])  01/13/2020 Imaging   US Abdomen 01/13/20  IMPRESSION: 1. Suggestion of 3 vascular hypoechoic mass within the liver measuring up to 2.5 cm. 2. Suggestion of a hypoechoic mass within the tail of the pancreas that is not well visualized. 3. Findings concerning for malignancy, please see separately dictated CT abdomen pelvis 01/13/2020.   01/13/2020 Imaging   CT AP 01/13/20  IMPRESSION: 1. Hypoenhancing 2.0 cm in long axis mass in the pancreatic tail compatible with pancreatic adenocarcinoma. The mass abuts the posterior gastric wall but without definite gastric invasion. The splenic vein appears narrowed/attenuated compared to prior exams and the lesion abuts the margin of the splenic artery. 2. Hypoenhancing hepatic masses are new compared to 11/22/2017 and are highly suspicious for metastatic lesions. 3. Questionable 0.8 by 0.8 cm omental tumor nodule versus dense diverticulum above the transverse colon. 4. Other imaging findings of potential clinical significance: Mild distal esophageal wall thickening, query esophagitis. Stable left adrenal adenoma. Sigmoid colon diverticulosis. Lumbar and thoracic spondylosis and degenerative disc disease contributing to multilevel impingement. 5.  Aortic atherosclerosis. Aortic Atherosclerosis (ICD10-I70.0).   01/21/2020 Initial Diagnosis   Pancreatic cancer ([place])   01/28/2020 Initial Biopsy   FINAL MICROSCOPIC DIAGNOSIS:   A. LIVER, NEEDLE CORE BIOPSY:  - Adenocarcinoma.   COMMENT:   Immunohistochemistry for CK7 is positive.  CDX-2 demonstrates weak to  moderate positive staining.  TTF-1 and PAX 8 demonstrate weak, likely  nonspecific staining.  CK20, GATA-3 and ER are negative.  The  morphologic and immunophenotypic characteristics are compatible with the  clinical impression of a primary pancreatic cancer.  Radiologic  correlation is encouraged.  If applicable, there is likely sufficient  tissue for ancillary studies (Block A2).  Dr. Vic Ripper reviewed the  case.    02/26/2020 -  Chemotherapy   First-line Gemcitabine and Abraxane every 2 weeks starting 02/26/20    02/26/2020 Cancer Staging   Staging form: Exocrine Pancreas, AJCC 8th Edition - Clinical: Stage IV (cT1c, cN0, pM1) - Signed by Truitt Merle, MD on 02/26/2020 Total positive nodes: 0   03/08/2020 Genetic Testing   Negative hereditary cancer genetic testing: no pathogenic variants detected in [facility] Common Hereditary Cancers Panel with preliminary evidence pancreatic cancer genes.  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 The report date is March 08, 2020.    The Common Hereditary Cancers Panel with preliminary evidence pancreatic cancer genes offered by [facility] includes sequencing and/or deletion duplication testing of the following 49 genes: APC, ATM, AXIN2, BARD1, BMPR1A, BRCA1, BRCA2, BRIP1, CDH1, CDK4, CDKN2A (p14ARF), CDKN2A (p16INK4a), CHEK2, CTNNA1, DICER1, EPCAM (Deletion/duplication testing only), GREM1 (promoter region deletion/duplication testing only), FANCC, GREM1, HOXB13, KIT, MEN1, MLH1, MSH2, MSH3, MSH6, MUTYH, NBN, NF1, NHTL1, PALB2, PALLD, PDGFRA, PMS2, POLD1, POLE, PTEN, RAD50, RAD51C, RAD51D, SDHA, SDHB, SDHC,  SDHD, SMAD4, SMARCA4. STK11, TP53, TSC1, TSC2, and  VHL.  The following genes were evaluated for sequence changes only: SDHA and HOXB13 c.251G>A variant only.es only: SDHA and HOXB13 c.251G>A variant only.     CURRENT THERAPY: First-line Gemcitabine and Abraxane every2 weeksstarting2/10/22  INTERVAL HISTORY: Ms. Gulotta returns for follow-up and treatment as scheduled.  She completed cycle 4 gem/Abraxane on 04/07/2020.  She is doing very well, appetite has improved.  Energy is adequate, she is active at home and independent.  She has bitter taste in her mouth but no mucositis.  Mild nausea is well managed with Zofran, denies vomiting.  Bowels moving normally.  Abdominal and back pain have improved on treatment, takes ibuprofen or Tylenol if needed.  Denies fever, chills, cough, chest pain, dyspnea, worsening edema or neuropathy, bleeding, or any new concerns.    MEDICAL HISTORY:  Past Medical History:  Diagnosis Date  . Acute medial meniscus tear    right knee  . Anxiety   . Asthma   . Cervical spondylosis   . Chronic serous otitis media   . Constipation   . COPD (chronic obstructive pulmonary disease) ([place])   . Depression    followed by [name]; no longer of depakote, seroquel  . Diabetes mellitus without complication ([place])   . Dysuria 12/27/2009   Qualifier: Diagnosis of  By: [name] MD, Amanjot    . Foot drop   . GERD (gastroesophageal reflux disease)   . Heart murmur   . Hyperlipidemia   . Hypertension   . Low back pain   . Obesity   . Paraumbilical hernia   . Pre-diabetes     SURGICAL HISTORY: Past Surgical History:  Procedure Laterality Date  . ABDOMINAL HYSTERECTOMY    . ANTERIOR CERVICAL DECOMP/DISCECTOMY FUSION N/A 10/05/2014   Procedure: ACDF - C5-C6 - C6-C7;  Surgeon: Karie Chimera, MD;  Location: [place] [facility];  Service: Neurosurgery;  Laterality: N/A;  ACDF - C5-C6 - C6-C7  . CHOLECYSTECTOMY    . COLONOSCOPY    . FOOT SURGERY    . HERNIA REPAIR  1999  . IR  IMAGING GUIDED PORT INSERTION  02/25/2020  . IR US GUIDANCE  01/28/2020  . KNEE ARTHROSCOPY WITH MEDIAL MENISECTOMY Right 06/14/2016   Procedure: RIGHT KNEE ARTHROSCOPY WITH PARTIAL MEDIAL MENISCECTOMY, SUBCHONDROPLASTY;  Surgeon: Leandrew Koyanagi, MD;  Location: [facility];  Service: Orthopedics;  Laterality: Right;  . KNEE ARTHROSCOPY WITH SUBCHONDROPLASTY Right 06/14/2016   Procedure: KNEE ARTHROSCOPY WITH SUBCHONDROPLASTY;  Surgeon: Leandrew Koyanagi, MD;  Location: [place];  Service: Orthopedics;  Laterality: Right;  . RADIOACTIVE SEED GUIDED EXCISIONAL BREAST BIOPSY Left 06/23/2014   Procedure: RADIOACTIVE SEED GUIDED EXCISIONAL BREAST BIOPSY;  Surgeon: Stark Klein, MD;  Location: [facility];  Service: General;  Laterality: Left;    I have reviewed the social history and family history with the patient and they are unchanged from previous note.  ALLERGIES:  has No Known Allergies.  MEDICATIONS:  Current Outpatient Medications  Medication Sig Dispense Refill  . albuterol (PROVENTIL) (2.5 MG/3ML) 0.083% nebulizer solution Take 3 mLs (2.5 mg total) by nebulization every 6 (six) hours as needed for wheezing. 1080 mL 3  . albuterol (VENTOLIN HFA) 108 (90 Base) MCG/ACT inhaler INHALE 1 TO 2 PUFFS INTO THE LUNGS EVERY 6 HOURS AS NEEDED FOR WHEEZING OR SHORTNESS OF BREATH 54 g 1  . alendronate (FOSAMAX) 70 MG tablet TAKE 1 TABLET BY MOUTH EVERY 7 DAYS. TAKE WITH A FULL GLASS OF WATER ON AN EMPTY STOMACH.  12 tablet 3  . atorvastatin (LIPITOR) 40 MG tablet Take 1 tablet (40 mg total) by mouth daily. 90 tablet 3  . benazepril (LOTENSIN) 20 MG tablet TAKE 1 TABLET(20 MG) BY MOUTH DAILY 90 tablet 3  . calcium carbonate (OS-CAL) 600 MG TABS tablet Take by mouth.    . celecoxib (CELEBREX) 100 MG capsule TAKE 1 CAPSULE BY MOUTH  TWICE DAILY 180 capsule 3  . cholecalciferol (VITAMIN D) 1000 units tablet Take 2,000 Units by mouth daily.    . diclofenac Sodium  (VOLTAREN) 1 % GEL Apply 2 g topically 4 (four) times daily. 100 g 2  . famotidine (PEPCID) 40 MG tablet TAKE 1/2 TABLET(20 MG) BY MOUTH TWICE DAILY 30 tablet 3  . fluticasone (FLONASE) 50 MCG/ACT nasal spray Place 1 spray into both nostrils daily. 16 g 3  . Fluticasone-Salmeterol (ADVAIR DISKUS) 100-50 MCG/DOSE AEPB Inhale 1 puff into the lungs 2 (two) times daily. 60 each 11  . furosemide (LASIX) 20 MG tablet take 1 tablet by mouth once daily 30 tablet 11  . glipiZIDE (GLUCOTROL XL) 2.5 MG 24 hr tablet Take 1 tablet (2.5 mg total) by mouth daily with breakfast. 30 tablet 2  . glucose blood (ONETOUCH VERIO) test strip Use as instructed 100 each 12  . hydrochlorothiazide (MICROZIDE) 12.5 MG capsule TAKE 1 CAPSULE(12.5 MG) BY MOUTH DAILY 90 capsule 1  . HYDROcodone-acetaminophen (NORCO/VICODIN) 5-325 MG tablet Take 1 tablet by mouth 2 (two) times daily as needed for severe pain. 60 tablet 0  . ipratropium (ATROVENT) 0.02 % nebulizer solution Take 0.5 mg by nebulization every 6 (six) hours as needed for wheezing or shortness of breath.    . Lancets (ONETOUCH ULTRASOFT) lancets Use as instructed 100 each 12  . latanoprost (XALATAN) 0.005 % ophthalmic solution PLACE 1 DROP INTO BOTH EYES AT BEDTIME  0  . lidocaine-prilocaine (EMLA) cream Apply to affected area once 30 g 3  . loperamide (LOPERAMIDE A-D) 2 MG tablet Take 1 tablet (2 mg total) by mouth 2 (two) times daily as needed for diarrhea or loose stools. 6 tablet 0  . loratadine (CLARITIN) 10 MG tablet Take 1 tablet (10 mg total) by mouth daily. 30 tablet 11  . magic mouthwash w/lidocaine SOLN Take 5 mLs by mouth 4 (four) times daily as needed for mouth pain. 480 mL 2  . meclizine (ANTIVERT) 12.5 MG tablet TAKE 1 TABLET(12.5 MG) BY MOUTH TWICE DAILY AS NEEDED FOR DIZZINESS 15 tablet 3  . ondansetron (ZOFRAN) 8 MG tablet Take 1 tablet (8 mg total) by mouth 2 (two) times daily as needed (Nausea or vomiting). 30 tablet 1  . potassium chloride  (KLOR-CON) 20 MEQ packet Take 20 mEq by mouth 2 (two) times daily. 60 packet 1  . prochlorperazine (COMPAZINE) 10 MG tablet Take 1 tablet (10 mg total) by mouth every 6 (six) hours as needed (Nausea or vomiting). 30 tablet 1  . risperiDONE (RISPERDAL) 0.25 MG tablet Take 0.25 mg by mouth daily as needed.    . sertraline (ZOLOFT) 50 MG tablet Take 50 mg by mouth daily.    . traZODone (DESYREL) 50 MG tablet Take 25-50 mg by mouth at bedtime as needed for sleep.     Marland Kitchen triamcinolone cream (KENALOG) 0.1 % apply to affected area twice a day 454 g 1   No current facility-administered medications for this visit.    PHYSICAL EXAMINATION: ECOG PERFORMANCE STATUS: 1 - Symptomatic but completely ambulatory  Vitals:   04/21/20 1015  BP: Marland Kitchen)  146/78  Pulse: 85  Resp: 16  Temp: (!) 97.5 F (36.4 C)  SpO2: 100%   Filed Weights   04/21/20 1015  Weight: 145 lb 4.8 oz (65.9 kg)    GENERAL:alert, no distress and comfortable SKIN: No rash EYES: sclera clear OROPHARYNX: No thrush or ulcers LUNGS:  normal breathing effort HEART: Mild bilateral lower extremity edema NEURO: alert & oriented x 3 with fluent speech, no focal motor deficits PAC without erythema  LABORATORY DATA:  I have reviewed the data as listed CBC Latest Ref Rng & Units 04/21/2020 04/08/2020 04/07/2020  WBC 4.0 - 10.5 K/uL 4.2 3.4 3.6(L)  Hemoglobin 12.0 - 15.0 g/dL 11.1(L) 11.5 11.4(L)  Hematocrit 36.0 - 46.0 % 35.5(L) 35.6 35.1(L)  Platelets 150 - 400 K/uL 228 278 277     CMP Latest Ref Rng & Units 04/21/2020 04/07/2020 03/24/2020  Glucose 70 - 99 mg/dL 252(H) 279(H) 155(H)  BUN 8 - 23 mg/dL 9 7(L) 7(L)  Creatinine 0.44 - 1.00 mg/dL 0.71 0.76 0.74  Sodium 135 - 145 mmol/L 139 143 142  Potassium 3.5 - 5.1 mmol/L 3.1(L) 3.1(L) 2.9(L)  Chloride 98 - 111 mmol/L 100 101 99  CO2 22 - 32 mmol/L 27 30 34(H)  Calcium 8.9 - 10.3 mg/dL 8.5(L) 8.6(L) 9.3  Total Protein 6.5 - 8.1 g/dL 6.3(L) 6.4(L) 6.5  Total Bilirubin 0.3 - 1.2 mg/dL 1.0  1.1 1.1  Alkaline Phos 38 - 126 U/L 441(H) 566(H) 591(H)  AST 15 - 41 U/L 59(H) 69(H) 68(H)  ALT 0 - 44 U/L 36 47(H) 50(H)      RADIOGRAPHIC STUDIES: I have personally reviewed the radiological images as listed and agreed with the findings in the report. No results found.   ASSESSMENT & PLAN: Bernadette Gores is a 70 y.o. female with   1. Pancreaticcancer withLivermetastasis, stage IV -Her CT AP from 01/13/20 showed 2cm mass on tail of pancreas abutting but not invading the gastric wall, with likely liver and omental metastasis. -Herliver biopsy from 01/28/20 showed adenocarcinoma from pancreatic primary. Her 02/11/20 CT Chestshowed no metastasis -She began first-line palliative chemo with gemcitabine andAbraxaneevery 2 weeks beginning 02/26/20.Plan to repeat CT scan after cycle 5  2. Abdominal Pain, low appetite, 10 lbs weight loss, Transmanitis  -She had abdominal pain for the month prior to cancer diagnosis. She is on Norco 5-357m BID as needed.her pain has improved since chemo  -She had sudden loss of appetite and weight loss since October 2021.  -On Chemo her appetite has been able to increase and eating more.   3. Comorbidities: Asthma, Borderline Diabetic, GERD, Heart murmur, HLD, HTN, arthritis, neuropathy  -On medications, including multiple diuretics.  -Managed by PCP.   4. Social support -She lives alone in 1 level house and she is retired. She has 1 adult son who lives in [place] She has a twin and 3 other sisters who live in [place] and other siblings and relative in [place] -Her son will help her at home -She has approved for grant to help with medication. Her sister will notify [place] type [place].  5. Cytopenias  -secondary to chemotherapy   6.Goal of care discussion  -The patient understands the goal of care is palliative. -currently full code   7. Hypokalemia secondary to diuretic -She is on Lasix and dieretics for her LE edema  and heart.  -continue oral K once daily -Continue 35-40 ounces of water daily.   8. Genetic testing from 02/19/20 was negative for pathogenetic  mutation with VUS of gene NF1  Disposition Ms. Ned appears stable.  She completed 4 cycles of gemcitabine and Abraxane, she tolerates treatment very well overall.  She is able to recover and function well.  Nausea, abdominal/back pain, and weight loss have improved on treatment, consistent with a clinical response to chemo. We reviewed symptom management.  Labs reviewed, adequate to proceed with cycle 5 gem/Abraxane today as planned.  I recommend to increase oral K to TID (taking once daily). She will be scheduled for restaging after this cycle.  Plans to have eye surgery soon, will let us know the exact date so we can plan her next treatment accordingly.  Follow-up in 2 weeks.  All questions were answered. The patient knows to call the clinic with any problems, questions or concerns. No barriers to learning were detected.     Alla Feeling, NP 04/21/20

## 2020-04-21 ENCOUNTER — Inpatient Hospital Stay: Payer: Medicare Other | Admitting: Nurse Practitioner

## 2020-04-21 ENCOUNTER — Other Ambulatory Visit: Payer: Self-pay

## 2020-04-21 ENCOUNTER — Inpatient Hospital Stay: Payer: Medicare Other

## 2020-04-21 ENCOUNTER — Encounter: Payer: Self-pay | Admitting: Nurse Practitioner

## 2020-04-21 ENCOUNTER — Inpatient Hospital Stay: Payer: Medicare Other | Attending: Nurse Practitioner

## 2020-04-21 DIAGNOSIS — Z5111 Encounter for antineoplastic chemotherapy: Secondary | ICD-10-CM | POA: Diagnosis not present

## 2020-04-21 DIAGNOSIS — Z95828 Presence of other vascular implants and grafts: Secondary | ICD-10-CM

## 2020-04-21 DIAGNOSIS — Z7189 Other specified counseling: Secondary | ICD-10-CM

## 2020-04-21 DIAGNOSIS — C252 Malignant neoplasm of tail of pancreas: Secondary | ICD-10-CM | POA: Insufficient documentation

## 2020-04-21 DIAGNOSIS — C787 Secondary malignant neoplasm of liver and intrahepatic bile duct: Secondary | ICD-10-CM | POA: Diagnosis not present

## 2020-04-21 LAB — CBC WITH DIFFERENTIAL (CANCER CENTER ONLY)
Abs Immature Granulocytes: 0.04 10*3/uL (ref 0.00–0.07)
Basophils Absolute: 0.1 10*3/uL (ref 0.0–0.1)
Basophils Relative: 2 %
Eosinophils Absolute: 0.1 10*3/uL (ref 0.0–0.5)
Eosinophils Relative: 2 %
HCT: 35.5 % — ABNORMAL LOW (ref 36.0–46.0)
Hemoglobin: 11.1 g/dL — ABNORMAL LOW (ref 12.0–15.0)
Immature Granulocytes: 1 %
Lymphocytes Relative: 19 %
Lymphs Abs: 0.8 10*3/uL (ref 0.7–4.0)
MCH: 27.3 pg (ref 26.0–34.0)
MCHC: 31.3 g/dL (ref 30.0–36.0)
MCV: 87.2 fL (ref 80.0–100.0)
Monocytes Absolute: 1.4 10*3/uL — ABNORMAL HIGH (ref 0.1–1.0)
Monocytes Relative: 33 %
Neutro Abs: 1.8 10*3/uL (ref 1.7–7.7)
Neutrophils Relative %: 43 %
Platelet Count: 228 10*3/uL (ref 150–400)
RBC: 4.07 MIL/uL (ref 3.87–5.11)
RDW: 17.3 % — ABNORMAL HIGH (ref 11.5–15.5)
WBC Count: 4.2 10*3/uL (ref 4.0–10.5)
nRBC: 0.5 % — ABNORMAL HIGH (ref 0.0–0.2)

## 2020-04-21 LAB — CMP (CANCER CENTER ONLY)
ALT: 36 U/L (ref 0–44)
AST: 59 U/L — ABNORMAL HIGH (ref 15–41)
Albumin: 2.5 g/dL — ABNORMAL LOW (ref 3.5–5.0)
Alkaline Phosphatase: 441 U/L — ABNORMAL HIGH (ref 38–126)
Anion gap: 12 (ref 5–15)
BUN: 9 mg/dL (ref 8–23)
CO2: 27 mmol/L (ref 22–32)
Calcium: 8.5 mg/dL — ABNORMAL LOW (ref 8.9–10.3)
Chloride: 100 mmol/L (ref 98–111)
Creatinine: 0.71 mg/dL (ref 0.44–1.00)
GFR, Estimated: >60 mL/min (ref >60)
Glucose, Bld: 252 mg/dL — ABNORMAL HIGH (ref 70–99)
Potassium: 3.1 mmol/L — ABNORMAL LOW (ref 3.5–5.1)
Sodium: 139 mmol/L (ref 135–145)
Total Bilirubin: 1 mg/dL (ref 0.3–1.2)
Total Protein: 6.3 g/dL — ABNORMAL LOW (ref 6.5–8.1)

## 2020-04-21 MED ORDER — SODIUM CHLORIDE 0.9% FLUSH
10.0000 mL | INTRAVENOUS | Status: DC | PRN
  Administered 2020-04-21: 10 mL
  Filled 2020-04-21: qty 10

## 2020-04-21 MED ORDER — PROCHLORPERAZINE MALEATE 10 MG PO TABS
ORAL_TABLET | ORAL | Status: AC
  Filled 2020-04-21: qty 1

## 2020-04-21 MED ORDER — SODIUM CHLORIDE 0.9% FLUSH
10.0000 mL | Freq: Once | INTRAVENOUS | Status: AC
  Administered 2020-04-21: 10 mL
  Filled 2020-04-21: qty 10

## 2020-04-21 MED ORDER — PACLITAXEL PROTEIN-BOUND CHEMO INJECTION 100 MG
100.0000 mg/m2 | Freq: Once | INTRAVENOUS | Status: AC
  Administered 2020-04-21: 175 mg via INTRAVENOUS
  Filled 2020-04-21: qty 35

## 2020-04-21 MED ORDER — ONDANSETRON HCL 8 MG PO TABS: 8.0000 mg | ORAL_TABLET | Freq: Two times a day (BID) | ORAL | 1 refills | Status: DC | PRN

## 2020-04-21 MED ORDER — SODIUM CHLORIDE 0.9 % IV SOLN
Freq: Once | INTRAVENOUS | Status: AC
Start: 2020-04-21 — End: 2020-04-21
  Filled 2020-04-21: qty 250

## 2020-04-21 MED ORDER — HEPARIN SOD (PORK) LOCK FLUSH 100 UNIT/ML IV SOLN
500.0000 [IU] | Freq: Once | INTRAVENOUS | Status: DC | PRN
  Filled 2020-04-21: qty 5

## 2020-04-21 MED ORDER — SODIUM CHLORIDE 0.9 % IV SOLN
1000.0000 mg/m2 | Freq: Once | INTRAVENOUS | Status: AC
  Administered 2020-04-21: 1672 mg via INTRAVENOUS
  Filled 2020-04-21: qty 43.97

## 2020-04-21 MED ORDER — PROCHLORPERAZINE MALEATE 10 MG PO TABS
10.0000 mg | ORAL_TABLET | Freq: Once | ORAL | Status: AC
  Administered 2020-04-21: 10 mg via ORAL

## 2020-04-21 NOTE — Patient Instructions (Signed)
[facility] Discharge Instructions for Patients Receiving Chemotherapy  Today you received the following chemotherapy agents Paclitaxel-Protein(Abraxane), Gemcitabine(Gemzar)  To help prevent nausea and vomiting after your treatment, we encourage you to take your nausea medication as directed.  If you develop nausea and vomiting that is not controlled by your nausea medication, call the clinic.   BELOW ARE SYMPTOMS THAT SHOULD BE REPORTED IMMEDIATELY:  *FEVER GREATER THAN 100.5 F  *CHILLS WITH OR WITHOUT FEVER  NAUSEA AND VOMITING THAT IS NOT CONTROLLED WITH YOUR NAUSEA MEDICATION  *UNUSUAL SHORTNESS OF BREATH  *UNUSUAL BRUISING OR BLEEDING  TENDERNESS IN MOUTH AND THROAT WITH OR WITHOUT PRESENCE OF ULCERS  *URINARY PROBLEMS  *BOWEL PROBLEMS  UNUSUAL RASH Items with * indicate a potential emergency and should be followed up as soon as possible.  Feel free to call the clinic should you have any questions or concerns. The clinic phone number is [phone].  Please show the [facility] at check-in to the Emergency Department and triage nurse.

## 2020-04-21 NOTE — Patient Instructions (Signed)

## 2020-04-22 ENCOUNTER — Telehealth: Payer: Self-pay | Admitting: Hematology

## 2020-04-22 NOTE — Telephone Encounter (Signed)
Cancelled per 4/7 sch msg. Called pt and left a msg

## 2020-04-23 ENCOUNTER — Telehealth: Payer: Self-pay | Admitting: Hematology

## 2020-04-23 NOTE — Telephone Encounter (Signed)
Scheduled per los. Called and left msg. Mailed printout  °

## 2020-04-26 ENCOUNTER — Other Ambulatory Visit: Payer: Self-pay | Admitting: Internal Medicine

## 2020-05-03 ENCOUNTER — Encounter (HOSPITAL_COMMUNITY): Payer: Self-pay

## 2020-05-03 ENCOUNTER — Ambulatory Visit (HOSPITAL_COMMUNITY)
Admission: RE | Admit: 2020-05-03 | Discharge: 2020-05-03 | Disposition: A | Discharge status: Home / Self Care | Payer: Medicare Other | Source: Ambulatory Visit | Attending: Hematology | Admitting: Hematology

## 2020-05-03 ENCOUNTER — Other Ambulatory Visit: Payer: Self-pay

## 2020-05-03 DIAGNOSIS — C252 Malignant neoplasm of tail of pancreas: Secondary | ICD-10-CM | POA: Diagnosis not present

## 2020-05-03 DIAGNOSIS — C787 Secondary malignant neoplasm of liver and intrahepatic bile duct: Secondary | ICD-10-CM | POA: Diagnosis not present

## 2020-05-03 DIAGNOSIS — K769 Liver disease, unspecified: Secondary | ICD-10-CM | POA: Diagnosis not present

## 2020-05-03 DIAGNOSIS — D3500 Benign neoplasm of unspecified adrenal gland: Secondary | ICD-10-CM | POA: Diagnosis not present

## 2020-05-03 DIAGNOSIS — C259 Malignant neoplasm of pancreas, unspecified: Secondary | ICD-10-CM | POA: Diagnosis not present

## 2020-05-03 HISTORY — DX: Malignant (primary) neoplasm, unspecified: C80.1

## 2020-05-03 MED ORDER — IOHEXOL 300 MG/ML  SOLN
100.0000 mL | Freq: Once | INTRAMUSCULAR | Status: AC | PRN
  Administered 2020-05-03: 100 mL via INTRAVENOUS

## 2020-05-06 ENCOUNTER — Other Ambulatory Visit: Payer: Medicare Other

## 2020-05-06 ENCOUNTER — Ambulatory Visit: Payer: Medicare Other | Admitting: Hematology

## 2020-05-06 ENCOUNTER — Inpatient Hospital Stay: Payer: Medicare Other

## 2020-05-06 DIAGNOSIS — H2512 Age-related nuclear cataract, left eye: Secondary | ICD-10-CM | POA: Diagnosis not present

## 2020-05-14 ENCOUNTER — Telehealth: Payer: Self-pay

## 2020-05-14 DIAGNOSIS — Z9114 Patient's other noncompliance with medication regimen: Secondary | ICD-10-CM

## 2020-05-14 NOTE — Progress Notes (Signed)
[place]   [phone] [phone]   Clinic Follow up Note   Patient Care Team: Sid Falcon, MD as PCP - General (Internal Medicine) Forrest Moron, Williams (Optometry) Jonnie Finner, RN as Oncology Nurse Navigator Truitt Merle, MD as Consulting Physician (Oncology) Gwyndolyn Kaufman, RN as Registered Nurse  Date of Service:  05/19/2020  CHIEF COMPLAINT:  F/u Pancreaticcancer withliver metastasis  SUMMARY OF ONCOLOGIC HISTORY: Oncology History Overview Note  Cancer Staging Pancreatic cancer [facility]) Staging form: Exocrine Pancreas, AJCC 8th Edition - Clinical: Stage IV (cT1c, cN0, pM1) - Signed by Truitt Merle, MD on 02/26/2020 Total positive nodes: 0    Pancreatic cancer ([place])  01/13/2020 Imaging   US Abdomen 01/13/20  IMPRESSION: 1. Suggestion of 3 vascular hypoechoic mass within the liver measuring up to 2.5 cm. 2. Suggestion of a hypoechoic mass within the tail of the pancreas that is not well visualized. 3. Findings concerning for malignancy, please see separately dictated CT abdomen pelvis 01/13/2020.   01/13/2020 Imaging   CT AP 01/13/20  IMPRESSION: 1. Hypoenhancing 2.0 cm in long axis mass in the pancreatic tail compatible with pancreatic adenocarcinoma. The mass abuts the posterior gastric wall but without definite gastric invasion. The splenic vein appears narrowed/attenuated compared to prior exams and the lesion abuts the margin of the splenic artery. 2. Hypoenhancing hepatic masses are new compared to 11/22/2017 and are highly suspicious for metastatic lesions. 3. Questionable 0.8 by 0.8 cm omental tumor nodule versus dense diverticulum above the transverse colon. 4. Other imaging findings of potential clinical significance: Mild distal esophageal wall thickening, query esophagitis. Stable left adrenal adenoma. Sigmoid colon diverticulosis. Lumbar and thoracic spondylosis and degenerative disc disease contributing to  multilevel impingement. 5. Aortic atherosclerosis. Aortic Atherosclerosis (ICD10-I70.0).   01/21/2020 Initial Diagnosis   Pancreatic cancer ([place])   01/28/2020 Initial Biopsy   FINAL MICROSCOPIC DIAGNOSIS:   A. LIVER, NEEDLE CORE BIOPSY:  - Adenocarcinoma.   COMMENT:   Immunohistochemistry for CK7 is positive.  CDX-2 demonstrates weak to  moderate positive staining.  TTF-1 and PAX 8 demonstrate weak, likely  nonspecific staining.  CK20, GATA-3 and ER are negative.  The  morphologic and immunophenotypic characteristics are compatible with the  clinical impression of a primary pancreatic cancer.  Radiologic  correlation is encouraged.  If applicable, there is likely sufficient  tissue for ancillary studies (Block A2).  Dr. Vic Ripper reviewed the  case.    02/26/2020 - 05/19/2020 Chemotherapy   First-line Gemcitabine and Abraxane every 2 weeks starting 02/26/20. D/c after 05/19/20 due to disease progression in liver and neuropathy     02/26/2020 Cancer Staging   Staging form: Exocrine Pancreas, AJCC 8th Edition - Clinical: Stage IV (cT1c, cN0, pM1) - Signed by Truitt Merle, MD on 02/26/2020 Total positive nodes: 0   03/08/2020 Genetic Testing   Negative hereditary cancer genetic testing: no pathogenic variants detected in [employer] Common Hereditary Cancers Panel with preliminary evidence pancreatic cancer genes.  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 The report date is March 08, 2020.    The Common Hereditary Cancers Panel with preliminary evidence pancreatic cancer genes offered by [employer] includes sequencing and/or deletion duplication testing of the following 49 genes: APC, ATM, AXIN2, BARD1, BMPR1A, BRCA1, BRCA2, BRIP1, CDH1, CDK4, CDKN2A (p14ARF), CDKN2A (p16INK4a), CHEK2, CTNNA1, DICER1, EPCAM (Deletion/duplication testing only), GREM1 (promoter region deletion/duplication testing only), FANCC, GREM1, HOXB13, KIT, MEN1, MLH1, MSH2, MSH3, MSH6, MUTYH,  NBN, NF1, NHTL1, PALB2, PALLD, PDGFRA,  PMS2, POLD1, POLE, PTEN, RAD50, RAD51C, RAD51D, SDHA, SDHB, SDHC, SDHD, SMAD4, SMARCA4. STK11, TP53, TSC1, TSC2, and VHL.  The following genes were evaluated for sequence changes only: SDHA and HOXB13 c.251G>A variant only.es only: SDHA and HOXB13 c.251G>A variant only.   05/03/2020 Imaging   CT AP  IMPRESSION: 1. Interval decrease in size of a hypodense mass of the central pancreatic tail, consistent with treatment response of primary pancreatic malignancy. 2. Numerous low-attenuation liver lesions are seen, increased in size and number compared to prior examination. Findings are consistent with worsened hepatic metastatic disease. 3. A previously queried omental nodule adjacent to the transverse colon is more clearly a prominent diverticulum on current examination.   Aortic Atherosclerosis (ICD10-I70.0).    Chemotherapy   PENDING Second-line FOLFIRI q2weeks starting in 2 weeks.     06/02/2020 -  Chemotherapy    Patient is on Treatment Plan: PANCREAS LIPOSOMAL IRINOTECAN / LEUCOVORIN / 5-FU  Q14D         CURRENT THERAPY:  First-line Gemcitabine and Abraxane every2 weeksstarting2/10/22. D/c after 05/19/20 due to disease progression in liver and neuropathy  PENDING Second-line FOLFIRI q2weeks starting in 2 weeks.   INTERVAL HISTORY:  Mariah Henderson is here for a follow up. She was last seen by me 3 months ago. She presents to the clinic with her family member. She notes she had eye surgery for her cataract and will return later this year for the other eye to be done. She notes she feels well. She is active with moving 15 minutes away from last home. With increased activity she has b/l LE edema in the past week. She notes she is eating well. She has skin darkness and peeling. She has overall skin colo change of her hands. She notes numbness and tingling in her hands intermittently. I reviewed her medication list with her. She notes she manages  her own medication. She is mostly overall independent at home.  She notes recent vertigo spells. She notes her ENT has given her meclizine for this in the past.    REVIEW OF SYSTEMS:   Constitutional: Denies fevers, chills or abnormal weight loss Eyes: Denies blurriness of vision Ears, nose, mouth, throat, and face: Denies mucositis or sore throat Respiratory: Denies cough, dyspnea or wheezes Cardiovascular: Denies palpitation, chest discomfort (+) B/l lower extremity swelling Gastrointestinal:  Denies nausea, heartburn or change in bowel habits Skin: (+) Skin darkening, peeling Lymphatics: Denies new lymphadenopathy or easy bruising Neurological: (+) Numbness in hands and feet intermittently Behavioral/Psych: Mood is stable, no new changes  All other systems were reviewed with the patient and are negative.  MEDICAL HISTORY:  Past Medical History:  Diagnosis Date  . Acute medial meniscus tear    right knee  . Anxiety   . Asthma   . Cancer ([place])   . Cervical spondylosis   . Chronic serous otitis media   . Constipation   . COPD (chronic obstructive pulmonary disease) ([address])   . Depression    followed by [name]; no longer of depakote, seroquel  . Diabetes mellitus without complication ([place])   . Dysuria 12/27/2009   Qualifier: Diagnosis of  By: [name] MD, Amanjot    . Foot drop   . GERD (gastroesophageal reflux disease)   . Heart murmur   . Hyperlipidemia   . Hypertension   . Low back pain   . Obesity   . Paraumbilical hernia   . Pre-diabetes     SURGICAL HISTORY: Past Surgical History:  Procedure  Laterality Date  . ABDOMINAL HYSTERECTOMY    . ANTERIOR CERVICAL DECOMP/DISCECTOMY FUSION N/A 10/05/2014   Procedure: ACDF - C5-C6 - C6-C7;  Surgeon: Karie Chimera, MD;  Location: [facility];  Service: Neurosurgery;  Laterality: N/A;  ACDF - C5-C6 - C6-C7  . CHOLECYSTECTOMY    . COLONOSCOPY    . FOOT SURGERY    . HERNIA REPAIR  1999  . IR IMAGING GUIDED PORT INSERTION   02/25/2020  . IR US GUIDANCE  01/28/2020  . KNEE ARTHROSCOPY WITH MEDIAL MENISECTOMY Right 06/14/2016   Procedure: RIGHT KNEE ARTHROSCOPY WITH PARTIAL MEDIAL MENISCECTOMY, SUBCHONDROPLASTY;  Surgeon: Leandrew Koyanagi, MD;  Location: [facility];  Service: Orthopedics;  Laterality: Right;  . KNEE ARTHROSCOPY WITH SUBCHONDROPLASTY Right 06/14/2016   Procedure: KNEE ARTHROSCOPY WITH SUBCHONDROPLASTY;  Surgeon: Leandrew Koyanagi, MD;  Location: [facility];  Service: Orthopedics;  Laterality: Right;  . RADIOACTIVE SEED GUIDED EXCISIONAL BREAST BIOPSY Left 06/23/2014   Procedure: RADIOACTIVE SEED GUIDED EXCISIONAL BREAST BIOPSY;  Surgeon: Stark Klein, MD;  Location: [place];  Service: General;  Laterality: Left;    I have reviewed the social history and family history with the patient and they are unchanged from previous note.  ALLERGIES:  has No Known Allergies.  MEDICATIONS:  Current Outpatient Medications  Medication Sig Dispense Refill  . glipiZIDE (GLUCOTROL XL) 2.5 MG 24 hr tablet TAKE 1 TABLET(2.5 MG) BY MOUTH DAILY WITH BREAKFAST 30 tablet 2  . albuterol (PROVENTIL) (2.5 MG/3ML) 0.083% nebulizer solution Take 3 mLs (2.5 mg total) by nebulization every 6 (six) hours as needed for wheezing. 1080 mL 3  . albuterol (VENTOLIN HFA) 108 (90 Base) MCG/ACT inhaler INHALE 1 TO 2 PUFFS INTO THE LUNGS EVERY 6 HOURS AS NEEDED FOR WHEEZING OR SHORTNESS OF BREATH 54 g 1  . alendronate (FOSAMAX) 70 MG tablet TAKE 1 TABLET BY MOUTH EVERY 7 DAYS. TAKE WITH A FULL GLASS OF WATER ON AN EMPTY STOMACH. 12 tablet 3  . atorvastatin (LIPITOR) 40 MG tablet Take 1 tablet (40 mg total) by mouth daily. 90 tablet 3  . benazepril (LOTENSIN) 20 MG tablet TAKE 1 TABLET(20 MG) BY MOUTH DAILY 90 tablet 3  . calcium carbonate (OS-CAL) 600 MG TABS tablet Take by mouth.    . celecoxib (CELEBREX) 100 MG capsule TAKE 1 CAPSULE BY MOUTH  TWICE DAILY 180 capsule 3  . cholecalciferol (VITAMIN D)  1000 units tablet Take 2,000 Units by mouth daily.    . diclofenac Sodium (VOLTAREN) 1 % GEL Apply 2 g topically 4 (four) times daily. 100 g 2  . famotidine (PEPCID) 40 MG tablet TAKE 1/2 TABLET(20 MG) BY MOUTH TWICE DAILY 30 tablet 3  . fluticasone (FLONASE) 50 MCG/ACT nasal spray Place 1 spray into both nostrils daily. 16 g 3  . Fluticasone-Salmeterol (ADVAIR DISKUS) 100-50 MCG/DOSE AEPB Inhale 1 puff into the lungs 2 (two) times daily. 60 each 11  . furosemide (LASIX) 20 MG tablet take 1 tablet by mouth once daily 30 tablet 11  . glucose blood (ONETOUCH VERIO) test strip Use as instructed 100 each 12  . hydrochlorothiazide (MICROZIDE) 12.5 MG capsule TAKE 1 CAPSULE(12.5 MG) BY MOUTH DAILY 90 capsule 1  . HYDROcodone-acetaminophen (NORCO/VICODIN) 5-325 MG tablet Take 1 tablet by mouth 2 (two) times daily as needed for severe pain. 60 tablet 0  . ipratropium (ATROVENT) 0.02 % nebulizer solution Take 0.5 mg by nebulization every 6 (six) hours as needed for wheezing or shortness of  breath.    . Lancets (ONETOUCH ULTRASOFT) lancets Use as instructed 100 each 12  . latanoprost (XALATAN) 0.005 % ophthalmic solution PLACE 1 DROP INTO BOTH EYES AT BEDTIME  0  . loperamide (LOPERAMIDE A-D) 2 MG tablet Take 1 tablet (2 mg total) by mouth 2 (two) times daily as needed for diarrhea or loose stools. 6 tablet 0  . loratadine (CLARITIN) 10 MG tablet Take 1 tablet (10 mg total) by mouth daily. 30 tablet 11  . magic mouthwash w/lidocaine SOLN Take 5 mLs by mouth 4 (four) times daily as needed for mouth pain. 480 mL 2  . meclizine (ANTIVERT) 12.5 MG tablet TAKE 1 TABLET(12.5 MG) BY MOUTH TWICE DAILY AS NEEDED FOR DIZZINESS 15 tablet 3  . potassium chloride (KLOR-CON) 20 MEQ packet Take 20 mEq by mouth 2 (two) times daily. 60 packet 1  . risperiDONE (RISPERDAL) 0.25 MG tablet Take 0.25 mg by mouth daily as needed.    . sertraline (ZOLOFT) 50 MG tablet Take 50 mg by mouth daily.    . traZODone (DESYREL) 50 MG  tablet Take 25-50 mg by mouth at bedtime as needed for sleep.     Marland Kitchen triamcinolone cream (KENALOG) 0.1 % apply to affected area twice a day 454 g 1   No current facility-administered medications for this visit.    PHYSICAL EXAMINATION: ECOG PERFORMANCE STATUS: 1 - Symptomatic but completely ambulatory  Vitals:   05/19/20 1144  BP: (!) 161/91  Pulse: 87  Resp: 18  Temp: (!) 97.5 F (36.4 C)  SpO2: 99%   Filed Weights   05/19/20 1144  Weight: 141 lb (64 kg)    GENERAL:alert, no distress and comfortable SKIN: skin color, texture, turgor are normal, no rashes or significant lesions EYES: normal, Conjunctiva are pink and non-injected, sclera clear  NECK: supple, thyroid normal size, non-tender, without nodularity LYMPH:  no palpable lymphadenopathy in the cervical, axillary  LUNGS: clear to auscultation and percussion with normal breathing effort HEART: regular rate & rhythm and no murmurs (+) B/l lower extremity edema ABDOMEN:abdomen soft, non-tender and normal bowel sounds Musculoskeletal:no cyanosis of digits and no clubbing  NEURO: alert & oriented x 3 with fluent speech, no focal motor/sensory deficits  LABORATORY DATA:  I have reviewed the data as listed CBC Latest Ref Rng & Units 05/19/2020 04/21/2020 04/08/2020  WBC 4.0 - 10.5 K/uL 7.2 4.2 3.4  Hemoglobin 12.0 - 15.0 g/dL 13.3 11.1(L) 11.5  Hematocrit 36.0 - 46.0 % 41.7 35.5(L) 35.6  Platelets 150 - 400 K/uL 157 228 278     CMP Latest Ref Rng & Units 05/19/2020 04/21/2020 04/07/2020  Glucose 70 - 99 mg/dL 226(H) 252(H) 279(H)  BUN 8 - 23 mg/dL 9 9 7(L)  Creatinine 0.44 - 1.00 mg/dL 0.76 0.71 0.76  Sodium 135 - 145 mmol/L 140 139 143  Potassium 3.5 - 5.1 mmol/L 3.5 3.1(L) 3.1(L)  Chloride 98 - 111 mmol/L 101 100 101  CO2 22 - 32 mmol/L $RemoveB'30 27 30  'gfnVOwbI$ Calcium 8.9 - 10.3 mg/dL 9.2 8.5(L) 8.6(L)  Total Protein 6.5 - 8.1 g/dL 6.8 6.3(L) 6.4(L)  Total Bilirubin 0.3 - 1.2 mg/dL 1.3(H) 1.0 1.1  Alkaline Phos 38 - 126 U/L 345(H)  441(H) 566(H)  AST 15 - 41 U/L 61(H) 59(H) 69(H)  ALT 0 - 44 U/L 36 36 47(H)      RADIOGRAPHIC STUDIES: I have personally reviewed the radiological images as listed and agreed with the findings in the report. VAS [place] LOWER EXTREMITY VENOUS (DVT)  Result Date: 05/19/2020  Lower Venous DVT Study Patient Name:  Mariah Henderson  Date of Exam:   05/19/2020 Medical Rec #: [id number]        Accession #:    [id number] Date of Birth: Nov 27, 1950         Patient Gender: F Patient Age:   70Y Exam Location:  [facility] Procedure:      VAS [place] LOWER EXTREMITY VENOUS (DVT) Referring Phys: [id number] Truitt Merle --------------------------------------------------------------------------------  Indications: Swelling.  Risk Factors: Cancer. Limitations: Poor ultrasound/tissue interface. Comparison Study: No prior studies. Performing Technologist: Oliver Hum RVT  Examination Guidelines: A complete evaluation includes B-mode imaging, spectral Doppler, color Doppler, and power Doppler as needed of all accessible portions of each vessel. Bilateral testing is considered an integral part of a complete examination. Limited examinations for reoccurring indications may be performed as noted. The reflux portion of the exam is performed with the patient in reverse Trendelenburg.  +---------+---------------+---------+-----------+----------+--------------+ RIGHT    CompressibilityPhasicitySpontaneityPropertiesThrombus Aging +---------+---------------+---------+-----------+----------+--------------+ CFV      Full           Yes      Yes                                 +---------+---------------+---------+-----------+----------+--------------+ SFJ      Full                                                        +---------+---------------+---------+-----------+----------+--------------+ FV Prox  Full                                                         +---------+---------------+---------+-----------+----------+--------------+ FV Mid   Full                                                        +---------+---------------+---------+-----------+----------+--------------+ FV DistalFull                                                        +---------+---------------+---------+-----------+----------+--------------+ PFV      Full                                                        +---------+---------------+---------+-----------+----------+--------------+ POP      Full           Yes      Yes                                 +---------+---------------+---------+-----------+----------+--------------+ PTV      Full                                                        +---------+---------------+---------+-----------+----------+--------------+  PERO     Full                                                        +---------+---------------+---------+-----------+----------+--------------+   +---------+---------------+---------+-----------+----------+--------------+ LEFT     CompressibilityPhasicitySpontaneityPropertiesThrombus Aging +---------+---------------+---------+-----------+----------+--------------+ CFV      Full           Yes      Yes                                 +---------+---------------+---------+-----------+----------+--------------+ SFJ      Full                                                        +---------+---------------+---------+-----------+----------+--------------+ FV Prox  Full                                                        +---------+---------------+---------+-----------+----------+--------------+ FV Mid   Full                                                        +---------+---------------+---------+-----------+----------+--------------+ FV DistalFull                                                         +---------+---------------+---------+-----------+----------+--------------+ PFV      Full                                                        +---------+---------------+---------+-----------+----------+--------------+ POP      Full           Yes      Yes                                 +---------+---------------+---------+-----------+----------+--------------+ PTV      Full                                                        +---------+---------------+---------+-----------+----------+--------------+ PERO     Full                                                        +---------+---------------+---------+-----------+----------+--------------+  Summary: RIGHT: - There is no evidence of deep vein thrombosis in the lower extremity. However, portions of this examination were limited- see technologist comments above.  - No cystic structure found in the popliteal fossa.  LEFT: - There is no evidence of deep vein thrombosis in the lower extremity. However, portions of this examination were limited- see technologist comments above.  - No cystic structure found in the popliteal fossa.  *See table(s) above for measurements and observations. Electronically signed by Jamelle Haring on 05/19/2020 at 5:03:33 PM.    Final      ASSESSMENT & PLAN:  Mariah Henderson is a 70 y.o. female with    1. Pancreaticcancer withLivermetastasis, stage IV -Her CT AP from 01/13/20 showed 2cm mass on tail of pancreas.The mass abuts the posterior gastric wall but without definite gastric invasion.Scan also showsmultiple (at least5)hepatic masses highly suspicious for metastatic lesionsandQuestionable 0.8 by 0.8 cm omental tumor nodule versus dense diverticulum above the transverse colon. -Herliver biopsy from 01/28/20 showed adenocarcinoma from pancreatic primary. Her 02/11/20 CT Chestshowed no metastasis -I started her on first linechemo with gemcitabine andAbraxaneevery 2 weeks beginning  02/26/20. -Her CT AP from 05/03/20 showed Interval decrease in size of the primary pancreatic tumor but increased in size and number of liver metastasis compared to prior examination. Findings are consistent with worsened hepatic metastatic disease. I personlly reviewed scan images with patient today. She overall had mixed response, and possible also related that her previous scan was done 6 weeks ago before her first dose chemo -I discussed given liver met progression and development of neuropathy form Abraxane, I recommend changing her chemo to second-line FOLFIRI with liposomal irinotecan q2weeks.   --Chemotherapy consent: Side effects including but does not limited to, fatigue, nausea, vomiting, diarrhea which could be severe, hair loss, neuropathy, fluid retention, renal and kidney dysfunction, neutropenic fever, needed for blood transfusion, bleeding, were discussed with patient in great detail. She agrees to proceed. -The goal of chemotherapy is prolong her life and preserve her quality of life -Labs reviewed. Plan to proceed with last cycle Gem/Abraxane today with Abraxane dose reduction.  -F/u in 2 weeks with start of FOLFIRI. Plan to reduce dose chemo Dropodex due to her advanced age and limited social support.  If she tolerated well, will increase dose in subsequent cycles.  2. Abdominal Pain, low appetite, weight loss, Transmanitis  -She had abdominal pain for the past month before cancer diagnosis, 5/10.She is on Norco 5-$RemoveBe'325mg'KeHQCGFEG$  BID as needed.her pain has improved since chemo  -She had sudden loss of appetite and weight loss since October 2021. She has overall lost 10 pounds.she has gained some weight back lately  -On Chemo her appetite has been able to increase and eating more.  -I encouraged her to elevate her feet when sitting at home for her mild LE edema.   3. Comorbidities: Asthma, Borderline Diabetic, GERD, Heart murmur, HLD, HTN, arthritis  -On medications, including multiple  diuretics.  -Managed by PCP.  4. Social support -She lives alone in 1 level house and she is retired. She has 1 adult son who lives in [place]. She has a twin and 3 other sisters who live in [place], and other siblings and relative in [place]. -Her son will help her at home -She has approved for grant to help with medication. Her sister will notify me of the type of grant.  6.Goal of care discussion  -The patient understands the goal of care is palliative. -I recommend DNR/DNI, she will think about  it  7. Hypokalemia  -She is on Lasix and dieretics for her LE edema and heart.  -continue oral K , will increase to tid  -Continue 35-40 ounces of water daily.   8. Genetic testing from 02/19/20 was negative for pathogenetic mutation with VUS of gene NF1  9. LE Edema  -For over the past week she has been more active with moving. She attributes her LE edema to this.  -I discussed pancreatic cancer can increase her risk of blood clots. I recommend doppler to evaluate for this. She is agreeable.    PLAN: -Restaging CT scan images reviewed, which showed a mixed response, with worsening liver metastasis.  I refilled Zofran and Compazine.  -Labs reviewed and adequate to proceed with Gem/Abraxane with Abraxane dose reduction today.  Plan to change chemo after today due to overall disease progression. -Doppler in LE at [facility] to rule out a DVT. -Lab, flush, F/u and FOLFIRI with liposomal irinotecan in 2 and 4 weeks.  We will reduce the dose irinotecan and 5-FU dose for first cycle, no G-CSF.   No problem-specific Assessment & Plan notes found for this encounter.   No orders of the defined types were placed in this encounter.  All questions were answered. The patient knows to call the clinic with any problems, questions or concerns. No barriers to learning was detected. The total time spent in the appointment was 40 minutes.     Truitt Merle, MD 05/19/2020   I, Joslyn Devon, am acting as  scribe for Truitt Merle, MD.   I have reviewed the above documentation for accuracy and completeness, and I agree with the above.   Addendum -Her Doppler of bilateral lower extremity was negative for DVT today.  Truitt Merle  05/19/2020

## 2020-05-14 NOTE — Progress Notes (Signed)
[facility])                                            [place] Team                                        Statin Quality Measure Assessment    05/14/2020  Mariah Henderson 06-16-1950 [id number]  Per review of chart and payor information, this patient has been flagged for non-adherence to the following CMS Quality Measure:   [x]  Statin Use in Persons with Diabetes  []  Statin Use in Persons with Cardiovascular Disease  The ASCVD Risk score Mikey Bussing DC Jr., et al., 2013) failed to calculate for the following reasons:   Cannot find a previous HDL lab   Cannot find a previous total cholesterol lab  No results found for requested labs within last 26280 hours.  Currently prescribed statin:  [x]  Yes []  No     Comments: atorvastatin 40 mg QD -  Last prescription wrote on 01/14/2020 with no refills. Per pharmacy claims patient has not filled medication. I called pt to discuss whether atorvastatin was discontinued d/t elevated LFTs, but was unable to reach patient.  Per notes, patient has pancreaticcancer withlivermetastasis, stage IVand recent labs with elevated LFTs. At this time, I will assume atorvastatin may have been stopped d/t consistently elevated LFTs.    Please consider the following recommendations:   Please consider associating K71.9 (toxic liver disease) at the next o/v given patient has pancreaticcancer withlivermetastasis (stage IV) with consistently elevated LFTs.   Thank you for your time,   Kristeen Miss, [place] Cell: [phone]

## 2020-05-19 ENCOUNTER — Inpatient Hospital Stay: Payer: Medicare Other | Attending: Hematology

## 2020-05-19 ENCOUNTER — Encounter: Payer: Self-pay | Admitting: Hematology

## 2020-05-19 ENCOUNTER — Other Ambulatory Visit: Payer: Self-pay

## 2020-05-19 ENCOUNTER — Inpatient Hospital Stay (HOSPITAL_BASED_OUTPATIENT_CLINIC_OR_DEPARTMENT_OTHER): Payer: Medicare Other | Admitting: Hematology

## 2020-05-19 ENCOUNTER — Ambulatory Visit (HOSPITAL_COMMUNITY)
Admission: RE | Admit: 2020-05-19 | Discharge: 2020-05-19 | Disposition: A | Discharge status: Home / Self Care | Payer: Medicare Other | Source: Ambulatory Visit | Attending: Hematology | Admitting: Hematology

## 2020-05-19 ENCOUNTER — Inpatient Hospital Stay: Payer: Medicare Other

## 2020-05-19 ENCOUNTER — Other Ambulatory Visit: Payer: Medicare Other

## 2020-05-19 VITALS — BP 161/91 | HR 87 | Temp 97.5°F | Resp 18 | Ht 60.0 in | Wt 141.0 lb

## 2020-05-19 DIAGNOSIS — Z7189 Other specified counseling: Secondary | ICD-10-CM | POA: Diagnosis not present

## 2020-05-19 DIAGNOSIS — C787 Secondary malignant neoplasm of liver and intrahepatic bile duct: Secondary | ICD-10-CM | POA: Insufficient documentation

## 2020-05-19 DIAGNOSIS — C252 Malignant neoplasm of tail of pancreas: Secondary | ICD-10-CM

## 2020-05-19 DIAGNOSIS — Z452 Encounter for adjustment and management of vascular access device: Secondary | ICD-10-CM | POA: Insufficient documentation

## 2020-05-19 DIAGNOSIS — Z95828 Presence of other vascular implants and grafts: Secondary | ICD-10-CM

## 2020-05-19 DIAGNOSIS — Z5111 Encounter for antineoplastic chemotherapy: Secondary | ICD-10-CM | POA: Insufficient documentation

## 2020-05-19 LAB — CBC WITH DIFFERENTIAL (CANCER CENTER ONLY)
Abs Immature Granulocytes: 0.01 10*3/uL (ref 0.00–0.07)
Basophils Absolute: 0.1 10*3/uL (ref 0.0–0.1)
Basophils Relative: 1 %
Eosinophils Absolute: 0.4 10*3/uL (ref 0.0–0.5)
Eosinophils Relative: 6 %
HCT: 41.7 % (ref 36.0–46.0)
Hemoglobin: 13.3 g/dL (ref 12.0–15.0)
Immature Granulocytes: 0 %
Lymphocytes Relative: 7 %
Lymphs Abs: 0.5 10*3/uL — ABNORMAL LOW (ref 0.7–4.0)
MCH: 28.4 pg (ref 26.0–34.0)
MCHC: 31.9 g/dL (ref 30.0–36.0)
MCV: 89.1 fL (ref 80.0–100.0)
Monocytes Absolute: 0.6 10*3/uL (ref 0.1–1.0)
Monocytes Relative: 9 %
Neutro Abs: 5.5 10*3/uL (ref 1.7–7.7)
Neutrophils Relative %: 77 %
Platelet Count: 157 10*3/uL (ref 150–400)
RBC: 4.68 MIL/uL (ref 3.87–5.11)
RDW: 18.2 % — ABNORMAL HIGH (ref 11.5–15.5)
WBC Count: 7.2 10*3/uL (ref 4.0–10.5)
nRBC: 0 % (ref 0.0–0.2)

## 2020-05-19 LAB — CMP (CANCER CENTER ONLY)
ALT: 36 U/L (ref 0–44)
AST: 61 U/L — ABNORMAL HIGH (ref 15–41)
Albumin: 2.8 g/dL — ABNORMAL LOW (ref 3.5–5.0)
Alkaline Phosphatase: 345 U/L — ABNORMAL HIGH (ref 38–126)
Anion gap: 9 (ref 5–15)
BUN: 9 mg/dL (ref 8–23)
CO2: 30 mmol/L (ref 22–32)
Calcium: 9.2 mg/dL (ref 8.9–10.3)
Chloride: 101 mmol/L (ref 98–111)
Creatinine: 0.76 mg/dL (ref 0.44–1.00)
GFR, Estimated: >60 mL/min (ref >60)
Glucose, Bld: 226 mg/dL — ABNORMAL HIGH (ref 70–99)
Potassium: 3.5 mmol/L (ref 3.5–5.1)
Sodium: 140 mmol/L (ref 135–145)
Total Bilirubin: 1.3 mg/dL — ABNORMAL HIGH (ref 0.3–1.2)
Total Protein: 6.8 g/dL (ref 6.5–8.1)

## 2020-05-19 MED ORDER — PROCHLORPERAZINE MALEATE 10 MG PO TABS
10.0000 mg | ORAL_TABLET | Freq: Once | ORAL | Status: AC
  Administered 2020-05-19: 10 mg via ORAL

## 2020-05-19 MED ORDER — PROCHLORPERAZINE MALEATE 10 MG PO TABS
ORAL_TABLET | ORAL | Status: AC
  Filled 2020-05-19: qty 1

## 2020-05-19 MED ORDER — ONDANSETRON HCL 8 MG PO TABS: 8.0000 mg | ORAL_TABLET | Freq: Two times a day (BID) | ORAL | 1 refills | Status: DC | PRN

## 2020-05-19 MED ORDER — SODIUM CHLORIDE 0.9 % IV SOLN
1000.0000 mg/m2 | Freq: Once | INTRAVENOUS | Status: AC
  Administered 2020-05-19: 1672 mg via INTRAVENOUS
  Filled 2020-05-19: qty 43.97

## 2020-05-19 MED ORDER — ALTEPLASE 2 MG IJ SOLR
2.0000 mg | Freq: Once | INTRAMUSCULAR | Status: AC | PRN
Start: 2020-05-19 — End: 2020-05-19
  Administered 2020-05-19: 2 mg
  Filled 2020-05-19: qty 2

## 2020-05-19 MED ORDER — SODIUM CHLORIDE 0.9 % IV SOLN
Freq: Once | INTRAVENOUS | Status: AC
  Filled 2020-05-19: qty 250

## 2020-05-19 MED ORDER — SODIUM CHLORIDE 0.9% FLUSH
10.0000 mL | INTRAVENOUS | Status: DC | PRN
  Administered 2020-05-19: 10 mL
  Filled 2020-05-19: qty 10

## 2020-05-19 MED ORDER — HEPARIN SOD (PORK) LOCK FLUSH 100 UNIT/ML IV SOLN
500.0000 [IU] | Freq: Once | INTRAVENOUS | Status: AC | PRN
  Administered 2020-05-19: 500 [IU]
  Filled 2020-05-19: qty 5

## 2020-05-19 MED ORDER — PROCHLORPERAZINE MALEATE 10 MG PO TABS: 10.0000 mg | ORAL_TABLET | Freq: Four times a day (QID) | ORAL | 1 refills | Status: DC | PRN

## 2020-05-19 MED ORDER — ALTEPLASE 2 MG IJ SOLR
INTRAMUSCULAR | Status: AC
  Filled 2020-05-19: qty 2

## 2020-05-19 MED ORDER — PACLITAXEL PROTEIN-BOUND CHEMO INJECTION 100 MG
60.0000 mg/m2 | Freq: Once | INTRAVENOUS | Status: AC
  Administered 2020-05-19: 100 mg via INTRAVENOUS
  Filled 2020-05-19: qty 20

## 2020-05-19 MED ORDER — ALTEPLASE 2 MG IJ SOLR
2.0000 mg | Freq: Once | INTRAMUSCULAR | Status: DC
  Filled 2020-05-19: qty 2

## 2020-05-19 MED ORDER — SODIUM CHLORIDE 0.9% FLUSH
10.0000 mL | Freq: Once | INTRAVENOUS | Status: AC
  Administered 2020-05-19: 10 mL
  Filled 2020-05-19: qty 10

## 2020-05-19 NOTE — Progress Notes (Signed)
68 RN accessed pt's PAC. Sluggish blood return noted despite multiple flushes and attempts to get blood return. Cath flow given. Labs drawn from peripheral.

## 2020-05-19 NOTE — Patient Instructions (Signed)
[facility]  Discharge Instructions: ?Thank you for choosing [facility] to provide your oncology and hematology care.  ? ?If you have a lab appointment with the Cancer Center, please go directly to the Cancer Center and check in at the registration area. ?  ?Wear comfortable clothing and clothing appropriate for easy access to any Portacath or PICC line.  ? ?We strive to give you quality time with your provider. You may need to reschedule your appointment if you arrive late (15 or more minutes).  Arriving late affects you and other patients whose appointments are after yours.  Also, if you miss three or more appointments without notifying the office, you may be dismissed from the clinic at the provider?s discretion.    ?  ?For prescription refill requests, have your pharmacy contact our office and allow 72 hours for refills to be completed.   ? ?Today you received the following chemotherapy and/or immunotherapy agents: Abraxane/Gemzar.    ?  ?To help prevent nausea and vomiting after your treatment, we encourage you to take your nausea medication as directed. ? ?BELOW ARE SYMPTOMS THAT SHOULD BE REPORTED IMMEDIATELY: ?*FEVER GREATER THAN 100.4 F (38 ?C) OR HIGHER ?*CHILLS OR SWEATING ?*NAUSEA AND VOMITING THAT IS NOT CONTROLLED WITH YOUR NAUSEA MEDICATION ?*UNUSUAL SHORTNESS OF BREATH ?*UNUSUAL BRUISING OR BLEEDING ?*URINARY PROBLEMS (pain or burning when urinating, or frequent urination) ?*BOWEL PROBLEMS (unusual diarrhea, constipation, pain near the anus) ?TENDERNESS IN MOUTH AND THROAT WITH OR WITHOUT PRESENCE OF ULCERS (sore throat, sores in mouth, or a toothache) ?UNUSUAL RASH, SWELLING OR PAIN  ?UNUSUAL VAGINAL DISCHARGE OR ITCHING  ? ?Items with * indicate a potential emergency and should be followed up as soon as possible or go to the Emergency Department if any problems should occur. ? ?Please show the CHEMOTHERAPY ALERT CARD or IMMUNOTHERAPY ALERT CARD at  check-in to the Emergency Department and triage nurse. ? ?Should you have questions after your visit or need to cancel or reschedule your appointment, please contact [facility]  Dept: [phone]  and follow the prompts.  Office hours are 8:00 a.m. to 4:30 p.m. Monday - Friday. Please note that voicemails left after 4:00 p.m. may not be returned until the following business day.  We are closed weekends and major holidays. You have access to a nurse at all times for urgent questions. Please call the main number to the clinic Dept: [phone] and follow the prompts. ? ? ?For any non-urgent questions, you may also contact your provider using MyChart. We now offer e-Visits for anyone 18 and older to request care online for non-urgent symptoms. For details visit [email or web address]. ?  ?Also download the MyChart app! Go to the app store, search "MyChart", open the app, select [facility], and log in with your MyChart username and password. ? ?Due to Covid, a mask is required upon entering the hospital/clinic. If you do not have a mask, one will be given to you upon arrival. For doctor visits, patients may have 1 support person aged 18 or older with them. For treatment visits, patients cannot have anyone with them due to current Covid guidelines and our immunocompromised population.  ? ?

## 2020-05-19 NOTE — Progress Notes (Signed)
Bilateral lower extremity venous duplex has been completed. Preliminary results can be found in CV Proc through chart review.  Results were given to Santiago Glad at Dr. Ernestina Penna office.  05/19/20 3:53 PM Carlos Levering RVT

## 2020-05-19 NOTE — Progress Notes (Signed)
DISCONTINUE ON PATHWAY REGIMEN - Pancreatic Adenocarcinoma     A cycle is every 28 days:     Nab-paclitaxel (protein bound)      Gemcitabine   **Always confirm dose/schedule in your pharmacy ordering system**  REASON: Disease Progression PRIOR TREATMENT: PANOS51: Nab-Paclitaxel (Abraxane) 125 mg/m2 D1, 8, 15 + Gemcitabine 1,000 mg/m2 D1, 8, 15 q28 Days Until Progression or Toxicity TREATMENT RESPONSE: Progressive Disease (PD)  START ON PATHWAY REGIMEN - Pancreatic Adenocarcinoma     A cycle is every 14 days:     Liposomal irinotecan      Leucovorin      Fluorouracil   **Always confirm dose/schedule in your pharmacy ordering system**  Patient Characteristics: Metastatic Disease, Second Line, MSS/pMMR or MSI Unknown, Gemcitabine-Based Therapy First Line Therapeutic Status: Metastatic Disease Line of Therapy: Second Line Microsatellite/Mismatch Repair Status: MSS/pMMR Intent of Therapy: Non-Curative / Palliative Intent, Discussed with Patient

## 2020-05-20 ENCOUNTER — Other Ambulatory Visit: Payer: Self-pay | Admitting: Hematology

## 2020-05-21 ENCOUNTER — Other Ambulatory Visit: Payer: Self-pay | Admitting: Hematology

## 2020-05-25 ENCOUNTER — Telehealth: Payer: Self-pay | Admitting: Hematology

## 2020-05-25 NOTE — Telephone Encounter (Signed)
Left message with follow-up appointments per 5/4 los. Gave option to call back to reschedule if needed. 

## 2020-05-26 NOTE — Progress Notes (Unsigned)
Pharmacist Chemotherapy Monitoring - Initial Assessment    Anticipated start date: 06/02/20   Regimen:  . Are orders appropriate based on the patient's diagnosis, regimen, and cycle? Yes . Does the plan date match the patient's scheduled date? Yes . Is the sequencing of drugs appropriate? Yes . Are the premedications appropriate for the patient's regimen? Yes . Prior Authorization for treatment is: Pending o If applicable, is the correct biosimilar selected based on the patient's insurance? not applicable  Organ Function and Labs: Marland Kitchen Are dose adjustments needed based on the patient's renal function, hepatic function, or hematologic function? Yes . Are appropriate labs ordered prior to the start of patient's treatment? Yes . Other organ system assessment, if indicated: N/A . The following baseline labs, if indicated, have been ordered: N/A  Dose Assessment: . Are the drug doses appropriate? Yes . Are the following correct: o Drug concentrations Yes o IV fluid compatible with drug Yes o Administration routes Yes o Timing of therapy Yes . If applicable, does the patient have documented access for treatment and/or plans for port-a-cath placement? yes . If applicable, have lifetime cumulative doses been properly documented and assessed? not applicable Lifetime Dose Tracking  No doses have been documented on this patient for the following tracked chemicals: Doxorubicin, Epirubicin, Idarubicin, Daunorubicin, Mitoxantrone, Bleomycin, Oxaliplatin, Carboplatin, Liposomal Doxorubicin  o   Toxicity Monitoring/Prevention: . The patient has the following take home antiemetics prescribed: Ondansetron, Prochlorperazine, Dexamethasone and Lorazepam . The patient has the following take home medications prescribed: N/A . Medication allergies and previous infusion related reactions, if applicable, have been reviewed and addressed. No . The patient's current medication list has been assessed for  drug-drug interactions with their chemotherapy regimen. no significant drug-drug interactions were identified on review.  Order Review: . Are the treatment plan orders signed? Yes . Is the patient scheduled to see a provider prior to their treatment? {yes/no:20286}  I verify that I have reviewed each item in the above checklist and answered each question accordingly.  Larene Beach, [name], 05/26/2020  2:17 PM

## 2020-05-28 ENCOUNTER — Telehealth: Payer: Self-pay | Admitting: Internal Medicine

## 2020-05-28 NOTE — Telephone Encounter (Signed)
Pt reporting her feet are really swollen. Pt is also requesting a medication Refill  meclizine (ANTIVERT) 12.5 MG tablet  [facility] [phone] - [place], [place] AT [address] (Ph: [phone]

## 2020-05-28 NOTE — Telephone Encounter (Signed)
Return pt's call - stated she's having tingling in her hands which she  will make her oncologist aware. Also c/o feet swollen she wanted to let Dr Daryll Drown aware. Her next chemo tx will be Wednesday - she wanted to know should she wait until then to let them know about her concerns? Also asked her to call the pharmacy about the [place], she should have 1 more refill if not to call our office back.

## 2020-05-28 NOTE — Progress Notes (Signed)
[place]   [phone] [phone]   Clinic Follow up Note   Patient Care Team: Sid Falcon, MD as PCP - General (Internal Medicine) Forrest Moron, [place] (Optometry) Jonnie Finner, RN as Oncology Nurse Navigator Truitt Merle, MD as Consulting Physician (Oncology) Gwyndolyn Kaufman, RN as Registered Nurse  Date of Service:  06/02/2020  CHIEF COMPLAINT: F/u Pancreaticcancer withliver metastasis  SUMMARY OF ONCOLOGIC HISTORY: Oncology History Overview Note  Cancer Staging Pancreatic cancer [facility]) Staging form: Exocrine Pancreas, AJCC 8th Edition - Clinical: Stage IV (cT1c, cN0, pM1) - Signed by Truitt Merle, MD on 02/26/2020 Total positive nodes: 0    Pancreatic cancer ([place])  01/13/2020 Imaging   US Abdomen 01/13/20  IMPRESSION: 1. Suggestion of 3 vascular hypoechoic mass within the liver measuring up to 2.5 cm. 2. Suggestion of a hypoechoic mass within the tail of the pancreas that is not well visualized. 3. Findings concerning for malignancy, please see separately dictated CT abdomen pelvis 01/13/2020.   01/13/2020 Imaging   CT AP 01/13/20  IMPRESSION: 1. Hypoenhancing 2.0 cm in long axis mass in the pancreatic tail compatible with pancreatic adenocarcinoma. The mass abuts the posterior gastric wall but without definite gastric invasion. The splenic vein appears narrowed/attenuated compared to prior exams and the lesion abuts the margin of the splenic artery. 2. Hypoenhancing hepatic masses are new compared to 11/22/2017 and are highly suspicious for metastatic lesions. 3. Questionable 0.8 by 0.8 cm omental tumor nodule versus dense diverticulum above the transverse colon. 4. Other imaging findings of potential clinical significance: Mild distal esophageal wall thickening, query esophagitis. Stable left adrenal adenoma. Sigmoid colon diverticulosis. Lumbar and thoracic spondylosis and degenerative disc disease contributing to  multilevel impingement. 5. Aortic atherosclerosis. Aortic Atherosclerosis (ICD10-I70.0).   01/21/2020 Initial Diagnosis   Pancreatic cancer ([place])   01/28/2020 Initial Biopsy   FINAL MICROSCOPIC DIAGNOSIS:   A. LIVER, NEEDLE CORE BIOPSY:  - Adenocarcinoma.   COMMENT:   Immunohistochemistry for CK7 is positive.  CDX-2 demonstrates weak to  moderate positive staining.  TTF-1 and PAX 8 demonstrate weak, likely  nonspecific staining.  CK20, GATA-3 and ER are negative.  The  morphologic and immunophenotypic characteristics are compatible with the  clinical impression of a primary pancreatic cancer.  Radiologic  correlation is encouraged.  If applicable, there is likely sufficient  tissue for ancillary studies (Block A2).  Dr. Vic Ripper reviewed the  case.    02/26/2020 - 05/19/2020 Chemotherapy   First-line Gemcitabine and Abraxane every 2 weeks starting 02/26/20. D/c after 05/19/20 due to disease progression in liver and neuropathy     02/26/2020 Cancer Staging   Staging form: Exocrine Pancreas, AJCC 8th Edition - Clinical: Stage IV (cT1c, cN0, pM1) - Signed by Truitt Merle, MD on 02/26/2020 Total positive nodes: 0   03/08/2020 Genetic Testing   Negative hereditary cancer genetic testing: no pathogenic variants detected in [facility] Common Hereditary Cancers Panel with preliminary evidence pancreatic cancer genes.  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 The report date is March 08, 2020.    The Common Hereditary Cancers Panel with preliminary evidence pancreatic cancer genes offered by [facility] includes sequencing and/or deletion duplication testing of the following 49 genes: APC, ATM, AXIN2, BARD1, BMPR1A, BRCA1, BRCA2, BRIP1, CDH1, CDK4, CDKN2A (p14ARF), CDKN2A (p16INK4a), CHEK2, CTNNA1, DICER1, EPCAM (Deletion/duplication testing only), GREM1 (promoter region deletion/duplication testing only), FANCC, GREM1, HOXB13, KIT, MEN1, MLH1, MSH2, MSH3, MSH6, MUTYH,  NBN, NF1, NHTL1, PALB2, PALLD, PDGFRA, PMS2,  POLD1, POLE, PTEN, RAD50, RAD51C, RAD51D, SDHA, SDHB, SDHC, SDHD, SMAD4, SMARCA4. STK11, TP53, TSC1, TSC2, and VHL.  The following genes were evaluated for sequence changes only: SDHA and HOXB13 c.251G>A variant only.es only: SDHA and HOXB13 c.251G>A variant only.   05/03/2020 Imaging   CT AP  IMPRESSION: 1. Interval decrease in size of a hypodense mass of the central pancreatic tail, consistent with treatment response of primary pancreatic malignancy. 2. Numerous low-attenuation liver lesions are seen, increased in size and number compared to prior examination. Findings are consistent with worsened hepatic metastatic disease. 3. A previously queried omental nodule adjacent to the transverse colon is more clearly a prominent diverticulum on current examination.   Aortic Atherosclerosis (ICD10-I70.0).   06/02/2020 -  Chemotherapy   Second-line FOLFIRI q2weeks starting 06/02/20       CURRENT THERAPY:  Second-line FOLFIRI q2weeks starting 06/02/20.   INTERVAL HISTORY:  Mariah Henderson is here for a follow up. She was last seen by me 05/19/20. She presents to the clinic with her sister. She notes significant LE edema of her ankles and feet. She has not been resting or elevating her feet. She remains active on her feet. I reviewed her medication list with her.  She notes her hand function has worsened from neuropathy.    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  MEDICAL HISTORY:  Past Medical History:  Diagnosis Date  . Acute medial meniscus tear    right knee  . Anxiety   . Asthma   . Cancer ([place])   . Cervical spondylosis   . Chronic serous otitis media   . Constipation   . COPD (chronic obstructive pulmonary disease) ([place])   . Depression    followed by [name]; no longer of depakote, seroquel  . Diabetes mellitus without complication ([place])   . Dysuria 12/27/2009   Qualifier: Diagnosis of  By: [name] MD, Amanjot    . Foot drop   . GERD (gastroesophageal reflux disease)   . Heart murmur   . Hyperlipidemia   . Hypertension   . Low back pain   . Obesity   . Paraumbilical hernia   . Pre-diabetes     SURGICAL HISTORY: Past Surgical History:  Procedure Laterality Date  . ABDOMINAL HYSTERECTOMY    . ANTERIOR CERVICAL DECOMP/DISCECTOMY FUSION N/A 10/05/2014   Procedure: ACDF - C5-C6 - C6-C7;  Surgeon: Karie Chimera, MD;  Location: [facility];  Service: Neurosurgery;  Laterality: N/A;  ACDF - C5-C6 - C6-C7  . CHOLECYSTECTOMY    . COLONOSCOPY    . FOOT SURGERY    . HERNIA REPAIR  1999  . IR IMAGING GUIDED PORT INSERTION  02/25/2020  . IR US GUIDANCE  01/28/2020  . KNEE ARTHROSCOPY WITH MEDIAL MENISECTOMY Right 06/14/2016   Procedure: RIGHT KNEE ARTHROSCOPY WITH PARTIAL MEDIAL MENISCECTOMY, SUBCHONDROPLASTY;  Surgeon: Leandrew Koyanagi, MD;  Location: [place];  Service: Orthopedics;  Laterality: Right;  . KNEE ARTHROSCOPY WITH SUBCHONDROPLASTY Right 06/14/2016   Procedure: KNEE  ARTHROSCOPY WITH SUBCHONDROPLASTY;  Surgeon: Leandrew Koyanagi, MD;  Location: [place];  Service: Orthopedics;  Laterality: Right;  . RADIOACTIVE SEED GUIDED EXCISIONAL BREAST BIOPSY Left 06/23/2014   Procedure: RADIOACTIVE SEED GUIDED EXCISIONAL BREAST BIOPSY;  Surgeon: Stark Klein, MD;  Location: [place];  Service: General;  Laterality: Left;    I have reviewed the social history and family history with the patient and they are unchanged from previous note.  ALLERGIES:  has No Known Allergies.  MEDICATIONS:  Current Outpatient Medications  Medication Sig Dispense Refill  . glipiZIDE (GLUCOTROL XL) 2.5 MG 24 hr tablet TAKE 1 TABLET(2.5 MG) BY MOUTH DAILY WITH BREAKFAST 30 tablet 2  . albuterol (PROVENTIL) (2.5 MG/3ML) 0.083% nebulizer solution Take 3 mLs (2.5 mg total) by nebulization every 6 (six) hours as needed for wheezing. 1080 mL 3  . albuterol (VENTOLIN HFA) 108 (90 Base) MCG/ACT inhaler INHALE 1 TO 2 PUFFS INTO THE LUNGS EVERY 6 HOURS AS NEEDED FOR WHEEZING OR SHORTNESS OF BREATH 54 g 1  . alendronate (FOSAMAX) 70 MG tablet TAKE 1 TABLET BY MOUTH EVERY 7 DAYS. TAKE WITH A FULL GLASS OF WATER ON AN EMPTY STOMACH. 12 tablet 3  . atorvastatin (LIPITOR) 40 MG tablet Take 1 tablet (40 mg total) by mouth daily. 90 tablet 3  . benazepril (LOTENSIN) 20 MG tablet TAKE 1 TABLET(20 MG) BY MOUTH DAILY 90 tablet 3  . calcium carbonate (OS-CAL) 600 MG TABS tablet Take by mouth.    . celecoxib (CELEBREX) 100 MG capsule TAKE 1 CAPSULE BY MOUTH  TWICE DAILY 180 capsule 3  . cholecalciferol (VITAMIN D) 1000 units tablet Take 2,000 Units by mouth daily.    . diclofenac Sodium (VOLTAREN) 1 % GEL Apply 2 g topically 4 (four) times daily. 100 g 2  . famotidine (PEPCID) 40 MG tablet TAKE 1/2 TABLET(20 MG) BY MOUTH TWICE DAILY 30 tablet 3  . fluticasone (FLONASE) 50 MCG/ACT nasal spray Place 1 spray into both nostrils daily. 16 g 3  . Fluticasone-Salmeterol (ADVAIR DISKUS) 100-50 MCG/DOSE AEPB Inhale 1 puff into the lungs 2 (two) times daily. 60 each 11  . furosemide (LASIX) 20 MG tablet Take 1 tablet (20 mg total) by mouth daily. Take 1 tab daily for 5 days then as needed for leg edema 30 tablet 0  . glucose blood (ONETOUCH VERIO) test strip Use as instructed 100 each 12  .  hydrochlorothiazide (MICROZIDE) 12.5 MG capsule TAKE 1 CAPSULE(12.5 MG) BY MOUTH DAILY 90 capsule 1  . HYDROcodone-acetaminophen (NORCO/VICODIN) 5-325 MG tablet Take 1 tablet by mouth 2 (two) times daily as needed for severe pain. 60 tablet 0  . ipratropium (ATROVENT) 0.02 % nebulizer solution Take 0.5 mg by nebulization every 6 (six) hours as needed for wheezing or shortness of breath.    Marland Kitchen KLOR-CON 20 MEQ packet DISSOLVE 1 PACKET INTO LIQUID AND DRINK TWICE DAILY 180 each 0  . Lancets (ONETOUCH ULTRASOFT) lancets Use as instructed 100 each 12  . latanoprost (XALATAN) 0.005 % ophthalmic solution PLACE 1 DROP INTO BOTH EYES AT BEDTIME  0  . loperamide (LOPERAMIDE A-D) 2 MG tablet Take 1 tablet (2 mg total) by mouth 2 (two) times daily as needed for diarrhea or loose stools. 6 tablet 0  . loratadine (CLARITIN) 10 MG tablet Take 1 tablet (10 mg total) by mouth daily. 30 tablet 11  . magic mouthwash w/lidocaine SOLN Take 5 mLs by mouth 4 (four) times daily as needed for mouth  pain. 480 mL 2  . meclizine (ANTIVERT) 12.5 MG tablet TAKE 1 TABLET(12.5 MG) BY MOUTH TWICE DAILY AS NEEDED FOR DIZZINESS 15 tablet 3  . risperiDONE (RISPERDAL) 0.25 MG tablet Take 0.25 mg by mouth daily as needed.    . sertraline (ZOLOFT) 50 MG tablet Take 50 mg by mouth daily.    . traZODone (DESYREL) 50 MG tablet Take 25-50 mg by mouth at bedtime as needed for sleep.     Marland Kitchen triamcinolone cream (KENALOG) 0.1 % apply to affected area twice a day 454 g 1   No current facility-administered medications for this visit.   Facility-Administered Medications Ordered in Other Visits  Medication Dose Route Frequency Provider Last Rate Last Admin  . fluorouracil (ADRUCIL) 3,300 mg in sodium chloride 0.9 % 84 mL chemo infusion  2,000 mg/m2 (Treatment Plan Recorded) Intravenous 1 day or 1 dose Truitt Merle, MD   3,300 mg at 06/02/20 1721  . heparin lock flush 100 unit/mL  500 Units Intracatheter Once PRN Truitt Merle, MD      . sodium chloride  flush (NS) 0.9 % injection 10 mL  10 mL Intracatheter PRN Truitt Merle, MD        PHYSICAL EXAMINATION: ECOG PERFORMANCE STATUS: 1 - Symptomatic but completely ambulatory  Vitals:   06/02/20 1348  BP: (!) 145/80  Pulse: 80  Resp: 17  Temp: (!) 97.4 F (36.3 C)  SpO2: 100%   Filed Weights   06/02/20 1348  Weight: 144 lb 3.2 oz (65.4 kg)    Due to COVID19 we will limit examination to appearance. Patient had no complaints.  GENERAL:alert, no distress and comfortable SKIN: skin color normal, no rashes or significant lesions EYES: normal, Conjunctiva are pink and non-injected, sclera clear  NEURO: alert & oriented x 3 with fluent speech (+) LE edema in ankles and feet  LABORATORY DATA:  I have reviewed the data as listed CBC Latest Ref Rng & Units 06/02/2020 05/19/2020 04/21/2020  WBC 4.0 - 10.5 K/uL 3.8(L) 7.2 4.2  Hemoglobin 12.0 - 15.0 g/dL 11.4(L) 13.3 11.1(L)  Hematocrit 36.0 - 46.0 % 35.3(L) 41.7 35.5(L)  Platelets 150 - 400 K/uL 197 157 228     CMP Latest Ref Rng & Units 06/02/2020 05/19/2020 04/21/2020  Glucose 70 - 99 mg/dL 205(H) 226(H) 252(H)  BUN 8 - 23 mg/dL '10 9 9  ' Creatinine 0.44 - 1.00 mg/dL 0.70 0.76 0.71  Sodium 135 - 145 mmol/L 141 140 139  Potassium 3.5 - 5.1 mmol/L 3.7 3.5 3.1(L)  Chloride 98 - 111 mmol/L 102 101 100  CO2 22 - 32 mmol/L '31 30 27  ' Calcium 8.9 - 10.3 mg/dL 9.4 9.2 8.5(L)  Total Protein 6.5 - 8.1 g/dL 6.5 6.8 6.3(L)  Total Bilirubin 0.3 - 1.2 mg/dL 1.0 1.3(H) 1.0  Alkaline Phos 38 - 126 U/L 249(H) 345(H) 441(H)  AST 15 - 41 U/L 51(H) 61(H) 59(H)  ALT 0 - 44 U/L 27 36 36      RADIOGRAPHIC STUDIES: I have personally reviewed the radiological images as listed and agreed with the findings in the report. No results found.   ASSESSMENT & PLAN:  Mariah Henderson is a 70 y.o. female with    1. Pancreaticcancer withLivermetastasis, stage IV -Her CT AP from 01/13/20 showed 2cm mass on tail of pancreas.The mass abuts the posterior gastric wall  but without definite gastric invasion.Scan also showsmultiple (at least5)hepatic masses highly suspicious for metastatic lesionsandQuestionable 0.8 by 0.8 cm omental tumor nodule versus dense diverticulum above  the transverse colon. -Herliver biopsy from 01/28/20 showed adenocarcinoma from pancreatic primary. Her 02/11/20 CT Chestshowed no metastasis -I started her on first linechemo with gemcitabine andAbraxaneevery 2 weeks beginning 02/26/20.D/c after 05/19/20 due to disease progression in liver and chemo related neuropathy.  -To better control her disease, I recommend Second-line FOLFIRI q2weeks starting 06/02/20. I reviewed AEs again. She agrees to proceed  -Labs reviewed, WBC 3.8, Hg 11.4. Overall adequate to proceed with C1 FOLFIRI today. I will start her at lower dose to see if she will tolerate well   -F/u in 2 weeks.   2. Abdominal Pain, low appetite, weight loss, Transmanitis  -Shehadabdominal pain for the past monthbefore cancer diagnosis, 5/10.She is on Norco 5-339m BID as needed.Her pain has improved since chemoand not mentioned lately.  -She had sudden loss of appetite and weight loss since October 2021. She has overall lost 10 pounds.she has gained some weight back lately -On Chemo her appetite has been able to increase and eating more.   3. Comorbidities: Asthma, Borderline Diabetic, GERD, Heart murmur, HLD, HTN, arthritis  -On medications, including multiple diuretics.  -Managed by PCP.  4. Social support -She lives alone in 1 level house and she is retired. She has 1 adult son who lives in [place] She has a twin and 3 other sisters who live in [place] and other siblings and relative in [place] -Her son will help her at home -She has approved for grant to help with medication. Her sister will notify me of the type of grant.  6.Goal of care discussion  -The patient understands the goal of care is palliative. -I recommend DNR/DNI, she will think about  it  7. Hypokalemia  -She is on Lasix and dieretics for her LE edema and heart. -continue oral K , will increase to tid -Continue 35-40 ounces of water daily.   8. Genetic testing from 02/19/20 was negative for pathogenetic mutation with VUS of gene NF1  9. LE Edema  -Since early May, she has been more active with moving. She attributes her LE edema to this.  -Her 05/19/2020 doppler was negative for blood clot.  -Her LE edema worsened in last 2 weeks. I will restart her on Lasix 282mwith oral potassium.  I also encouraged her to elevate her feet and ware compression stocking. She is agreeable.    PLAN: -Labs reviewed and adequate to proceed with C1 FOLFIRI today at reduced dose.  -Lab, flush, F/u and FOLFIRI in 2, 4, 6 weeks. My increase chemo dose next cycle based on her tolerance    No problem-specific Assessment & Plan notes found for this encounter.   No orders of the defined types were placed in this encounter.  All questions were answered. The patient knows to call the clinic with any problems, questions or concerns. No barriers to learning was detected. The total time spent in the appointment was 30 minutes.     YaTruitt MerleMD 06/02/2020   I, AmJoslyn Devonam acting as scribe for YaTruitt MerleMD.   I have reviewed the above documentation for accuracy and completeness, and I agree with the above.

## 2020-05-30 ENCOUNTER — Other Ambulatory Visit: Payer: Self-pay | Admitting: Internal Medicine

## 2020-05-30 DIAGNOSIS — I10 Essential (primary) hypertension: Secondary | ICD-10-CM

## 2020-05-31 NOTE — Telephone Encounter (Signed)
I would have her go ahead and call the oncologist office to let them know.

## 2020-06-01 ENCOUNTER — Other Ambulatory Visit: Payer: Self-pay

## 2020-06-01 DIAGNOSIS — C252 Malignant neoplasm of tail of pancreas: Secondary | ICD-10-CM

## 2020-06-01 NOTE — Telephone Encounter (Signed)
Pt called / informed to let her oncologist be aware of her concerns with her hands and feet. Stated she will.

## 2020-06-02 ENCOUNTER — Other Ambulatory Visit: Payer: Self-pay | Admitting: Internal Medicine

## 2020-06-02 ENCOUNTER — Other Ambulatory Visit: Payer: Self-pay

## 2020-06-02 ENCOUNTER — Other Ambulatory Visit: Payer: Medicare Other

## 2020-06-02 ENCOUNTER — Ambulatory Visit: Payer: Medicare Other

## 2020-06-02 ENCOUNTER — Inpatient Hospital Stay: Payer: Medicare Other | Admitting: Hematology

## 2020-06-02 ENCOUNTER — Encounter: Payer: Self-pay | Admitting: Hematology

## 2020-06-02 ENCOUNTER — Inpatient Hospital Stay: Payer: Medicare Other

## 2020-06-02 ENCOUNTER — Ambulatory Visit: Payer: Medicare Other | Admitting: Hematology

## 2020-06-02 ENCOUNTER — Other Ambulatory Visit: Payer: Self-pay | Admitting: Hematology

## 2020-06-02 VITALS — BP 145/80 | HR 80 | Temp 97.4°F | Resp 17 | Wt 144.2 lb

## 2020-06-02 DIAGNOSIS — C252 Malignant neoplasm of tail of pancreas: Secondary | ICD-10-CM

## 2020-06-02 DIAGNOSIS — Z5111 Encounter for antineoplastic chemotherapy: Secondary | ICD-10-CM | POA: Diagnosis not present

## 2020-06-02 DIAGNOSIS — Z7189 Other specified counseling: Secondary | ICD-10-CM

## 2020-06-02 DIAGNOSIS — Z452 Encounter for adjustment and management of vascular access device: Secondary | ICD-10-CM | POA: Diagnosis not present

## 2020-06-02 DIAGNOSIS — Z95828 Presence of other vascular implants and grafts: Secondary | ICD-10-CM

## 2020-06-02 DIAGNOSIS — C787 Secondary malignant neoplasm of liver and intrahepatic bile duct: Secondary | ICD-10-CM | POA: Diagnosis not present

## 2020-06-02 LAB — CBC WITH DIFFERENTIAL (CANCER CENTER ONLY)
Abs Immature Granulocytes: 0.01 10*3/uL (ref 0.00–0.07)
Basophils Absolute: 0.1 10*3/uL (ref 0.0–0.1)
Basophils Relative: 2 %
Eosinophils Absolute: 0.2 10*3/uL (ref 0.0–0.5)
Eosinophils Relative: 6 %
HCT: 35.3 % — ABNORMAL LOW (ref 36.0–46.0)
Hemoglobin: 11.4 g/dL — ABNORMAL LOW (ref 12.0–15.0)
Immature Granulocytes: 0 %
Lymphocytes Relative: 13 %
Lymphs Abs: 0.5 10*3/uL — ABNORMAL LOW (ref 0.7–4.0)
MCH: 28.1 pg (ref 26.0–34.0)
MCHC: 32.3 g/dL (ref 30.0–36.0)
MCV: 87.2 fL (ref 80.0–100.0)
Monocytes Absolute: 0.8 10*3/uL (ref 0.1–1.0)
Monocytes Relative: 21 %
Neutro Abs: 2.2 10*3/uL (ref 1.7–7.7)
Neutrophils Relative %: 58 %
Platelet Count: 197 10*3/uL (ref 150–400)
RBC: 4.05 MIL/uL (ref 3.87–5.11)
RDW: 16.9 % — ABNORMAL HIGH (ref 11.5–15.5)
WBC Count: 3.8 10*3/uL — ABNORMAL LOW (ref 4.0–10.5)
nRBC: 0 % (ref 0.0–0.2)

## 2020-06-02 LAB — CMP (CANCER CENTER ONLY)
ALT: 27 U/L (ref 0–44)
AST: 51 U/L — ABNORMAL HIGH (ref 15–41)
Albumin: 2.7 g/dL — ABNORMAL LOW (ref 3.5–5.0)
Alkaline Phosphatase: 249 U/L — ABNORMAL HIGH (ref 38–126)
Anion gap: 8 (ref 5–15)
BUN: 10 mg/dL (ref 8–23)
CO2: 31 mmol/L (ref 22–32)
Calcium: 9.4 mg/dL (ref 8.9–10.3)
Chloride: 102 mmol/L (ref 98–111)
Creatinine: 0.7 mg/dL (ref 0.44–1.00)
GFR, Estimated: >60 mL/min (ref >60)
Glucose, Bld: 205 mg/dL — ABNORMAL HIGH (ref 70–99)
Potassium: 3.7 mmol/L (ref 3.5–5.1)
Sodium: 141 mmol/L (ref 135–145)
Total Bilirubin: 1 mg/dL (ref 0.3–1.2)
Total Protein: 6.5 g/dL (ref 6.5–8.1)

## 2020-06-02 MED ORDER — PALONOSETRON HCL INJECTION 0.25 MG/5ML
0.2500 mg | Freq: Once | INTRAVENOUS | Status: AC
  Administered 2020-06-02: 0.25 mg via INTRAVENOUS

## 2020-06-02 MED ORDER — SODIUM CHLORIDE 0.9% FLUSH
10.0000 mL | Freq: Once | INTRAVENOUS | Status: DC
  Filled 2020-06-02: qty 10

## 2020-06-02 MED ORDER — PALONOSETRON HCL INJECTION 0.25 MG/5ML
INTRAVENOUS | Status: AC
  Filled 2020-06-02: qty 5

## 2020-06-02 MED ORDER — SODIUM CHLORIDE 0.9 % IV SOLN
400.0000 mg/m2 | Freq: Once | INTRAVENOUS | Status: AC
  Administered 2020-06-02: 660 mg via INTRAVENOUS
  Filled 2020-06-02: qty 33

## 2020-06-02 MED ORDER — FUROSEMIDE 20 MG PO TABS: 20.0000 mg | ORAL_TABLET | Freq: Every day | ORAL | 0 refills | Status: DC

## 2020-06-02 MED ORDER — SODIUM CHLORIDE 0.9 % IV SOLN
2000.0000 mg/m2 | INTRAVENOUS | Status: AC
  Administered 2020-06-02: 3300 mg via INTRAVENOUS
  Filled 2020-06-02: qty 66

## 2020-06-02 MED ORDER — HEPARIN SOD (PORK) LOCK FLUSH 100 UNIT/ML IV SOLN
500.0000 [IU] | Freq: Once | INTRAVENOUS | Status: DC | PRN
  Filled 2020-06-02: qty 5

## 2020-06-02 MED ORDER — SODIUM CHLORIDE 0.9 % IV SOLN
10.0000 mg | Freq: Once | INTRAVENOUS | Status: AC
  Administered 2020-06-02: 10 mg via INTRAVENOUS
  Filled 2020-06-02: qty 10

## 2020-06-02 MED ORDER — SODIUM CHLORIDE 0.9 % IV SOLN
Freq: Once | INTRAVENOUS | Status: AC
  Filled 2020-06-02: qty 250

## 2020-06-02 MED ORDER — SODIUM CHLORIDE 0.9 % IV SOLN
52.0000 mg/m2 | Freq: Once | INTRAVENOUS | Status: AC
  Administered 2020-06-02: 86 mg via INTRAVENOUS
  Filled 2020-06-02: qty 20

## 2020-06-02 MED ORDER — SODIUM CHLORIDE 0.9% FLUSH
10.0000 mL | INTRAVENOUS | Status: DC | PRN
  Filled 2020-06-02: qty 10

## 2020-06-02 NOTE — Patient Instructions (Signed)
[facility]  Discharge Instructions: Thank you for choosing [facility] to provide your oncology and hematology care.   If you have a lab appointment with the [facility], please go directly to the [facility] and check in at the registration area.   Wear comfortable clothing and clothing appropriate for easy access to any Portacath or PICC line.   We strive to give you quality time with your provider. You may need to reschedule your appointment if you arrive late (15 or more minutes).  Arriving late affects you and other patients whose appointments are after yours.  Also, if you miss three or more appointments without notifying the office, you may be dismissed from the clinic at the provider's discretion.      For prescription refill requests, have your pharmacy contact our office and allow 72 hours for refills to be completed.    Today you received the following chemotherapy and/or immunotherapy agents Irinotecan liposomal; leucovorin; fluororocil   To help prevent nausea and vomiting after your treatment, we encourage you to take your nausea medication as directed.  BELOW ARE SYMPTOMS THAT SHOULD BE REPORTED IMMEDIATELY: . *FEVER GREATER THAN 100.4 F (38 C) OR HIGHER . *CHILLS OR SWEATING . *NAUSEA AND VOMITING THAT IS NOT CONTROLLED WITH YOUR NAUSEA MEDICATION . *UNUSUAL SHORTNESS OF BREATH . *UNUSUAL BRUISING OR BLEEDING . *URINARY PROBLEMS (pain or burning when urinating, or frequent urination) . *BOWEL PROBLEMS (unusual diarrhea, constipation, pain near the anus) . TENDERNESS IN MOUTH AND THROAT WITH OR WITHOUT PRESENCE OF ULCERS (sore throat, sores in mouth, or a toothache) . UNUSUAL RASH, SWELLING OR PAIN  . UNUSUAL VAGINAL DISCHARGE OR ITCHING   Items with * indicate a potential emergency and should be followed up as soon as possible or go to the Emergency Department if any problems should occur.  Please show the CHEMOTHERAPY  ALERT CARD or IMMUNOTHERAPY ALERT CARD at check-in to the Emergency Department and triage nurse.  Should you have questions after your visit or need to cancel or reschedule your appointment, please contact [facility]  Dept: [phone]  and follow the prompts.  Office hours are 8:00 a.m. to 4:30 p.m. Monday - Friday. Please note that voicemails left after 4:00 p.m. may not be returned until the following business day.  We are closed weekends and major holidays. You have access to a nurse at all times for urgent questions. Please call the main number to the clinic Dept: [phone] and follow the prompts.   For any non-urgent questions, you may also contact your provider using MyChart. We now offer e-Visits for anyone 82 and older to request care online for non-urgent symptoms. For details visit [email or web address].   Also download the MyChart app! Go to the app store, search "MyChart", open the app, select [facility], and log in with your MyChart username and password.  Due to Covid, a mask is required upon entering the hospital/clinic. If you do not have a mask, one will be given to you upon arrival. For doctor visits, patients may have 1 support person aged 81 or older with them. For treatment visits, patients cannot have anyone with them due to current Covid guidelines and our immunocompromised population.   Irinotecan Liposomal injection What is this medicine? IRINOTECAN LIPOSOME (eye ri noe TEE kan LIP oh som) is a chemotherapy drug. It is used to treat pancreatic cancer. This medicine may be used for other purposes; ask your health  care provider or pharmacist if you have questions. COMMON BRAND NAME(S): ONIVYDE What should I tell my health care provider before I take this medicine? They need to know if you have any of these conditions:  bleeding disorders  dehydration  history of blood diseases, like sickle cell anemia or leukemia  history of low  levels of calcium, magnesium, or potassium in the blood  infection (especially a virus infection such as chickenpox, cold sores, or herpes)  liver disease  low blood counts, like low white cell, platelet, or red cell counts  lung or breathing disease, like asthma  recent or ongoing radiation therapy  an unusual or allergic reaction to irinotecan liposome, other medicines, foods, dyes, or preservatives  pregnant or trying to get pregnant  breast-feeding How should I use this medicine? This drug is given as an infusion into a vein. It is administered in a hospital or clinic by a specially trained health care professional. Talk to your pediatrician regarding the use of this medicine in children. Special care may be needed. Overdosage: If you think you have taken too much of this medicine contact a poison control center or emergency room at once. NOTE: This medicine is only for you. Do not share this medicine with others. What if I miss a dose? It is important not to miss your dose. Call your doctor or health care professional if you are unable to keep an appointment. What may interact with this medicine? This medicine may interact with the following medications:  antiviral medicines for HIV or AIDS  certain medications for fungal infections like ketoconazole, itraconazole, voriconazole  certain medications for seizures like carbamazepine, fosphenytoin, phenytoin  clarithromycin  gemfibrozil  mephobarbital  nefazodone  phenobarbital  primidone  rifabutin  rifampin  rifapentine  St. John's Wort  telaprevir This list may not describe all possible interactions. Give your health care provider a list of all the medicines, herbs, non-prescription drugs, or dietary supplements you use. Also tell them if you smoke, drink alcohol, or use illegal drugs. Some items may interact with your medicine. What should I watch for while using this medicine? Check with your doctor or  health care professional if you get an attack of severe diarrhea, nausea and vomiting, or if you sweat a lot. The loss of too much body fluid can make it dangerous for you to take this medicine. This medicine may interfere with the ability to have a child. You should talk with your doctor or health care professional if you are concerned about your fertility. Do not become pregnant while taking this medicine or for 1 month after the last dose; males with female partners should use condoms during treatment and for 4 months after the last dose. Women should inform their doctor if they wish to become pregnant or think they might be pregnant. There is a potential for serious side effects to an unborn child. Talk to your health care professional or pharmacist for more information. Do not breast-feed an infant while taking this medicine or for 1 month after the last dose. Avoid taking products that contain aspirin, acetaminophen, ibuprofen, naproxen, or ketoprofen unless instructed by your doctor. These medicines may hide a fever. Be careful brushing and flossing your teeth or using a toothpick because you may get an infection or bleed more easily. If you have any dental work done, tell your dentist you are receiving this medicine. Call your doctor or health care professional for advice if you get a fever, chills or sore  throat, or other symptoms of a cold or flu. Do not treat yourself. This drug decreases your body's ability to fight infections. Try to avoid being around people who are sick. This medicine may increase your risk to bruise or bleed. Call your doctor or health care professional if you notice any unusual bleeding. This drug may make you feel generally unwell. This is not uncommon, as chemotherapy can affect healthy cells as well as cancer cells. Report any side effects. Continue your course of treatment even though you feel ill unless your doctor tells you to stop. What side effects may I notice from  receiving this medicine? Side effects that you should report to your doctor or health care professional as soon as possible:  allergic reactions like skin rash, itching or hives, swelling of the face, lips, or tongue  breathing problems  cough  diarrhea  low blood counts - this medicine may decrease the number of white blood cells, red blood cells and platelets. You may be at increased risk for infections and bleeding  nausea, vomiting  signs and symptoms of bleeding such as bloody or black, tarry stools; red or dark-brown urine; spitting up blood or brown material that looks like coffee grounds; red spots on the skin; unusual bruising or bleeding from the eye, gums, or nose  signs of decreased red blood cells - unusually weak or tired, feeling faint or lightheaded, falls  signs and symptoms of infection like fever or chills; cough; sore throat; pain or trouble passing urine  signs and symptoms of liver injury like dark yellow or brown urine; general ill feeling or flu-like symptoms; light-colored stools; loss of appetite; nausea; right upper belly pain; unusually weak or tired; yellowing of the eyes or skin  stomach pain Side effects that usually do not require medical attention (report to your doctor or health care professional if they continue or are bothersome):  hair loss  loss of appetite  mouth sores  upset stomach This list may not describe all possible side effects. Call your doctor for medical advice about side effects. You may report side effects to FDA at 1-800-FDA-1088. Where should I keep my medicine? This drug is given in a hospital or clinic and will not be stored at home. NOTE: This sheet is a summary. It may not cover all possible information. If you have questions about this medicine, talk to your doctor, pharmacist, or health care provider.  2021 Elsevier/Gold Standard (2015-02-04 10:14:31)   Leucovorin injection What is this medicine? LEUCOVORIN (loo  koe VOR in) is used to prevent or treat the harmful effects of some medicines. This medicine is used to treat anemia caused by a low amount of folic acid in the body. It is also used with 5-fluorouracil (5-FU) to treat colon cancer. This medicine may be used for other purposes; ask your health care provider or pharmacist if you have questions. What should I tell my health care provider before I take this medicine? They need to know if you have any of these conditions:  anemia from low levels of vitamin B-12 in the blood  an unusual or allergic reaction to leucovorin, folic acid, other medicines, foods, dyes, or preservatives  pregnant or trying to get pregnant  breast-feeding How should I use this medicine? This medicine is for injection into a muscle or into a vein. It is given by a health care professional in a hospital or clinic setting. Talk to your pediatrician regarding the use of this medicine in children.  Special care may be needed. Overdosage: If you think you have taken too much of this medicine contact a poison control center or emergency room at once. NOTE: This medicine is only for you. Do not share this medicine with others. What if I miss a dose? This does not apply. What may interact with this medicine?  capecitabine  fluorouracil  phenobarbital  phenytoin  primidone  trimethoprim-sulfamethoxazole This list may not describe all possible interactions. Give your health care provider a list of all the medicines, herbs, non-prescription drugs, or dietary supplements you use. Also tell them if you smoke, drink alcohol, or use illegal drugs. Some items may interact with your medicine. What should I watch for while using this medicine? Your condition will be monitored carefully while you are receiving this medicine. This medicine may increase the side effects of 5-fluorouracil, 5-FU. Tell your doctor or health care professional if you have diarrhea or mouth sores that do  not get better or that get worse. What side effects may I notice from receiving this medicine? Side effects that you should report to your doctor or health care professional as soon as possible:  allergic reactions like skin rash, itching or hives, swelling of the face, lips, or tongue  breathing problems  fever, infection  mouth sores  unusual bleeding or bruising  unusually weak or tired Side effects that usually do not require medical attention (report to your doctor or health care professional if they continue or are bothersome):  constipation or diarrhea  loss of appetite  nausea, vomiting This list may not describe all possible side effects. Call your doctor for medical advice about side effects. You may report side effects to FDA at 1-800-FDA-1088. Where should I keep my medicine? This drug is given in a hospital or clinic and will not be stored at home. NOTE: This sheet is a summary. It may not cover all possible information. If you have questions about this medicine, talk to your doctor, pharmacist, or health care provider.  2021 Elsevier/Gold Standard (2007-07-09 16:50:29)  Fluorouracil, 5-FU injection What is this medicine? FLUOROURACIL, 5-FU (flure oh YOOR a sil) is a chemotherapy drug. It slows the growth of cancer cells. This medicine is used to treat many types of cancer like breast cancer, colon or rectal cancer, pancreatic cancer, and stomach cancer. This medicine may be used for other purposes; ask your health care provider or pharmacist if you have questions. COMMON BRAND NAME(S): Adrucil What should I tell my health care provider before I take this medicine? They need to know if you have any of these conditions:  blood disorders  dihydropyrimidine dehydrogenase (DPD) deficiency  infection (especially a virus infection such as chickenpox, cold sores, or herpes)  kidney disease  liver disease  malnourished, poor nutrition  recent or ongoing radiation  therapy  an unusual or allergic reaction to fluorouracil, other chemotherapy, other medicines, foods, dyes, or preservatives  pregnant or trying to get pregnant  breast-feeding How should I use this medicine? This drug is given as an infusion or injection into a vein. It is administered in a hospital or clinic by a specially trained health care professional. Talk to your pediatrician regarding the use of this medicine in children. Special care may be needed. Overdosage: If you think you have taken too much of this medicine contact a poison control center or emergency room at once. NOTE: This medicine is only for you. Do not share this medicine with others. What if I miss a dose?  It is important not to miss your dose. Call your doctor or health care professional if you are unable to keep an appointment. What may interact with this medicine? Do not take this medicine with any of the following medications:  live virus vaccines This medicine may also interact with the following medications:  medicines that treat or prevent blood clots like warfarin, enoxaparin, and dalteparin This list may not describe all possible interactions. Give your health care provider a list of all the medicines, herbs, non-prescription drugs, or dietary supplements you use. Also tell them if you smoke, drink alcohol, or use illegal drugs. Some items may interact with your medicine. What should I watch for while using this medicine? Visit your doctor for checks on your progress. This drug may make you feel generally unwell. This is not uncommon, as chemotherapy can affect healthy cells as well as cancer cells. Report any side effects. Continue your course of treatment even though you feel ill unless your doctor tells you to stop. In some cases, you may be given additional medicines to help with side effects. Follow all directions for their use. Call your doctor or health care professional for advice if you get a fever,  chills or sore throat, or other symptoms of a cold or flu. Do not treat yourself. This drug decreases your body's ability to fight infections. Try to avoid being around people who are sick. This medicine may increase your risk to bruise or bleed. Call your doctor or health care professional if you notice any unusual bleeding. Be careful brushing and flossing your teeth or using a toothpick because you may get an infection or bleed more easily. If you have any dental work done, tell your dentist you are receiving this medicine. Avoid taking products that contain aspirin, acetaminophen, ibuprofen, naproxen, or ketoprofen unless instructed by your doctor. These medicines may hide a fever. Do not become pregnant while taking this medicine. Women should inform their doctor if they wish to become pregnant or think they might be pregnant. There is a potential for serious side effects to an unborn child. Talk to your health care professional or pharmacist for more information. Do not breast-feed an infant while taking this medicine. Men should inform their doctor if they wish to father a child. This medicine may lower sperm counts. Do not treat diarrhea with over the counter products. Contact your doctor if you have diarrhea that lasts more than 2 days or if it is severe and watery. This medicine can make you more sensitive to the sun. Keep out of the sun. If you cannot avoid being in the sun, wear protective clothing and use sunscreen. Do not use sun lamps or tanning beds/booths. What side effects may I notice from receiving this medicine? Side effects that you should report to your doctor or health care professional as soon as possible:  allergic reactions like skin rash, itching or hives, swelling of the face, lips, or tongue  low blood counts - this medicine may decrease the number of white blood cells, red blood cells and platelets. You may be at increased risk for infections and bleeding.  signs of  infection - fever or chills, cough, sore throat, pain or difficulty passing urine  signs of decreased platelets or bleeding - bruising, pinpoint red spots on the skin, black, tarry stools, blood in the urine  signs of decreased red blood cells - unusually weak or tired, fainting spells, lightheadedness  breathing problems  changes in vision  chest  pain  mouth sores  nausea and vomiting  pain, swelling, redness at site where injected  pain, tingling, numbness in the hands or feet  redness, swelling, or sores on hands or feet  stomach pain  unusual bleeding Side effects that usually do not require medical attention (report to your doctor or health care professional if they continue or are bothersome):  changes in finger or toe nails  diarrhea  dry or itchy skin  hair loss  headache  loss of appetite  sensitivity of eyes to the light  stomach upset  unusually teary eyes This list may not describe all possible side effects. Call your doctor for medical advice about side effects. You may report side effects to FDA at 1-800-FDA-1088. Where should I keep my medicine? This drug is given in a hospital or clinic and will not be stored at home. NOTE: This sheet is a summary. It may not cover all possible information. If you have questions about this medicine, talk to your doctor, pharmacist, or health care provider.  2021 Elsevier/Gold Standard (2018-12-03 15:00:03)

## 2020-06-03 ENCOUNTER — Telehealth: Payer: Self-pay | Admitting: *Deleted

## 2020-06-04 ENCOUNTER — Other Ambulatory Visit: Payer: Self-pay

## 2020-06-04 ENCOUNTER — Inpatient Hospital Stay: Payer: Medicare Other

## 2020-06-04 VITALS — BP 115/70 | HR 81 | Temp 99.1°F | Resp 18

## 2020-06-04 DIAGNOSIS — C252 Malignant neoplasm of tail of pancreas: Secondary | ICD-10-CM | POA: Diagnosis not present

## 2020-06-04 DIAGNOSIS — Z452 Encounter for adjustment and management of vascular access device: Secondary | ICD-10-CM | POA: Diagnosis not present

## 2020-06-04 DIAGNOSIS — C787 Secondary malignant neoplasm of liver and intrahepatic bile duct: Secondary | ICD-10-CM | POA: Diagnosis not present

## 2020-06-04 DIAGNOSIS — Z5111 Encounter for antineoplastic chemotherapy: Secondary | ICD-10-CM | POA: Diagnosis not present

## 2020-06-04 DIAGNOSIS — Z7189 Other specified counseling: Secondary | ICD-10-CM

## 2020-06-04 MED ORDER — HEPARIN SOD (PORK) LOCK FLUSH 100 UNIT/ML IV SOLN
500.0000 [IU] | Freq: Once | INTRAVENOUS | Status: AC | PRN
  Administered 2020-06-04: 500 [IU]
  Filled 2020-06-04: qty 5

## 2020-06-04 MED ORDER — SODIUM CHLORIDE 0.9% FLUSH
10.0000 mL | INTRAVENOUS | Status: DC | PRN
  Administered 2020-06-04: 10 mL
  Filled 2020-06-04: qty 10

## 2020-06-04 NOTE — Patient Instructions (Signed)
Oral Mucositis Oral mucositis is a mouth condition that may develop as a result of treatments for cancer. Sores may appear on the lips, gums, tongue, throat, and the top (roof) or bottom (floor) of the mouth. What are the causes? This condition is caused when cancer treatments damage the lining of the mouth. This condition can happen to anyone who is being treated with cancer therapies, including:  Cancer medicines (chemotherapy) or targeted therapy.  Radiation therapy.  Bone marrow transplants and stem cell transplants. Oral mucositis is not caused by infection. However, the sores can become infected after they form. Infection can make oral mucositis worse. What increases the risk? The following factors may make you more likely to develop this condition:  Having cancers that primarily affect the blood, head, or neck.  Receiving radiation therapy alone, or in combination with chemotherapy, to the head and neck region.  Undergoing high dose chemotherapy alone or as part of bone marrow or stem cell transplant. In addition, there are other risks, including:  Having poor oral hygiene, dental problems or oral diseases.  Wearing dentures that do not fit correctly.  Having other medical conditions, such as diabetes, HIV, AIDS, or kidney disease.  Using products that contain nicotine or tobacco, such as cigarettes, chewing tobacco, and e-cigarettes.  Not drinking enough clear fluids.  Drinking alcohol. What are the signs or symptoms? Symptoms of this condition include:  Mouth sores. These sores may bleed.  Color changes inside the mouth. Red, shiny areas may appear.  White patches or pus in the mouth.  Pain in the mouth and throat. This can make it painful to speak and swallow.  Dryness and a burning feeling in the mouth.  Saliva that is thick.  Trouble eating, drinking, and swallowing. This can lead to weight loss. Symptoms of this condition can vary from mild to severe.  Symptoms are usually seen 7-10 days after cancer treatment has started. How is this diagnosed? This condition can be diagnosed with a physical exam. In some cases, lab tests or cultures may be done to check for an associated infection. How is this treated? Treatment depends on the severity of the condition. Oral mucositis often heals on its own. Sometimes, changes in the cancer treatment can help. Treatment may include medicines or therapies, such as:  An antibiotic medicine to fight infection.  Medicine or therapies that help the cells in your mouth heal more quickly.  Chewing on ice before treatment to help prevent mucositis. Medicine may also be given to help control pain. This may include:  Pain relievers that are swished around in the mouth (topical anesthetics). These make the mouth numb to ease the pain.  Mouth rinses.  Prescribed, medicated gels. The gel coats the mouth. This protects nerve endings and lessens the pain.  Pain medicines. Follow these instructions at home: Medicines  Take or apply over-the-counter and prescription medicines only as told by your health care provider.  If you were prescribed an antibiotic medicine, take or apply it as told by your health care provider. Do not stop using the antibiotic even if you start to feel better.  Do not use products that contain benzocaine, including numbing gels, to treat mouth pain in children who are younger than 2 years. These products may cause a rare but serious blood condition. Eating and drinking  Talk to a diet and nutrition specialist (dietitian) about what you should eat and drink if you have mucositis.  Drink high-nutrition and high-calorie shakes or supplements.    Eat bland and soft foods that are easy to eat.  Drink enough fluid to keep your urine pale yellow.  Do not eat foods that are hot, spicy, citrus, or hard to swallow.  Do not drink alcohol.  Try sucking on ice chips or sugar-free frozen pops.  This may help with pain. This also keeps your mouth moist.   Lifestyle  Keep your mouth clean and germ-free. To maintain good oral hygiene: ? Brush your teeth carefully with a soft toothbrush at least two times each day. Use a gentle toothpaste. Ask your health care provider to recommend the right toothpaste for you. ? Use a soft sponge (oral swab) to clean your mouth and teeth instead of a toothbrush if mouth sores are severe. ? Floss your teeth every day. ? Have your teeth cleaned regularly as recommended by your dentist. ? Rinse your mouth after every meal or as directed by your health care provider. Do not use mouthwash that contains alcohol. Ask your health care provider for a mouthwash or mouth rinse recommendation.  Do not use any products that contain nicotine or tobacco, such as cigarettes, e-cigarettes, and chewing tobacco. If you need help quitting, ask your health care provider.      General instructions  Follow instructions from your health care provider about: ? Cleaning mouth sores. ? Taking out your dentures. ? Changing your diet or finding other ways to get nutrients. This is important if you are losing weight.  If your lips are dry or cracked, apply a water-based moisturizer to your lips as needed.  Keep all follow-up visits as told by your health care provider. This is important. Contact a health care provider if:  You have mouth pain or throat pain.  Your symptoms get worse.  You have new symptoms.  Your pain is not controlled with medicine.  You have trouble speaking. Get help right away if you:  Have a fever.  Cannot swallow solid food or liquids.  Have a lot of bleeding in your mouth.  Develop new, open, or draining sores in your mouth. Summary  Oral mucositis is a mouth condition that may develop as a result of treatments for cancer.  Sores may appear on your lips, gums, tongue, throat, and the top (roof) or bottom (floor) of your  mouth.  Cancer treatments can damage the lining of the mouth, which causes this condition.  Treatment depends on how severe the condition is. It may include medicine or therapies to fight infection, medicine to ease pain, or medicine to help the cells in your mouth heal more quickly. This information is not intended to replace advice given to you by your health care provider. Make sure you discuss any questions you have with your health care provider. Document Revised: 08/12/2018 Document Reviewed: 08/12/2018 Elsevier Patient Education  2021 Elsevier Inc.  

## 2020-06-04 NOTE — Progress Notes (Signed)
Mariah Henderson had a pump malfunction at home and came in for her pump DC.  There was 31 mL left in the bag which equals to about 700 mg.  With assistance from Jettie Booze, RN, we primed in the rest of the 31 mL left per MD instruction.  Henreitta Leber, PharmD was consulted as well to make sure this was appropriate.  Instructed patient to continue very good oral care and to call with any signs of mucositis .Gardiner Rhyme, RN

## 2020-06-07 ENCOUNTER — Other Ambulatory Visit: Payer: Self-pay | Admitting: Hematology

## 2020-06-07 DIAGNOSIS — C252 Malignant neoplasm of tail of pancreas: Secondary | ICD-10-CM

## 2020-06-07 DIAGNOSIS — Z7189 Other specified counseling: Secondary | ICD-10-CM

## 2020-06-16 ENCOUNTER — Inpatient Hospital Stay: Payer: Medicare Other

## 2020-06-16 ENCOUNTER — Other Ambulatory Visit: Payer: Self-pay | Admitting: Hematology

## 2020-06-16 ENCOUNTER — Inpatient Hospital Stay: Payer: Medicare Other | Admitting: Nurse Practitioner

## 2020-06-16 ENCOUNTER — Encounter: Payer: Self-pay | Admitting: Nurse Practitioner

## 2020-06-16 ENCOUNTER — Inpatient Hospital Stay: Payer: Medicare Other | Attending: Nurse Practitioner

## 2020-06-16 ENCOUNTER — Other Ambulatory Visit: Payer: Self-pay

## 2020-06-16 VITALS — Ht 60.0 in

## 2020-06-16 VITALS — BP 145/78 | HR 75 | Temp 97.9°F | Resp 17 | Wt 146.9 lb

## 2020-06-16 DIAGNOSIS — Z5111 Encounter for antineoplastic chemotherapy: Secondary | ICD-10-CM | POA: Diagnosis not present

## 2020-06-16 DIAGNOSIS — Z79899 Other long term (current) drug therapy: Secondary | ICD-10-CM | POA: Diagnosis not present

## 2020-06-16 DIAGNOSIS — C787 Secondary malignant neoplasm of liver and intrahepatic bile duct: Secondary | ICD-10-CM | POA: Diagnosis not present

## 2020-06-16 DIAGNOSIS — Z9049 Acquired absence of other specified parts of digestive tract: Secondary | ICD-10-CM | POA: Diagnosis not present

## 2020-06-16 DIAGNOSIS — I10 Essential (primary) hypertension: Secondary | ICD-10-CM | POA: Diagnosis not present

## 2020-06-16 DIAGNOSIS — D3502 Benign neoplasm of left adrenal gland: Secondary | ICD-10-CM | POA: Diagnosis not present

## 2020-06-16 DIAGNOSIS — K5730 Diverticulosis of large intestine without perforation or abscess without bleeding: Secondary | ICD-10-CM | POA: Diagnosis not present

## 2020-06-16 DIAGNOSIS — C252 Malignant neoplasm of tail of pancreas: Secondary | ICD-10-CM | POA: Diagnosis not present

## 2020-06-16 DIAGNOSIS — M1990 Unspecified osteoarthritis, unspecified site: Secondary | ICD-10-CM | POA: Insufficient documentation

## 2020-06-16 DIAGNOSIS — Z95828 Presence of other vascular implants and grafts: Secondary | ICD-10-CM

## 2020-06-16 DIAGNOSIS — I700 Atherosclerosis of aorta: Secondary | ICD-10-CM | POA: Diagnosis not present

## 2020-06-16 DIAGNOSIS — E876 Hypokalemia: Secondary | ICD-10-CM | POA: Insufficient documentation

## 2020-06-16 DIAGNOSIS — Z7189 Other specified counseling: Secondary | ICD-10-CM

## 2020-06-16 LAB — CBC WITH DIFFERENTIAL (CANCER CENTER ONLY)
Abs Immature Granulocytes: 0.01 10*3/uL (ref 0.00–0.07)
Basophils Absolute: 0.1 10*3/uL (ref 0.0–0.1)
Basophils Relative: 1 %
Eosinophils Absolute: 0.4 10*3/uL (ref 0.0–0.5)
Eosinophils Relative: 8 %
HCT: 34.9 % — ABNORMAL LOW (ref 36.0–46.0)
Hemoglobin: 11.6 g/dL — ABNORMAL LOW (ref 12.0–15.0)
Immature Granulocytes: 0 %
Lymphocytes Relative: 12 %
Lymphs Abs: 0.6 10*3/uL — ABNORMAL LOW (ref 0.7–4.0)
MCH: 29 pg (ref 26.0–34.0)
MCHC: 33.2 g/dL (ref 30.0–36.0)
MCV: 87.3 fL (ref 80.0–100.0)
Monocytes Absolute: 0.6 10*3/uL (ref 0.1–1.0)
Monocytes Relative: 13 %
Neutro Abs: 3 10*3/uL (ref 1.7–7.7)
Neutrophils Relative %: 66 %
Platelet Count: 166 10*3/uL (ref 150–400)
RBC: 4 MIL/uL (ref 3.87–5.11)
RDW: 17.1 % — ABNORMAL HIGH (ref 11.5–15.5)
WBC Count: 4.6 10*3/uL (ref 4.0–10.5)
nRBC: 0 % (ref 0.0–0.2)

## 2020-06-16 LAB — CMP (CANCER CENTER ONLY)
ALT: 29 U/L (ref 0–44)
AST: 42 U/L — ABNORMAL HIGH (ref 15–41)
Albumin: 2.7 g/dL — ABNORMAL LOW (ref 3.5–5.0)
Alkaline Phosphatase: 238 U/L — ABNORMAL HIGH (ref 38–126)
Anion gap: 10 (ref 5–15)
BUN: 10 mg/dL (ref 8–23)
CO2: 28 mmol/L (ref 22–32)
Calcium: 9.2 mg/dL (ref 8.9–10.3)
Chloride: 104 mmol/L (ref 98–111)
Creatinine: 0.67 mg/dL (ref 0.44–1.00)
GFR, Estimated: >60 mL/min (ref >60)
Glucose, Bld: 132 mg/dL — ABNORMAL HIGH (ref 70–99)
Potassium: 3.5 mmol/L (ref 3.5–5.1)
Sodium: 142 mmol/L (ref 135–145)
Total Bilirubin: 0.8 mg/dL (ref 0.3–1.2)
Total Protein: 6.5 g/dL (ref 6.5–8.1)

## 2020-06-16 MED ORDER — SODIUM CHLORIDE 0.9 % IV SOLN
10.0000 mg | Freq: Once | INTRAVENOUS | Status: AC
  Administered 2020-06-16: 10 mg via INTRAVENOUS
  Filled 2020-06-16: qty 10

## 2020-06-16 MED ORDER — PALONOSETRON HCL INJECTION 0.25 MG/5ML
INTRAVENOUS | Status: AC
  Filled 2020-06-16: qty 5

## 2020-06-16 MED ORDER — SODIUM CHLORIDE 0.9% FLUSH
10.0000 mL | INTRAVENOUS | Status: DC | PRN
Start: 2020-06-16 — End: 2020-06-16
  Administered 2020-06-16: 10 mL
  Filled 2020-06-16: qty 10

## 2020-06-16 MED ORDER — PALONOSETRON HCL INJECTION 0.25 MG/5ML
0.2500 mg | Freq: Once | INTRAVENOUS | Status: AC
  Administered 2020-06-16: 0.25 mg via INTRAVENOUS

## 2020-06-16 MED ORDER — SODIUM CHLORIDE 0.9 % IV SOLN
400.0000 mg/m2 | Freq: Once | INTRAVENOUS | Status: AC
  Administered 2020-06-16: 660 mg via INTRAVENOUS
  Filled 2020-06-16: qty 33

## 2020-06-16 MED ORDER — SODIUM CHLORIDE 0.9 % IV SOLN
52.0000 mg/m2 | Freq: Once | INTRAVENOUS | Status: AC
  Administered 2020-06-16: 86 mg via INTRAVENOUS
  Filled 2020-06-16: qty 20

## 2020-06-16 MED ORDER — FUROSEMIDE 20 MG PO TABS: ORAL_TABLET | ORAL | 0 refills | Status: DC

## 2020-06-16 MED ORDER — HEPARIN SOD (PORK) LOCK FLUSH 100 UNIT/ML IV SOLN
500.0000 [IU] | Freq: Once | INTRAVENOUS | Status: DC | PRN
  Filled 2020-06-16: qty 5

## 2020-06-16 MED ORDER — SODIUM CHLORIDE 0.9% FLUSH
10.0000 mL | Freq: Once | INTRAVENOUS | Status: AC
  Administered 2020-06-16: 10 mL
  Filled 2020-06-16: qty 10

## 2020-06-16 MED ORDER — SODIUM CHLORIDE 0.9 % IV SOLN
Freq: Once | INTRAVENOUS | Status: AC
  Filled 2020-06-16: qty 250

## 2020-06-16 MED ORDER — SODIUM CHLORIDE 0.9 % IV SOLN
2400.0000 mg/m2 | INTRAVENOUS | Status: DC
  Administered 2020-06-16: 3950 mg via INTRAVENOUS
  Filled 2020-06-16: qty 79

## 2020-06-16 NOTE — Progress Notes (Signed)
[place]   [phone] [phone]   Clinic Follow up Note   Patient Care Team: Mariah Falcon, MD as PCP - General (Internal Medicine) Mariah Henderson, [place] (Optometry) Mariah Finner, RN as Oncology Nurse Navigator Mariah Merle, MD as Consulting Physician (Oncology) Mariah Kaufman, RN as Registered Nurse 06/16/2020  CHIEF COMPLAINT: Follow up pancreas cancer   SUMMARY OF ONCOLOGIC HISTORY: Oncology History Overview Note  Cancer Staging Pancreatic cancer [facility]) Staging form: Exocrine Pancreas, AJCC 8th Edition - Clinical: Stage IV (cT1c, cN0, pM1) - Signed by Mariah Merle, MD on 02/26/2020 Total positive nodes: 0    Pancreatic cancer ([place])  01/13/2020 Imaging   US Abdomen 01/13/20  IMPRESSION: 1. Suggestion of 3 vascular hypoechoic mass within the liver measuring up to 2.5 cm. 2. Suggestion of a hypoechoic mass within the tail of the pancreas that is not well visualized. 3. Findings concerning for malignancy, please see separately dictated CT abdomen pelvis 01/13/2020.   01/13/2020 Imaging   CT AP 01/13/20  IMPRESSION: 1. Hypoenhancing 2.0 cm in long axis mass in the pancreatic tail compatible with pancreatic adenocarcinoma. The mass abuts the posterior gastric wall but without definite gastric invasion. The splenic vein appears narrowed/attenuated compared to prior exams and the lesion abuts the margin of the splenic artery. 2. Hypoenhancing hepatic masses are new compared to 11/22/2017 and are highly suspicious for metastatic lesions. 3. Questionable 0.8 by 0.8 cm omental tumor nodule versus dense diverticulum above the transverse colon. 4. Other imaging findings of potential clinical significance: Mild distal esophageal wall thickening, query esophagitis. Stable left adrenal adenoma. Sigmoid colon diverticulosis. Lumbar and thoracic spondylosis and degenerative disc disease contributing to multilevel impingement. 5. Aortic  atherosclerosis. Aortic Atherosclerosis (ICD10-I70.0).   01/21/2020 Initial Diagnosis   Pancreatic cancer ([place])   01/28/2020 Initial Biopsy   FINAL MICROSCOPIC DIAGNOSIS:   A. LIVER, NEEDLE CORE BIOPSY:  - Adenocarcinoma.   COMMENT:   Immunohistochemistry for CK7 is positive.  CDX-2 demonstrates weak to  moderate positive staining.  TTF-1 and PAX 8 demonstrate weak, likely  nonspecific staining.  CK20, GATA-3 and ER are negative.  The  morphologic and immunophenotypic characteristics are compatible with the  clinical impression of a primary pancreatic cancer.  Radiologic  correlation is encouraged.  If applicable, there is likely sufficient  tissue for ancillary studies (Block A2).  Dr. Vic Ripper reviewed the  case.    02/26/2020 - 05/19/2020 Chemotherapy   First-line Gemcitabine and Abraxane every 2 weeks starting 02/26/20. D/c after 05/19/20 due to disease progression in liver and neuropathy     02/26/2020 Cancer Staging   Staging form: Exocrine Pancreas, AJCC 8th Edition - Clinical: Stage IV (cT1c, cN0, pM1) - Signed by Mariah Merle, MD on 02/26/2020 Total positive nodes: 0   03/08/2020 Genetic Testing   Negative hereditary cancer genetic testing: no pathogenic variants detected in [facility] Common Hereditary Cancers Panel with preliminary evidence pancreatic cancer genes.  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 The report date is March 08, 2020.    The Common Hereditary Cancers Panel with preliminary evidence pancreatic cancer genes offered by [facility] includes sequencing and/or deletion duplication testing of the following 49 genes: APC, ATM, AXIN2, BARD1, BMPR1A, BRCA1, BRCA2, BRIP1, CDH1, CDK4, CDKN2A (p14ARF), CDKN2A (p16INK4a), CHEK2, CTNNA1, DICER1, EPCAM (Deletion/duplication testing only), GREM1 (promoter region deletion/duplication testing only), FANCC, GREM1, HOXB13, KIT, MEN1, MLH1, MSH2, MSH3, MSH6, MUTYH, NBN, NF1, NHTL1, PALB2, PALLD,  PDGFRA, PMS2, POLD1, POLE, PTEN, RAD50,  RAD51C, RAD51D, SDHA, SDHB, SDHC, SDHD, SMAD4, SMARCA4. STK11, TP53, TSC1, TSC2, and VHL.  The following genes were evaluated for sequence changes only: SDHA and HOXB13 c.251G>A variant only.es only: SDHA and HOXB13 c.251G>A variant only.   05/03/2020 Imaging   CT AP  IMPRESSION: 1. Interval decrease in size of a hypodense mass of the central pancreatic tail, consistent with treatment response of primary pancreatic malignancy. 2. Numerous low-attenuation liver lesions are seen, increased in size and number compared to prior examination. Findings are consistent with worsened hepatic metastatic disease. 3. A previously queried omental nodule adjacent to the transverse colon is more clearly a prominent diverticulum on current examination.   Aortic Atherosclerosis (ICD10-I70.0).   06/02/2020 -  Chemotherapy   Second-line FOLFIRI q2weeks starting 06/02/20      CURRENT THERAPY: Second line FOLFIRI with liposomal irinotecan q2 weeks starting 06/02/20, dose reduced for first cycle  INTERVAL HISTORY: Mariah Henderson returns for follow up and treatment as scheduled. She began second line FOLFIRI 06/02/20.  She did pretty good.  Energy and appetite are adequate, she continues regular activities.  She is still active, legs remain swollen.  When she was on Lasix for 5 days this did improve.  BMs depending on her diet, up to 3/day but not loose.  Denies blood in stools.  Denies nausea/vomiting, abdominal pain, mucositis, worsening neuropathy, fever, chills, cough, chest pain, dyspnea, or new concerns.   MEDICAL HISTORY:  Past Medical History:  Diagnosis Date  . Acute medial meniscus tear    right knee  . Anxiety   . Asthma   . Cancer ([place])   . Cervical spondylosis   . Chronic serous otitis media   . Constipation   . COPD (chronic obstructive pulmonary disease) ([place])   . Depression    followed by [name]; no longer of depakote, seroquel  . Diabetes  mellitus without complication ([place])   . Dysuria 12/27/2009   Qualifier: Diagnosis of  By: [name] MD, Amanjot    . Foot drop   . GERD (gastroesophageal reflux disease)   . Heart murmur   . Hyperlipidemia   . Hypertension   . Low back pain   . Obesity   . Paraumbilical hernia   . Pre-diabetes     SURGICAL HISTORY: Past Surgical History:  Procedure Laterality Date  . ABDOMINAL HYSTERECTOMY    . ANTERIOR CERVICAL DECOMP/DISCECTOMY FUSION N/A 10/05/2014   Procedure: ACDF - C5-C6 - C6-C7;  Surgeon: Karie Chimera, MD;  Location: [facility];  Service: Neurosurgery;  Laterality: N/A;  ACDF - C5-C6 - C6-C7  . CHOLECYSTECTOMY    . COLONOSCOPY    . FOOT SURGERY    . HERNIA REPAIR  1999  . IR IMAGING GUIDED PORT INSERTION  02/25/2020  . IR US GUIDANCE  01/28/2020  . KNEE ARTHROSCOPY WITH MEDIAL MENISECTOMY Right 06/14/2016   Procedure: RIGHT KNEE ARTHROSCOPY WITH PARTIAL MEDIAL MENISCECTOMY, SUBCHONDROPLASTY;  Surgeon: Leandrew Koyanagi, MD;  Location: [facility];  Service: Orthopedics;  Laterality: Right;  . KNEE ARTHROSCOPY WITH SUBCHONDROPLASTY Right 06/14/2016   Procedure: KNEE ARTHROSCOPY WITH SUBCHONDROPLASTY;  Surgeon: Leandrew Koyanagi, MD;  Location: [place] [facility];  Service: Orthopedics;  Laterality: Right;  . RADIOACTIVE SEED GUIDED EXCISIONAL BREAST BIOPSY Left 06/23/2014   Procedure: RADIOACTIVE SEED GUIDED EXCISIONAL BREAST BIOPSY;  Surgeon: Stark Klein, MD;  Location: [place];  Service: General;  Laterality: Left;    I have reviewed the social history and family history with the  patient and they are unchanged from previous note.  ALLERGIES:  has No Known Allergies.  MEDICATIONS:  Current Outpatient Medications  Medication Sig Dispense Refill  . albuterol (PROVENTIL) (2.5 MG/3ML) 0.083% nebulizer solution Take 3 mLs (2.5 mg total) by nebulization every 6 (six) hours as needed for wheezing. 1080 mL 3  . albuterol (VENTOLIN HFA) 108 (90 Base)  MCG/ACT inhaler INHALE 1 TO 2 PUFFS INTO THE LUNGS EVERY 6 HOURS AS NEEDED FOR WHEEZING OR SHORTNESS OF BREATH 54 g 1  . alendronate (FOSAMAX) 70 MG tablet TAKE 1 TABLET BY MOUTH EVERY 7 DAYS. TAKE WITH A FULL GLASS OF WATER ON AN EMPTY STOMACH. 12 tablet 3  . atorvastatin (LIPITOR) 40 MG tablet TAKE 1 TABLET BY MOUTH EVERY DAY 90 tablet 3  . benazepril (LOTENSIN) 20 MG tablet TAKE 1 TABLET(20 MG) BY MOUTH DAILY 90 tablet 3  . calcium carbonate (OS-CAL) 600 MG TABS tablet Take by mouth.    . celecoxib (CELEBREX) 100 MG capsule TAKE 1 CAPSULE BY MOUTH  TWICE DAILY 180 capsule 3  . cholecalciferol (VITAMIN D) 1000 units tablet Take 2,000 Units by mouth daily.    . diclofenac Sodium (VOLTAREN) 1 % GEL Apply 2 g topically 4 (four) times daily. 100 g 2  . famotidine (PEPCID) 40 MG tablet TAKE 1/2 TABLET(20 MG) BY MOUTH TWICE DAILY 30 tablet 3  . fluticasone (FLONASE) 50 MCG/ACT nasal spray Place 1 spray into both nostrils daily. 16 g 3  . Fluticasone-Salmeterol (ADVAIR DISKUS) 100-50 MCG/DOSE AEPB Inhale 1 puff into the lungs 2 (two) times daily. 60 each 11  . glipiZIDE (GLUCOTROL XL) 2.5 MG 24 hr tablet TAKE 1 TABLET(2.5 MG) BY MOUTH DAILY WITH BREAKFAST 30 tablet 2  . glucose blood (ONETOUCH VERIO) test strip Use as instructed 100 each 12  . hydrochlorothiazide (MICROZIDE) 12.5 MG capsule TAKE 1 CAPSULE(12.5 MG) BY MOUTH DAILY 90 capsule 1  . HYDROcodone-acetaminophen (NORCO/VICODIN) 5-325 MG tablet Take 1 tablet by mouth 2 (two) times daily as needed for severe pain. 60 tablet 0  . ipratropium (ATROVENT) 0.02 % nebulizer solution Take 0.5 mg by nebulization every 6 (six) hours as needed for wheezing or shortness of breath.    Marland Kitchen KLOR-CON 20 MEQ packet DISSOLVE 1 PACKET INTO LIQUID AND DRINK TWICE DAILY 180 each 0  . Lancets (ONETOUCH ULTRASOFT) lancets Use as instructed 100 each 12  . latanoprost (XALATAN) 0.005 % ophthalmic solution PLACE 1 DROP INTO BOTH EYES AT BEDTIME  0  . loperamide (LOPERAMIDE  A-D) 2 MG tablet Take 1 tablet (2 mg total) by mouth 2 (two) times daily as needed for diarrhea or loose stools. 6 tablet 0  . loratadine (CLARITIN) 10 MG tablet Take 1 tablet (10 mg total) by mouth daily. 30 tablet 11  . magic mouthwash w/lidocaine SOLN Take 5 mLs by mouth 4 (four) times daily as needed for mouth pain. 480 mL 2  . meclizine (ANTIVERT) 12.5 MG tablet TAKE 1 TABLET(12.5 MG) BY MOUTH TWICE DAILY AS NEEDED FOR DIZZINESS 15 tablet 3  . prochlorperazine (COMPAZINE) 10 MG tablet TAKE 1 TABLET(10 MG) BY MOUTH EVERY 6 HOURS AS NEEDED FOR NAUSEA OR VOMITING 30 tablet 1  . risperiDONE (RISPERDAL) 0.25 MG tablet Take 0.25 mg by mouth daily as needed.    . sertraline (ZOLOFT) 50 MG tablet Take 50 mg by mouth daily.    . traZODone (DESYREL) 50 MG tablet Take 25-50 mg by mouth at bedtime as needed for sleep.     Marland Kitchen  triamcinolone cream (KENALOG) 0.1 % apply to affected area twice a day 454 g 1  . furosemide (LASIX) 20 MG tablet Take 1 tablet by mouth once daily on day of chemo and while 5FU infusing, then take 1/2 tab (10 mg) by mouth daily 30 tablet 0   No current facility-administered medications for this visit.   Facility-Administered Medications Ordered in Other Visits  Medication Dose Route Frequency Provider Last Rate Last Admin  . heparin lock flush 100 unit/mL  500 Units Intracatheter Once PRN Mariah Merle, MD      . sodium chloride flush (NS) 0.9 % injection 10 mL  10 mL Intracatheter PRN Mariah Merle, MD        PHYSICAL EXAMINATION: ECOG PERFORMANCE STATUS: 1 - Symptomatic but completely ambulatory  Vitals:   06/16/20 1213  BP: (!) 145/78  Pulse: 75  Resp: 17  Temp: 97.9 F (36.6 C)  SpO2: 100%   Filed Weights   06/16/20 1213  Weight: 146 lb 14.4 oz (66.6 kg)    GENERAL:alert, no distress and comfortable SKIN: No rash.  Darkening to bilateral thumb nailbeds EYES: sclera clear OROPHARYNX: No thrush or ulcers NECK: Without mass or swelling LUNGS: normal breathing  effort HEART: Symmetric lower extremity edema NEURO: alert & oriented x 3 with fluent speech, no focal motor deficits PAC without erythema  LABORATORY DATA:  I have reviewed the data as listed CBC Latest Ref Rng & Units 06/16/2020 06/02/2020 05/19/2020  WBC 4.0 - 10.5 K/uL 4.6 3.8(L) 7.2  Hemoglobin 12.0 - 15.0 g/dL 11.6(L) 11.4(L) 13.3  Hematocrit 36.0 - 46.0 % 34.9(L) 35.3(L) 41.7  Platelets 150 - 400 K/uL 166 197 157     CMP Latest Ref Rng & Units 06/16/2020 06/02/2020 05/19/2020  Glucose 70 - 99 mg/dL 132(H) 205(H) 226(H)  BUN 8 - 23 mg/dL _0 Creatinine 0.44 - 1.00 mg/dL 0.67 0.70 0.76  Sodium 135 - 145 mmol/L 142 141 140  Potassium 3.5 - 5.1 mmol/L 3.5 3.7 3.5  Chloride 98 - 111 mmol/L 104 102 101  CO2 22 - 32 mmol/L _1 Calcium 8.9 - 10.3 mg/dL 9.2 9.4 9.2  Total Protein 6.5 - 8.1 g/dL 6.5 6.5 6.8  Total Bilirubin 0.3 - 1.2 mg/dL 0.8 1.0 1.3(H)  Alkaline Phos 38 - 126 U/L 238(H) 249(H) 345(H)  AST 15 - 41 U/L 42(H) 51(H) 61(H)  ALT 0 - 44 U/L 29 27 36      RADIOGRAPHIC STUDIES: I have personally reviewed the radiological images as listed and agreed with the findings in the report. No results found.   ASSESSMENT & PLAN: Mariah Henderson a 70 y.o.femalewith   1. Pancreaticcancer withLivermetastasis, stage IV -Her CT AP from 01/13/20 showed 2cm mass on tail of pancreas abutting but not invading the gastric wall, with likely liver and omental metastasis. -Herliver biopsy from 01/28/20 showed adenocarcinoma from pancreatic primary. Her 02/11/20 CT Chestshowed no metastasis -She began first-line palliative chemo with gemcitabine andAbraxaneevery 2 weeks beginning 02/26/20, she progressed in the liver and changed to second line liposomal irinotecan/leuc and 5FU beginning 06/02/20. Dose reduced for cycle 1  2. Abdominal Pain, low appetite, 10 lbs weight loss, Transmanitis  -Shehadabdominal pain for the monthprior to cancer diagnosis. She is on Norco 5-310m  BID as needed.her pain has improved since chemo -She had sudden loss of appetite and weight loss since October 2021.  -On Chemo her appetite has increased, gaining weight and eating more. -denies pain (06/16/20)  3. Comorbidities:  Asthma, Borderline Diabetic, GERD, Heart murmur, HLD, HTN, arthritis, neuropathy  -On medications, including multiple diuretics.  -Managed by PCP.  4. Social support -She lives alone in 1 level house and she is retired. She has 1 adult son who lives in [place]. She has a twin and 3 other sisters who live in [place], and other siblings and relative in [place]. -Her son will help her at home -She has approved for grant to help with medication. Her sister will notify us of the type of grant.  5. Cytopenias  -secondary to chemotherapy   6.Goal of care discussion  -The patient understands the goal of care is palliative. -currently full code   7. Hypokalemia secondary to diuretic -She is on Lasix and dieretics for her LE edema and heart. -continue oral K TID -she has worsening leg edema. Doppler 05/19/20 negative for DVT -new Rx 06/16/20: lasix 20 mg po once daily on days 1-3 of chemo, then 10 mg daily   8. Genetic testing from 02/19/20 was negative for pathogenetic mutation with VUS of gene NF1  Disposition: Ms. Carns appears stable.  She completed 1 cycle dose reduced liposomal irinotecan/leuc and 5-FU. She tolerated treatment very well overall. She is able to recover and function well.   Legs remain swollen. I recommend to restart lasix, 1 tab daily on days 1-3 of chemo, then 1/2 tab daily thereafter. I recommend compression stockings and leg elevation.   Labs reviewed. CBC and CMP stable. Continue K TID on diuretics. Labs adequate to proceed with cycle 2 lipo FOLFIRI today as planned. Will increase 5FU due to her good tolerance.   F/up in 2 weeks with cycle 3  The plan was reviewed with Dr. Burr Medico who adjusted chemo orders.   All questions were  answered. The patient knows to call the clinic with any problems, questions or concerns. No barriers to learning were detected.     Alla Feeling, NP 06/16/20

## 2020-06-16 NOTE — Progress Notes (Signed)
Cont liposomal irinotecan 50mg /m2 today per Dr Burr Medico

## 2020-06-17 ENCOUNTER — Telehealth: Payer: Self-pay | Admitting: Hematology

## 2020-06-17 NOTE — Telephone Encounter (Signed)
Left message with follow-up appointments per 6/1 los. Gave option to call back to reschedule if needed.

## 2020-06-18 ENCOUNTER — Other Ambulatory Visit: Payer: Self-pay

## 2020-06-18 ENCOUNTER — Inpatient Hospital Stay: Payer: Medicare Other

## 2020-06-18 VITALS — BP 123/76 | HR 77 | Temp 99.3°F | Resp 16

## 2020-06-18 DIAGNOSIS — E876 Hypokalemia: Secondary | ICD-10-CM | POA: Diagnosis not present

## 2020-06-18 DIAGNOSIS — C252 Malignant neoplasm of tail of pancreas: Secondary | ICD-10-CM

## 2020-06-18 DIAGNOSIS — Z5111 Encounter for antineoplastic chemotherapy: Secondary | ICD-10-CM | POA: Diagnosis not present

## 2020-06-18 DIAGNOSIS — M1990 Unspecified osteoarthritis, unspecified site: Secondary | ICD-10-CM | POA: Diagnosis not present

## 2020-06-18 DIAGNOSIS — Z79899 Other long term (current) drug therapy: Secondary | ICD-10-CM | POA: Diagnosis not present

## 2020-06-18 DIAGNOSIS — D3502 Benign neoplasm of left adrenal gland: Secondary | ICD-10-CM | POA: Diagnosis not present

## 2020-06-18 DIAGNOSIS — I10 Essential (primary) hypertension: Secondary | ICD-10-CM | POA: Diagnosis not present

## 2020-06-18 DIAGNOSIS — I700 Atherosclerosis of aorta: Secondary | ICD-10-CM | POA: Diagnosis not present

## 2020-06-18 DIAGNOSIS — Z7189 Other specified counseling: Secondary | ICD-10-CM

## 2020-06-18 DIAGNOSIS — K5730 Diverticulosis of large intestine without perforation or abscess without bleeding: Secondary | ICD-10-CM | POA: Diagnosis not present

## 2020-06-18 DIAGNOSIS — Z9049 Acquired absence of other specified parts of digestive tract: Secondary | ICD-10-CM | POA: Diagnosis not present

## 2020-06-18 DIAGNOSIS — C787 Secondary malignant neoplasm of liver and intrahepatic bile duct: Secondary | ICD-10-CM | POA: Diagnosis not present

## 2020-06-18 MED ORDER — HEPARIN SOD (PORK) LOCK FLUSH 100 UNIT/ML IV SOLN
500.0000 [IU] | Freq: Once | INTRAVENOUS | Status: AC | PRN
  Administered 2020-06-18: 500 [IU]
  Filled 2020-06-18: qty 5

## 2020-06-18 MED ORDER — SODIUM CHLORIDE 0.9% FLUSH
10.0000 mL | INTRAVENOUS | Status: DC | PRN
  Administered 2020-06-18: 10 mL
  Filled 2020-06-18: qty 10

## 2020-06-18 NOTE — Patient Instructions (Signed)

## 2020-06-23 ENCOUNTER — Other Ambulatory Visit: Payer: Self-pay | Admitting: Hematology

## 2020-06-23 DIAGNOSIS — C252 Malignant neoplasm of tail of pancreas: Secondary | ICD-10-CM

## 2020-06-23 DIAGNOSIS — Z7189 Other specified counseling: Secondary | ICD-10-CM

## 2020-06-26 ENCOUNTER — Other Ambulatory Visit: Payer: Self-pay | Admitting: Nurse Practitioner

## 2020-06-30 ENCOUNTER — Inpatient Hospital Stay: Payer: Medicare Other

## 2020-06-30 ENCOUNTER — Other Ambulatory Visit: Payer: Self-pay

## 2020-06-30 ENCOUNTER — Other Ambulatory Visit: Payer: Self-pay | Admitting: Nurse Practitioner

## 2020-06-30 ENCOUNTER — Inpatient Hospital Stay (HOSPITAL_BASED_OUTPATIENT_CLINIC_OR_DEPARTMENT_OTHER): Payer: Medicare Other | Admitting: Nurse Practitioner

## 2020-06-30 ENCOUNTER — Encounter: Payer: Self-pay | Admitting: Nurse Practitioner

## 2020-06-30 VITALS — BP 140/73 | HR 82 | Temp 98.0°F | Resp 18 | Ht 60.0 in | Wt 148.2 lb

## 2020-06-30 DIAGNOSIS — Z79899 Other long term (current) drug therapy: Secondary | ICD-10-CM | POA: Diagnosis not present

## 2020-06-30 DIAGNOSIS — Z9049 Acquired absence of other specified parts of digestive tract: Secondary | ICD-10-CM | POA: Diagnosis not present

## 2020-06-30 DIAGNOSIS — C787 Secondary malignant neoplasm of liver and intrahepatic bile duct: Secondary | ICD-10-CM | POA: Diagnosis not present

## 2020-06-30 DIAGNOSIS — C252 Malignant neoplasm of tail of pancreas: Secondary | ICD-10-CM

## 2020-06-30 DIAGNOSIS — I700 Atherosclerosis of aorta: Secondary | ICD-10-CM | POA: Diagnosis not present

## 2020-06-30 DIAGNOSIS — Z7189 Other specified counseling: Secondary | ICD-10-CM

## 2020-06-30 DIAGNOSIS — I10 Essential (primary) hypertension: Secondary | ICD-10-CM | POA: Diagnosis not present

## 2020-06-30 DIAGNOSIS — D3502 Benign neoplasm of left adrenal gland: Secondary | ICD-10-CM | POA: Diagnosis not present

## 2020-06-30 DIAGNOSIS — E876 Hypokalemia: Secondary | ICD-10-CM | POA: Diagnosis not present

## 2020-06-30 DIAGNOSIS — Z95828 Presence of other vascular implants and grafts: Secondary | ICD-10-CM

## 2020-06-30 DIAGNOSIS — K5730 Diverticulosis of large intestine without perforation or abscess without bleeding: Secondary | ICD-10-CM | POA: Diagnosis not present

## 2020-06-30 DIAGNOSIS — Z5111 Encounter for antineoplastic chemotherapy: Secondary | ICD-10-CM | POA: Diagnosis not present

## 2020-06-30 DIAGNOSIS — M1990 Unspecified osteoarthritis, unspecified site: Secondary | ICD-10-CM | POA: Diagnosis not present

## 2020-06-30 LAB — CBC WITH DIFFERENTIAL (CANCER CENTER ONLY)
Abs Immature Granulocytes: 0 10*3/uL (ref 0.00–0.07)
Basophils Absolute: 0.1 10*3/uL (ref 0.0–0.1)
Basophils Relative: 1 %
Eosinophils Absolute: 0.2 10*3/uL (ref 0.0–0.5)
Eosinophils Relative: 4 %
HCT: 38.1 % (ref 36.0–46.0)
Hemoglobin: 12.2 g/dL (ref 12.0–15.0)
Immature Granulocytes: 0 %
Lymphocytes Relative: 9 %
Lymphs Abs: 0.5 10*3/uL — ABNORMAL LOW (ref 0.7–4.0)
MCH: 28.4 pg (ref 26.0–34.0)
MCHC: 32 g/dL (ref 30.0–36.0)
MCV: 88.8 fL (ref 80.0–100.0)
Monocytes Absolute: 0.8 10*3/uL (ref 0.1–1.0)
Monocytes Relative: 15 %
Neutro Abs: 3.9 10*3/uL (ref 1.7–7.7)
Neutrophils Relative %: 71 %
Platelet Count: 160 10*3/uL (ref 150–400)
RBC: 4.29 MIL/uL (ref 3.87–5.11)
RDW: 17.2 % — ABNORMAL HIGH (ref 11.5–15.5)
WBC Count: 5.4 10*3/uL (ref 4.0–10.5)
nRBC: 0 % (ref 0.0–0.2)

## 2020-06-30 LAB — CMP (CANCER CENTER ONLY)
ALT: 25 U/L (ref 0–44)
AST: 35 U/L (ref 15–41)
Albumin: 2.7 g/dL — ABNORMAL LOW (ref 3.5–5.0)
Alkaline Phosphatase: 243 U/L — ABNORMAL HIGH (ref 38–126)
Anion gap: 9 (ref 5–15)
BUN: 10 mg/dL (ref 8–23)
CO2: 30 mmol/L (ref 22–32)
Calcium: 9.3 mg/dL (ref 8.9–10.3)
Chloride: 102 mmol/L (ref 98–111)
Creatinine: 0.73 mg/dL (ref 0.44–1.00)
GFR, Estimated: >60 mL/min (ref >60)
Glucose, Bld: 190 mg/dL — ABNORMAL HIGH (ref 70–99)
Potassium: 3.7 mmol/L (ref 3.5–5.1)
Sodium: 141 mmol/L (ref 135–145)
Total Bilirubin: 0.9 mg/dL (ref 0.3–1.2)
Total Protein: 6.5 g/dL (ref 6.5–8.1)

## 2020-06-30 MED ORDER — SODIUM CHLORIDE 0.9 % IV SOLN
Freq: Once | INTRAVENOUS | Status: AC
  Filled 2020-06-30: qty 250

## 2020-06-30 MED ORDER — SODIUM CHLORIDE 0.9 % IV SOLN
10.0000 mg | Freq: Once | INTRAVENOUS | Status: AC
  Administered 2020-06-30: 10 mg via INTRAVENOUS
  Filled 2020-06-30: qty 10

## 2020-06-30 MED ORDER — PALONOSETRON HCL INJECTION 0.25 MG/5ML
0.2500 mg | Freq: Once | INTRAVENOUS | Status: AC
  Administered 2020-06-30: 0.25 mg via INTRAVENOUS

## 2020-06-30 MED ORDER — SODIUM CHLORIDE 0.9% FLUSH
10.0000 mL | Freq: Once | INTRAVENOUS | Status: AC
  Administered 2020-06-30: 10 mL
  Filled 2020-06-30: qty 10

## 2020-06-30 MED ORDER — SODIUM CHLORIDE 0.9 % IV SOLN
70.0000 mg/m2 | Freq: Once | INTRAVENOUS | Status: AC
  Administered 2020-06-30: 116.1 mg via INTRAVENOUS
  Filled 2020-06-30: qty 27

## 2020-06-30 MED ORDER — POTASSIUM CHLORIDE CRYS ER 20 MEQ PO TBCR: 20.0000 meq | EXTENDED_RELEASE_TABLET | Freq: Three times a day (TID) | ORAL | 0 refills | Status: DC

## 2020-06-30 MED ORDER — PALONOSETRON HCL INJECTION 0.25 MG/5ML
INTRAVENOUS | Status: AC
  Filled 2020-06-30: qty 5

## 2020-06-30 MED ORDER — SODIUM CHLORIDE 0.9 % IV SOLN
2400.0000 mg/m2 | INTRAVENOUS | Status: DC
  Administered 2020-06-30: 3950 mg via INTRAVENOUS
  Filled 2020-06-30: qty 79

## 2020-06-30 MED ORDER — SODIUM CHLORIDE 0.9 % IV SOLN
400.0000 mg/m2 | Freq: Once | INTRAVENOUS | Status: AC
  Administered 2020-06-30: 660 mg via INTRAVENOUS
  Filled 2020-06-30: qty 33

## 2020-06-30 NOTE — Progress Notes (Signed)
[place]   Telephone:([phone]) [phone] Fax:(336) [phone]   Clinic Follow up Note   Patient Care Team: Sid Falcon, MD as PCP - General (Internal Medicine) Forrest Moron, [place] (Optometry) Jonnie Finner, RN as Oncology Nurse Navigator Truitt Merle, MD as Consulting Physician (Oncology) Gwyndolyn Kaufman, RN (Inactive) as Registered Nurse 06/30/2020  CHIEF COMPLAINT: Follow up pancreas cancer   SUMMARY OF ONCOLOGIC HISTORY: Oncology History Overview Note  Cancer Staging Pancreatic cancer [facility]) Staging form: Exocrine Pancreas, AJCC 8th Edition - Clinical: Stage IV (cT1c, cN0, pM1) - Signed by Truitt Merle, MD on 02/26/2020 Total positive nodes: 0    Pancreatic cancer ([place])  01/13/2020 Imaging   US Abdomen 01/13/20  IMPRESSION: 1. Suggestion of 3 vascular hypoechoic mass within the liver measuring up to 2.5 cm. 2. Suggestion of a hypoechoic mass within the tail of the pancreas that is not well visualized. 3. Findings concerning for malignancy, please see separately dictated CT abdomen pelvis 01/13/2020.   01/13/2020 Imaging   CT AP 01/13/20  IMPRESSION: 1. Hypoenhancing 2.0 cm in long axis mass in the pancreatic tail compatible with pancreatic adenocarcinoma. The mass abuts the posterior gastric wall but without definite gastric invasion. The splenic vein appears narrowed/attenuated compared to prior exams and the lesion abuts the margin of the splenic artery. 2. Hypoenhancing hepatic masses are new compared to 11/22/2017 and are highly suspicious for metastatic lesions. 3. Questionable 0.8 by 0.8 cm omental tumor nodule versus dense diverticulum above the transverse colon. 4. Other imaging findings of potential clinical significance: Mild distal esophageal wall thickening, query esophagitis. Stable left adrenal adenoma. Sigmoid colon diverticulosis. Lumbar and thoracic spondylosis and degenerative disc disease contributing to  multilevel impingement. 5. Aortic atherosclerosis. Aortic Atherosclerosis (ICD10-I70.0).   01/21/2020 Initial Diagnosis   Pancreatic cancer ([place])   01/28/2020 Initial Biopsy   FINAL MICROSCOPIC DIAGNOSIS:   A. LIVER, NEEDLE CORE BIOPSY:  - Adenocarcinoma.   COMMENT:   Immunohistochemistry for CK7 is positive.  CDX-2 demonstrates weak to  moderate positive staining.  TTF-1 and PAX 8 demonstrate weak, likely  nonspecific staining.  CK20, GATA-3 and ER are negative.  The  morphologic and immunophenotypic characteristics are compatible with the  clinical impression of a primary pancreatic cancer.  Radiologic  correlation is encouraged.  If applicable, there is likely sufficient  tissue for ancillary studies (Block A2).  Dr. Vic Ripper reviewed the  case.    02/26/2020 - 05/19/2020 Chemotherapy   First-line Gemcitabine and Abraxane every 2 weeks starting 02/26/20. D/c after 05/19/20 due to disease progression in liver and neuropathy     02/26/2020 Cancer Staging   Staging form: Exocrine Pancreas, AJCC 8th Edition - Clinical: Stage IV (cT1c, cN0, pM1) - Signed by Truitt Merle, MD on 02/26/2020  Total positive nodes: 0    03/08/2020 Genetic Testing   Negative hereditary cancer genetic testing: no pathogenic variants detected in [facility] Common Hereditary Cancers Panel with preliminary evidence pancreatic cancer genes.  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 The report date is March 08, 2020.    The Common Hereditary Cancers Panel with preliminary evidence pancreatic cancer genes offered by [facility] includes sequencing and/or deletion duplication testing of the following 49 genes: APC, ATM, AXIN2, BARD1, BMPR1A, BRCA1, BRCA2, BRIP1, CDH1, CDK4, CDKN2A (p14ARF), CDKN2A (p16INK4a), CHEK2, CTNNA1, DICER1, EPCAM (Deletion/duplication testing only), GREM1 (promoter region deletion/duplication testing only), FANCC, GREM1, HOXB13, KIT, MEN1, MLH1, MSH2, MSH3, MSH6,  MUTYH, NBN, NF1, NHTL1, PALB2, PALLD, PDGFRA, PMS2, POLD1,  POLE, PTEN, RAD50, RAD51C, RAD51D, SDHA, SDHB, SDHC, SDHD, SMAD4, SMARCA4. STK11, TP53, TSC1, TSC2, and VHL.  The following genes were evaluated for sequence changes only: SDHA and HOXB13 c.251G>A variant only.es only: SDHA and HOXB13 c.251G>A variant only.   05/03/2020 Imaging   CT AP  IMPRESSION: 1. Interval decrease in size of a hypodense mass of the central pancreatic tail, consistent with treatment response of primary pancreatic malignancy. 2. Numerous low-attenuation liver lesions are seen, increased in size and number compared to prior examination. Findings are consistent with worsened hepatic metastatic disease. 3. A previously queried omental nodule adjacent to the transverse colon is more clearly a prominent diverticulum on current examination.   Aortic Atherosclerosis (ICD10-I70.0).   06/02/2020 -  Chemotherapy   Second-line FOLFIRI q2weeks starting 06/02/20      CURRENT THERAPY: Second line FOLFIRI with liposomal irinotecan q2 weeks starting 06/02/20, dose reduced for first cycle   INTERVAL HISTORY: Mariah Henderson returns for follow up and treatment as scheduled. She was last seen 06/16/20 and completed cycle 2 liposomal irinotecan and dose increased 5FU.  he pump beeped a couple times but she was able to complete the full dose.  She feels well, appetite has improved.  She is eating and drinking well.  Takes Lasix daily and liquid K 3 times per day.  She is requesting the tablets since the liquid makes her too full.  Bowels moving normally, denies nausea/vomiting, abdominal pain, fever, chills, cough, chest pain, dyspnea.  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   MEDICAL HISTORY:  Past Medical History:  Diagnosis Date   Acute medial meniscus tear    right knee   Anxiety    Asthma    Cancer ([place])    Cervical spondylosis    Chronic serous  otitis media    Constipation    COPD (chronic obstructive pulmonary disease) (HCC)    Depression    followed by [name]; no longer of depakote, seroquel   Diabetes mellitus without complication ([place])    Dysuria 12/27/2009   Qualifier: Diagnosis of  By: [name] MD, Amanjot     Foot drop    GERD (gastroesophageal reflux disease)    Heart murmur    Hyperlipidemia    Hypertension    Low back pain    Obesity    Paraumbilical hernia    Pre-diabetes     SURGICAL HISTORY: Past Surgical History:  Procedure Laterality Date   ABDOMINAL HYSTERECTOMY     ANTERIOR CERVICAL DECOMP/DISCECTOMY FUSION N/A 10/05/2014   Procedure: ACDF - C5-C6 - C6-C7;  Surgeon: Karie Chimera, MD;  Location: [facility];  Service: Neurosurgery;  Laterality: N/A;  ACDF - C5-C6 - C6-C7   CHOLECYSTECTOMY     COLONOSCOPY     FOOT SURGERY     HERNIA REPAIR  1999   IR IMAGING GUIDED PORT INSERTION  02/25/2020   IR US GUIDANCE  01/28/2020   KNEE ARTHROSCOPY WITH MEDIAL MENISECTOMY Right 06/14/2016   Procedure: RIGHT KNEE ARTHROSCOPY WITH PARTIAL MEDIAL MENISCECTOMY, SUBCHONDROPLASTY;  Surgeon: Leandrew Koyanagi, MD;  Location: [facility];  Service: Orthopedics;  Laterality: Right;   KNEE ARTHROSCOPY WITH SUBCHONDROPLASTY Right 06/14/2016   Procedure: KNEE ARTHROSCOPY WITH SUBCHONDROPLASTY;  Surgeon: Leandrew Koyanagi, MD;  Location: [facility];  Service: Orthopedics;  Laterality: Right;   RADIOACTIVE SEED GUIDED EXCISIONAL BREAST BIOPSY Left 06/23/2014   Procedure: RADIOACTIVE SEED GUIDED EXCISIONAL BREAST BIOPSY;  Surgeon: Stark Klein, MD;  Location: [facility];  Service: General;  Laterality: Left;    I have reviewed the social history and family history with the patient and they are unchanged from previous note.  ALLERGIES:  has No Known Allergies.  MEDICATIONS:  Current Outpatient Medications  Medication Sig Dispense Refill   albuterol (PROVENTIL) (2.5 MG/3ML) 0.083% nebulizer  solution Take 3 mLs (2.5 mg total) by nebulization every 6 (six) hours as needed for wheezing. 1080 mL 3   albuterol (VENTOLIN HFA) 108 (90 Base) MCG/ACT inhaler INHALE 1 TO 2 PUFFS INTO THE LUNGS EVERY 6 HOURS AS NEEDED FOR WHEEZING OR SHORTNESS OF BREATH 54 g 1   alendronate (FOSAMAX) 70 MG tablet TAKE 1 TABLET BY MOUTH EVERY 7 DAYS. TAKE WITH A FULL GLASS OF WATER ON AN EMPTY STOMACH. 12 tablet 3   atorvastatin (LIPITOR) 40 MG tablet TAKE 1 TABLET BY MOUTH EVERY DAY 90 tablet 3   benazepril (LOTENSIN) 20 MG tablet TAKE 1 TABLET(20 MG) BY MOUTH DAILY 90 tablet 3   calcium carbonate (OS-CAL) 600 MG TABS tablet Take by mouth.     celecoxib (CELEBREX) 100 MG capsule TAKE 1 CAPSULE BY MOUTH  TWICE DAILY 180 capsule 3   cholecalciferol (VITAMIN D) 1000 units tablet Take 2,000 Units by mouth daily.     diclofenac Sodium (VOLTAREN) 1 % GEL Apply 2 g topically 4 (four) times daily. 100 g 2   famotidine (PEPCID) 40 MG tablet TAKE 1/2 TABLET(20 MG) BY MOUTH TWICE DAILY 30 tablet 3   fluticasone (FLONASE) 50 MCG/ACT nasal spray Place 1 spray into both nostrils daily. 16 g 3   Fluticasone-Salmeterol (ADVAIR DISKUS) 100-50 MCG/DOSE AEPB Inhale 1 puff into the lungs 2 (two) times daily. 60 each 11   furosemide (LASIX) 20 MG tablet Take 1 tablet by mouth once daily on day of chemo and while 5FU infusing, then take 1/2 tab (10 mg) by mouth daily 30 tablet 0   glipiZIDE (GLUCOTROL XL) 2.5 MG 24 hr tablet TAKE 1 TABLET(2.5 MG) BY MOUTH DAILY WITH BREAKFAST 30 tablet 2   glucose blood (ONETOUCH VERIO) test strip Use as instructed 100 each 12   hydrochlorothiazide (MICROZIDE) 12.5 MG capsule TAKE 1 CAPSULE(12.5 MG) BY MOUTH DAILY 90 capsule 1   HYDROcodone-acetaminophen (NORCO/VICODIN) 5-325 MG tablet Take 1 tablet by mouth 2 (two) times daily as needed for severe pain. 60 tablet 0   ipratropium (ATROVENT) 0.02 % nebulizer solution Take 0.5 mg by nebulization every 6 (six) hours as needed for wheezing or shortness of  breath.     Lancets (ONETOUCH ULTRASOFT) lancets Use as instructed 100 each 12   latanoprost (XALATAN) 0.005 % ophthalmic solution PLACE 1 DROP INTO BOTH EYES AT BEDTIME  0   loperamide (LOPERAMIDE A-D) 2 MG tablet Take 1 tablet (2 mg total) by mouth 2 (two) times daily as needed for diarrhea or loose stools. 6 tablet 0   loratadine (CLARITIN) 10 MG tablet Take 1 tablet (10 mg total) by mouth daily. 30 tablet 11   magic mouthwash w/lidocaine SOLN Take 5 mLs by mouth 4 (four) times daily as needed for mouth pain. 480 mL 2   meclizine (ANTIVERT) 12.5 MG tablet TAKE 1 TABLET(12.5 MG) BY MOUTH TWICE DAILY AS NEEDED FOR DIZZINESS 15 tablet 3   potassium chloride SA (KLOR-CON) 20 MEQ tablet Take 1 tablet (20 mEq total) by mouth 3 (three) times daily. 90 tablet 0   prochlorperazine (COMPAZINE) 10 MG tablet TAKE 1  TABLET(10 MG) BY MOUTH EVERY 6 HOURS AS NEEDED FOR NAUSEA OR VOMITING 30 tablet 3   risperiDONE (RISPERDAL) 0.25 MG tablet Take 0.25 mg by mouth daily as needed.     sertraline (ZOLOFT) 50 MG tablet Take 50 mg by mouth daily.     traZODone (DESYREL) 50 MG tablet Take 25-50 mg by mouth at bedtime as needed for sleep.      triamcinolone cream (KENALOG) 0.1 % apply to affected area twice a day 454 g 1   No current facility-administered medications for this visit.   Facility-Administered Medications Ordered in Other Visits  Medication Dose Route Frequency Provider Last Rate Last Admin   0.9 %  sodium chloride infusion   Intravenous Once Truitt Merle, MD       dexamethasone (DECADRON) 10 mg in sodium chloride 0.9 % 50 mL IVPB  10 mg Intravenous Once Truitt Merle, MD       fluorouracil (ADRUCIL) 3,950 mg in sodium chloride 0.9 % 71 mL chemo infusion  2,400 mg/m2 (Treatment Plan Recorded) Intravenous 1 day or 1 dose Truitt Merle, MD       heparin lock flush 100 unit/mL  500 Units Intracatheter Once PRN Truitt Merle, MD       irinotecan LIPOSOME (ONIVYDE) 116.1 mg in sodium chloride 0.9 % 500 mL chemo infusion   70 mg/m2 (Treatment Plan Recorded) Intravenous Once Truitt Merle, MD       leucovorin 660 mg in sodium chloride 0.9 % 250 mL infusion  400 mg/m2 (Treatment Plan Recorded) Intravenous Once Truitt Merle, MD       palonosetron (ALOXI) injection 0.25 mg  0.25 mg Intravenous Once Truitt Merle, MD       sodium chloride flush (NS) 0.9 % injection 10 mL  10 mL Intracatheter PRN Truitt Merle, MD        PHYSICAL EXAMINATION: ECOG PERFORMANCE STATUS: 0 - Asymptomatic  Vitals:   06/30/20 0930  BP: 140/73  Pulse: 82  Resp: 18  Temp: 98 F (36.7 C)  SpO2: 100%   Filed Weights   06/30/20 0930  Weight: 148 lb 3.2 oz (67.2 kg)    GENERAL:alert, no distress and comfortable SKIN: no rash  EYES:  sclera clear LUNGS: clear with normal breathing effort HEART: regular rate & rhythm, mild bilateral lower extremity edema ABDOMEN:abdomen soft, non-tender and normal bowel sounds NEURO: alert & oriented x 3 with fluent speech, no focal motor or sensory deficits per tuning fork exam over the fingertips PAC without erythema   LABORATORY DATA:  I have reviewed the data as listed CBC Latest Ref Rng & Units 06/30/2020 06/16/2020 06/02/2020  WBC 4.0 - 10.5 K/uL 5.4 4.6 3.8(L)  Hemoglobin 12.0 - 15.0 g/dL 12.2 11.6(L) 11.4(L)  Hematocrit 36.0 - 46.0 % 38.1 34.9(L) 35.3(L)  Platelets 150 - 400 K/uL 160 166 197     CMP Latest Ref Rng & Units 06/30/2020 06/16/2020 06/02/2020  Glucose 70 - 99 mg/dL 190(H) 132(H) 205(H)  BUN 8 - 23 mg/dL _0 Creatinine 0.44 - 1.00 mg/dL 0.73 0.67 0.70  Sodium 135 - 145 mmol/L 141 142 141  Potassium 3.5 - 5.1 mmol/L 3.7 3.5 3.7  Chloride 98 - 111 mmol/L 102 104 102  CO2 22 - 32 mmol/L _1 Calcium 8.9 - 10.3 mg/dL 9.3 9.2 9.4  Total Protein 6.5 - 8.1 g/dL 6.5 6.5 6.5  Total Bilirubin 0.3 - 1.2 mg/dL 0.9 0.8 1.0  Alkaline Phos 38 - 126 U/L 243(H) 238(H)  249(H)  AST 15 - 41 U/L 35 42(H) 51(H)  ALT 0 - 44 U/L _0 RADIOGRAPHIC STUDIES: I have personally reviewed the  radiological images as listed and agreed with the findings in the report. No results found.   ASSESSMENT & PLAN: Mariah Henderson is a 70 y.o. female with   1. Pancreatic cancer with Liver metastasis, stage IV  -Her CT AP from 01/13/20 showed 2cm mass on tail of pancreas abutting but not invading the gastric wall, with likely liver and omental metastasis.  -Her liver biopsy from 01/28/20 showed adenocarcinoma from pancreatic primary. Her 02/11/20 CT Chest showed no metastasis  -She began first-line palliative chemo with gemcitabine and Abraxane every 2 weeks beginning 02/26/20, she progressed in the liver  - changed to second line liposomal irinotecan/leuc and 5FU beginning 06/02/20. Dose reduced for cycle 1   2. Abdominal Pain, low appetite, 10 lbs weight loss, Transmanitis -She had abdominal pain for the month prior to cancer diagnosis. She is on Norco 5-372m BID as needed. her pain has improved since chemo  -She had sudden loss of appetite and weight loss since October 2021. -On Chemo her appetite has increased, gaining weight and eating more. -denies pain    3. Comorbidities: Asthma, Borderline Diabetic, GERD, Heart murmur, HLD, HTN, arthritis, neuropathy  -On medications, including multiple diuretics. -Managed by PCP.     4. Social support -She lives alone in 1 level house and she is retired. She has 1 adult son who lives in [place] She has a twin and 3 other sisters who live in [place] and other siblings and relative in [place]  -Her son will help her at home  -She has approved for grant to help with medication. Her sister will notify [place] the type of grant.    5. Cytopenias -secondary to chemotherapy     6. Goal of care discussion -The patient understands the goal of care is palliative. -currently full code    7. Hypokalemia secondary to diuretic -She is on Lasix and dieretics for her LE edema and heart.  -continue oral K TID -she has worsening leg edema. Doppler 05/19/20  negative for DVT -new Rx 06/16/20: lasix 20 mg po once daily on days 1-3 of chemo, then 10 mg daily    8. Genetic testing from 02/19/20 was negative for pathogenetic mutation with VUS of gene NF1    Disposition: Ms. SHalvorsenappears stable.  She completed 2 cycles of liposomal irinotecan and 5-FU, tolerating dose escalation very well without significant side effects.  Neuropathy is stable.  There is no clinical evidence of disease progression.  Labs reviewed, adequate to proceed with cycle 3 today as planned.  We will increase liposomal irinotecan to 70 mg/m2 and continue 5-FU at 2400 mg/m2.  F/up and cycle 4 in 2 weeks. The plan was reviewed with Dr. FBurr Medico   All questions were answered. The patient knows to call the clinic with any problems, questions or concerns. No barriers to learning were detected.     LAlla Feeling NP 06/30/20

## 2020-06-30 NOTE — Patient Instructions (Signed)
[facility] ONCOLOGY   Discharge Instructions: Thank you for choosing [facility] to provide your oncology and hematology care.   If you have a lab appointment with the [facility], please go directly to the [facility] and check in at the registration area.   Wear comfortable clothing and clothing appropriate for easy access to any Portacath or PICC line.   We strive to give you quality time with your provider. You may need to reschedule your appointment if you arrive late (15 or more minutes).  Arriving late affects you and other patients whose appointments are after yours.  Also, if you miss three or more appointments without notifying the office, you may be dismissed from the clinic at the provider's discretion.      For prescription refill requests, have your pharmacy contact our office and allow 72 hours for refills to be completed.    Today you received the following chemotherapy and/or immunotherapy agents: Irinotecan liposomal, leucovorin, and fluorouracil      To help prevent nausea and vomiting after your treatment, we encourage you to take your nausea medication as directed.  BELOW ARE SYMPTOMS THAT SHOULD BE REPORTED IMMEDIATELY: *FEVER GREATER THAN 100.4 F (38 C) OR HIGHER *CHILLS OR SWEATING *NAUSEA AND VOMITING THAT IS NOT CONTROLLED WITH YOUR NAUSEA MEDICATION *UNUSUAL SHORTNESS OF BREATH *UNUSUAL BRUISING OR BLEEDING *URINARY PROBLEMS (pain or burning when urinating, or frequent urination) *BOWEL PROBLEMS (unusual diarrhea, constipation, pain near the anus) TENDERNESS IN MOUTH AND THROAT WITH OR WITHOUT PRESENCE OF ULCERS (sore throat, sores in mouth, or a toothache) UNUSUAL RASH, SWELLING OR PAIN  UNUSUAL VAGINAL DISCHARGE OR ITCHING   Items with * indicate a potential emergency and should be followed up as soon as possible or go to the Emergency Department if any problems should occur.  Please show the CHEMOTHERAPY ALERT CARD  or IMMUNOTHERAPY ALERT CARD at check-in to the Emergency Department and triage nurse.  Should you have questions after your visit or need to cancel or reschedule your appointment, please contact [facility]  Dept: [phone]  and follow the prompts.  Office hours are 8:00 a.m. to 4:30 p.m. Monday - Friday. Please note that voicemails left after 4:00 p.m. may not be returned until the following business day.  We are closed weekends and major holidays. You have access to a nurse at all times for urgent questions. Please call the main number to the clinic Dept: [phone] and follow the prompts.   For any non-urgent questions, you may also contact your provider using MyChart. We now offer e-Visits for anyone 5 and older to request care online for non-urgent symptoms. For details visit [email or web address].   Also download the MyChart app! Go to the app store, search "MyChart", open the app, select [facility], and log in with your MyChart username and password.  Due to Covid, a mask is required upon entering the hospital/clinic. If you do not have a mask, one will be given to you upon arrival. For doctor visits, patients may have 1 support person aged 57 or older with them. For treatment visits, patients cannot have anyone with them due to current Covid guidelines and our immunocompromised population.

## 2020-07-01 ENCOUNTER — Other Ambulatory Visit: Payer: Self-pay | Admitting: Hematology

## 2020-07-01 ENCOUNTER — Telehealth: Payer: Self-pay | Admitting: Hematology

## 2020-07-01 NOTE — Telephone Encounter (Signed)
Left message with follow-up appointments per 6/15 los. 

## 2020-07-02 ENCOUNTER — Other Ambulatory Visit: Payer: Self-pay

## 2020-07-02 ENCOUNTER — Inpatient Hospital Stay: Payer: Medicare Other

## 2020-07-02 VITALS — BP 138/72 | HR 78 | Temp 98.2°F | Resp 18

## 2020-07-02 DIAGNOSIS — C252 Malignant neoplasm of tail of pancreas: Secondary | ICD-10-CM | POA: Diagnosis not present

## 2020-07-02 DIAGNOSIS — I10 Essential (primary) hypertension: Secondary | ICD-10-CM | POA: Diagnosis not present

## 2020-07-02 DIAGNOSIS — Z9049 Acquired absence of other specified parts of digestive tract: Secondary | ICD-10-CM | POA: Diagnosis not present

## 2020-07-02 DIAGNOSIS — Z5111 Encounter for antineoplastic chemotherapy: Secondary | ICD-10-CM | POA: Diagnosis not present

## 2020-07-02 DIAGNOSIS — K5730 Diverticulosis of large intestine without perforation or abscess without bleeding: Secondary | ICD-10-CM | POA: Diagnosis not present

## 2020-07-02 DIAGNOSIS — M1990 Unspecified osteoarthritis, unspecified site: Secondary | ICD-10-CM | POA: Diagnosis not present

## 2020-07-02 DIAGNOSIS — C787 Secondary malignant neoplasm of liver and intrahepatic bile duct: Secondary | ICD-10-CM | POA: Diagnosis not present

## 2020-07-02 DIAGNOSIS — I700 Atherosclerosis of aorta: Secondary | ICD-10-CM | POA: Diagnosis not present

## 2020-07-02 DIAGNOSIS — Z7189 Other specified counseling: Secondary | ICD-10-CM

## 2020-07-02 DIAGNOSIS — E876 Hypokalemia: Secondary | ICD-10-CM | POA: Diagnosis not present

## 2020-07-02 DIAGNOSIS — D3502 Benign neoplasm of left adrenal gland: Secondary | ICD-10-CM | POA: Diagnosis not present

## 2020-07-02 DIAGNOSIS — Z79899 Other long term (current) drug therapy: Secondary | ICD-10-CM | POA: Diagnosis not present

## 2020-07-02 MED ORDER — HEPARIN SOD (PORK) LOCK FLUSH 100 UNIT/ML IV SOLN
500.0000 [IU] | Freq: Once | INTRAVENOUS | Status: AC | PRN
  Administered 2020-07-02: 500 [IU]
  Filled 2020-07-02: qty 5

## 2020-07-02 MED ORDER — SODIUM CHLORIDE 0.9% FLUSH
10.0000 mL | INTRAVENOUS | Status: DC | PRN
  Administered 2020-07-02: 10 mL
  Filled 2020-07-02: qty 10

## 2020-07-03 DIAGNOSIS — C252 Malignant neoplasm of tail of pancreas: Secondary | ICD-10-CM | POA: Diagnosis not present

## 2020-07-13 NOTE — Progress Notes (Signed)
[place]   [phone] [phone]   Clinic Follow up Note   Patient Care Team: Sid Falcon, MD as PCP - General (Internal Medicine) Forrest Moron, [place] (Optometry) Jonnie Finner, RN as Oncology Nurse Navigator Truitt Merle, MD as Consulting Physician (Oncology) Gwyndolyn Kaufman, RN (Inactive) as Registered Nurse  Date of Service:  07/14/2020  CHIEF COMPLAINT: F/u Pancreatic cancer with liver metastasis   SUMMARY OF ONCOLOGIC HISTORY: Oncology History Overview Note  Cancer Staging Pancreatic cancer [facility]) Staging form: Exocrine Pancreas, AJCC 8th Edition - Clinical: Stage IV (cT1c, cN0, pM1) - Signed by Truitt Merle, MD on 02/26/2020 Total positive nodes: 0    Pancreatic cancer ([place])  01/13/2020 Imaging   US Abdomen 01/13/20  IMPRESSION: 1. Suggestion of 3 vascular hypoechoic mass within the liver measuring up to 2.5 cm. 2. Suggestion of a hypoechoic mass within the tail of the pancreas that is not well visualized. 3. Findings concerning for malignancy, please see separately dictated CT abdomen pelvis 01/13/2020.   01/13/2020 Imaging   CT AP 01/13/20  IMPRESSION: 1. Hypoenhancing 2.0 cm in long axis mass in the pancreatic tail compatible with pancreatic adenocarcinoma. The mass abuts the posterior gastric wall but without definite gastric invasion. The splenic vein appears narrowed/attenuated compared to prior exams and the lesion abuts the margin of the splenic artery. 2. Hypoenhancing hepatic masses are new compared to 11/22/2017 and are highly suspicious for metastatic lesions. 3. Questionable 0.8 by 0.8 cm omental tumor nodule versus dense diverticulum above the transverse colon. 4. Other imaging findings of potential clinical significance: Mild distal esophageal wall thickening, query esophagitis. Stable left adrenal adenoma. Sigmoid colon diverticulosis. Lumbar and thoracic spondylosis and degenerative disc disease  contributing to multilevel impingement. 5. Aortic atherosclerosis. Aortic Atherosclerosis (ICD10-I70.0).   01/21/2020 Initial Diagnosis   Pancreatic cancer ([place])   01/28/2020 Initial Biopsy   FINAL MICROSCOPIC DIAGNOSIS:   A. LIVER, NEEDLE CORE BIOPSY:  - Adenocarcinoma.   COMMENT:   Immunohistochemistry for CK7 is positive.  CDX-2 demonstrates weak to  moderate positive staining.  TTF-1 and PAX 8 demonstrate weak, likely  nonspecific staining.  CK20, GATA-3 and ER are negative.  The  morphologic and immunophenotypic characteristics are compatible with the  clinical impression of a primary pancreatic cancer.  Radiologic  correlation is encouraged.  If applicable, there is likely sufficient  tissue for ancillary studies (Block A2).  Dr. Vic Ripper reviewed the  case.    02/26/2020 - 05/19/2020 Chemotherapy   First-line Gemcitabine and Abraxane every 2 weeks starting 02/26/20. D/c after 05/19/20 due to disease progression in liver and neuropathy     02/26/2020 Cancer Staging   Staging form: Exocrine Pancreas, AJCC 8th Edition - Clinical: Stage IV (cT1c, cN0, pM1) - Signed by Truitt Merle, MD on 02/26/2020  Total positive nodes: 0    03/08/2020 Genetic Testing   Negative hereditary cancer genetic testing: no pathogenic variants detected in [employer] Common Hereditary Cancers Panel with preliminary evidence pancreatic cancer genes.  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 The report date is March 08, 2020.    The Common Hereditary Cancers Panel with preliminary evidence pancreatic cancer genes offered by [employer] includes sequencing and/or deletion duplication testing of the following 49 genes: APC, ATM, AXIN2, BARD1, BMPR1A, BRCA1, BRCA2, BRIP1, CDH1, CDK4, CDKN2A (p14ARF), CDKN2A (p16INK4a), CHEK2, CTNNA1, DICER1, EPCAM (Deletion/duplication testing only), GREM1 (promoter region deletion/duplication testing only), FANCC, GREM1, HOXB13, KIT, MEN1, MLH1, MSH2,  MSH3, MSH6, MUTYH, NBN,  NF1, NHTL1, PALB2, PALLD, PDGFRA, PMS2, POLD1, POLE, PTEN, RAD50, RAD51C, RAD51D, SDHA, SDHB, SDHC, SDHD, SMAD4, SMARCA4. STK11, TP53, TSC1, TSC2, and VHL.  The following genes were evaluated for sequence changes only: SDHA and HOXB13 c.251G>A variant only.es only: SDHA and HOXB13 c.251G>A variant only.   05/03/2020 Imaging   CT AP  IMPRESSION: 1. Interval decrease in size of a hypodense mass of the central pancreatic tail, consistent with treatment response of primary pancreatic malignancy. 2. Numerous low-attenuation liver lesions are seen, increased in size and number compared to prior examination. Findings are consistent with worsened hepatic metastatic disease. 3. A previously queried omental nodule adjacent to the transverse colon is more clearly a prominent diverticulum on current examination.   Aortic Atherosclerosis (ICD10-I70.0).   06/02/2020 -  Chemotherapy   Second-line FOLFIRI q2weeks starting 06/02/20       CURRENT THERAPY:  Second-line FOLFIRI q2weeks starting 06/02/20.   INTERVAL HISTORY: Mariah Henderson is here for a follow up. She was last seen by me 06/02/20 and seen by NP Lacie in interim. She presents to the clinic with a family member. She notes no issues since starting the chemo and her hair has started growing back. She denies issues with bowel movements or diarrhea, pain, or cramping. Her energy levels are slightly suppressed after chemo, but not bothersome. She is currently on cycle 4. Her weight has been fluctuating between 144-148. The pt notes her swelling has started to go down and could be the reason her weight is lower today.    REVIEW OF SYSTEMS: Constitutional: Denies fevers, chills or abnormal weight loss Eyes: Denies blurriness of vision Ears, nose, mouth, throat, and face: Denies mucositis or sore throat Respiratory: Denies cough, dyspnea or wheezes Cardiovascular: Denies palpitation, chest discomfort. lower extremity  swelling is improving. Gastrointestinal:  Denies nausea, heartburn or change in bowel habits Skin: Denies abnormal skin rashes Lymphatics: Denies new lymphadenopathy or easy bruising Neurological:Denies numbness, tingling or new weaknesses Behavioral/Psych: Mood is stable, no new changes  All other systems were reviewed with the patient and are negative.  MEDICAL HISTORY:  Past Medical History:  Diagnosis Date   Acute medial meniscus tear    right knee   Anxiety    Asthma    Cancer ([place])    Cervical spondylosis    Chronic serous otitis media    Constipation    COPD (chronic obstructive pulmonary disease) (HCC)    Depression    followed by [name]; no longer of depakote, seroquel   Diabetes mellitus without complication ([place])    Dysuria 12/27/2009   Qualifier: Diagnosis of  By: [name] MD, Amanjot     Foot drop    GERD (gastroesophageal reflux disease)    Heart murmur    Hyperlipidemia    Hypertension    Low back pain    Obesity    Paraumbilical hernia    Pre-diabetes     SURGICAL HISTORY: Past Surgical History:  Procedure Laterality Date   ABDOMINAL HYSTERECTOMY     ANTERIOR CERVICAL DECOMP/DISCECTOMY FUSION N/A 10/05/2014   Procedure: ACDF - C5-C6 - C6-C7;  Surgeon: Karie Chimera, MD;  Location: [facility];  Service: Neurosurgery;  Laterality: N/A;  ACDF - C5-C6 - C6-C7   CHOLECYSTECTOMY     COLONOSCOPY     FOOT SURGERY     HERNIA REPAIR  1999   IR IMAGING GUIDED PORT INSERTION  02/25/2020   IR US GUIDANCE  01/28/2020   KNEE ARTHROSCOPY WITH MEDIAL MENISECTOMY Right 06/14/2016  Procedure: RIGHT KNEE ARTHROSCOPY WITH PARTIAL MEDIAL MENISCECTOMY, SUBCHONDROPLASTY;  Surgeon: Leandrew Koyanagi, MD;  Location: [facility];  Service: Orthopedics;  Laterality: Right;   KNEE ARTHROSCOPY WITH SUBCHONDROPLASTY Right 06/14/2016   Procedure: KNEE ARTHROSCOPY WITH SUBCHONDROPLASTY;  Surgeon: Leandrew Koyanagi, MD;  Location: [facility];  Service:  Orthopedics;  Laterality: Right;   RADIOACTIVE SEED GUIDED EXCISIONAL BREAST BIOPSY Left 06/23/2014   Procedure: RADIOACTIVE SEED GUIDED EXCISIONAL BREAST BIOPSY;  Surgeon: Stark Klein, MD;  Location: [facility];  Service: General;  Laterality: Left;    I have reviewed the social history and family history with the patient and they are unchanged from previous note.  ALLERGIES:  has No Known Allergies.  MEDICATIONS:  Current Outpatient Medications  Medication Sig Dispense Refill   albuterol (PROVENTIL) (2.5 MG/3ML) 0.083% nebulizer solution Take 3 mLs (2.5 mg total) by nebulization every 6 (six) hours as needed for wheezing. 1080 mL 3   albuterol (VENTOLIN HFA) 108 (90 Base) MCG/ACT inhaler INHALE 1 TO 2 PUFFS INTO THE LUNGS EVERY 6 HOURS AS NEEDED FOR WHEEZING OR SHORTNESS OF BREATH 54 g 1   alendronate (FOSAMAX) 70 MG tablet TAKE 1 TABLET BY MOUTH EVERY 7 DAYS. TAKE WITH A FULL GLASS OF WATER ON AN EMPTY STOMACH. 12 tablet 3   atorvastatin (LIPITOR) 40 MG tablet TAKE 1 TABLET BY MOUTH EVERY DAY 90 tablet 3   benazepril (LOTENSIN) 20 MG tablet TAKE 1 TABLET(20 MG) BY MOUTH DAILY 90 tablet 3   calcium carbonate (OS-CAL) 600 MG TABS tablet Take by mouth.     celecoxib (CELEBREX) 100 MG capsule TAKE 1 CAPSULE BY MOUTH  TWICE DAILY 180 capsule 3   cholecalciferol (VITAMIN D) 1000 units tablet Take 2,000 Units by mouth daily.     diclofenac Sodium (VOLTAREN) 1 % GEL Apply 2 g topically 4 (four) times daily. 100 g 2   famotidine (PEPCID) 40 MG tablet TAKE 1/2 TABLET(20 MG) BY MOUTH TWICE DAILY 30 tablet 3   fluticasone (FLONASE) 50 MCG/ACT nasal spray Place 1 spray into both nostrils daily. 16 g 3   Fluticasone-Salmeterol (ADVAIR DISKUS) 100-50 MCG/DOSE AEPB Inhale 1 puff into the lungs 2 (two) times daily. 60 each 11   furosemide (LASIX) 20 MG tablet Take 1 tablet by mouth once daily on day of chemo and while 5FU infusing, then take 1/2 tab (10 mg) by mouth daily 30 tablet 1    glipiZIDE (GLUCOTROL XL) 2.5 MG 24 hr tablet TAKE 1 TABLET(2.5 MG) BY MOUTH DAILY WITH BREAKFAST 30 tablet 2   glucose blood (ONETOUCH VERIO) test strip Use as instructed 100 each 12   hydrochlorothiazide (MICROZIDE) 12.5 MG capsule TAKE 1 CAPSULE(12.5 MG) BY MOUTH DAILY 90 capsule 1   HYDROcodone-acetaminophen (NORCO/VICODIN) 5-325 MG tablet Take 1 tablet by mouth 2 (two) times daily as needed for severe pain. 60 tablet 0   ipratropium (ATROVENT) 0.02 % nebulizer solution Take 0.5 mg by nebulization every 6 (six) hours as needed for wheezing or shortness of breath.     Lancets (ONETOUCH ULTRASOFT) lancets Use as instructed 100 each 12   latanoprost (XALATAN) 0.005 % ophthalmic solution PLACE 1 DROP INTO BOTH EYES AT BEDTIME  0   loperamide (LOPERAMIDE A-D) 2 MG tablet Take 1 tablet (2 mg total) by mouth 2 (two) times daily as needed for diarrhea or loose stools. 6 tablet 0   loratadine (CLARITIN) 10 MG tablet Take 1 tablet (10 mg total) by mouth daily. [place]  tablet 11   magic mouthwash w/lidocaine SOLN Take 5 mLs by mouth 4 (four) times daily as needed for mouth pain. 480 mL 2   meclizine (ANTIVERT) 12.5 MG tablet TAKE 1 TABLET(12.5 MG) BY MOUTH TWICE DAILY AS NEEDED FOR DIZZINESS 15 tablet 3   potassium chloride SA (KLOR-CON) 20 MEQ tablet Take 1 tablet (20 mEq total) by mouth 3 (three) times daily. 90 tablet 0   prochlorperazine (COMPAZINE) 10 MG tablet TAKE 1 TABLET(10 MG) BY MOUTH EVERY 6 HOURS AS NEEDED FOR NAUSEA OR VOMITING 30 tablet 3   risperiDONE (RISPERDAL) 0.25 MG tablet Take 0.25 mg by mouth daily as needed.     sertraline (ZOLOFT) 50 MG tablet Take 50 mg by mouth daily.     traZODone (DESYREL) 50 MG tablet Take 25-50 mg by mouth at bedtime as needed for sleep.      triamcinolone cream (KENALOG) 0.1 % apply to affected area twice a day 454 g 1   No current facility-administered medications for this visit.   Facility-Administered Medications Ordered in Other Visits  Medication Dose  Route Frequency Provider Last Rate Last Admin   fluorouracil (ADRUCIL) 3,950 mg in sodium chloride 0.9 % 71 mL chemo infusion  2,400 mg/m2 (Treatment Plan Recorded) Intravenous 1 day or 1 dose Truitt Merle, MD   3,950 mg at 07/14/20 1610   heparin lock flush 100 unit/mL  500 Units Intracatheter Once PRN Truitt Merle, MD       sodium chloride flush (NS) 0.9 % injection 10 mL  10 mL Intracatheter PRN Truitt Merle, MD       sodium chloride flush (NS) 0.9 % injection 10 mL  10 mL Intracatheter PRN Truitt Merle, MD        PHYSICAL EXAMINATION: ECOG PERFORMANCE STATUS: 1 - Symptomatic but completely ambulatory  Vitals:   07/14/20 1128  BP: (!) 149/80  Pulse: 70  Resp: 18  Temp: 98.4 F (36.9 C)  SpO2: 100%   Filed Weights   07/14/20 1128  Weight: 143 lb 11.2 oz (65.2 kg)    GENERAL:alert, no distress and comfortable SKIN: skin color, texture, turgor are normal, no rashes or significant lesions EYES: normal, Conjunctiva are pink and non-injected, sclera clear  ABDOMEN:abdomen soft, non-tender and normal bowel sounds Musculoskeletal:no cyanosis of digits and no clubbing  NEURO: alert & oriented x 3 with fluent speech, no focal motor/sensory deficits  LABORATORY DATA:  I have reviewed the data as listed CBC Latest Ref Rng & Units 07/14/2020 06/30/2020 06/16/2020  WBC 4.0 - 10.5 K/uL 4.1 5.4 4.6  Hemoglobin 12.0 - 15.0 g/dL 12.2 12.2 11.6(L)  Hematocrit 36.0 - 46.0 % 37.4 38.1 34.9(L)  Platelets 150 - 400 K/uL 153 160 166     CMP Latest Ref Rng & Units 07/14/2020 06/30/2020 06/16/2020  Glucose 70 - 99 mg/dL 246(H) 190(H) 132(H)  BUN 8 - 23 mg/dL '11 10 10  ' Creatinine 0.44 - 1.00 mg/dL 0.86 0.73 0.67  Sodium 135 - 145 mmol/L 139 141 142  Potassium 3.5 - 5.1 mmol/L 3.8 3.7 3.5  Chloride 98 - 111 mmol/L 100 102 104  CO2 22 - 32 mmol/L '30 30 28  ' Calcium 8.9 - 10.3 mg/dL 9.1 9.3 9.2  Total Protein 6.5 - 8.1 g/dL 6.4(L) 6.5 6.5  Total Bilirubin 0.3 - 1.2 mg/dL 0.6 0.9 0.8  Alkaline Phos 38 - 126 U/L  236(H) 243(H) 238(H)  AST 15 - 41 U/L 34 35 42(H)  ALT 0 - 44 U/L 24 25 29  RADIOGRAPHIC STUDIES: I have personally reviewed the radiological images as listed and agreed with the findings in the report. No results found.   ASSESSMENT & PLAN:  Darsi Tien is a 70 y.o. female with    1. Pancreatic cancer with Liver metastasis, stage IV  -Her CT AP from 01/13/20 showed 2cm mass on tail of pancreas. The mass abuts the posterior gastric wall but without definite gastric invasion. Scan also shows multiple (at least 5) hepatic masses highly suspicious for metastatic lesions and Questionable 0.8 by 0.8 cm omental tumor nodule versus dense diverticulum above the transverse colon. -Her liver biopsy from 01/28/20 showed adenocarcinoma from pancreatic primary. Her 02/11/20 CT Chest showed no metastasis  -I started her on first line chemo with gemcitabine and Abraxane every 2 weeks beginning 02/26/20. D/c after 05/19/20 due to disease progression in liver and chemo related neuropathy. -To better control her disease, I recommend Second-line FOLFIRI q2weeks starting 06/02/20. I reviewed AEs again. She agrees to proceed  -Patient is tolerating the chemo very well. -Will get repeat scans after cycle 6.  -Labs not yet back today. Will proceed with C4 FOLFIRI today if lab adequate    2. Abdominal Pain, low appetite, weight loss, Transmanitis -She had abdominal pain for the past month before cancer diagnosis, 5/10. She is on Norco 5-322m BID as needed. Her pain has improved since chemo and not mentioned lately.  -She had sudden loss of appetite and weight loss since October 2021. She has overall lost 10 pounds. she has gained some weight back lately  -On Chemo her appetite has been able to increase and eating more.   -Weight mostly stable given fluctuations in leg swelling.   3. Comorbidities: Asthma, Borderline Diabetic, GERD, Heart murmur, HLD, HTN, arthritis -On medications, including multiple  diuretics. -Managed by PCP.    4. Social support -She lives alone in 1 level house and she is retired. She has 1 adult son who lives in [place] She has a twin and 3 other sisters who live in [place] and other siblings and relative in [place]  -Her son will help her at home  -She has approved for grant to help with medication. Her sister will notify me of the type of grant.    6. Goal of care discussion -The patient understands the goal of care is palliative. -I recommend DNR/DNI, she will think about it    7. Hypokalemia  -She is on Lasix and dieretics for her LE edema and heart.  -continue oral K , will increase to tid  -Continue 35-40 ounces of water daily.   8. Genetic testing from 02/19/20 was negative for pathogenetic mutation with VUS of gene NF1   9. LE Edema  -Since early May, she has been more active with moving. She attributes her LE edema to this.  -Her 05/19/2020 doppler was negative for blood clot. -Her LE edema worsened in last 2 weeks. I will restart her on Lasix 232mwith oral potassium.  I also encouraged her to elevate her feet and ware compression stocking. She is agreeable. -Leg swelling is slowly improving. Pt continues to take the medicine prescribed. -Continue use of compression socks and keeping legs elevated.     PLAN:  -Continue with C4 FOLFIRI today  -Will get repeat imaging after cycle 6. This will be in mid-August. -Refill Lasix. -Labs, flush, f/u, and FOLFIRI in 2, 4, 6, 8 weeks.     No problem-specific Assessment & Plan notes found for this encounter.  No orders of the defined types were placed in this encounter.  All questions were answered. The patient knows to call the clinic with any problems, questions or concerns. No barriers to learning was detected. The total time spent in the appointment was 30 minutes.     Truitt Merle, MD 07/14/2020   I, Reinaldo Raddle, am acting as scribe for Dr. Truitt Merle, MD.

## 2020-07-14 ENCOUNTER — Inpatient Hospital Stay: Payer: Medicare Other

## 2020-07-14 ENCOUNTER — Inpatient Hospital Stay (HOSPITAL_BASED_OUTPATIENT_CLINIC_OR_DEPARTMENT_OTHER): Payer: Medicare Other | Admitting: Hematology

## 2020-07-14 ENCOUNTER — Telehealth: Payer: Self-pay | Admitting: Hematology

## 2020-07-14 ENCOUNTER — Encounter: Payer: Self-pay | Admitting: Hematology

## 2020-07-14 ENCOUNTER — Other Ambulatory Visit: Payer: Self-pay

## 2020-07-14 VITALS — BP 149/80 | HR 70 | Temp 98.4°F | Resp 18 | Ht 60.0 in | Wt 143.7 lb

## 2020-07-14 DIAGNOSIS — C252 Malignant neoplasm of tail of pancreas: Secondary | ICD-10-CM

## 2020-07-14 DIAGNOSIS — M1990 Unspecified osteoarthritis, unspecified site: Secondary | ICD-10-CM | POA: Diagnosis not present

## 2020-07-14 DIAGNOSIS — D3502 Benign neoplasm of left adrenal gland: Secondary | ICD-10-CM | POA: Diagnosis not present

## 2020-07-14 DIAGNOSIS — Z7189 Other specified counseling: Secondary | ICD-10-CM

## 2020-07-14 DIAGNOSIS — E876 Hypokalemia: Secondary | ICD-10-CM | POA: Diagnosis not present

## 2020-07-14 DIAGNOSIS — Z95828 Presence of other vascular implants and grafts: Secondary | ICD-10-CM

## 2020-07-14 DIAGNOSIS — I10 Essential (primary) hypertension: Secondary | ICD-10-CM | POA: Diagnosis not present

## 2020-07-14 DIAGNOSIS — Z5111 Encounter for antineoplastic chemotherapy: Secondary | ICD-10-CM | POA: Diagnosis not present

## 2020-07-14 DIAGNOSIS — Z79899 Other long term (current) drug therapy: Secondary | ICD-10-CM | POA: Diagnosis not present

## 2020-07-14 DIAGNOSIS — I700 Atherosclerosis of aorta: Secondary | ICD-10-CM | POA: Diagnosis not present

## 2020-07-14 DIAGNOSIS — K5730 Diverticulosis of large intestine without perforation or abscess without bleeding: Secondary | ICD-10-CM | POA: Diagnosis not present

## 2020-07-14 DIAGNOSIS — C787 Secondary malignant neoplasm of liver and intrahepatic bile duct: Secondary | ICD-10-CM | POA: Diagnosis not present

## 2020-07-14 DIAGNOSIS — Z9049 Acquired absence of other specified parts of digestive tract: Secondary | ICD-10-CM | POA: Diagnosis not present

## 2020-07-14 LAB — CMP (CANCER CENTER ONLY)
ALT: 24 U/L (ref 0–44)
AST: 34 U/L (ref 15–41)
Albumin: 2.7 g/dL — ABNORMAL LOW (ref 3.5–5.0)
Alkaline Phosphatase: 236 U/L — ABNORMAL HIGH (ref 38–126)
Anion gap: 9 (ref 5–15)
BUN: 11 mg/dL (ref 8–23)
CO2: 30 mmol/L (ref 22–32)
Calcium: 9.1 mg/dL (ref 8.9–10.3)
Chloride: 100 mmol/L (ref 98–111)
Creatinine: 0.86 mg/dL (ref 0.44–1.00)
GFR, Estimated: >60 mL/min (ref >60)
Glucose, Bld: 246 mg/dL — ABNORMAL HIGH (ref 70–99)
Potassium: 3.8 mmol/L (ref 3.5–5.1)
Sodium: 139 mmol/L (ref 135–145)
Total Bilirubin: 0.6 mg/dL (ref 0.3–1.2)
Total Protein: 6.4 g/dL — ABNORMAL LOW (ref 6.5–8.1)

## 2020-07-14 LAB — CBC WITH DIFFERENTIAL (CANCER CENTER ONLY)
Abs Immature Granulocytes: 0.01 10*3/uL (ref 0.00–0.07)
Basophils Absolute: 0.1 10*3/uL (ref 0.0–0.1)
Basophils Relative: 2 %
Eosinophils Absolute: 0.4 10*3/uL (ref 0.0–0.5)
Eosinophils Relative: 10 %
HCT: 37.4 % (ref 36.0–46.0)
Hemoglobin: 12.2 g/dL (ref 12.0–15.0)
Immature Granulocytes: 0 %
Lymphocytes Relative: 13 %
Lymphs Abs: 0.5 10*3/uL — ABNORMAL LOW (ref 0.7–4.0)
MCH: 28.9 pg (ref 26.0–34.0)
MCHC: 32.6 g/dL (ref 30.0–36.0)
MCV: 88.6 fL (ref 80.0–100.0)
Monocytes Absolute: 0.6 10*3/uL (ref 0.1–1.0)
Monocytes Relative: 15 %
Neutro Abs: 2.5 10*3/uL (ref 1.7–7.7)
Neutrophils Relative %: 60 %
Platelet Count: 153 10*3/uL (ref 150–400)
RBC: 4.22 MIL/uL (ref 3.87–5.11)
RDW: 17.2 % — ABNORMAL HIGH (ref 11.5–15.5)
WBC Count: 4.1 10*3/uL (ref 4.0–10.5)
nRBC: 0 % (ref 0.0–0.2)

## 2020-07-14 MED ORDER — SODIUM CHLORIDE 0.9% FLUSH
10.0000 mL | INTRAVENOUS | Status: DC | PRN
  Filled 2020-07-14: qty 10

## 2020-07-14 MED ORDER — PALONOSETRON HCL INJECTION 0.25 MG/5ML
INTRAVENOUS | Status: AC
  Filled 2020-07-14: qty 5

## 2020-07-14 MED ORDER — SODIUM CHLORIDE 0.9 % IV SOLN
400.0000 mg/m2 | Freq: Once | INTRAVENOUS | Status: AC
  Administered 2020-07-14: 660 mg via INTRAVENOUS
  Filled 2020-07-14: qty 33

## 2020-07-14 MED ORDER — SODIUM CHLORIDE 0.9 % IV SOLN
Freq: Once | INTRAVENOUS | Status: AC
  Filled 2020-07-14: qty 250

## 2020-07-14 MED ORDER — SODIUM CHLORIDE 0.9 % IV SOLN
2400.0000 mg/m2 | INTRAVENOUS | Status: DC
  Administered 2020-07-14: 3950 mg via INTRAVENOUS
  Filled 2020-07-14: qty 79

## 2020-07-14 MED ORDER — SODIUM CHLORIDE 0.9 % IV SOLN
10.0000 mg | Freq: Once | INTRAVENOUS | Status: AC
  Administered 2020-07-14: 10 mg via INTRAVENOUS
  Filled 2020-07-14: qty 10

## 2020-07-14 MED ORDER — FUROSEMIDE 20 MG PO TABS
ORAL_TABLET | ORAL | 1 refills | Status: DC
Start: 2020-07-14 — End: 2020-08-25

## 2020-07-14 MED ORDER — SODIUM CHLORIDE 0.9% FLUSH
10.0000 mL | Freq: Once | INTRAVENOUS | Status: AC
  Administered 2020-07-14: 10 mL
  Filled 2020-07-14: qty 10

## 2020-07-14 MED ORDER — SODIUM CHLORIDE 0.9 % IV SOLN
70.0000 mg/m2 | Freq: Once | INTRAVENOUS | Status: AC
  Administered 2020-07-14: 116.1 mg via INTRAVENOUS
  Filled 2020-07-14: qty 27

## 2020-07-14 MED ORDER — PALONOSETRON HCL INJECTION 0.25 MG/5ML
0.2500 mg | Freq: Once | INTRAVENOUS | Status: AC
  Administered 2020-07-14: 0.25 mg via INTRAVENOUS

## 2020-07-14 NOTE — Telephone Encounter (Signed)
Scheduled per los. Gave avs and calendar  

## 2020-07-14 NOTE — Patient Instructions (Signed)
Mariah Henderson   Discharge Instructions: Thank you for choosing [facility] to provide your [facility] and hematology care.   If you have a lab appointment with the [facility], please go directly to the [facility] and check in at the registration area.   Wear comfortable clothing and clothing appropriate for easy access to any Portacath or PICC line.   We strive to give you quality time with your provider. You may need to reschedule your appointment if you arrive late (15 or more minutes).  Arriving late affects you and other patients whose appointments are after yours.  Also, if you miss three or more appointments without notifying the office, you may be dismissed from the clinic at the provider's discretion.      For prescription refill requests, have your pharmacy contact our office and allow 72 hours for refills to be completed.    Today you received the following chemotherapy and/or immunotherapy agents: Irinotecan liposomal, leucovorin, and fluorouracil      To help prevent nausea and vomiting after your treatment, we encourage you to take your nausea medication as directed.  BELOW ARE SYMPTOMS THAT SHOULD BE REPORTED IMMEDIATELY: *FEVER GREATER THAN 100.4 F (38 C) OR HIGHER *CHILLS OR SWEATING *NAUSEA AND VOMITING THAT IS NOT CONTROLLED WITH YOUR NAUSEA MEDICATION *UNUSUAL SHORTNESS OF BREATH *UNUSUAL BRUISING OR BLEEDING *URINARY PROBLEMS (pain or burning when urinating, or frequent urination) *BOWEL PROBLEMS (unusual diarrhea, constipation, pain near the anus) TENDERNESS IN MOUTH AND THROAT WITH OR WITHOUT PRESENCE OF ULCERS (sore throat, sores in mouth, or a toothache) UNUSUAL RASH, SWELLING OR PAIN  UNUSUAL VAGINAL DISCHARGE OR ITCHING   Items with * indicate a potential emergency and should be followed up as soon as possible or go to the Emergency Department if any problems should occur.  Please show the CHEMOTHERAPY ALERT CARD  or IMMUNOTHERAPY ALERT CARD at check-in to the Emergency Department and triage nurse.  Should you have questions after your visit or need to cancel or reschedule your appointment, please contact [facility]  Dept: [phone]  and follow the prompts.  Office hours are 8:00 a.m. to 4:30 p.m. Monday - Friday. Please note that voicemails left after 4:00 p.m. may not be returned until the following business day.  We are closed weekends and major holidays. You have access to a nurse at all times for urgent questions. Please call the main number to the clinic Dept: [phone] and follow the prompts.   For any non-urgent questions, you may also contact your provider using MyChart. We now offer e-Visits for anyone 58 and older to request care online for non-urgent symptoms. For details visit [email or web address].   Also download the MyChart app! Go to the app store, search "MyChart", open the app, select [facility], and log in with your MyChart username and password.  Due to Covid, a mask is required upon entering the hospital/clinic. If you do not have a mask, one will be given to you upon arrival. For doctor visits, patients may have 1 support person aged 14 or older with them. For treatment visits, patients cannot have anyone with them due to current Covid guidelines and our immunocompromised population.

## 2020-07-16 ENCOUNTER — Other Ambulatory Visit: Payer: Self-pay

## 2020-07-16 ENCOUNTER — Inpatient Hospital Stay: Payer: Medicare Other | Attending: Hematology

## 2020-07-16 VITALS — BP 127/67 | HR 77 | Resp 16

## 2020-07-16 DIAGNOSIS — Z452 Encounter for adjustment and management of vascular access device: Secondary | ICD-10-CM | POA: Insufficient documentation

## 2020-07-16 DIAGNOSIS — C252 Malignant neoplasm of tail of pancreas: Secondary | ICD-10-CM | POA: Diagnosis not present

## 2020-07-16 DIAGNOSIS — C787 Secondary malignant neoplasm of liver and intrahepatic bile duct: Secondary | ICD-10-CM | POA: Diagnosis not present

## 2020-07-16 DIAGNOSIS — Z7189 Other specified counseling: Secondary | ICD-10-CM

## 2020-07-16 DIAGNOSIS — Z5111 Encounter for antineoplastic chemotherapy: Secondary | ICD-10-CM | POA: Insufficient documentation

## 2020-07-16 MED ORDER — HEPARIN SOD (PORK) LOCK FLUSH 100 UNIT/ML IV SOLN
500.0000 [IU] | Freq: Once | INTRAVENOUS | Status: AC | PRN
  Administered 2020-07-16: 500 [IU]
  Filled 2020-07-16: qty 5

## 2020-07-16 MED ORDER — SODIUM CHLORIDE 0.9% FLUSH
10.0000 mL | INTRAVENOUS | Status: DC | PRN
  Administered 2020-07-16: 10 mL
  Filled 2020-07-16: qty 10

## 2020-07-20 ENCOUNTER — Encounter: Payer: Self-pay | Admitting: *Deleted

## 2020-07-25 NOTE — Progress Notes (Signed)
[place]   Telephone:(336) [phone] Fax:(336) [phone]   Clinic Follow up Note   Patient Care Team: Mariah Falcon, MD as PCP - General (Internal Medicine) Mariah Henderson, [place] (Optometry) Mariah Finner, RN (Inactive) as Oncology Nurse Navigator Mariah Merle, MD as Consulting Physician (Oncology) Mariah Kaufman, RN (Inactive) as Registered Nurse 07/28/2020  CHIEF COMPLAINT: Follow up metastatic pancreas cancer   SUMMARY OF ONCOLOGIC HISTORY: Oncology History Overview Note  Cancer Staging Pancreatic cancer [facility]) Staging form: Exocrine Pancreas, AJCC 8th Edition - Clinical: Stage IV (cT1c, cN0, pM1) - Signed by Mariah Merle, MD on 02/26/2020 Total positive nodes: 0    Pancreatic cancer ([place])  01/13/2020 Imaging   US Abdomen 01/13/20  IMPRESSION: 1. Suggestion of 3 vascular hypoechoic mass within the liver measuring up to 2.5 cm. 2. Suggestion of a hypoechoic mass within the tail of the pancreas that is not well visualized. 3. Findings concerning for malignancy, please see separately dictated CT abdomen pelvis 01/13/2020.   01/13/2020 Imaging   CT AP 01/13/20  IMPRESSION: 1. Hypoenhancing 2.0 cm in long axis mass in the pancreatic tail compatible with pancreatic adenocarcinoma. The mass abuts the posterior gastric wall but without definite gastric invasion. The splenic vein appears narrowed/attenuated compared to prior exams and the lesion abuts the margin of the splenic artery. 2. Hypoenhancing hepatic masses are new compared to 11/22/2017 and are highly suspicious for metastatic lesions. 3. Questionable 0.8 by 0.8 cm omental tumor nodule versus dense diverticulum above the transverse colon. 4. Other imaging findings of potential clinical significance: Mild distal esophageal wall thickening, query esophagitis. Stable left adrenal adenoma. Sigmoid colon diverticulosis. Lumbar and thoracic spondylosis and degenerative disc disease contributing to  multilevel impingement. 5. Aortic atherosclerosis. Aortic Atherosclerosis (ICD10-I70.0).   01/21/2020 Initial Diagnosis   Pancreatic cancer ([place])   01/28/2020 Initial Biopsy   FINAL MICROSCOPIC DIAGNOSIS:   A. LIVER, NEEDLE CORE BIOPSY:  - Adenocarcinoma.   COMMENT:   Immunohistochemistry for CK7 is positive.  CDX-2 demonstrates weak to  moderate positive staining.  TTF-1 and PAX 8 demonstrate weak, likely  nonspecific staining.  CK20, GATA-3 and ER are negative.  The  morphologic and immunophenotypic characteristics are compatible with the  clinical impression of a primary pancreatic cancer.  Radiologic  correlation is encouraged.  If applicable, there is likely sufficient  tissue for ancillary studies (Block A2).  Mariah Henderson reviewed the  case.    02/26/2020 - 05/19/2020 Chemotherapy   First-line Gemcitabine and Abraxane every 2 weeks starting 02/26/20. D/c after 05/19/20 due to disease progression in liver and neuropathy     02/26/2020 Cancer Staging   Staging form: Exocrine Pancreas, AJCC 8th Edition - Clinical: Stage IV (cT1c, cN0, pM1) - Signed by Mariah Merle, MD on 02/26/2020  Total positive nodes: 0    03/08/2020 Genetic Testing   Negative hereditary cancer genetic testing: no pathogenic variants detected in [facility] Common Hereditary Cancers Panel with preliminary evidence pancreatic cancer genes.  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 The report date is March 08, 2020.    The Common Hereditary Cancers Panel with preliminary evidence pancreatic cancer genes offered by [facility] includes sequencing and/or deletion duplication testing of the following 49 genes: APC, ATM, AXIN2, BARD1, BMPR1A, BRCA1, BRCA2, BRIP1, CDH1, CDK4, CDKN2A (p14ARF), CDKN2A (p16INK4a), CHEK2, CTNNA1, DICER1, EPCAM (Deletion/duplication testing only), GREM1 (promoter region deletion/duplication testing only), FANCC, GREM1, HOXB13, KIT, MEN1, MLH1, MSH2, MSH3, MSH6,  MUTYH, NBN, NF1, NHTL1, PALB2, PALLD, PDGFRA,  PMS2, POLD1, POLE, PTEN, RAD50, RAD51C, RAD51D, SDHA, SDHB, SDHC, SDHD, SMAD4, SMARCA4. STK11, TP53, TSC1, TSC2, and VHL.  The following genes were evaluated for sequence changes only: SDHA and HOXB13 c.251G>A variant only.es only: SDHA and HOXB13 c.251G>A variant only.   05/03/2020 Imaging   CT AP  IMPRESSION: 1. Interval decrease in size of a hypodense mass of the central pancreatic tail, consistent with treatment response of primary pancreatic malignancy. 2. Numerous low-attenuation liver lesions are seen, increased in size and number compared to prior examination. Findings are consistent with worsened hepatic metastatic disease. 3. A previously queried omental nodule adjacent to the transverse colon is more clearly a prominent diverticulum on current examination.   Aortic Atherosclerosis (ICD10-I70.0).   06/02/2020 -  Chemotherapy   Second-line FOLFIRI q2weeks starting 06/02/20      CURRENT THERAPY: FOLFIRI with liposomal irinotecan q2 weeks  INTERVAL HISTORY: Mariah Henderson returns for follow up and treatment as scheduled. She was last seen 07/14/20 and completed cycle 4 FOLFIRI. She is going well. Ate salty foods recently and BP is up today. She is losing her nails and has more sensitivity in her fingertips. She had prior to chemo, stable lately. Manages well at home. Denies new/worsening abdominal pain. Eating and drinking well, no n/v/c/d. Energy is adequate. Denies fever, chills, cough, chest pain, dyspnea, or other new concerns.     MEDICAL HISTORY:  Past Medical History:  Diagnosis Date   Acute medial meniscus tear    right knee   Anxiety    Asthma    Cancer ([place])    Cervical spondylosis    Chronic serous otitis media    Constipation    COPD (chronic obstructive pulmonary disease) (HCC)    Depression    followed by [name]; no longer of depakote, seroquel   Diabetes mellitus without complication ([place])    Dysuria  12/27/2009   Qualifier: Diagnosis of  By: Mariah Homes MD, Mariah Henderson     Foot drop    GERD (gastroesophageal reflux disease)    Heart murmur    Hyperlipidemia    Hypertension    Low back pain    Obesity    Paraumbilical hernia    Pre-diabetes     SURGICAL HISTORY: Past Surgical History:  Procedure Laterality Date   ABDOMINAL HYSTERECTOMY     ANTERIOR CERVICAL DECOMP/DISCECTOMY FUSION N/A 10/05/2014   Procedure: ACDF - C5-C6 - C6-C7;  Surgeon: Karie Chimera, MD;  Location: [facility];  Service: Neurosurgery;  Laterality: N/A;  ACDF - C5-C6 - C6-C7   CHOLECYSTECTOMY     COLONOSCOPY     FOOT SURGERY     HERNIA REPAIR  1999   IR IMAGING GUIDED PORT INSERTION  02/25/2020   IR US GUIDANCE  01/28/2020   KNEE ARTHROSCOPY WITH MEDIAL MENISECTOMY Right 06/14/2016   Procedure: RIGHT KNEE ARTHROSCOPY WITH PARTIAL MEDIAL MENISCECTOMY, SUBCHONDROPLASTY;  Surgeon: Leandrew Koyanagi, MD;  Location: [place] [facility];  Service: Orthopedics;  Laterality: Right;   KNEE ARTHROSCOPY WITH SUBCHONDROPLASTY Right 06/14/2016   Procedure: KNEE ARTHROSCOPY WITH SUBCHONDROPLASTY;  Surgeon: Leandrew Koyanagi, MD;  Location: [facility];  Service: Orthopedics;  Laterality: Right;   RADIOACTIVE SEED GUIDED EXCISIONAL BREAST BIOPSY Left 06/23/2014   Procedure: RADIOACTIVE SEED GUIDED EXCISIONAL BREAST BIOPSY;  Surgeon: Stark Klein, MD;  Location: [place] [place];  Service: General;  Laterality: Left;    I have reviewed the social history and family history with the patient and they are unchanged from previous  note.  ALLERGIES:  has No Known Allergies.  MEDICATIONS:  Current Outpatient Medications  Medication Sig Dispense Refill   albuterol (PROVENTIL) (2.5 MG/3ML) 0.083% nebulizer solution Take 3 mLs (2.5 mg total) by nebulization every 6 (six) hours as needed for wheezing. 1080 mL 3   albuterol (VENTOLIN HFA) 108 (90 Base) MCG/ACT inhaler INHALE 1 TO 2 PUFFS INTO THE LUNGS EVERY 6 HOURS AS NEEDED  FOR WHEEZING OR SHORTNESS OF BREATH 54 g 1   alendronate (FOSAMAX) 70 MG tablet TAKE 1 TABLET BY MOUTH EVERY 7 DAYS. TAKE WITH A FULL GLASS OF WATER ON AN EMPTY STOMACH. 12 tablet 3   atorvastatin (LIPITOR) 40 MG tablet TAKE 1 TABLET BY MOUTH EVERY DAY 90 tablet 3   benazepril (LOTENSIN) 20 MG tablet TAKE 1 TABLET(20 MG) BY MOUTH DAILY 90 tablet 3   calcium carbonate (OS-CAL) 600 MG TABS tablet Take by mouth.     celecoxib (CELEBREX) 100 MG capsule TAKE 1 CAPSULE BY MOUTH  TWICE DAILY 180 capsule 3   cholecalciferol (VITAMIN D) 1000 units tablet Take 2,000 Units by mouth daily.     diclofenac Sodium (VOLTAREN) 1 % GEL Apply 2 g topically 4 (four) times daily. 100 g 2   famotidine (PEPCID) 40 MG tablet TAKE 1/2 TABLET(20 MG) BY MOUTH TWICE DAILY 30 tablet 3   fluticasone (FLONASE) 50 MCG/ACT nasal spray Place 1 spray into both nostrils daily. 16 g 3   Fluticasone-Salmeterol (ADVAIR DISKUS) 100-50 MCG/DOSE AEPB Inhale 1 puff into the lungs 2 (two) times daily. 60 each 11   furosemide (LASIX) 20 MG tablet Take 1 tablet by mouth once daily on day of chemo and while 5FU infusing, then take 1/2 tab (10 mg) by mouth daily 30 tablet 1   glipiZIDE (GLUCOTROL XL) 2.5 MG 24 hr tablet TAKE 1 TABLET(2.5 MG) BY MOUTH DAILY WITH BREAKFAST 30 tablet 2   glucose blood (ONETOUCH VERIO) test strip Use as instructed 100 each 12   hydrochlorothiazide (MICROZIDE) 12.5 MG capsule TAKE 1 CAPSULE(12.5 MG) BY MOUTH DAILY 90 capsule 1   HYDROcodone-acetaminophen (NORCO/VICODIN) 5-325 MG tablet Take 1 tablet by mouth 2 (two) times daily as needed for severe pain. 60 tablet 0   ipratropium (ATROVENT) 0.02 % nebulizer solution Take 0.5 mg by nebulization every 6 (six) hours as needed for wheezing or shortness of breath.     Lancets (ONETOUCH ULTRASOFT) lancets Use as instructed 100 each 12   latanoprost (XALATAN) 0.005 % ophthalmic solution PLACE 1 DROP INTO BOTH EYES AT BEDTIME  0   loperamide (LOPERAMIDE A-D) 2 MG tablet  Take 1 tablet (2 mg total) by mouth 2 (two) times daily as needed for diarrhea or loose stools. 6 tablet 0   loratadine (CLARITIN) 10 MG tablet Take 1 tablet (10 mg total) by mouth daily. 30 tablet 11   magic mouthwash w/lidocaine SOLN Take 5 mLs by mouth 4 (four) times daily as needed for mouth pain. 480 mL 2   meclizine (ANTIVERT) 12.5 MG tablet TAKE 1 TABLET(12.5 MG) BY MOUTH TWICE DAILY AS NEEDED FOR DIZZINESS 15 tablet 3   potassium chloride SA (KLOR-CON) 20 MEQ tablet TAKE 1 TABLET(20 MEQ) BY MOUTH THREE TIMES DAILY 90 tablet 0   prochlorperazine (COMPAZINE) 10 MG tablet TAKE 1 TABLET(10 MG) BY MOUTH EVERY 6 HOURS AS NEEDED FOR NAUSEA OR VOMITING 30 tablet 3   risperiDONE (RISPERDAL) 0.25 MG tablet Take 0.25 mg by mouth daily as needed.     sertraline (ZOLOFT) 50 MG tablet  Take 50 mg by mouth daily.     traZODone (DESYREL) 50 MG tablet Take 25-50 mg by mouth at bedtime as needed for sleep.      triamcinolone cream (KENALOG) 0.1 % apply to affected area twice a day 454 g 1   No current facility-administered medications for this visit.   Facility-Administered Medications Ordered in Other Visits  Medication Dose Route Frequency Provider Last Rate Last Admin   fluorouracil (ADRUCIL) 3,950 mg in sodium chloride 0.9 % 71 mL chemo infusion  2,400 mg/m2 (Treatment Plan Recorded) Intravenous 1 day or 1 dose Mariah Merle, MD       heparin lock flush 100 unit/mL  500 Units Intracatheter Once PRN Mariah Merle, MD       heparin lock flush 100 unit/mL  500 Units Intracatheter Once PRN Mariah Merle, MD       irinotecan LIPOSOME (ONIVYDE) 116.1 mg in sodium chloride 0.9 % 500 mL chemo infusion  70 mg/m2 (Treatment Plan Recorded) Intravenous Once Mariah Merle, MD 351 mL/hr at 07/28/20 1210 116.1 mg at 07/28/20 1210   leucovorin 660 mg in sodium chloride 0.9 % 250 mL infusion  400 mg/m2 (Treatment Plan Recorded) Intravenous Once Mariah Merle, MD       sodium chloride flush (NS) 0.9 % injection 10 mL  10 mL Intracatheter  PRN Mariah Merle, MD       sodium chloride flush (NS) 0.9 % injection 10 mL  10 mL Intracatheter PRN Mariah Merle, MD        PHYSICAL EXAMINATION: ECOG PERFORMANCE STATUS: 0 - Asymptomatic  Vitals:   07/28/20 1023  BP: (!) 154/74  Pulse: 68  Resp: 18  Temp: 98.7 F (37.1 C)  SpO2: 99%   Filed Weights   07/28/20 1023  Weight: 146 lb (66.2 kg)    GENERAL:alert, no distress and comfortable SKIN: no rash  EYES: sclera clear ORAL: no thrush or ulcers  LUNGS:  normal breathing effort HEART: mild lower extremity edema NEURO: alert & oriented x 3 with fluent speech, no focal motor/sensory deficits PAC without erythema   LABORATORY DATA:  I have reviewed the data as listed CBC Latest Ref Rng & Units 07/28/2020 07/14/2020 06/30/2020  WBC 4.0 - 10.5 K/uL 3.7(L) 4.1 5.4  Hemoglobin 12.0 - 15.0 g/dL 12.1 12.2 12.2  Hematocrit 36.0 - 46.0 % 37.1 37.4 38.1  Platelets 150 - 400 K/uL 126(L) 153 160     CMP Latest Ref Rng & Units 07/28/2020 07/14/2020 06/30/2020  Glucose 70 - 99 mg/dL 217(H) 246(H) 190(H)  BUN 8 - 23 mg/dL '8 11 10  ' Creatinine 0.44 - 1.00 mg/dL 0.81 0.86 0.73  Sodium 135 - 145 mmol/L 140 139 141  Potassium 3.5 - 5.1 mmol/L 4.1 3.8 3.7  Chloride 98 - 111 mmol/L 104 100 102  CO2 22 - 32 mmol/L '30 30 30  ' Calcium 8.9 - 10.3 mg/dL 9.5 9.1 9.3  Total Protein 6.5 - 8.1 g/dL 6.4(L) 6.4(L) 6.5  Total Bilirubin 0.3 - 1.2 mg/dL 0.6 0.6 0.9  Alkaline Phos 38 - 126 U/L 213(H) 236(H) 243(H)  AST 15 - 41 U/L 38 34 35  ALT 0 - 44 U/L '28 24 25      ' RADIOGRAPHIC STUDIES: I have personally reviewed the radiological images as listed and agreed with the findings in the report. No results found.   ASSESSMENT & PLAN: Mariah Henderson is a 70 y.o. female with   1. Pancreatic cancer with Liver metastasis, stage IV  -Her CT AP  from 01/13/20 showed 2cm mass on tail of pancreas abutting but not invading the gastric wall, with likely liver and omental metastasis.  -Her liver biopsy from 01/28/20  showed adenocarcinoma from pancreatic primary. Her 02/11/20 CT Chest showed no metastasis  -She began first-line palliative chemo with gemcitabine and Abraxane every 2 weeks beginning 02/26/20, she progressed in the liver  - changed to second line liposomal irinotecan/leuc and 5FU beginning 06/02/20. Dose reduced for cycle 1   2. Abdominal Pain, low appetite, 10 lbs weight loss, Transmanitis -She had abdominal pain for the month prior to cancer diagnosis. She is on Norco 5-346m BID as needed. her pain has improved since chemo  -She had sudden loss of appetite and weight loss since October 2021. -On Chemo her appetite has increased, gaining weight and eating more. -denies pain   3. Comorbidities: Asthma, Borderline Diabetic, GERD, Heart murmur, HLD, HTN, arthritis, neuropathy  -On medications, including multiple diuretics. -Managed by PCP.   -compression stockings for leg edema    4. Social support -She lives alone in 1 level house and she is retired. She has 1 adult son who [place] in [place] She has a twin and 3 other sisters who [place] and other siblings and relative [place]  -Her son will help her at home  -She has approved for grant to help with medication. Her sister will notify [place].    5. Cytopenias -secondary to chemotherapy     6. Goal of care discussion -The patient understands the goal of care is palliative. -currently full code    7. Hypokalemia secondary to diuretic -She is on Lasix and dieretics for her LE edema and heart.  -continue oral K TID -she has worsening leg edema. Doppler 05/19/20 negative for DVT -new Rx 06/16/20: lasix 20 mg po once daily on days 1-3 of chemo, then 10 mg daily    8. Genetic testing from 02/19/20 was negative for pathogenetic mutation with VUS of gene NF1  Disposition: Ms. SCarriggappears stable.  She has completed 4 cycles of liposomal irinotecan/leuc and 5-FU.  She tolerates treatment very well without significant  toxicities.  She is able to recover and function well.  There is no clinical evidence of disease progression.  Labs reviewed, CBC and CMP adequate for cycle 5 liposomal irinotecan/leuc and 5FU today as planned, at same dose.  She will return for follow-up and cycle 6 in 2 weeks. We will repeat a scan after cycle 6.   Orders Placed This Encounter  Procedures   CT CHEST ABDOMEN PELVIS W CONTRAST    Standing Status:   Future    Standing Expiration Date:   07/28/2021    Order Specific Question:   If indicated for the ordered procedure, I authorize the administration of contrast media per Radiology protocol    Answer:   Yes    Order Specific Question:   [facility]    [facility]   Order Specific Question:   Is Oral Contrast requested for this exam?    Answer:   Yes, Per Radiology protocol    Order Specific Question:   Reason for Exam (SYMPTOM  OR DIAGNOSIS REQUIRED)    Answer:   restage pancreas cancer after 6 cycles chemo    All questions were answered. The patient knows to call the clinic with any problems, questions or concerns. No barriers to learning were detected.     LAlla Feeling  NP 07/28/20

## 2020-07-28 ENCOUNTER — Other Ambulatory Visit: Payer: Self-pay | Admitting: Nurse Practitioner

## 2020-07-28 ENCOUNTER — Inpatient Hospital Stay (HOSPITAL_BASED_OUTPATIENT_CLINIC_OR_DEPARTMENT_OTHER): Payer: Medicare Other | Admitting: Nurse Practitioner

## 2020-07-28 ENCOUNTER — Inpatient Hospital Stay: Payer: Medicare Other

## 2020-07-28 ENCOUNTER — Other Ambulatory Visit: Payer: Self-pay

## 2020-07-28 ENCOUNTER — Encounter: Payer: Self-pay | Admitting: Nurse Practitioner

## 2020-07-28 VITALS — BP 154/74 | HR 68 | Temp 98.7°F | Resp 18 | Wt 146.0 lb

## 2020-07-28 DIAGNOSIS — Z95828 Presence of other vascular implants and grafts: Secondary | ICD-10-CM

## 2020-07-28 DIAGNOSIS — C252 Malignant neoplasm of tail of pancreas: Secondary | ICD-10-CM

## 2020-07-28 DIAGNOSIS — Z5111 Encounter for antineoplastic chemotherapy: Secondary | ICD-10-CM | POA: Diagnosis not present

## 2020-07-28 DIAGNOSIS — Z452 Encounter for adjustment and management of vascular access device: Secondary | ICD-10-CM | POA: Diagnosis not present

## 2020-07-28 DIAGNOSIS — Z7189 Other specified counseling: Secondary | ICD-10-CM

## 2020-07-28 DIAGNOSIS — C787 Secondary malignant neoplasm of liver and intrahepatic bile duct: Secondary | ICD-10-CM | POA: Diagnosis not present

## 2020-07-28 LAB — CBC WITH DIFFERENTIAL (CANCER CENTER ONLY)
Abs Immature Granulocytes: 0.01 10*3/uL (ref 0.00–0.07)
Basophils Absolute: 0.1 10*3/uL (ref 0.0–0.1)
Basophils Relative: 2 %
Eosinophils Absolute: 0.3 10*3/uL (ref 0.0–0.5)
Eosinophils Relative: 8 %
HCT: 37.1 % (ref 36.0–46.0)
Hemoglobin: 12.1 g/dL (ref 12.0–15.0)
Immature Granulocytes: 0 %
Lymphocytes Relative: 12 %
Lymphs Abs: 0.4 10*3/uL — ABNORMAL LOW (ref 0.7–4.0)
MCH: 28.9 pg (ref 26.0–34.0)
MCHC: 32.6 g/dL (ref 30.0–36.0)
MCV: 88.8 fL (ref 80.0–100.0)
Monocytes Absolute: 0.5 10*3/uL (ref 0.1–1.0)
Monocytes Relative: 15 %
Neutro Abs: 2.4 10*3/uL (ref 1.7–7.7)
Neutrophils Relative %: 63 %
Platelet Count: 126 10*3/uL — ABNORMAL LOW (ref 150–400)
RBC: 4.18 MIL/uL (ref 3.87–5.11)
RDW: 17.6 % — ABNORMAL HIGH (ref 11.5–15.5)
WBC Count: 3.7 10*3/uL — ABNORMAL LOW (ref 4.0–10.5)
nRBC: 0 % (ref 0.0–0.2)

## 2020-07-28 LAB — CMP (CANCER CENTER ONLY)
ALT: 28 U/L (ref 0–44)
AST: 38 U/L (ref 15–41)
Albumin: 2.7 g/dL — ABNORMAL LOW (ref 3.5–5.0)
Alkaline Phosphatase: 213 U/L — ABNORMAL HIGH (ref 38–126)
Anion gap: 6 (ref 5–15)
BUN: 8 mg/dL (ref 8–23)
CO2: 30 mmol/L (ref 22–32)
Calcium: 9.5 mg/dL (ref 8.9–10.3)
Chloride: 104 mmol/L (ref 98–111)
Creatinine: 0.81 mg/dL (ref 0.44–1.00)
GFR, Estimated: >60 mL/min (ref >60)
Glucose, Bld: 217 mg/dL — ABNORMAL HIGH (ref 70–99)
Potassium: 4.1 mmol/L (ref 3.5–5.1)
Sodium: 140 mmol/L (ref 135–145)
Total Bilirubin: 0.6 mg/dL (ref 0.3–1.2)
Total Protein: 6.4 g/dL — ABNORMAL LOW (ref 6.5–8.1)

## 2020-07-28 MED ORDER — SODIUM CHLORIDE 0.9% FLUSH
10.0000 mL | INTRAVENOUS | Status: DC | PRN
  Filled 2020-07-28: qty 10

## 2020-07-28 MED ORDER — SODIUM CHLORIDE 0.9 % IV SOLN
2400.0000 mg/m2 | INTRAVENOUS | Status: DC
  Administered 2020-07-28: 3950 mg via INTRAVENOUS
  Filled 2020-07-28: qty 79

## 2020-07-28 MED ORDER — PALONOSETRON HCL INJECTION 0.25 MG/5ML
0.2500 mg | Freq: Once | INTRAVENOUS | Status: AC
  Administered 2020-07-28: 0.25 mg via INTRAVENOUS

## 2020-07-28 MED ORDER — SODIUM CHLORIDE 0.9% FLUSH
10.0000 mL | Freq: Once | INTRAVENOUS | Status: AC
  Administered 2020-07-28: 10 mL
  Filled 2020-07-28: qty 10

## 2020-07-28 MED ORDER — PALONOSETRON HCL INJECTION 0.25 MG/5ML
INTRAVENOUS | Status: AC
  Filled 2020-07-28: qty 5

## 2020-07-28 MED ORDER — SODIUM CHLORIDE 0.9 % IV SOLN
10.0000 mg | Freq: Once | INTRAVENOUS | Status: AC
  Administered 2020-07-28: 10 mg via INTRAVENOUS
  Filled 2020-07-28: qty 10

## 2020-07-28 MED ORDER — HEPARIN SOD (PORK) LOCK FLUSH 100 UNIT/ML IV SOLN
500.0000 [IU] | Freq: Once | INTRAVENOUS | Status: DC | PRN
  Filled 2020-07-28: qty 5

## 2020-07-28 MED ORDER — SODIUM CHLORIDE 0.9 % IV SOLN
70.0000 mg/m2 | Freq: Once | INTRAVENOUS | Status: AC
  Administered 2020-07-28: 116.1 mg via INTRAVENOUS
  Filled 2020-07-28: qty 27

## 2020-07-28 MED ORDER — SODIUM CHLORIDE 0.9 % IV SOLN
Freq: Once | INTRAVENOUS | Status: AC
  Filled 2020-07-28: qty 250

## 2020-07-28 MED ORDER — SODIUM CHLORIDE 0.9 % IV SOLN
400.0000 mg/m2 | Freq: Once | INTRAVENOUS | Status: AC
  Administered 2020-07-28: 660 mg via INTRAVENOUS
  Filled 2020-07-28: qty 33

## 2020-07-28 NOTE — Patient Instructions (Signed)
[facility] ONCOLOGY   Discharge Instructions: Thank you for choosing [facility] to provide your oncology and hematology care.   If you have a lab appointment with the [facility], please go directly to the [facility] and check in at the registration area.   Wear comfortable clothing and clothing appropriate for easy access to any Portacath or PICC line.   We strive to give you quality time with your provider. You may need to reschedule your appointment if you arrive late (15 or more minutes).  Arriving late affects you and other patients whose appointments are after yours.  Also, if you miss three or more appointments without notifying the office, you may be dismissed from the clinic at the provider's discretion.      For prescription refill requests, have your pharmacy contact our office and allow 72 hours for refills to be completed.    Today you received the following chemotherapy and/or immunotherapy agents: Liposomal Irinotecan / Leucovorin / 5-FU      To help prevent nausea and vomiting after your treatment, we encourage you to take your nausea medication as directed.  BELOW ARE SYMPTOMS THAT SHOULD BE REPORTED IMMEDIATELY: *FEVER GREATER THAN 100.4 F (38 C) OR HIGHER *CHILLS OR SWEATING *NAUSEA AND VOMITING THAT IS NOT CONTROLLED WITH YOUR NAUSEA MEDICATION *UNUSUAL SHORTNESS OF BREATH *UNUSUAL BRUISING OR BLEEDING *URINARY PROBLEMS (pain or burning when urinating, or frequent urination) *BOWEL PROBLEMS (unusual diarrhea, constipation, pain near the anus) TENDERNESS IN MOUTH AND THROAT WITH OR WITHOUT PRESENCE OF ULCERS (sore throat, sores in mouth, or a toothache) UNUSUAL RASH, SWELLING OR PAIN  UNUSUAL VAGINAL DISCHARGE OR ITCHING   Items with * indicate a potential emergency and should be followed up as soon as possible or go to the Emergency Department if any problems should occur.  Please show the CHEMOTHERAPY ALERT CARD or  IMMUNOTHERAPY ALERT CARD at check-in to the Emergency Department and triage nurse.  Should you have questions after your visit or need to cancel or reschedule your appointment, please contact [facility]  Dept: [phone]  and follow the prompts.  Office hours are 8:00 a.m. to 4:30 p.m. Monday - Friday. Please note that voicemails left after 4:00 p.m. may not be returned until the following business day.  We are closed weekends and major holidays. You have access to a nurse at all times for urgent questions. Please call the main number to the clinic Dept: [phone] and follow the prompts.   For any non-urgent questions, you may also contact your provider using MyChart. We now offer e-Visits for anyone 46 and older to request care online for non-urgent symptoms. For details visit [email or web address].   Also download the MyChart app! Go to the app store, search "MyChart", open the app, select [facility], and log in with your MyChart username and password.  Due to Covid, a mask is required upon entering the hospital/clinic. If you do not have a mask, one will be given to you upon arrival. For doctor visits, patients may have 1 support person aged 34 or older with them. For treatment visits, patients cannot have anyone with them due to current Covid guidelines and our immunocompromised population.

## 2020-07-29 ENCOUNTER — Other Ambulatory Visit: Payer: Self-pay | Admitting: Internal Medicine

## 2020-07-30 ENCOUNTER — Other Ambulatory Visit: Payer: Self-pay

## 2020-07-30 ENCOUNTER — Inpatient Hospital Stay: Payer: Medicare Other

## 2020-07-30 VITALS — BP 126/72 | HR 73 | Temp 99.4°F | Resp 16

## 2020-07-30 DIAGNOSIS — Z452 Encounter for adjustment and management of vascular access device: Secondary | ICD-10-CM | POA: Diagnosis not present

## 2020-07-30 DIAGNOSIS — C252 Malignant neoplasm of tail of pancreas: Secondary | ICD-10-CM

## 2020-07-30 DIAGNOSIS — Z5111 Encounter for antineoplastic chemotherapy: Secondary | ICD-10-CM | POA: Diagnosis not present

## 2020-07-30 DIAGNOSIS — Z7189 Other specified counseling: Secondary | ICD-10-CM

## 2020-07-30 DIAGNOSIS — C787 Secondary malignant neoplasm of liver and intrahepatic bile duct: Secondary | ICD-10-CM | POA: Diagnosis not present

## 2020-07-30 MED ORDER — SODIUM CHLORIDE 0.9% FLUSH
10.0000 mL | INTRAVENOUS | Status: DC | PRN
  Administered 2020-07-30: 10 mL
  Filled 2020-07-30: qty 10

## 2020-07-30 MED ORDER — HEPARIN SOD (PORK) LOCK FLUSH 100 UNIT/ML IV SOLN
500.0000 [IU] | Freq: Once | INTRAVENOUS | Status: AC | PRN
  Administered 2020-07-30: 500 [IU]
  Filled 2020-07-30: qty 5

## 2020-07-30 NOTE — Patient Instructions (Signed)

## 2020-08-02 DIAGNOSIS — C252 Malignant neoplasm of tail of pancreas: Secondary | ICD-10-CM | POA: Diagnosis not present

## 2020-08-04 ENCOUNTER — Other Ambulatory Visit: Payer: Self-pay | Admitting: Internal Medicine

## 2020-08-10 NOTE — Progress Notes (Signed)
[place]   [phone] Fax:([phone]   Clinic Follow up Note   Patient Care Team: Sid Falcon, MD as PCP - General (Internal Medicine) Forrest Moron, [place] (Optometry) Jonnie Finner, RN (Inactive) as Oncology Nurse Navigator Truitt Merle, MD as Consulting Physician (Oncology) Gwyndolyn Kaufman, RN (Inactive) as Registered Nurse  Date of Service:  08/11/2020  CHIEF COMPLAINT: F/u Pancreatic cancer with liver metastasis   SUMMARY OF ONCOLOGIC HISTORY: Oncology History Overview Note  Cancer Staging Pancreatic cancer [facility]) Staging form: Exocrine Pancreas, AJCC 8th Edition - Clinical: Stage IV (cT1c, cN0, pM1) - Signed by Truitt Merle, MD on 02/26/2020 Total positive nodes: 0    Pancreatic cancer ([place])  01/13/2020 Imaging   US Abdomen 01/13/20  IMPRESSION: 1. Suggestion of 3 vascular hypoechoic mass within the liver measuring up to 2.5 cm. 2. Suggestion of a hypoechoic mass within the tail of the pancreas that is not well visualized. 3. Findings concerning for malignancy, please see separately dictated CT abdomen pelvis 01/13/2020.   01/13/2020 Imaging   CT AP 01/13/20  IMPRESSION: 1. Hypoenhancing 2.0 cm in long axis mass in the pancreatic tail compatible with pancreatic adenocarcinoma. The mass abuts the posterior gastric wall but without definite gastric invasion. The splenic vein appears narrowed/attenuated compared to prior exams and the lesion abuts the margin of the splenic artery. 2. Hypoenhancing hepatic masses are new compared to 11/22/2017 and are highly suspicious for metastatic lesions. 3. Questionable 0.8 by 0.8 cm omental tumor nodule versus dense diverticulum above the transverse colon. 4. Other imaging findings of potential clinical significance: Mild distal esophageal wall thickening, query esophagitis. Stable left adrenal adenoma. Sigmoid colon diverticulosis. Lumbar and thoracic spondylosis and degenerative disc  disease contributing to multilevel impingement. 5. Aortic atherosclerosis. Aortic Atherosclerosis (ICD10-I70.0).   01/21/2020 Initial Diagnosis   Pancreatic cancer ([place])   01/28/2020 Initial Biopsy   FINAL MICROSCOPIC DIAGNOSIS:   A. LIVER, NEEDLE CORE BIOPSY:  - Adenocarcinoma.   COMMENT:   Immunohistochemistry for CK7 is positive.  CDX-2 demonstrates weak to  moderate positive staining.  TTF-1 and PAX 8 demonstrate weak, likely  nonspecific staining.  CK20, GATA-3 and ER are negative.  The  morphologic and immunophenotypic characteristics are compatible with the  clinical impression of a primary pancreatic cancer.  Radiologic  correlation is encouraged.  If applicable, there is likely sufficient  tissue for ancillary studies (Block A2).  Dr. Vic Ripper reviewed the  case.    02/26/2020 - 05/19/2020 Chemotherapy   First-line Gemcitabine and Abraxane every 2 weeks starting 02/26/20. D/c after 05/19/20 due to disease progression in liver and neuropathy     02/26/2020 Cancer Staging   Staging form: Exocrine Pancreas, AJCC 8th Edition - Clinical: Stage IV (cT1c, cN0, pM1) - Signed by Truitt Merle, MD on 02/26/2020  Total positive nodes: 0    03/08/2020 Genetic Testing   Negative hereditary cancer genetic testing: no pathogenic variants detected in [facility] Common Hereditary Cancers Panel with preliminary evidence pancreatic cancer genes.  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 The report date is March 08, 2020.    The Common Hereditary Cancers Panel with preliminary evidence pancreatic cancer genes offered by [facility] includes sequencing and/or deletion duplication testing of the following 49 genes: APC, ATM, AXIN2, BARD1, BMPR1A, BRCA1, BRCA2, BRIP1, CDH1, CDK4, CDKN2A (p14ARF), CDKN2A (p16INK4a), CHEK2, CTNNA1, DICER1, EPCAM (Deletion/duplication testing only), GREM1 (promoter region deletion/duplication testing only), FANCC, GREM1, HOXB13, KIT, MEN1,  MLH1, MSH2, MSH3, MSH6, MUTYH,  NBN, NF1, NHTL1, PALB2, PALLD, PDGFRA, PMS2, POLD1, POLE, PTEN, RAD50, RAD51C, RAD51D, SDHA, SDHB, SDHC, SDHD, SMAD4, SMARCA4. STK11, TP53, TSC1, TSC2, and VHL.  The following genes were evaluated for sequence changes only: SDHA and HOXB13 c.251G>A variant only.es only: SDHA and HOXB13 c.251G>A variant only.   05/03/2020 Imaging   CT AP  IMPRESSION: 1. Interval decrease in size of a hypodense mass of the central pancreatic tail, consistent with treatment response of primary pancreatic malignancy. 2. Numerous low-attenuation liver lesions are seen, increased in size and number compared to prior examination. Findings are consistent with worsened hepatic metastatic disease. 3. A previously queried omental nodule adjacent to the transverse colon is more clearly a prominent diverticulum on current examination.   Aortic Atherosclerosis (ICD10-I70.0).   06/02/2020 -  Chemotherapy   Second-line FOLFIRI q2weeks starting 06/02/20       CURRENT THERAPY:  Second-line FOLFIRI q2weeks starting 06/02/20.   INTERVAL HISTORY: Amilya Haver is here for a follow up.  She is doing well overall, no pain or other discomfort She has good appetite and mild fatigue  Neuropathy is stable, she has tingling and sometime she drops small things  Weight is stable   All other systems were reviewed with the patient and are negative.  MEDICAL HISTORY:  Past Medical History:  Diagnosis Date   Acute medial meniscus tear    right knee   Anxiety    Asthma    Cancer ([place])    Cervical spondylosis    Chronic serous otitis media    Constipation    COPD (chronic obstructive pulmonary disease) (HCC)    Depression    followed by [name]; no longer of depakote, seroquel   Diabetes mellitus without complication ([place])    Dysuria 12/27/2009   Qualifier: Diagnosis of  By: [name] MD, Amanjot     Foot drop    GERD (gastroesophageal reflux disease)    Heart murmur    Hyperlipidemia     Hypertension    Low back pain    Obesity    Paraumbilical hernia    Pre-diabetes     SURGICAL HISTORY: Past Surgical History:  Procedure Laterality Date   ABDOMINAL HYSTERECTOMY     ANTERIOR CERVICAL DECOMP/DISCECTOMY FUSION N/A 10/05/2014   Procedure: ACDF - C5-C6 - C6-C7;  Surgeon: Karie Chimera, MD;  Location: [place] [facility];  Service: Neurosurgery;  Laterality: N/A;  ACDF - C5-C6 - C6-C7   CHOLECYSTECTOMY     COLONOSCOPY     FOOT SURGERY     HERNIA REPAIR  1999   IR IMAGING GUIDED PORT INSERTION  02/25/2020   IR US GUIDANCE  01/28/2020   KNEE ARTHROSCOPY WITH MEDIAL MENISECTOMY Right 06/14/2016   Procedure: RIGHT KNEE ARTHROSCOPY WITH PARTIAL MEDIAL MENISCECTOMY, SUBCHONDROPLASTY;  Surgeon: Leandrew Koyanagi, MD;  Location: [place];  Service: Orthopedics;  Laterality: Right;   KNEE ARTHROSCOPY WITH SUBCHONDROPLASTY Right 06/14/2016   Procedure: KNEE ARTHROSCOPY WITH SUBCHONDROPLASTY;  Surgeon: Leandrew Koyanagi, MD;  Location: [facility];  Service: Orthopedics;  Laterality: Right;   RADIOACTIVE SEED GUIDED EXCISIONAL BREAST BIOPSY Left 06/23/2014   Procedure: RADIOACTIVE SEED GUIDED EXCISIONAL BREAST BIOPSY;  Surgeon: Stark Klein, MD;  Location: [facility];  Service: General;  Laterality: Left;    I have reviewed the social history and family history with the patient and they are unchanged from previous note.  ALLERGIES:  has No Known Allergies.  MEDICATIONS:  Current Outpatient Medications  Medication Sig Dispense Refill  albuterol (PROVENTIL) (2.5 MG/3ML) 0.083% nebulizer solution Take 3 mLs (2.5 mg total) by nebulization every 6 (six) hours as needed for wheezing. 1080 mL 3   albuterol (VENTOLIN HFA) 108 (90 Base) MCG/ACT inhaler INHALE 1 TO 2 PUFFS INTO THE LUNGS EVERY 6 HOURS AS NEEDED FOR WHEEZING OR SHORTNESS OF BREATH 54 g 1   alendronate (FOSAMAX) 70 MG tablet TAKE 1 TABLET BY MOUTH EVERY 7 DAYS. TAKE WITH A FULL GLASS OF WATER ON  AN EMPTY STOMACH. 12 tablet 3   atorvastatin (LIPITOR) 40 MG tablet TAKE 1 TABLET BY MOUTH EVERY DAY 90 tablet 3   benazepril (LOTENSIN) 20 MG tablet TAKE 1 TABLET(20 MG) BY MOUTH DAILY 90 tablet 3   calcium carbonate (OS-CAL) 600 MG TABS tablet Take by mouth.     celecoxib (CELEBREX) 100 MG capsule TAKE 1 CAPSULE BY MOUTH  TWICE DAILY 180 capsule 3   cholecalciferol (VITAMIN D) 1000 units tablet Take 2,000 Units by mouth daily.     diclofenac Sodium (VOLTAREN) 1 % GEL Apply 2 g topically 4 (four) times daily. 100 g 2   famotidine (PEPCID) 40 MG tablet TAKE 1/2 TABLET(20 MG) BY MOUTH TWICE DAILY 30 tablet 3   fluticasone (FLONASE) 50 MCG/ACT nasal spray Place 1 spray into both nostrils daily. 16 g 3   Fluticasone-Salmeterol (ADVAIR DISKUS) 100-50 MCG/DOSE AEPB Inhale 1 puff into the lungs 2 (two) times daily. 60 each 11   furosemide (LASIX) 20 MG tablet Take 1 tablet by mouth once daily on day of chemo and while 5FU infusing, then take 1/2 tab (10 mg) by mouth daily 30 tablet 1   glipiZIDE (GLUCOTROL XL) 2.5 MG 24 hr tablet TAKE 1 TABLET(2.5 MG) BY MOUTH DAILY WITH BREAKFAST 30 tablet 2   glucose blood (ONETOUCH VERIO) test strip Use as instructed 100 each 12   hydrochlorothiazide (MICROZIDE) 12.5 MG capsule TAKE 1 CAPSULE(12.5 MG) BY MOUTH DAILY 90 capsule 1   HYDROcodone-acetaminophen (NORCO/VICODIN) 5-325 MG tablet Take 1 tablet by mouth 2 (two) times daily as needed for severe pain. 60 tablet 0   ipratropium (ATROVENT) 0.02 % nebulizer solution Take 0.5 mg by nebulization every 6 (six) hours as needed for wheezing or shortness of breath.     Lancets (ONETOUCH ULTRASOFT) lancets Use as instructed 100 each 12   latanoprost (XALATAN) 0.005 % ophthalmic solution PLACE 1 DROP INTO BOTH EYES AT BEDTIME  0   loperamide (LOPERAMIDE A-D) 2 MG tablet Take 1 tablet (2 mg total) by mouth 2 (two) times daily as needed for diarrhea or loose stools. 6 tablet 0   loratadine (CLARITIN) 10 MG tablet Take 1  tablet (10 mg total) by mouth daily. 30 tablet 11   magic mouthwash w/lidocaine SOLN Take 5 mLs by mouth 4 (four) times daily as needed for mouth pain. 480 mL 2   meclizine (ANTIVERT) 12.5 MG tablet TAKE 1 TABLET(12.5 MG) BY MOUTH TWICE DAILY AS NEEDED FOR DIZZINESS 15 tablet 3   potassium chloride SA (KLOR-CON) 20 MEQ tablet TAKE 1 TABLET(20 MEQ) BY MOUTH THREE TIMES DAILY 90 tablet 0   prochlorperazine (COMPAZINE) 10 MG tablet TAKE 1 TABLET(10 MG) BY MOUTH EVERY 6 HOURS AS NEEDED FOR NAUSEA OR VOMITING 30 tablet 3   risperiDONE (RISPERDAL) 0.25 MG tablet Take 0.25 mg by mouth daily as needed.     sertraline (ZOLOFT) 50 MG tablet Take 50 mg by mouth daily.     traZODone (DESYREL) 50 MG tablet Take 25-50 mg by mouth at  bedtime as needed for sleep.      triamcinolone cream (KENALOG) 0.1 % apply to affected area twice a day 454 g 1   No current facility-administered medications for this visit.   Facility-Administered Medications Ordered in Other Visits  Medication Dose Route Frequency Provider Last Rate Last Admin   fluorouracil (ADRUCIL) 3,950 mg in sodium chloride 0.9 % 71 mL chemo infusion  2,400 mg/m2 (Treatment Plan Recorded) Intravenous 1 day or 1 dose Truitt Merle, MD       heparin lock flush 100 unit/mL  500 Units Intracatheter Once PRN Truitt Merle, MD       irinotecan LIPOSOME (ONIVYDE) 116.1 mg in sodium chloride 0.9 % 500 mL chemo infusion  70 mg/m2 (Treatment Plan Recorded) Intravenous Once Truitt Merle, MD 351 mL/hr at 08/11/20 1121 116.1 mg at 08/11/20 1121   leucovorin 660 mg in sodium chloride 0.9 % 250 mL infusion  400 mg/m2 (Treatment Plan Recorded) Intravenous Once Truitt Merle, MD 566 mL/hr at 08/11/20 1123 660 mg at 08/11/20 1123   sodium chloride flush (NS) 0.9 % injection 10 mL  10 mL Intracatheter PRN Truitt Merle, MD        PHYSICAL EXAMINATION: ECOG PERFORMANCE STATUS: 1 - Symptomatic but completely ambulatory  Vitals:   08/11/20 0954  BP: 140/73  Pulse: 71  Resp: 18  Temp:  98.7 F (37.1 C)  SpO2: 100%    Filed Weights   08/11/20 0954  Weight: 154 lb 11.2 oz (70.2 kg)     GENERAL:alert, no distress and comfortable SKIN: skin color, texture, turgor are normal, no rashes or significant lesions EYES: normal, Conjunctiva are pink and non-injected, sclera clear  ABDOMEN:abdomen soft, non-tender and normal bowel sounds Musculoskeletal:no cyanosis of digits and no clubbing  NEURO: alert & oriented x 3 with fluent speech, no focal motor/sensory deficits  LABORATORY DATA:  I have reviewed the data as listed CBC Latest Ref Rng & Units 08/11/2020 07/28/2020 07/14/2020  WBC 4.0 - 10.5 K/uL 4.1 3.7(L) 4.1  Hemoglobin 12.0 - 15.0 g/dL 11.8(L) 12.1 12.2  Hematocrit 36.0 - 46.0 % 35.9(L) 37.1 37.4  Platelets 150 - 400 K/uL 156 126(L) 153     CMP Latest Ref Rng & Units 08/11/2020 07/28/2020 07/14/2020  Glucose 70 - 99 mg/dL 257(H) 217(H) 246(H)  BUN 8 - 23 mg/dL '9 8 11  ' Creatinine 0.44 - 1.00 mg/dL 0.79 0.81 0.86  Sodium 135 - 145 mmol/L 140 140 139  Potassium 3.5 - 5.1 mmol/L 3.8 4.1 3.8  Chloride 98 - 111 mmol/L 103 104 100  CO2 22 - 32 mmol/L '31 30 30  ' Calcium 8.9 - 10.3 mg/dL 9.2 9.5 9.1  Total Protein 6.5 - 8.1 g/dL 6.4(L) 6.4(L) 6.4(L)  Total Bilirubin 0.3 - 1.2 mg/dL 0.5 0.6 0.6  Alkaline Phos 38 - 126 U/L 174(H) 213(H) 236(H)  AST 15 - 41 U/L 34 38 34  ALT 0 - 44 U/L '23 28 24      ' RADIOGRAPHIC STUDIES: I have personally reviewed the radiological images as listed and agreed with the findings in the report. No results found.   ASSESSMENT & PLAN:  Nadalee Neiswender is a 70 y.o. female with    1. Pancreatic cancer with Liver metastasis, stage IV  -Her CT AP from 01/13/20 showed 2cm mass on tail of pancreas. The mass abuts the posterior gastric wall but without definite gastric invasion. Scan also shows multiple (at least 5) hepatic masses highly suspicious for metastatic lesions and Questionable 0.8 by 0.8  cm omental tumor nodule versus dense  diverticulum above the transverse colon. -Her liver biopsy from 01/28/20 showed adenocarcinoma from pancreatic primary. Her 02/11/20 CT Chest showed no metastasis  -I started her on first line chemo with gemcitabine and Abraxane every 2 weeks beginning 02/26/20. D/c after 05/19/20 due to disease progression in liver and chemo related neuropathy. -she is now on second-line FOLFIRI q2weeks starting 06/02/20. she is tolerating very well -will repeat staging CT before next visit in 2 weeks   2. Abdominal Pain, low appetite, weight loss, Transmanitis -She had abdominal pain for the past month before cancer diagnosis, 5/10. She is on Norco 5-372m BID as needed. Her pain has improved since chemo and not mentioned lately.  -She had sudden loss of appetite and weight loss since October 2021. She has overall lost 10 pounds. she has gained some weight back lately  -On Chemo her appetite has been able to increase and eating more.   -Weight mostly stable given fluctuations in leg swelling.   3. Comorbidities: Asthma, Borderline Diabetic, GERD, Heart murmur, HLD, HTN, arthritis -On medications, including multiple diuretics. -Managed by PCP.    4. Social support -She lives alone in 1 level house and she is retired. She has 1 adult son who lives in [place] She has a twin and 3 other sisters who live in [place] and other siblings and relative in [place]  -Her son will help her at home  -She has approved for grant to help with medication. Her sister will notify me of the type of grant.    6. Goal of care discussion -The patient understands the goal of care is palliative. -I recommend DNR/DNI, she will think about it    7. Hypokalemia  -She is on Lasix and dieretics for her LE edema and heart.  -continue oral K   8. Genetic testing from 02/19/20 was negative for pathogenetic mutation with VUS of gene NF1        PLAN:  -Lab reviewed, adequate for treatment, she is clinically doing well, will proceed to  cycle 6 FOLFIRI today -Follow-up in 2 weeks with chemo, with restaging CT abdomen pelvis a few days before    No problem-specific Assessment & Plan notes found for this encounter.   No orders of the defined types were placed in this encounter.  All questions were answered. The patient knows to call the clinic with any problems, questions or concerns. No barriers to learning was detected. The total time spent in the appointment was 30 minutes.     YTruitt Merle MD 08/11/2020

## 2020-08-11 ENCOUNTER — Other Ambulatory Visit: Payer: Self-pay

## 2020-08-11 ENCOUNTER — Inpatient Hospital Stay (HOSPITAL_BASED_OUTPATIENT_CLINIC_OR_DEPARTMENT_OTHER): Payer: Medicare Other | Admitting: Hematology

## 2020-08-11 ENCOUNTER — Inpatient Hospital Stay: Payer: Medicare Other

## 2020-08-11 ENCOUNTER — Encounter: Payer: Self-pay | Admitting: Hematology

## 2020-08-11 ENCOUNTER — Telehealth: Payer: Self-pay | Admitting: Hematology

## 2020-08-11 VITALS — BP 140/73 | HR 71 | Temp 98.7°F | Resp 18 | Ht 60.0 in | Wt 154.7 lb

## 2020-08-11 DIAGNOSIS — Z95828 Presence of other vascular implants and grafts: Secondary | ICD-10-CM

## 2020-08-11 DIAGNOSIS — Z7189 Other specified counseling: Secondary | ICD-10-CM

## 2020-08-11 DIAGNOSIS — C252 Malignant neoplasm of tail of pancreas: Secondary | ICD-10-CM

## 2020-08-11 DIAGNOSIS — Z452 Encounter for adjustment and management of vascular access device: Secondary | ICD-10-CM | POA: Diagnosis not present

## 2020-08-11 DIAGNOSIS — C787 Secondary malignant neoplasm of liver and intrahepatic bile duct: Secondary | ICD-10-CM | POA: Diagnosis not present

## 2020-08-11 DIAGNOSIS — Z5111 Encounter for antineoplastic chemotherapy: Secondary | ICD-10-CM | POA: Diagnosis not present

## 2020-08-11 LAB — CMP (CANCER CENTER ONLY)
ALT: 23 U/L (ref 0–44)
AST: 34 U/L (ref 15–41)
Albumin: 2.8 g/dL — ABNORMAL LOW (ref 3.5–5.0)
Alkaline Phosphatase: 174 U/L — ABNORMAL HIGH (ref 38–126)
Anion gap: 6 (ref 5–15)
BUN: 9 mg/dL (ref 8–23)
CO2: 31 mmol/L (ref 22–32)
Calcium: 9.2 mg/dL (ref 8.9–10.3)
Chloride: 103 mmol/L (ref 98–111)
Creatinine: 0.79 mg/dL (ref 0.44–1.00)
GFR, Estimated: >60 mL/min (ref >60)
Glucose, Bld: 257 mg/dL — ABNORMAL HIGH (ref 70–99)
Potassium: 3.8 mmol/L (ref 3.5–5.1)
Sodium: 140 mmol/L (ref 135–145)
Total Bilirubin: 0.5 mg/dL (ref 0.3–1.2)
Total Protein: 6.4 g/dL — ABNORMAL LOW (ref 6.5–8.1)

## 2020-08-11 LAB — CBC WITH DIFFERENTIAL (CANCER CENTER ONLY)
Abs Immature Granulocytes: 0.01 10*3/uL (ref 0.00–0.07)
Basophils Absolute: 0.1 10*3/uL (ref 0.0–0.1)
Basophils Relative: 2 %
Eosinophils Absolute: 0.3 10*3/uL (ref 0.0–0.5)
Eosinophils Relative: 7 %
HCT: 35.9 % — ABNORMAL LOW (ref 36.0–46.0)
Hemoglobin: 11.8 g/dL — ABNORMAL LOW (ref 12.0–15.0)
Immature Granulocytes: 0 %
Lymphocytes Relative: 12 %
Lymphs Abs: 0.5 10*3/uL — ABNORMAL LOW (ref 0.7–4.0)
MCH: 29.2 pg (ref 26.0–34.0)
MCHC: 32.9 g/dL (ref 30.0–36.0)
MCV: 88.9 fL (ref 80.0–100.0)
Monocytes Absolute: 0.7 10*3/uL (ref 0.1–1.0)
Monocytes Relative: 16 %
Neutro Abs: 2.6 10*3/uL (ref 1.7–7.7)
Neutrophils Relative %: 63 %
Platelet Count: 156 10*3/uL (ref 150–400)
RBC: 4.04 MIL/uL (ref 3.87–5.11)
RDW: 17.8 % — ABNORMAL HIGH (ref 11.5–15.5)
WBC Count: 4.1 10*3/uL (ref 4.0–10.5)
nRBC: 0 % (ref 0.0–0.2)

## 2020-08-11 MED ORDER — SODIUM CHLORIDE 0.9 % IV SOLN
400.0000 mg/m2 | Freq: Once | INTRAVENOUS | Status: AC
  Administered 2020-08-11: 660 mg via INTRAVENOUS
  Filled 2020-08-11: qty 33

## 2020-08-11 MED ORDER — SODIUM CHLORIDE 0.9% FLUSH
10.0000 mL | Freq: Once | INTRAVENOUS | Status: AC
  Administered 2020-08-11: 10 mL
  Filled 2020-08-11: qty 10

## 2020-08-11 MED ORDER — SODIUM CHLORIDE 0.9 % IV SOLN
70.0000 mg/m2 | Freq: Once | INTRAVENOUS | Status: AC
  Administered 2020-08-11: 116.1 mg via INTRAVENOUS
  Filled 2020-08-11: qty 27

## 2020-08-11 MED ORDER — FLUOROURACIL CHEMO INJECTION 5 GM/100ML
2400.0000 mg/m2 | INTRAVENOUS | Status: DC
  Administered 2020-08-11: 3950 mg via INTRAVENOUS
  Filled 2020-08-11: qty 79

## 2020-08-11 MED ORDER — SODIUM CHLORIDE 0.9 % IV SOLN
10.0000 mg | Freq: Once | INTRAVENOUS | Status: AC
  Administered 2020-08-11: 10 mg via INTRAVENOUS
  Filled 2020-08-11: qty 10

## 2020-08-11 MED ORDER — PALONOSETRON HCL INJECTION 0.25 MG/5ML
INTRAVENOUS | Status: AC
  Filled 2020-08-11: qty 5

## 2020-08-11 MED ORDER — PALONOSETRON HCL INJECTION 0.25 MG/5ML
0.2500 mg | Freq: Once | INTRAVENOUS | Status: AC
  Administered 2020-08-11: 0.25 mg via INTRAVENOUS

## 2020-08-11 MED ORDER — SODIUM CHLORIDE 0.9 % IV SOLN
Freq: Once | INTRAVENOUS | Status: AC
  Filled 2020-08-11: qty 250

## 2020-08-11 NOTE — Telephone Encounter (Signed)
Scheduled follow-up appointment per 7/27 los. Patient is aware. 

## 2020-08-11 NOTE — Patient Instructions (Signed)
[facility] ONCOL[facility]  Discharge Instructions: Thank you for choosing [facility] to provide your oncology and hematology care.   If you have a lab appointment with the [facility], please go directly to the [facility] and check in at the registration area.   Wear comfortable clothing and clothing appropriate for easy access to any Portacath or PICC line.   We strive to give you quality time with your provider. You may need to reschedule your appointment if you arrive late (15 or more minutes).  Arriving late affects you and other patients whose appointments are after yours.  Also, if you miss three or more appointments without notifying the office, you may be dismissed from the clinic at the provider's discretion.      For prescription refill requests, have your pharmacy contact our office and allow 72 hours for refills to be completed.    Today you received the following chemotherapy and/or immunotherapy agents FOLFIRI (Irinotecan, Leucovorin, and 46hr Flurouracil).      To help prevent nausea and vomiting after your treatment, we encourage you to take your nausea medication as directed.  BELOW ARE SYMPTOMS THAT SHOULD BE REPORTED IMMEDIATELY: *FEVER GREATER THAN 100.4 F (38 C) OR HIGHER *CHILLS OR SWEATING *NAUSEA AND VOMITING THAT IS NOT CONTROLLED WITH YOUR NAUSEA MEDICATION *UNUSUAL SHORTNESS OF BREATH *UNUSUAL BRUISING OR BLEEDING *URINARY PROBLEMS (pain or burning when urinating, or frequent urination) *BOWEL PROBLEMS (unusual diarrhea, constipation, pain near the anus) TENDERNESS IN MOUTH AND THROAT WITH OR WITHOUT PRESENCE OF ULCERS (sore throat, sores in mouth, or a toothache) UNUSUAL RASH, SWELLING OR PAIN  UNUSUAL VAGINAL DISCHARGE OR ITCHING   Items with * indicate a potential emergency and should be followed up as soon as possible or go to the Emergency Department if any problems should occur.  Please show the CHEMOTHERAPY ALERT  CARD or IMMUNOTHERAPY ALERT CARD at check-in to the Emergency Department and triage nurse.  Should you have questions after your visit or need to cancel or reschedule your appointment, please contact [facility]  Dept: [phone]  and follow the prompts.  Office hours are 8:00 a.m. to 4:30 p.m. Monday - Friday. Please note that voicemails left after 4:00 p.m. may not be returned until the following business day.  We are closed weekends and major holidays. You have access to a nurse at all times for urgent questions. Please call the main number to the clinic Dept: [phone] and follow the prompts.   For any non-urgent questions, you may also contact your provider using MyChart. We now offer e-Visits for anyone 42 and older to request care online for non-urgent symptoms. For details visit [email or web address].   Also download the MyChart app! Go to the app store, search "MyChart", open the app, select [facility], and log in with your MyChart username and password.  Due to Covid, a mask is required upon entering the hospital/clinic. If you do not have a mask, one will be given to you upon arrival. For doctor visits, patients may have 1 support person aged 42 or older with them. For treatment visits, patients cannot have anyone with them due to current Covid guidelines and our immunocompromised population.

## 2020-08-13 ENCOUNTER — Other Ambulatory Visit: Payer: Self-pay

## 2020-08-13 ENCOUNTER — Inpatient Hospital Stay: Payer: Medicare Other

## 2020-08-13 VITALS — BP 129/65 | HR 74 | Temp 99.0°F | Resp 18

## 2020-08-13 DIAGNOSIS — C252 Malignant neoplasm of tail of pancreas: Secondary | ICD-10-CM

## 2020-08-13 DIAGNOSIS — Z452 Encounter for adjustment and management of vascular access device: Secondary | ICD-10-CM | POA: Diagnosis not present

## 2020-08-13 DIAGNOSIS — Z7189 Other specified counseling: Secondary | ICD-10-CM

## 2020-08-13 DIAGNOSIS — C787 Secondary malignant neoplasm of liver and intrahepatic bile duct: Secondary | ICD-10-CM | POA: Diagnosis not present

## 2020-08-13 DIAGNOSIS — Z5111 Encounter for antineoplastic chemotherapy: Secondary | ICD-10-CM | POA: Diagnosis not present

## 2020-08-13 MED ORDER — SODIUM CHLORIDE 0.9% FLUSH
10.0000 mL | INTRAVENOUS | Status: DC | PRN
  Administered 2020-08-13: 10 mL
  Filled 2020-08-13: qty 10

## 2020-08-13 MED ORDER — HEPARIN SOD (PORK) LOCK FLUSH 100 UNIT/ML IV SOLN
500.0000 [IU] | Freq: Once | INTRAVENOUS | Status: AC | PRN
  Administered 2020-08-13: 500 [IU]
  Filled 2020-08-13: qty 5

## 2020-08-16 ENCOUNTER — Encounter: Payer: Medicare Other | Admitting: Internal Medicine

## 2020-08-16 NOTE — Progress Notes (Deleted)
   CC: ***  HPI:  [name] is a 70 y.o.   Past Medical History:  Diagnosis Date   Acute medial meniscus tear    right knee   Anxiety    Asthma    Cancer ([place])    Cervical spondylosis    Chronic serous otitis media    Constipation    COPD (chronic obstructive pulmonary disease) ([place] [facility])    Depression    followed by [name]; no longer of depakote, seroquel   Diabetes mellitus without complication ([place])    Dysuria 12/27/2009   Qualifier: Diagnosis of  By: [name] MD, Amanjot     Foot drop    GERD (gastroesophageal reflux disease)    Heart murmur    Hyperlipidemia    Hypertension    Low back pain    Obesity    Paraumbilical hernia    Pre-diabetes    Review of Systems:  ***  Physical Exam:  There were no vitals filed for this visit. ***  Assessment & Plan:   See Encounters Tab for problem based charting.  Patient {GC/GE:[id number]::"discussed with","seen with"} Dr. {NAMES:[id number]::"Guilloud","Hoffman","Mullen","Narendra","Williams","Vincent"}

## 2020-08-19 ENCOUNTER — Other Ambulatory Visit: Payer: Self-pay | Admitting: Internal Medicine

## 2020-08-19 ENCOUNTER — Encounter: Payer: Medicare Other | Admitting: Internal Medicine

## 2020-08-24 ENCOUNTER — Encounter (HOSPITAL_COMMUNITY): Payer: Self-pay

## 2020-08-24 ENCOUNTER — Ambulatory Visit (HOSPITAL_COMMUNITY)
Admission: RE | Admit: 2020-08-24 | Discharge: 2020-08-24 | Disposition: A | Discharge status: Home / Self Care | Payer: Medicare Other | Source: Ambulatory Visit | Attending: Nurse Practitioner | Admitting: Nurse Practitioner

## 2020-08-24 ENCOUNTER — Other Ambulatory Visit: Payer: Self-pay

## 2020-08-24 DIAGNOSIS — K5730 Diverticulosis of large intestine without perforation or abscess without bleeding: Secondary | ICD-10-CM | POA: Diagnosis not present

## 2020-08-24 DIAGNOSIS — C787 Secondary malignant neoplasm of liver and intrahepatic bile duct: Secondary | ICD-10-CM | POA: Diagnosis not present

## 2020-08-24 DIAGNOSIS — I82C11 Acute embolism and thrombosis of right internal jugular vein: Secondary | ICD-10-CM | POA: Diagnosis not present

## 2020-08-24 DIAGNOSIS — I700 Atherosclerosis of aorta: Secondary | ICD-10-CM | POA: Diagnosis not present

## 2020-08-24 DIAGNOSIS — C252 Malignant neoplasm of tail of pancreas: Secondary | ICD-10-CM | POA: Diagnosis not present

## 2020-08-24 DIAGNOSIS — J439 Emphysema, unspecified: Secondary | ICD-10-CM | POA: Diagnosis not present

## 2020-08-24 DIAGNOSIS — N3289 Other specified disorders of bladder: Secondary | ICD-10-CM | POA: Diagnosis not present

## 2020-08-24 MED ORDER — HEPARIN SOD (PORK) LOCK FLUSH 100 UNIT/ML IV SOLN
INTRAVENOUS | Status: AC
  Filled 2020-08-24: qty 5

## 2020-08-24 MED ORDER — HEPARIN SOD (PORK) LOCK FLUSH 100 UNIT/ML IV SOLN: 500.0000 [IU] | Freq: Once | INTRAVENOUS | Status: DC

## 2020-08-24 MED ORDER — IOHEXOL 350 MG/ML SOLN
80.0000 mL | Freq: Once | INTRAVENOUS | Status: AC | PRN
  Administered 2020-08-24: 80 mL via INTRAVENOUS

## 2020-08-25 ENCOUNTER — Inpatient Hospital Stay: Payer: Medicare Other | Attending: Hematology | Admitting: Hematology

## 2020-08-25 ENCOUNTER — Encounter: Payer: Self-pay | Admitting: Hematology

## 2020-08-25 ENCOUNTER — Inpatient Hospital Stay: Payer: Medicare Other

## 2020-08-25 ENCOUNTER — Telehealth: Payer: Self-pay

## 2020-08-25 VITALS — BP 119/70 | HR 74 | Temp 98.6°F | Resp 19 | Ht 60.0 in | Wt 142.5 lb

## 2020-08-25 DIAGNOSIS — C252 Malignant neoplasm of tail of pancreas: Secondary | ICD-10-CM | POA: Diagnosis not present

## 2020-08-25 DIAGNOSIS — C787 Secondary malignant neoplasm of liver and intrahepatic bile duct: Secondary | ICD-10-CM | POA: Insufficient documentation

## 2020-08-25 DIAGNOSIS — Z5111 Encounter for antineoplastic chemotherapy: Secondary | ICD-10-CM | POA: Diagnosis not present

## 2020-08-25 DIAGNOSIS — Z95828 Presence of other vascular implants and grafts: Secondary | ICD-10-CM

## 2020-08-25 DIAGNOSIS — Z452 Encounter for adjustment and management of vascular access device: Secondary | ICD-10-CM | POA: Insufficient documentation

## 2020-08-25 DIAGNOSIS — Z7189 Other specified counseling: Secondary | ICD-10-CM

## 2020-08-25 LAB — CMP (CANCER CENTER ONLY)
ALT: 26 U/L (ref 0–44)
AST: 35 U/L (ref 15–41)
Albumin: 2.9 g/dL — ABNORMAL LOW (ref 3.5–5.0)
Alkaline Phosphatase: 161 U/L — ABNORMAL HIGH (ref 38–126)
Anion gap: 9 (ref 5–15)
BUN: 9 mg/dL (ref 8–23)
CO2: 27 mmol/L (ref 22–32)
Calcium: 9.5 mg/dL (ref 8.9–10.3)
Chloride: 104 mmol/L (ref 98–111)
Creatinine: 0.88 mg/dL (ref 0.44–1.00)
GFR, Estimated: >60 mL/min (ref >60)
Glucose, Bld: 142 mg/dL — ABNORMAL HIGH (ref 70–99)
Potassium: 3.8 mmol/L (ref 3.5–5.1)
Sodium: 140 mmol/L (ref 135–145)
Total Bilirubin: 0.5 mg/dL (ref 0.3–1.2)
Total Protein: 6.4 g/dL — ABNORMAL LOW (ref 6.5–8.1)

## 2020-08-25 LAB — CBC WITH DIFFERENTIAL (CANCER CENTER ONLY)
Abs Immature Granulocytes: 0.01 10*3/uL (ref 0.00–0.07)
Basophils Absolute: 0.1 10*3/uL (ref 0.0–0.1)
Basophils Relative: 1 %
Eosinophils Absolute: 0.4 10*3/uL (ref 0.0–0.5)
Eosinophils Relative: 8 %
HCT: 35.9 % — ABNORMAL LOW (ref 36.0–46.0)
Hemoglobin: 11.9 g/dL — ABNORMAL LOW (ref 12.0–15.0)
Immature Granulocytes: 0 %
Lymphocytes Relative: 9 %
Lymphs Abs: 0.4 10*3/uL — ABNORMAL LOW (ref 0.7–4.0)
MCH: 30 pg (ref 26.0–34.0)
MCHC: 33.1 g/dL (ref 30.0–36.0)
MCV: 90.4 fL (ref 80.0–100.0)
Monocytes Absolute: 0.8 10*3/uL (ref 0.1–1.0)
Monocytes Relative: 15 %
Neutro Abs: 3.2 10*3/uL (ref 1.7–7.7)
Neutrophils Relative %: 67 %
Platelet Count: 142 10*3/uL — ABNORMAL LOW (ref 150–400)
RBC: 3.97 MIL/uL (ref 3.87–5.11)
RDW: 18.5 % — ABNORMAL HIGH (ref 11.5–15.5)
WBC Count: 4.9 10*3/uL (ref 4.0–10.5)
nRBC: 0 % (ref 0.0–0.2)

## 2020-08-25 MED ORDER — SODIUM CHLORIDE 0.9 % IV SOLN
70.0000 mg/m2 | Freq: Once | INTRAVENOUS | Status: AC
  Administered 2020-08-25: 116.1 mg via INTRAVENOUS
  Filled 2020-08-25: qty 27

## 2020-08-25 MED ORDER — FUROSEMIDE 20 MG PO TABS: ORAL_TABLET | ORAL | 1 refills | Status: DC

## 2020-08-25 MED ORDER — SODIUM CHLORIDE 0.9 % IV SOLN
400.0000 mg/m2 | Freq: Once | INTRAVENOUS | Status: AC
  Administered 2020-08-25: 660 mg via INTRAVENOUS
  Filled 2020-08-25: qty 33

## 2020-08-25 MED ORDER — SODIUM CHLORIDE 0.9 % IV SOLN
2400.0000 mg/m2 | INTRAVENOUS | Status: DC
  Administered 2020-08-25: 3950 mg via INTRAVENOUS
  Filled 2020-08-25: qty 79

## 2020-08-25 MED ORDER — GABAPENTIN 100 MG PO CAPS: 100.0000 mg | ORAL_CAPSULE | Freq: Three times a day (TID) | ORAL | 1 refills | Status: DC

## 2020-08-25 MED ORDER — PALONOSETRON HCL INJECTION 0.25 MG/5ML
INTRAVENOUS | Status: AC
  Filled 2020-08-25: qty 5

## 2020-08-25 MED ORDER — RIVAROXABAN (XARELTO) VTE STARTER PACK (15 & 20 MG): ORAL_TABLET | ORAL | 0 refills | Status: DC

## 2020-08-25 MED ORDER — SODIUM CHLORIDE 0.9 % IV SOLN
10.0000 mg | Freq: Once | INTRAVENOUS | Status: AC
  Administered 2020-08-25: 10 mg via INTRAVENOUS
  Filled 2020-08-25: qty 10

## 2020-08-25 MED ORDER — SODIUM CHLORIDE 0.9% FLUSH
10.0000 mL | Freq: Once | INTRAVENOUS | Status: AC
  Administered 2020-08-25: 10 mL
  Filled 2020-08-25: qty 10

## 2020-08-25 MED ORDER — SODIUM CHLORIDE 0.9% FLUSH
10.0000 mL | INTRAVENOUS | Status: DC | PRN
  Filled 2020-08-25: qty 10

## 2020-08-25 MED ORDER — SODIUM CHLORIDE 0.9 % IV SOLN
Freq: Once | INTRAVENOUS | Status: AC
  Filled 2020-08-25: qty 250

## 2020-08-25 MED ORDER — PALONOSETRON HCL INJECTION 0.25 MG/5ML
0.2500 mg | Freq: Once | INTRAVENOUS | Status: AC
  Administered 2020-08-25: 0.25 mg via INTRAVENOUS

## 2020-08-25 NOTE — Patient Instructions (Addendum)
[facility]  Discharge Instructions: Thank you for choosing [facility] to provide your oncology and hematology care.   If you have a lab appointment with the [facility], please go directly to the [facility] and check in at the registration area.   Wear comfortable clothing and clothing appropriate for easy access to any Portacath or PICC line.   We strive to give you quality time with your provider. You may need to reschedule your appointment if you arrive late (15 or more minutes).  Arriving late affects you and other patients whose appointments are after yours.  Also, if you miss three or more appointments without notifying the office, you may be dismissed from the clinic at the provider's discretion.      For prescription refill requests, have your pharmacy contact our office and allow 72 hours for refills to be completed.    Today you received the following chemotherapy and/or immunotherapy agents Irinotecan Liposomal, Leucovorin, 5FU      To help prevent nausea and vomiting after your treatment, we encourage you to take your nausea medication as directed.  BELOW ARE SYMPTOMS THAT SHOULD BE REPORTED IMMEDIATELY: *FEVER GREATER THAN 100.4 F (38 C) OR HIGHER *CHILLS OR SWEATING *NAUSEA AND VOMITING THAT IS NOT CONTROLLED WITH YOUR NAUSEA MEDICATION *UNUSUAL SHORTNESS OF BREATH *UNUSUAL BRUISING OR BLEEDING *URINARY PROBLEMS (pain or burning when urinating, or frequent urination) *BOWEL PROBLEMS (unusual diarrhea, constipation, pain near the anus) TENDERNESS IN MOUTH AND THROAT WITH OR WITHOUT PRESENCE OF ULCERS (sore throat, sores in mouth, or a toothache) UNUSUAL RASH, SWELLING OR PAIN  UNUSUAL VAGINAL DISCHARGE OR ITCHING   Items with * indicate a potential emergency and should be followed up as soon as possible or go to the Emergency Department if any problems should occur.  Please show the CHEMOTHERAPY ALERT CARD or IMMUNOTHERAPY  ALERT CARD at check-in to the Emergency Department and triage nurse.  Should you have questions after your visit or need to cancel or reschedule your appointment, please contact [facility]  Dept: [phone]  and follow the prompts.  Office hours are 8:00 a.m. to 4:30 p.m. Monday - Friday. Please note that voicemails left after 4:00 p.m. may not be returned until the following business day.  We are closed weekends and major holidays. You have access to a nurse at all times for urgent questions. Please call the main number to the clinic Dept: [phone] and follow the prompts.   For any non-urgent questions, you may also contact your provider using MyChart. We now offer e-Visits for anyone 31 and older to request care online for non-urgent symptoms. For details visit [email or web address].   Also download the MyChart app! Go to the app store, search "MyChart", open the app, select [facility], and log in with your MyChart username and password.  Due to Covid, a mask is required upon entering the hospital/clinic. If you do not have a mask, one will be given to you upon arrival. For doctor visits, patients may have 1 support person aged 32 or older with them. For treatment visits, patients cannot have anyone with them due to current Covid guidelines and our immunocompromised population.   The chemotherapy medication bag should finish at 46 hours, 96 hours, or 7 days. For example, if your pump is scheduled for 46 hours and it was put on at 4:00 p.m., it should finish at 2:00 p.m. the day it is scheduled to come off  regardless of your appointment time.     Estimated time to finish at 08/27/20 at .12:30   If the display on your pump reads "Low Volume" and it is beeping, take the batteries out of the pump and come to the cancer center for it to be taken off.   If the pump alarms go off prior to the pump reading "Low Volume" then call [phone] and someone can assist  you.  If the plunger comes out and the chemotherapy medication is leaking out, please use your home chemo spill kit to clean up the spill. Do NOT use paper towels or other household products.  If you have problems or questions regarding your pump, please call either [phone] (24 hours a day) or the cancer center Monday-Friday 8:00 a.m.- 4:30 p.m. at the clinic number and we will assist you. If you are unable to get assistance, then go to the nearest Emergency Department and ask the staff to contact the IV team for assistance.

## 2020-08-25 NOTE — Telephone Encounter (Signed)
This nurse received call report for CT results.  MD made aware of results.  No further questions or concerns at this time.

## 2020-08-25 NOTE — Progress Notes (Signed)
[place]   [phone] [phone]   Clinic Follow up Note   Patient Care Team: Sid Falcon, MD as PCP - General (Internal Medicine) Forrest Moron, [place] (Optometry) Jonnie Finner, RN (Inactive) as Oncology Nurse Navigator Truitt Merle, MD as Consulting Physician (Oncology) Gwyndolyn Kaufman, RN (Inactive) as Registered Nurse  Date of Service:  08/25/2020  CHIEF COMPLAINT: f/u of pancreatic cancer with liver metastasis  SUMMARY OF ONCOLOGIC HISTORY: Oncology History Overview Note  Cancer Staging Pancreatic cancer [facility]) Staging form: Exocrine Pancreas, AJCC 8th Edition - Clinical: Stage IV (cT1c, cN0, pM1) - Signed by Truitt Merle, MD on 02/26/2020 Total positive nodes: 0    Pancreatic cancer ([place])  01/13/2020 Imaging   US Abdomen 01/13/20  IMPRESSION: 1. Suggestion of 3 vascular hypoechoic mass within the liver measuring up to 2.5 cm. 2. Suggestion of a hypoechoic mass within the tail of the pancreas that is not well visualized. 3. Findings concerning for malignancy, please see separately dictated CT abdomen pelvis 01/13/2020.   01/13/2020 Imaging   CT AP 01/13/20  IMPRESSION: 1. Hypoenhancing 2.0 cm in long axis mass in the pancreatic tail compatible with pancreatic adenocarcinoma. The mass abuts the posterior gastric wall but without definite gastric invasion. The splenic vein appears narrowed/attenuated compared to prior exams and the lesion abuts the margin of the splenic artery. 2. Hypoenhancing hepatic masses are new compared to 11/22/2017 and are highly suspicious for metastatic lesions. 3. Questionable 0.8 by 0.8 cm omental tumor nodule versus dense diverticulum above the transverse colon. 4. Other imaging findings of potential clinical significance: Mild distal esophageal wall thickening, query esophagitis. Stable left adrenal adenoma. Sigmoid colon diverticulosis. Lumbar and thoracic spondylosis and degenerative disc  disease contributing to multilevel impingement. 5. Aortic atherosclerosis. Aortic Atherosclerosis (ICD10-I70.0).   01/21/2020 Initial Diagnosis   Pancreatic cancer ([place])   01/28/2020 Initial Biopsy   FINAL MICROSCOPIC DIAGNOSIS:   A. LIVER, NEEDLE CORE BIOPSY:  - Adenocarcinoma.   COMMENT:   Immunohistochemistry for CK7 is positive.  CDX-2 demonstrates weak to  moderate positive staining.  TTF-1 and PAX 8 demonstrate weak, likely  nonspecific staining.  CK20, GATA-3 and ER are negative.  The  morphologic and immunophenotypic characteristics are compatible with the  clinical impression of a primary pancreatic cancer.  Radiologic  correlation is encouraged.  If applicable, there is likely sufficient  tissue for ancillary studies (Block A2).  Dr. Vic Ripper reviewed the  case.    02/26/2020 - 05/19/2020 Chemotherapy   First-line Gemcitabine and Abraxane every 2 weeks starting 02/26/20. D/c after 05/19/20 due to disease progression in liver and neuropathy     02/26/2020 Cancer Staging   Staging form: Exocrine Pancreas, AJCC 8th Edition - Clinical: Stage IV (cT1c, cN0, pM1) - Signed by Truitt Merle, MD on 02/26/2020  Total positive nodes: 0    03/08/2020 Genetic Testing   Negative hereditary cancer genetic testing: no pathogenic variants detected in [facility] Common Hereditary Cancers Panel with preliminary evidence pancreatic cancer genes.  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 The report date is March 08, 2020.    The Common Hereditary Cancers Panel with preliminary evidence pancreatic cancer genes offered by [facility] includes sequencing and/or deletion duplication testing of the following 49 genes: APC, ATM, AXIN2, BARD1, BMPR1A, BRCA1, BRCA2, BRIP1, CDH1, CDK4, CDKN2A (p14ARF), CDKN2A (p16INK4a), CHEK2, CTNNA1, DICER1, EPCAM (Deletion/duplication testing only), GREM1 (promoter region deletion/duplication testing only), FANCC, GREM1, HOXB13, KIT, MEN1,  MLH1, MSH2, MSH3, MSH6, MUTYH,  NBN, NF1, NHTL1, PALB2, PALLD, PDGFRA, PMS2, POLD1, POLE, PTEN, RAD50, RAD51C, RAD51D, SDHA, SDHB, SDHC, SDHD, SMAD4, SMARCA4. STK11, TP53, TSC1, TSC2, and VHL.  The following genes were evaluated for sequence changes only: SDHA and HOXB13 c.251G>A variant only.es only: SDHA and HOXB13 c.251G>A variant only.   05/03/2020 Imaging   CT AP  IMPRESSION: 1. Interval decrease in size of a hypodense mass of the central pancreatic tail, consistent with treatment response of primary pancreatic malignancy. 2. Numerous low-attenuation liver lesions are seen, increased in size and number compared to prior examination. Findings are consistent with worsened hepatic metastatic disease. 3. A previously queried omental nodule adjacent to the transverse colon is more clearly a prominent diverticulum on current examination.   Aortic Atherosclerosis (ICD10-I70.0).   06/02/2020 -  Chemotherapy   Second-line FOLFIRI q2weeks starting 06/02/20       CURRENT THERAPY:  Second-line FOLFIRI q2weeks starting 06/02/20.   INTERVAL HISTORY:  Mariah Henderson is here for a follow up of metastatic pancreatic cancer. She was last seen by me on 08/11/20. She presents to the clinic with her sister today.   She is clinically stable, has been tolerating chemotherapy well.  Her main concern is neuropathy, which has been slightly worse lately.  She did had mild numbness and tingling in her fingers and toes even before chemo, likely secondary to diabetes.  She occasionally drop small things, no issue with balance or fall.  She has mild to moderate fatigue, able to function well at home.  No significant pain, nausea, diarrhea or other new symptoms.   All other systems were reviewed with the patient and are negative.  MEDICAL HISTORY:  Past Medical History:  Diagnosis Date   Acute medial meniscus tear    right knee   Anxiety    Asthma    Cancer ([place])    Cervical spondylosis    Chronic serous  otitis media    Constipation    COPD (chronic obstructive pulmonary disease) (HCC)    Depression    followed by [name]; no longer of depakote, seroquel   Diabetes mellitus without complication ([place])    Dysuria 12/27/2009   Qualifier: Diagnosis of  By: [name] MD, Amanjot     Foot drop    GERD (gastroesophageal reflux disease)    Heart murmur    Hyperlipidemia    Hypertension    Low back pain    Obesity    Paraumbilical hernia    Pre-diabetes     SURGICAL HISTORY: Past Surgical History:  Procedure Laterality Date   ABDOMINAL HYSTERECTOMY     ANTERIOR CERVICAL DECOMP/DISCECTOMY FUSION N/A 10/05/2014   Procedure: ACDF - C5-C6 - C6-C7;  Surgeon: Karie Chimera, MD;  Location: [place] [facility];  Service: Neurosurgery;  Laterality: N/A;  ACDF - C5-C6 - C6-C7   CHOLECYSTECTOMY     COLONOSCOPY     FOOT SURGERY     HERNIA REPAIR  1999   IR IMAGING GUIDED PORT INSERTION  02/25/2020   IR US GUIDANCE  01/28/2020   KNEE ARTHROSCOPY WITH MEDIAL MENISECTOMY Right 06/14/2016   Procedure: RIGHT KNEE ARTHROSCOPY WITH PARTIAL MEDIAL MENISCECTOMY, SUBCHONDROPLASTY;  Surgeon: Leandrew Koyanagi, MD;  Location: [place];  Service: Orthopedics;  Laterality: Right;   KNEE ARTHROSCOPY WITH SUBCHONDROPLASTY Right 06/14/2016   Procedure: KNEE ARTHROSCOPY WITH SUBCHONDROPLASTY;  Surgeon: Leandrew Koyanagi, MD;  Location: [place];  Service: Orthopedics;  Laterality: Right;   RADIOACTIVE SEED GUIDED EXCISIONAL BREAST BIOPSY Left 06/23/2014  Procedure: RADIOACTIVE SEED GUIDED EXCISIONAL BREAST BIOPSY;  Surgeon: Stark Klein, MD;  Location: [place];  Service: General;  Laterality: Left;    I have reviewed the social history and family history with the patient and they are unchanged from previous note.  ALLERGIES:  has No Known Allergies.  MEDICATIONS:  Current Outpatient Medications  Medication Sig Dispense Refill   gabapentin (NEURONTIN) 100 MG capsule Take 1 capsule  (100 mg total) by mouth 3 (three) times daily. 90 capsule 1   RIVAROXABAN (XARELTO) VTE STARTER PACK (15 & 20 MG) Follow package directions: Take one 58m tablet by mouth twice a day. On day 22, switch to one 231mtablet once a day. Take with food. 51 each 0   albuterol (PROVENTIL) (2.5 MG/3ML) 0.083% nebulizer solution Take 3 mLs (2.5 mg total) by nebulization every 6 (six) hours as needed for wheezing. 1080 mL 3   albuterol (VENTOLIN HFA) 108 (90 Base) MCG/ACT inhaler INHALE 1 TO 2 PUFFS INTO THE LUNGS EVERY 6 HOURS AS NEEDED FOR WHEEZING OR SHORTNESS OF BREATH 54 g 1   alendronate (FOSAMAX) 70 MG tablet TAKE 1 TABLET BY MOUTH EVERY 7 DAYS. TAKE WITH A FULL GLASS OF WATER ON AN EMPTY STOMACH. 12 tablet 3   atorvastatin (LIPITOR) 40 MG tablet TAKE 1 TABLET BY MOUTH EVERY DAY 90 tablet 3   benazepril (LOTENSIN) 20 MG tablet TAKE 1 TABLET(20 MG) BY MOUTH DAILY 90 tablet 3   calcium carbonate (OS-CAL) 600 MG TABS tablet Take by mouth.     celecoxib (CELEBREX) 100 MG capsule TAKE 1 CAPSULE BY MOUTH  TWICE DAILY 180 capsule 3   cholecalciferol (VITAMIN D) 1000 units tablet Take 2,000 Units by mouth daily.     diclofenac Sodium (VOLTAREN) 1 % GEL Apply 2 g topically 4 (four) times daily. 100 g 2   famotidine (PEPCID) 40 MG tablet TAKE 1/2 TABLET(20 MG) BY MOUTH TWICE DAILY 30 tablet 3   fluticasone (FLONASE) 50 MCG/ACT nasal spray Place 1 spray into both nostrils daily. 16 g 3   Fluticasone-Salmeterol (ADVAIR DISKUS) 100-50 MCG/DOSE AEPB Inhale 1 puff into the lungs 2 (two) times daily. 60 each 11   furosemide (LASIX) 20 MG tablet Take 1 tablet by mouth once daily on day of chemo and while 5FU infusing, then take 1/2 tab (10 mg) by mouth daily 30 tablet 1   glipiZIDE (GLUCOTROL XL) 2.5 MG 24 hr tablet TAKE 1 TABLET(2.5 MG) BY MOUTH DAILY WITH BREAKFAST 30 tablet 2   glucose blood (ONETOUCH VERIO) test strip Use as instructed 100 each 12   hydrochlorothiazide (MICROZIDE) 12.5 MG capsule TAKE 1  CAPSULE(12.5 MG) BY MOUTH DAILY 90 capsule 1   HYDROcodone-acetaminophen (NORCO/VICODIN) 5-325 MG tablet Take 1 tablet by mouth 2 (two) times daily as needed for severe pain. 60 tablet 0   ipratropium (ATROVENT) 0.02 % nebulizer solution Take 0.5 mg by nebulization every 6 (six) hours as needed for wheezing or shortness of breath.     Lancets (ONETOUCH ULTRASOFT) lancets Use as instructed 100 each 12   latanoprost (XALATAN) 0.005 % ophthalmic solution PLACE 1 DROP INTO BOTH EYES AT BEDTIME  0   loperamide (LOPERAMIDE A-D) 2 MG tablet Take 1 tablet (2 mg total) by mouth 2 (two) times daily as needed for diarrhea or loose stools. 6 tablet 0   loratadine (CLARITIN) 10 MG tablet Take 1 tablet (10 mg total) by mouth daily. 30 tablet 11   magic mouthwash w/lidocaine SOLN Take 5  mLs by mouth 4 (four) times daily as needed for mouth pain. 480 mL 2   meclizine (ANTIVERT) 12.5 MG tablet TAKE 1 TABLET(12.5 MG) BY MOUTH TWICE DAILY AS NEEDED FOR DIZZINESS 15 tablet 3   potassium chloride SA (KLOR-CON) 20 MEQ tablet TAKE 1 TABLET(20 MEQ) BY MOUTH THREE TIMES DAILY 90 tablet 0   prochlorperazine (COMPAZINE) 10 MG tablet TAKE 1 TABLET(10 MG) BY MOUTH EVERY 6 HOURS AS NEEDED FOR NAUSEA OR VOMITING 30 tablet 3   risperiDONE (RISPERDAL) 0.25 MG tablet Take 0.25 mg by mouth daily as needed.     sertraline (ZOLOFT) 50 MG tablet Take 50 mg by mouth daily.     traZODone (DESYREL) 50 MG tablet Take 25-50 mg by mouth at bedtime as needed for sleep.      triamcinolone cream (KENALOG) 0.1 % apply to affected area twice a day 454 g 1   No current facility-administered medications for this visit.   Facility-Administered Medications Ordered in Other Visits  Medication Dose Route Frequency Provider Last Rate Last Admin   heparin lock flush 100 unit/mL  500 Units Intracatheter Once PRN Truitt Merle, MD       sodium chloride flush (NS) 0.9 % injection 10 mL  10 mL Intracatheter PRN Truitt Merle, MD        PHYSICAL  EXAMINATION: ECOG PERFORMANCE STATUS: 1 - Symptomatic but completely ambulatory  Vitals:   08/25/20 0942  BP: 119/70  Pulse: 74  Resp: 19  Temp: 98.6 F (37 C)  SpO2: 99%   Wt Readings from Last 3 Encounters:  08/25/20 142 lb 8 oz (64.6 kg)  08/11/20 154 lb 11.2 oz (70.2 kg)  07/28/20 146 lb (66.2 kg)     GENERAL:alert, no distress and comfortable SKIN: skin color, texture, turgor are normal, no rashes or significant lesions EYES: normal, Conjunctiva are pink and non-injected, sclera clear NECK: supple, thyroid normal size, non-tender, without nodularity LYMPH:  no palpable lymphadenopathy in the cervical, axillary  LUNGS: clear to auscultation and percussion with normal breathing effort HEART: regular rate & rhythm and no murmurs and no lower extremity edema ABDOMEN:abdomen soft, non-tender and normal bowel sounds Musculoskeletal:no cyanosis of digits and no clubbing  NEURO: alert & oriented x 3 with fluent speech, no focal motor/sensory deficits  LABORATORY DATA:  I have reviewed the data as listed CBC Latest Ref Rng & Units 08/25/2020 08/11/2020 07/28/2020  WBC 4.0 - 10.5 K/uL 4.9 4.1 3.7(L)  Hemoglobin 12.0 - 15.0 g/dL 11.9(L) 11.8(L) 12.1  Hematocrit 36.0 - 46.0 % 35.9(L) 35.9(L) 37.1  Platelets 150 - 400 K/uL 142(L) 156 126(L)     CMP Latest Ref Rng & Units 08/25/2020 08/11/2020 07/28/2020  Glucose 70 - 99 mg/dL 142(H) 257(H) 217(H)  BUN 8 - 23 mg/dL _0 Creatinine 0.44 - 1.00 mg/dL 0.88 0.79 0.81  Sodium 135 - 145 mmol/L 140 140 140  Potassium 3.5 - 5.1 mmol/L 3.8 3.8 4.1  Chloride 98 - 111 mmol/L 104 103 104  CO2 22 - 32 mmol/L _1 Calcium 8.9 - 10.3 mg/dL 9.5 9.2 9.5  Total Protein 6.5 - 8.1 g/dL 6.4(L) 6.4(L) 6.4(L)  Total Bilirubin 0.3 - 1.2 mg/dL 0.5 0.5 0.6  Alkaline Phos 38 - 126 U/L 161(H) 174(H) 213(H)  AST 15 - 41 U/L 35 34 38  ALT 0 - 44 U/L _2 RADIOGRAPHIC STUDIES: I have personally reviewed the radiological images as listed  and agreed with  the findings in the report. CT CHEST ABDOMEN PELVIS W CONTRAST  Result Date: 08/25/2020 CLINICAL DATA:  Follow-up metastatic pancreatic carcinoma. Status post chemotherapy. Restaging. EXAM: CT CHEST, ABDOMEN, AND PELVIS WITH CONTRAST TECHNIQUE: Multidetector CT imaging of the chest, abdomen and pelvis was performed following the standard protocol during bolus administration of intravenous contrast. 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 There is no evidence of extension into the SVC. Mediastinum/Lymph Nodes: No masses or pathologically enlarged lymph nodes identified. Lungs/Pleura: Mild emphysema and bilateral pleural-parenchymal scarring is again seen. 2 mm pulmonary nodule in the lingula on image 62/4 remains stable. Previously seen tiny nodule in the posterolateral left lower lobe is no longer visualized. No new or enlarging pulmonary nodules or masses are identified. No evidence of pulmonary infiltrate or pleural effusion. Musculoskeletal:  No suspicious bone lesions identified. CT ABDOMEN AND PELVIS FINDINGS Hepatobiliary: Hepatic cirrhosis again noted. Multiple small hypovascular liver masses are again seen but show mild decrease in size since previous study. Index lesion in the dome of the right hepatic lobe measures 2.1 x 1.9 cm on image 41/2, compared to 2.5 x 2.5 cm previously. Another index lesion in the inferior right hepatic lobe measures 1.7 x 1.3 cm on image 57/2, decreased from 2.3 x 1.4 cm previously. No new or enlarging liver masses identified. Prior cholecystectomy. No evidence of biliary obstruction. Pancreas: No residual mass is seen at the junction of the pancreatic body and tail. Atrophy and ductal dilatation is  again seen in the pancreatic tail. Spleen:  Within normal limits in size and appearance. Adrenals/Urinary tract: 3.2 x 2.4 cm left adrenal mass remains stable. Right adrenal gland is normal in appearance. Tiny sub-cm right renal cyst again noted. No masses or hydronephrosis. Mild diffuse bladder wall thickening is again noted. Stomach/Bowel: No evidence of obstruction, inflammatory process, or abnormal fluid collections. Diverticulosis is seen mainly involving the sigmoid colon, however there is no evidence of diverticulitis. Vascular/Lymphatic: No pathologically enlarged lymph nodes identified. No acute vascular findings. Aortic atherosclerotic calcification noted. Reproductive: Prior hysterectomy noted. Adnexal regions are unremarkable in appearance. Other:  None. Musculoskeletal:  No suspicious bone lesions identified. IMPRESSION: No new or progressive metastatic disease within the abdomen or pelvis. Mild decrease in hepatic metastatic disease. Stricture at junction of the pancreatic body and tail, without residual soft tissue mass. Stable left adrenal mass. Colonic diverticulosis, without radiographic evidence of diverticulitis. Stable mild diffuse bladder wall thickening, which may be due to chronic cystitis or chronic bladder outlet obstruction. Thrombosis of right internal jugular vein surrounding the right jugular Port-A-Cath. These results will be called to the ordering clinician or representative by the Radiologist Assistant, and communication documented in the PACS or [employer] Aortic Atherosclerosis (ICD10-I70.0) and Emphysema (ICD10-J43.9). Electronically Signed   By: JMarlaine HindM.D.   On: 08/25/2020 09:01     ASSESSMENT & PLAN:  GBitha Fauteuxis a 70y.o. female with   1. Pancreatic cancer with Liver metastasis, stage IV  -Her CT AP from 01/13/20 showed 2cm mass on tail of pancreas. The mass abuts the posterior gastric wall but without definite gastric invasion. Scan also shows  multiple (at least 5) hepatic masses highly suspicious for metastatic lesions and Questionable 0.8 by 0.8 cm omental tumor nodule versus dense  diverticulum above the transverse colon. -Her liver biopsy from 01/28/20 showed adenocarcinoma from pancreatic primary. Her 02/11/20 CT Chest showed no metastasis  -I started her on first line chemo with gemcitabine and Abraxane every 2 weeks beginning 02/26/20. D/c after 05/19/20 due to disease progression in liver and chemo related neuropathy. -she is now on second-line FOLFIRI q2weeks starting 06/02/20. she is tolerating very well -restaging CT CAP 08/24/20 showed improved liver metastasis, no new or progressive disease.  Overall stable.  I reviewed the image with patient and her sister in person today.  Plan to continue current chemotherapy. -labs reviewed, overall adequate to proceed with C7 today.   2. Abdominal Pain, low appetite, weight loss, Transmanitis -She had abdominal pain for the past month before cancer diagnosis, 5/10. She is on Norco 5-390m BID as needed. Her pain has much improved since chemo  -She has regained most of her lost weight since loss starting in 10/2019   3. Comorbidities: Asthma, Borderline Diabetic, GERD, Heart murmur, HLD, HTN, arthritis -On medications, including multiple diuretics. -Managed by PCP.    4. Social support -She lives alone in 1 level house and she is retired. She has 1 adult son who [place] in [place] She has a twin and 3 other sisters who live in [place] and other siblings and relative in [place]  -Her son helps her at home  -She has approved for grant to help with medication. Her sister will notify me of the type of grant.    6. Goal of care discussion -The patient understands the goal of care is palliative. -I recommend DNR/DNI, she will think about it    7. Hypokalemia  -She is on Lasix and dieretics for her LE edema and heart.  -continue oral K   8. Genetic testing from 02/19/20 was negative for  pathogenetic mutation with VUS of gene NF1   9.  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   No orders of the defined types were placed in this encounter.  All questions were answered. The patient knows to call the clinic with any problems, questions or concerns. No barriers to learning was detected. The total time spent in the appointment was 30 minutes.     YTruitt Merle MD 08/25/2020   I, KWilburn Mylar am acting as scribe for YTruitt Merle MD.   I have reviewed the above documentation for accuracy and completeness, and I agree with the above.

## 2020-08-26 ENCOUNTER — Other Ambulatory Visit: Payer: Self-pay | Admitting: Nurse Practitioner

## 2020-08-26 ENCOUNTER — Other Ambulatory Visit: Payer: Self-pay | Admitting: Hematology

## 2020-08-27 ENCOUNTER — Other Ambulatory Visit: Payer: Self-pay

## 2020-08-27 ENCOUNTER — Inpatient Hospital Stay: Payer: Medicare Other

## 2020-08-27 VITALS — BP 130/75 | HR 70 | Temp 98.8°F | Resp 18

## 2020-08-27 DIAGNOSIS — Z7189 Other specified counseling: Secondary | ICD-10-CM

## 2020-08-27 DIAGNOSIS — Z452 Encounter for adjustment and management of vascular access device: Secondary | ICD-10-CM | POA: Diagnosis not present

## 2020-08-27 DIAGNOSIS — C252 Malignant neoplasm of tail of pancreas: Secondary | ICD-10-CM

## 2020-08-27 DIAGNOSIS — C787 Secondary malignant neoplasm of liver and intrahepatic bile duct: Secondary | ICD-10-CM | POA: Diagnosis not present

## 2020-08-27 DIAGNOSIS — Z5111 Encounter for antineoplastic chemotherapy: Secondary | ICD-10-CM | POA: Diagnosis not present

## 2020-08-27 MED ORDER — SODIUM CHLORIDE 0.9% FLUSH
10.0000 mL | INTRAVENOUS | Status: DC | PRN
  Administered 2020-08-27: 10 mL
  Filled 2020-08-27: qty 10

## 2020-08-27 MED ORDER — HEPARIN SOD (PORK) LOCK FLUSH 100 UNIT/ML IV SOLN
500.0000 [IU] | Freq: Once | INTRAVENOUS | Status: AC | PRN
  Administered 2020-08-27: 500 [IU]
  Filled 2020-08-27: qty 5

## 2020-09-02 DIAGNOSIS — C252 Malignant neoplasm of tail of pancreas: Secondary | ICD-10-CM | POA: Diagnosis not present

## 2020-09-06 ENCOUNTER — Encounter: Payer: Medicare Other | Admitting: Internal Medicine

## 2020-09-07 MED FILL — Dexamethasone Sodium Phosphate Inj 100 MG/10ML: INTRAMUSCULAR | Qty: 1 | Status: AC

## 2020-09-08 ENCOUNTER — Other Ambulatory Visit: Payer: Self-pay

## 2020-09-08 ENCOUNTER — Inpatient Hospital Stay (HOSPITAL_BASED_OUTPATIENT_CLINIC_OR_DEPARTMENT_OTHER): Payer: Medicare Other | Admitting: Hematology

## 2020-09-08 ENCOUNTER — Inpatient Hospital Stay: Payer: Medicare Other

## 2020-09-08 ENCOUNTER — Encounter: Payer: Self-pay | Admitting: Hematology

## 2020-09-08 VITALS — BP 132/66 | HR 62 | Temp 98.6°F | Resp 18 | Ht 60.0 in | Wt 146.2 lb

## 2020-09-08 DIAGNOSIS — Z7189 Other specified counseling: Secondary | ICD-10-CM

## 2020-09-08 DIAGNOSIS — C252 Malignant neoplasm of tail of pancreas: Secondary | ICD-10-CM

## 2020-09-08 DIAGNOSIS — C787 Secondary malignant neoplasm of liver and intrahepatic bile duct: Secondary | ICD-10-CM | POA: Diagnosis not present

## 2020-09-08 DIAGNOSIS — Z95828 Presence of other vascular implants and grafts: Secondary | ICD-10-CM

## 2020-09-08 DIAGNOSIS — Z452 Encounter for adjustment and management of vascular access device: Secondary | ICD-10-CM | POA: Diagnosis not present

## 2020-09-08 DIAGNOSIS — Z5111 Encounter for antineoplastic chemotherapy: Secondary | ICD-10-CM | POA: Diagnosis not present

## 2020-09-08 LAB — CMP (CANCER CENTER ONLY)
ALT: 26 U/L (ref 0–44)
AST: 34 U/L (ref 15–41)
Albumin: 2.9 g/dL — ABNORMAL LOW (ref 3.5–5.0)
Alkaline Phosphatase: 159 U/L — ABNORMAL HIGH (ref 38–126)
Anion gap: 8 (ref 5–15)
BUN: 14 mg/dL (ref 8–23)
CO2: 28 mmol/L (ref 22–32)
Calcium: 8.8 mg/dL — ABNORMAL LOW (ref 8.9–10.3)
Chloride: 104 mmol/L (ref 98–111)
Creatinine: 0.79 mg/dL (ref 0.44–1.00)
GFR, Estimated: >60 mL/min (ref >60)
Glucose, Bld: 197 mg/dL — ABNORMAL HIGH (ref 70–99)
Potassium: 3.6 mmol/L (ref 3.5–5.1)
Sodium: 140 mmol/L (ref 135–145)
Total Bilirubin: 0.5 mg/dL (ref 0.3–1.2)
Total Protein: 6.1 g/dL — ABNORMAL LOW (ref 6.5–8.1)

## 2020-09-08 LAB — CBC WITH DIFFERENTIAL (CANCER CENTER ONLY)
Abs Immature Granulocytes: 0.01 10*3/uL (ref 0.00–0.07)
Basophils Absolute: 0.1 10*3/uL (ref 0.0–0.1)
Basophils Relative: 1 %
Eosinophils Absolute: 0.3 10*3/uL (ref 0.0–0.5)
Eosinophils Relative: 7 %
HCT: 35.1 % — ABNORMAL LOW (ref 36.0–46.0)
Hemoglobin: 11.2 g/dL — ABNORMAL LOW (ref 12.0–15.0)
Immature Granulocytes: 0 %
Lymphocytes Relative: 7 %
Lymphs Abs: 0.4 10*3/uL — ABNORMAL LOW (ref 0.7–4.0)
MCH: 29.6 pg (ref 26.0–34.0)
MCHC: 31.9 g/dL (ref 30.0–36.0)
MCV: 92.9 fL (ref 80.0–100.0)
Monocytes Absolute: 0.7 10*3/uL (ref 0.1–1.0)
Monocytes Relative: 14 %
Neutro Abs: 3.6 10*3/uL (ref 1.7–7.7)
Neutrophils Relative %: 71 %
Platelet Count: 141 10*3/uL — ABNORMAL LOW (ref 150–400)
RBC: 3.78 MIL/uL — ABNORMAL LOW (ref 3.87–5.11)
RDW: 18.9 % — ABNORMAL HIGH (ref 11.5–15.5)
WBC Count: 5.2 10*3/uL (ref 4.0–10.5)
nRBC: 0 % (ref 0.0–0.2)

## 2020-09-08 MED ORDER — SODIUM CHLORIDE 0.9 % IV SOLN
10.0000 mg | Freq: Once | INTRAVENOUS | Status: AC
  Administered 2020-09-08: 10 mg via INTRAVENOUS
  Filled 2020-09-08: qty 10

## 2020-09-08 MED ORDER — SODIUM CHLORIDE 0.9 % IV SOLN
70.0000 mg/m2 | Freq: Once | INTRAVENOUS | Status: AC
  Administered 2020-09-08: 116.1 mg via INTRAVENOUS
  Filled 2020-09-08: qty 27

## 2020-09-08 MED ORDER — PALONOSETRON HCL INJECTION 0.25 MG/5ML
0.2500 mg | Freq: Once | INTRAVENOUS | Status: AC
  Administered 2020-09-08: 0.25 mg via INTRAVENOUS
  Filled 2020-09-08: qty 5

## 2020-09-08 MED ORDER — SODIUM CHLORIDE 0.9 % IV SOLN
2400.0000 mg/m2 | INTRAVENOUS | Status: DC
  Administered 2020-09-08: 3950 mg via INTRAVENOUS
  Filled 2020-09-08: qty 79

## 2020-09-08 MED ORDER — SODIUM CHLORIDE 0.9 % IV SOLN: Freq: Once | INTRAVENOUS | Status: AC

## 2020-09-08 MED ORDER — SODIUM CHLORIDE 0.9% FLUSH: 10.0000 mL | Freq: Once | INTRAVENOUS | Status: DC

## 2020-09-08 MED ORDER — SODIUM CHLORIDE 0.9 % IV SOLN
400.0000 mg/m2 | Freq: Once | INTRAVENOUS | Status: AC
  Administered 2020-09-08: 660 mg via INTRAVENOUS
  Filled 2020-09-08: qty 33

## 2020-09-08 NOTE — Progress Notes (Signed)
[place]   Telephone:([phone] Fax:(336) [phone]   Clinic Follow up Note   Patient Care Team: Sid Falcon, MD as PCP - General (Internal Medicine) Forrest Moron, [place] (Optometry) Jonnie Finner, RN (Inactive) as Oncology Nurse Navigator Truitt Merle, MD as Consulting Physician (Oncology) Gwyndolyn Kaufman, RN (Inactive) as Registered Nurse  Date of Service:  09/08/2020  CHIEF COMPLAINT: f/u of pancreatic cancer with liver metastasis  SUMMARY OF ONCOLOGIC HISTORY: Oncology History Overview Note  Cancer Staging Pancreatic cancer [facility]) Staging form: Exocrine Pancreas, AJCC 8th Edition - Clinical: Stage IV (cT1c, cN0, pM1) - Signed by Truitt Merle, MD on 02/26/2020 Total positive nodes: 0    Pancreatic cancer ([place])  01/13/2020 Imaging   US Abdomen 01/13/20  IMPRESSION: 1. Suggestion of 3 vascular hypoechoic mass within the liver measuring up to 2.5 cm. 2. Suggestion of a hypoechoic mass within the tail of the pancreas that is not well visualized. 3. Findings concerning for malignancy, please see separately dictated CT abdomen pelvis 01/13/2020.   01/13/2020 Imaging   CT AP 01/13/20  IMPRESSION: 1. Hypoenhancing 2.0 cm in long axis mass in the pancreatic tail compatible with pancreatic adenocarcinoma. The mass abuts the posterior gastric wall but without definite gastric invasion. The splenic vein appears narrowed/attenuated compared to prior exams and the lesion abuts the margin of the splenic artery. 2. Hypoenhancing hepatic masses are new compared to 11/22/2017 and are highly suspicious for metastatic lesions. 3. Questionable 0.8 by 0.8 cm omental tumor nodule versus dense diverticulum above the transverse colon. 4. Other imaging findings of potential clinical significance: Mild distal esophageal wall thickening, query esophagitis. Stable left adrenal adenoma. Sigmoid colon diverticulosis. Lumbar and thoracic spondylosis and degenerative disc  disease contributing to multilevel impingement. 5. Aortic atherosclerosis. Aortic Atherosclerosis (ICD10-I70.0).   01/21/2020 Initial Diagnosis   Pancreatic cancer ([place])   01/28/2020 Initial Biopsy   FINAL MICROSCOPIC DIAGNOSIS:   A. LIVER, NEEDLE CORE BIOPSY:  - Adenocarcinoma.   COMMENT:   Immunohistochemistry for CK7 is positive.  CDX-2 demonstrates weak to  moderate positive staining.  TTF-1 and PAX 8 demonstrate weak, likely  nonspecific staining.  CK20, GATA-3 and ER are negative.  The  morphologic and immunophenotypic characteristics are compatible with the  clinical impression of a primary pancreatic cancer.  Radiologic  correlation is encouraged.  If applicable, there is likely sufficient  tissue for ancillary studies (Block A2).  Dr. Vic Ripper reviewed the  case.    02/26/2020 - 05/19/2020 Chemotherapy   First-line Gemcitabine and Abraxane every 2 weeks starting 02/26/20. D/c after 05/19/20 due to disease progression in liver and neuropathy     02/26/2020 Cancer Staging   Staging form: Exocrine Pancreas, AJCC 8th Edition - Clinical: Stage IV (cT1c, cN0, pM1) - Signed by Truitt Merle, MD on 02/26/2020 Total positive nodes: 0   03/08/2020 Genetic Testing   Negative hereditary cancer genetic testing: no pathogenic variants detected in [facility] Common Hereditary Cancers Panel with preliminary evidence pancreatic cancer genes.  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 The report date is March 08, 2020.    The Common Hereditary Cancers Panel with preliminary evidence pancreatic cancer genes offered by [facility] includes sequencing and/or deletion duplication testing of the following 49 genes: APC, ATM, AXIN2, BARD1, BMPR1A, BRCA1, BRCA2, BRIP1, CDH1, CDK4, CDKN2A (p14ARF), CDKN2A (p16INK4a), CHEK2, CTNNA1, DICER1, EPCAM (Deletion/duplication testing only), GREM1 (promoter region deletion/duplication testing only), FANCC, GREM1, HOXB13, KIT, MEN1, MLH1,  MSH2, MSH3, MSH6, MUTYH, NBN, NF1,  NHTL1, PALB2, PALLD, PDGFRA, PMS2, POLD1, POLE, PTEN, RAD50, RAD51C, RAD51D, SDHA, SDHB, SDHC, SDHD, SMAD4, SMARCA4. STK11, TP53, TSC1, TSC2, and VHL.  The following genes were evaluated for sequence changes only: SDHA and HOXB13 c.251G>A variant only.es only: SDHA and HOXB13 c.251G>A variant only.   05/03/2020 Imaging   CT AP  IMPRESSION: 1. Interval decrease in size of a hypodense mass of the central pancreatic tail, consistent with treatment response of primary pancreatic malignancy. 2. Numerous low-attenuation liver lesions are seen, increased in size and number compared to prior examination. Findings are consistent with worsened hepatic metastatic disease. 3. A previously queried omental nodule adjacent to the transverse colon is more clearly a prominent diverticulum on current examination.   Aortic Atherosclerosis (ICD10-I70.0).   06/02/2020 -  Chemotherapy   Second-line FOLFIRI q2weeks starting 06/02/20       CURRENT THERAPY:  Second-line FOLFIRI q2weeks starting 06/02/20.   INTERVAL HISTORY:  Mariah Henderson is here for a follow up of metastatic pancreatic cancer. She was last seen by me on 08/25/20. She presents to the clinic accompanied by her daughter. She reports doing well overall. She notes she is eating and drinking a lot of sweets. She denies problems with her bowels. She reports continued numbness in her hands. She notes she drops things and has trouble with fine motor tasks. She reports her feet are stable, and she has not fallen.   All other systems were reviewed with the patient and are negative.  MEDICAL HISTORY:  Past Medical History:  Diagnosis Date   Acute medial meniscus tear    right knee   Anxiety    Asthma    Cancer ([place])    Cervical spondylosis    Chronic serous otitis media    Constipation    COPD (chronic obstructive pulmonary disease) (HCC)    Depression    followed by [name]; no longer of depakote,  seroquel   Diabetes mellitus without complication ([place])    Dysuria 12/27/2009   Qualifier: Diagnosis of  By: [name] MD, Amanjot     Foot drop    GERD (gastroesophageal reflux disease)    Heart murmur    Hyperlipidemia    Hypertension    Low back pain    Obesity    Paraumbilical hernia    Pre-diabetes     SURGICAL HISTORY: Past Surgical History:  Procedure Laterality Date   ABDOMINAL HYSTERECTOMY     ANTERIOR CERVICAL DECOMP/DISCECTOMY FUSION N/A 10/05/2014   Procedure: ACDF - C5-C6 - C6-C7;  Surgeon: Karie Chimera, MD;  Location: [place] [facility];  Service: Neurosurgery;  Laterality: N/A;  ACDF - C5-C6 - C6-C7   CHOLECYSTECTOMY     COLONOSCOPY     FOOT SURGERY     HERNIA REPAIR  1999   IR IMAGING GUIDED PORT INSERTION  02/25/2020   IR US GUIDANCE  01/28/2020   KNEE ARTHROSCOPY WITH MEDIAL MENISECTOMY Right 06/14/2016   Procedure: RIGHT KNEE ARTHROSCOPY WITH PARTIAL MEDIAL MENISCECTOMY, SUBCHONDROPLASTY;  Surgeon: Leandrew Koyanagi, MD;  Location: [place];  Service: Orthopedics;  Laterality: Right;   KNEE ARTHROSCOPY WITH SUBCHONDROPLASTY Right 06/14/2016   Procedure: KNEE ARTHROSCOPY WITH SUBCHONDROPLASTY;  Surgeon: Leandrew Koyanagi, MD;  Location: [place];  Service: Orthopedics;  Laterality: Right;   RADIOACTIVE SEED GUIDED EXCISIONAL BREAST BIOPSY Left 06/23/2014   Procedure: RADIOACTIVE SEED GUIDED EXCISIONAL BREAST BIOPSY;  Surgeon: Stark Klein, MD;  Location: [place];  Service: General;  Laterality: Left;  I have reviewed the social history and family history with the patient and they are unchanged from previous note.  ALLERGIES:  has No Known Allergies.  MEDICATIONS:  Current Outpatient Medications  Medication Sig Dispense Refill   albuterol (PROVENTIL) (2.5 MG/3ML) 0.083% nebulizer solution Take 3 mLs (2.5 mg total) by nebulization every 6 (six) hours as needed for wheezing. 1080 mL 3   albuterol (VENTOLIN HFA) 108 (90 Base)  MCG/ACT inhaler INHALE 1 TO 2 PUFFS INTO THE LUNGS EVERY 6 HOURS AS NEEDED FOR WHEEZING OR SHORTNESS OF BREATH 54 g 1   alendronate (FOSAMAX) 70 MG tablet TAKE 1 TABLET BY MOUTH EVERY 7 DAYS. TAKE WITH A FULL GLASS OF WATER ON AN EMPTY STOMACH. 12 tablet 3   atorvastatin (LIPITOR) 40 MG tablet TAKE 1 TABLET BY MOUTH EVERY DAY 90 tablet 3   benazepril (LOTENSIN) 20 MG tablet TAKE 1 TABLET(20 MG) BY MOUTH DAILY 90 tablet 3   calcium carbonate (OS-CAL) 600 MG TABS tablet Take by mouth.     celecoxib (CELEBREX) 100 MG capsule TAKE 1 CAPSULE BY MOUTH  TWICE DAILY 180 capsule 3   cholecalciferol (VITAMIN D) 1000 units tablet Take 2,000 Units by mouth daily.     diclofenac Sodium (VOLTAREN) 1 % GEL Apply 2 g topically 4 (four) times daily. 100 g 2   famotidine (PEPCID) 40 MG tablet TAKE 1/2 TABLET(20 MG) BY MOUTH TWICE DAILY 30 tablet 3   fluticasone (FLONASE) 50 MCG/ACT nasal spray Place 1 spray into both nostrils daily. 16 g 3   Fluticasone-Salmeterol (ADVAIR DISKUS) 100-50 MCG/DOSE AEPB Inhale 1 puff into the lungs 2 (two) times daily. 60 each 11   furosemide (LASIX) 20 MG tablet Take 1 tablet by mouth once daily on day of chemo and while 5FU infusing, then take 1/2 tab (10 mg) by mouth daily 30 tablet 1   gabapentin (NEURONTIN) 100 MG capsule Take 1 capsule (100 mg total) by mouth 3 (three) times daily. 90 capsule 1   glipiZIDE (GLUCOTROL XL) 2.5 MG 24 hr tablet TAKE 1 TABLET(2.5 MG) BY MOUTH DAILY WITH BREAKFAST 30 tablet 2   glucose blood (ONETOUCH VERIO) test strip Use as instructed 100 each 12   hydrochlorothiazide (MICROZIDE) 12.5 MG capsule TAKE 1 CAPSULE(12.5 MG) BY MOUTH DAILY 90 capsule 1   HYDROcodone-acetaminophen (NORCO/VICODIN) 5-325 MG tablet Take 1 tablet by mouth 2 (two) times daily as needed for severe pain. 60 tablet 0   ipratropium (ATROVENT) 0.02 % nebulizer solution Take 0.5 mg by nebulization every 6 (six) hours as needed for wheezing or shortness of breath.     Lancets (ONETOUCH  ULTRASOFT) lancets Use as instructed 100 each 12   latanoprost (XALATAN) 0.005 % ophthalmic solution PLACE 1 DROP INTO BOTH EYES AT BEDTIME  0   loperamide (LOPERAMIDE A-D) 2 MG tablet Take 1 tablet (2 mg total) by mouth 2 (two) times daily as needed for diarrhea or loose stools. 6 tablet 0   loratadine (CLARITIN) 10 MG tablet Take 1 tablet (10 mg total) by mouth daily. 30 tablet 11   magic mouthwash w/lidocaine SOLN Take 5 mLs by mouth 4 (four) times daily as needed for mouth pain. 480 mL 2   meclizine (ANTIVERT) 12.5 MG tablet TAKE 1 TABLET(12.5 MG) BY MOUTH TWICE DAILY AS NEEDED FOR DIZZINESS 15 tablet 3   potassium chloride SA (KLOR-CON) 20 MEQ tablet TAKE 1 TABLET(20 MEQ) BY MOUTH THREE TIMES DAILY 90 tablet 0   prochlorperazine (COMPAZINE) 10 MG tablet TAKE 1  TABLET(10 MG) BY MOUTH EVERY 6 HOURS AS NEEDED FOR NAUSEA OR VOMITING 30 tablet 3   risperiDONE (RISPERDAL) 0.25 MG tablet Take 0.25 mg by mouth daily as needed.     RIVAROXABAN (XARELTO) VTE STARTER PACK (15 & 20 MG) Follow package directions: Take one 65m tablet by mouth twice a day. On day 22, switch to one 291mtablet once a day. Take with food. 51 each 0   sertraline (ZOLOFT) 50 MG tablet Take 50 mg by mouth daily.     traZODone (DESYREL) 50 MG tablet Take 25-50 mg by mouth at bedtime as needed for sleep.      triamcinolone cream (KENALOG) 0.1 % apply to affected area twice a day 454 g 1   No current facility-administered medications for this visit.   Facility-Administered Medications Ordered in Other Visits  Medication Dose Route Frequency Provider Last Rate Last Admin   fluorouracil (ADRUCIL) 3,950 mg in sodium chloride 0.9 % 71 mL chemo infusion  2,400 mg/m2 (Treatment Plan Recorded) Intravenous 1 day or 1 dose FeTruitt MerleMD       heparin lock flush 100 unit/mL  500 Units Intracatheter Once PRN FeTruitt MerleMD       irinotecan LIPOSOME (ONIVYDE) 116.1 mg in sodium chloride 0.9 % 500 mL chemo infusion  70 mg/m2 (Treatment Plan  Recorded) Intravenous Once FeTruitt MerleMD 351 mL/hr at 09/08/20 1358 Infusion Verify at 09/08/20 1358   leucovorin 660 mg in sodium chloride 0.9 % 250 mL infusion  400 mg/m2 (Treatment Plan Recorded) Intravenous Once FeTruitt MerleMD       sodium chloride flush (NS) 0.9 % injection 10 mL  10 mL Intracatheter PRN FeTruitt MerleMD        PHYSICAL EXAMINATION: ECOG PERFORMANCE STATUS: 1 - Symptomatic but completely ambulatory  Vitals:   09/08/20 1145  BP: 132/66  Pulse: 62  Resp: 18  Temp: 98.6 F (37 C)  SpO2: 97%   Wt Readings from Last 3 Encounters:  09/08/20 146 lb 3.2 oz (66.3 kg)  08/25/20 142 lb 8 oz (64.6 kg)  08/11/20 154 lb 11.2 oz (70.2 kg)     GENERAL:alert, no distress and comfortable SKIN: skin color normal, no rashes or significant lesions EYES: normal, Conjunctiva are pink and non-injected, sclera clear  NEURO: alert & oriented x 3 with fluent speech  LABORATORY DATA:  I have reviewed the data as listed CBC Latest Ref Rng & Units 09/08/2020 08/25/2020 08/11/2020  WBC 4.0 - 10.5 K/uL 5.2 4.9 4.1  Hemoglobin 12.0 - 15.0 g/dL 11.2(L) 11.9(L) 11.8(L)  Hematocrit 36.0 - 46.0 % 35.1(L) 35.9(L) 35.9(L)  Platelets 150 - 400 K/uL 141(L) 142(L) 156     CMP Latest Ref Rng & Units 09/08/2020 08/25/2020 08/11/2020  Glucose 70 - 99 mg/dL 197(H) 142(H) 257(H)  BUN 8 - 23 mg/dL '14 9 9  ' Creatinine 0.44 - 1.00 mg/dL 0.79 0.88 0.79  Sodium 135 - 145 mmol/L 140 140 140  Potassium 3.5 - 5.1 mmol/L 3.6 3.8 3.8  Chloride 98 - 111 mmol/L 104 104 103  CO2 22 - 32 mmol/L '28 27 31  ' Calcium 8.9 - 10.3 mg/dL 8.8(L) 9.5 9.2  Total Protein 6.5 - 8.1 g/dL 6.1(L) 6.4(L) 6.4(L)  Total Bilirubin 0.3 - 1.2 mg/dL 0.5 0.5 0.5  Alkaline Phos 38 - 126 U/L 159(H) 161(H) 174(H)  AST 15 - 41 U/L 34 35 34  ALT 0 - 44 U/L '26 26 23      ' RADIOGRAPHIC STUDIES: I  have personally reviewed the radiological images as listed and agreed with the findings in the report. No results found.   ASSESSMENT & PLAN:   Mariah Henderson is a 70 y.o. female with   1. Pancreatic cancer with Liver metastasis, stage IV  -Her CT AP from 01/13/20 showed 2cm mass on tail of pancreas. The mass abuts the posterior gastric wall but without definite gastric invasion. Scan also shows multiple (at least 5) hepatic masses highly suspicious for metastatic lesions and Questionable 0.8 by 0.8 cm omental tumor nodule versus dense diverticulum above the transverse colon. -Her liver biopsy from 01/28/20 showed adenocarcinoma from pancreatic primary. Her 02/11/20 CT Chest showed no metastasis  -I started her on first line chemo with gemcitabine and Abraxane every 2 weeks beginning 02/26/20. D/c after 05/19/20 due to disease progression in liver and chemo related neuropathy. -she is now on second-line FOLFIRI q2weeks starting 06/02/20. she is tolerating very well -restaging CT CAP 08/24/20 showed improved liver metastasis, no new or progressive disease.  Overall stable. We will continue current chemotherapy. Plan to repeat scan in 3 months. -labs reviewed, overall adequate to proceed with C8 today.   2. Abdominal Pain, low appetite, weight loss, Transmanitis -She had abdominal pain for the past month before cancer diagnosis, 5/10. She is on Norco 5-332m BID as needed. Her pain has much improved since chemo  -She has regained most of her lost weight since loss starting in 10/2019   3. Comorbidities: Asthma, Borderline Diabetic, GERD, Heart murmur, HLD, HTN, arthritis -On medications, including multiple diuretics. -Managed by PCP.    4. Social support -She lives alone in 1 level house and she is retired. She has 1 adult son who lives in [place] She has a twin and 3 other sisters who live in [place] and other siblings and relative in [place]  -Her son helps her at home  -She has approved for grant to help with medication. Her sister will notify me of the type of grant.    6. Goal of care discussion -The patient understands the goal of  care is palliative. -I recommend DNR/DNI, she will think about it    7. Hypokalemia  -She is on Lasix and dieretics for her LE edema and heart.  -continue oral K   8. Genetic testing from 02/19/20 was negative for pathogenetic mutation with VUS of gene NF1   9.  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 She takes it at night as needed. Given the continued neuropathy in her fingers, I recommend she take it regularly. -Continue monitoring    PLAN:  -proceed to C8 FOLFIRI today at same dose  -labs and f/u and chemo in 2 weeks     No problem-specific Assessment & Plan notes found for this encounter.   No orders of the defined types were placed in this encounter.  All questions were answered. The patient knows to call the clinic with any problems, questions or concerns. No barriers to learning was detected. The total time spent in the appointment was 30 minutes.     YTruitt Merle MD 09/08/2020   I, KWilburn Mylar am acting as scribe for YTruitt Merle MD.   I have reviewed the above documentation for accuracy and completeness, and I agree with the above.

## 2020-09-08 NOTE — Patient Instructions (Signed)
[facility]   Discharge Instructions: Thank you for choosing [facility] to provide your oncology and hematology care.   If you have a lab appointment with the [facility], please go directly to the [facility] and check in at the registration area.   Wear comfortable clothing and clothing appropriate for easy access to any Portacath or PICC line.   We strive to give you quality time with your provider. You may need to reschedule your appointment if you arrive late (15 or more minutes).  Arriving late affects you and other patients whose appointments are after yours.  Also, if you miss three or more appointments without notifying the office, you may be dismissed from the clinic at the provider's discretion.      For prescription refill requests, have your pharmacy contact our office and allow 72 hours for refills to be completed.    Today you received the following chemotherapy and/or immunotherapy agents: liposomal irinotecan, leucovorin, and fluorouracil.       To help prevent nausea and vomiting after your treatment, we encourage you to take your nausea medication as directed.  BELOW ARE SYMPTOMS THAT SHOULD BE REPORTED IMMEDIATELY: *FEVER GREATER THAN 100.4 F (38 C) OR HIGHER *CHILLS OR SWEATING *NAUSEA AND VOMITING THAT IS NOT CONTROLLED WITH YOUR NAUSEA MEDICATION *UNUSUAL SHORTNESS OF BREATH *UNUSUAL BRUISING OR BLEEDING *URINARY PROBLEMS (pain or burning when urinating, or frequent urination) *BOWEL PROBLEMS (unusual diarrhea, constipation, pain near the anus) TENDERNESS IN MOUTH AND THROAT WITH OR WITHOUT PRESENCE OF ULCERS (sore throat, sores in mouth, or a toothache) UNUSUAL RASH, SWELLING OR PAIN  UNUSUAL VAGINAL DISCHARGE OR ITCHING   Items with * indicate a potential emergency and should be followed up as soon as possible or go to the Emergency Department if any problems should occur.  Please show the CHEMOTHERAPY ALERT  CARD or IMMUNOTHERAPY ALERT CARD at check-in to the Emergency Department and triage nurse.  Should you have questions after your visit or need to cancel or reschedule your appointment, please contact [facility]  Dept: [phone]  and follow the prompts.  Office hours are 8:00 a.m. to 4:30 p.m. Monday - Friday. Please note that voicemails left after 4:00 p.m. may not be returned until the following business day.  We are closed weekends and major holidays. You have access to a nurse at all times for urgent questions. Please call the main number to the clinic Dept: [phone] and follow the prompts.   For any non-urgent questions, you may also contact your provider using MyChart. We now offer e-Visits for anyone 69 and older to request care online for non-urgent symptoms. For details visit [email or web address].   Also download the MyChart app! Go to the app store, search "MyChart", open the app, select [facility], and log in with your MyChart username and password.  Due to Covid, a mask is required upon entering the hospital/clinic. If you do not have a mask, one will be given to you upon arrival. For doctor visits, patients may have 1 support person aged 47 or older with them. For treatment visits, patients cannot have anyone with them due to current Covid guidelines and our immunocompromised population.

## 2020-09-09 ENCOUNTER — Other Ambulatory Visit: Payer: Medicare Other

## 2020-09-09 ENCOUNTER — Ambulatory Visit: Payer: Medicare Other

## 2020-09-09 ENCOUNTER — Ambulatory Visit: Payer: Medicare Other | Admitting: Hematology

## 2020-09-10 ENCOUNTER — Inpatient Hospital Stay: Payer: Medicare Other

## 2020-09-10 ENCOUNTER — Other Ambulatory Visit: Payer: Self-pay

## 2020-09-10 VITALS — BP 110/61 | HR 71 | Temp 99.2°F | Resp 18

## 2020-09-10 DIAGNOSIS — C252 Malignant neoplasm of tail of pancreas: Secondary | ICD-10-CM | POA: Diagnosis not present

## 2020-09-10 DIAGNOSIS — Z5111 Encounter for antineoplastic chemotherapy: Secondary | ICD-10-CM | POA: Diagnosis not present

## 2020-09-10 DIAGNOSIS — Z95828 Presence of other vascular implants and grafts: Secondary | ICD-10-CM

## 2020-09-10 DIAGNOSIS — C787 Secondary malignant neoplasm of liver and intrahepatic bile duct: Secondary | ICD-10-CM | POA: Diagnosis not present

## 2020-09-10 DIAGNOSIS — Z452 Encounter for adjustment and management of vascular access device: Secondary | ICD-10-CM | POA: Diagnosis not present

## 2020-09-10 MED ORDER — HEPARIN SOD (PORK) LOCK FLUSH 100 UNIT/ML IV SOLN
500.0000 [IU] | Freq: Once | INTRAVENOUS | Status: AC
Start: 2020-09-10 — End: 2020-09-10
  Administered 2020-09-10: 500 [IU]

## 2020-09-10 MED ORDER — SODIUM CHLORIDE 0.9% FLUSH
10.0000 mL | Freq: Once | INTRAVENOUS | Status: AC
  Administered 2020-09-10: 10 mL

## 2020-09-14 ENCOUNTER — Encounter: Payer: Medicare Other | Admitting: Internal Medicine

## 2020-09-21 MED FILL — Dexamethasone Sodium Phosphate Inj 100 MG/10ML: INTRAMUSCULAR | Qty: 1 | Status: AC

## 2020-09-22 ENCOUNTER — Inpatient Hospital Stay (HOSPITAL_BASED_OUTPATIENT_CLINIC_OR_DEPARTMENT_OTHER): Payer: Medicare Other | Admitting: Hematology

## 2020-09-22 ENCOUNTER — Encounter: Payer: Self-pay | Admitting: Hematology

## 2020-09-22 ENCOUNTER — Other Ambulatory Visit: Payer: Self-pay

## 2020-09-22 ENCOUNTER — Inpatient Hospital Stay: Payer: Medicare Other

## 2020-09-22 ENCOUNTER — Inpatient Hospital Stay: Payer: Medicare Other | Attending: Nurse Practitioner

## 2020-09-22 VITALS — BP 135/65 | HR 72 | Temp 98.2°F | Resp 17 | Wt 141.6 lb

## 2020-09-22 DIAGNOSIS — Z5111 Encounter for antineoplastic chemotherapy: Secondary | ICD-10-CM | POA: Insufficient documentation

## 2020-09-22 DIAGNOSIS — C252 Malignant neoplasm of tail of pancreas: Secondary | ICD-10-CM

## 2020-09-22 DIAGNOSIS — C787 Secondary malignant neoplasm of liver and intrahepatic bile duct: Secondary | ICD-10-CM | POA: Diagnosis not present

## 2020-09-22 DIAGNOSIS — Z7189 Other specified counseling: Secondary | ICD-10-CM

## 2020-09-22 DIAGNOSIS — Z452 Encounter for adjustment and management of vascular access device: Secondary | ICD-10-CM | POA: Insufficient documentation

## 2020-09-22 DIAGNOSIS — Z95828 Presence of other vascular implants and grafts: Secondary | ICD-10-CM

## 2020-09-22 LAB — CMP (CANCER CENTER ONLY)
ALT: 31 U/L (ref 0–44)
AST: 35 U/L (ref 15–41)
Albumin: 3.1 g/dL — ABNORMAL LOW (ref 3.5–5.0)
Alkaline Phosphatase: 125 U/L (ref 38–126)
Anion gap: 10 (ref 5–15)
BUN: 11 mg/dL (ref 8–23)
CO2: 27 mmol/L (ref 22–32)
Calcium: 9.5 mg/dL (ref 8.9–10.3)
Chloride: 103 mmol/L (ref 98–111)
Creatinine: 0.82 mg/dL (ref 0.44–1.00)
GFR, Estimated: >60 mL/min (ref >60)
Glucose, Bld: 152 mg/dL — ABNORMAL HIGH (ref 70–99)
Potassium: 3.8 mmol/L (ref 3.5–5.1)
Sodium: 140 mmol/L (ref 135–145)
Total Bilirubin: 0.7 mg/dL (ref 0.3–1.2)
Total Protein: 6.3 g/dL — ABNORMAL LOW (ref 6.5–8.1)

## 2020-09-22 LAB — CBC WITH DIFFERENTIAL (CANCER CENTER ONLY)
Abs Immature Granulocytes: 0.01 10*3/uL (ref 0.00–0.07)
Basophils Absolute: 0.1 10*3/uL (ref 0.0–0.1)
Basophils Relative: 1 %
Eosinophils Absolute: 0.4 10*3/uL (ref 0.0–0.5)
Eosinophils Relative: 8 %
HCT: 33.3 % — ABNORMAL LOW (ref 36.0–46.0)
Hemoglobin: 10.9 g/dL — ABNORMAL LOW (ref 12.0–15.0)
Immature Granulocytes: 0 %
Lymphocytes Relative: 10 %
Lymphs Abs: 0.4 10*3/uL — ABNORMAL LOW (ref 0.7–4.0)
MCH: 30.3 pg (ref 26.0–34.0)
MCHC: 32.7 g/dL (ref 30.0–36.0)
MCV: 92.5 fL (ref 80.0–100.0)
Monocytes Absolute: 0.6 10*3/uL (ref 0.1–1.0)
Monocytes Relative: 15 %
Neutro Abs: 2.7 10*3/uL (ref 1.7–7.7)
Neutrophils Relative %: 66 %
Platelet Count: 144 10*3/uL — ABNORMAL LOW (ref 150–400)
RBC: 3.6 MIL/uL — ABNORMAL LOW (ref 3.87–5.11)
RDW: 18.6 % — ABNORMAL HIGH (ref 11.5–15.5)
WBC Count: 4.2 10*3/uL (ref 4.0–10.5)
nRBC: 0 % (ref 0.0–0.2)

## 2020-09-22 MED ORDER — SODIUM CHLORIDE 0.9 % IV SOLN
400.0000 mg/m2 | Freq: Once | INTRAVENOUS | Status: AC
  Administered 2020-09-22: 660 mg via INTRAVENOUS
  Filled 2020-09-22: qty 33

## 2020-09-22 MED ORDER — SODIUM CHLORIDE 0.9 % IV SOLN
2400.0000 mg/m2 | INTRAVENOUS | Status: DC
  Administered 2020-09-22: 3950 mg via INTRAVENOUS
  Filled 2020-09-22: qty 79

## 2020-09-22 MED ORDER — HEPARIN SOD (PORK) LOCK FLUSH 100 UNIT/ML IV SOLN
500.0000 [IU] | Freq: Once | INTRAVENOUS | Status: DC | PRN
Start: 2020-09-22 — End: 2020-09-22

## 2020-09-22 MED ORDER — SODIUM CHLORIDE 0.9% FLUSH
10.0000 mL | Freq: Once | INTRAVENOUS | Status: AC
  Administered 2020-09-22: 10 mL

## 2020-09-22 MED ORDER — PALONOSETRON HCL INJECTION 0.25 MG/5ML
0.2500 mg | Freq: Once | INTRAVENOUS | Status: AC
  Administered 2020-09-22: 0.25 mg via INTRAVENOUS
  Filled 2020-09-22: qty 5

## 2020-09-22 MED ORDER — DEXAMETHASONE SODIUM PHOSPHATE 100 MG/10ML IJ SOLN
10.0000 mg | Freq: Once | INTRAMUSCULAR | Status: AC
  Administered 2020-09-22: 10 mg via INTRAVENOUS
  Filled 2020-09-22: qty 10

## 2020-09-22 MED ORDER — SODIUM CHLORIDE 0.9% FLUSH
10.0000 mL | INTRAVENOUS | Status: DC | PRN
  Administered 2020-09-22: 10 mL

## 2020-09-22 MED ORDER — SODIUM CHLORIDE 0.9 % IV SOLN
70.0000 mg/m2 | Freq: Once | INTRAVENOUS | Status: AC
  Administered 2020-09-22: 116.1 mg via INTRAVENOUS
  Filled 2020-09-22: qty 27

## 2020-09-22 MED ORDER — RIVAROXABAN 20 MG PO TABS: 20.0000 mg | ORAL_TABLET | Freq: Every day | ORAL | 3 refills | Status: DC

## 2020-09-22 MED ORDER — SODIUM CHLORIDE 0.9 % IV SOLN: Freq: Once | INTRAVENOUS | Status: AC

## 2020-09-22 NOTE — Progress Notes (Signed)
Mariah Henderson   [phone] [phone]   Clinic Follow up Note   Patient Care Team: Sid Falcon, MD as PCP - General (Internal Medicine) Forrest Moron, [place] (Optometry) Jonnie Finner, RN (Inactive) as Oncology Nurse Navigator Truitt Merle, MD as Consulting Physician (Oncology) Gwyndolyn Kaufman, RN (Inactive) as Registered Nurse  Date of Service:  09/22/2020  CHIEF COMPLAINT: f/u of pancreatic cancer with liver metastasis  ASSESSMENT & PLAN:  Mariah Henderson is a 70 y.o. female with   1. Pancreatic cancer with Liver metastasis, stage IV  -Her CT AP from 01/13/20 showed 2cm mass on tail of pancreas. The mass abuts the posterior gastric wall but without definite gastric invasion. Scan also shows multiple (at least 5) hepatic masses highly suspicious for metastatic lesions and Questionable 0.8 by 0.8 cm omental tumor nodule versus dense diverticulum above the transverse colon. -Her liver biopsy from 01/28/20 showed adenocarcinoma from pancreatic primary. Her 02/11/20 CT Chest showed no metastasis  -I started her on first line chemo with gemcitabine and Abraxane every 2 weeks beginning 02/26/20. D/c after 05/19/20 due to disease progression in liver and chemo related neuropathy. -she is now on second-line FOLFIRI q2weeks starting 06/02/20. she is tolerating very well -restaging CT CAP 08/24/20 showed improved liver metastasis, no new or progressive disease.  Overall stable. We will continue current chemotherapy. Plan to repeat scan in early Nov -she is clinically doing well and stable, labs reviewed, overall adequate to proceed with C9 today.   2. Abdominal Pain, low appetite, weight loss, Transmanitis -She had abdominal pain for the past month before cancer diagnosis, 5/10. She is on Norco 5-318m BID as needed. Her pain has much improved since chemo  -She has regained most of her lost weight since loss starting in 10/2019   3. Comorbidities: Asthma,  Borderline Diabetic, GERD, Heart murmur, HLD, HTN, arthritis -On medications, including multiple diuretics. -Managed by PCP.    4. Social support -She lives alone in 1 level house and she is retired. She has 1 adult son who lives in [place] She has a twin and 3 other sisters who live in [place] and other siblings and relative in [place]  -Her son helps her at home  -She has approved for grant to help with medication. Her sister will notify me of the type of grant.    6. Goal of care discussion -The patient understands the goal of care is palliative. -I recommend DNR/DNI, she will think about it    7. Hypokalemia  -She is on Lasix and dieretics for her LE edema and heart.  -continue oral K   8. Genetic testing from 02/19/20 was negative for pathogenetic mutation with VUS of gene NF1   9.  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 She takes it at night as needed. Given the continued neuropathy in her fingers, I recommend she take it regularly. -Continue monitoring    PLAN:  -proceed to C9 FOLFIRI today at same dose  -labs and f/u and chemo in 2 weeks   SUMMARY OF ONCOLOGIC HISTORY: Oncology History Overview Note  Cancer Staging Pancreatic cancer ([facility] Staging form: Exocrine Pancreas, AJCC 8th Edition - Clinical: Stage IV (cT1c, cN0, pM1) - Signed by FTruitt Merle MD on 02/26/2020 Total positive nodes: 0    [place]ancreatic cancer ([place]  01/13/2020 Imaging   [place]Abdomen 01/13/20  IMPRESSION: 1. Suggestion of 3 vascular hypoechoic mass within the liver measuring  up to 2.5 cm. 2. Suggestion of a hypoechoic mass within the tail of the pancreas that is not well visualized. 3. Findings concerning for malignancy, please see separately dictated CT abdomen pelvis 01/13/2020.   01/13/2020 Imaging   CT AP 01/13/20  IMPRESSION: 1. Hypoenhancing 2.0 cm in long axis mass in the pancreatic tail compatible with  pancreatic adenocarcinoma. The mass abuts the posterior gastric wall but without definite gastric invasion. The splenic vein appears narrowed/attenuated compared to prior exams and the lesion abuts the margin of the splenic artery. 2. Hypoenhancing hepatic masses are new compared to 11/22/2017 and are highly suspicious for metastatic lesions. 3. Questionable 0.8 by 0.8 cm omental tumor nodule versus dense diverticulum above the transverse colon. 4. Other imaging findings of potential clinical significance: Mild distal esophageal wall thickening, query esophagitis. Stable left adrenal adenoma. Sigmoid colon diverticulosis. Lumbar and thoracic spondylosis and degenerative disc disease contributing to multilevel impingement. 5. Aortic atherosclerosis. Aortic Atherosclerosis (ICD10-I70.0).   01/21/2020 Initial Diagnosis   Pancreatic cancer ([place])   01/28/2020 Initial Biopsy   FINAL MICROSCOPIC DIAGNOSIS:   A. LIVER, NEEDLE CORE BIOPSY:  - Adenocarcinoma.   COMMENT:   Immunohistochemistry for CK7 is positive.  CDX-2 demonstrates weak to  moderate positive staining.  TTF-1 and PAX 8 demonstrate weak, likely  nonspecific staining.  CK20, GATA-3 and ER are negative.  The  morphologic and immunophenotypic characteristics are compatible with the  clinical impression of a primary pancreatic cancer.  Radiologic  correlation is encouraged.  If applicable, there is likely sufficient  tissue for ancillary studies (Block A2).  Dr. Vic Ripper reviewed the  case.    02/26/2020 - 05/19/2020 Chemotherapy   First-line Gemcitabine and Abraxane every 2 weeks starting 02/26/20. D/c after 05/19/20 due to disease progression in liver and neuropathy     02/26/2020 Cancer Staging   Staging form: Exocrine Pancreas, AJCC 8th Edition - Clinical: Stage IV (cT1c, cN0, pM1) - Signed by Truitt Merle, MD on 02/26/2020 Total positive nodes: 0   03/08/2020 Genetic Testing   Negative hereditary cancer genetic testing: no  pathogenic variants detected in [facility] Common Hereditary Cancers Panel with preliminary evidence pancreatic cancer genes.  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 The report date is March 08, 2020.    The Common Hereditary Cancers Panel with preliminary evidence pancreatic cancer genes offered by [facility] includes sequencing and/or deletion duplication testing of the following 49 genes: APC, ATM, AXIN2, BARD1, BMPR1A, BRCA1, BRCA2, BRIP1, CDH1, CDK4, CDKN2A (p14ARF), CDKN2A (p16INK4a), CHEK2, CTNNA1, DICER1, EPCAM (Deletion/duplication testing only), GREM1 (promoter region deletion/duplication testing only), FANCC, GREM1, HOXB13, KIT, MEN1, MLH1, MSH2, MSH3, MSH6, MUTYH, NBN, NF1, NHTL1, PALB2, PALLD, PDGFRA, PMS2, POLD1, POLE, PTEN, RAD50, RAD51C, RAD51D, SDHA, SDHB, SDHC, SDHD, SMAD4, SMARCA4. STK11, TP53, TSC1, TSC2, and VHL.  The following genes were evaluated for sequence changes only: SDHA and HOXB13 c.251G>A variant only.es only: SDHA and HOXB13 c.251G>A variant only.   05/03/2020 Imaging   CT AP  IMPRESSION: 1. Interval decrease in size of a hypodense mass of the central pancreatic tail, consistent with treatment response of primary pancreatic malignancy. 2. Numerous low-attenuation liver lesions are seen, increased in size and number compared to prior examination. Findings are consistent with worsened hepatic metastatic disease. 3. A previously queried omental nodule adjacent to the transverse colon is more clearly a prominent diverticulum on current examination.   Aortic Atherosclerosis (ICD10-I70.0).   06/02/2020 -  Chemotherapy   Second-line FOLFIRI q2weeks starting 06/02/20  CURRENT THERAPY:  Second-line FOLFIRI q2weeks starting 06/02/20.   INTERVAL HISTORY:  Mariah Henderson is here for a follow up of metastatic pancreatic cancer. She was last seen by me on 09/08/20. She presents to the clinic accompanied by her sister.  She is  doing well overall Neuropathy is stable  Appetite is good, but still lost a few more lbs  She lives independently  No pain or nausea     All other systems were reviewed with the patient and are negative.  MEDICAL HISTORY:  Past Medical History:  Diagnosis Date   Acute medial meniscus tear    right knee   Anxiety    Asthma    Cancer ([place])    Cervical spondylosis    Chronic serous otitis media    Constipation    COPD (chronic obstructive pulmonary disease) (HCC)    Depression    followed by [name]; no longer of depakote, seroquel   Diabetes mellitus without complication ([place])    Dysuria 12/27/2009   Qualifier: Diagnosis of  By: [name] MD, Amanjot     Foot drop    GERD (gastroesophageal reflux disease)    Heart murmur    Hyperlipidemia    Hypertension    Low back pain    Obesity    Paraumbilical hernia    Pre-diabetes     SURGICAL HISTORY: Past Surgical History:  Procedure Laterality Date   ABDOMINAL HYSTERECTOMY     ANTERIOR CERVICAL DECOMP/DISCECTOMY FUSION N/A 10/05/2014   Procedure: ACDF - C5-C6 - C6-C7;  Surgeon: Karie Chimera, MD;  Location: [facility];  Service: Neurosurgery;  Laterality: N/A;  ACDF - C5-C6 - C6-C7   CHOLECYSTECTOMY     COLONOSCOPY     FOOT SURGERY     HERNIA REPAIR  1999   IR IMAGING GUIDED PORT INSERTION  02/25/2020   IR US GUIDANCE  01/28/2020   KNEE ARTHROSCOPY WITH MEDIAL MENISECTOMY Right 06/14/2016   Procedure: RIGHT KNEE ARTHROSCOPY WITH PARTIAL MEDIAL MENISCECTOMY, SUBCHONDROPLASTY;  Surgeon: Leandrew Koyanagi, MD;  Location: [facility];  Service: Orthopedics;  Laterality: Right;   KNEE ARTHROSCOPY WITH SUBCHONDROPLASTY Right 06/14/2016   Procedure: KNEE ARTHROSCOPY WITH SUBCHONDROPLASTY;  Surgeon: Leandrew Koyanagi, MD;  Location: [place];  Service: Orthopedics;  Laterality: Right;   RADIOACTIVE SEED GUIDED EXCISIONAL BREAST BIOPSY Left 06/23/2014   Procedure: RADIOACTIVE SEED GUIDED EXCISIONAL BREAST BIOPSY;   Surgeon: Stark Klein, MD;  Location: [facility];  Service: General;  Laterality: Left;    I have reviewed the social history and family history with the patient and they are unchanged from previous note.  ALLERGIES:  has No Known Allergies.  MEDICATIONS:  Current Outpatient Medications  Medication Sig Dispense Refill   rivaroxaban (XARELTO) 20 MG TABS tablet Take 1 tablet (20 mg total) by mouth daily with supper. 30 tablet 3   albuterol (PROVENTIL) (2.5 MG/3ML) 0.083% nebulizer solution Take 3 mLs (2.5 mg total) by nebulization every 6 (six) hours as needed for wheezing. 1080 mL 3   albuterol (VENTOLIN HFA) 108 (90 Base) MCG/ACT inhaler INHALE 1 TO 2 PUFFS INTO THE LUNGS EVERY 6 HOURS AS NEEDED FOR WHEEZING OR SHORTNESS OF BREATH 54 g 1   alendronate (FOSAMAX) 70 MG tablet TAKE 1 TABLET BY MOUTH EVERY 7 DAYS. TAKE WITH A FULL GLASS OF WATER ON AN EMPTY STOMACH. 12 tablet 3   atorvastatin (LIPITOR) 40 MG tablet TAKE 1 TABLET BY MOUTH EVERY DAY 90 tablet 3  benazepril (LOTENSIN) 20 MG tablet TAKE 1 TABLET(20 MG) BY MOUTH DAILY 90 tablet 3   calcium carbonate (OS-CAL) 600 MG TABS tablet Take by mouth.     celecoxib (CELEBREX) 100 MG capsule TAKE 1 CAPSULE BY MOUTH  TWICE DAILY 180 capsule 3   cholecalciferol (VITAMIN D) 1000 units tablet Take 2,000 Units by mouth daily.     diclofenac Sodium (VOLTAREN) 1 % GEL Apply 2 g topically 4 (four) times daily. 100 g 2   famotidine (PEPCID) 40 MG tablet TAKE 1/2 TABLET(20 MG) BY MOUTH TWICE DAILY 30 tablet 3   fluticasone (FLONASE) 50 MCG/ACT nasal spray Place 1 spray into both nostrils daily. 16 g 3   Fluticasone-Salmeterol (ADVAIR DISKUS) 100-50 MCG/DOSE AEPB Inhale 1 puff into the lungs 2 (two) times daily. 60 each 11   furosemide (LASIX) 20 MG tablet Take 1 tablet by mouth once daily on day of chemo and while 5FU infusing, then take 1/2 tab (10 mg) by mouth daily 30 tablet 1   gabapentin (NEURONTIN) 100 MG capsule Take 1 capsule (100  mg total) by mouth 3 (three) times daily. 90 capsule 1   glipiZIDE (GLUCOTROL XL) 2.5 MG 24 hr tablet TAKE 1 TABLET(2.5 MG) BY MOUTH DAILY WITH BREAKFAST 30 tablet 2   glucose blood (ONETOUCH VERIO) test strip Use as instructed 100 each 12   hydrochlorothiazide (MICROZIDE) 12.5 MG capsule TAKE 1 CAPSULE(12.5 MG) BY MOUTH DAILY 90 capsule 1   HYDROcodone-acetaminophen (NORCO/VICODIN) 5-325 MG tablet Take 1 tablet by mouth 2 (two) times daily as needed for severe pain. 60 tablet 0   ipratropium (ATROVENT) 0.02 % nebulizer solution Take 0.5 mg by nebulization every 6 (six) hours as needed for wheezing or shortness of breath.     Lancets (ONETOUCH ULTRASOFT) lancets Use as instructed 100 each 12   latanoprost (XALATAN) 0.005 % ophthalmic solution PLACE 1 DROP INTO BOTH EYES AT BEDTIME  0   loperamide (LOPERAMIDE A-D) 2 MG tablet Take 1 tablet (2 mg total) by mouth 2 (two) times daily as needed for diarrhea or loose stools. 6 tablet 0   loratadine (CLARITIN) 10 MG tablet Take 1 tablet (10 mg total) by mouth daily. 30 tablet 11   magic mouthwash w/lidocaine SOLN Take 5 mLs by mouth 4 (four) times daily as needed for mouth pain. 480 mL 2   meclizine (ANTIVERT) 12.5 MG tablet TAKE 1 TABLET(12.5 MG) BY MOUTH TWICE DAILY AS NEEDED FOR DIZZINESS 15 tablet 3   potassium chloride SA (KLOR-CON) 20 MEQ tablet TAKE 1 TABLET(20 MEQ) BY MOUTH THREE TIMES DAILY 90 tablet 0   prochlorperazine (COMPAZINE) 10 MG tablet TAKE 1 TABLET(10 MG) BY MOUTH EVERY 6 HOURS AS NEEDED FOR NAUSEA OR VOMITING 30 tablet 3   risperiDONE (RISPERDAL) 0.25 MG tablet Take 0.25 mg by mouth daily as needed.     sertraline (ZOLOFT) 50 MG tablet Take 50 mg by mouth daily.     traZODone (DESYREL) 50 MG tablet Take 25-50 mg by mouth at bedtime as needed for sleep.      triamcinolone cream (KENALOG) 0.1 % apply to affected area twice a day 454 g 1   No current facility-administered medications for this visit.   Facility-Administered Medications  Ordered in Other Visits  Medication Dose Route Frequency Provider Last Rate Last Admin   heparin lock flush 100 unit/mL  500 Units Intracatheter Once PRN Truitt Merle, MD       sodium chloride flush (NS) 0.9 % injection 10 mL  10 mL Intracatheter PRN Truitt Merle, MD        PHYSICAL EXAMINATION: ECOG PERFORMANCE STATUS: 1 - Symptomatic but completely ambulatory  Vitals:   09/22/20 1014  BP: 135/65  Pulse: 72  Resp: 17  Temp: 98.2 F (36.8 C)  SpO2: 100%   Wt Readings from Last 3 Encounters:  09/22/20 141 lb 9.6 oz (64.2 kg)  09/08/20 146 lb 3.2 oz (66.3 kg)  08/25/20 142 lb 8 oz (64.6 kg)     GENERAL:alert, no distress and comfortable SKIN: skin color normal, no rashes or significant lesions EYES: normal, Conjunctiva are pink and non-injected, sclera clear  NEURO: alert & oriented x 3 with fluent speech  LABORATORY DATA:  I have reviewed the data as listed CBC Latest Ref Rng & Units 09/22/2020 09/08/2020 08/25/2020  WBC 4.0 - 10.5 K/uL 4.2 5.2 4.9  Hemoglobin 12.0 - 15.0 g/dL 10.9(L) 11.2(L) 11.9(L)  Hematocrit 36.0 - 46.0 % 33.3(L) 35.1(L) 35.9(L)  Platelets 150 - 400 K/uL 144(L) 141(L) 142(L)     CMP Latest Ref Rng & Units 09/22/2020 09/08/2020 08/25/2020  Glucose 70 - 99 mg/dL 152(H) 197(H) 142(H)  BUN 8 - 23 mg/dL '11 14 9  ' Creatinine 0.44 - 1.00 mg/dL 0.82 0.79 0.88  Sodium 135 - 145 mmol/L 140 140 140  Potassium 3.5 - 5.1 mmol/L 3.8 3.6 3.8  Chloride 98 - 111 mmol/L 103 104 104  CO2 22 - 32 mmol/L '27 28 27  ' Calcium 8.9 - 10.3 mg/dL 9.5 8.8(L) 9.5  Total Protein 6.5 - 8.1 g/dL 6.3(L) 6.1(L) 6.4(L)  Total Bilirubin 0.3 - 1.2 mg/dL 0.7 0.5 0.5  Alkaline Phos 38 - 126 U/L 125 159(H) 161(H)  AST 15 - 41 U/L 35 34 35  ALT 0 - 44 U/L '31 26 26      ' RADIOGRAPHIC STUDIES: I have personally reviewed the radiological images as listed and agreed with the findings in the report. No results found.    No problem-specific Assessment & Plan notes found for this encounter.   No  orders of the defined types were placed in this encounter.  All questions were answered. The patient knows to call the clinic with any problems, questions or concerns. No barriers to learning was detected. The total time spent in the appointment was 30 minutes.     Truitt Merle, MD 09/22/2020   I, Wilburn Mylar, am acting as scribe for Truitt Merle, MD.   I have reviewed the above documentation for accuracy and completeness, and I agree with the above.

## 2020-09-22 NOTE — Patient Instructions (Signed)
The chemotherapy medication bag should finish at 46 hours, 96 hours, or 7 days. For example, if your pump is scheduled for 46 hours and it was put on at 4:00 p.m., it should finish at 2:00 p.m. the day it is scheduled to come off regardless of your appointment time.     Estimated time to finish at 1:15 on 09/24/20.   If the display on your pump reads "Low Volume" and it is beeping, take the batteries out of the pump and come to the cancer center for it to be taken off.   If the pump alarms go off prior to the pump reading "Low Volume" then call [phone] and someone can assist you.  If the plunger comes out and the chemotherapy medication is leaking out, please use your home chemo spill kit to clean up the spill. Do NOT use paper towels or other household products.  If you have problems or questions regarding your pump, please call either [phone] (24 hours a day) or the cancer center Monday-Friday 8:00 a.m.- 4:30 p.m. at the clinic number and we will assist you. If you are unable to get assistance, then go to the nearest Emergency Department and ask the staff to contact the IV team for assistance.

## 2020-09-24 ENCOUNTER — Inpatient Hospital Stay: Payer: Medicare Other

## 2020-09-24 ENCOUNTER — Other Ambulatory Visit: Payer: Self-pay | Admitting: Hematology

## 2020-09-24 ENCOUNTER — Other Ambulatory Visit: Payer: Self-pay

## 2020-09-24 VITALS — BP 128/66 | HR 79 | Temp 98.9°F | Resp 16

## 2020-09-24 DIAGNOSIS — C252 Malignant neoplasm of tail of pancreas: Secondary | ICD-10-CM | POA: Diagnosis not present

## 2020-09-24 DIAGNOSIS — Z452 Encounter for adjustment and management of vascular access device: Secondary | ICD-10-CM | POA: Diagnosis not present

## 2020-09-24 DIAGNOSIS — Z7189 Other specified counseling: Secondary | ICD-10-CM

## 2020-09-24 DIAGNOSIS — C787 Secondary malignant neoplasm of liver and intrahepatic bile duct: Secondary | ICD-10-CM | POA: Diagnosis not present

## 2020-09-24 DIAGNOSIS — Z5111 Encounter for antineoplastic chemotherapy: Secondary | ICD-10-CM | POA: Diagnosis not present

## 2020-09-24 MED ORDER — HEPARIN SOD (PORK) LOCK FLUSH 100 UNIT/ML IV SOLN
500.0000 [IU] | Freq: Once | INTRAVENOUS | Status: AC | PRN
  Administered 2020-09-24: 500 [IU]

## 2020-09-24 MED ORDER — SODIUM CHLORIDE 0.9% FLUSH
10.0000 mL | INTRAVENOUS | Status: DC | PRN
  Administered 2020-09-24: 10 mL

## 2020-09-25 ENCOUNTER — Other Ambulatory Visit: Payer: Self-pay | Admitting: Hematology

## 2020-09-25 ENCOUNTER — Encounter: Payer: Self-pay | Admitting: Hematology

## 2020-10-02 ENCOUNTER — Other Ambulatory Visit: Payer: Self-pay | Admitting: Internal Medicine

## 2020-10-03 DIAGNOSIS — C252 Malignant neoplasm of tail of pancreas: Secondary | ICD-10-CM | POA: Diagnosis not present

## 2020-10-05 ENCOUNTER — Other Ambulatory Visit: Payer: Self-pay | Admitting: Hematology

## 2020-10-05 MED FILL — Dexamethasone Sodium Phosphate Inj 100 MG/10ML: INTRAMUSCULAR | Qty: 1 | Status: AC

## 2020-10-05 NOTE — Progress Notes (Signed)
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 Suggestion of 3 vascular hypoechoic mass within the liver measuring up to 2.5 cm. 2. Suggestion of a hypoechoic mass within the tail of the pancreas that is not well visualized. 3. Findings concerning for malignancy, please see separately dictated CT abdomen pelvis 01/13/2020.   01/13/2020 Imaging   CT AP 01/13/20  IMPRESSION: 1. Hypoenhancing 2.0 cm in long axis mass in the pancreatic tail compatible with pancreatic adenocarcinoma. The mass abuts the posterior gastric wall but without definite gastric invasion. The splenic vein appears narrowed/attenuated compared to prior exams and the lesion abuts the margin of the splenic artery. 2. Hypoenhancing hepatic masses are new compared to 11/22/2017 and are highly suspicious for metastatic lesions. 3. Questionable 0.8 by 0.8 cm omental tumor nodule versus dense diverticulum above the transverse colon. 4. Other imaging findings of potential clinical significance: Mild distal esophageal wall thickening, query esophagitis. Stable left adrenal adenoma. Sigmoid colon diverticulosis. Lumbar and thoracic spondylosis and degenerative disc disease  contributing to multilevel impingement. 5. Aortic atherosclerosis. Aortic Atherosclerosis (ICD10-I70.0).   01/21/2020 Initial Diagnosis   Pancreatic cancer ([place])   01/28/2020 Initial Biopsy   FINAL MICROSCOPIC DIAGNOSIS:   A. LIVER, NEEDLE CORE BIOPSY:  - Adenocarcinoma.   COMMENT:   Immunohistochemistry for CK7 is positive.  CDX-2 demonstrates weak to  moderate positive staining.  TTF-1 and PAX 8 demonstrate weak, likely  nonspecific staining.  CK20, GATA-3 and ER are negative.  The  morphologic and immunophenotypic characteristics are compatible with the  clinical impression of a primary pancreatic cancer.  Radiologic  correlation is encouraged.  If applicable, there is likely sufficient  tissue for ancillary studies (Block A2).  Dr. Vic Ripper reviewed the  case.    02/26/2020 - 05/19/2020 Chemotherapy   First-line Gemcitabine and Abraxane every 2 weeks starting 02/26/20. D/c after 05/19/20 due to disease progression in liver and neuropathy     02/26/2020 Cancer Staging   Staging form: Exocrine Pancreas, AJCC 8th Edition - Clinical: Stage IV (cT1c, cN0, pM1) - Signed by Truitt Merle, MD on 02/26/2020 Total positive nodes: 0   03/08/2020 Genetic Testing   Negative hereditary cancer genetic testing: no pathogenic variants detected in [facility] Common Hereditary Cancers Panel with preliminary evidence pancreatic cancer genes.  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 The report date is March 08, 2020.    The Common Hereditary Cancers Panel with preliminary evidence pancreatic cancer genes offered by [facility] includes sequencing and/or deletion duplication testing of the following 49 genes: APC, ATM, AXIN2, BARD1, BMPR1A, BRCA1, BRCA2, BRIP1, CDH1, CDK4, CDKN2A (p14ARF), CDKN2A (p16INK4a), CHEK2, CTNNA1, DICER1, EPCAM (Deletion/duplication testing only), GREM1 (promoter region deletion/duplication testing only), FANCC, GREM1, HOXB13, KIT, MEN1, MLH1, MSH2,  MSH3, MSH6, MUTYH, NBN, NF1, NHTL1, PALB2, PALLD, PDGFRA, PMS2, POLD1,  POLE, PTEN, RAD50, RAD51C, RAD51D, SDHA, SDHB, SDHC, SDHD, SMAD4, SMARCA4. STK11, TP53, TSC1, TSC2, and VHL.  The following genes were evaluated for sequence changes only: SDHA and HOXB13 c.251G>A variant only.es only: SDHA and HOXB13 c.251G>A variant only.   05/03/2020 Imaging   CT AP  IMPRESSION: 1. Interval decrease in size of a hypodense mass of the central pancreatic tail, consistent with treatment response of primary pancreatic malignancy. 2. Numerous low-attenuation liver lesions are seen, increased in size and number compared to prior examination. Findings are consistent with worsened hepatic metastatic disease. 3. A previously queried omental nodule adjacent to the transverse colon is more clearly a prominent diverticulum on current examination.   Aortic Atherosclerosis (ICD10-I70.0).   06/02/2020 -  Chemotherapy   Second-line FOLFIRI q2weeks starting 06/02/20      CURRENT THERAPY: FOLFIRI every 2 weeks  INTERVAL HISTORY: [name] returns for follow-up and treatment as scheduled.  She was last seen by Dr. Burr Medico 09/22/2020 and completed cycle 9 FOLFIRI.  She is doing well with normal energy and appetite.  She has a bowel movement after meals, consistency depends on what she eats.  She has no specific complaints except neuropathy in her fingertips and weakness in her hands which is constant.  She requires help to open bottles, fasten shoes, and is dropping things.  This is stable lately but bothersome.  She has been on gabapentin 3 times daily for a few weeks without improvement.  Denies any other specific complaints such as new or worsening abdominal pain, bloating, N/V/D, fever, chills, cough, chest pain, dyspnea, or worsening edema.   MEDICAL HISTORY:  Past Medical History:  Diagnosis Date   Acute medial meniscus tear    right knee   Anxiety    Asthma    Cancer ([place])    Cervical spondylosis    Chronic  serous otitis media    Constipation    COPD (chronic obstructive pulmonary disease) (HCC)    Depression    followed by [name]; no longer of depakote, seroquel   Diabetes mellitus without complication ([place])    Dysuria 12/27/2009   Qualifier: Diagnosis of  By: [name] MD, Amanjot     Foot drop    GERD (gastroesophageal reflux disease)    Heart murmur    Hyperlipidemia    Hypertension    Low back pain    Obesity    Paraumbilical hernia    Pre-diabetes     SURGICAL HISTORY: Past Surgical History:  Procedure Laterality Date   ABDOMINAL HYSTERECTOMY     ANTERIOR CERVICAL DECOMP/DISCECTOMY FUSION N/A 10/05/2014   Procedure: ACDF - C5-C6 - C6-C7;  Surgeon: Karie Chimera, MD;  Location: [place] [facility];  Service: Neurosurgery;  Laterality: N/A;  ACDF - C5-C6 - C6-C7   CHOLECYSTECTOMY     COLONOSCOPY     FOOT SURGERY     HERNIA REPAIR  1999   IR IMAGING GUIDED PORT INSERTION  02/25/2020   IR US GUIDANCE  01/28/2020   KNEE ARTHROSCOPY WITH MEDIAL MENISECTOMY Right 06/14/2016   Procedure: RIGHT KNEE ARTHROSCOPY WITH PARTIAL MEDIAL MENISCECTOMY, SUBCHONDROPLASTY;  Surgeon: Leandrew Koyanagi, MD;  Location: [facility];  Service: Orthopedics;  Laterality: Right;   KNEE ARTHROSCOPY WITH SUBCHONDROPLASTY Right 06/14/2016   Procedure: KNEE ARTHROSCOPY WITH SUBCHONDROPLASTY;  Surgeon: Leandrew Koyanagi, MD;  Location: [place] [facility];  Service: Orthopedics;  Laterality: Right;   RADIOACTIVE SEED GUIDED EXCISIONAL BREAST BIOPSY Left 06/23/2014   Procedure: RADIOACTIVE SEED GUIDED EXCISIONAL BREAST  BIOPSY;  Surgeon: Stark Klein, MD;  Location: [facility];  Service: General;  Laterality: Left;    I have reviewed the social history and family history with the patient and they are unchanged from previous note.  ALLERGIES:  has No Known Allergies.  MEDICATIONS:  Current Outpatient Medications  Medication Sig Dispense Refill   ONETOUCH ULTRA test strip TEST BLOOD SUGAR  ONCE DAILY 100 strip 3   albuterol (PROVENTIL) (2.5 MG/3ML) 0.083% nebulizer solution Take 3 mLs (2.5 mg total) by nebulization every 6 (six) hours as needed for wheezing. 1080 mL 3   albuterol (VENTOLIN HFA) 108 (90 Base) MCG/ACT inhaler INHALE 1 TO 2 PUFFS INTO THE LUNGS EVERY 6 HOURS AS NEEDED FOR WHEEZING OR SHORTNESS OF BREATH 54 g 1   alendronate (FOSAMAX) 70 MG tablet TAKE 1 TABLET BY MOUTH EVERY 7 DAYS. TAKE WITH A FULL GLASS OF WATER ON AN EMPTY STOMACH. 12 tablet 3   atorvastatin (LIPITOR) 40 MG tablet TAKE 1 TABLET BY MOUTH EVERY DAY 90 tablet 3   benazepril (LOTENSIN) 20 MG tablet TAKE 1 TABLET(20 MG) BY MOUTH DAILY 90 tablet 3   calcium carbonate (OS-CAL) 600 MG TABS tablet Take by mouth.     celecoxib (CELEBREX) 100 MG capsule TAKE 1 CAPSULE BY MOUTH  TWICE DAILY 180 capsule 3   cholecalciferol (VITAMIN D) 1000 units tablet Take 2,000 Units by mouth daily.     diclofenac Sodium (VOLTAREN) 1 % GEL Apply 2 g topically 4 (four) times daily. 100 g 2   famotidine (PEPCID) 40 MG tablet TAKE 1/2 TABLET(20 MG) BY MOUTH TWICE DAILY 30 tablet 3   fluticasone (FLONASE) 50 MCG/ACT nasal spray Place 1 spray into both nostrils daily. 16 g 3   Fluticasone-Salmeterol (ADVAIR DISKUS) 100-50 MCG/DOSE AEPB Inhale 1 puff into the lungs 2 (two) times daily. 60 each 11   furosemide (LASIX) 20 MG tablet TAKE 1 TABLET BY MOUTH DAILY ON DAY OF CHEMO AND WHILE 5FU INFUSING. THEN TAKE 1/2 TABLET BY MOUTH DAILY 30 tablet 1   gabapentin (NEURONTIN) 100 MG capsule Take 1 capsule (100 mg total) by mouth 3 (three) times daily. 90 capsule 1   glipiZIDE (GLUCOTROL XL) 2.5 MG 24 hr tablet TAKE 1 TABLET(2.5 MG) BY MOUTH DAILY WITH BREAKFAST 30 tablet 2   hydrochlorothiazide (MICROZIDE) 12.5 MG capsule TAKE 1 CAPSULE(12.5 MG) BY MOUTH DAILY 90 capsule 1   HYDROcodone-acetaminophen (NORCO/VICODIN) 5-325 MG tablet Take 1 tablet by mouth 2 (two) times daily as needed for severe pain. 60 tablet 0   ipratropium (ATROVENT)  0.02 % nebulizer solution Take 0.5 mg by nebulization every 6 (six) hours as needed for wheezing or shortness of breath.     Lancets (ONETOUCH ULTRASOFT) lancets Use as instructed 100 each 12   latanoprost (XALATAN) 0.005 % ophthalmic solution PLACE 1 DROP INTO BOTH EYES AT BEDTIME  0   loperamide (LOPERAMIDE A-D) 2 MG tablet Take 1 tablet (2 mg total) by mouth 2 (two) times daily as needed for diarrhea or loose stools. 6 tablet 0   loratadine (CLARITIN) 10 MG tablet Take 1 tablet (10 mg total) by mouth daily. 30 tablet 11   magic mouthwash w/lidocaine SOLN Take 5 mLs by mouth 4 (four) times daily as needed for mouth pain. 480 mL 2   meclizine (ANTIVERT) 12.5 MG tablet TAKE 1 TABLET(12.5 MG) BY MOUTH TWICE DAILY AS NEEDED FOR DIZZINESS 15 tablet 3   potassium chloride SA (KLOR-CON) 20 MEQ tablet TAKE 1 TABLET  BY MOUTH THREE TIMES DAILY 270 tablet 0   prochlorperazine (COMPAZINE) 10 MG tablet TAKE 1 TABLET(10 MG) BY MOUTH EVERY 6 HOURS AS NEEDED FOR NAUSEA OR VOMITING 30 tablet 3   risperiDONE (RISPERDAL) 0.25 MG tablet Take 0.25 mg by mouth daily as needed.     rivaroxaban (XARELTO) 20 MG TABS tablet Take 1 tablet (20 mg total) by mouth daily with supper. 30 tablet 3   sertraline (ZOLOFT) 50 MG tablet Take 50 mg by mouth daily.     traZODone (DESYREL) 50 MG tablet Take 25-50 mg by mouth at bedtime as needed for sleep.      triamcinolone cream (KENALOG) 0.1 % apply to affected area twice a day 454 g 1   No current facility-administered medications for this visit.   Facility-Administered Medications Ordered in Other Visits  Medication Dose Route Frequency Provider Last Rate Last Admin   heparin lock flush 100 unit/mL  500 Units Intracatheter Once PRN Truitt Merle, MD       sodium chloride flush (NS) 0.9 % injection 10 mL  10 mL Intracatheter PRN Truitt Merle, MD        PHYSICAL EXAMINATION: ECOG PERFORMANCE STATUS: 1 - Symptomatic but completely ambulatory  Vitals:   10/06/20 0950  BP: (!) 122/58   Pulse: 75  Resp: 18  Temp: 98.3 F (36.8 C)  SpO2: 100%   Filed Weights   10/06/20 0950  Weight: 143 lb 12.8 oz (65.2 kg)    GENERAL:alert, no distress and comfortable SKIN: No rash EYES:  sclera clear LUNGS: with normal breathing effort HEART: Bilateral pedal edema NEURO: alert & oriented x 3 with fluent speech, no focal motor deficits.  Intact peripheral vibratory sense over the fingertips, decreased in the wrist per tuning fork exam PAC without erythema  LABORATORY DATA:  I have reviewed the data as listed CBC Latest Ref Rng & Units 10/06/2020 09/22/2020 09/08/2020  WBC 4.0 - 10.5 K/uL 4.4 4.2 5.2  Hemoglobin 12.0 - 15.0 g/dL 10.7(L) 10.9(L) 11.2(L)  Hematocrit 36.0 - 46.0 % 32.7(L) 33.3(L) 35.1(L)  Platelets 150 - 400 K/uL 143(L) 144(L) 141(L)     CMP Latest Ref Rng & Units 10/06/2020 09/22/2020 09/08/2020  Glucose 70 - 99 mg/dL 253(H) 152(H) 197(H)  BUN 8 - 23 mg/dL '14 11 14  ' Creatinine 0.44 - 1.00 mg/dL 0.85 0.82 0.79  Sodium 135 - 145 mmol/L 139 140 140  Potassium 3.5 - 5.1 mmol/L 3.9 3.8 3.6  Chloride 98 - 111 mmol/L 104 103 104  CO2 22 - 32 mmol/L '26 27 28  ' Calcium 8.9 - 10.3 mg/dL 9.0 9.5 8.8(L)  Total Protein 6.5 - 8.1 g/dL 6.3(L) 6.3(L) 6.1(L)  Total Bilirubin 0.3 - 1.2 mg/dL 0.6 0.7 0.5  Alkaline Phos 38 - 126 U/L 137(H) 125 159(H)  AST 15 - 41 U/L 37 35 34  ALT 0 - 44 U/L '24 31 26      ' RADIOGRAPHIC STUDIES: I have personally reviewed the radiological images as listed and agreed with the findings in the report. No results found.   ASSESSMENT & PLAN: Mariah Henderson is a 70 y.o. female with   1. Pancreatic cancer with Liver metastasis, stage IV  -Her CT AP from 01/13/20 showed 2cm mass on tail of pancreas abutting but not invading the gastric wall, with likely liver and omental metastasis.  -Her liver biopsy from 01/28/20 showed adenocarcinoma from pancreatic primary. Her 02/11/20 CT Chest showed no metastasis  -She began first-line palliative chemo with  gemcitabine and  Abraxane every 2 weeks beginning 02/26/20, she progressed in the liver  - changed to second line liposomal irinotecan/leuc and 5FU beginning 06/02/20. Dose reduced for cycle 1 -CT CAP 08/24/2020 showed improved liver metastasis, no new or progressive disease.  Continue current chemo.  Plan to repeat scan in early November -Tolerating treatment well with neuropathy, no other significant side effects   2. Abdominal Pain, low appetite, 10 lbs weight loss, Transmanitis -She had abdominal pain for the month prior to cancer diagnosis. She is on Norco 5-358m BID as needed. her pain has improved since chemo  -She had sudden loss of appetite and weight loss since October 2021. -On Chemo her appetite has increased, gaining weight and eating more. -denies pain   3. Comorbidities: Asthma, Borderline Diabetic, GERD, Heart murmur, HLD, HTN, arthritis, neuropathy  -On medications, including multiple diuretics. -Managed by PCP.   -compression stockings for leg edema  -she had pre-existing neuropathy prior to treatment, worsened on chemo    4. Social support -She lives alone in 1 level house and she is retired. She has 1 adult son [place] in [place] She has a twin and 3 other sisters who [place] and other siblings and relative [place]  -Her son will help her at home  -She has approved for grant to help with medication. Her sister will notify [place] type of grant.    5. Cytopenias -secondary to chemotherapy   -stable   6. Goal of care discussion -The patient understands the goal of care is palliative. -currently full code    7. Hypokalemia secondary to diuretic -She is on Lasix and dieretics for her LE edema and heart.  -continue oral K TID -she has worsening leg edema. Doppler 05/19/20 negative for DVT -new Rx 06/16/20: lasix 20 mg po once daily on days 1-3 of chemo, then 10 mg daily    8. Genetic testing from 02/19/20 was negative for pathogenetic mutation with VUS of gene  NF1  Disposition: Ms. SJuengerappears stable.  She completed 9 cycles of FOLFIRI/liposomal irinotecan.  She tolerates treatment very well without significant toxicities except neuropathy.  She has functional difficulties and sensory deficit, she has failed gabapentin.  I recommend a referral to neuro oncologist Dr. VMickeal Skinnerfor further eval and management, she agrees and I referred her.  She is otherwise doing well with good performance status, no clinical evidence of disease progression.  Labs reviewed, adequate to proceed with cycle 10 FOLFIRI/liposomal irinotecan today as planned.  Follow-up in 2 weeks with cycle 11.  Plan to scan in early November, or sooner if needed.  Orders Placed This Encounter  Procedures   Amb Referral to Neuro Oncology    Referral Priority:   Urgent    Referral Type:   Consultation    Referral Reason:   Specialty Services Required    Requested Specialty:   Oncology    Number of Visits Requested:   1    All questions were answered. The patient knows to call the clinic with any problems, questions or concerns. No barriers to learning were detected.     LAlla Feeling NP 10/06/20

## 2020-10-06 ENCOUNTER — Telehealth: Payer: Self-pay | Admitting: Internal Medicine

## 2020-10-06 ENCOUNTER — Encounter: Payer: Self-pay | Admitting: Nurse Practitioner

## 2020-10-06 ENCOUNTER — Inpatient Hospital Stay: Payer: Medicare Other

## 2020-10-06 ENCOUNTER — Other Ambulatory Visit: Payer: Self-pay

## 2020-10-06 ENCOUNTER — Inpatient Hospital Stay (HOSPITAL_BASED_OUTPATIENT_CLINIC_OR_DEPARTMENT_OTHER): Payer: Medicare Other | Admitting: Nurse Practitioner

## 2020-10-06 VITALS — BP 122/58 | HR 75 | Temp 98.3°F | Resp 18 | Ht 60.0 in | Wt 143.8 lb

## 2020-10-06 DIAGNOSIS — Z7189 Other specified counseling: Secondary | ICD-10-CM

## 2020-10-06 DIAGNOSIS — G629 Polyneuropathy, unspecified: Secondary | ICD-10-CM

## 2020-10-06 DIAGNOSIS — Z452 Encounter for adjustment and management of vascular access device: Secondary | ICD-10-CM | POA: Diagnosis not present

## 2020-10-06 DIAGNOSIS — C252 Malignant neoplasm of tail of pancreas: Secondary | ICD-10-CM

## 2020-10-06 DIAGNOSIS — Z95828 Presence of other vascular implants and grafts: Secondary | ICD-10-CM

## 2020-10-06 DIAGNOSIS — C787 Secondary malignant neoplasm of liver and intrahepatic bile duct: Secondary | ICD-10-CM | POA: Diagnosis not present

## 2020-10-06 DIAGNOSIS — Z5111 Encounter for antineoplastic chemotherapy: Secondary | ICD-10-CM | POA: Diagnosis not present

## 2020-10-06 LAB — CBC WITH DIFFERENTIAL (CANCER CENTER ONLY)
Abs Immature Granulocytes: 0.01 10*3/uL (ref 0.00–0.07)
Basophils Absolute: 0 10*3/uL (ref 0.0–0.1)
Basophils Relative: 1 %
Eosinophils Absolute: 0.4 10*3/uL (ref 0.0–0.5)
Eosinophils Relative: 8 %
HCT: 32.7 % — ABNORMAL LOW (ref 36.0–46.0)
Hemoglobin: 10.7 g/dL — ABNORMAL LOW (ref 12.0–15.0)
Immature Granulocytes: 0 %
Lymphocytes Relative: 8 %
Lymphs Abs: 0.4 10*3/uL — ABNORMAL LOW (ref 0.7–4.0)
MCH: 30.9 pg (ref 26.0–34.0)
MCHC: 32.7 g/dL (ref 30.0–36.0)
MCV: 94.5 fL (ref 80.0–100.0)
Monocytes Absolute: 0.7 10*3/uL (ref 0.1–1.0)
Monocytes Relative: 15 %
Neutro Abs: 2.9 10*3/uL (ref 1.7–7.7)
Neutrophils Relative %: 68 %
Platelet Count: 143 10*3/uL — ABNORMAL LOW (ref 150–400)
RBC: 3.46 MIL/uL — ABNORMAL LOW (ref 3.87–5.11)
RDW: 18.3 % — ABNORMAL HIGH (ref 11.5–15.5)
WBC Count: 4.4 10*3/uL (ref 4.0–10.5)
nRBC: 0 % (ref 0.0–0.2)

## 2020-10-06 LAB — CMP (CANCER CENTER ONLY)
ALT: 24 U/L (ref 0–44)
AST: 37 U/L (ref 15–41)
Albumin: 3.1 g/dL — ABNORMAL LOW (ref 3.5–5.0)
Alkaline Phosphatase: 137 U/L — ABNORMAL HIGH (ref 38–126)
Anion gap: 9 (ref 5–15)
BUN: 14 mg/dL (ref 8–23)
CO2: 26 mmol/L (ref 22–32)
Calcium: 9 mg/dL (ref 8.9–10.3)
Chloride: 104 mmol/L (ref 98–111)
Creatinine: 0.85 mg/dL (ref 0.44–1.00)
GFR, Estimated: >60 mL/min (ref >60)
Glucose, Bld: 253 mg/dL — ABNORMAL HIGH (ref 70–99)
Potassium: 3.9 mmol/L (ref 3.5–5.1)
Sodium: 139 mmol/L (ref 135–145)
Total Bilirubin: 0.6 mg/dL (ref 0.3–1.2)
Total Protein: 6.3 g/dL — ABNORMAL LOW (ref 6.5–8.1)

## 2020-10-06 MED ORDER — SODIUM CHLORIDE 0.9 % IV SOLN
2400.0000 mg/m2 | INTRAVENOUS | Status: DC
  Administered 2020-10-06: 3950 mg via INTRAVENOUS
  Filled 2020-10-06: qty 79

## 2020-10-06 MED ORDER — SODIUM CHLORIDE 0.9% FLUSH
10.0000 mL | Freq: Once | INTRAVENOUS | Status: AC
  Administered 2020-10-06: 10 mL

## 2020-10-06 MED ORDER — SODIUM CHLORIDE 0.9 % IV SOLN
70.0000 mg/m2 | Freq: Once | INTRAVENOUS | Status: AC
  Administered 2020-10-06: 116.1 mg via INTRAVENOUS
  Filled 2020-10-06: qty 27

## 2020-10-06 MED ORDER — SODIUM CHLORIDE 0.9 % IV SOLN
10.0000 mg | Freq: Once | INTRAVENOUS | Status: AC
  Administered 2020-10-06: 10 mg via INTRAVENOUS
  Filled 2020-10-06: qty 10

## 2020-10-06 MED ORDER — SODIUM CHLORIDE 0.9 % IV SOLN: Freq: Once | INTRAVENOUS | Status: AC

## 2020-10-06 MED ORDER — PALONOSETRON HCL INJECTION 0.25 MG/5ML
0.2500 mg | Freq: Once | INTRAVENOUS | Status: AC
  Administered 2020-10-06: 0.25 mg via INTRAVENOUS
  Filled 2020-10-06: qty 5

## 2020-10-06 MED ORDER — SODIUM CHLORIDE 0.9 % IV SOLN
400.0000 mg/m2 | Freq: Once | INTRAVENOUS | Status: AC
  Administered 2020-10-06: 660 mg via INTRAVENOUS
  Filled 2020-10-06: qty 33

## 2020-10-06 NOTE — Telephone Encounter (Signed)
Scheduled per sch msg. Called and left msg  

## 2020-10-06 NOTE — Patient Instructions (Signed)
[facility]  Discharge Instructions: Thank you for choosing [facility] to provide your oncology and hematology care.   If you have a lab appointment with the [facility], please go directly to the [facility] and check in at the registration area.   Wear comfortable clothing and clothing appropriate for easy access to any Portacath or PICC line.   We strive to give you quality time with your provider. You may need to reschedule your appointment if you arrive late (15 or more minutes).  Arriving late affects you and other patients whose appointments are after yours.  Also, if you miss three or more appointments without notifying the office, you may be dismissed from the clinic at the provider's discretion.      For prescription refill requests, have your pharmacy contact our office and allow 72 hours for refills to be completed.    Today you received the following chemotherapy and/or immunotherapy agents: Irinotecan Liposomal/Leucovorin/5FU      To help prevent nausea and vomiting after your treatment, we encourage you to take your nausea medication as directed.  BELOW ARE SYMPTOMS THAT SHOULD BE REPORTED IMMEDIATELY: *FEVER GREATER THAN 100.4 F (38 C) OR HIGHER *CHILLS OR SWEATING *NAUSEA AND VOMITING THAT IS NOT CONTROLLED WITH YOUR NAUSEA MEDICATION *UNUSUAL SHORTNESS OF BREATH *UNUSUAL BRUISING OR BLEEDING *URINARY PROBLEMS (pain or burning when urinating, or frequent urination) *BOWEL PROBLEMS (unusual diarrhea, constipation, pain near the anus) TENDERNESS IN MOUTH AND THROAT WITH OR WITHOUT PRESENCE OF ULCERS (sore throat, sores in mouth, or a toothache) UNUSUAL RASH, SWELLING OR PAIN  UNUSUAL VAGINAL DISCHARGE OR ITCHING   Items with * indicate a potential emergency and should be followed up as soon as possible or go to the Emergency Department if any problems should occur.  Please show the CHEMOTHERAPY ALERT CARD or IMMUNOTHERAPY  ALERT CARD at check-in to the Emergency Department and triage nurse.  Should you have questions after your visit or need to cancel or reschedule your appointment, please contact [facility]  Dept: [phone]  and follow the prompts.  Office hours are 8:00 a.m. to 4:30 p.m. Monday - Friday. Please note that voicemails left after 4:00 p.m. may not be returned until the following business day.  We are closed weekends and major holidays. You have access to a nurse at all times for urgent questions. Please call the main number to the clinic Dept: [phone] and follow the prompts.   For any non-urgent questions, you may also contact your provider using MyChart. We now offer e-Visits for anyone 37 and older to request care online for non-urgent symptoms. For details visit [email or web address].   Also download the MyChart app! Go to the app store, search "MyChart", open the app, select [facility], and log in with your MyChart username and password.  Due to Covid, a mask is required upon entering the hospital/clinic. If you do not have a mask, one will be given to you upon arrival. For doctor visits, patients may have 1 support person aged 47 or older with them. For treatment visits, patients cannot have anyone with them due to current Covid guidelines and our immunocompromised population.

## 2020-10-07 ENCOUNTER — Telehealth: Payer: Self-pay | Admitting: Hematology

## 2020-10-07 NOTE — Telephone Encounter (Signed)
Scheduled follow-up appointments per 9/21 los. Patient and patient's niece is aware.

## 2020-10-08 ENCOUNTER — Other Ambulatory Visit: Payer: Self-pay

## 2020-10-08 ENCOUNTER — Inpatient Hospital Stay: Payer: Medicare Other

## 2020-10-08 VITALS — BP 123/63 | HR 71 | Temp 99.2°F | Resp 18

## 2020-10-08 DIAGNOSIS — Z7189 Other specified counseling: Secondary | ICD-10-CM

## 2020-10-08 DIAGNOSIS — C252 Malignant neoplasm of tail of pancreas: Secondary | ICD-10-CM

## 2020-10-08 DIAGNOSIS — C787 Secondary malignant neoplasm of liver and intrahepatic bile duct: Secondary | ICD-10-CM | POA: Diagnosis not present

## 2020-10-08 DIAGNOSIS — Z452 Encounter for adjustment and management of vascular access device: Secondary | ICD-10-CM | POA: Diagnosis not present

## 2020-10-08 DIAGNOSIS — Z5111 Encounter for antineoplastic chemotherapy: Secondary | ICD-10-CM | POA: Diagnosis not present

## 2020-10-08 MED ORDER — SODIUM CHLORIDE 0.9% FLUSH
10.0000 mL | INTRAVENOUS | Status: DC | PRN
Start: 2020-10-08 — End: 2020-10-08
  Administered 2020-10-08: 10 mL

## 2020-10-08 MED ORDER — HEPARIN SOD (PORK) LOCK FLUSH 100 UNIT/ML IV SOLN
500.0000 [IU] | Freq: Once | INTRAVENOUS | Status: AC | PRN
  Administered 2020-10-08: 500 [IU]

## 2020-10-11 ENCOUNTER — Telehealth: Payer: Self-pay | Admitting: Internal Medicine

## 2020-10-11 NOTE — Telephone Encounter (Signed)
Changed to phone visit, ok per vaslow, pt aware

## 2020-10-11 NOTE — Telephone Encounter (Signed)
R/s per pt request to do voicemail , pt aware left  msg for suppport,neice

## 2020-10-12 ENCOUNTER — Inpatient Hospital Stay (HOSPITAL_BASED_OUTPATIENT_CLINIC_OR_DEPARTMENT_OTHER): Payer: Medicare Other | Admitting: Internal Medicine

## 2020-10-12 DIAGNOSIS — G620 Drug-induced polyneuropathy: Secondary | ICD-10-CM | POA: Diagnosis not present

## 2020-10-12 DIAGNOSIS — T451X5A Adverse effect of antineoplastic and immunosuppressive drugs, initial encounter: Secondary | ICD-10-CM | POA: Diagnosis not present

## 2020-10-12 MED ORDER — GABAPENTIN 300 MG PO CAPS: 300.0000 mg | ORAL_CAPSULE | Freq: Two times a day (BID) | ORAL | 3 refills | Status: DC

## 2020-10-12 NOTE — Progress Notes (Signed)
I connected with Mariah Henderson on 10/12/20 at 10:30 AM EDT by telephone visit and verified that I am speaking with the correct person using two identifiers.  I discussed the limitations, risks, security and privacy concerns of performing an evaluation and management service by telemedicine and the availability of in-person appointments. I also discussed with the patient that there may be a patient responsible charge related to this service. The patient expressed understanding and agreed to proceed.  Other persons participating in the visit and their role in the encounter:  daughter  Patient's location:  Home  Provider's location:  Office  Chief Complaint:  Chemotherapy-induced neuropathy ([place])  History of Present Ilness: Mariah Henderson describes painful burning and tingling in her fingers and hands since starting the chemotherapy this past year.  Symptoms have progressed somewhat over the past 6 months.  She has difficulty using her hands at times, opening jars.  Denies any issues with her feet.  Some of these symptoms existed in a milder form before treatment.  She was started on Gabapentin three times per day by Dr. Burr Medico, but this has not helped at all.  Continues on FOLFIRI chemotherapy for metastatic pancreatic cancer.  Observations: Language and cognition at baseline  Assessment and Plan: Chemotherapy-induced neuropathy ([place] [place])  Mariah Henderson presents with clinical syndrome consistent with symmetric, length dependent, small and large fiber peripheral neuropathy.  Etiology is exposure to chemotherapy as well as diabetes.  We reviewed pathophysiology of chemotherapy induced neuropathy, available treatments, and goals of care.  We recommended increasing Gabapentin to 300mg  BID given low initial dose.    Follow Up Instructions: We will give her a call in 3-4 weeks to assess response and continue dose titration  I discussed the assessment and treatment plan with the patient.  The patient was  provided an opportunity to ask questions and all were answered.  The patient agreed with the plan and demonstrated understanding of the instructions.    The patient was advised to call back or seek an in-person evaluation if the symptoms worsen or if the condition fails to improve as anticipated.  I provided 5-10 minutes of non-face-to-face time during this enocunter.  Ventura Sellers, MD   I provided 23 minutes of non face-to-face telephone visit time during this encounter, and > 50% was spent counseling as documented under my assessment & plan.

## 2020-10-13 ENCOUNTER — Telehealth: Payer: Self-pay | Admitting: Internal Medicine

## 2020-10-13 NOTE — Telephone Encounter (Signed)
Scheduled appt per 9/27 los. Called pt, no answer. Left msg with appt date and time.

## 2020-10-19 MED FILL — Dexamethasone Sodium Phosphate Inj 100 MG/10ML: INTRAMUSCULAR | Qty: 1 | Status: AC

## 2020-10-19 NOTE — Progress Notes (Signed)
[name]   Telephone:([phone]) [phone] Fax:(336) [phone]   Clinic Follow up Note   Patient Care Team: Mariah Falcon, MD as PCP - General (Internal Medicine) Mariah Henderson, [place] (Optometry) Mariah Finner, RN (Inactive) as Oncology Nurse Navigator Mariah Merle, MD as Consulting Physician (Oncology) Mariah Kaufman, RN (Inactive) as Registered Nurse 10/20/2020  CHIEF COMPLAINT: Follow up pancreas Henderson   SUMMARY OF ONCOLOGIC HISTORY: Oncology History Overview Note  Henderson Staging Pancreatic Henderson [facility]) Staging form: Exocrine Pancreas, AJCC 8th Edition - Clinical: Stage IV (cT1c, cN0, pM1) - Signed by Mariah Merle, MD on 02/26/2020 Total positive nodes: 0    Pancreatic Henderson ([place])  01/13/2020 Imaging   US Abdomen 01/13/20  IMPRESSION: 1. Suggestion of 3 vascular hypoechoic mass within the liver measuring up to 2.5 cm. 2. Suggestion of a hypoechoic mass within the tail of the pancreas that is not well visualized. 3. Findings concerning for malignancy, please see separately dictated CT abdomen pelvis 01/13/2020.   01/13/2020 Imaging   CT AP 01/13/20  IMPRESSION: 1. Hypoenhancing 2.0 cm in long axis mass in the pancreatic tail compatible with pancreatic adenocarcinoma. The mass abuts the posterior gastric wall but without definite gastric invasion. The splenic vein appears narrowed/attenuated compared to prior exams and the lesion abuts the margin of the splenic artery. 2. Hypoenhancing hepatic masses are new compared to 11/22/2017 and are highly suspicious for metastatic lesions. 3. Questionable 0.8 by 0.8 cm omental tumor nodule versus dense diverticulum above the transverse colon. 4. Other imaging findings of potential clinical significance: Mild distal esophageal wall thickening, query esophagitis. Stable left adrenal adenoma. Sigmoid colon diverticulosis. Lumbar and thoracic spondylosis and degenerative disc disease contributing to  multilevel impingement. 5. Aortic atherosclerosis. Aortic Atherosclerosis (ICD10-I70.0).   01/21/2020 Initial Diagnosis   Pancreatic Henderson ([place])   01/28/2020 Initial Biopsy   FINAL MICROSCOPIC DIAGNOSIS:   A. LIVER, NEEDLE CORE BIOPSY:  - Adenocarcinoma.   COMMENT:   Immunohistochemistry for CK7 is positive.  CDX-2 demonstrates weak to  moderate positive staining.  TTF-1 and PAX 8 demonstrate weak, likely  nonspecific staining.  CK20, GATA-3 and ER are negative.  The  morphologic and immunophenotypic characteristics are compatible with the  clinical impression of a primary pancreatic Henderson.  Radiologic  correlation is encouraged.  If applicable, there is likely sufficient  tissue for ancillary studies (Block A2).  Dr. Vic Henderson reviewed the  case.    02/26/2020 - 05/19/2020 Chemotherapy   First-line Gemcitabine and Abraxane every 2 weeks starting 02/26/20. D/c after 05/19/20 due to disease progression in liver and neuropathy     02/26/2020 Henderson Staging   Staging form: Exocrine Pancreas, AJCC 8th Edition - Clinical: Stage IV (cT1c, cN0, pM1) - Signed by Mariah Merle, MD on 02/26/2020 Total positive nodes: 0   03/08/2020 Genetic Testing   Negative hereditary Henderson genetic testing: no pathogenic variants detected in [facility] Common Hereditary Cancers Panel with preliminary evidence pancreatic Henderson genes.  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 The report date is March 08, 2020.    The Common Hereditary Cancers Panel with preliminary evidence pancreatic Henderson genes offered by [facility] includes sequencing and/or deletion duplication testing of the following 49 genes: APC, ATM, AXIN2, BARD1, BMPR1A, BRCA1, BRCA2, BRIP1, CDH1, CDK4, CDKN2A (p14ARF), CDKN2A (p16INK4a), CHEK2, CTNNA1, DICER1, EPCAM (Deletion/duplication testing only), GREM1 (promoter region deletion/duplication testing only), FANCC, GREM1, HOXB13, KIT, MEN1, MLH1, MSH2, MSH3, MSH6, MUTYH,  NBN, NF1, NHTL1, PALB2, PALLD, PDGFRA, PMS2, POLD1, POLE,  PTEN, RAD50, RAD51C, RAD51D, SDHA, SDHB, SDHC, SDHD, SMAD4, SMARCA4. STK11, TP53, TSC1, TSC2, and VHL.  The following genes were evaluated for sequence changes only: SDHA and HOXB13 c.251G>A variant only.es only: SDHA and HOXB13 c.251G>A variant only.   05/03/2020 Imaging   CT AP  IMPRESSION: 1. Interval decrease in size of a hypodense mass of the central pancreatic tail, consistent with treatment response of primary pancreatic malignancy. 2. Numerous low-attenuation liver lesions are seen, increased in size and number compared to prior examination. Findings are consistent with worsened hepatic metastatic disease. 3. A previously queried omental nodule adjacent to the transverse colon is more clearly a prominent diverticulum on current examination.   Aortic Atherosclerosis (ICD10-I70.0).   06/02/2020 -  Chemotherapy   Second-line FOLFIRI q2weeks starting 06/02/20      CURRENT THERAPY: FOLFIRI q2 weeks  INTERVAL HISTORY: Mariah Henderson returns for follow up and treatment as scheduled. She was last seen and completed another cycle on 10/06/20. She was evaluated for CIPN by Mariah Henderson who recommended to increase gabapentin to 300 mg BID which is helping.  She has no other specific complaints.  Energy and appetite are adequate, denies new or worsening pain.  Bowels move after eating.  Denies nausea/vomiting.  All other systems were reviewed with the patient and are negative.  MEDICAL HISTORY:  Past Medical History:  Diagnosis Date   Acute medial meniscus tear    right knee   Anxiety    Asthma    Henderson ([place])    Cervical spondylosis    Chronic serous otitis media    Constipation    COPD (chronic obstructive pulmonary disease) (HCC)    Depression    followed by Dr. Baird Henderson; no longer of depakote, seroquel   Diabetes mellitus without complication ([place])    Dysuria 12/27/2009   Qualifier: Diagnosis of  By: Mariah Homes MD, Mariah Henderson      Foot drop    GERD (gastroesophageal reflux disease)    Heart murmur    Hyperlipidemia    Hypertension    Low back pain    Obesity    Paraumbilical hernia    Pre-diabetes     SURGICAL HISTORY: Past Surgical History:  Procedure Laterality Date   ABDOMINAL HYSTERECTOMY     ANTERIOR CERVICAL DECOMP/DISCECTOMY FUSION N/A 10/05/2014   Procedure: ACDF - C5-C6 - C6-C7;  Surgeon: Karie Chimera, MD;  Location: [facility];  Service: Neurosurgery;  Laterality: N/A;  ACDF - C5-C6 - C6-C7   CHOLECYSTECTOMY     COLONOSCOPY     FOOT SURGERY     HERNIA REPAIR  1999   IR IMAGING GUIDED PORT INSERTION  02/25/2020   IR US GUIDANCE  01/28/2020   KNEE ARTHROSCOPY WITH MEDIAL MENISECTOMY Right 06/14/2016   Procedure: RIGHT KNEE ARTHROSCOPY WITH PARTIAL MEDIAL MENISCECTOMY, SUBCHONDROPLASTY;  Surgeon: Leandrew Koyanagi, MD;  Location: [place] [facility];  Service: Orthopedics;  Laterality: Right;   KNEE ARTHROSCOPY WITH SUBCHONDROPLASTY Right 06/14/2016   Procedure: KNEE ARTHROSCOPY WITH SUBCHONDROPLASTY;  Surgeon: Leandrew Koyanagi, MD;  Location: [facility];  Service: Orthopedics;  Laterality: Right;   RADIOACTIVE SEED GUIDED EXCISIONAL BREAST BIOPSY Left 06/23/2014   Procedure: RADIOACTIVE SEED GUIDED EXCISIONAL BREAST BIOPSY;  Surgeon: Stark Klein, MD;  Location: [place];  Service: General;  Laterality: Left;    I have reviewed the social history and family history with the patient and they are unchanged from previous note.  ALLERGIES:  has No Known Allergies.  MEDICATIONS:  Current Outpatient Medications  Medication Sig Dispense Refill   ONETOUCH ULTRA test strip TEST BLOOD SUGAR ONCE DAILY 100 strip 3   albuterol (PROVENTIL) (2.5 MG/3ML) 0.083% nebulizer solution Take 3 mLs (2.5 mg total) by nebulization every 6 (six) hours as needed for wheezing. 1080 mL 3   albuterol (VENTOLIN HFA) 108 (90 Base) MCG/ACT inhaler INHALE 1 TO 2 PUFFS INTO THE LUNGS EVERY 6 HOURS AS  NEEDED FOR WHEEZING OR SHORTNESS OF BREATH 54 g 1   alendronate (FOSAMAX) 70 MG tablet TAKE 1 TABLET BY MOUTH EVERY 7 DAYS. TAKE WITH A FULL GLASS OF WATER ON AN EMPTY STOMACH. 12 tablet 3   atorvastatin (LIPITOR) 40 MG tablet TAKE 1 TABLET BY MOUTH EVERY DAY 90 tablet 3   benazepril (LOTENSIN) 20 MG tablet TAKE 1 TABLET(20 MG) BY MOUTH DAILY 90 tablet 3   calcium carbonate (OS-CAL) 600 MG TABS tablet Take by mouth.     celecoxib (CELEBREX) 100 MG capsule TAKE 1 CAPSULE BY MOUTH  TWICE DAILY 180 capsule 3   cholecalciferol (VITAMIN D) 1000 units tablet Take 2,000 Units by mouth daily.     diclofenac Sodium (VOLTAREN) 1 % GEL Apply 2 g topically 4 (four) times daily. 100 g 2   famotidine (PEPCID) 40 MG tablet TAKE 1/2 TABLET(20 MG) BY MOUTH TWICE DAILY 30 tablet 3   fluticasone (FLONASE) 50 MCG/ACT nasal spray Place 1 spray into both nostrils daily. 16 g 3   Fluticasone-Salmeterol (ADVAIR DISKUS) 100-50 MCG/DOSE AEPB Inhale 1 puff into the lungs 2 (two) times daily. 60 each 11   furosemide (LASIX) 20 MG tablet TAKE 1 TABLET BY MOUTH DAILY ON DAY OF CHEMO AND WHILE 5FU INFUSING. THEN TAKE 1/2 TABLET BY MOUTH DAILY 30 tablet 1   gabapentin (NEURONTIN) 100 MG capsule TAKE 1 CAPSULE(100 MG) BY MOUTH THREE TIMES DAILY 90 capsule 1   gabapentin (NEURONTIN) 300 MG capsule Take 1 capsule (300 mg total) by mouth 2 (two) times daily. 60 capsule 3   glipiZIDE (GLUCOTROL XL) 2.5 MG 24 hr tablet TAKE 1 TABLET(2.5 MG) BY MOUTH DAILY WITH BREAKFAST 30 tablet 2   hydrochlorothiazide (MICROZIDE) 12.5 MG capsule TAKE 1 CAPSULE(12.5 MG) BY MOUTH DAILY 90 capsule 1   HYDROcodone-acetaminophen (NORCO/VICODIN) 5-325 MG tablet Take 1 tablet by mouth 2 (two) times daily as needed for severe pain. 60 tablet 0   ipratropium (ATROVENT) 0.02 % nebulizer solution Take 0.5 mg by nebulization every 6 (six) hours as needed for wheezing or shortness of breath.     Lancets (ONETOUCH ULTRASOFT) lancets Use as instructed 100 each 12    latanoprost (XALATAN) 0.005 % ophthalmic solution PLACE 1 DROP INTO BOTH EYES AT BEDTIME  0   loperamide (LOPERAMIDE A-D) 2 MG tablet Take 1 tablet (2 mg total) by mouth 2 (two) times daily as needed for diarrhea or loose stools. 6 tablet 0   loratadine (CLARITIN) 10 MG tablet Take 1 tablet (10 mg total) by mouth daily. 30 tablet 11   magic mouthwash w/lidocaine SOLN Take 5 mLs by mouth 4 (four) times daily as needed for mouth pain. 480 mL 2   meclizine (ANTIVERT) 12.5 MG tablet TAKE 1 TABLET(12.5 MG) BY MOUTH TWICE DAILY AS NEEDED FOR DIZZINESS 15 tablet 3   potassium chloride SA (KLOR-CON) 20 MEQ tablet TAKE 1 TABLET BY MOUTH THREE TIMES DAILY 270 tablet 0   prochlorperazine (COMPAZINE) 10 MG tablet TAKE 1 TABLET(10 MG) BY MOUTH EVERY 6 HOURS AS NEEDED FOR NAUSEA OR VOMITING 30  tablet 3   risperiDONE (RISPERDAL) 0.25 MG tablet Take 0.25 mg by mouth daily as needed.     rivaroxaban (XARELTO) 20 MG TABS tablet Take 1 tablet (20 mg total) by mouth daily with supper. 30 tablet 3   sertraline (ZOLOFT) 50 MG tablet Take 50 mg by mouth daily.     traZODone (DESYREL) 50 MG tablet Take 25-50 mg by mouth at bedtime as needed for sleep.      triamcinolone cream (KENALOG) 0.1 % apply to affected area twice a day 454 g 1   No current facility-administered medications for this visit.   Facility-Administered Medications Ordered in Other Visits  Medication Dose Route Frequency Provider Last Rate Last Admin   heparin lock flush 100 unit/mL  500 Units Intracatheter Once PRN Mariah Merle, MD       sodium chloride flush (NS) 0.9 % injection 10 mL  10 mL Intracatheter PRN Mariah Merle, MD        PHYSICAL EXAMINATION: ECOG PERFORMANCE STATUS: 0 - Asymptomatic  Vitals:   10/20/20 1001  BP: (!) 144/72  Pulse: 73  Resp: 17  Temp: 98.2 F (36.8 C)  SpO2: 100%   Filed Weights   10/20/20 1001  Weight: 141 lb (64 kg)    GENERAL:alert, no distress and comfortable SKIN: no rash  EYES: sclera clear LUNGS:  normal breathing effort NEURO: alert & oriented x 3 with fluent speech, no focal motor deficits PAC without erythema  Limited exam due to covid-19 and no complaints   LABORATORY DATA:  I have reviewed the data as listed CBC Latest Ref Rng & Units 10/20/2020 10/06/2020 09/22/2020  WBC 4.0 - 10.5 K/uL 3.7(L) 4.4 4.2  Hemoglobin 12.0 - 15.0 g/dL 10.6(L) 10.7(L) 10.9(L)  Hematocrit 36.0 - 46.0 % 33.7(L) 32.7(L) 33.3(L)  Platelets 150 - 400 K/uL 141(L) 143(L) 144(L)     CMP Latest Ref Rng & Units 10/20/2020 10/06/2020 09/22/2020  Glucose 70 - 99 mg/dL 255(H) 253(H) 152(H)  BUN 8 - 23 mg/dL _0 Creatinine 0.44 - 1.00 mg/dL 0.83 0.85 0.82  Sodium 135 - 145 mmol/L 141 139 140  Potassium 3.5 - 5.1 mmol/L 3.7 3.9 3.8  Chloride 98 - 111 mmol/L 104 104 103  CO2 22 - 32 mmol/L _1 Calcium 8.9 - 10.3 mg/dL 9.2 9.0 9.5  Total Protein 6.5 - 8.1 g/dL 6.4(L) 6.3(L) 6.3(L)  Total Bilirubin 0.3 - 1.2 mg/dL 0.5 0.6 0.7  Alkaline Phos 38 - 126 U/L 138(H) 137(H) 125  AST 15 - 41 U/L 33 37 35  ALT 0 - 44 U/L _2 RADIOGRAPHIC STUDIES: I have personally reviewed the radiological images as listed and agreed with the findings in the report. No results found.   ASSESSMENT & PLAN: Mariah Henderson is a 70 y.o. female with   1. Pancreatic Henderson with Liver metastasis, stage IV  -Her CT AP from 01/13/20 showed 2cm mass on tail of pancreas abutting but not invading the gastric wall, with likely liver and omental metastasis.  -Her liver biopsy from 01/28/20 showed adenocarcinoma from pancreatic primary. Her 02/11/20 CT Chest showed no metastasis  -She began first-line palliative chemo with gemcitabine and Abraxane every 2 weeks beginning 02/26/20, she progressed in the liver  - changed to second line liposomal irinotecan/leuc and 5FU beginning 06/02/20. Dose reduced for cycle 1 -CT CAP 08/24/2020 showed improved liver metastasis, no new or progressive disease.  Continue current chemo.  Plan to  repeat scan in early November -Tolerating treatment well with neuropathy, no other significant side effects   2. Abdominal Pain, low appetite, 10 lbs weight loss, Transmanitis -She had abdominal pain for the month prior to Henderson diagnosis. She is on Norco 5-374m BID as needed. her pain has improved since chemo  -She had sudden loss of appetite and weight loss since October 2021. -On Chemo her appetite has increased, gaining weight and eating more. -denies pain   3. Comorbidities: Asthma, Borderline Diabetic, GERD, Heart murmur, HLD, HTN, arthritis, neuropathy  -On medications, including multiple diuretics. -Managed by PCP.   -compression stockings for leg edema  -she had pre-existing neuropathy prior to treatment, worsened on chemo -referred to Dr. VMickeal Henderson improved on higher dose gabapentin      4. Social support -She lives alone in 1 level house and she is retired. She has 1 adult son who lives in [place] She has a twin and 3 other sisters who live in [place] and other siblings and relative in [place]  -Her son will help her at home  -She has approved for grant to help with medication. Her sister will notify [place] the type of grant.    5. Cytopenias -secondary to chemotherapy   -stable   6. Goal of care discussion -The patient understands the goal of care is palliative. -currently full code    7. Hypokalemia secondary to diuretic -She is on Lasix and dieretics for her LE edema and heart.  -continue oral K TID -she has worsening leg edema. Doppler 05/19/20 negative for DVT -new Rx 06/16/20: lasix 20 mg po once daily on days 1-3 of chemo, then 10 mg daily    8. Genetic testing from 02/19/20 was negative for pathogenetic mutation with VUS of gene NF1  Disposition:  Ms. SSarinappears stable. She continues liposomal irinotecan and 5FU. She tolerates treatment very well, no significant side effects except worsening of baseline neuropathy, which is improving on increased dose of  gabapentin under the care of Dr. VMickeal Henderson She is otherwise able to recover and function well. There is no clinical evidence of disease progression.   Labs reviewed, adequate to proceed with liposomal irinotecan and 5FU today as planned. We anticipate restaging in November. F/up and next cycle in 2 weeks.   Orders Placed This Encounter  Procedures   CT CHEST ABDOMEN PELVIS W CONTRAST    Standing Status:   Future    Standing Expiration Date:   10/20/2021    Order Specific Question:   [facility]    [facility]   Order Specific Question:   Is Oral Contrast requested for this exam?    Answer:   Yes, Per Radiology protocol    Order Specific Question:   Reason for Exam (SYMPTOM  OR DIAGNOSIS REQUIRED)    Answer:   restaging, metastatic pancreas Henderson, on chemo    All questions were answered. The patient knows to call the clinic with any problems, questions or concerns. No barriers to learning were detected.     LAlla Feeling NP 10/20/20

## 2020-10-20 ENCOUNTER — Inpatient Hospital Stay: Payer: Medicare Other

## 2020-10-20 ENCOUNTER — Encounter: Payer: Self-pay | Admitting: Nurse Practitioner

## 2020-10-20 ENCOUNTER — Inpatient Hospital Stay (HOSPITAL_BASED_OUTPATIENT_CLINIC_OR_DEPARTMENT_OTHER): Payer: Medicare Other | Admitting: Nurse Practitioner

## 2020-10-20 ENCOUNTER — Other Ambulatory Visit: Payer: Self-pay | Admitting: Hematology

## 2020-10-20 ENCOUNTER — Other Ambulatory Visit: Payer: Self-pay

## 2020-10-20 ENCOUNTER — Inpatient Hospital Stay: Payer: Medicare Other | Attending: Nurse Practitioner

## 2020-10-20 VITALS — BP 144/72 | HR 73 | Temp 98.2°F | Resp 17 | Ht 60.0 in | Wt 141.0 lb

## 2020-10-20 DIAGNOSIS — C252 Malignant neoplasm of tail of pancreas: Secondary | ICD-10-CM

## 2020-10-20 DIAGNOSIS — C787 Secondary malignant neoplasm of liver and intrahepatic bile duct: Secondary | ICD-10-CM | POA: Diagnosis not present

## 2020-10-20 DIAGNOSIS — Z95828 Presence of other vascular implants and grafts: Secondary | ICD-10-CM

## 2020-10-20 DIAGNOSIS — Z5111 Encounter for antineoplastic chemotherapy: Secondary | ICD-10-CM | POA: Insufficient documentation

## 2020-10-20 DIAGNOSIS — Z7189 Other specified counseling: Secondary | ICD-10-CM

## 2020-10-20 LAB — CMP (CANCER CENTER ONLY)
ALT: 24 U/L (ref 0–44)
AST: 33 U/L (ref 15–41)
Albumin: 3.2 g/dL — ABNORMAL LOW (ref 3.5–5.0)
Alkaline Phosphatase: 138 U/L — ABNORMAL HIGH (ref 38–126)
Anion gap: 9 (ref 5–15)
BUN: 12 mg/dL (ref 8–23)
CO2: 28 mmol/L (ref 22–32)
Calcium: 9.2 mg/dL (ref 8.9–10.3)
Chloride: 104 mmol/L (ref 98–111)
Creatinine: 0.83 mg/dL (ref 0.44–1.00)
GFR, Estimated: >60 mL/min (ref >60)
Glucose, Bld: 255 mg/dL — ABNORMAL HIGH (ref 70–99)
Potassium: 3.7 mmol/L (ref 3.5–5.1)
Sodium: 141 mmol/L (ref 135–145)
Total Bilirubin: 0.5 mg/dL (ref 0.3–1.2)
Total Protein: 6.4 g/dL — ABNORMAL LOW (ref 6.5–8.1)

## 2020-10-20 LAB — CBC WITH DIFFERENTIAL (CANCER CENTER ONLY)
Abs Immature Granulocytes: 0.01 10*3/uL (ref 0.00–0.07)
Basophils Absolute: 0.1 10*3/uL (ref 0.0–0.1)
Basophils Relative: 1 %
Eosinophils Absolute: 0.3 10*3/uL (ref 0.0–0.5)
Eosinophils Relative: 8 %
HCT: 33.7 % — ABNORMAL LOW (ref 36.0–46.0)
Hemoglobin: 10.6 g/dL — ABNORMAL LOW (ref 12.0–15.0)
Immature Granulocytes: 0 %
Lymphocytes Relative: 9 %
Lymphs Abs: 0.3 10*3/uL — ABNORMAL LOW (ref 0.7–4.0)
MCH: 30 pg (ref 26.0–34.0)
MCHC: 31.5 g/dL (ref 30.0–36.0)
MCV: 95.5 fL (ref 80.0–100.0)
Monocytes Absolute: 0.5 10*3/uL (ref 0.1–1.0)
Monocytes Relative: 14 %
Neutro Abs: 2.5 10*3/uL (ref 1.7–7.7)
Neutrophils Relative %: 68 %
Platelet Count: 141 10*3/uL — ABNORMAL LOW (ref 150–400)
RBC: 3.53 MIL/uL — ABNORMAL LOW (ref 3.87–5.11)
RDW: 17.1 % — ABNORMAL HIGH (ref 11.5–15.5)
WBC Count: 3.7 10*3/uL — ABNORMAL LOW (ref 4.0–10.5)
nRBC: 0 % (ref 0.0–0.2)

## 2020-10-20 MED ORDER — HEPARIN SOD (PORK) LOCK FLUSH 100 UNIT/ML IV SOLN: 500.0000 [IU] | Freq: Once | INTRAVENOUS | Status: DC | PRN

## 2020-10-20 MED ORDER — SODIUM CHLORIDE 0.9% FLUSH: 10.0000 mL | INTRAVENOUS | Status: DC | PRN

## 2020-10-20 MED ORDER — IRINOTECAN HCL LIPOSOME CHEMO INJECTION 43 MG/10ML
70.0000 mg/m2 | INJECTION | Freq: Once | INTRAVENOUS | Status: AC
Start: 2020-10-20 — End: 2020-10-20
  Administered 2020-10-20: 116.1 mg via INTRAVENOUS
  Filled 2020-10-20: qty 27

## 2020-10-20 MED ORDER — PALONOSETRON HCL INJECTION 0.25 MG/5ML
0.2500 mg | Freq: Once | INTRAVENOUS | Status: AC
  Administered 2020-10-20: 0.25 mg via INTRAVENOUS
  Filled 2020-10-20: qty 5

## 2020-10-20 MED ORDER — SODIUM CHLORIDE 0.9 % IV SOLN
2400.0000 mg/m2 | INTRAVENOUS | Status: DC
  Administered 2020-10-20: 3950 mg via INTRAVENOUS
  Filled 2020-10-20: qty 79

## 2020-10-20 MED ORDER — SODIUM CHLORIDE 0.9 % IV SOLN
400.0000 mg/m2 | Freq: Once | INTRAVENOUS | Status: AC
  Administered 2020-10-20: 660 mg via INTRAVENOUS
  Filled 2020-10-20: qty 33

## 2020-10-20 MED ORDER — SODIUM CHLORIDE 0.9% FLUSH
10.0000 mL | Freq: Once | INTRAVENOUS | Status: AC
  Administered 2020-10-20: 10 mL

## 2020-10-20 MED ORDER — SODIUM CHLORIDE 0.9 % IV SOLN
10.0000 mg | Freq: Once | INTRAVENOUS | Status: AC
  Administered 2020-10-20: 10 mg via INTRAVENOUS
  Filled 2020-10-20: qty 10

## 2020-10-20 MED ORDER — SODIUM CHLORIDE 0.9 % IV SOLN: Freq: Once | INTRAVENOUS | Status: AC

## 2020-10-20 NOTE — Patient Instructions (Signed)
[facility]  Discharge Instructions: Thank you for choosing [facility] to provide your oncology and hematology care.   If you have a lab appointment with the [facility], please go directly to the [facility] and check in at the registration area.   Wear comfortable clothing and clothing appropriate for easy access to any Portacath or PICC line.   We strive to give you quality time with your provider. You may need to reschedule your appointment if you arrive late (15 or more minutes).  Arriving late affects you and other patients whose appointments are after yours.  Also, if you miss three or more appointments without notifying the office, you may be dismissed from the clinic at the provider's discretion.      For prescription refill requests, have your pharmacy contact our office and allow 72 hours for refills to be completed.    Today you received the following chemotherapy and/or immunotherapy agents: Irinotecan Liposomal/Leucovorin/5FU      To help prevent nausea and vomiting after your treatment, we encourage you to take your nausea medication as directed.  BELOW ARE SYMPTOMS THAT SHOULD BE REPORTED IMMEDIATELY: *FEVER GREATER THAN 100.4 F (38 C) OR HIGHER *CHILLS OR SWEATING *NAUSEA AND VOMITING THAT IS NOT CONTROLLED WITH YOUR NAUSEA MEDICATION *UNUSUAL SHORTNESS OF BREATH *UNUSUAL BRUISING OR BLEEDING *URINARY PROBLEMS (pain or burning when urinating, or frequent urination) *BOWEL PROBLEMS (unusual diarrhea, constipation, pain near the anus) TENDERNESS IN MOUTH AND THROAT WITH OR WITHOUT PRESENCE OF ULCERS (sore throat, sores in mouth, or a toothache) UNUSUAL RASH, SWELLING OR PAIN  UNUSUAL VAGINAL DISCHARGE OR ITCHING   Items with * indicate a potential emergency and should be followed up as soon as possible or go to the Emergency Department if any problems should occur.  Please show the CHEMOTHERAPY ALERT CARD or IMMUNOTHERAPY  ALERT CARD at check-in to the Emergency Department and triage nurse.  Should you have questions after your visit or need to cancel or reschedule your appointment, please contact [facility]  Dept: [phone]  and follow the prompts.  Office hours are 8:00 a.m. to 4:30 p.m. Monday - Friday. Please note that voicemails left after 4:00 p.m. may not be returned until the following business day.  We are closed weekends and major holidays. You have access to a nurse at all times for urgent questions. Please call the main number to the clinic Dept: [phone] and follow the prompts.   For any non-urgent questions, you may also contact your provider using MyChart. We now offer e-Visits for anyone 38 and older to request care online for non-urgent symptoms. For details visit [email or web address].   Also download the MyChart app! Go to the app store, search "MyChart", open the app, select [facility], and log in with your MyChart username and password.  Due to Covid, a mask is required upon entering the hospital/clinic. If you do not have a mask, one will be given to you upon arrival. For doctor visits, patients may have 1 support person aged 72 or older with them. For treatment visits, patients cannot have anyone with them due to current Covid guidelines and our immunocompromised population.

## 2020-10-21 ENCOUNTER — Telehealth: Payer: Self-pay | Admitting: Hematology

## 2020-10-21 NOTE — Telephone Encounter (Signed)
Left message with follow-up appointments per 10/5 los. 

## 2020-10-22 ENCOUNTER — Inpatient Hospital Stay: Payer: Medicare Other

## 2020-10-22 ENCOUNTER — Other Ambulatory Visit: Payer: Self-pay

## 2020-10-22 ENCOUNTER — Encounter: Payer: Self-pay | Admitting: Hematology

## 2020-10-22 VITALS — BP 130/67 | HR 88 | Temp 99.5°F | Resp 16

## 2020-10-22 DIAGNOSIS — Z95828 Presence of other vascular implants and grafts: Secondary | ICD-10-CM

## 2020-10-22 MED ORDER — HEPARIN SOD (PORK) LOCK FLUSH 100 UNIT/ML IV SOLN: 500.0000 [IU] | Freq: Once | INTRAVENOUS | Status: DC

## 2020-10-22 MED ORDER — SODIUM CHLORIDE 0.9% FLUSH: 10.0000 mL | Freq: Once | INTRAVENOUS | Status: DC

## 2020-10-25 ENCOUNTER — Other Ambulatory Visit: Payer: Self-pay | Admitting: Internal Medicine

## 2020-10-26 NOTE — Telephone Encounter (Signed)
Next appt scheduled 11/05/20 with PCP.

## 2020-11-02 ENCOUNTER — Other Ambulatory Visit: Payer: Self-pay | Admitting: Internal Medicine

## 2020-11-02 ENCOUNTER — Inpatient Hospital Stay (HOSPITAL_BASED_OUTPATIENT_CLINIC_OR_DEPARTMENT_OTHER): Payer: Medicare Other | Admitting: Internal Medicine

## 2020-11-02 DIAGNOSIS — G620 Drug-induced polyneuropathy: Secondary | ICD-10-CM

## 2020-11-02 DIAGNOSIS — T451X5A Adverse effect of antineoplastic and immunosuppressive drugs, initial encounter: Secondary | ICD-10-CM

## 2020-11-02 DIAGNOSIS — C252 Malignant neoplasm of tail of pancreas: Secondary | ICD-10-CM | POA: Diagnosis not present

## 2020-11-02 MED FILL — Dexamethasone Sodium Phosphate Inj 100 MG/10ML: INTRAMUSCULAR | Qty: 1 | Status: AC

## 2020-11-02 NOTE — Progress Notes (Signed)
I connected with Mariah Henderson on 11/02/20 at 10:00 AM EDT by telephone visit and verified that I am speaking with the correct person using two identifiers.  I discussed the limitations, risks, security and privacy concerns of performing an evaluation and management service by telemedicine and the availability of in-person appointments. I also discussed with the patient that there may be a patient responsible charge related to this service. The patient expressed understanding and agreed to proceed.  Other persons participating in the visit and their role in the encounter:  daughter  Patient's location:  Home  Provider's location:  Office  Chief Complaint:  Chemotherapy-induced neuropathy ([place])  History of Present Ilness: Mariah Henderson reports improvement in her neuropathy symptoms since increasing the gabapentin dose.  She still experiences some tingling, but it is improved and pain is better controlled.  No other new complaints, drug has been tolerable overall.  Prior: Describes painful burning and tingling in her fingers and hands since starting the chemotherapy this past year.  Symptoms have progressed somewhat over the past 6 months.  She has difficulty using her hands at times, opening jars.  Denies any issues with her feet.  Some of these symptoms existed in a milder form before treatment.  She was started on Gabapentin three times per day by Dr. Burr Medico, but this has not helped at all.  Continues on FOLFIRI chemotherapy for metastatic pancreatic cancer.  Observations: Language and cognition at baseline  Assessment and Plan: Chemotherapy-induced neuropathy (HCC)  Mariah Henderson is clinically improved today.  We recommended continuing Gabapentin 300mg  BID, she prefers not to increase the dose further at this time.  Follow Up Instructions: Follow up as needed with progressive symptoms.  I discussed the assessment and treatment plan with the patient.  The patient was provided an opportunity  to ask questions and all were answered.  The patient agreed with the plan and demonstrated understanding of the instructions.    The patient was advised to call back or seek an in-person evaluation if the symptoms worsen or if the condition fails to improve as anticipated.  I provided 5-10 minutes of non-face-to-face time during this enocunter.  Ventura Sellers, MD   I provided 15 minutes of non face-to-face telephone visit time during this encounter, and > 50% was spent counseling as documented under my assessment & plan.

## 2020-11-02 NOTE — Progress Notes (Signed)
[facility]   Telephone:([phone]) [phone] [phone]   Clinic Follow up Note   Patient Care Team: Sid Falcon, MD as PCP - General (Internal Medicine) Forrest Moron, [place] (Optometry) Jonnie Finner, RN (Inactive) as Oncology Nurse Navigator Truitt Merle, MD as Consulting Physician (Oncology) Gwyndolyn Kaufman, RN (Inactive) as Registered Nurse  Date of Service:  11/03/2020  CHIEF COMPLAINT: f/u of pancreatic cancer with liver metastasis  ASSESSMENT & PLAN:  Mariah Henderson is a 70 y.o. female with   1. Pancreatic cancer with Liver metastasis, stage IV  -Her CT AP from 01/13/20 showed 2cm mass on tail of pancreas. The mass abuts the posterior gastric wall but without definite gastric invasion. Scan also shows multiple (at least 5) hepatic masses highly suspicious for metastatic lesions and Questionable 0.8 by 0.8 cm omental tumor nodule versus dense diverticulum above the transverse colon. -Her liver biopsy from 01/28/20 showed adenocarcinoma from pancreatic primary. Her 02/11/20 CT Chest showed no metastasis  -I started her on first line chemo with gemcitabine and Abraxane every 2 weeks beginning 02/26/20. D/c after 05/19/20 due to disease progression in liver and chemo related neuropathy. -she is now on second-line FOLFIRI q2weeks starting 06/02/20. she is tolerating very well -restaging CT CAP 08/24/20 showed improved liver metastasis, no new or progressive disease.  Overall stable. We will continue current chemotherapy. Plan to repeat scan in Nov -she is clinically doing well and stable, labs reviewed, overall adequate to proceed with C12 today.   2. Abdominal Pain, low appetite, weight loss, Transmanitis -She had abdominal pain for the past month before cancer diagnosis, 5/10. She is on Norco 5-380m BID as needed. Her pain has much improved since chemo, she is not taking Norco latley  -She has regained most of her lost weight back, overall improved    3.  Comorbidities: Asthma, Borderline Diabetic, GERD, Heart murmur, HLD, HTN, arthritis -On medications, including multiple diuretics. -Managed by PCP.    4. Social support -She lives alone in 1 level house and she is retired. She has 1 adult son who lives in [place] She has a twin and 3 other sisters who live in [place] and other siblings and relative in [place]  -Her son helps her at home  -She has approved for grant to help with medication. Her sister will notify me of the type of grant.    6. Goal of care discussion -The patient understands the goal of care is palliative. -I recommend DNR/DNI, she will think about it    7. Hypokalemia  -She is on Lasix and dieretics for her LE edema and heart.  -continue oral K   8. Genetic testing from 02/19/20 was negative for pathogenetic mutation with VUS of gene NF1   9.  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 She takes it at night as needed. Given the continued neuropathy in her fingers, I recommend she take it regularly. -Continue monitoring    PLAN:  -proceed to C9 FOLFIRI today at same dose  -labs and f/u and chemo in 2 weeks  -restaging CT CAP w cotnrast in 3-4 weeks, I gave her oral contrast today   SUMMARY OF ONCOLOGIC HISTORY: Oncology History Overview Note  Cancer Staging Pancreatic cancer ([facility] Staging form: Exocrine Pancreas, AJCC 8th Edition - Clinical: Stage IV (cT1c, cN0, pM1) - Signed by FTruitt Merle MD on 02/26/2020 Total positive nodes: 0    [place]ancreatic cancer ([place]  01/13/2020 Imaging   US Abdomen 01/13/20  IMPRESSION: 1. Suggestion of 3 vascular hypoechoic mass within the liver measuring up to 2.5 cm. 2. Suggestion of a hypoechoic mass within the tail of the pancreas that is not well visualized. 3. Findings concerning for malignancy, please see separately dictated CT abdomen pelvis 01/13/2020.   01/13/2020 Imaging   CT AP 01/13/20   IMPRESSION: 1. Hypoenhancing 2.0 cm in long axis mass in the pancreatic tail compatible with pancreatic adenocarcinoma. The mass abuts the posterior gastric wall but without definite gastric invasion. The splenic vein appears narrowed/attenuated compared to prior exams and the lesion abuts the margin of the splenic artery. 2. Hypoenhancing hepatic masses are new compared to 11/22/2017 and are highly suspicious for metastatic lesions. 3. Questionable 0.8 by 0.8 cm omental tumor nodule versus dense diverticulum above the transverse colon. 4. Other imaging findings of potential clinical significance: Mild distal esophageal wall thickening, query esophagitis. Stable left adrenal adenoma. Sigmoid colon diverticulosis. Lumbar and thoracic spondylosis and degenerative disc disease contributing to multilevel impingement. 5. Aortic atherosclerosis. Aortic Atherosclerosis (ICD10-I70.0).   01/21/2020 Initial Diagnosis   Pancreatic cancer ([place])   01/28/2020 Initial Biopsy   FINAL MICROSCOPIC DIAGNOSIS:   A. LIVER, NEEDLE CORE BIOPSY:  - Adenocarcinoma.   COMMENT:   Immunohistochemistry for CK7 is positive.  CDX-2 demonstrates weak to  moderate positive staining.  TTF-1 and PAX 8 demonstrate weak, likely  nonspecific staining.  CK20, GATA-3 and ER are negative.  The  morphologic and immunophenotypic characteristics are compatible with the  clinical impression of a primary pancreatic cancer.  Radiologic  correlation is encouraged.  If applicable, there is likely sufficient  tissue for ancillary studies (Block A2).  Dr. Vic Ripper reviewed the  case.    02/26/2020 - 05/19/2020 Chemotherapy   First-line Gemcitabine and Abraxane every 2 weeks starting 02/26/20. D/c after 05/19/20 due to disease progression in liver and neuropathy     02/26/2020 Cancer Staging   Staging form: Exocrine Pancreas, AJCC 8th Edition - Clinical: Stage IV (cT1c, cN0, pM1) - Signed by Truitt Merle, MD on 02/26/2020 Total  positive nodes: 0   03/08/2020 Genetic Testing   Negative hereditary cancer genetic testing: no pathogenic variants detected in [facility] Common Hereditary Cancers Panel with preliminary evidence pancreatic cancer genes.  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 The report date is March 08, 2020.    The Common Hereditary Cancers Panel with preliminary evidence pancreatic cancer genes offered by [employer] includes sequencing and/or deletion duplication testing of the following 49 genes: APC, ATM, AXIN2, BARD1, BMPR1A, BRCA1, BRCA2, BRIP1, CDH1, CDK4, CDKN2A (p14ARF), CDKN2A (p16INK4a), CHEK2, CTNNA1, DICER1, EPCAM (Deletion/duplication testing only), GREM1 (promoter region deletion/duplication testing only), FANCC, GREM1, HOXB13, KIT, MEN1, MLH1, MSH2, MSH3, MSH6, MUTYH, NBN, NF1, NHTL1, PALB2, PALLD, PDGFRA, PMS2, POLD1, POLE, PTEN, RAD50, RAD51C, RAD51D, SDHA, SDHB, SDHC, SDHD, SMAD4, SMARCA4. STK11, TP53, TSC1, TSC2, and VHL.  The following genes were evaluated for sequence changes only: SDHA and HOXB13 c.251G>A variant only.es only: SDHA and HOXB13 c.251G>A variant only.   05/03/2020 Imaging   CT AP  IMPRESSION: 1. Interval decrease in size of a hypodense mass of the central pancreatic tail, consistent with treatment response of primary pancreatic malignancy. 2. Numerous low-attenuation liver lesions are seen, increased in size and number compared to prior examination. Findings are consistent with worsened hepatic metastatic disease. 3. A previously queried omental nodule adjacent to the transverse colon is more clearly a prominent diverticulum on current examination.  Aortic Atherosclerosis (ICD10-I70.0).   06/02/2020 -  Chemotherapy   Second-line FOLFIRI q2weeks starting 06/02/20       CURRENT THERAPY:  Second-line FOLFIRI q2weeks starting 06/02/20.   INTERVAL HISTORY:  Mariah Henderson is here for a follow up of metastatic pancreatic cancer. She  was last seen by me on 09/22/2020, and she presents to clinic alone. I called her niece and spoke with her  She is tolerating chemo well, no complains She has good appetite, weight stable No pain.  She has mild intermittent tingling on fingers and toes, occassionally drops things, no other concerns for function or balance.     All other systems were reviewed with the patient and are negative.  MEDICAL HISTORY:  Past Medical History:  Diagnosis Date   Acute medial meniscus tear    right knee   Anxiety    Asthma    Cancer ([place])    Cervical spondylosis    Chronic serous otitis media    Constipation    COPD (chronic obstructive pulmonary disease) (HCC)    Depression    followed by [name]; no longer of depakote, seroquel   Diabetes mellitus without complication ([place])    Dysuria 12/27/2009   Qualifier: Diagnosis of  By: [name] MD, Amanjot     Foot drop    GERD (gastroesophageal reflux disease)    Heart murmur    Hyperlipidemia    Hypertension    Low back pain    Obesity    Paraumbilical hernia    Pre-diabetes     SURGICAL HISTORY: Past Surgical History:  Procedure Laterality Date   ABDOMINAL HYSTERECTOMY     ANTERIOR CERVICAL DECOMP/DISCECTOMY FUSION N/A 10/05/2014   Procedure: ACDF - C5-C6 - C6-C7;  Surgeon: Karie Chimera, MD;  Location: [facility];  Service: Neurosurgery;  Laterality: N/A;  ACDF - C5-C6 - C6-C7   CHOLECYSTECTOMY     COLONOSCOPY     FOOT SURGERY     HERNIA REPAIR  1999   IR IMAGING GUIDED PORT INSERTION  02/25/2020   IR US GUIDANCE  01/28/2020   KNEE ARTHROSCOPY WITH MEDIAL MENISECTOMY Right 06/14/2016   Procedure: RIGHT KNEE ARTHROSCOPY WITH PARTIAL MEDIAL MENISCECTOMY, SUBCHONDROPLASTY;  Surgeon: Leandrew Koyanagi, MD;  Location: [facility];  Service: Orthopedics;  Laterality: Right;   KNEE ARTHROSCOPY WITH SUBCHONDROPLASTY Right 06/14/2016   Procedure: KNEE ARTHROSCOPY WITH SUBCHONDROPLASTY;  Surgeon: Leandrew Koyanagi, MD;  Location: [facility];  Service: Orthopedics;  Laterality: Right;   RADIOACTIVE SEED GUIDED EXCISIONAL BREAST BIOPSY Left 06/23/2014   Procedure: RADIOACTIVE SEED GUIDED EXCISIONAL BREAST BIOPSY;  Surgeon: Stark Klein, MD;  Location: [facility];  Service: General;  Laterality: Left;    I have reviewed the social history and family history with the patient and they are unchanged from previous note.  ALLERGIES:  has No Known Allergies.  MEDICATIONS:  Current Outpatient Medications  Medication Sig Dispense Refill   glipiZIDE (GLUCOTROL XL) 2.5 MG 24 hr tablet TAKE 1 TABLET(2.5 MG) BY MOUTH DAILY WITH BREAKFAST 30 tablet 2   ONETOUCH ULTRA test strip TEST BLOOD SUGAR ONCE DAILY 100 strip 3   albuterol (PROVENTIL) (2.5 MG/3ML) 0.083% nebulizer solution Take 3 mLs (2.5 mg total) by nebulization every 6 (six) hours as needed for wheezing. 1080 mL 3   albuterol (VENTOLIN HFA) 108 (90 Base) MCG/ACT inhaler INHALE 1 TO 2 PUFFS INTO THE LUNGS EVERY 6 HOURS AS NEEDED FOR WHEEZING OR SHORTNESS OF BREATH 54 g  1   alendronate (FOSAMAX) 70 MG tablet TAKE 1 TABLET BY MOUTH EVERY 7 DAYS. TAKE WITH A FULL GLASS OF WATER ON AN EMPTY STOMACH. 12 tablet 3   atorvastatin (LIPITOR) 40 MG tablet TAKE 1 TABLET BY MOUTH EVERY DAY 90 tablet 3   benazepril (LOTENSIN) 20 MG tablet TAKE 1 TABLET(20 MG) BY MOUTH DAILY 90 tablet 3   calcium carbonate (OS-CAL) 600 MG TABS tablet Take by mouth.     celecoxib (CELEBREX) 100 MG capsule TAKE 1 CAPSULE BY MOUTH  TWICE DAILY 180 capsule 3   cholecalciferol (VITAMIN D) 1000 units tablet Take 2,000 Units by mouth daily.     diclofenac Sodium (VOLTAREN) 1 % GEL Apply 2 g topically 4 (four) times daily. 100 g 2   famotidine (PEPCID) 40 MG tablet TAKE 1/2 TABLET(20 MG) BY MOUTH TWICE DAILY 30 tablet 3   fluticasone (FLONASE) 50 MCG/ACT nasal spray Place 1 spray into both nostrils daily. 16 g 3   Fluticasone-Salmeterol (ADVAIR DISKUS) 100-50 MCG/DOSE AEPB Inhale 1 puff into the  lungs 2 (two) times daily. 60 each 11   furosemide (LASIX) 20 MG tablet TAKE 1 TABLET BY MOUTH DAILY ON DAY OF CHEMO AND WHILE 5FU INFUSING. THEN TAKE 1/2 TABLET BY MOUTH DAILY 30 tablet 1   gabapentin (NEURONTIN) 100 MG capsule TAKE 1 CAPSULE(100 MG) BY MOUTH THREE TIMES DAILY 90 capsule 1   gabapentin (NEURONTIN) 300 MG capsule Take 1 capsule (300 mg total) by mouth 2 (two) times daily. 60 capsule 3   hydrochlorothiazide (MICROZIDE) 12.5 MG capsule TAKE 1 CAPSULE(12.5 MG) BY MOUTH DAILY 90 capsule 1   HYDROcodone-acetaminophen (NORCO/VICODIN) 5-325 MG tablet Take 1 tablet by mouth 2 (two) times daily as needed for severe pain. 60 tablet 0   ipratropium (ATROVENT) 0.02 % nebulizer solution Take 0.5 mg by nebulization every 6 (six) hours as needed for wheezing or shortness of breath.     Lancets (ONETOUCH ULTRASOFT) lancets Use as instructed 100 each 12   latanoprost (XALATAN) 0.005 % ophthalmic solution PLACE 1 DROP INTO BOTH EYES AT BEDTIME  0   loperamide (LOPERAMIDE A-D) 2 MG tablet Take 1 tablet (2 mg total) by mouth 2 (two) times daily as needed for diarrhea or loose stools. 6 tablet 0   loratadine (CLARITIN) 10 MG tablet Take 1 tablet (10 mg total) by mouth daily. 30 tablet 11   magic mouthwash w/lidocaine SOLN Take 5 mLs by mouth 4 (four) times daily as needed for mouth pain. 480 mL 2   meclizine (ANTIVERT) 12.5 MG tablet TAKE 1 TABLET(12.5 MG) BY MOUTH TWICE DAILY AS NEEDED FOR DIZZINESS 15 tablet 3   potassium chloride SA (KLOR-CON) 20 MEQ tablet TAKE 1 TABLET BY MOUTH THREE TIMES DAILY 270 tablet 0   prochlorperazine (COMPAZINE) 10 MG tablet TAKE 1 TABLET(10 MG) BY MOUTH EVERY 6 HOURS AS NEEDED FOR NAUSEA OR VOMITING 30 tablet 3   risperiDONE (RISPERDAL) 0.25 MG tablet Take 0.25 mg by mouth daily as needed.     rivaroxaban (XARELTO) 20 MG TABS tablet Take 1 tablet (20 mg total) by mouth daily with supper. 30 tablet 3   sertraline (ZOLOFT) 50 MG tablet Take 50 mg by mouth daily.      traZODone (DESYREL) 50 MG tablet Take 25-50 mg by mouth at bedtime as needed for sleep.      triamcinolone cream (KENALOG) 0.1 % apply to affected area twice a day 454 g 1   No current facility-administered medications for this visit.  Facility-Administered Medications Ordered in Other Visits  Medication Dose Route Frequency Provider Last Rate Last Admin   dexamethasone (DECADRON) 10 mg in sodium chloride 0.9 % 50 mL IVPB  10 mg Intravenous Once Truitt Merle, MD 204 mL/hr at 11/03/20 1033 10 mg at 11/03/20 1033   fluorouracil (ADRUCIL) 3,950 mg in sodium chloride 0.9 % 71 mL chemo infusion  2,400 mg/m2 (Treatment Plan Recorded) Intravenous 1 day or 1 dose Truitt Merle, MD       heparin lock flush 100 unit/mL  500 Units Intracatheter Once PRN Truitt Merle, MD       irinotecan LIPOSOME (ONIVYDE) 116.1 mg in sodium chloride 0.9 % 500 mL chemo infusion  70 mg/m2 (Treatment Plan Recorded) Intravenous Once Truitt Merle, MD       leucovorin 660 mg in sodium chloride 0.9 % 250 mL infusion  400 mg/m2 (Treatment Plan Recorded) Intravenous Once Truitt Merle, MD       sodium chloride flush (NS) 0.9 % injection 10 mL  10 mL Intracatheter PRN Truitt Merle, MD       sodium chloride flush (NS) 0.9 % injection 10 mL  10 mL Intracatheter PRN Truitt Merle, MD        PHYSICAL EXAMINATION: ECOG PERFORMANCE STATUS: 1 - Symptomatic but completely ambulatory  Vitals:   11/03/20 0926  BP: 135/72  Pulse: 77  Resp: 18  Temp: 98.8 F (37.1 C)  SpO2: 100%    Wt Readings from Last 3 Encounters:  11/03/20 143 lb 12.8 oz (65.2 kg)  10/20/20 141 lb (64 kg)  10/06/20 143 lb 12.8 oz (65.2 kg)     GENERAL:alert, no distress and comfortable SKIN: skin color normal, no rashes or significant lesions EYES: normal, Conjunctiva are pink and non-injected, sclera clear  NEURO: alert & oriented x 3 with fluent speech  LABORATORY DATA:  I have reviewed the data as listed CBC Latest Ref Rng & Units 11/03/2020 10/20/2020 10/06/2020  WBC 4.0 -  10.5 K/uL 3.8(L) 3.7(L) 4.4  Hemoglobin 12.0 - 15.0 g/dL 10.0(L) 10.6(L) 10.7(L)  Hematocrit 36.0 - 46.0 % 30.8(L) 33.7(L) 32.7(L)  Platelets 150 - 400 K/uL 131(L) 141(L) 143(L)     CMP Latest Ref Rng & Units 11/03/2020 10/20/2020 10/06/2020  Glucose 70 - 99 mg/dL 191(H) 255(H) 253(H)  BUN 8 - 23 mg/dL '9 12 14  ' Creatinine 0.44 - 1.00 mg/dL 0.76 0.83 0.85  Sodium 135 - 145 mmol/L 140 141 139  Potassium 3.5 - 5.1 mmol/L 3.9 3.7 3.9  Chloride 98 - 111 mmol/L 105 104 104  CO2 22 - 32 mmol/L '26 28 26  ' Calcium 8.9 - 10.3 mg/dL 9.0 9.2 9.0  Total Protein 6.5 - 8.1 g/dL 6.0(L) 6.4(L) 6.3(L)  Total Bilirubin 0.3 - 1.2 mg/dL 0.5 0.5 0.6  Alkaline Phos 38 - 126 U/L 116 138(H) 137(H)  AST 15 - 41 U/L 32 33 37  ALT 0 - 44 U/L '25 24 24      ' RADIOGRAPHIC STUDIES: I have personally reviewed the radiological images as listed and agreed with the findings in the report. No results found.    No problem-specific Assessment & Plan notes found for this encounter.   Orders Placed This Encounter  Procedures   CT CHEST ABDOMEN PELVIS W CONTRAST    Standing Status:   Future    Standing Expiration Date:   11/03/2021    Order Specific Question:   Preferred imaging location?    Answer:   [facility]    Order  Specific Question:   Release to patient    Answer:   Immediate    Order Specific Question:   Is Oral Contrast requested for this exam?    Answer:   Yes, Per Radiology protocol    All questions were answered. The patient knows to call the clinic with any problems, questions or concerns. No barriers to learning was detected. The total time spent in the appointment was 30 minutes.     Truitt Merle, MD 11/03/2020

## 2020-11-03 ENCOUNTER — Inpatient Hospital Stay: Payer: Medicare Other

## 2020-11-03 ENCOUNTER — Other Ambulatory Visit: Payer: Self-pay

## 2020-11-03 ENCOUNTER — Encounter: Payer: Self-pay | Admitting: Hematology

## 2020-11-03 ENCOUNTER — Inpatient Hospital Stay (HOSPITAL_BASED_OUTPATIENT_CLINIC_OR_DEPARTMENT_OTHER): Payer: Medicare Other | Admitting: Hematology

## 2020-11-03 VITALS — BP 135/72 | HR 77 | Temp 98.8°F | Resp 18 | Ht 60.0 in | Wt 143.8 lb

## 2020-11-03 DIAGNOSIS — C252 Malignant neoplasm of tail of pancreas: Secondary | ICD-10-CM | POA: Diagnosis not present

## 2020-11-03 DIAGNOSIS — C787 Secondary malignant neoplasm of liver and intrahepatic bile duct: Secondary | ICD-10-CM | POA: Diagnosis not present

## 2020-11-03 DIAGNOSIS — Z7189 Other specified counseling: Secondary | ICD-10-CM

## 2020-11-03 DIAGNOSIS — Z95828 Presence of other vascular implants and grafts: Secondary | ICD-10-CM

## 2020-11-03 DIAGNOSIS — Z5111 Encounter for antineoplastic chemotherapy: Secondary | ICD-10-CM | POA: Diagnosis not present

## 2020-11-03 LAB — CBC WITH DIFFERENTIAL (CANCER CENTER ONLY)
Abs Immature Granulocytes: 0.01 10*3/uL (ref 0.00–0.07)
Basophils Absolute: 0 10*3/uL (ref 0.0–0.1)
Basophils Relative: 1 %
Eosinophils Absolute: 0.3 10*3/uL (ref 0.0–0.5)
Eosinophils Relative: 9 %
HCT: 30.8 % — ABNORMAL LOW (ref 36.0–46.0)
Hemoglobin: 10 g/dL — ABNORMAL LOW (ref 12.0–15.0)
Immature Granulocytes: 0 %
Lymphocytes Relative: 10 %
Lymphs Abs: 0.4 10*3/uL — ABNORMAL LOW (ref 0.7–4.0)
MCH: 31.1 pg (ref 26.0–34.0)
MCHC: 32.5 g/dL (ref 30.0–36.0)
MCV: 95.7 fL (ref 80.0–100.0)
Monocytes Absolute: 0.7 10*3/uL (ref 0.1–1.0)
Monocytes Relative: 18 %
Neutro Abs: 2.4 10*3/uL (ref 1.7–7.7)
Neutrophils Relative %: 62 %
Platelet Count: 131 10*3/uL — ABNORMAL LOW (ref 150–400)
RBC: 3.22 MIL/uL — ABNORMAL LOW (ref 3.87–5.11)
RDW: 16.5 % — ABNORMAL HIGH (ref 11.5–15.5)
WBC Count: 3.8 10*3/uL — ABNORMAL LOW (ref 4.0–10.5)
nRBC: 0 % (ref 0.0–0.2)

## 2020-11-03 LAB — CMP (CANCER CENTER ONLY)
ALT: 25 U/L (ref 0–44)
AST: 32 U/L (ref 15–41)
Albumin: 3 g/dL — ABNORMAL LOW (ref 3.5–5.0)
Alkaline Phosphatase: 116 U/L (ref 38–126)
Anion gap: 9 (ref 5–15)
BUN: 9 mg/dL (ref 8–23)
CO2: 26 mmol/L (ref 22–32)
Calcium: 9 mg/dL (ref 8.9–10.3)
Chloride: 105 mmol/L (ref 98–111)
Creatinine: 0.76 mg/dL (ref 0.44–1.00)
GFR, Estimated: >60 mL/min (ref >60)
Glucose, Bld: 191 mg/dL — ABNORMAL HIGH (ref 70–99)
Potassium: 3.9 mmol/L (ref 3.5–5.1)
Sodium: 140 mmol/L (ref 135–145)
Total Bilirubin: 0.5 mg/dL (ref 0.3–1.2)
Total Protein: 6 g/dL — ABNORMAL LOW (ref 6.5–8.1)

## 2020-11-03 MED ORDER — SODIUM CHLORIDE 0.9 % IV SOLN
2400.0000 mg/m2 | INTRAVENOUS | Status: DC
  Administered 2020-11-03: 3950 mg via INTRAVENOUS
  Filled 2020-11-03: qty 79

## 2020-11-03 MED ORDER — PALONOSETRON HCL INJECTION 0.25 MG/5ML
0.2500 mg | Freq: Once | INTRAVENOUS | Status: AC
  Administered 2020-11-03: 0.25 mg via INTRAVENOUS
  Filled 2020-11-03: qty 5

## 2020-11-03 MED ORDER — SODIUM CHLORIDE 0.9 % IV SOLN
10.0000 mg | Freq: Once | INTRAVENOUS | Status: AC
  Administered 2020-11-03: 10 mg via INTRAVENOUS
  Filled 2020-11-03: qty 10

## 2020-11-03 MED ORDER — SODIUM CHLORIDE 0.9 % IV SOLN: Freq: Once | INTRAVENOUS | Status: AC

## 2020-11-03 MED ORDER — SODIUM CHLORIDE 0.9 % IV SOLN
70.0000 mg/m2 | Freq: Once | INTRAVENOUS | Status: AC
  Administered 2020-11-03: 116.1 mg via INTRAVENOUS
  Filled 2020-11-03: qty 27

## 2020-11-03 MED ORDER — SODIUM CHLORIDE 0.9 % IV SOLN
400.0000 mg/m2 | Freq: Once | INTRAVENOUS | Status: AC
  Administered 2020-11-03: 660 mg via INTRAVENOUS
  Filled 2020-11-03: qty 33

## 2020-11-03 MED ORDER — SODIUM CHLORIDE 0.9% FLUSH
10.0000 mL | Freq: Once | INTRAVENOUS | Status: AC
  Administered 2020-11-03: 10 mL

## 2020-11-03 MED ORDER — SODIUM CHLORIDE 0.9% FLUSH
10.0000 mL | INTRAVENOUS | Status: DC | PRN
  Administered 2020-11-03: 10 mL

## 2020-11-03 NOTE — Patient Instructions (Signed)
[facility]  Discharge Instructions: Thank you for choosing [facility] to provide your oncology and hematology care.   If you have a lab appointment with the [facility], please go directly to the [facility] and check in at the registration area.   Wear comfortable clothing and clothing appropriate for easy access to any Portacath or PICC line.   We strive to give you quality time with your provider. You may need to reschedule your appointment if you arrive late (15 or more minutes).  Arriving late affects you and other patients whose appointments are after yours.  Also, if you miss three or more appointments without notifying the office, you may be dismissed from the clinic at the provider's discretion.      For prescription refill requests, have your pharmacy contact our office and allow 72 hours for refills to be completed.    Today you received the following chemotherapy and/or immunotherapy agents irinotecan liposome, leucovorin, Fluorourcil      To help prevent nausea and vomiting after your treatment, we encourage you to take your nausea medication as directed.  BELOW ARE SYMPTOMS THAT SHOULD BE REPORTED IMMEDIATELY: *FEVER GREATER THAN 100.4 F (38 C) OR HIGHER *CHILLS OR SWEATING *NAUSEA AND VOMITING THAT IS NOT CONTROLLED WITH YOUR NAUSEA MEDICATION *UNUSUAL SHORTNESS OF BREATH *UNUSUAL BRUISING OR BLEEDING *URINARY PROBLEMS (pain or burning when urinating, or frequent urination) *BOWEL PROBLEMS (unusual diarrhea, constipation, pain near the anus) TENDERNESS IN MOUTH AND THROAT WITH OR WITHOUT PRESENCE OF ULCERS (sore throat, sores in mouth, or a toothache) UNUSUAL RASH, SWELLING OR PAIN  UNUSUAL VAGINAL DISCHARGE OR ITCHING   Items with * indicate a potential emergency and should be followed up as soon as possible or go to the Emergency Department if any problems should occur.  Please show the CHEMOTHERAPY ALERT CARD or  IMMUNOTHERAPY ALERT CARD at check-in to the Emergency Department and triage nurse.  Should you have questions after your visit or need to cancel or reschedule your appointment, please contact [facility]  Dept: [phone]  and follow the prompts.  Office hours are 8:00 a.m. to 4:30 p.m. Monday - Friday. Please note that voicemails left after 4:00 p.m. may not be returned until the following business day.  We are closed weekends and major holidays. You have access to a nurse at all times for urgent questions. Please call the main number to the clinic Dept: [phone] and follow the prompts.   For any non-urgent questions, you may also contact your provider using MyChart. We now offer e-Visits for anyone 49 and older to request care online for non-urgent symptoms. For details visit [email or web address].   Also download the MyChart app! Go to the app store, search "MyChart", open the app, select [facility], and log in with your MyChart username and password.  Due to Covid, a mask is required upon entering the hospital/clinic. If you do not have a mask, one will be given to you upon arrival. For doctor visits, patients may have 1 support person aged 64 or older with them. For treatment visits, patients cannot have anyone with them due to current Covid guidelines and our immunocompromised population.

## 2020-11-04 ENCOUNTER — Telehealth: Payer: Self-pay | Admitting: Hematology

## 2020-11-04 NOTE — Telephone Encounter (Signed)
Scheduled follow-up appointments per 10/19 los. Patient's niece is aware.

## 2020-11-05 ENCOUNTER — Inpatient Hospital Stay: Payer: Medicare Other

## 2020-11-05 ENCOUNTER — Other Ambulatory Visit: Payer: Self-pay

## 2020-11-05 ENCOUNTER — Encounter: Payer: Medicare Other | Admitting: Internal Medicine

## 2020-11-05 VITALS — BP 117/69 | HR 76 | Temp 98.6°F | Resp 18

## 2020-11-05 DIAGNOSIS — Z95828 Presence of other vascular implants and grafts: Secondary | ICD-10-CM

## 2020-11-05 DIAGNOSIS — Z5111 Encounter for antineoplastic chemotherapy: Secondary | ICD-10-CM | POA: Diagnosis not present

## 2020-11-05 DIAGNOSIS — C252 Malignant neoplasm of tail of pancreas: Secondary | ICD-10-CM | POA: Diagnosis not present

## 2020-11-05 DIAGNOSIS — C787 Secondary malignant neoplasm of liver and intrahepatic bile duct: Secondary | ICD-10-CM | POA: Diagnosis not present

## 2020-11-05 MED ORDER — HEPARIN SOD (PORK) LOCK FLUSH 100 UNIT/ML IV SOLN
500.0000 [IU] | Freq: Once | INTRAVENOUS | Status: AC
  Administered 2020-11-05: 500 [IU]

## 2020-11-05 MED ORDER — SODIUM CHLORIDE 0.9% FLUSH
10.0000 mL | Freq: Once | INTRAVENOUS | Status: AC
  Administered 2020-11-05: 10 mL

## 2020-11-08 ENCOUNTER — Other Ambulatory Visit: Payer: Self-pay

## 2020-11-08 ENCOUNTER — Encounter: Payer: Self-pay | Admitting: Student

## 2020-11-08 ENCOUNTER — Ambulatory Visit (INDEPENDENT_AMBULATORY_CARE_PROVIDER_SITE_OTHER): Payer: Medicare Other | Admitting: Student

## 2020-11-08 VITALS — BP 100/65 | HR 86 | Temp 98.8°F | Ht 60.0 in | Wt 143.0 lb

## 2020-11-08 DIAGNOSIS — I10 Essential (primary) hypertension: Secondary | ICD-10-CM

## 2020-11-08 DIAGNOSIS — E1139 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: Secondary | ICD-10-CM

## 2020-11-08 DIAGNOSIS — T451X5A Adverse effect of antineoplastic and immunosuppressive drugs, initial encounter: Secondary | ICD-10-CM

## 2020-11-08 DIAGNOSIS — R600 Localized edema: Secondary | ICD-10-CM

## 2020-11-08 DIAGNOSIS — C252 Malignant neoplasm of tail of pancreas: Secondary | ICD-10-CM | POA: Diagnosis not present

## 2020-11-08 DIAGNOSIS — G620 Drug-induced polyneuropathy: Secondary | ICD-10-CM | POA: Diagnosis not present

## 2020-11-08 LAB — POCT GLYCOSYLATED HEMOGLOBIN (HGB A1C): Hemoglobin A1C: 6.1 % — AB (ref 4.0–5.6)

## 2020-11-08 LAB — GLUCOSE, CAPILLARY: Glucose-Capillary: 158 mg/dL — ABNORMAL HIGH (ref 70–99)

## 2020-11-08 NOTE — Patient Instructions (Addendum)
Thank you, [name] for allowing [place] to provide your care today. Today we discussed your diabetes, cancer and leg swelling.   The swelling in her legs is likely due to your salt intake.  Recommend continuing salt free diet at home limiting the amount of times you are going to go [employer].  We also recommend buying a compression socks to put on during the day to help improve the swelling.  Your diabetes is very well controlled.   Continue to follow-up with your cancer doctor.  I have ordered the following labs for you:    Lab Orders         Glucose, capillary         POC Hbg A1C      My Chart Access: [email or web address]  Please follow-up in 3 months or as needed.  Please make sure to arrive 15 minutes prior to your next appointment. If you arrive late, you may be asked to reschedule.    We look forward to seeing you next time. Please call our clinic at [phone] if you have any questions or concerns. The best time to call is Monday-Friday from 9am-4pm, but there is someone available 24/7. If after hours or the weekend, call the main hospital number and ask for the Internal Medicine Resident On-Call. If you need medication refills, please notify your pharmacy one week in advance and they will send [place] a request.   Thank you for letting us take part in your care. Wishing you the best!  Lacinda Axon, MD 11/08/2020, 2:21 PM IM Resident, PGY-2 [facility] 41:10

## 2020-11-08 NOTE — Progress Notes (Signed)
   CC: Leg swelling  HPI:  Mariah Henderson is a 70 y.o. female with PMH as below who presents to clinic for evaluation of leg swelling. Please see problem based charting for evaluation, assessment and plan.  Past Medical History:  Diagnosis Date   Acute medial meniscus tear    right knee   Anxiety    Asthma    Cancer ([facility])    Cervical spondylosis    Chronic serous otitis media    Constipation    COPD (chronic obstructive pulmonary disease) ([facility])    Depression    followed by [name]; no longer of depakote, seroquel   Diabetes mellitus without complication ([facility])    Dysuria 12/27/2009   Qualifier: Diagnosis of  By: [name] MD, Amanjot     Foot drop    GERD (gastroesophageal reflux disease)    Heart murmur    Hyperlipidemia    Hypertension    Low back pain    Obesity    Paraumbilical hernia    Pre-diabetes     Review of Systems:  Constitutional: Negative for fever. Positive for fatigue Eyes: Negative for visual changes Respiratory: Negative for shortness of breath Cardiac: Negative for chest pain Extremities: Positive for leg swelling Neuro: Negative for headache or weakness. Positive for numbness and tingling.  Physical Exam: General: Pleasant, elderly female. No acute distress. HEENT: Severe alopecia. MMM.  Neck: Supple. No JVD Cardiac: RRR. No m/r/g. 2+ BLE edema up to mid calf. Respiratory: Lungs CTAB. No wheezing or crackles.  Abdominal: Soft, symmetric and non tender. Normal BS. Skin: Warm, dry and intact without rashes or lesions Extremities: Atraumatic. Full ROM. Palpable radial and DP pulses. Neuro: A&O x 3. Moves all extremities. No focal deficits. Psych: Appropriate mood and affect.  Vitals:   11/08/20 1316  BP: 100/65  Pulse: 86  Temp: 98.8 F (37.1 C)  TempSrc: Oral  SpO2: 100%  Weight: 143 lb (64.9 kg)  Height: 5' (1.524 m)    Assessment & Plan:   See Encounters Tab for problem based charting.  Patient discussed with Dr.   Louann Liv, MD, MPH

## 2020-11-09 ENCOUNTER — Encounter: Payer: Self-pay | Admitting: Student

## 2020-11-09 NOTE — Assessment & Plan Note (Signed)
Well-controlled. A1c has improved from 6.8% to 6.1% today. CBG 158 today. -- Continue glipizide 2.5 mg daily

## 2020-11-09 NOTE — Assessment & Plan Note (Addendum)
She continues to have numbness and tingling in her hands and feet.  Patient reports her gabapentin was not controlling her symptoms so her oncologist recently increased the dose. -- Continue gabapentin 300 mg twice daily, escalate dose as needed for symptomatic improvement. Consider 3 times daily dosing

## 2020-11-09 NOTE — Assessment & Plan Note (Signed)
Patient presents for evaluation of bilateral lower extremity edema.  Patient reports that she started noticing increased leg swelling over the last week. She denies any shortness of breath, PND, lightheadedness, chest pain or orthopnea. Patient reports she often eats a salt free diet but goes to [place] about once a week with her church family where she often eats pizza and other tasty foods. On exam, patient has 2+ bilateral lower extremity pitting edema up to the left calf. Cardiopulmonary exam unremarkable. From chart review, patient has had lower extremity edema on and off dating back to 2014. It was thought to be due to dietary indiscretion and possible venous insufficiency. It improved in the past with Lasix, compression socks and leg elevations. Due to this patient's age, comorbidities and soft BPs, will avoid Lasix for now.  Plan: -- Advised to limit trips to [place] and limits her salt intake --Continue HCTZ 12.5 mg daily -- Advised to wear compression stockings and elevate feet as much as possible -- Follow-up with PCP if symptoms do not improve

## 2020-11-09 NOTE — Assessment & Plan Note (Signed)
BP has been relatively stable.  Slightly soft at 100/65 today.  Patient denies any headaches, dizziness or shortness of breath.  Orthostatic vitals negative.  Patient has been prescribed Lasix by oncologist but reports she has not been taking it.  Recent labs shows normal kidney function. -- Continue HCTZ 12.5 mg daily -- Continue on benazepril 20 mg daily -- Continue to hold Lasix in the setting of soft BP and follow-up with oncologist

## 2020-11-09 NOTE — Assessment & Plan Note (Addendum)
Stable. CT abdomen pelvis on 12/2019 showed 2 cm mass on the tail of the pancreas and multiple hepatic masses. Liver biopsy from 01/2020 showed adenocarcinoma of pancreatic origin. CT chest was negative for lung metastasis.  Patient was started on first-line chemotherapy with gemcitabine andAbraxaneq2wk on 02/2020 but this regimen was discontinued on in 05/2020 due to disease progression in liver andchemo relatedneuropathy. She was then started on second-line FOLFIRI q2wks and has completed 12 cycles. Recent restaging CT chest/abdomen/pelvis on 08/2020 showed improved liver metastasis, no new or progressive disease. --Continue to follow follow-up with oncology --Continue gabapentin for chemotherapy-induced neuropathy --Continue goals of care conversations

## 2020-11-16 ENCOUNTER — Other Ambulatory Visit: Payer: Self-pay | Admitting: Hematology

## 2020-11-16 MED FILL — Dexamethasone Sodium Phosphate Inj 100 MG/10ML: INTRAMUSCULAR | Qty: 1 | Status: AC

## 2020-11-17 ENCOUNTER — Inpatient Hospital Stay: Payer: Medicare Other

## 2020-11-17 ENCOUNTER — Inpatient Hospital Stay: Payer: Medicare Other | Attending: Nurse Practitioner

## 2020-11-17 ENCOUNTER — Inpatient Hospital Stay (HOSPITAL_BASED_OUTPATIENT_CLINIC_OR_DEPARTMENT_OTHER): Payer: Medicare Other | Admitting: Hematology

## 2020-11-17 ENCOUNTER — Other Ambulatory Visit: Payer: Self-pay

## 2020-11-17 VITALS — BP 129/65 | HR 68 | Temp 98.9°F | Resp 18 | Ht 60.0 in | Wt 139.8 lb

## 2020-11-17 DIAGNOSIS — C787 Secondary malignant neoplasm of liver and intrahepatic bile duct: Secondary | ICD-10-CM | POA: Insufficient documentation

## 2020-11-17 DIAGNOSIS — Z452 Encounter for adjustment and management of vascular access device: Secondary | ICD-10-CM | POA: Diagnosis not present

## 2020-11-17 DIAGNOSIS — C252 Malignant neoplasm of tail of pancreas: Secondary | ICD-10-CM | POA: Insufficient documentation

## 2020-11-17 DIAGNOSIS — Z95828 Presence of other vascular implants and grafts: Secondary | ICD-10-CM

## 2020-11-17 DIAGNOSIS — Z7189 Other specified counseling: Secondary | ICD-10-CM

## 2020-11-17 DIAGNOSIS — Z5111 Encounter for antineoplastic chemotherapy: Secondary | ICD-10-CM | POA: Diagnosis not present

## 2020-11-17 LAB — CMP (CANCER CENTER ONLY)
ALT: 26 U/L (ref 0–44)
AST: 36 U/L (ref 15–41)
Albumin: 3.1 g/dL — ABNORMAL LOW (ref 3.5–5.0)
Alkaline Phosphatase: 123 U/L (ref 38–126)
Anion gap: 7 (ref 5–15)
BUN: 11 mg/dL (ref 8–23)
CO2: 28 mmol/L (ref 22–32)
Calcium: 8.7 mg/dL — ABNORMAL LOW (ref 8.9–10.3)
Chloride: 107 mmol/L (ref 98–111)
Creatinine: 0.75 mg/dL (ref 0.44–1.00)
GFR, Estimated: >60 mL/min (ref >60)
Glucose, Bld: 135 mg/dL — ABNORMAL HIGH (ref 70–99)
Potassium: 4 mmol/L (ref 3.5–5.1)
Sodium: 142 mmol/L (ref 135–145)
Total Bilirubin: 0.5 mg/dL (ref 0.3–1.2)
Total Protein: 6.1 g/dL — ABNORMAL LOW (ref 6.5–8.1)

## 2020-11-17 LAB — CBC WITH DIFFERENTIAL (CANCER CENTER ONLY)
Abs Immature Granulocytes: 0.01 10*3/uL (ref 0.00–0.07)
Basophils Absolute: 0 10*3/uL (ref 0.0–0.1)
Basophils Relative: 1 %
Eosinophils Absolute: 0.3 10*3/uL (ref 0.0–0.5)
Eosinophils Relative: 9 %
HCT: 30.8 % — ABNORMAL LOW (ref 36.0–46.0)
Hemoglobin: 9.8 g/dL — ABNORMAL LOW (ref 12.0–15.0)
Immature Granulocytes: 0 %
Lymphocytes Relative: 8 %
Lymphs Abs: 0.3 10*3/uL — ABNORMAL LOW (ref 0.7–4.0)
MCH: 30.7 pg (ref 26.0–34.0)
MCHC: 31.8 g/dL (ref 30.0–36.0)
MCV: 96.6 fL (ref 80.0–100.0)
Monocytes Absolute: 0.6 10*3/uL (ref 0.1–1.0)
Monocytes Relative: 15 %
Neutro Abs: 2.5 10*3/uL (ref 1.7–7.7)
Neutrophils Relative %: 67 %
Platelet Count: 137 10*3/uL — ABNORMAL LOW (ref 150–400)
RBC: 3.19 MIL/uL — ABNORMAL LOW (ref 3.87–5.11)
RDW: 15.9 % — ABNORMAL HIGH (ref 11.5–15.5)
WBC Count: 3.8 10*3/uL — ABNORMAL LOW (ref 4.0–10.5)
nRBC: 0 % (ref 0.0–0.2)

## 2020-11-17 MED ORDER — SODIUM CHLORIDE 0.9% FLUSH
10.0000 mL | Freq: Once | INTRAVENOUS | Status: AC
  Administered 2020-11-17: 10 mL

## 2020-11-17 MED ORDER — SODIUM CHLORIDE 0.9 % IV SOLN: Freq: Once | INTRAVENOUS | Status: AC

## 2020-11-17 MED ORDER — SODIUM CHLORIDE 0.9 % IV SOLN
2400.0000 mg/m2 | INTRAVENOUS | Status: DC
  Administered 2020-11-17: 3950 mg via INTRAVENOUS
  Filled 2020-11-17: qty 79

## 2020-11-17 MED ORDER — SODIUM CHLORIDE 0.9 % IV SOLN
70.0000 mg/m2 | Freq: Once | INTRAVENOUS | Status: AC
  Administered 2020-11-17: 116.1 mg via INTRAVENOUS
  Filled 2020-11-17: qty 27

## 2020-11-17 MED ORDER — SODIUM CHLORIDE 0.9 % IV SOLN
400.0000 mg/m2 | Freq: Once | INTRAVENOUS | Status: AC
  Administered 2020-11-17: 660 mg via INTRAVENOUS
  Filled 2020-11-17: qty 33

## 2020-11-17 MED ORDER — PALONOSETRON HCL INJECTION 0.25 MG/5ML
0.2500 mg | Freq: Once | INTRAVENOUS | Status: AC
  Administered 2020-11-17: 0.25 mg via INTRAVENOUS
  Filled 2020-11-17: qty 5

## 2020-11-17 MED ORDER — SODIUM CHLORIDE 0.9 % IV SOLN
10.0000 mg | Freq: Once | INTRAVENOUS | Status: AC
  Administered 2020-11-17: 10 mg via INTRAVENOUS
  Filled 2020-11-17: qty 10

## 2020-11-17 NOTE — Progress Notes (Addendum)
[facility]   [phone] [phone]   Clinic Follow up Note   Patient Care Team: Sid Falcon, MD as PCP - General (Internal Medicine) Forrest Moron, [place] (Optometry) Jonnie Finner, RN (Inactive) as Oncology Nurse Navigator Truitt Merle, MD as Consulting Physician (Oncology) Gwyndolyn Kaufman, RN (Inactive) as Registered Nurse  Date of Service:  11/17/2020  CHIEF COMPLAINT: f/u of pancreatic cancer with liver metastasis  CURRENT THERAPY:  Second-line FOLFIRI q2weeks starting 06/02/20.  ASSESSMENT & PLAN:  Mariah Henderson is a 70 y.o. female with   1. Pancreatic cancer with Liver metastasis, stage IV  -Her CT AP from 01/13/20 showed 2cm mass on tail of pancreas. The mass abuts the posterior gastric wall but without definite gastric invasion. Scan also shows multiple (at least 5) hepatic masses highly suspicious for metastatic lesions and Questionable 0.8 by 0.8 cm omental tumor nodule versus dense diverticulum above the transverse colon. -Her liver biopsy from 01/28/20 showed adenocarcinoma from pancreatic primary. Her 02/11/20 CT Chest showed no metastasis  -I started her on first line chemo with gemcitabine and Abraxane every 2 weeks beginning 02/26/20. D/c after 05/19/20 due to disease progression in liver and chemo related neuropathy. -she is now on second-line FOLFIRI q2weeks starting 06/02/20. she is tolerating very well -restaging CT CAP 08/24/20 showed improved liver metastasis, no new or progressive disease.  Overall stable. We will continue current chemotherapy. She is scheduled for restaging scan on 11/25/20. -she is clinically doing well and stable, labs reviewed, overall adequate to proceed with C13 today.   2. Abdominal Pain, low appetite, weight loss, Transmanitis -She had abdominal pain for the past month before cancer diagnosis, 5/10. She is on Norco 5-353m BID as needed. Her pain has much improved since chemo, she is not taking Norco  latley  -She has regained most of her lost weight back, overall improved    3. Comorbidities: Asthma, Borderline Diabetic, GERD, Heart murmur, HLD, HTN, arthritis -On medications, including multiple diuretics. -Managed by PCP.    4. Social support -She lives alone in 1 level house and she is retired. She has 1 adult son who lives in [place] She has a twin and 3 other sisters who live in [place] and other siblings and relative in [place]  -Her son helps her at home  -She has approved for grant to help with medication. Her sister will notify me of the type of grant.    6. Goal of care discussion -The patient understands the goal of care is palliative. -I recommend DNR/DNI, she will think about it    7. Hypokalemia  -She is on Lasix and dieretics for her LE edema and heart.  -continue oral K   8. Genetic testing from 02/19/20 was negative for pathogenetic mutation with VUS of gene NF1   9.  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 She takes it at night as needed. Given the continued neuropathy in her fingers, I recommend she take it regularly. -Continue monitoring     PLAN:  -proceed to C13 FOLFIRI today at same dose  -restaging CT 11/25/20 -labs and f/u and chemo in 2 weeks    No problem-specific Assessment & Plan notes found for this encounter.   SUMMARY OF ONCOLOGIC HISTORY: Oncology History Overview Note  Cancer Staging Pancreatic cancer ([facility] Staging form: Exocrine Pancreas, AJCC 8th Edition - Clinical: Stage IV (cT1c, cN0, pM1) - Signed by FTruitt Merle  MD on 02/26/2020 Total positive nodes: 0    Pancreatic cancer ([place])  01/13/2020 Imaging   US Abdomen 01/13/20  IMPRESSION: 1. Suggestion of 3 vascular hypoechoic mass within the liver measuring up to 2.5 cm. 2. Suggestion of a hypoechoic mass within the tail of the pancreas that is not well visualized. 3. Findings concerning for malignancy,  please see separately dictated CT abdomen pelvis 01/13/2020.   01/13/2020 Imaging   CT AP 01/13/20  IMPRESSION: 1. Hypoenhancing 2.0 cm in long axis mass in the pancreatic tail compatible with pancreatic adenocarcinoma. The mass abuts the posterior gastric wall but without definite gastric invasion. The splenic vein appears narrowed/attenuated compared to prior exams and the lesion abuts the margin of the splenic artery. 2. Hypoenhancing hepatic masses are new compared to 11/22/2017 and are highly suspicious for metastatic lesions. 3. Questionable 0.8 by 0.8 cm omental tumor nodule versus dense diverticulum above the transverse colon. 4. Other imaging findings of potential clinical significance: Mild distal esophageal wall thickening, query esophagitis. Stable left adrenal adenoma. Sigmoid colon diverticulosis. Lumbar and thoracic spondylosis and degenerative disc disease contributing to multilevel impingement. 5. Aortic atherosclerosis. Aortic Atherosclerosis (ICD10-I70.0).   01/21/2020 Initial Diagnosis   Pancreatic cancer ([place])   01/28/2020 Initial Biopsy   FINAL MICROSCOPIC DIAGNOSIS:   A. LIVER, NEEDLE CORE BIOPSY:  - Adenocarcinoma.   COMMENT:   Immunohistochemistry for CK7 is positive.  CDX-2 demonstrates weak to  moderate positive staining.  TTF-1 and PAX 8 demonstrate weak, likely  nonspecific staining.  CK20, GATA-3 and ER are negative.  The  morphologic and immunophenotypic characteristics are compatible with the  clinical impression of a primary pancreatic cancer.  Radiologic  correlation is encouraged.  If applicable, there is likely sufficient  tissue for ancillary studies (Block A2).  Dr. Vic Ripper reviewed the  case.    02/26/2020 - 05/19/2020 Chemotherapy   First-line Gemcitabine and Abraxane every 2 weeks starting 02/26/20. D/c after 05/19/20 due to disease progression in liver and neuropathy     02/26/2020 Cancer Staging   Staging form: Exocrine Pancreas, AJCC  8th Edition - Clinical: Stage IV (cT1c, cN0, pM1) - Signed by Truitt Merle, MD on 02/26/2020 Total positive nodes: 0    03/08/2020 Genetic Testing   Negative hereditary cancer genetic testing: no pathogenic variants detected in [facility] Common Hereditary Cancers Panel with preliminary evidence pancreatic cancer genes.  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 The report date is March 08, 2020.    The Common Hereditary Cancers Panel with preliminary evidence pancreatic cancer genes offered by [facility] includes sequencing and/or deletion duplication testing of the following 49 genes: APC, ATM, AXIN2, BARD1, BMPR1A, BRCA1, BRCA2, BRIP1, CDH1, CDK4, CDKN2A (p14ARF), CDKN2A (p16INK4a), CHEK2, CTNNA1, DICER1, EPCAM (Deletion/duplication testing only), GREM1 (promoter region deletion/duplication testing only), FANCC, GREM1, HOXB13, KIT, MEN1, MLH1, MSH2, MSH3, MSH6, MUTYH, NBN, NF1, NHTL1, PALB2, PALLD, PDGFRA, PMS2, POLD1, POLE, PTEN, RAD50, RAD51C, RAD51D, SDHA, SDHB, SDHC, SDHD, SMAD4, SMARCA4. STK11, TP53, TSC1, TSC2, and VHL.  The following genes were evaluated for sequence changes only: SDHA and HOXB13 c.251G>A variant only.es only: SDHA and HOXB13 c.251G>A variant only.   05/03/2020 Imaging   CT AP  IMPRESSION: 1. Interval decrease in size of a hypodense mass of the central pancreatic tail, consistent with treatment response of primary pancreatic malignancy. 2. Numerous low-attenuation liver lesions are seen, increased in size and number compared to prior examination. Findings are consistent with worsened hepatic metastatic disease. 3. A previously queried omental nodule adjacent  to the transverse colon is more clearly a prominent diverticulum on current examination.   Aortic Atherosclerosis (ICD10-I70.0).   06/02/2020 -  Chemotherapy   Second-line FOLFIRI q2weeks starting 06/02/20       INTERVAL HISTORY:  Mariah Henderson is here for a follow up of  metastatic pancreatic cancer. She was last seen by me on 10/20/20. She was seen in the infusion area. She reports she is doing well overall. She reports her neuropathy in her hands is improving on the gabapentin. She notes some occasional neuropathy in her feet but it's more in her hands   All other systems were reviewed with the patient and are negative.  MEDICAL HISTORY:  Past Medical History:  Diagnosis Date   Acute medial meniscus tear    right knee   Anxiety    Asthma    Cancer ([place])    Cervical spondylosis    Chronic serous otitis media    Constipation    COPD (chronic obstructive pulmonary disease) (HCC)    Depression    followed by [name]; no longer of depakote, seroquel   Diabetes mellitus without complication ([facility])    Dysuria 12/27/2009   Qualifier: Diagnosis of  By: [name] MD, Amanjot     Foot drop    GERD (gastroesophageal reflux disease)    Heart murmur    Hyperlipidemia    Hypertension    Low back pain    Obesity    Paraumbilical hernia    Pre-diabetes     SURGICAL HISTORY: Past Surgical History:  Procedure Laterality Date   ABDOMINAL HYSTERECTOMY     ANTERIOR CERVICAL DECOMP/DISCECTOMY FUSION N/A 10/05/2014   Procedure: ACDF - C5-C6 - C6-C7;  Surgeon: Karie Chimera, MD;  Location: [facility];  Service: Neurosurgery;  Laterality: N/A;  ACDF - C5-C6 - C6-C7   CHOLECYSTECTOMY     COLONOSCOPY     FOOT SURGERY     HERNIA REPAIR  1999   IR IMAGING GUIDED PORT INSERTION  02/25/2020   IR US GUIDANCE  01/28/2020   KNEE ARTHROSCOPY WITH MEDIAL MENISECTOMY Right 06/14/2016   Procedure: RIGHT KNEE ARTHROSCOPY WITH PARTIAL MEDIAL MENISCECTOMY, SUBCHONDROPLASTY;  Surgeon: Leandrew Koyanagi, MD;  Location: [facility];  Service: Orthopedics;  Laterality: Right;   KNEE ARTHROSCOPY WITH SUBCHONDROPLASTY Right 06/14/2016   Procedure: KNEE ARTHROSCOPY WITH SUBCHONDROPLASTY;  Surgeon: Leandrew Koyanagi, MD;  Location: [facility];  Service: Orthopedics;   Laterality: Right;   RADIOACTIVE SEED GUIDED EXCISIONAL BREAST BIOPSY Left 06/23/2014   Procedure: RADIOACTIVE SEED GUIDED EXCISIONAL BREAST BIOPSY;  Surgeon: Stark Klein, MD;  Location: [facility];  Service: General;  Laterality: Left;    I have reviewed the social history and family history with the patient and they are unchanged from previous note.  ALLERGIES:  has No Known Allergies.  MEDICATIONS:  Current Outpatient Medications  Medication Sig Dispense Refill   glipiZIDE (GLUCOTROL XL) 2.5 MG 24 hr tablet TAKE 1 TABLET(2.5 MG) BY MOUTH DAILY WITH BREAKFAST 30 tablet 2   ONETOUCH ULTRA test strip TEST BLOOD SUGAR ONCE DAILY 100 strip 3   albuterol (PROVENTIL) (2.5 MG/3ML) 0.083% nebulizer solution Take 3 mLs (2.5 mg total) by nebulization every 6 (six) hours as needed for wheezing. 1080 mL 3   albuterol (VENTOLIN HFA) 108 (90 Base) MCG/ACT inhaler INHALE 1 TO 2 PUFFS INTO THE LUNGS EVERY 6 HOURS AS NEEDED FOR WHEEZING OR SHORTNESS OF BREATH 54 g 1   alendronate (FOSAMAX) 70 MG  tablet TAKE 1 TABLET BY MOUTH EVERY 7 DAYS. TAKE WITH A FULL GLASS OF WATER ON AN EMPTY STOMACH. 12 tablet 3   atorvastatin (LIPITOR) 40 MG tablet TAKE 1 TABLET BY MOUTH EVERY DAY 90 tablet 3   benazepril (LOTENSIN) 20 MG tablet TAKE 1 TABLET(20 MG) BY MOUTH DAILY 90 tablet 3   calcium carbonate (OS-CAL) 600 MG TABS tablet Take by mouth.     celecoxib (CELEBREX) 100 MG capsule TAKE 1 CAPSULE BY MOUTH  TWICE DAILY 180 capsule 3   cholecalciferol (VITAMIN D) 1000 units tablet Take 2,000 Units by mouth daily.     diclofenac Sodium (VOLTAREN) 1 % GEL Apply 2 g topically 4 (four) times daily. 100 g 2   famotidine (PEPCID) 40 MG tablet TAKE 1/2 TABLET(20 MG) BY MOUTH TWICE DAILY 30 tablet 3   fluticasone (FLONASE) 50 MCG/ACT nasal spray Place 1 spray into both nostrils daily. 16 g 3   Fluticasone-Salmeterol (ADVAIR DISKUS) 100-50 MCG/DOSE AEPB Inhale 1 puff into the lungs 2 (two) times daily. 60 each 11    furosemide (LASIX) 20 MG tablet TAKE 1 TABLET BY MOUTH EVERY DAY ON DAY OF CHEMO AND WHILE 5FU INFUSING THEN TAKE 1/2 TABLET DAILY 30 tablet 1   gabapentin (NEURONTIN) 100 MG capsule TAKE 1 CAPSULE(100 MG) BY MOUTH THREE TIMES DAILY 90 capsule 1   gabapentin (NEURONTIN) 300 MG capsule Take 1 capsule (300 mg total) by mouth 2 (two) times daily. 60 capsule 3   hydrochlorothiazide (MICROZIDE) 12.5 MG capsule TAKE 1 CAPSULE(12.5 MG) BY MOUTH DAILY 90 capsule 1   HYDROcodone-acetaminophen (NORCO/VICODIN) 5-325 MG tablet Take 1 tablet by mouth 2 (two) times daily as needed for severe pain. 60 tablet 0   ipratropium (ATROVENT) 0.02 % nebulizer solution Take 0.5 mg by nebulization every 6 (six) hours as needed for wheezing or shortness of breath.     Lancets (ONETOUCH ULTRASOFT) lancets Use as instructed 100 each 12   latanoprost (XALATAN) 0.005 % ophthalmic solution PLACE 1 DROP INTO BOTH EYES AT BEDTIME  0   loperamide (LOPERAMIDE A-D) 2 MG tablet Take 1 tablet (2 mg total) by mouth 2 (two) times daily as needed for diarrhea or loose stools. 6 tablet 0   loratadine (CLARITIN) 10 MG tablet Take 1 tablet (10 mg total) by mouth daily. 30 tablet 11   magic mouthwash w/lidocaine SOLN Take 5 mLs by mouth 4 (four) times daily as needed for mouth pain. 480 mL 2   meclizine (ANTIVERT) 12.5 MG tablet TAKE 1 TABLET(12.5 MG) BY MOUTH TWICE DAILY AS NEEDED FOR DIZZINESS 15 tablet 3   potassium chloride SA (KLOR-CON) 20 MEQ tablet TAKE 1 TABLET BY MOUTH THREE TIMES DAILY 270 tablet 0   prochlorperazine (COMPAZINE) 10 MG tablet TAKE 1 TABLET(10 MG) BY MOUTH EVERY 6 HOURS AS NEEDED FOR NAUSEA OR VOMITING 30 tablet 3   risperiDONE (RISPERDAL) 0.25 MG tablet Take 0.25 mg by mouth daily as needed.     rivaroxaban (XARELTO) 20 MG TABS tablet Take 1 tablet (20 mg total) by mouth daily with supper. 30 tablet 3   sertraline (ZOLOFT) 50 MG tablet Take 50 mg by mouth daily.     traZODone (DESYREL) 50 MG tablet Take 25-50 mg by  mouth at bedtime as needed for sleep.      triamcinolone cream (KENALOG) 0.1 % apply to affected area twice a day 454 g 1   No current facility-administered medications for this visit.   Facility-Administered Medications Ordered in Other Visits  Medication Dose Route Frequency Provider Last Rate Last Admin   heparin lock flush 100 unit/mL  500 Units Intracatheter Once PRN Truitt Merle, MD       sodium chloride flush (NS) 0.9 % injection 10 mL  10 mL Intracatheter PRN Truitt Merle, MD        PHYSICAL EXAMINATION: ECOG PERFORMANCE STATUS: 1 - Symptomatic but completely ambulatory  There were no vitals filed for this visit. Wt Readings from Last 3 Encounters:  11/17/20 139 lb 12 oz (63.4 kg)  11/08/20 143 lb (64.9 kg)  11/03/20 143 lb 12.8 oz (65.2 kg)     GENERAL:alert, no distress and comfortable SKIN: skin color normal, no rashes or significant lesions EYES: normal, Conjunctiva are pink and non-injected, sclera clear  NEURO: alert & oriented x 3 with fluent speech  LABORATORY DATA:  I have reviewed the data as listed CBC Latest Ref Rng & Units 11/17/2020 11/03/2020 10/20/2020  WBC 4.0 - 10.5 K/uL 3.8(L) 3.8(L) 3.7(L)  Hemoglobin 12.0 - 15.0 g/dL 9.8(L) 10.0(L) 10.6(L)  Hematocrit 36.0 - 46.0 % 30.8(L) 30.8(L) 33.7(L)  Platelets 150 - 400 K/uL 137(L) 131(L) 141(L)     CMP Latest Ref Rng & Units 11/03/2020 10/20/2020 10/06/2020  Glucose 70 - 99 mg/dL 191(H) 255(H) 253(H)  BUN 8 - 23 mg/dL _0 Creatinine 0.44 - 1.00 mg/dL 0.76 0.83 0.85  Sodium 135 - 145 mmol/L 140 141 139  Potassium 3.5 - 5.1 mmol/L 3.9 3.7 3.9  Chloride 98 - 111 mmol/L 105 104 104  CO2 22 - 32 mmol/L _1 Calcium 8.9 - 10.3 mg/dL 9.0 9.2 9.0  Total Protein 6.5 - 8.1 g/dL 6.0(L) 6.4(L) 6.3(L)  Total Bilirubin 0.3 - 1.2 mg/dL 0.5 0.5 0.6  Alkaline Phos 38 - 126 U/L 116 138(H) 137(H)  AST 15 - 41 U/L 32 33 37  ALT 0 - 44 U/L _2 RADIOGRAPHIC STUDIES: I have personally reviewed the  radiological images as listed and agreed with the findings in the report. No results found.    No orders of the defined types were placed in this encounter.  All questions were answered. The patient knows to call the clinic with any problems, questions or concerns. No barriers to learning was detected. The total time spent in the appointment was 30 minutes.     Truitt Merle, MD 11/17/2020   I, Wilburn Mylar, am acting as scribe for Truitt Merle, MD.   I have reviewed the above documentation for accuracy and completeness, and I agree with the above.

## 2020-11-17 NOTE — Patient Instructions (Addendum)
[facility]   Discharge Instructions: Thank you for choosing [facility] to provide your oncology and hematology care.   If you have a lab appointment with the [facility], please go directly to the [facility] and check in at the registration area.   Wear comfortable clothing and clothing appropriate for easy access to any Portacath or PICC line.   We strive to give you quality time with your provider. You may need to reschedule your appointment if you arrive late (15 or more minutes).  Arriving late affects you and other patients whose appointments are after yours.  Also, if you miss three or more appointments without notifying the office, you may be dismissed from the clinic at the provider's discretion.      For prescription refill requests, have your pharmacy contact our office and allow 72 hours for refills to be completed.    Today you received the following chemotherapy and/or immunotherapy agents: Irinotecan, Leucovorin, and Fluorouracil (Adrucil)      To help prevent nausea and vomiting after your treatment, we encourage you to take your nausea medication as directed.  BELOW ARE SYMPTOMS THAT SHOULD BE REPORTED IMMEDIATELY: *FEVER GREATER THAN 100.4 F (38 C) OR HIGHER *CHILLS OR SWEATING *NAUSEA AND VOMITING THAT IS NOT CONTROLLED WITH YOUR NAUSEA MEDICATION *UNUSUAL SHORTNESS OF BREATH *UNUSUAL BRUISING OR BLEEDING *URINARY PROBLEMS (pain or burning when urinating, or frequent urination) *BOWEL PROBLEMS (unusual diarrhea, constipation, pain near the anus) TENDERNESS IN MOUTH AND THROAT WITH OR WITHOUT PRESENCE OF ULCERS (sore throat, sores in mouth, or a toothache) UNUSUAL RASH, SWELLING OR PAIN  UNUSUAL VAGINAL DISCHARGE OR ITCHING   Items with * indicate a potential emergency and should be followed up as soon as possible or go to the Emergency Department if any problems should occur.  Please show the CHEMOTHERAPY ALERT CARD  or IMMUNOTHERAPY ALERT CARD at check-in to the Emergency Department and triage nurse.  Should you have questions after your visit or need to cancel or reschedule your appointment, please contact [facility]  Dept: [phone]  and follow the prompts.  Office hours are 8:00 a.m. to 4:30 p.m. Monday - Friday. Please note that voicemails left after 4:00 p.m. may not be returned until the following business day.  We are closed weekends and major holidays. You have access to a nurse at all times for urgent questions. Please call the main number to the clinic Dept: [phone] and follow the prompts.   For any non-urgent questions, you may also contact your provider using MyChart. We now offer e-Visits for anyone 85 and older to request care online for non-urgent symptoms. For details visit [email or web address].   Also download the MyChart app! Go to the app store, search "MyChart", open the app, select [facility], and log in with your MyChart username and password.  Due to Covid, a mask is required upon entering the hospital/clinic. If you do not have a mask, one will be given to you upon arrival. For doctor visits, patients may have 1 support person aged 63 or older with them. For treatment visits, patients cannot have anyone with them due to current Covid guidelines and our immunocompromised population.   The chemotherapy medication bag should finish at 46 hours, 96 hours, or 7 days. For example, if your pump is scheduled for 46 hours and it was put on at 4:00 p.m., it should finish at 2:00 p.m. the day it is scheduled to  come off regardless of your appointment time.     Estimated time to finish at 11/4 @ 1:00 pm.   If the display on your pump reads "Low Volume" and it is beeping, take the batteries out of the pump and come to the cancer center for it to be taken off.   If the pump alarms go off prior to the pump reading "Low Volume" then call [phone] and  someone can assist you.  If the plunger comes out and the chemotherapy medication is leaking out, please use your home chemo spill kit to clean up the spill. Do NOT use paper towels or other household products.  If you have problems or questions regarding your pump, please call either [phone] (24 hours a day) or the cancer center Monday-Friday 8:00 a.m.- 4:30 p.m. at the clinic number and we will assist you. If you are unable to get assistance, then go to the nearest Emergency Department and ask the staff to contact the IV team for assistance.

## 2020-11-19 ENCOUNTER — Inpatient Hospital Stay: Payer: Medicare Other

## 2020-11-19 ENCOUNTER — Other Ambulatory Visit: Payer: Self-pay

## 2020-11-19 ENCOUNTER — Telehealth: Payer: Self-pay | Admitting: Internal Medicine

## 2020-11-19 VITALS — BP 114/73 | HR 74 | Temp 99.2°F | Resp 18

## 2020-11-19 DIAGNOSIS — E1139 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: Secondary | ICD-10-CM

## 2020-11-19 DIAGNOSIS — C787 Secondary malignant neoplasm of liver and intrahepatic bile duct: Secondary | ICD-10-CM | POA: Diagnosis not present

## 2020-11-19 DIAGNOSIS — Z5111 Encounter for antineoplastic chemotherapy: Secondary | ICD-10-CM | POA: Diagnosis not present

## 2020-11-19 DIAGNOSIS — Z452 Encounter for adjustment and management of vascular access device: Secondary | ICD-10-CM | POA: Diagnosis not present

## 2020-11-19 DIAGNOSIS — Z7189 Other specified counseling: Secondary | ICD-10-CM

## 2020-11-19 DIAGNOSIS — C252 Malignant neoplasm of tail of pancreas: Secondary | ICD-10-CM

## 2020-11-19 MED ORDER — HEPARIN SOD (PORK) LOCK FLUSH 100 UNIT/ML IV SOLN
500.0000 [IU] | Freq: Once | INTRAVENOUS | Status: AC | PRN
  Administered 2020-11-19: 500 [IU]

## 2020-11-19 MED ORDER — SODIUM CHLORIDE 0.9% FLUSH
10.0000 mL | INTRAVENOUS | Status: DC | PRN
  Administered 2020-11-19: 10 mL

## 2020-11-19 NOTE — Telephone Encounter (Signed)
Pt requesting a call back.  Pt states when taking her BP medication before her Chemo she has been urinating on herself and was instructed to contact her PCP.  Patient also has questions about her meter.  Patient states her current meter is broken and would like to go back to using her old meter.  Please call the patient back as she states she is due to go to her chemo appointment today @ 1pm.

## 2020-11-19 NOTE — Telephone Encounter (Signed)
RTC, Patient states she has been taking her b/p meds before her chemo, but she ends up voiding on herself at chemo because "I can't hold it".  She is asking if it would be ok if she started taking her b/p meds after chemo.  RN told patient that was fine, but to make sure b/p meds are taken at the same time daily and she verbalized understanding.  She also states her meter is broken, she did not like the new meter and wants another meter like her old meter.  Informed patient sometimes insurance companies will not pay for certain meters, but RN will send request to Butch Penny so she can look into this. She is very Patent attorney. SChaplin, RN,BSN

## 2020-11-21 ENCOUNTER — Encounter: Payer: Self-pay | Admitting: Hematology

## 2020-11-22 NOTE — Telephone Encounter (Signed)
TC to patient, asked her which meter she has now and she states "the [employer], that it doesn't work and she doesn't like it anyway".  Per med review, the OneTouch Ultra mini was prescribed in 2019 and 2021.  She is requesting this meter, Butch Penny is out until Wednesday.  Will forward to PCP and ask if she can order OneTouch Ultra Mini device Kit and test strips per pt request. Thank you, SChaplin, RN,BSN

## 2020-11-22 NOTE — Telephone Encounter (Signed)
Pt rtn call about her Meter.  Butch Penny is out until Wednesday and the pt states she needs a meter before Wednesday.  Pt states can someone help her with ordering a new one so she won't have to wait.  Please call the patient back.

## 2020-11-24 NOTE — Telephone Encounter (Signed)
I am unsure if [employer] still makes a One touch ultra mimi meter. I believe they have transitioned to only making ONETOUCH Verio meters. I suggest ordering one of these

## 2020-11-24 NOTE — Telephone Encounter (Signed)
Onetouch verio prescriptions in this note ready to be signed.

## 2020-11-25 ENCOUNTER — Other Ambulatory Visit: Payer: Self-pay

## 2020-11-25 ENCOUNTER — Ambulatory Visit (HOSPITAL_COMMUNITY)
Admission: RE | Admit: 2020-11-25 | Discharge: 2020-11-25 | Disposition: A | Discharge status: Home / Self Care | Payer: Medicare Other | Source: Ambulatory Visit | Attending: Hematology | Admitting: Hematology

## 2020-11-25 DIAGNOSIS — K769 Liver disease, unspecified: Secondary | ICD-10-CM | POA: Diagnosis not present

## 2020-11-25 DIAGNOSIS — D3500 Benign neoplasm of unspecified adrenal gland: Secondary | ICD-10-CM | POA: Diagnosis not present

## 2020-11-25 DIAGNOSIS — C252 Malignant neoplasm of tail of pancreas: Secondary | ICD-10-CM | POA: Insufficient documentation

## 2020-11-25 DIAGNOSIS — J8410 Pulmonary fibrosis, unspecified: Secondary | ICD-10-CM | POA: Diagnosis not present

## 2020-11-25 DIAGNOSIS — C259 Malignant neoplasm of pancreas, unspecified: Secondary | ICD-10-CM | POA: Diagnosis not present

## 2020-11-25 DIAGNOSIS — J432 Centrilobular emphysema: Secondary | ICD-10-CM | POA: Diagnosis not present

## 2020-11-25 DIAGNOSIS — I2510 Atherosclerotic heart disease of native coronary artery without angina pectoris: Secondary | ICD-10-CM | POA: Diagnosis not present

## 2020-11-25 DIAGNOSIS — K831 Obstruction of bile duct: Secondary | ICD-10-CM | POA: Diagnosis not present

## 2020-11-25 DIAGNOSIS — I700 Atherosclerosis of aorta: Secondary | ICD-10-CM | POA: Diagnosis not present

## 2020-11-25 MED ORDER — ONETOUCH VERIO VI STRP: ORAL_STRIP | 3 refills | Status: DC

## 2020-11-25 MED ORDER — ACCU-CHEK SOFTCLIX LANCETS MISC: 3 refills | Status: DC

## 2020-11-25 MED ORDER — ONETOUCH VERIO REFLECT W/DEVICE KIT: PACK | 0 refills | Status: DC

## 2020-11-25 MED ORDER — IOHEXOL 350 MG/ML SOLN
100.0000 mL | Freq: Once | INTRAVENOUS | Status: AC | PRN
  Administered 2020-11-25: 100 mL via INTRAVENOUS

## 2020-11-25 NOTE — Telephone Encounter (Signed)
Patient calling again for a new meter.

## 2020-11-25 NOTE — Telephone Encounter (Signed)
Signed the orders.

## 2020-11-25 NOTE — Telephone Encounter (Signed)
Patient notified that meter and supplies have been sent to [facility] on [address]. Also, made her aware they no longer make her previous meter. Informed her to call back to schedule appt with Butch Penny if she has questions on using new meter. She was very Patent attorney.

## 2020-11-28 ENCOUNTER — Other Ambulatory Visit: Payer: Self-pay | Admitting: Internal Medicine

## 2020-11-28 DIAGNOSIS — I10 Essential (primary) hypertension: Secondary | ICD-10-CM

## 2020-11-29 MED FILL — Dexamethasone Sodium Phosphate Inj 100 MG/10ML: INTRAMUSCULAR | Qty: 1 | Status: AC

## 2020-11-29 NOTE — Telephone Encounter (Signed)
Next appt scheduled 12/24/20 with PCP.

## 2020-11-30 ENCOUNTER — Other Ambulatory Visit: Payer: Medicare Other

## 2020-11-30 ENCOUNTER — Ambulatory Visit: Payer: Medicare Other

## 2020-11-30 ENCOUNTER — Other Ambulatory Visit: Payer: Self-pay

## 2020-11-30 ENCOUNTER — Inpatient Hospital Stay: Payer: Medicare Other

## 2020-11-30 ENCOUNTER — Ambulatory Visit: Payer: Medicare Other | Admitting: Hematology

## 2020-11-30 ENCOUNTER — Inpatient Hospital Stay (HOSPITAL_BASED_OUTPATIENT_CLINIC_OR_DEPARTMENT_OTHER): Payer: Medicare Other | Admitting: Hematology

## 2020-11-30 VITALS — BP 134/64 | HR 72 | Temp 98.8°F | Resp 18 | Ht 60.0 in | Wt 141.7 lb

## 2020-11-30 DIAGNOSIS — C252 Malignant neoplasm of tail of pancreas: Secondary | ICD-10-CM

## 2020-11-30 DIAGNOSIS — Z7189 Other specified counseling: Secondary | ICD-10-CM

## 2020-11-30 DIAGNOSIS — Z452 Encounter for adjustment and management of vascular access device: Secondary | ICD-10-CM | POA: Diagnosis not present

## 2020-11-30 DIAGNOSIS — Z95828 Presence of other vascular implants and grafts: Secondary | ICD-10-CM

## 2020-11-30 DIAGNOSIS — C787 Secondary malignant neoplasm of liver and intrahepatic bile duct: Secondary | ICD-10-CM | POA: Diagnosis not present

## 2020-11-30 DIAGNOSIS — Z5111 Encounter for antineoplastic chemotherapy: Secondary | ICD-10-CM | POA: Diagnosis not present

## 2020-11-30 LAB — CBC WITH DIFFERENTIAL (CANCER CENTER ONLY)
Abs Immature Granulocytes: 0 10*3/uL (ref 0.00–0.07)
Basophils Absolute: 0 10*3/uL (ref 0.0–0.1)
Basophils Relative: 1 %
Eosinophils Absolute: 0.3 10*3/uL (ref 0.0–0.5)
Eosinophils Relative: 7 %
HCT: 27.5 % — ABNORMAL LOW (ref 36.0–46.0)
Hemoglobin: 8.8 g/dL — ABNORMAL LOW (ref 12.0–15.0)
Immature Granulocytes: 0 %
Lymphocytes Relative: 8 %
Lymphs Abs: 0.3 10*3/uL — ABNORMAL LOW (ref 0.7–4.0)
MCH: 30.7 pg (ref 26.0–34.0)
MCHC: 32 g/dL (ref 30.0–36.0)
MCV: 95.8 fL (ref 80.0–100.0)
Monocytes Absolute: 0.5 10*3/uL (ref 0.1–1.0)
Monocytes Relative: 14 %
Neutro Abs: 2.6 10*3/uL (ref 1.7–7.7)
Neutrophils Relative %: 70 %
Platelet Count: 128 10*3/uL — ABNORMAL LOW (ref 150–400)
RBC: 2.87 MIL/uL — ABNORMAL LOW (ref 3.87–5.11)
RDW: 15.9 % — ABNORMAL HIGH (ref 11.5–15.5)
WBC Count: 3.7 10*3/uL — ABNORMAL LOW (ref 4.0–10.5)
nRBC: 0 % (ref 0.0–0.2)

## 2020-11-30 LAB — CMP (CANCER CENTER ONLY)
ALT: 27 U/L (ref 0–44)
AST: 31 U/L (ref 15–41)
Albumin: 2.8 g/dL — ABNORMAL LOW (ref 3.5–5.0)
Alkaline Phosphatase: 108 U/L (ref 38–126)
Anion gap: 7 (ref 5–15)
BUN: 13 mg/dL (ref 8–23)
CO2: 28 mmol/L (ref 22–32)
Calcium: 8.8 mg/dL — ABNORMAL LOW (ref 8.9–10.3)
Chloride: 104 mmol/L (ref 98–111)
Creatinine: 0.76 mg/dL (ref 0.44–1.00)
GFR, Estimated: >60 mL/min (ref >60)
Glucose, Bld: 225 mg/dL — ABNORMAL HIGH (ref 70–99)
Potassium: 3.8 mmol/L (ref 3.5–5.1)
Sodium: 139 mmol/L (ref 135–145)
Total Bilirubin: 0.4 mg/dL (ref 0.3–1.2)
Total Protein: 5.7 g/dL — ABNORMAL LOW (ref 6.5–8.1)

## 2020-11-30 MED ORDER — SODIUM CHLORIDE 0.9 % IV SOLN
2000.0000 mg/m2 | INTRAVENOUS | Status: DC
  Administered 2020-11-30: 3300 mg via INTRAVENOUS
  Filled 2020-11-30: qty 66

## 2020-11-30 MED ORDER — PALONOSETRON HCL INJECTION 0.25 MG/5ML
0.2500 mg | Freq: Once | INTRAVENOUS | Status: AC
  Administered 2020-11-30: 0.25 mg via INTRAVENOUS
  Filled 2020-11-30: qty 5

## 2020-11-30 MED ORDER — SODIUM CHLORIDE 0.9% FLUSH
10.0000 mL | Freq: Once | INTRAVENOUS | Status: AC
  Administered 2020-11-30: 10 mL

## 2020-11-30 MED ORDER — RIVAROXABAN 20 MG PO TABS: 20.0000 mg | ORAL_TABLET | Freq: Every day | ORAL | 3 refills | Status: DC

## 2020-11-30 MED ORDER — SODIUM CHLORIDE 0.9 % IV SOLN
400.0000 mg/m2 | Freq: Once | INTRAVENOUS | Status: AC
  Administered 2020-11-30: 660 mg via INTRAVENOUS
  Filled 2020-11-30: qty 33

## 2020-11-30 MED ORDER — SODIUM CHLORIDE 0.9 % IV SOLN
86.0000 mg | Freq: Once | INTRAVENOUS | Status: AC
  Administered 2020-11-30: 86 mg via INTRAVENOUS
  Filled 2020-11-30: qty 20

## 2020-11-30 MED ORDER — SODIUM CHLORIDE 0.9 % IV SOLN: Freq: Once | INTRAVENOUS | Status: AC

## 2020-11-30 MED ORDER — DEXAMETHASONE SODIUM PHOSPHATE 100 MG/10ML IJ SOLN
10.0000 mg | Freq: Once | INTRAMUSCULAR | Status: AC
  Administered 2020-11-30: 10 mg via INTRAVENOUS
  Filled 2020-11-30: qty 10

## 2020-11-30 NOTE — Patient Instructions (Signed)
[facility]   Discharge Instructions: Thank you for choosing [facility] to provide your oncology and hematology care.   If you have a lab appointment with the Cancer Center, please go directly to the Cancer Center and check in at the registration area.   Wear comfortable clothing and clothing appropriate for easy access to any Portacath or PICC line.   We strive to give you quality time with your provider. You may need to reschedule your appointment if you arrive late (15 or more minutes).  Arriving late affects you and other patients whose appointments are after yours.  Also, if you miss three or more appointments without notifying the office, you may be dismissed from the clinic at the provider's discretion.      For prescription refill requests, have your pharmacy contact our office and allow 72 hours for refills to be completed.    Today you received the following chemotherapy and/or immunotherapy agents: Irinotecan, Leucovorin, and Fluorouracil (Adrucil)      To help prevent nausea and vomiting after your treatment, we encourage you to take your nausea medication as directed.  BELOW ARE SYMPTOMS THAT SHOULD BE REPORTED IMMEDIATELY: *FEVER GREATER THAN 100.4 F (38 C) OR HIGHER *CHILLS OR SWEATING *NAUSEA AND VOMITING THAT IS NOT CONTROLLED WITH YOUR NAUSEA MEDICATION *UNUSUAL SHORTNESS OF BREATH *UNUSUAL BRUISING OR BLEEDING *URINARY PROBLEMS (pain or burning when urinating, or frequent urination) *BOWEL PROBLEMS (unusual diarrhea, constipation, pain near the anus) TENDERNESS IN MOUTH AND THROAT WITH OR WITHOUT PRESENCE OF ULCERS (sore throat, sores in mouth, or a toothache) UNUSUAL RASH, SWELLING OR PAIN  UNUSUAL VAGINAL DISCHARGE OR ITCHING   Items with * indicate a potential emergency and should be followed up as soon as possible or go to the Emergency Department if any problems should occur.  Please show the CHEMOTHERAPY ALERT CARD  or IMMUNOTHERAPY ALERT CARD at check-in to the Emergency Department and triage nurse.  Should you have questions after your visit or need to cancel or reschedule your appointment, please contact [facility]  Dept: [phone]  and follow the prompts.  Office hours are 8:00 a.m. to 4:30 p.m. Monday - Friday. Please note that voicemails left after 4:00 p.m. may not be returned until the following business day.  We are closed weekends and major holidays. You have access to a nurse at all times for urgent questions. Please call the main number to the clinic Dept: [phone] and follow the prompts.   For any non-urgent questions, you may also contact your provider using MyChart. We now offer e-Visits for anyone 18 and older to request care online for non-urgent symptoms. For details visit [email or web address].   Also download the MyChart app! Go to the app store, search "MyChart", open the app, select [facility], and log in with your MyChart username and password.  Due to Covid, a mask is required upon entering the hospital/clinic. If you do not have a mask, one will be given to you upon arrival. For doctor visits, patients may have 1 support person aged 18 or older with them. For treatment visits, patients cannot have anyone with them due to current Covid guidelines and our immunocompromised population.   The chemotherapy medication bag should finish at 46 hours, 96 hours, or 7 days. For example, if your pump is scheduled for 46 hours and it was put on at 4:00 p.m., it should finish at 2:00 p.m. the day it is scheduled to   come off regardless of your appointment time.     Estimated time to finish at 11/4 @ 1:00 pm.   If the display on your pump reads "Low Volume" and it is beeping, take the batteries out of the pump and come to the cancer center for it to be taken off.   If the pump alarms go off prior to the pump reading "Low Volume" then call [phone] and  someone can assist you.  If the plunger comes out and the chemotherapy medication is leaking out, please use your home chemo spill kit to clean up the spill. Do NOT use paper towels or other household products.  If you have problems or questions regarding your pump, please call either [phone] (24 hours a day) or the cancer center Monday-Friday 8:00 a.m.- 4:30 p.m. at the clinic number and we will assist you. If you are unable to get assistance, then go to the nearest Emergency Department and ask the staff to contact the IV team for assistance.    

## 2020-11-30 NOTE — Progress Notes (Signed)
[facility]   [phone] [phone]   Clinic Follow up Note   Patient Care Team: Sid Falcon, MD as PCP - General (Internal Medicine) Forrest Moron, OD (Optometry) Jonnie Finner, RN (Inactive) as Oncology Nurse Navigator Truitt Merle, MD as Consulting Physician (Oncology) Gwyndolyn Kaufman, RN (Inactive) as Registered Nurse  Date of Service:  11/30/2020  CHIEF COMPLAINT: f/u of pancreatic cancer with liver metastasis  CURRENT THERAPY:  PENDING FOLFOX, to start 12/15/20  ASSESSMENT & PLAN:  Mariah Henderson is a 70 y.o. female with   1. Pancreatic cancer with Liver metastasis, stage IV  -Her CT AP from 01/13/20 showed 2cm mass on tail of pancreas. The mass abuts the posterior gastric wall but without definite gastric invasion. Scan also shows multiple (at least 5) hepatic masses highly suspicious for metastatic lesions and Questionable 0.8 by 0.8 cm omental tumor nodule versus dense diverticulum above the transverse colon. -Her liver biopsy from 01/28/20 showed adenocarcinoma from pancreatic primary. Her 02/11/20 CT Chest showed no metastasis  -I started her on first line chemo with gemcitabine and Abraxane every 2 weeks beginning 02/26/20. D/c after 05/19/20 due to disease progression in liver and chemo related neuropathy. -she is now on second-line FOLFIRI q2weeks starting 06/02/20. she is tolerating very well -restaging CT CAP 11/25/20 showed stable to slightly decreased size of primary pancreatic tail mass, but interval progression of a liver mass, splenic parenchyma, and a LLL nodule. I reviewed with the patient and her daughter today. -I recommend changing treatment to FOLFOX. I explained the differences from FOLFIRI and indications for the change. --Chemotherapy consent: Side effects including but does not not limited to, fatigue, nausea, vomiting, diarrhea, hair loss, cold sensitivity and neuropathy, fluid retention, renal and kidney  dysfunction, neutropenic fever, needed for blood transfusion, bleeding, were discussed with patient in great detail. She agrees to proceed. -the goal of chemo is palliative  -she will proceed to FOLFIRI as scheduled today, then switch to FOLFOX at her next visit on 12/15/20.   2. Anemia -her hg has been slowly going down since 07/2020, down to 8.8 today (11/30/20)   3. Comorbidities: Asthma, Borderline Diabetic, GERD, Heart murmur, HLD, HTN, arthritis -On medications, including multiple diuretics. -Managed by PCP.    4. Social support -She lives alone in 1 level house and she is retired. She has 1 adult son who lives in [place]. She has a twin and 3 other sisters who live in [place], and other siblings and relative in [place].  -Her son helps her at home  -She has approved for grant to help with medication. Her sister will notify me of the type of grant.    6. Goal of care discussion -The patient understands the goal of care is palliative. -I recommend DNR/DNI, she will think about it    7. Hypokalemia  -She is on Lasix and dieretics for her LE edema and heart.  -continue oral K   8. Genetic testing from 02/19/20 was negative for pathogenetic mutation with VUS of gene NF1   9. 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 She takes it at night as needed. Given the continued neuropathy in her fingers, I recommend she take it regularly. -Continue monitoring     PLAN:  -proceed to FOLFIRI today at reduced dose  -lab, flush, f/u with NP Lacie, and start FOLFOX 12/15/20   No problem-specific Assessment & Plan notes found for  this encounter.   SUMMARY OF ONCOLOGIC HISTORY: Oncology History Overview Note  Cancer Staging Pancreatic cancer [facility]) Staging form: Exocrine Pancreas, AJCC 8th Edition - Clinical: Stage IV (cT1c, cN0, pM1) - Signed by Truitt Merle, MD on 02/26/2020 Total positive nodes: 0    Pancreatic cancer  ([place])  01/13/2020 Imaging   US Abdomen 01/13/20  IMPRESSION: 1. Suggestion of 3 vascular hypoechoic mass within the liver measuring up to 2.5 cm. 2. Suggestion of a hypoechoic mass within the tail of the pancreas that is not well visualized. 3. Findings concerning for malignancy, please see separately dictated CT abdomen pelvis 01/13/2020.   01/13/2020 Imaging   CT AP 01/13/20  IMPRESSION: 1. Hypoenhancing 2.0 cm in long axis mass in the pancreatic tail compatible with pancreatic adenocarcinoma. The mass abuts the posterior gastric wall but without definite gastric invasion. The splenic vein appears narrowed/attenuated compared to prior exams and the lesion abuts the margin of the splenic artery. 2. Hypoenhancing hepatic masses are new compared to 11/22/2017 and are highly suspicious for metastatic lesions. 3. Questionable 0.8 by 0.8 cm omental tumor nodule versus dense diverticulum above the transverse colon. 4. Other imaging findings of potential clinical significance: Mild distal esophageal wall thickening, query esophagitis. Stable left adrenal adenoma. Sigmoid colon diverticulosis. Lumbar and thoracic spondylosis and degenerative disc disease contributing to multilevel impingement. 5. Aortic atherosclerosis. Aortic Atherosclerosis (ICD10-I70.0).   01/21/2020 Initial Diagnosis   Pancreatic cancer ([place])   01/28/2020 Initial Biopsy   FINAL MICROSCOPIC DIAGNOSIS:   A. LIVER, NEEDLE CORE BIOPSY:  - Adenocarcinoma.   COMMENT:   Immunohistochemistry for CK7 is positive.  CDX-2 demonstrates weak to  moderate positive staining.  TTF-1 and PAX 8 demonstrate weak, likely  nonspecific staining.  CK20, GATA-3 and ER are negative.  The  morphologic and immunophenotypic characteristics are compatible with the  clinical impression of a primary pancreatic cancer.  Radiologic  correlation is encouraged.  If applicable, there is likely sufficient  tissue for ancillary studies (Block  A2).  Dr. Vic Ripper reviewed the  case.    02/26/2020 - 05/19/2020 Chemotherapy   First-line Gemcitabine and Abraxane every 2 weeks starting 02/26/20. D/c after 05/19/20 due to disease progression in liver and neuropathy     02/26/2020 Cancer Staging   Staging form: Exocrine Pancreas, AJCC 8th Edition - Clinical: Stage IV (cT1c, cN0, pM1) - Signed by Truitt Merle, MD on 02/26/2020 Total positive nodes: 0    03/08/2020 Genetic Testing   Negative hereditary cancer genetic testing: no pathogenic variants detected in [facility] Common Hereditary Cancers Panel with preliminary evidence pancreatic cancer genes.  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 The report date is March 08, 2020.    The Common Hereditary Cancers Panel with preliminary evidence pancreatic cancer genes offered by [facility] includes sequencing and/or deletion duplication testing of the following 49 genes: APC, ATM, AXIN2, BARD1, BMPR1A, BRCA1, BRCA2, BRIP1, CDH1, CDK4, CDKN2A (p14ARF), CDKN2A (p16INK4a), CHEK2, CTNNA1, DICER1, EPCAM (Deletion/duplication testing only), GREM1 (promoter region deletion/duplication testing only), FANCC, GREM1, HOXB13, KIT, MEN1, MLH1, MSH2, MSH3, MSH6, MUTYH, NBN, NF1, NHTL1, PALB2, PALLD, PDGFRA, PMS2, POLD1, POLE, PTEN, RAD50, RAD51C, RAD51D, SDHA, SDHB, SDHC, SDHD, SMAD4, SMARCA4. STK11, TP53, TSC1, TSC2, and VHL.  The following genes were evaluated for sequence changes only: SDHA and HOXB13 c.251G>A variant only.es only: SDHA and HOXB13 c.251G>A variant only.   05/03/2020 Imaging   CT AP  IMPRESSION: 1. Interval decrease in size of a hypodense mass of the central pancreatic tail, consistent with  treatment response of primary pancreatic malignancy. 2. Numerous low-attenuation liver lesions are seen, increased in size and number compared to prior examination. Findings are consistent with worsened hepatic metastatic disease. 3. A previously queried omental nodule adjacent  to the transverse colon is more clearly a prominent diverticulum on current examination.   Aortic Atherosclerosis (ICD10-I70.0).   06/02/2020 -  Chemotherapy   Second-line FOLFIRI q2weeks starting 06/02/20       INTERVAL HISTORY:  Leyla Soliz is here for a follow up of metastatic pancreatic cancer. She was last seen by me on 11/17/20. She presents to the clinic accompanied by her niece. She reports she is feeling very well.   All other systems were reviewed with the patient and are negative.  MEDICAL HISTORY:  Past Medical History:  Diagnosis Date   Acute medial meniscus tear    right knee   Anxiety    Asthma    Cancer ([place])    Cervical spondylosis    Chronic serous otitis media    Constipation    COPD (chronic obstructive pulmonary disease) (HCC)    Depression    followed by [name]; no longer of depakote, seroquel   Diabetes mellitus without complication ([place])    Dysuria 12/27/2009   Qualifier: Diagnosis of  By: [name] MD, Amanjot     Foot drop    GERD (gastroesophageal reflux disease)    Heart murmur    Hyperlipidemia    Hypertension    Low back pain    Obesity    Paraumbilical hernia    Pre-diabetes     SURGICAL HISTORY: Past Surgical History:  Procedure Laterality Date   ABDOMINAL HYSTERECTOMY     ANTERIOR CERVICAL DECOMP/DISCECTOMY FUSION N/A 10/05/2014   Procedure: ACDF - C5-C6 - C6-C7;  Surgeon: Karie Chimera, MD;  Location: [facility];  Service: Neurosurgery;  Laterality: N/A;  ACDF - C5-C6 - C6-C7   CHOLECYSTECTOMY     COLONOSCOPY     FOOT SURGERY     HERNIA REPAIR  1999   IR IMAGING GUIDED PORT INSERTION  02/25/2020   IR US GUIDANCE  01/28/2020   KNEE ARTHROSCOPY WITH MEDIAL MENISECTOMY Right 06/14/2016   Procedure: RIGHT KNEE ARTHROSCOPY WITH PARTIAL MEDIAL MENISCECTOMY, SUBCHONDROPLASTY;  Surgeon: Leandrew Koyanagi, MD;  Location: [facility];  Service: Orthopedics;  Laterality: Right;   KNEE ARTHROSCOPY WITH SUBCHONDROPLASTY Right  06/14/2016   Procedure: KNEE ARTHROSCOPY WITH SUBCHONDROPLASTY;  Surgeon: Leandrew Koyanagi, MD;  Location: [facility];  Service: Orthopedics;  Laterality: Right;   RADIOACTIVE SEED GUIDED EXCISIONAL BREAST BIOPSY Left 06/23/2014   Procedure: RADIOACTIVE SEED GUIDED EXCISIONAL BREAST BIOPSY;  Surgeon: Stark Klein, MD;  Location: [facility];  Service: General;  Laterality: Left;    I have reviewed the social history and family history with the patient and they are unchanged from previous note.  ALLERGIES:  has No Known Allergies.  MEDICATIONS:  Current Outpatient Medications  Medication Sig Dispense Refill   glipiZIDE (GLUCOTROL XL) 2.5 MG 24 hr tablet TAKE 1 TABLET(2.5 MG) BY MOUTH DAILY WITH BREAKFAST 30 tablet 2   Accu-Chek Softclix Lancets lancets Check blood sugar once a day as instructed 102 each 3   albuterol (PROVENTIL) (2.5 MG/3ML) 0.083% nebulizer solution Take 3 mLs (2.5 mg total) by nebulization every 6 (six) hours as needed for wheezing. 1080 mL 3   albuterol (VENTOLIN HFA) 108 (90 Base) MCG/ACT inhaler INHALE 1 TO 2 PUFFS INTO THE LUNGS EVERY 6 HOURS AS  NEEDED FOR WHEEZING OR SHORTNESS OF BREATH 54 g 1   alendronate (FOSAMAX) 70 MG tablet TAKE 1 TABLET BY MOUTH EVERY 7 DAYS. TAKE WITH A FULL GLASS OF WATER ON AN EMPTY STOMACH. 12 tablet 3   atorvastatin (LIPITOR) 40 MG tablet TAKE 1 TABLET BY MOUTH EVERY DAY 90 tablet 3   benazepril (LOTENSIN) 20 MG tablet TAKE 1 TABLET(20 MG) BY MOUTH DAILY 90 tablet 3   Blood Glucose Monitoring Suppl (ONETOUCH VERIO REFLECT) w/Device KIT Check blood sugar once a day as instructed 1 kit 0   calcium carbonate (OS-CAL) 600 MG TABS tablet Take by mouth.     celecoxib (CELEBREX) 100 MG capsule TAKE 1 CAPSULE BY MOUTH  TWICE DAILY 180 capsule 3   cholecalciferol (VITAMIN D) 1000 units tablet Take 2,000 Units by mouth daily.     diclofenac Sodium (VOLTAREN) 1 % GEL Apply 2 g topically 4 (four) times daily. 100 g 2   famotidine  (PEPCID) 40 MG tablet TAKE 1/2 TABLET(20 MG) BY MOUTH TWICE DAILY 30 tablet 3   fluticasone (FLONASE) 50 MCG/ACT nasal spray Place 1 spray into both nostrils daily. 16 g 3   Fluticasone-Salmeterol (ADVAIR DISKUS) 100-50 MCG/DOSE AEPB Inhale 1 puff into the lungs 2 (two) times daily. 60 each 11   furosemide (LASIX) 20 MG tablet TAKE 1 TABLET BY MOUTH EVERY DAY ON DAY OF CHEMO AND WHILE 5FU INFUSING THEN TAKE 1/2 TABLET DAILY 30 tablet 1   gabapentin (NEURONTIN) 100 MG capsule TAKE 1 CAPSULE(100 MG) BY MOUTH THREE TIMES DAILY 90 capsule 1   gabapentin (NEURONTIN) 300 MG capsule Take 1 capsule (300 mg total) by mouth 2 (two) times daily. 60 capsule 3   glucose blood (ONETOUCH VERIO) test strip Check blood sugar once a day as instructed 100 each 3   hydrochlorothiazide (MICROZIDE) 12.5 MG capsule TAKE 1 CAPSULE(12.5 MG) BY MOUTH DAILY 90 capsule 1   HYDROcodone-acetaminophen (NORCO/VICODIN) 5-325 MG tablet Take 1 tablet by mouth 2 (two) times daily as needed for severe pain. 60 tablet 0   ipratropium (ATROVENT) 0.02 % nebulizer solution Take 0.5 mg by nebulization every 6 (six) hours as needed for wheezing or shortness of breath.     latanoprost (XALATAN) 0.005 % ophthalmic solution PLACE 1 DROP INTO BOTH EYES AT BEDTIME  0   loperamide (LOPERAMIDE A-D) 2 MG tablet Take 1 tablet (2 mg total) by mouth 2 (two) times daily as needed for diarrhea or loose stools. 6 tablet 0   loratadine (CLARITIN) 10 MG tablet Take 1 tablet (10 mg total) by mouth daily. 30 tablet 11   magic mouthwash w/lidocaine SOLN Take 5 mLs by mouth 4 (four) times daily as needed for mouth pain. 480 mL 2   meclizine (ANTIVERT) 12.5 MG tablet TAKE 1 TABLET(12.5 MG) BY MOUTH TWICE DAILY AS NEEDED FOR DIZZINESS 15 tablet 3   potassium chloride SA (KLOR-CON) 20 MEQ tablet TAKE 1 TABLET BY MOUTH THREE TIMES DAILY 270 tablet 0   prochlorperazine (COMPAZINE) 10 MG tablet TAKE 1 TABLET(10 MG) BY MOUTH EVERY 6 HOURS AS NEEDED FOR NAUSEA OR  VOMITING 30 tablet 3   risperiDONE (RISPERDAL) 0.25 MG tablet Take 0.25 mg by mouth daily as needed.     rivaroxaban (XARELTO) 20 MG TABS tablet Take 1 tablet (20 mg total) by mouth daily with supper. 30 tablet 3   sertraline (ZOLOFT) 50 MG tablet Take 50 mg by mouth daily.     traZODone (DESYREL) 50 MG tablet Take 25-50 mg  by mouth at bedtime as needed for sleep.      triamcinolone cream (KENALOG) 0.1 % apply to affected area twice a day 454 g 1   No current facility-administered medications for this visit.   Facility-Administered Medications Ordered in Other Visits  Medication Dose Route Frequency Provider Last Rate Last Admin   fluorouracil (ADRUCIL) 3,300 mg in sodium chloride 0.9 % 84 mL chemo infusion  2,000 mg/m2 (Treatment Plan Recorded) Intravenous 1 day or 1 dose Truitt Merle, MD   3,300 mg at 11/30/20 1602   heparin lock flush 100 unit/mL  500 Units Intracatheter Once PRN Truitt Merle, MD       sodium chloride flush (NS) 0.9 % injection 10 mL  10 mL Intracatheter PRN Truitt Merle, MD        PHYSICAL EXAMINATION: ECOG PERFORMANCE STATUS: 1 - Symptomatic but completely ambulatory  Vitals:   11/30/20 1146  BP: 134/64  Pulse: 72  Resp: 18  Temp: 98.8 F (37.1 C)  SpO2: 99%   Wt Readings from Last 3 Encounters:  11/30/20 141 lb 11.2 oz (64.3 kg)  11/17/20 139 lb 12 oz (63.4 kg)  11/08/20 143 lb (64.9 kg)     GENERAL:alert, no distress and comfortable SKIN: skin color normal, no rashes or significant lesions EYES: normal, Conjunctiva are pink and non-injected, sclera clear  NEURO: alert & oriented x 3 with fluent speech  LABORATORY DATA:  I have reviewed the data as listed CBC Latest Ref Rng & Units 11/30/2020 11/17/2020 11/03/2020  WBC 4.0 - 10.5 K/uL 3.7(L) 3.8(L) 3.8(L)  Hemoglobin 12.0 - 15.0 g/dL 8.8(L) 9.8(L) 10.0(L)  Hematocrit 36.0 - 46.0 % 27.5(L) 30.8(L) 30.8(L)  Platelets 150 - 400 K/uL 128(L) 137(L) 131(L)     CMP Latest Ref Rng & Units 11/30/2020 11/17/2020  11/03/2020  Glucose 70 - 99 mg/dL 225(H) 135(H) 191(H)  BUN 8 - 23 mg/dL '13 11 9  ' Creatinine 0.44 - 1.00 mg/dL 0.76 0.75 0.76  Sodium 135 - 145 mmol/L 139 142 140  Potassium 3.5 - 5.1 mmol/L 3.8 4.0 3.9  Chloride 98 - 111 mmol/L 104 107 105  CO2 22 - 32 mmol/L '28 28 26  ' Calcium 8.9 - 10.3 mg/dL 8.8(L) 8.7(L) 9.0  Total Protein 6.5 - 8.1 g/dL 5.7(L) 6.1(L) 6.0(L)  Total Bilirubin 0.3 - 1.2 mg/dL 0.4 0.5 0.5  Alkaline Phos 38 - 126 U/L 108 123 116  AST 15 - 41 U/L 31 36 32  ALT 0 - 44 U/L '27 26 25      ' RADIOGRAPHIC STUDIES: I have personally reviewed the radiological images as listed and agreed with the findings in the report. No results found.    No orders of the defined types were placed in this encounter.  All questions were answered. The patient knows to call the clinic with any problems, questions or concerns. No barriers to learning was detected. The total time spent in the appointment was 40 minutes.     Truitt Merle, MD 11/30/2020   I, Wilburn Mylar, am acting as scribe for Truitt Merle, MD.   I have reviewed the above documentation for accuracy and completeness, and I agree with the above.

## 2020-12-01 ENCOUNTER — Ambulatory Visit: Payer: Medicare Other | Admitting: Hematology

## 2020-12-01 ENCOUNTER — Other Ambulatory Visit: Payer: Medicare Other

## 2020-12-01 ENCOUNTER — Ambulatory Visit: Payer: Medicare Other

## 2020-12-02 ENCOUNTER — Other Ambulatory Visit: Payer: Self-pay

## 2020-12-02 ENCOUNTER — Inpatient Hospital Stay: Payer: Medicare Other

## 2020-12-02 DIAGNOSIS — C787 Secondary malignant neoplasm of liver and intrahepatic bile duct: Secondary | ICD-10-CM | POA: Diagnosis not present

## 2020-12-02 DIAGNOSIS — Z452 Encounter for adjustment and management of vascular access device: Secondary | ICD-10-CM | POA: Diagnosis not present

## 2020-12-02 DIAGNOSIS — Z7189 Other specified counseling: Secondary | ICD-10-CM

## 2020-12-02 DIAGNOSIS — Z5111 Encounter for antineoplastic chemotherapy: Secondary | ICD-10-CM | POA: Diagnosis not present

## 2020-12-02 DIAGNOSIS — C252 Malignant neoplasm of tail of pancreas: Secondary | ICD-10-CM | POA: Diagnosis not present

## 2020-12-02 MED ORDER — SODIUM CHLORIDE 0.9% FLUSH
10.0000 mL | INTRAVENOUS | Status: DC | PRN
  Administered 2020-12-02: 13:00:00 10 mL

## 2020-12-02 MED ORDER — HEPARIN SOD (PORK) LOCK FLUSH 100 UNIT/ML IV SOLN
500.0000 [IU] | Freq: Once | INTRAVENOUS | Status: AC | PRN
  Administered 2020-12-02: 13:00:00 500 [IU]

## 2020-12-03 ENCOUNTER — Encounter: Payer: Self-pay | Admitting: Hematology

## 2020-12-03 DIAGNOSIS — C252 Malignant neoplasm of tail of pancreas: Secondary | ICD-10-CM | POA: Diagnosis not present

## 2020-12-03 NOTE — Progress Notes (Signed)
DISCONTINUE ON PATHWAY REGIMEN - Pancreatic Adenocarcinoma     A cycle is every 14 days:     Liposomal irinotecan      Leucovorin      Fluorouracil   **Always confirm dose/schedule in your pharmacy ordering system**  REASON: Disease Progression PRIOR TREATMENT: PANOS74: Liposomal Irinotecan 70 mg/m2 + Fluorouracil 2,400 mg/m2 CIV + Leucovorin 400 mg/m2 q14 Days TREATMENT RESPONSE: Partial Response (PR)  START ON PATHWAY REGIMEN - Pancreatic Adenocarcinoma     A cycle is every 14 days:     Oxaliplatin      Leucovorin      Fluorouracil      Fluorouracil   **Always confirm dose/schedule in your pharmacy ordering system**  Patient Characteristics: Metastatic Disease, Third Line and Beyond, MSS/pMMR or MSI Unknown Therapeutic Status: Metastatic Disease Line of Therapy: Third Engineer, civil (consulting) Status: MSS/pMMR Intent of Therapy: Non-Curative / Palliative Intent, Discussed with Patient

## 2020-12-06 NOTE — Progress Notes (Signed)
Internal Medicine Clinic Attending  Case discussed with Dr. Amponsah  At the time of the visit.  We reviewed the resident's history and exam and pertinent patient test results.  I agree with the assessment, diagnosis, and plan of care documented in the resident's note.  

## 2020-12-07 NOTE — Progress Notes (Signed)
Pharmacist Chemotherapy Monitoring - Initial Assessment    Anticipated start date: 12/15/20   The following has been reviewed per standard work regarding the patient's treatment regimen: The patient's diagnosis, treatment plan and drug doses, and organ/hematologic function Lab orders and baseline tests specific to treatment regimen  The treatment plan start date, drug sequencing, and pre-medications Prior authorization status  Patient's documented medication list, including drug-drug interaction screen and prescriptions for anti-emetics and supportive care specific to the treatment regimen The drug concentrations, fluid compatibility, administration routes, and timing of the medications to be used The patient's access for treatment and lifetime cumulative dose history, if applicable  The patient's medication allergies and previous infusion related reactions, if applicable   Changes made to treatment plan:  N/A  Follow up needed:  Pending authorization for treatment     Kennith Center, Pharm.D., CPP 12/07/2020@9 :28 AM

## 2020-12-12 NOTE — Progress Notes (Signed)
Mariah Henderson   [phone] [phone]   Clinic Follow up Note   Patient Care Team: Sid Falcon, MD as PCP - General (Internal Medicine) Forrest Moron, [place] (Optometry) Jonnie Finner, RN (Inactive) as Oncology Nurse Navigator Truitt Merle, MD as Consulting Physician (Oncology) Gwyndolyn Kaufman, RN (Inactive) as Registered Nurse 12/15/2020  CHIEF COMPLAINT: Follow up pancreatic cancer   SUMMARY OF ONCOLOGIC HISTORY: Oncology History Overview Note  Cancer Staging Pancreatic cancer [facility]) Staging form: Exocrine Pancreas, AJCC 8th Edition - Clinical: Stage IV (cT1c, cN0, pM1) - Signed by Truitt Merle, MD on 02/26/2020 Total positive nodes: 0    Pancreatic cancer ([place])  01/13/2020 Imaging   US Abdomen 01/13/20  IMPRESSION: 1. Suggestion of 3 vascular hypoechoic mass within the liver measuring up to 2.5 cm. 2. Suggestion of a hypoechoic mass within the tail of the pancreas that is not well visualized. 3. Findings concerning for malignancy, please see separately dictated CT abdomen pelvis 01/13/2020.   01/13/2020 Imaging   CT AP 01/13/20  IMPRESSION: 1. Hypoenhancing 2.0 cm in long axis mass in the pancreatic tail compatible with pancreatic adenocarcinoma. The mass abuts the posterior gastric wall but without definite gastric invasion. The splenic vein appears narrowed/attenuated compared to prior exams and the lesion abuts the margin of the splenic artery. 2. Hypoenhancing hepatic masses are new compared to 11/22/2017 and are highly suspicious for metastatic lesions. 3. Questionable 0.8 by 0.8 cm omental tumor nodule versus dense diverticulum above the transverse colon. 4. Other imaging findings of potential clinical significance: Mild distal esophageal wall thickening, query esophagitis. Stable left adrenal adenoma. Sigmoid colon diverticulosis. Lumbar and thoracic spondylosis and degenerative disc disease contributing to  multilevel impingement. 5. Aortic atherosclerosis. Aortic Atherosclerosis (ICD10-I70.0).   01/21/2020 Initial Diagnosis   Pancreatic cancer ([place])   01/28/2020 Initial Biopsy   FINAL MICROSCOPIC DIAGNOSIS:   A. LIVER, NEEDLE CORE BIOPSY:  - Adenocarcinoma.   COMMENT:   Immunohistochemistry for CK7 is positive.  CDX-2 demonstrates weak to  moderate positive staining.  TTF-1 and PAX 8 demonstrate weak, likely  nonspecific staining.  CK20, GATA-3 and ER are negative.  The  morphologic and immunophenotypic characteristics are compatible with the  clinical impression of a primary pancreatic cancer.  Radiologic  correlation is encouraged.  If applicable, there is likely sufficient  tissue for ancillary studies (Block A2).  Dr. Vic Ripper reviewed the  case.    02/26/2020 - 05/19/2020 Chemotherapy   First-line Gemcitabine and Abraxane every 2 weeks starting 02/26/20. D/c after 05/19/20 due to disease progression in liver and neuropathy     02/26/2020 Cancer Staging   Staging form: Exocrine Pancreas, AJCC 8th Edition - Clinical: Stage IV (cT1c, cN0, pM1) - Signed by Truitt Merle, MD on 02/26/2020 Total positive nodes: 0    03/08/2020 Genetic Testing   Negative hereditary cancer genetic testing: no pathogenic variants detected in [facility] Common Hereditary Cancers Panel with preliminary evidence pancreatic cancer genes.  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 The report date is March 08, 2020.    The Common Hereditary Cancers Panel with preliminary evidence pancreatic cancer genes offered by [facility] includes sequencing and/or deletion duplication testing of the following 49 genes: APC, ATM, AXIN2, BARD1, BMPR1A, BRCA1, BRCA2, BRIP1, CDH1, CDK4, CDKN2A (p14ARF), CDKN2A (p16INK4a), CHEK2, CTNNA1, DICER1, EPCAM (Deletion/duplication testing only), GREM1 (promoter region deletion/duplication testing only), FANCC, GREM1, HOXB13, KIT, MEN1, MLH1, MSH2, MSH3, MSH6, MUTYH,  NBN, NF1, NHTL1, PALB2, PALLD, PDGFRA, PMS2, POLD1,  POLE, PTEN, RAD50, RAD51C, RAD51D, SDHA, SDHB, SDHC, SDHD, SMAD4, SMARCA4. STK11, TP53, TSC1, TSC2, and VHL.  The following genes were evaluated for sequence changes only: SDHA and HOXB13 c.251G>A variant only.es only: SDHA and HOXB13 c.251G>A variant only.   05/03/2020 Imaging   CT AP  IMPRESSION: 1. Interval decrease in size of a hypodense mass of the central pancreatic tail, consistent with treatment response of primary pancreatic malignancy. 2. Numerous low-attenuation liver lesions are seen, increased in size and number compared to prior examination. Findings are consistent with worsened hepatic metastatic disease. 3. A previously queried omental nodule adjacent to the transverse colon is more clearly a prominent diverticulum on current examination.   Aortic Atherosclerosis (ICD10-I70.0).   06/02/2020 -  Chemotherapy   Second-line FOLFIRI q2weeks starting 06/02/20    12/15/2020 -  Chemotherapy   Patient is on Treatment Plan : PANCREAS FOLFOX q14d       CURRENT THERAPY: PENDING third line FOLFOX q2 weeks, dose reduced oxali and 5FU  INTERVAL HISTORY:  Mariah Henderson returns for follow up and treatment as scheduled. Last seen 11/30/20 for last cycle FOLFIRI.  She tolerated treatment well with no side effects.  Energy and appetite are adequate, denies nausea/vomiting.  Bowels moving well.  Denies new or worsening abdominal pain or bloating.  Her legs are swollen again, no calf pain.  Denies cough, chest pain, dyspnea.  She has existing neuropathy that never goes away, intensity fluctuates.  She does drop some things and has to have assistance with buttoning.  She takes gabapentin 3 times daily.  Otherwise, denies fever, chills, or any other new specific complaints.   MEDICAL HISTORY:  Past Medical History:  Diagnosis Date   Acute medial meniscus tear    right knee   Anxiety    Asthma    Cancer ([place])    Cervical spondylosis     Chronic serous otitis media    Constipation    COPD (chronic obstructive pulmonary disease) (HCC)    Depression    followed by [name]; no longer of depakote, seroquel   Diabetes mellitus without complication ([place])    Dysuria 12/27/2009   Qualifier: Diagnosis of  By: [name] MD, Amanjot     Foot drop    GERD (gastroesophageal reflux disease)    Heart murmur    Hyperlipidemia    Hypertension    Low back pain    Obesity    Paraumbilical hernia    Pre-diabetes     SURGICAL HISTORY: Past Surgical History:  Procedure Laterality Date   ABDOMINAL HYSTERECTOMY     ANTERIOR CERVICAL DECOMP/DISCECTOMY FUSION N/A 10/05/2014   Procedure: ACDF - C5-C6 - C6-C7;  Surgeon: Karie Chimera, MD;  Location: [place] [facility];  Service: Neurosurgery;  Laterality: N/A;  ACDF - C5-C6 - C6-C7   CHOLECYSTECTOMY     COLONOSCOPY     FOOT SURGERY     HERNIA REPAIR  1999   IR IMAGING GUIDED PORT INSERTION  02/25/2020   IR US GUIDANCE  01/28/2020   KNEE ARTHROSCOPY WITH MEDIAL MENISECTOMY Right 06/14/2016   Procedure: RIGHT KNEE ARTHROSCOPY WITH PARTIAL MEDIAL MENISCECTOMY, SUBCHONDROPLASTY;  Surgeon: Leandrew Koyanagi, MD;  Location: [facility];  Service: Orthopedics;  Laterality: Right;   KNEE ARTHROSCOPY WITH SUBCHONDROPLASTY Right 06/14/2016   Procedure: KNEE ARTHROSCOPY WITH SUBCHONDROPLASTY;  Surgeon: Leandrew Koyanagi, MD;  Location: [facility];  Service: Orthopedics;  Laterality: Right;   RADIOACTIVE SEED GUIDED EXCISIONAL BREAST BIOPSY Left 06/23/2014  Procedure: RADIOACTIVE SEED GUIDED EXCISIONAL BREAST BIOPSY;  Surgeon: Stark Klein, MD;  Location: [facility];  Service: General;  Laterality: Left;    I have reviewed the social history and family history with the patient and they are unchanged from previous note.  ALLERGIES:  has No Known Allergies.  MEDICATIONS:  Current Outpatient Medications  Medication Sig Dispense Refill   glipiZIDE (GLUCOTROL XL) 2.5 MG 24 hr  tablet TAKE 1 TABLET(2.5 MG) BY MOUTH DAILY WITH BREAKFAST 30 tablet 2   Accu-Chek Softclix Lancets lancets Check blood sugar once a day as instructed 102 each 3   albuterol (PROVENTIL) (2.5 MG/3ML) 0.083% nebulizer solution Take 3 mLs (2.5 mg total) by nebulization every 6 (six) hours as needed for wheezing. 1080 mL 3   albuterol (VENTOLIN HFA) 108 (90 Base) MCG/ACT inhaler INHALE 1 TO 2 PUFFS INTO THE LUNGS EVERY 6 HOURS AS NEEDED FOR WHEEZING OR SHORTNESS OF BREATH 54 g 1   alendronate (FOSAMAX) 70 MG tablet TAKE 1 TABLET BY MOUTH EVERY 7 DAYS. TAKE WITH A FULL GLASS OF WATER ON AN EMPTY STOMACH. 12 tablet 3   atorvastatin (LIPITOR) 40 MG tablet TAKE 1 TABLET BY MOUTH EVERY DAY 90 tablet 3   benazepril (LOTENSIN) 20 MG tablet TAKE 1 TABLET(20 MG) BY MOUTH DAILY 90 tablet 3   Blood Glucose Monitoring Suppl (ONETOUCH VERIO REFLECT) w/Device KIT Check blood sugar once a day as instructed 1 kit 0   calcium carbonate (OS-CAL) 600 MG TABS tablet Take by mouth.     celecoxib (CELEBREX) 100 MG capsule TAKE 1 CAPSULE BY MOUTH  TWICE DAILY 180 capsule 3   cholecalciferol (VITAMIN D) 1000 units tablet Take 2,000 Units by mouth daily.     diclofenac Sodium (VOLTAREN) 1 % GEL Apply 2 g topically 4 (four) times daily. 100 g 2   famotidine (PEPCID) 40 MG tablet TAKE 1/2 TABLET(20 MG) BY MOUTH TWICE DAILY 30 tablet 3   fluticasone (FLONASE) 50 MCG/ACT nasal spray Place 1 spray into both nostrils daily. 16 g 3   Fluticasone-Salmeterol (ADVAIR DISKUS) 100-50 MCG/DOSE AEPB Inhale 1 puff into the lungs 2 (two) times daily. 60 each 11   furosemide (LASIX) 20 MG tablet TAKE 1 TABLET BY MOUTH EVERY DAY ON DAY OF CHEMO AND WHILE 5FU INFUSING THEN TAKE 1/2 TABLET DAILY 30 tablet 1   gabapentin (NEURONTIN) 100 MG capsule TAKE 1 CAPSULE(100 MG) BY MOUTH THREE TIMES DAILY 90 capsule 1   gabapentin (NEURONTIN) 300 MG capsule Take 1 capsule (300 mg total) by mouth 2 (two) times daily. 60 capsule 3   glucose blood (ONETOUCH  VERIO) test strip Check blood sugar once a day as instructed 100 each 3   hydrochlorothiazide (MICROZIDE) 12.5 MG capsule TAKE 1 CAPSULE(12.5 MG) BY MOUTH DAILY 90 capsule 1   HYDROcodone-acetaminophen (NORCO/VICODIN) 5-325 MG tablet Take 1 tablet by mouth 2 (two) times daily as needed for severe pain. 60 tablet 0   ipratropium (ATROVENT) 0.02 % nebulizer solution Take 0.5 mg by nebulization every 6 (six) hours as needed for wheezing or shortness of breath.     latanoprost (XALATAN) 0.005 % ophthalmic solution PLACE 1 DROP INTO BOTH EYES AT BEDTIME  0   loperamide (LOPERAMIDE A-D) 2 MG tablet Take 1 tablet (2 mg total) by mouth 2 (two) times daily as needed for diarrhea or loose stools. 6 tablet 0   loratadine (CLARITIN) 10 MG tablet Take 1 tablet (10 mg total) by mouth daily. 30 tablet 11  magic mouthwash w/lidocaine SOLN Take 5 mLs by mouth 4 (four) times daily as needed for mouth pain. 480 mL 2   meclizine (ANTIVERT) 12.5 MG tablet TAKE 1 TABLET(12.5 MG) BY MOUTH TWICE DAILY AS NEEDED FOR DIZZINESS 15 tablet 3   potassium chloride SA (KLOR-CON) 20 MEQ tablet TAKE 1 TABLET BY MOUTH THREE TIMES DAILY 270 tablet 0   prochlorperazine (COMPAZINE) 10 MG tablet TAKE 1 TABLET(10 MG) BY MOUTH EVERY 6 HOURS AS NEEDED FOR NAUSEA OR VOMITING 30 tablet 3   risperiDONE (RISPERDAL) 0.25 MG tablet Take 0.25 mg by mouth daily as needed.     rivaroxaban (XARELTO) 20 MG TABS tablet Take 1 tablet (20 mg total) by mouth daily with supper. 30 tablet 3   sertraline (ZOLOFT) 50 MG tablet Take 50 mg by mouth daily.     traZODone (DESYREL) 50 MG tablet Take 25-50 mg by mouth at bedtime as needed for sleep.      triamcinolone cream (KENALOG) 0.1 % apply to affected area twice a day 454 g 1   No current facility-administered medications for this visit.    PHYSICAL EXAMINATION: ECOG PERFORMANCE STATUS: 1 - Symptomatic but completely ambulatory  Vitals:   12/15/20 1015  BP: 128/63  Pulse: 87  Resp: 18  Temp: 99.4  F (37.4 C)  SpO2: 100%   Filed Weights   12/15/20 1015  Weight: 141 lb 14.4 oz (64.4 kg)    GENERAL:alert, no distress and comfortable SKIN: No rash EYES: sclera clear LUNGS: clear with normal breathing effort HEART: regular rate & rhythm, pitting bilateral lower extremity edema ABDOMEN:abdomen soft, non-tender and normal bowel sounds NEURO: alert & oriented x 3 with fluent speech, moderately decreased vibratory sense over the fingertips left > right per tuning fork exam PAC without erythema  LABORATORY DATA:  I have reviewed the data as listed CBC Latest Ref Rng & Units 12/15/2020 11/30/2020 11/17/2020  WBC 4.0 - 10.5 K/uL 4.3 3.7(L) 3.8(L)  Hemoglobin 12.0 - 15.0 g/dL 8.5(L) 8.8(L) 9.8(L)  Hematocrit 36.0 - 46.0 % 27.2(L) 27.5(L) 30.8(L)  Platelets 150 - 400 K/uL 155 128(L) 137(L)     CMP Latest Ref Rng & Units 12/15/2020 11/30/2020 11/17/2020  Glucose 70 - 99 mg/dL 208(H) 225(H) 135(H)  BUN 8 - 23 mg/dL '15 13 11  ' Creatinine 0.44 - 1.00 mg/dL 0.68 0.76 0.75  Sodium 135 - 145 mmol/L 135 139 142  Potassium 3.5 - 5.1 mmol/L 3.3(L) 3.8 4.0  Chloride 98 - 111 mmol/L 104 104 107  CO2 22 - 32 mmol/L '28 28 28  ' Calcium 8.9 - 10.3 mg/dL 8.6(L) 8.8(L) 8.7(L)  Total Protein 6.5 - 8.1 g/dL 6.0(L) 5.7(L) 6.1(L)  Total Bilirubin 0.3 - 1.2 mg/dL 0.6 0.4 0.5  Alkaline Phos 38 - 126 U/L 114 108 123  AST 15 - 41 U/L 32 31 36  ALT 0 - 44 U/L '26 27 26      ' RADIOGRAPHIC STUDIES: I have personally reviewed the radiological images as listed and agreed with the findings in the report. No results found.   ASSESSMENT & PLAN: Mariah Henderson is a 70 y.o. female with   1. Pancreatic cancer with Liver metastasis, stage IV  -Her CT AP from 01/13/20 showed 2cm mass on tail of pancreas abutting but not invading the gastric wall, with likely liver and omental metastasis.  -Her liver biopsy from 01/28/20 showed adenocarcinoma from pancreatic primary. Her 02/11/20 CT Chest showed no metastasis   -She began first-line palliative chemo with gemcitabine and  Abraxane every 2 weeks beginning 02/26/20, she progressed in the liver  - changed to second line liposomal irinotecan/leuc and 5FU beginning 06/02/20. Dose reduced for cycle 1, unfortunately CT CAP 11/25/2020 shows pulmonary progression -Pending third line FOLFOX starting today 12/15/2020, with dose reduced oxaliplatin for neuropathy.  We again reviewed potential side effects and symptom management.   2. Abdominal Pain, low appetite, 10 lbs weight loss, Transmanitis -She had abdominal pain for the month prior to cancer diagnosis. She is on Norco 5-339m BID as needed. her pain has improved since chemo  -She had sudden loss of appetite and weight loss since October 2021. -On Chemo her appetite has increased, gaining weight and eating more. -denies pain   3. Comorbidities: Asthma, Borderline Diabetic, GERD, Heart murmur, HLD, HTN, arthritis, neuropathy  -On medications, including multiple diuretics. -Managed by PCP.   -compression stockings for leg edema  -she had pre-existing neuropathy prior to treatment, worsened on chemo -referred to Dr. VMickeal Skinner improved on higher dose gabapentin    -tuning fork exam 12/15/20 shows moderately decreased vibratory sense over the fingertips L>R -We will monitor closely on oxaliplatin, I recommend to dose reduce   4. Social support -She lives alone in 1 level house and she is retired. She has 1 adult son who lives in [place] She has a twin and 3 other sisters who live [place] and other siblings and relative in [place]  -Her son will help her at home  -She has approved for grant to help with medication. Her sister will notify [place] type of grant.    5. Cytopenias -secondary to chemotherapy   -stable.    6. Goal of care discussion -The patient understands the goal of care is palliative. -currently full code    7. Hypokalemia secondary to diuretic -She is on Lasix and dieretics for her LE  edema and heart.  -Doppler 05/19/20 negative for DVT -LE edema fluctuates, increased now. Restart lasix 20 mg po once daily on days 1-3 of chemo, then 10 mg daily    8. Genetic testing from 02/19/20 was negative for pathogenetic mutation with VUS of gene NF1  Disposition: Ms. SUrestiappears stable.  She completed the last cycle of FOLFIRI, tolerated well with no significant side effects.  She has persistent neuropathy, exam shows decreased vibratory sense in the L>R fingertips.  She continues neuro onc follow-up by Dr. VMickeal Skinnerand gabapentin.  She otherwise tolerates treatment well, able to recover and function with good performance status at home.  She has developed recurrent leg edema, she will restart Lasix 20 mg p.o. once daily days 1 through 3 with chemo/5-FU pump, then reduce to 10 mg daily with potassium TID.   Labs reviewed, adequate for treatment.  Proceed with cycle 1 dose-reduced FOLFOX today as planned, with Oxaliplatin 70 mg/m2 (neuropathy), and 5FU 2000 mg/m2 for age and previous chemo. Plan and doses reviewed with Dr. [facility] pharmacy.   F/up and cycle 2 in 2 weeks.   All questions were answered. The patient knows to call the clinic with any problems, questions or concerns. No barriers to learning was detected. I spent 25 minutes counseling the patient face to face. The total time spent in the appointment was 30 minutes and more than 50% was on counseling and review of test results     LAlla Feeling NP 12/15/20

## 2020-12-14 MED FILL — Dexamethasone Sodium Phosphate Inj 100 MG/10ML: INTRAMUSCULAR | Qty: 1 | Status: AC

## 2020-12-15 ENCOUNTER — Inpatient Hospital Stay (HOSPITAL_BASED_OUTPATIENT_CLINIC_OR_DEPARTMENT_OTHER): Payer: Medicare Other | Admitting: Nurse Practitioner

## 2020-12-15 ENCOUNTER — Other Ambulatory Visit: Payer: Self-pay | Admitting: Hematology

## 2020-12-15 ENCOUNTER — Other Ambulatory Visit: Payer: Self-pay

## 2020-12-15 ENCOUNTER — Encounter: Payer: Self-pay | Admitting: Nurse Practitioner

## 2020-12-15 ENCOUNTER — Inpatient Hospital Stay: Payer: Medicare Other

## 2020-12-15 VITALS — BP 128/63 | HR 87 | Temp 99.4°F | Resp 18 | Ht 60.0 in | Wt 141.9 lb

## 2020-12-15 DIAGNOSIS — Z5111 Encounter for antineoplastic chemotherapy: Secondary | ICD-10-CM | POA: Diagnosis not present

## 2020-12-15 DIAGNOSIS — Z452 Encounter for adjustment and management of vascular access device: Secondary | ICD-10-CM | POA: Diagnosis not present

## 2020-12-15 DIAGNOSIS — C252 Malignant neoplasm of tail of pancreas: Secondary | ICD-10-CM

## 2020-12-15 DIAGNOSIS — Z7189 Other specified counseling: Secondary | ICD-10-CM

## 2020-12-15 DIAGNOSIS — C787 Secondary malignant neoplasm of liver and intrahepatic bile duct: Secondary | ICD-10-CM | POA: Diagnosis not present

## 2020-12-15 LAB — CMP (CANCER CENTER ONLY)
ALT: 26 U/L (ref 0–44)
AST: 32 U/L (ref 15–41)
Albumin: 3.1 g/dL — ABNORMAL LOW (ref 3.5–5.0)
Alkaline Phosphatase: 114 U/L (ref 38–126)
Anion gap: 3 — ABNORMAL LOW (ref 5–15)
BUN: 15 mg/dL (ref 8–23)
CO2: 28 mmol/L (ref 22–32)
Calcium: 8.6 mg/dL — ABNORMAL LOW (ref 8.9–10.3)
Chloride: 104 mmol/L (ref 98–111)
Creatinine: 0.68 mg/dL (ref 0.44–1.00)
GFR, Estimated: >60 mL/min (ref >60)
Glucose, Bld: 208 mg/dL — ABNORMAL HIGH (ref 70–99)
Potassium: 3.3 mmol/L — ABNORMAL LOW (ref 3.5–5.1)
Sodium: 135 mmol/L (ref 135–145)
Total Bilirubin: 0.6 mg/dL (ref 0.3–1.2)
Total Protein: 6 g/dL — ABNORMAL LOW (ref 6.5–8.1)

## 2020-12-15 LAB — CBC WITH DIFFERENTIAL (CANCER CENTER ONLY)
Abs Immature Granulocytes: 0.01 10*3/uL (ref 0.00–0.07)
Basophils Absolute: 0 10*3/uL (ref 0.0–0.1)
Basophils Relative: 1 %
Eosinophils Absolute: 0.2 10*3/uL (ref 0.0–0.5)
Eosinophils Relative: 5 %
HCT: 27.2 % — ABNORMAL LOW (ref 36.0–46.0)
Hemoglobin: 8.5 g/dL — ABNORMAL LOW (ref 12.0–15.0)
Immature Granulocytes: 0 %
Lymphocytes Relative: 9 %
Lymphs Abs: 0.4 10*3/uL — ABNORMAL LOW (ref 0.7–4.0)
MCH: 29.6 pg (ref 26.0–34.0)
MCHC: 31.3 g/dL (ref 30.0–36.0)
MCV: 94.8 fL (ref 80.0–100.0)
Monocytes Absolute: 0.7 10*3/uL (ref 0.1–1.0)
Monocytes Relative: 16 %
Neutro Abs: 2.9 10*3/uL (ref 1.7–7.7)
Neutrophils Relative %: 69 %
Platelet Count: 155 10*3/uL (ref 150–400)
RBC: 2.87 MIL/uL — ABNORMAL LOW (ref 3.87–5.11)
RDW: 15.9 % — ABNORMAL HIGH (ref 11.5–15.5)
WBC Count: 4.3 10*3/uL (ref 4.0–10.5)
nRBC: 0 % (ref 0.0–0.2)

## 2020-12-15 MED ORDER — SODIUM CHLORIDE 0.9% FLUSH: 10.0000 mL | INTRAVENOUS | Status: DC | PRN

## 2020-12-15 MED ORDER — SODIUM CHLORIDE 0.9 % IV SOLN: 2400.0000 mg/m2 | INTRAVENOUS | Status: DC

## 2020-12-15 MED ORDER — LEUCOVORIN CALCIUM INJECTION 350 MG
400.0000 mg/m2 | Freq: Once | INTRAVENOUS | Status: AC
  Administered 2020-12-15: 660 mg via INTRAVENOUS
  Filled 2020-12-15: qty 25

## 2020-12-15 MED ORDER — SODIUM CHLORIDE 0.9 % IV SOLN
10.0000 mg | Freq: Once | INTRAVENOUS | Status: AC
  Administered 2020-12-15: 10 mg via INTRAVENOUS
  Filled 2020-12-15: qty 10

## 2020-12-15 MED ORDER — SODIUM CHLORIDE 0.9 % IV SOLN
2000.0000 mg/m2 | INTRAVENOUS | Status: DC
  Administered 2020-12-15: 3300 mg via INTRAVENOUS
  Filled 2020-12-15: qty 66

## 2020-12-15 MED ORDER — DEXTROSE 5 % IV SOLN
Freq: Once | INTRAVENOUS | Status: AC
Start: 2020-12-15 — End: 2020-12-15

## 2020-12-15 MED ORDER — PALONOSETRON HCL INJECTION 0.25 MG/5ML
0.2500 mg | Freq: Once | INTRAVENOUS | Status: AC
  Administered 2020-12-15: 0.25 mg via INTRAVENOUS
  Filled 2020-12-15: qty 5

## 2020-12-15 MED ORDER — OXALIPLATIN CHEMO INJECTION 100 MG/20ML
70.0000 mg/m2 | Freq: Once | INTRAVENOUS | Status: AC
  Administered 2020-12-15: 115 mg via INTRAVENOUS
  Filled 2020-12-15: qty 20

## 2020-12-15 NOTE — Progress Notes (Signed)
Decrease oxaliplatin to 70mg /m2 per Dr Burr Medico

## 2020-12-15 NOTE — Patient Instructions (Addendum)
[facility] MEDICAL ONCOLOGY    Take potassium 3 times daily.  Take lasix 20 mg (1 tab) daily on day of chemo and while pump is infusing,  then take 10 mg (1/2 tab) daily therafter  Discharge Instructions: Thank you for choosing [facility] to provide your oncology and hematology care.   If you have a lab appointment with the [facility], please go directly to the [facility] and check in at the registration area.   Wear comfortable clothing and clothing appropriate for easy access to any Portacath or PICC line.   We strive to give you quality time with your provider. You may need to reschedule your appointment if you arrive late (15 or more minutes).  Arriving late affects you and other patients whose appointments are after yours.  Also, if you miss three or more appointments without notifying the office, you may be dismissed from the clinic at the provider's discretion.      For prescription refill requests, have your pharmacy contact our office and allow 72 hours for refills to be completed.    Today you received the following chemotherapy and/or immunotherapy agents: Oxaliplatin, Leucovorin, and Fluorouracil (Adrucil)      To help prevent nausea and vomiting after your treatment, we encourage you to take your nausea medication as directed.  BELOW ARE SYMPTOMS THAT SHOULD BE REPORTED IMMEDIATELY: *FEVER GREATER THAN 100.4 F (38 C) OR HIGHER *CHILLS OR SWEATING *NAUSEA AND VOMITING THAT IS NOT CONTROLLED WITH YOUR NAUSEA MEDICATION *UNUSUAL SHORTNESS OF BREATH *UNUSUAL BRUISING OR BLEEDING *URINARY PROBLEMS (pain or burning when urinating, or frequent urination) *BOWEL PROBLEMS (unusual diarrhea, constipation, pain near the anus) TENDERNESS IN MOUTH AND THROAT WITH OR WITHOUT PRESENCE OF ULCERS (sore throat, sores in mouth, or a toothache) UNUSUAL RASH, SWELLING OR PAIN  UNUSUAL VAGINAL DISCHARGE OR ITCHING   Items with * indicate a potential  emergency and should be followed up as soon as possible or go to the Emergency Department if any problems should occur.  Please show the CHEMOTHERAPY ALERT CARD or IMMUNOTHERAPY ALERT CARD at check-in to the Emergency Department and triage nurse.  Should you have questions after your visit or need to cancel or reschedule your appointment, please contact [facility]  Dept: [phone]  and follow the prompts.  Office hours are 8:00 a.m. to 4:30 p.m. Monday - Friday. Please note that voicemails left after 4:00 p.m. may not be returned until the following business day.  We are closed weekends and major holidays. You have access to a nurse at all times for urgent questions. Please call the main number to the clinic Dept: [phone] and follow the prompts.   For any non-urgent questions, you may also contact your provider using MyChart. We now offer e-Visits for anyone 84 and older to request care online for non-urgent symptoms. For details visit [email or web address].   Also download the MyChart app! Go to the app store, search "MyChart", open the app, select [facility], and log in with your MyChart username and password.  Due to Covid, a mask is required upon entering the hospital/clinic. If you do not have a mask, one will be given to you upon arrival. For doctor visits, patients may have 1 support person aged 32 or older with them. For treatment visits, patients cannot have anyone with them due to current Covid guidelines and our immunocompromised population.   The chemotherapy medication bag should finish at 46 hours. For example,  if your pump is scheduled for 46 hours and it was put on at 4:00 p.m., it should finish at 2:00 p.m. the day it is scheduled to come off regardless of your appointment time.     Estimated time to finish at 12/2  @ 1245   If the display on your pump reads "Low Volume" and it is beeping, take the batteries out of the pump and come to the  cancer center for it to be taken off.   If the pump alarms go off prior to the pump reading "Low Volume" then call [phone] and someone can assist you.  If the plunger comes out and the chemotherapy medication is leaking out, please use your home chemo spill kit to clean up the spill. Do NOT use paper towels or other household products.  If you have problems or questions regarding your pump, please call either [phone] (24 hours a day) or the cancer center Monday-Friday 8:00 a.m.- 4:30 p.m. at the clinic number and we will assist you. If you are unable to get assistance, then go to the nearest Emergency Department and ask the staff to contact the IV team for assistance.    Oxaliplatin Injection What is this medication? OXALIPLATIN (ox AL i PLA tin) is a chemotherapy drug. It targets fast dividing cells, like cancer cells, and causes these cells to die. This medicine is used to treat cancers of the colon and rectum, and many other cancers. This medicine may be used for other purposes; ask your health care provider or pharmacist if you have questions. COMMON BRAND NAME(S): Eloxatin What should I tell my care team before I take this medication? They need to know if you have any of these conditions: heart disease history of irregular heartbeat liver disease low blood counts, like white cells, platelets, or red blood cells lung or breathing disease, like asthma take medicines that treat or prevent blood clots tingling of the fingers or toes, or other nerve disorder an unusual or allergic reaction to oxaliplatin, other chemotherapy, other medicines, foods, dyes, or preservatives pregnant or trying to get pregnant breast-feeding How should I use this medication? This drug is given as an infusion into a vein. It is administered in a hospital or clinic by a specially trained health care professional. Talk to your pediatrician regarding the use of this medicine in children. Special care  may be needed. Overdosage: If you think you have taken too much of this medicine contact a poison control center or emergency room at once. NOTE: This medicine is only for you. Do not share this medicine with others. What if I miss a dose? It is important not to miss a dose. Call your doctor or health care professional if you are unable to keep an appointment. What may interact with this medication? Do not take this medicine with any of the following medications: cisapride dronedarone pimozide thioridazine This medicine may also interact with the following medications: aspirin and aspirin-like medicines certain medicines that treat or prevent blood clots like warfarin, apixaban, dabigatran, and rivaroxaban cisplatin cyclosporine diuretics medicines for infection like acyclovir, adefovir, amphotericin B, bacitracin, cidofovir, foscarnet, ganciclovir, gentamicin, pentamidine, vancomycin NSAIDs, medicines for pain and inflammation, like ibuprofen or naproxen other medicines that prolong the QT interval (an abnormal heart rhythm) pamidronate zoledronic acid This list may not describe all possible interactions. Give your health care provider a list of all the medicines, herbs, non-prescription drugs, or dietary supplements you use. Also tell them if you smoke, drink alcohol,  or use illegal drugs. Some items may interact with your medicine. What should I watch for while using this medication? Your condition will be monitored carefully while you are receiving this medicine. You may need blood work done while you are taking this medicine. This medicine may make you feel generally unwell. This is not uncommon as chemotherapy can affect healthy cells as well as cancer cells. Report any side effects. Continue your course of treatment even though you feel ill unless your healthcare professional tells you to stop. This medicine can make you more sensitive to cold. Do not drink cold drinks or use ice.  Cover exposed skin before coming in contact with cold temperatures or cold objects. When out in cold weather wear warm clothing and cover your mouth and nose to warm the air that goes into your lungs. Tell your doctor if you get sensitive to the cold. Do not become pregnant while taking this medicine or for 9 months after stopping it. Women should inform their health care professional if they wish to become pregnant or think they might be pregnant. Men should not father a child while taking this medicine and for 6 months after stopping it. There is potential for serious side effects to an unborn child. Talk to your health care professional for more information. Do not breast-feed a child while taking this medicine or for 3 months after stopping it. This medicine has caused ovarian failure in some women. This medicine may make it more difficult to get pregnant. Talk to your health care professional if you are concerned about your fertility. This medicine has caused decreased sperm counts in some men. This may make it more difficult to father a child. Talk to your health care professional if you are concerned about your fertility. This medicine may increase your risk of getting an infection. Call your health care professional for advice if you get a fever, chills, or sore throat, or other symptoms of a cold or flu. Do not treat yourself. Try to avoid being around people who are sick. Avoid taking medicines that contain aspirin, acetaminophen, ibuprofen, naproxen, or ketoprofen unless instructed by your health care professional. These medicines may hide a fever. Be careful brushing or flossing your teeth or using a toothpick because you may get an infection or bleed more easily. If you have any dental work done, tell your dentist you are receiving this medicine. What side effects may I notice from receiving this medication? Side effects that you should report to your doctor or health care professional as soon  as possible: allergic reactions like skin rash, itching or hives, swelling of the face, lips, or tongue breathing problems cough low blood counts - this medicine may decrease the number of white blood cells, red blood cells, and platelets. You may be at increased risk for infections and bleeding nausea, vomiting pain, redness, or irritation at site where injected pain, tingling, numbness in the hands or feet signs and symptoms of bleeding such as bloody or black, tarry stools; red or dark brown urine; spitting up blood or brown material that looks like coffee grounds; red spots on the skin; unusual bruising or bleeding from the eyes, gums, or nose signs and symptoms of a dangerous change in heartbeat or heart rhythm like chest pain; dizziness; fast, irregular heartbeat; palpitations; feeling faint or lightheaded; falls signs and symptoms of infection like fever; chills; cough; sore throat; pain or trouble passing urine signs and symptoms of liver injury like dark yellow or brown  urine; general ill feeling or flu-like symptoms; light-colored stools; loss of appetite; nausea; right upper belly pain; unusually weak or tired; yellowing of the eyes or skin signs and symptoms of low red blood cells or anemia such as unusually weak or tired; feeling faint or lightheaded; falls signs and symptoms of muscle injury like dark urine; trouble passing urine or change in the amount of urine; unusually weak or tired; muscle pain; back pain Side effects that usually do not require medical attention (report to your doctor or health care professional if they continue or are bothersome): changes in taste diarrhea gas hair loss loss of appetite mouth sores This list may not describe all possible side effects. Call your doctor for medical advice about side effects. You may report side effects to FDA at 1-800-FDA-1088. Where should I keep my medication? This drug is given in a hospital or clinic and will not be  stored at home. NOTE: This sheet is a summary. It may not cover all possible information. If you have questions about this medicine, talk to your doctor, pharmacist, or health care provider.  2022 Elsevier/Gold Standard (2020-09-21 00:00:00)

## 2020-12-15 NOTE — Progress Notes (Signed)
Decrease fluorouracil pump to 2000 mg/m2 per Dr Burr Medico.  Patient lives alone and age.  T.O. Dr Lavonda Jumbo, PharmD

## 2020-12-16 ENCOUNTER — Other Ambulatory Visit: Payer: Self-pay

## 2020-12-16 ENCOUNTER — Telehealth: Payer: Self-pay

## 2020-12-16 DIAGNOSIS — R600 Localized edema: Secondary | ICD-10-CM

## 2020-12-16 DIAGNOSIS — Z7189 Other specified counseling: Secondary | ICD-10-CM

## 2020-12-16 DIAGNOSIS — C252 Malignant neoplasm of tail of pancreas: Secondary | ICD-10-CM

## 2020-12-16 MED ORDER — FUROSEMIDE 20 MG PO TABS: ORAL_TABLET | ORAL | 1 refills | Status: DC

## 2020-12-16 MED ORDER — PROCHLORPERAZINE MALEATE 10 MG PO TABS: 10.0000 mg | ORAL_TABLET | Freq: Four times a day (QID) | ORAL | 3 refills | Status: DC | PRN

## 2020-12-16 NOTE — Progress Notes (Signed)
Sent refill for Antinausea med as well.

## 2020-12-16 NOTE — Telephone Encounter (Signed)
This nurse received message from patient stating that she needs a refill on her Lasix.  This nurse attempted to reach patient by phone no answer.  Message left for patient notifying that refill has been submitted.  No further questions or concerns at this time.

## 2020-12-17 ENCOUNTER — Inpatient Hospital Stay: Payer: Medicare Other | Attending: Nurse Practitioner

## 2020-12-17 ENCOUNTER — Telehealth: Payer: Self-pay | Admitting: Hematology

## 2020-12-17 ENCOUNTER — Other Ambulatory Visit: Payer: Self-pay

## 2020-12-17 VITALS — BP 129/80 | HR 81 | Temp 99.0°F | Resp 20

## 2020-12-17 DIAGNOSIS — C252 Malignant neoplasm of tail of pancreas: Secondary | ICD-10-CM | POA: Diagnosis not present

## 2020-12-17 DIAGNOSIS — C787 Secondary malignant neoplasm of liver and intrahepatic bile duct: Secondary | ICD-10-CM | POA: Diagnosis not present

## 2020-12-17 DIAGNOSIS — Z452 Encounter for adjustment and management of vascular access device: Secondary | ICD-10-CM | POA: Diagnosis not present

## 2020-12-17 DIAGNOSIS — Z5111 Encounter for antineoplastic chemotherapy: Secondary | ICD-10-CM | POA: Diagnosis not present

## 2020-12-17 DIAGNOSIS — Z7189 Other specified counseling: Secondary | ICD-10-CM

## 2020-12-17 MED ORDER — SODIUM CHLORIDE 0.9% FLUSH
10.0000 mL | INTRAVENOUS | Status: DC | PRN
  Administered 2020-12-17: 10 mL

## 2020-12-17 MED ORDER — HEPARIN SOD (PORK) LOCK FLUSH 100 UNIT/ML IV SOLN
500.0000 [IU] | Freq: Once | INTRAVENOUS | Status: AC | PRN
  Administered 2020-12-17: 500 [IU]

## 2020-12-17 NOTE — Telephone Encounter (Signed)
Left message with follow-up appointments per 11/30 los.

## 2020-12-20 ENCOUNTER — Other Ambulatory Visit: Payer: Self-pay

## 2020-12-20 NOTE — Progress Notes (Signed)
Pt called wanting to know if it would be OK for her to have an annual mammogram with her port in place.  Informed pt that she can still get a mammogram even though she has a port.  Pt verbalized understanding and stated she will get her mammogram scheduled.

## 2020-12-24 ENCOUNTER — Other Ambulatory Visit: Payer: Self-pay

## 2020-12-24 ENCOUNTER — Ambulatory Visit (INDEPENDENT_AMBULATORY_CARE_PROVIDER_SITE_OTHER): Payer: Medicare Other | Admitting: Internal Medicine

## 2020-12-24 ENCOUNTER — Encounter: Payer: Self-pay | Admitting: Internal Medicine

## 2020-12-24 ENCOUNTER — Other Ambulatory Visit: Payer: Self-pay | Admitting: Internal Medicine

## 2020-12-24 VITALS — BP 122/51 | HR 80 | Temp 98.4°F | Ht 60.0 in | Wt 136.2 lb

## 2020-12-24 DIAGNOSIS — J301 Allergic rhinitis due to pollen: Secondary | ICD-10-CM

## 2020-12-24 DIAGNOSIS — F325 Major depressive disorder, single episode, in full remission: Secondary | ICD-10-CM

## 2020-12-24 DIAGNOSIS — T451X5A Adverse effect of antineoplastic and immunosuppressive drugs, initial encounter: Secondary | ICD-10-CM

## 2020-12-24 DIAGNOSIS — R600 Localized edema: Secondary | ICD-10-CM | POA: Diagnosis not present

## 2020-12-24 DIAGNOSIS — E1139 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: Secondary | ICD-10-CM

## 2020-12-24 DIAGNOSIS — G620 Drug-induced polyneuropathy: Secondary | ICD-10-CM

## 2020-12-24 DIAGNOSIS — I10 Essential (primary) hypertension: Secondary | ICD-10-CM | POA: Diagnosis not present

## 2020-12-24 DIAGNOSIS — K219 Gastro-esophageal reflux disease without esophagitis: Secondary | ICD-10-CM

## 2020-12-24 MED ORDER — ONETOUCH VERIO REFLECT W/DEVICE KIT: PACK | 0 refills | Status: DC

## 2020-12-24 NOTE — Assessment & Plan Note (Addendum)
This problem is chronic and she reports it is bothering her today. She takes gabapentin for it, but does not know the dose she takes, and feels like it is not helpful for her. She will call our office to relay the dose of her gabapentin and we can increase it if needed or switch to another medication like Lyrica.

## 2020-12-24 NOTE — Assessment & Plan Note (Addendum)
BP today 122/51, within goal range. Her most recent lab values showed decreased K+ to 3.3, and she takes KCl supplementation. We discussed her current blood pressure medications and she will stop taking HCTZ as her blood pressures have been stable within goal range and HCTZ can diminish serum K+ levels. She is also taking furosemide which increases risk of hypokalemia. We will continue benazepril. - STOP HCTZ - Continue Benazepril 20mg 

## 2020-12-24 NOTE — Assessment & Plan Note (Signed)
Symmetric 2+ pitting edema to the knees bilaterally that has been present for some time. She has a history of clot in her port and hypercoagulable state in the setting of malignancy. Exam shows no erythema or warmth-to-touch that would increase suspicion for DVTs, and she reports improvement in symptoms after initiating Lasix. She takes 20mg  Lasix once daily and we discussed option to increase dose to 40mg  daily or 20mg  BID, but her preference is to try compression stockings first. - Continue 20mg  Lasix daily - Compression stockings

## 2020-12-24 NOTE — Patient Instructions (Signed)
Thank you, [name] for allowing [place] to provide your care today. Today we discussed:  Leg Swelling: - Continue taking Xarelto - Wear compression stockings for leg swelling  Medication Changes: - Stop taking glipizide and HCTZ - Please call the clinic with your exact dose of gabapentin. We will discuss Lyrica as an alternative at this time.  Diabetes: We ordered you a new glucose monitoring kit at today's visit.  I have ordered the following medication/changed the following medications:   Stop the following medications: Medications Discontinued During This Encounter  Medication Reason   gabapentin (NEURONTIN) 100 MG capsule    calcium carbonate (OS-CAL) 600 MG TABS tablet    glipiZIDE (GLUCOTROL XL) 2.5 MG 24 hr tablet    hydrochlorothiazide (MICROZIDE) 12.5 MG capsule        Follow up:  January 20th, 2023    Remember:  Should you have any questions or concerns please call the internal medicine clinic at [phone].

## 2020-12-24 NOTE — Progress Notes (Signed)
  Subjective:     [name], female   DOB: 08/24/1950, 70 y.o.   MRN: [id number]  Mariah Henderson is a 70 y/o with history of pancreatic cancer, hypertension, and T2DM who presents to clinic for routine follow-up. 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 Additionally, she reports hand pain, for which she takes gabapentin.    Review of Systems  Cardiovascular:  Positive for leg swelling.  Gastrointestinal:  Negative for abdominal pain, constipation and diarrhea.      Objective:   Physical Exam Constitutional:      Appearance: Normal appearance.  HENT:     Head: Normocephalic and atraumatic.  Cardiovascular:     Rate and Rhythm: Normal rate and regular rhythm.     Heart sounds: Normal heart sounds.  Pulmonary:     Effort: Pulmonary effort is normal. No respiratory distress.     Breath sounds: Normal breath sounds. No wheezing.  Abdominal:     General: Bowel sounds are normal.  Musculoskeletal:     Right lower leg: Edema present.     Left lower leg: Edema present.     Comments: Symmetrical 2+ pitting edema to the knees bilaterally. Not erythematous or warm to touch.  Skin:    General: Skin is warm and dry.  Neurological:     Mental Status: She is alert.       Assessment & Plan:     See problem-based charting.

## 2020-12-24 NOTE — Assessment & Plan Note (Signed)
Her most recent A1c was 6.1 on 10/24 which is within goal of <8. Her weight loss likely contributes to this. After shared decision-making discussion, decision was made to stop taking glipizide due to good diabetes control and risk of hypoglycemia on this medication. She requested a new glucose monitor and this was ordered today. - Stop glipizide.

## 2020-12-24 NOTE — Assessment & Plan Note (Signed)
She reports no concerns with her allergies today and takes her medications regularly.

## 2020-12-25 ENCOUNTER — Encounter: Payer: Medicare Other | Admitting: Genetic Counselor

## 2020-12-27 ENCOUNTER — Telehealth: Payer: Self-pay

## 2020-12-27 NOTE — Telephone Encounter (Signed)
Return pt's call. Stated she her finger on the back door. She put a bandage on; stated it was bleeding but not now. Advised to try ice to prevent swelling. She wanted to know what to put on it , I told her she could get neosporin from the pharmacy. And I will ask Dr Daryll Drown for any other suggestions.

## 2020-12-27 NOTE — Telephone Encounter (Signed)
Pt states she closed the door on her finger and wanted to know what should she do. Please call pt back.

## 2020-12-28 ENCOUNTER — Ambulatory Visit (INDEPENDENT_AMBULATORY_CARE_PROVIDER_SITE_OTHER): Payer: Medicare Other | Admitting: Internal Medicine

## 2020-12-28 DIAGNOSIS — S61216A Laceration without foreign body of right little finger without damage to nail, initial encounter: Secondary | ICD-10-CM

## 2020-12-28 DIAGNOSIS — S61219A Laceration without foreign body of unspecified finger without damage to nail, initial encounter: Secondary | ICD-10-CM | POA: Insufficient documentation

## 2020-12-28 MED FILL — Dexamethasone Sodium Phosphate Inj 100 MG/10ML: INTRAMUSCULAR | Qty: 1 | Status: AC

## 2020-12-28 NOTE — Telephone Encounter (Signed)
Called pt -stated her finger is swollen, she soaked it in epsom salt. And when she takes off the band-aid, it bleeds "a little". It is a open wound. Pt agreed to come in today to have one of the doctors to look at her finger. Call transferred to front office - appt schedule w/Dr Court Joy @ 1545 PM.

## 2020-12-28 NOTE — Assessment & Plan Note (Signed)
Well controlled today on famotidine.  Will continue to monitor.

## 2020-12-28 NOTE — Progress Notes (Signed)
°  Subjective:     [name], female   DOB: 03/23/1950, 70 y.o.   MRN: [id number]  Mariah Henderson is a 70 y/o with PMHx significant for pancreatic cancer and chemotherapy-induced neuropathy who is seen in clinic today after hurting her right pinkie finger yesterday. She says she caught her finger in the car door when doing some shopping, but did not feel it because of her neuropathy. When she was walking around the store doing her shopping, some of the workers told her she was bleeding a lot and got her a band-aid. Her finger was painful yesterday, and this morning she noticed it was still bleeding, so she called the clinic to ask for advice. She has not taken any medication for the injury or pain. She takes Xarelto for anticoagulation    Review of Systems  Neurological:  Positive for light-headedness. Negative for dizziness.       Around the time of the injury.      Objective:   Physical Exam Musculoskeletal:       Hands:     Comments: Small shallow laceration inferior to the nailbed on the dorsal surface of the fifth finger of the right hand. Not actively bleeding on exam. No evident nailbed involvement.       Assessment & Plan:     See problem-based charting.

## 2020-12-28 NOTE — Assessment & Plan Note (Addendum)
She caught her right little finger in the car door while shopping yesterday and did not initially notice the injury until she was alerted to her finger bleeding by some employees at the shop who subsequently gave her a bandage. This morning she noticed the bleeding had not subsided and called the clinic out of concern. She is on Xarelto for anticoagulation.  On presentation it was initially difficult to visualize the injury due to the presence of coagulated blood obscuring the extent of injury, but after cleaning her finger, the injury appeared quite uncomplicated without nail involvement and was no longer bleeding. Based on the mild severity of the injury, she will not benefit from oral antibiotics at this time. However, instructed her to keep the injury clean and dry to avoid potential infection and to call the clinic if she notices any signs or symptoms of infection, including swelling, throbbing, purulent discharge, or redness.  Plan: - Encouraged supportive care including dressing changes - Patient instructed to call clinic if injury does not heal or if she starts to exhibit signs or symptoms of infection.

## 2020-12-28 NOTE — Assessment & Plan Note (Signed)
Her mood is currently very good and positive.  We initiated a goals of care discussion today.  [name] feels like she is doing well with chemotherapy, able to continue doing all of her ADLs and spend quality time with her family.  She prefers to continue treatment at this time and she is very hopeful for continued good results.  I will see her back in a month.

## 2020-12-28 NOTE — Progress Notes (Signed)
Attestation for Student Documentation:  I personally was present and performed or re-performed the history, physical exam and medical decision-making activities of this service and have verified that the service and findings are accurately documented in the student's note.  Sid Falcon, MD 12/28/2020, 6:30 AM

## 2020-12-28 NOTE — Patient Instructions (Signed)
Thank you, Mariah Henderson for allowing [place] to provide your care today. Today we discussed:  Your cut: - Keep the wound clean and dry. It may continue to ooze but should heal on its own. If it is not healing well or you think it may be infected, call our office to come see [place].  Follow up:  As needed    Remember: Call us if you notice any signs of infection, including redness, swelling, throbbing, warmth, or pus.  Should you have any questions or concerns please call the internal medicine clinic at [phone].

## 2020-12-29 ENCOUNTER — Inpatient Hospital Stay: Payer: Medicare Other

## 2020-12-29 ENCOUNTER — Encounter: Payer: Self-pay | Admitting: Hematology

## 2020-12-29 ENCOUNTER — Inpatient Hospital Stay (HOSPITAL_BASED_OUTPATIENT_CLINIC_OR_DEPARTMENT_OTHER): Payer: Medicare Other | Admitting: Hematology

## 2020-12-29 ENCOUNTER — Other Ambulatory Visit: Payer: Self-pay

## 2020-12-29 VITALS — BP 124/67 | HR 93 | Temp 98.9°F | Resp 16 | Wt 138.0 lb

## 2020-12-29 DIAGNOSIS — Z95828 Presence of other vascular implants and grafts: Secondary | ICD-10-CM

## 2020-12-29 DIAGNOSIS — C252 Malignant neoplasm of tail of pancreas: Secondary | ICD-10-CM | POA: Diagnosis not present

## 2020-12-29 DIAGNOSIS — Z7189 Other specified counseling: Secondary | ICD-10-CM

## 2020-12-29 DIAGNOSIS — Z5111 Encounter for antineoplastic chemotherapy: Secondary | ICD-10-CM | POA: Diagnosis not present

## 2020-12-29 LAB — CBC WITH DIFFERENTIAL (CANCER CENTER ONLY)
Abs Immature Granulocytes: 0.01 10*3/uL (ref 0.00–0.07)
Basophils Absolute: 0.1 10*3/uL (ref 0.0–0.1)
Basophils Relative: 1 %
Eosinophils Absolute: 0.3 10*3/uL (ref 0.0–0.5)
Eosinophils Relative: 7 %
HCT: 27.2 % — ABNORMAL LOW (ref 36.0–46.0)
Hemoglobin: 8.5 g/dL — ABNORMAL LOW (ref 12.0–15.0)
Immature Granulocytes: 0 %
Lymphocytes Relative: 9 %
Lymphs Abs: 0.4 10*3/uL — ABNORMAL LOW (ref 0.7–4.0)
MCH: 29.1 pg (ref 26.0–34.0)
MCHC: 31.3 g/dL (ref 30.0–36.0)
MCV: 93.2 fL (ref 80.0–100.0)
Monocytes Absolute: 0.8 10*3/uL (ref 0.1–1.0)
Monocytes Relative: 16 %
Neutro Abs: 3.2 10*3/uL (ref 1.7–7.7)
Neutrophils Relative %: 67 %
Platelet Count: 170 10*3/uL (ref 150–400)
RBC: 2.92 MIL/uL — ABNORMAL LOW (ref 3.87–5.11)
RDW: 16.2 % — ABNORMAL HIGH (ref 11.5–15.5)
WBC Count: 4.8 10*3/uL (ref 4.0–10.5)
nRBC: 0 % (ref 0.0–0.2)

## 2020-12-29 LAB — CMP (CANCER CENTER ONLY)
ALT: 24 U/L (ref 0–44)
AST: 36 U/L (ref 15–41)
Albumin: 3 g/dL — ABNORMAL LOW (ref 3.5–5.0)
Alkaline Phosphatase: 124 U/L (ref 38–126)
Anion gap: 8 (ref 5–15)
BUN: 13 mg/dL (ref 8–23)
CO2: 25 mmol/L (ref 22–32)
Calcium: 8.6 mg/dL — ABNORMAL LOW (ref 8.9–10.3)
Chloride: 107 mmol/L (ref 98–111)
Creatinine: 0.76 mg/dL (ref 0.44–1.00)
GFR, Estimated: >60 mL/min (ref >60)
Glucose, Bld: 211 mg/dL — ABNORMAL HIGH (ref 70–99)
Potassium: 4.4 mmol/L (ref 3.5–5.1)
Sodium: 140 mmol/L (ref 135–145)
Total Bilirubin: 0.4 mg/dL (ref 0.3–1.2)
Total Protein: 6.2 g/dL — ABNORMAL LOW (ref 6.5–8.1)

## 2020-12-29 MED ORDER — LEUCOVORIN CALCIUM INJECTION 350 MG
400.0000 mg/m2 | Freq: Once | INTRAVENOUS | Status: AC
  Administered 2020-12-29: 12:00:00 660 mg via INTRAVENOUS
  Filled 2020-12-29: qty 25

## 2020-12-29 MED ORDER — OXALIPLATIN CHEMO INJECTION 100 MG/20ML
70.0000 mg/m2 | Freq: Once | INTRAVENOUS | Status: AC
  Administered 2020-12-29: 12:00:00 115 mg via INTRAVENOUS
  Filled 2020-12-29: qty 20

## 2020-12-29 MED ORDER — PALONOSETRON HCL INJECTION 0.25 MG/5ML
0.2500 mg | Freq: Once | INTRAVENOUS | Status: AC
  Administered 2020-12-29: 11:00:00 0.25 mg via INTRAVENOUS
  Filled 2020-12-29: qty 5

## 2020-12-29 MED ORDER — SODIUM CHLORIDE 0.9 % IV SOLN
10.0000 mg | Freq: Once | INTRAVENOUS | Status: AC
  Administered 2020-12-29: 11:00:00 10 mg via INTRAVENOUS
  Filled 2020-12-29: qty 10

## 2020-12-29 MED ORDER — SODIUM CHLORIDE 0.9% FLUSH
10.0000 mL | Freq: Once | INTRAVENOUS | Status: AC
  Administered 2020-12-29: 10:00:00 10 mL

## 2020-12-29 MED ORDER — DEXTROSE 5 % IV SOLN
Freq: Once | INTRAVENOUS | Status: AC
Start: 2020-12-29 — End: 2020-12-29

## 2020-12-29 MED ORDER — SODIUM CHLORIDE 0.9 % IV SOLN
2000.0000 mg/m2 | INTRAVENOUS | Status: DC
  Administered 2020-12-29: 14:00:00 3300 mg via INTRAVENOUS
  Filled 2020-12-29: qty 66

## 2020-12-29 NOTE — Progress Notes (Signed)
[place]   [phone] [phone]   Clinic Follow up Note   Patient Care Team: Sid Falcon, MD as PCP - General (Internal Medicine) Forrest Moron, [place] (Optometry) Jonnie Finner, RN (Inactive) as Oncology Nurse Navigator Truitt Merle, MD as Consulting Physician (Oncology) Gwyndolyn Kaufman, RN (Inactive) as Registered Nurse  Date of Service:  12/29/2020  CHIEF COMPLAINT: f/u of pancreatic cancer  CURRENT THERAPY:  Third line FOLFOX q2 weeks, dose reduced oxali and 5FU, started 12/15/20  ASSESSMENT & PLAN:  Mariah Henderson is a 70 y.o. female with   1. Pancreatic cancer with Liver metastasis, stage IV  -Her CT AP from 01/13/20 showed 2cm mass on tail of pancreas abutting but not invading the gastric wall, with likely liver and omental metastasis.  -Her liver biopsy from 01/28/20 showed adenocarcinoma from pancreatic primary. Her 02/11/20 CT Chest showed no metastasis  -She began first-line palliative chemo with gemcitabine and Abraxane every 2 weeks beginning 02/26/20, she progressed in the liver  - changed to second line liposomal irinotecan/leuc and 5FU beginning 06/02/20. Dose reduced for cycle 1. Unfortunately CT CAP 11/25/20 shows pulmonary progression -she began third line FOLFOX on 12/15/20, with dose reduced oxaliplatin for neuropathy. She tolerated well overall. Neuropathy is stable  -labs reviewed, stable and adequate to proceed with treatment today.   2. Abdominal Pain, low appetite, weight loss, Transmanitis -She had abdominal pain for the month prior to cancer diagnosis. She is on Norco 5-379m BID as needed. her pain has improved since chemo  -She had sudden loss of appetite and weight loss since October 2021. -On Chemo her appetite has increased, gaining weight and eating more. -denies pain   3. Comorbidities: Asthma, Borderline Diabetic, GERD, Heart murmur, HLD, HTN, arthritis, neuropathy  -On medications, including  multiple diuretics. -Managed by PCP.   -compression stockings for leg edema  -she had pre-existing neuropathy prior to treatment, worsened on chemo -referred to Dr. VMickeal Skinner improved on higher dose gabapentin    -tuning fork exam 12/15/20 shows moderately decreased vibratory sense over the fingertips L>R -We will monitor closely on oxaliplatin, I recommend to dose reduce   4. Social support -She lives alone in 1 level house and she is retired. She has 1 adult son who lives in [place] She has a twin and 3 other sisters who [place] and other siblings and relative in [place]  -Her son will help her at home  -She has approved for grant to help with medication. Her sister will notify [place] type of grant.    5. Cytopenias -secondary to chemotherapy   -stable.    6. Goal of care discussion -The patient understands the goal of care is palliative. -currently full code    7. Hypokalemia secondary to diuretic -She is on Lasix and dieretics for her LE edema and heart.  -Doppler 05/19/20 negative for DVT -LE edema fluctuates, increased now. Restart lasix 20 mg po once daily on days 1-3 of chemo, then 10 mg daily    8. Genetic testing from 02/19/20 was negative for pathogenetic mutation with VUS of gene NF1  9.  Peripheral neuropathy secondary to chemotherapy -Moderate, she is able to function well -Continue gabapentin -f/u with Dr. VMickeal Skinner  PLAN: -proceed with C2 FOLFOX at same reduced dose. -lab, flush, f/u, and FOLFOX in 2 weeks   No problem-specific Assessment & Plan notes found for this encounter.   SUMMARY OF ONCOLOGIC HISTORY: Oncology History Overview Note  Cancer Staging Pancreatic cancer [facility]) Staging form: Exocrine Pancreas, AJCC 8th Edition - Clinical: Stage IV (cT1c, cN0, pM1) - Signed by Truitt Merle, MD on 02/26/2020 Total positive nodes: 0    Pancreatic cancer ([place])  01/13/2020 Imaging   US Abdomen 01/13/20  IMPRESSION: 1. Suggestion of 3 vascular  hypoechoic mass within the liver measuring up to 2.5 cm. 2. Suggestion of a hypoechoic mass within the tail of the pancreas that is not well visualized. 3. Findings concerning for malignancy, please see separately dictated CT abdomen pelvis 01/13/2020.   01/13/2020 Imaging   CT AP 01/13/20  IMPRESSION: 1. Hypoenhancing 2.0 cm in long axis mass in the pancreatic tail compatible with pancreatic adenocarcinoma. The mass abuts the posterior gastric wall but without definite gastric invasion. The splenic vein appears narrowed/attenuated compared to prior exams and the lesion abuts the margin of the splenic artery. 2. Hypoenhancing hepatic masses are new compared to 11/22/2017 and are highly suspicious for metastatic lesions. 3. Questionable 0.8 by 0.8 cm omental tumor nodule versus dense diverticulum above the transverse colon. 4. Other imaging findings of potential clinical significance: Mild distal esophageal wall thickening, query esophagitis. Stable left adrenal adenoma. Sigmoid colon diverticulosis. Lumbar and thoracic spondylosis and degenerative disc disease contributing to multilevel impingement. 5. Aortic atherosclerosis. Aortic Atherosclerosis (ICD10-I70.0).   01/21/2020 Initial Diagnosis   Pancreatic cancer ([place])   01/28/2020 Initial Biopsy   FINAL MICROSCOPIC DIAGNOSIS:   A. LIVER, NEEDLE CORE BIOPSY:  - Adenocarcinoma.   COMMENT:   Immunohistochemistry for CK7 is positive.  CDX-2 demonstrates weak to  moderate positive staining.  TTF-1 and PAX 8 demonstrate weak, likely  nonspecific staining.  CK20, GATA-3 and ER are negative.  The  morphologic and immunophenotypic characteristics are compatible with the  clinical impression of a primary pancreatic cancer.  Radiologic  correlation is encouraged.  If applicable, there is likely sufficient  tissue for ancillary studies (Block A2).  Dr. Vic Ripper reviewed the  case.    02/26/2020 - 05/19/2020 Chemotherapy   First-line  Gemcitabine and Abraxane every 2 weeks starting 02/26/20. D/c after 05/19/20 due to disease progression in liver and neuropathy     02/26/2020 Cancer Staging   Staging form: Exocrine Pancreas, AJCC 8th Edition - Clinical: Stage IV (cT1c, cN0, pM1) - Signed by Truitt Merle, MD on 02/26/2020 Total positive nodes: 0    03/08/2020 Genetic Testing   Negative hereditary cancer genetic testing: no pathogenic variants detected in [facility] Common Hereditary Cancers Panel with preliminary evidence pancreatic cancer genes.  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 The report date is March 08, 2020.    The Common Hereditary Cancers Panel with preliminary evidence pancreatic cancer genes offered by [facility] includes sequencing and/or deletion duplication testing of the following 49 genes: APC, ATM, AXIN2, BARD1, BMPR1A, BRCA1, BRCA2, BRIP1, CDH1, CDK4, CDKN2A (p14ARF), CDKN2A (p16INK4a), CHEK2, CTNNA1, DICER1, EPCAM (Deletion/duplication testing only), GREM1 (promoter region deletion/duplication testing only), FANCC, GREM1, HOXB13, KIT, MEN1, MLH1, MSH2, MSH3, MSH6, MUTYH, NBN, NF1, NHTL1, PALB2, PALLD, PDGFRA, PMS2, POLD1, POLE, PTEN, RAD50, RAD51C, RAD51D, SDHA, SDHB, SDHC, SDHD, SMAD4, SMARCA4. STK11, TP53, TSC1, TSC2, and VHL.  The following genes were evaluated for sequence changes only: SDHA and HOXB13 c.251G>A variant only.es only: SDHA and HOXB13 c.251G>A variant only.   05/03/2020 Imaging   CT AP  IMPRESSION: 1. Interval decrease in size of a hypodense mass of the central pancreatic tail, consistent with treatment response of primary pancreatic malignancy. 2. Numerous low-attenuation liver lesions are seen,  increased in size and number compared to prior examination. Findings are consistent with worsened hepatic metastatic disease. 3. A previously queried omental nodule adjacent to the transverse colon is more clearly a prominent diverticulum on current examination.    Aortic Atherosclerosis (ICD10-I70.0).   06/02/2020 -  Chemotherapy   Second-line FOLFIRI q2weeks starting 06/02/20    12/15/2020 -  Chemotherapy   Patient is on Treatment Plan : PANCREAS FOLFOX q14d        INTERVAL HISTORY:  Mariah Henderson is here for a follow up of pancreatic cancer. She was last seen by NP Lacie on 12/15/20. She was seen in the infusion area. She reports her neuropathy symptoms are still present. She notes she drops things. She notes she got her finger caught in the car door but did not notice initially because she did not feel it. She feels her neuropathy is stable, not worsened.   All other systems were reviewed with the patient and are negative.  MEDICAL HISTORY:  Past Medical History:  Diagnosis Date   Acute medial meniscus tear    right knee   Anxiety    Asthma    Cancer ([place])    Cervical spondylosis    Chronic serous otitis media    Constipation    COPD (chronic obstructive pulmonary disease) (HCC)    Depression    followed by [name]; no longer of depakote, seroquel   Diabetes mellitus without complication ([place])    Dysuria 12/27/2009   Qualifier: Diagnosis of  By: [name] MD, Amanjot     Foot drop    GERD (gastroesophageal reflux disease)    Heart murmur    Hyperlipidemia    Hypertension    Low back pain    Obesity    Paraumbilical hernia    Pre-diabetes     SURGICAL HISTORY: Past Surgical History:  Procedure Laterality Date   ABDOMINAL HYSTERECTOMY     ANTERIOR CERVICAL DECOMP/DISCECTOMY FUSION N/A 10/05/2014   Procedure: ACDF - C5-C6 - C6-C7;  Surgeon: Karie Chimera, MD;  Location: [facility];  Service: Neurosurgery;  Laterality: N/A;  ACDF - C5-C6 - C6-C7   CHOLECYSTECTOMY     COLONOSCOPY     FOOT SURGERY     HERNIA REPAIR  1999   IR IMAGING GUIDED PORT INSERTION  02/25/2020   IR US GUIDANCE  01/28/2020   KNEE ARTHROSCOPY WITH MEDIAL MENISECTOMY Right 06/14/2016   Procedure: RIGHT KNEE ARTHROSCOPY WITH PARTIAL MEDIAL  MENISCECTOMY, SUBCHONDROPLASTY;  Surgeon: Leandrew Koyanagi, MD;  Location: [facility];  Service: Orthopedics;  Laterality: Right;   KNEE ARTHROSCOPY WITH SUBCHONDROPLASTY Right 06/14/2016   Procedure: KNEE ARTHROSCOPY WITH SUBCHONDROPLASTY;  Surgeon: Leandrew Koyanagi, MD;  Location: [facility];  Service: Orthopedics;  Laterality: Right;   RADIOACTIVE SEED GUIDED EXCISIONAL BREAST BIOPSY Left 06/23/2014   Procedure: RADIOACTIVE SEED GUIDED EXCISIONAL BREAST BIOPSY;  Surgeon: Stark Klein, MD;  Location: [facility];  Service: General;  Laterality: Left;    I have reviewed the social history and family history with the patient and they are unchanged from previous note.  ALLERGIES:  has No Known Allergies.  MEDICATIONS:  Current Outpatient Medications  Medication Sig Dispense Refill   alendronate (FOSAMAX) 70 MG tablet TAKE 1 TABLET BY MOUTH  EVERY 7 DAYS WITH A FULL  GLASS OF WATER ON AN EMPTY  STOMACH 12 tablet 3   Accu-Chek Softclix Lancets lancets Check blood sugar once a day as instructed 102 each 3  albuterol (PROVENTIL) (2.5 MG/3ML) 0.083% nebulizer solution Take 3 mLs (2.5 mg total) by nebulization every 6 (six) hours as needed for wheezing. 1080 mL 3   albuterol (VENTOLIN HFA) 108 (90 Base) MCG/ACT inhaler INHALE 1 TO 2 PUFFS INTO THE LUNGS EVERY 6 HOURS AS NEEDED FOR WHEEZING OR SHORTNESS OF BREATH 54 g 1   atorvastatin (LIPITOR) 40 MG tablet TAKE 1 TABLET BY MOUTH EVERY DAY 90 tablet 3   benazepril (LOTENSIN) 20 MG tablet TAKE 1 TABLET(20 MG) BY MOUTH DAILY 90 tablet 3   Blood Glucose Monitoring Suppl (ONETOUCH VERIO REFLECT) w/Device KIT Check blood sugar once a day as instructed 1 kit 0   celecoxib (CELEBREX) 100 MG capsule TAKE 1 CAPSULE BY MOUTH  TWICE DAILY 180 capsule 3   cholecalciferol (VITAMIN D) 1000 units tablet Take 2,000 Units by mouth daily.     diclofenac Sodium (VOLTAREN) 1 % GEL Apply 2 g topically 4 (four) times daily. 100 g 2    famotidine (PEPCID) 40 MG tablet TAKE 1/2 TABLET(20 MG) BY MOUTH TWICE DAILY 30 tablet 3   fluticasone (FLONASE) 50 MCG/ACT nasal spray Place 1 spray into both nostrils daily. 16 g 3   Fluticasone-Salmeterol (ADVAIR DISKUS) 100-50 MCG/DOSE AEPB Inhale 1 puff into the lungs 2 (two) times daily. 60 each 11   furosemide (LASIX) 20 MG tablet TAKE 1 TABLET BY MOUTH EVERY DAY ON DAY OF CHEMO AND WHILE 5FU INFUSING THEN TAKE 1/2 TABLET DAILY 30 tablet 1   gabapentin (NEURONTIN) 300 MG capsule Take 1 capsule (300 mg total) by mouth 2 (two) times daily. 60 capsule 3   gabapentin (NEURONTIN) 800 MG tablet Take 800 mg by mouth daily.     glucose blood (ONETOUCH VERIO) test strip Check blood sugar once a day as instructed 100 each 3   HYDROcodone-acetaminophen (NORCO/VICODIN) 5-325 MG tablet Take 1 tablet by mouth 2 (two) times daily as needed for severe pain. 60 tablet 0   ipratropium (ATROVENT) 0.02 % nebulizer solution Take 0.5 mg by nebulization every 6 (six) hours as needed for wheezing or shortness of breath.     latanoprost (XALATAN) 0.005 % ophthalmic solution PLACE 1 DROP INTO BOTH EYES AT BEDTIME  0   loperamide (LOPERAMIDE A-D) 2 MG tablet Take 1 tablet (2 mg total) by mouth 2 (two) times daily as needed for diarrhea or loose stools. 6 tablet 0   loratadine (CLARITIN) 10 MG tablet Take 1 tablet (10 mg total) by mouth daily. 30 tablet 11   magic mouthwash w/lidocaine SOLN Take 5 mLs by mouth 4 (four) times daily as needed for mouth pain. 480 mL 2   meclizine (ANTIVERT) 12.5 MG tablet TAKE 1 TABLET(12.5 MG) BY MOUTH TWICE DAILY AS NEEDED FOR DIZZINESS 15 tablet 3   potassium chloride SA (KLOR-CON) 20 MEQ tablet TAKE 1 TABLET BY MOUTH THREE TIMES DAILY 270 tablet 0   prochlorperazine (COMPAZINE) 10 MG tablet Take 1 tablet (10 mg total) by mouth every 6 (six) hours as needed for nausea or vomiting. 30 tablet 3   risperiDONE (RISPERDAL) 0.25 MG tablet Take 0.25 mg by mouth daily as needed.     rivaroxaban  (XARELTO) 20 MG TABS tablet Take 1 tablet (20 mg total) by mouth daily with supper. 30 tablet 3   sertraline (ZOLOFT) 50 MG tablet Take 50 mg by mouth daily.     traZODone (DESYREL) 50 MG tablet Take 25-50 mg by mouth at bedtime as needed for sleep.  triamcinolone cream (KENALOG) 0.1 % apply to affected area twice a day 454 g 1   No current facility-administered medications for this visit.    PHYSICAL EXAMINATION: ECOG PERFORMANCE STATUS: 2 - Symptomatic, <50% confined to bed  There were no vitals filed for this visit. Wt Readings from Last 3 Encounters:  12/29/20 138 lb (62.6 kg)  12/28/20 136 lb 4.8 oz (61.8 kg)  12/24/20 136 lb 3.2 oz (61.8 kg)     GENERAL:alert, no distress and comfortable SKIN: skin color normal, no rashes or significant lesions EYES: normal, Conjunctiva are pink and non-injected, sclera clear  NEURO: alert & oriented x 3 with fluent speech  LABORATORY DATA:  I have reviewed the data as listed CBC Latest Ref Rng & Units 12/29/2020 12/15/2020 11/30/2020  WBC 4.0 - 10.5 K/uL 4.8 4.3 3.7(L)  Hemoglobin 12.0 - 15.0 g/dL 8.5(L) 8.5(L) 8.8(L)  Hematocrit 36.0 - 46.0 % 27.2(L) 27.2(L) 27.5(L)  Platelets 150 - 400 K/uL 170 155 128(L)     CMP Latest Ref Rng & Units 12/29/2020 12/15/2020 11/30/2020  Glucose 70 - 99 mg/dL 211(H) 208(H) 225(H)  BUN 8 - 23 mg/dL '13 15 13  ' Creatinine 0.44 - 1.00 mg/dL 0.76 0.68 0.76  Sodium 135 - 145 mmol/L 140 135 139  Potassium 3.5 - 5.1 mmol/L 4.4 3.3(L) 3.8  Chloride 98 - 111 mmol/L 107 104 104  CO2 22 - 32 mmol/L '25 28 28  ' Calcium 8.9 - 10.3 mg/dL 8.6(L) 8.6(L) 8.8(L)  Total Protein 6.5 - 8.1 g/dL 6.2(L) 6.0(L) 5.7(L)  Total Bilirubin 0.3 - 1.2 mg/dL 0.4 0.6 0.4  Alkaline Phos 38 - 126 U/L 124 114 108  AST 15 - 41 U/L 36 32 31  ALT 0 - 44 U/L '24 26 27      ' RADIOGRAPHIC STUDIES: I have personally reviewed the radiological images as listed and agreed with the findings in the report. No results found.    No orders  of the defined types were placed in this encounter.  All questions were answered. The patient knows to call the clinic with any problems, questions or concerns. No barriers to learning was detected. The total time spent in the appointment was 30 minutes.     Truitt Merle, MD 12/29/2020   I, Wilburn Mylar, am acting as scribe for Truitt Merle, MD.   I have reviewed the above documentation for accuracy and completeness, and I agree with the above.

## 2020-12-29 NOTE — Progress Notes (Signed)
Ok to continue treatment with peripheral neuropathy symptoms per Dr Burr Medico.

## 2020-12-29 NOTE — Patient Instructions (Signed)
[facility]   Discharge Instructions: Thank you for choosing [facility] to provide your oncology and hematology care.   If you have a lab appointment with the [facility], please go directly to the [facility] and check in at the registration area.   Wear comfortable clothing and clothing appropriate for easy access to any Portacath or PICC line.   We strive to give you quality time with your provider. You may need to reschedule your appointment if you arrive late (15 or more minutes).  Arriving late affects you and other patients whose appointments are after yours.  Also, if you miss three or more appointments without notifying the office, you may be dismissed from the clinic at the providers discretion.      For prescription refill requests, have your pharmacy contact our office and allow 72 hours for refills to be completed.    Today you received the following chemotherapy and/or immunotherapy agents: oxaliplatin, leucovorin, fluorouracil.      To help prevent nausea and vomiting after your treatment, we encourage you to take your nausea medication as directed.  BELOW ARE SYMPTOMS THAT SHOULD BE REPORTED IMMEDIATELY: *FEVER GREATER THAN 100.4 F (38 C) OR HIGHER *CHILLS OR SWEATING *NAUSEA AND VOMITING THAT IS NOT CONTROLLED WITH YOUR NAUSEA MEDICATION *UNUSUAL SHORTNESS OF BREATH *UNUSUAL BRUISING OR BLEEDING *URINARY PROBLEMS (pain or burning when urinating, or frequent urination) *BOWEL PROBLEMS (unusual diarrhea, constipation, pain near the anus) TENDERNESS IN MOUTH AND THROAT WITH OR WITHOUT PRESENCE OF ULCERS (sore throat, sores in mouth, or a toothache) UNUSUAL RASH, SWELLING OR PAIN  UNUSUAL VAGINAL DISCHARGE OR ITCHING   Items with * indicate a potential emergency and should be followed up as soon as possible or go to the Emergency Department if any problems should occur.  Please show the CHEMOTHERAPY ALERT CARD or  IMMUNOTHERAPY ALERT CARD at check-in to the Emergency Department and triage nurse.  Should you have questions after your visit or need to cancel or reschedule your appointment, please contact [facility]  Dept: [phone]  and follow the prompts.  Office hours are 8:00 a.m. to 4:30 p.m. Monday - Friday. Please note that voicemails left after 4:00 p.m. may not be returned until the following business day.  We are closed weekends and major holidays. You have access to a nurse at all times for urgent questions. Please call the main number to the clinic Dept: [phone] and follow the prompts.   For any non-urgent questions, you may also contact your provider using MyChart. We now offer e-Visits for anyone 15 and older to request care online for non-urgent symptoms. For details visit [email or web address].   Also download the MyChart app! Go to the app store, search "MyChart", open the app, select [facility], and log in with your MyChart username and password.  Due to Covid, a mask is required upon entering the hospital/clinic. If you do not have a mask, one will be given to you upon arrival. For doctor visits, patients may have 1 support person aged 49 or older with them. For treatment visits, patients cannot have anyone with them due to current Covid guidelines and our immunocompromised population.

## 2020-12-30 ENCOUNTER — Other Ambulatory Visit: Payer: Self-pay | Admitting: Hematology

## 2020-12-31 ENCOUNTER — Encounter: Payer: Self-pay | Admitting: Internal Medicine

## 2020-12-31 ENCOUNTER — Other Ambulatory Visit: Payer: Self-pay

## 2020-12-31 ENCOUNTER — Inpatient Hospital Stay: Payer: Medicare Other

## 2020-12-31 VITALS — BP 118/73 | HR 78 | Temp 99.0°F | Resp 20

## 2020-12-31 DIAGNOSIS — C252 Malignant neoplasm of tail of pancreas: Secondary | ICD-10-CM

## 2020-12-31 DIAGNOSIS — Z7189 Other specified counseling: Secondary | ICD-10-CM

## 2020-12-31 DIAGNOSIS — Z5111 Encounter for antineoplastic chemotherapy: Secondary | ICD-10-CM | POA: Diagnosis not present

## 2020-12-31 MED ORDER — SODIUM CHLORIDE 0.9% FLUSH
10.0000 mL | INTRAVENOUS | Status: DC | PRN
  Administered 2020-12-31: 10 mL

## 2020-12-31 MED ORDER — HEPARIN SOD (PORK) LOCK FLUSH 100 UNIT/ML IV SOLN
500.0000 [IU] | Freq: Once | INTRAVENOUS | Status: AC | PRN
  Administered 2020-12-31: 500 [IU]

## 2020-12-31 NOTE — Progress Notes (Signed)
Internal Medicine Clinic Attending  Case discussed with Dr. Steen  At the time of the visit.  We reviewed the resident's history and exam and pertinent patient test results.  I agree with the assessment, diagnosis, and plan of care documented in the resident's note.  

## 2020-12-31 NOTE — Progress Notes (Signed)
Attestation for Student Documentation:  I personally was present and performed or re-performed the history, physical exam and medical decision-making activities of this service and have verified that the service and findings are accurately documented in the students note.  Madalyn Rob, MD

## 2021-01-11 MED FILL — Dexamethasone Sodium Phosphate Inj 100 MG/10ML: INTRAMUSCULAR | Qty: 1 | Status: AC

## 2021-01-12 ENCOUNTER — Other Ambulatory Visit: Payer: Medicare Other

## 2021-01-12 ENCOUNTER — Other Ambulatory Visit: Payer: Self-pay | Admitting: Hematology

## 2021-01-12 ENCOUNTER — Telehealth: Payer: Self-pay | Admitting: *Deleted

## 2021-01-12 ENCOUNTER — Other Ambulatory Visit: Payer: Self-pay

## 2021-01-12 ENCOUNTER — Ambulatory Visit: Payer: Medicare Other

## 2021-01-12 ENCOUNTER — Ambulatory Visit: Payer: Medicare Other | Admitting: Hematology

## 2021-01-12 DIAGNOSIS — R600 Localized edema: Secondary | ICD-10-CM

## 2021-01-12 MED ORDER — DIPHENOXYLATE-ATROPINE 2.5-0.025 MG PO TABS: 1.0000 | ORAL_TABLET | Freq: Four times a day (QID) | ORAL | 0 refills | Status: AC | PRN

## 2021-01-12 NOTE — Telephone Encounter (Signed)
Call from pt - stated she has been having diarrhea since Monday and she was throwing up yesterday. Stated she had called the cancer ctr to cancel her appt today. Requesting something for the diarrhea. Loperamide is on pt's current med list. Thanks

## 2021-01-12 NOTE — Progress Notes (Signed)
Pt called to cancel her appts today d/t stomach virus.  Notified Dr. Burr Medico and sent message to scheduling to have them reschedule the pt next week.

## 2021-01-12 NOTE — Telephone Encounter (Signed)
Called pt - stated she did talk to Dr Ernestina Penna nurse about the diarrhea and vomiting. Pt stated this did not start until after she ate something; she does not think it's related to the chemo.

## 2021-01-12 NOTE — Telephone Encounter (Signed)
Please let her know that she should not take with the loperamide.  And to avoid taking this medication more than prescribed.   Gilles Chiquito, MD

## 2021-01-12 NOTE — Telephone Encounter (Signed)
Pt called / informed of Lomotil rx and not to take it with Loperamide. And avoid getting dehydrated Stated she understands and has been drinking plenty of water.

## 2021-01-14 ENCOUNTER — Other Ambulatory Visit: Payer: Self-pay | Admitting: Hematology

## 2021-01-14 ENCOUNTER — Telehealth: Payer: Self-pay | Admitting: Hematology

## 2021-01-14 DIAGNOSIS — Z7189 Other specified counseling: Secondary | ICD-10-CM

## 2021-01-14 DIAGNOSIS — C252 Malignant neoplasm of tail of pancreas: Secondary | ICD-10-CM

## 2021-01-14 NOTE — Telephone Encounter (Signed)
Spoke with pt to r/s missed appt on 12/28, Told pt we are overbooked all next week, offered to send pt to another location, pt refused other location. Offered first available day (1/9) , pt refused saying she needs to come in the week of (1/3). Pt requested to speak with nurse so I transferred her to Dr Ernestina Penna RN

## 2021-01-18 ENCOUNTER — Inpatient Hospital Stay: Payer: Medicare Other

## 2021-01-18 ENCOUNTER — Other Ambulatory Visit: Payer: Self-pay

## 2021-01-18 ENCOUNTER — Encounter: Payer: Self-pay | Admitting: Hematology

## 2021-01-18 ENCOUNTER — Inpatient Hospital Stay: Payer: Medicare Other | Attending: Nurse Practitioner | Admitting: Hematology

## 2021-01-18 DIAGNOSIS — Z95828 Presence of other vascular implants and grafts: Secondary | ICD-10-CM

## 2021-01-18 DIAGNOSIS — C252 Malignant neoplasm of tail of pancreas: Secondary | ICD-10-CM | POA: Insufficient documentation

## 2021-01-18 DIAGNOSIS — Z5111 Encounter for antineoplastic chemotherapy: Secondary | ICD-10-CM | POA: Insufficient documentation

## 2021-01-18 DIAGNOSIS — C787 Secondary malignant neoplasm of liver and intrahepatic bile duct: Secondary | ICD-10-CM | POA: Diagnosis not present

## 2021-01-18 LAB — CMP (CANCER CENTER ONLY)
ALT: 19 U/L (ref 0–44)
AST: 29 U/L (ref 15–41)
Albumin: 3.3 g/dL — ABNORMAL LOW (ref 3.5–5.0)
Alkaline Phosphatase: 132 U/L — ABNORMAL HIGH (ref 38–126)
Anion gap: 5 (ref 5–15)
BUN: 7 mg/dL — ABNORMAL LOW (ref 8–23)
CO2: 30 mmol/L (ref 22–32)
Calcium: 8.7 mg/dL — ABNORMAL LOW (ref 8.9–10.3)
Chloride: 104 mmol/L (ref 98–111)
Creatinine: 0.61 mg/dL (ref 0.44–1.00)
GFR, Estimated: >60 mL/min (ref >60)
Glucose, Bld: 213 mg/dL — ABNORMAL HIGH (ref 70–99)
Potassium: 3.9 mmol/L (ref 3.5–5.1)
Sodium: 139 mmol/L (ref 135–145)
Total Bilirubin: 0.5 mg/dL (ref 0.3–1.2)
Total Protein: 6.7 g/dL (ref 6.5–8.1)

## 2021-01-18 LAB — CBC WITH DIFFERENTIAL (CANCER CENTER ONLY)
Abs Immature Granulocytes: 0.01 10*3/uL (ref 0.00–0.07)
Basophils Absolute: 0 10*3/uL (ref 0.0–0.1)
Basophils Relative: 1 %
Eosinophils Absolute: 0.3 10*3/uL (ref 0.0–0.5)
Eosinophils Relative: 7 %
HCT: 28.8 % — ABNORMAL LOW (ref 36.0–46.0)
Hemoglobin: 9 g/dL — ABNORMAL LOW (ref 12.0–15.0)
Immature Granulocytes: 0 %
Lymphocytes Relative: 13 %
Lymphs Abs: 0.6 10*3/uL — ABNORMAL LOW (ref 0.7–4.0)
MCH: 26.8 pg (ref 26.0–34.0)
MCHC: 31.3 g/dL (ref 30.0–36.0)
MCV: 85.7 fL (ref 80.0–100.0)
Monocytes Absolute: 0.8 10*3/uL (ref 0.1–1.0)
Monocytes Relative: 18 %
Neutro Abs: 2.8 10*3/uL (ref 1.7–7.7)
Neutrophils Relative %: 61 %
Platelet Count: 169 10*3/uL (ref 150–400)
RBC: 3.36 MIL/uL — ABNORMAL LOW (ref 3.87–5.11)
RDW: 18.6 % — ABNORMAL HIGH (ref 11.5–15.5)
WBC Count: 4.5 10*3/uL (ref 4.0–10.5)
nRBC: 0 % (ref 0.0–0.2)

## 2021-01-18 MED ORDER — SODIUM CHLORIDE 0.9% FLUSH
10.0000 mL | Freq: Once | INTRAVENOUS | Status: AC
  Administered 2021-01-18: 10 mL

## 2021-01-18 NOTE — Progress Notes (Signed)
Pt requested to stay accessed for infusion appt in the AM at the [facility] location.

## 2021-01-18 NOTE — Progress Notes (Signed)
Mariah Henderson   [phone] [phone]) [phone]   Clinic Follow up Note   Patient Care Team: Sid Falcon, MD as PCP - General (Internal Medicine) Forrest Moron, [place] (Optometry) Jonnie Finner, RN (Inactive) as Oncology Nurse Navigator Truitt Merle, MD as Consulting Physician (Oncology) Gwyndolyn Kaufman, RN (Inactive) as Registered Nurse  Date of Service:  01/18/2021  I connected with Mariah Henderson on 01/18/21 at  2:40 PM EST by telephone and verified that I am speaking with the correct person using two identifiers.   I discussed the limitations, risks, security and privacy concerns of performing an evaluation and management service by telephone and the availability of in person appointments. I also discussed with the patient that there may be a patient responsible charge related to this service. The patient expressed understanding and agreed to proceed.   Patient's location:  Car  Provider's location:  Home   CHIEF COMPLAINT: f/u of pancreatic cancer  CURRENT THERAPY:  Third line FOLFOX q2 weeks, dose reduced oxali and 5FU, started 12/15/20  ASSESSMENT & PLAN:  Mariah Henderson is a 71 y.o. female with   1. Pancreatic cancer with Liver metastasis, stage IV  -Her CT AP from 01/13/20 showed 2cm mass on tail of pancreas abutting but not invading the gastric wall, with likely liver and omental metastasis.  -Her liver biopsy from 01/28/20 showed adenocarcinoma from pancreatic primary. Her 02/11/20 CT Chest showed no metastasis  -She began first-line palliative chemo with gemcitabine and Abraxane every 2 weeks beginning 02/26/20, she progressed in the liver  - changed to second line liposomal irinotecan/leuc and 5FU beginning 06/02/20. Dose reduced for cycle 1. Unfortunately CT CAP 11/25/20 shows pulmonary progression -she began third line FOLFOX on 12/15/20, with dose reduced oxaliplatin for neuropathy. She tolerated well overall. Neuropathy is stable  -labs  reviewed, stable and adequate to proceed with C3 treatment tomorrow at same dose  -plan to repeat CT after cycle 5 or 6    2. Peripheral neuropathy secondary to chemotherapy -she had pre-existing neuropathy prior to treatment, worsened on chemo -referred to Dr. [name], improved on higher dose gabapentin    -tuning fork exam 12/15/20 shows moderately decreased vibratory sense over the fingertips L>R -overall stable    3. Comorbidities: Asthma, Borderline Diabetic, GERD, Heart murmur, HLD, HTN, arthritis -On medications, including multiple diuretics. -Managed by PCP.   -compression stockings for leg edema    4. Social support -She lives alone in 1 level house and she is retired. She has 1 adult son who lives in [place]. She has a twin and 3 other sisters who live in [place], and other siblings and relative in [place].  -Her son will help her at home  -She has approved for grant to help with medication. Her sister will notify us of the type of grant.    5. Cytopenias -secondary to chemotherapy   -stable.    6. Goal of care discussion -The patient understands the goal of care is palliative. -currently full code    7. Hypokalemia secondary to diuretic -She is on Lasix and dieretics for her LE edema and heart.  -Doppler 05/19/20 negative for DVT -LE edema fluctuates, increased now. Restart lasix 20 mg po once daily on days 1-3 of chemo, then 10 mg daily    8. Genetic testing from 02/19/20 was negative for pathogenetic mutation with VUS of gene NF1   PLAN: -proceed with C3 FOLFOX at same reduced dose tomorrow at [facility]. I will take  a look at her right hand edema when she returns for pump d/c on Friday  -lab, flush, f/u, and FOLFOX in 2 weeks   No problem-specific Assessment & Plan notes found for this encounter.    SUMMARY OF ONCOLOGIC HISTORY: Oncology History Overview Note  Cancer Staging Pancreatic cancer [facility]) Staging form: Exocrine Pancreas, AJCC 8th Edition - Clinical: Stage IV  (cT1c, cN0, pM1) - Signed by Truitt Merle, MD on 02/26/2020 Total positive nodes: 0    Pancreatic cancer ([place])  01/13/2020 Imaging   US Abdomen 01/13/20  IMPRESSION: 1. Suggestion of 3 vascular hypoechoic mass within the liver measuring up to 2.5 cm. 2. Suggestion of a hypoechoic mass within the tail of the pancreas that is not well visualized. 3. Findings concerning for malignancy, please see separately dictated CT abdomen pelvis 01/13/2020.   01/13/2020 Imaging   CT AP 01/13/20  IMPRESSION: 1. Hypoenhancing 2.0 cm in long axis mass in the pancreatic tail compatible with pancreatic adenocarcinoma. The mass abuts the posterior gastric wall but without definite gastric invasion. The splenic vein appears narrowed/attenuated compared to prior exams and the lesion abuts the margin of the splenic artery. 2. Hypoenhancing hepatic masses are new compared to 11/22/2017 and are highly suspicious for metastatic lesions. 3. Questionable 0.8 by 0.8 cm omental tumor nodule versus dense diverticulum above the transverse colon. 4. Other imaging findings of potential clinical significance: Mild distal esophageal wall thickening, query esophagitis. Stable left adrenal adenoma. Sigmoid colon diverticulosis. Lumbar and thoracic spondylosis and degenerative disc disease contributing to multilevel impingement. 5. Aortic atherosclerosis. Aortic Atherosclerosis (ICD10-I70.0).   01/21/2020 Initial Diagnosis   Pancreatic cancer ([place])   01/28/2020 Initial Biopsy   FINAL MICROSCOPIC DIAGNOSIS:   A. LIVER, NEEDLE CORE BIOPSY:  - Adenocarcinoma.   COMMENT:   Immunohistochemistry for CK7 is positive.  CDX-2 demonstrates weak to  moderate positive staining.  TTF-1 and PAX 8 demonstrate weak, likely  nonspecific staining.  CK20, GATA-3 and ER are negative.  The  morphologic and immunophenotypic characteristics are compatible with the  clinical impression of a primary pancreatic cancer.  Radiologic   correlation is encouraged.  If applicable, there is likely sufficient  tissue for ancillary studies (Block A2).  Dr. Vic Ripper reviewed the  case.    02/26/2020 - 05/19/2020 Chemotherapy   First-line Gemcitabine and Abraxane every 2 weeks starting 02/26/20. D/c after 05/19/20 due to disease progression in liver and neuropathy     02/26/2020 Cancer Staging   Staging form: Exocrine Pancreas, AJCC 8th Edition - Clinical: Stage IV (cT1c, cN0, pM1) - Signed by Truitt Merle, MD on 02/26/2020 Total positive nodes: 0    03/08/2020 Genetic Testing   Negative hereditary cancer genetic testing: no pathogenic variants detected in [facility] Common Hereditary Cancers Panel with preliminary evidence pancreatic cancer genes.  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 The report date is March 08, 2020.    The Common Hereditary Cancers Panel with preliminary evidence pancreatic cancer genes offered by [facility] includes sequencing and/or deletion duplication testing of the following 49 genes: APC, ATM, AXIN2, BARD1, BMPR1A, BRCA1, BRCA2, BRIP1, CDH1, CDK4, CDKN2A (p14ARF), CDKN2A (p16INK4a), CHEK2, CTNNA1, DICER1, EPCAM (Deletion/duplication testing only), GREM1 (promoter region deletion/duplication testing only), FANCC, GREM1, HOXB13, KIT, MEN1, MLH1, MSH2, MSH3, MSH6, MUTYH, NBN, NF1, NHTL1, PALB2, PALLD, PDGFRA, PMS2, POLD1, POLE, PTEN, RAD50, RAD51C, RAD51D, SDHA, SDHB, SDHC, SDHD, SMAD4, SMARCA4. STK11, TP53, TSC1, TSC2, and VHL.  The following genes were evaluated for sequence changes only: SDHA and HOXB13 c.251G>A  variant only.es only: SDHA and HOXB13 c.251G>A variant only.   05/03/2020 Imaging   CT AP  IMPRESSION: 1. Interval decrease in size of a hypodense mass of the central pancreatic tail, consistent with treatment response of primary pancreatic malignancy. 2. Numerous low-attenuation liver lesions are seen, increased in size and number compared to prior examination.  Findings are consistent with worsened hepatic metastatic disease. 3. A previously queried omental nodule adjacent to the transverse colon is more clearly a prominent diverticulum on current examination.   Aortic Atherosclerosis (ICD10-I70.0).   06/02/2020 -  Chemotherapy   Second-line FOLFIRI q2weeks starting 06/02/20    12/15/2020 -  Chemotherapy   Patient is on Treatment Plan : PANCREAS FOLFOX q14d        INTERVAL HISTORY:  Mariah Henderson is scheduled for a virtual visit before her next chemo treatment tomorrow.  She tolerated her last chemotherapy well.  She developed fever, nausea and diarrhea for 2 to 3 days last week, and did not come in for treatment.  She was tested negative for COVID at home.  Her sister was sick with similar symptoms before her.  She recovered completely, and back to her baseline now.  Her peripheral neuropathy is stable, she complains of mild hand swelling on right side.  No edema in the arm or pain.   All other systems were reviewed with the patient and are negative.  MEDICAL HISTORY:  Past Medical History:  Diagnosis Date   Acute medial meniscus tear    right knee   Anxiety    Asthma    Cancer ([place])    Cervical spondylosis    Chronic serous otitis media    Constipation    COPD (chronic obstructive pulmonary disease) (HCC)    Depression    followed by [name]; no longer of depakote, seroquel   Diabetes mellitus without complication ([place])    Dysuria 12/27/2009   Qualifier: Diagnosis of  By: [name] MD, Amanjot     Foot drop    GERD (gastroesophageal reflux disease)    Heart murmur    Hyperlipidemia    Hypertension    Low back pain    Obesity    Paraumbilical hernia    Pre-diabetes     SURGICAL HISTORY: Past Surgical History:  Procedure Laterality Date   ABDOMINAL HYSTERECTOMY     ANTERIOR CERVICAL DECOMP/DISCECTOMY FUSION N/A 10/05/2014   Procedure: ACDF - C5-C6 - C6-C7;  Surgeon: Karie Chimera, MD;  Location: [facility];  Service:  Neurosurgery;  Laterality: N/A;  ACDF - C5-C6 - C6-C7   CHOLECYSTECTOMY     COLONOSCOPY     FOOT SURGERY     HERNIA REPAIR  1999   IR IMAGING GUIDED PORT INSERTION  02/25/2020   IR US GUIDANCE  01/28/2020   KNEE ARTHROSCOPY WITH MEDIAL MENISECTOMY Right 06/14/2016   Procedure: RIGHT KNEE ARTHROSCOPY WITH PARTIAL MEDIAL MENISCECTOMY, SUBCHONDROPLASTY;  Surgeon: Leandrew Koyanagi, MD;  Location: [facility];  Service: Orthopedics;  Laterality: Right;   KNEE ARTHROSCOPY WITH SUBCHONDROPLASTY Right 06/14/2016   Procedure: KNEE ARTHROSCOPY WITH SUBCHONDROPLASTY;  Surgeon: Leandrew Koyanagi, MD;  Location: [facility];  Service: Orthopedics;  Laterality: Right;   RADIOACTIVE SEED GUIDED EXCISIONAL BREAST BIOPSY Left 06/23/2014   Procedure: RADIOACTIVE SEED GUIDED EXCISIONAL BREAST BIOPSY;  Surgeon: Stark Klein, MD;  Location: [facility];  Service: General;  Laterality: Left;    I have reviewed the social history and family history with the patient and they  are unchanged from previous note.  ALLERGIES:  has No Known Allergies.  MEDICATIONS:  Current Outpatient Medications  Medication Sig Dispense Refill   alendronate (FOSAMAX) 70 MG tablet TAKE 1 TABLET BY MOUTH  EVERY 7 DAYS WITH A FULL  GLASS OF WATER ON AN EMPTY  STOMACH 12 tablet 3   Accu-Chek Softclix Lancets lancets Check blood sugar once a day as instructed 102 each 3   albuterol (PROVENTIL) (2.5 MG/3ML) 0.083% nebulizer solution Take 3 mLs (2.5 mg total) by nebulization every 6 (six) hours as needed for wheezing. 1080 mL 3   albuterol (VENTOLIN HFA) 108 (90 Base) MCG/ACT inhaler INHALE 1 TO 2 PUFFS INTO THE LUNGS EVERY 6 HOURS AS NEEDED FOR WHEEZING OR SHORTNESS OF BREATH 54 g 1   atorvastatin (LIPITOR) 40 MG tablet TAKE 1 TABLET BY MOUTH EVERY DAY 90 tablet 3   benazepril (LOTENSIN) 20 MG tablet TAKE 1 TABLET(20 MG) BY MOUTH DAILY 90 tablet 3   Blood Glucose Monitoring Suppl (ONETOUCH VERIO REFLECT) w/Device  KIT Check blood sugar once a day as instructed 1 kit 0   celecoxib (CELEBREX) 100 MG capsule TAKE 1 CAPSULE BY MOUTH  TWICE DAILY 180 capsule 3   cholecalciferol (VITAMIN D) 1000 units tablet Take 2,000 Units by mouth daily.     diclofenac Sodium (VOLTAREN) 1 % GEL Apply 2 g topically 4 (four) times daily. 100 g 2   diphenoxylate-atropine (LOMOTIL) 2.5-0.025 MG tablet Take 1 tablet by mouth 4 (four) times daily as needed for diarrhea or loose stools. 30 tablet 0   famotidine (PEPCID) 40 MG tablet TAKE 1/2 TABLET(20 MG) BY MOUTH TWICE DAILY 30 tablet 3   fluticasone (FLONASE) 50 MCG/ACT nasal spray Place 1 spray into both nostrils daily. 16 g 3   Fluticasone-Salmeterol (ADVAIR DISKUS) 100-50 MCG/DOSE AEPB Inhale 1 puff into the lungs 2 (two) times daily. 60 each 11   furosemide (LASIX) 20 MG tablet TAKE 1 TABLET DAILY ON DAY OF CHEMO AND WHILE 5FU INFUSING THEN 1/2 TABLET DAILY 30 tablet 1   gabapentin (NEURONTIN) 100 MG capsule TAKE 1 CAPSULE(100 MG) BY MOUTH THREE TIMES DAILY 90 capsule 1   gabapentin (NEURONTIN) 300 MG capsule Take 1 capsule (300 mg total) by mouth 2 (two) times daily. 60 capsule 3   gabapentin (NEURONTIN) 800 MG tablet Take 800 mg by mouth daily.     glucose blood (ONETOUCH VERIO) test strip Check blood sugar once a day as instructed 100 each 3   HYDROcodone-acetaminophen (NORCO/VICODIN) 5-325 MG tablet Take 1 tablet by mouth 2 (two) times daily as needed for severe pain. 60 tablet 0   ipratropium (ATROVENT) 0.02 % nebulizer solution Take 0.5 mg by nebulization every 6 (six) hours as needed for wheezing or shortness of breath.     latanoprost (XALATAN) 0.005 % ophthalmic solution PLACE 1 DROP INTO BOTH EYES AT BEDTIME  0   loperamide (LOPERAMIDE A-D) 2 MG tablet Take 1 tablet (2 mg total) by mouth 2 (two) times daily as needed for diarrhea or loose stools. 6 tablet 0   loratadine (CLARITIN) 10 MG tablet Take 1 tablet (10 mg total) by mouth daily. 30 tablet 11   magic mouthwash  w/lidocaine SOLN Take 5 mLs by mouth 4 (four) times daily as needed for mouth pain. 480 mL 2   meclizine (ANTIVERT) 12.5 MG tablet TAKE 1 TABLET(12.5 MG) BY MOUTH TWICE DAILY AS NEEDED FOR DIZZINESS 15 tablet 3   potassium chloride SA (KLOR-CON) 20 MEQ tablet TAKE 1  TABLET BY MOUTH THREE TIMES DAILY 270 tablet 0   prochlorperazine (COMPAZINE) 10 MG tablet TAKE 1 TABLET(10 MG) BY MOUTH EVERY 6 HOURS AS NEEDED FOR NAUSEA OR VOMITING 30 tablet 3   risperiDONE (RISPERDAL) 0.25 MG tablet Take 0.25 mg by mouth daily as needed.     rivaroxaban (XARELTO) 20 MG TABS tablet Take 1 tablet (20 mg total) by mouth daily with supper. 30 tablet 3   sertraline (ZOLOFT) 50 MG tablet Take 50 mg by mouth daily.     traZODone (DESYREL) 50 MG tablet Take 25-50 mg by mouth at bedtime as needed for sleep.      triamcinolone cream (KENALOG) 0.1 % apply to affected area twice a day 454 g 1   No current facility-administered medications for this visit.    PHYSICAL EXAMINATION: ECOG PERFORMANCE STATUS: 2 - Symptomatic, <50% confined to bed  There were no vitals filed for this visit. Wt Readings from Last 3 Encounters:  12/29/20 138 lb (62.6 kg)  12/28/20 136 lb 4.8 oz (61.8 kg)  12/24/20 136 lb 3.2 oz (61.8 kg)    No exam today   LABORATORY DATA:  I have reviewed the data as listed CBC Latest Ref Rng & Units 01/18/2021 12/29/2020 12/15/2020  WBC 4.0 - 10.5 K/uL 4.5 4.8 4.3  Hemoglobin 12.0 - 15.0 g/dL 9.0(L) 8.5(L) 8.5(L)  Hematocrit 36.0 - 46.0 % 28.8(L) 27.2(L) 27.2(L)  Platelets 150 - 400 K/uL 169 170 155     CMP Latest Ref Rng & Units 01/18/2021 12/29/2020 12/15/2020  Glucose 70 - 99 mg/dL 213(H) 211(H) 208(H)  BUN 8 - 23 mg/dL 7(L) 13 15  Creatinine 0.44 - 1.00 mg/dL 0.61 0.76 0.68  Sodium 135 - 145 mmol/L 139 140 135  Potassium 3.5 - 5.1 mmol/L 3.9 4.4 3.3(L)  Chloride 98 - 111 mmol/L 104 107 104  CO2 22 - 32 mmol/L '30 25 28  ' Calcium 8.9 - 10.3 mg/dL 8.7(L) 8.6(L) 8.6(L)  Total Protein 6.5 - 8.1  g/dL 6.7 6.2(L) 6.0(L)  Total Bilirubin 0.3 - 1.2 mg/dL 0.5 0.4 0.6  Alkaline Phos 38 - 126 U/L 132(H) 124 114  AST 15 - 41 U/L 29 36 32  ALT 0 - 44 U/L '19 24 26      ' RADIOGRAPHIC STUDIES: I have personally reviewed the radiological images as listed and agreed with the findings in the report. No results found.    No orders of the defined types were placed in this encounter.  I discussed the assessment and treatment plan with the patient. The patient was provided an opportunity to ask questions and all were answered. The patient agreed with the plan and demonstrated an understanding of the instructions.   The patient was advised to call back or seek an in-person evaluation if the symptoms worsen or if the condition fails to improve as anticipated.  I provided 22 minutes of non face-to-face telephone visit time during this encounter, and > 50% was spent counseling as documented under my assessment & plan.     Truitt Merle, MD 01/18/2021

## 2021-01-19 ENCOUNTER — Inpatient Hospital Stay: Payer: Medicare Other

## 2021-01-19 VITALS — BP 120/67 | HR 67 | Temp 97.8°F | Resp 18 | Ht 60.0 in | Wt 135.2 lb

## 2021-01-19 DIAGNOSIS — C787 Secondary malignant neoplasm of liver and intrahepatic bile duct: Secondary | ICD-10-CM | POA: Diagnosis not present

## 2021-01-19 DIAGNOSIS — C252 Malignant neoplasm of tail of pancreas: Secondary | ICD-10-CM | POA: Diagnosis not present

## 2021-01-19 DIAGNOSIS — Z5111 Encounter for antineoplastic chemotherapy: Secondary | ICD-10-CM | POA: Diagnosis not present

## 2021-01-19 DIAGNOSIS — Z7189 Other specified counseling: Secondary | ICD-10-CM

## 2021-01-19 MED ORDER — SODIUM CHLORIDE 0.9 % IV SOLN
10.0000 mg | Freq: Once | INTRAVENOUS | Status: AC
  Administered 2021-01-19: 10 mg via INTRAVENOUS
  Filled 2021-01-19: qty 1

## 2021-01-19 MED ORDER — LEUCOVORIN CALCIUM INJECTION 350 MG
400.0000 mg/m2 | Freq: Once | INTRAVENOUS | Status: AC
  Administered 2021-01-19: 660 mg via INTRAVENOUS
  Filled 2021-01-19: qty 33

## 2021-01-19 MED ORDER — SODIUM CHLORIDE 0.9 % IV SOLN
2000.0000 mg/m2 | INTRAVENOUS | Status: DC
  Administered 2021-01-19: 3300 mg via INTRAVENOUS
  Filled 2021-01-19: qty 66

## 2021-01-19 MED ORDER — OXALIPLATIN CHEMO INJECTION 100 MG/20ML
70.0000 mg/m2 | Freq: Once | INTRAVENOUS | Status: AC
  Administered 2021-01-19: 115 mg via INTRAVENOUS
  Filled 2021-01-19: qty 20

## 2021-01-19 MED ORDER — DEXTROSE 5 % IV SOLN: Freq: Once | INTRAVENOUS | Status: AC

## 2021-01-19 MED ORDER — PALONOSETRON HCL INJECTION 0.25 MG/5ML
0.2500 mg | Freq: Once | INTRAVENOUS | Status: AC
  Administered 2021-01-19: 0.25 mg via INTRAVENOUS
  Filled 2021-01-19: qty 5

## 2021-01-19 NOTE — Patient Instructions (Signed)
[facility]   °Discharge Instructions: °Thank you for choosing [facility] to provide your oncology and hematology care.  ° °If you have a lab appointment with the Cancer Center, please go directly to the Cancer Center and check in at the registration area. °  °Wear comfortable clothing and clothing appropriate for easy access to any Portacath or PICC line.  ° °We strive to give you quality time with your provider. You may need to reschedule your appointment if you arrive late (15 or more minutes).  Arriving late affects you and other patients whose appointments are after yours.  Also, if you miss three or more appointments without notifying the office, you may be dismissed from the clinic at the provider’s discretion.    °  °For prescription refill requests, have your pharmacy contact our office and allow 72 hours for refills to be completed.   ° °Today you received the following chemotherapy and/or immunotherapy agents Oxaliplatin (ELOXATIN), Leucovorin & Flourouracil (ADRUCIL).    °  °To help prevent nausea and vomiting after your treatment, we encourage you to take your nausea medication as directed. ° °BELOW ARE SYMPTOMS THAT SHOULD BE REPORTED IMMEDIATELY: °*FEVER GREATER THAN 100.4 F (38 °C) OR HIGHER °*CHILLS OR SWEATING °*NAUSEA AND VOMITING THAT IS NOT CONTROLLED WITH YOUR NAUSEA MEDICATION °*UNUSUAL SHORTNESS OF BREATH °*UNUSUAL BRUISING OR BLEEDING °*URINARY PROBLEMS (pain or burning when urinating, or frequent urination) °*BOWEL PROBLEMS (unusual diarrhea, constipation, pain near the anus) °TENDERNESS IN MOUTH AND THROAT WITH OR WITHOUT PRESENCE OF ULCERS (sore throat, sores in mouth, or a toothache) °UNUSUAL RASH, SWELLING OR PAIN  °UNUSUAL VAGINAL DISCHARGE OR ITCHING  ° °Items with * indicate a potential emergency and should be followed up as soon as possible or go to the Emergency Department if any problems should occur. ° °Please show the CHEMOTHERAPY ALERT  CARD or IMMUNOTHERAPY ALERT CARD at check-in to the Emergency Department and triage nurse. ° °Should you have questions after your visit or need to cancel or reschedule your appointment, please contact [facility]  Dept: [phone]  and follow the prompts.  Office hours are 8:00 a.m. to 4:30 p.m. Monday - Friday. Please note that voicemails left after 4:00 p.m. may not be returned until the following business day.  We are closed weekends and major holidays. You have access to a nurse at all times for urgent questions. Please call the main number to the clinic Dept: [phone] and follow the prompts. ° ° °For any non-urgent questions, you may also contact your provider using MyChart. We now offer e-Visits for anyone 18 and older to request care online for non-urgent symptoms. For details visit [email or web address]. °  °Also download the MyChart app! Go to the app store, search "MyChart", open the app, select [place], and log in with your MyChart username and password. ° °Due to Covid, a mask is required upon entering the hospital/clinic. If you do not have a mask, one will be given to you upon arrival. For doctor visits, patients may have 1 support person aged 18 or older with them. For treatment visits, patients cannot have anyone with them due to current Covid guidelines and our immunocompromised population.  ° °Oxaliplatin Injection °What is this medication? °OXALIPLATIN (ox AL i PLA tin) is a chemotherapy drug. It targets fast dividing cells, like cancer cells, and causes these cells to die. This medicine is used to treat cancers of the colon and rectum, and   many other cancers. This medicine may be used for other purposes; ask your health care provider or pharmacist if you have questions. COMMON BRAND NAME(S): Eloxatin What should I tell my care team before I take this medication? They need to know if you have any of these conditions: heart disease history of irregular  heartbeat liver disease low blood counts, like white cells, platelets, or red blood cells lung or breathing disease, like asthma take medicines that treat or prevent blood clots tingling of the fingers or toes, or other nerve disorder an unusual or allergic reaction to oxaliplatin, other chemotherapy, other medicines, foods, dyes, or preservatives pregnant or trying to get pregnant breast-feeding How should I use this medication? This drug is given as an infusion into a vein. It is administered in a hospital or clinic by a specially trained health care professional. Talk to your pediatrician regarding the use of this medicine in children. Special care may be needed. Overdosage: If you think you have taken too much of this medicine contact a poison control center or emergency room at once. NOTE: This medicine is only for you. Do not share this medicine with others. What if I miss a dose? It is important not to miss a dose. Call your doctor or health care professional if you are unable to keep an appointment. What may interact with this medication? Do not take this medicine with any of the following medications: cisapride dronedarone pimozide thioridazine This medicine may also interact with the following medications: aspirin and aspirin-like medicines certain medicines that treat or prevent blood clots like warfarin, apixaban, dabigatran, and rivaroxaban cisplatin cyclosporine diuretics medicines for infection like acyclovir, adefovir, amphotericin B, bacitracin, cidofovir, foscarnet, ganciclovir, gentamicin, pentamidine, vancomycin NSAIDs, medicines for pain and inflammation, like ibuprofen or naproxen other medicines that prolong the QT interval (an abnormal heart rhythm) pamidronate zoledronic acid This list may not describe all possible interactions. Give your health care provider a list of all the medicines, herbs, non-prescription drugs, or dietary supplements you use. Also tell  them if you smoke, drink alcohol, or use illegal drugs. Some items may interact with your medicine. What should I watch for while using this medication? Your condition will be monitored carefully while you are receiving this medicine. You may need blood work done while you are taking this medicine. This medicine may make you feel generally unwell. This is not uncommon as chemotherapy can affect healthy cells as well as cancer cells. Report any side effects. Continue your course of treatment even though you feel ill unless your healthcare professional tells you to stop. This medicine can make you more sensitive to cold. Do not drink cold drinks or use ice. Cover exposed skin before coming in contact with cold temperatures or cold objects. When out in cold weather wear warm clothing and cover your mouth and nose to warm the air that goes into your lungs. Tell your doctor if you get sensitive to the cold. Do not become pregnant while taking this medicine or for 9 months after stopping it. Women should inform their health care professional if they wish to become pregnant or think they might be pregnant. Men should not father a child while taking this medicine and for 6 months after stopping it. There is potential for serious side effects to an unborn child. Talk to your health care professional for more information. Do not breast-feed a child while taking this medicine or for 3 months after stopping it. This medicine has caused ovarian failure in  some women. This medicine may make it more difficult to get pregnant. Talk to your health care professional if you are concerned about your fertility. °This medicine has caused decreased sperm counts in some men. This may make it more difficult to father a child. Talk to your health care professional if you are concerned about your fertility. °This medicine may increase your risk of getting an infection. Call your health care professional for advice if you get a fever,  chills, or sore throat, or other symptoms of a cold or flu. Do not treat yourself. Try to avoid being around people who are sick. °Avoid taking medicines that contain aspirin, acetaminophen, ibuprofen, naproxen, or ketoprofen unless instructed by your health care professional. These medicines may hide a fever. °Be careful brushing or flossing your teeth or using a toothpick because you may get an infection or bleed more easily. If you have any dental work done, tell your dentist you are receiving this medicine. °What side effects may I notice from receiving this medication? °Side effects that you should report to your doctor or health care professional as soon as possible: °allergic reactions like skin rash, itching or hives, swelling of the face, lips, or tongue °breathing problems °cough °low blood counts - this medicine may decrease the number of white blood cells, red blood cells, and platelets. You may be at increased risk for infections and bleeding °nausea, vomiting °pain, redness, or irritation at site where injected °pain, tingling, numbness in the hands or feet °signs and symptoms of bleeding such as bloody or black, tarry stools; red or dark brown urine; spitting up blood or brown material that looks like coffee grounds; red spots on the skin; unusual bruising or bleeding from the eyes, gums, or nose °signs and symptoms of a dangerous change in heartbeat or heart rhythm like chest pain; dizziness; fast, irregular heartbeat; palpitations; feeling faint or lightheaded; falls °signs and symptoms of infection like fever; chills; cough; sore throat; pain or trouble passing urine °signs and symptoms of liver injury like dark yellow or brown urine; general ill feeling or flu-like symptoms; light-colored stools; loss of appetite; nausea; right upper belly pain; unusually weak or tired; yellowing of the eyes or skin °signs and symptoms of low red blood cells or anemia such as unusually weak or tired; feeling faint  or lightheaded; falls °signs and symptoms of muscle injury like dark urine; trouble passing urine or change in the amount of urine; unusually weak or tired; muscle pain; back pain °Side effects that usually do not require medical attention (report to your doctor or health care professional if they continue or are bothersome): °changes in taste °diarrhea °gas °hair loss °loss of appetite °mouth sores °This list may not describe all possible side effects. Call your doctor for medical advice about side effects. You may report side effects to FDA at 1-800-FDA-1088. °Where should I keep my medication? °This drug is given in a hospital or clinic and will not be stored at home. °NOTE: This sheet is a summary. It may not cover all possible information. If you have questions about this medicine, talk to your doctor, pharmacist, or health care provider. °© 2022 [employer] Standard (2020-09-21 00:00:00) ° °Leucovorin injection °What is this medication? °LEUCOVORIN (loo koe VOR in) is used to prevent or treat the harmful effects of some medicines. This medicine is used to treat anemia caused by a low amount of folic acid in the body. It is also used with 5-fluorouracil (5-FU) to treat colon   cancer. This medicine may be used for other purposes; ask your health care provider or pharmacist if you have questions. What should I tell my care team before I take this medication? They need to know if you have any of these conditions: anemia from low levels of vitamin B-12 in the blood an unusual or allergic reaction to leucovorin, folic acid, other medicines, foods, dyes, or preservatives pregnant or trying to get pregnant breast-feeding How should I use this medication? This medicine is for injection into a muscle or into a vein. It is given by a health care professional in a hospital or clinic setting. Talk to your pediatrician regarding the use of this medicine in children. Special care may be needed. Overdosage: If you  think you have taken too much of this medicine contact a poison control center or emergency room at once. NOTE: This medicine is only for you. Do not share this medicine with others. What if I miss a dose? This does not apply. What may interact with this medication? capecitabine fluorouracil phenobarbital phenytoin primidone trimethoprim-sulfamethoxazole This list may not describe all possible interactions. Give your health care provider a list of all the medicines, herbs, non-prescription drugs, or dietary supplements you use. Also tell them if you smoke, drink alcohol, or use illegal drugs. Some items may interact with your medicine. What should I watch for while using this medication? Your condition will be monitored carefully while you are receiving this medicine. This medicine may increase the side effects of 5-fluorouracil, 5-FU. Tell your doctor or health care professional if you have diarrhea or mouth sores that do not get better or that get worse. What side effects may I notice from receiving this medication? Side effects that you should report to your doctor or health care professional as soon as possible: allergic reactions like skin rash, itching or hives, swelling of the face, lips, or tongue breathing problems fever, infection mouth sores unusual bleeding or bruising unusually weak or tired Side effects that usually do not require medical attention (report to your doctor or health care professional if they continue or are bothersome): constipation or diarrhea loss of appetite nausea, vomiting This list may not describe all possible side effects. Call your doctor for medical advice about side effects. You may report side effects to FDA at 1-800-FDA-1088. Where should I keep my medication? This drug is given in a hospital or clinic and will not be stored at home. NOTE: This sheet is a summary. It may not cover all possible information. If you have questions about this  medicine, talk to your doctor, pharmacist, or health care provider.  2022 Else[employer]/Gold Standard (2007-07-11 00:00:00)  Fluorouracil, 5-FU injection What is this medication? FLUOROURACIL, 5-FU (flure oh YOOR a sil) is a chemotherapy drug. It slows the growth of cancer cells. This medicine is used to treat many types of cancer like breast cancer, colon or rectal cancer, pancreatic cancer, and stomach cancer. This medicine may be used for other purposes; ask your health care provider or pharmacist if you have questions. COMMON BRAND NAME(S): Adrucil What should I tell my care team before I take this medication? They need to know if you have any of these conditions: blood disorders dihydropyrimidine dehydrogenase (DPD) deficiency infection (especially a virus infection such as chickenpox, cold sores, or herpes) kidney disease liver disease malnourished, poor nutrition recent or ongoing radiation therapy an unusual or allergic reaction to fluorouracil, other chemotherapy, other medicines, foods, dyes, or preservatives pregnant or trying to get pregnant  breast-feeding How should I use this medication? This drug is given as an infusion or injection into a vein. It is administered in a hospital or clinic by a specially trained health care professional. Talk to your pediatrician regarding the use of this medicine in children. Special care may be needed. Overdosage: If you think you have taken too much of this medicine contact a poison control center or emergency room at once. NOTE: This medicine is only for you. Do not share this medicine with others. What if I miss a dose? It is important not to miss your dose. Call your doctor or health care professional if you are unable to keep an appointment. What may interact with this medication? Do not take this medicine with any of the following medications: live virus vaccines This medicine may also interact with the following medications: medicines  that treat or prevent blood clots like warfarin, enoxaparin, and dalteparin This list may not describe all possible interactions. Give your health care provider a list of all the medicines, herbs, non-prescription drugs, or dietary supplements you use. Also tell them if you smoke, drink alcohol, or use illegal drugs. Some items may interact with your medicine. What should I watch for while using this medication? Visit your doctor for checks on your progress. This drug may make you feel generally unwell. This is not uncommon, as chemotherapy can affect healthy cells as well as cancer cells. Report any side effects. Continue your course of treatment even though you feel ill unless your doctor tells you to stop. In some cases, you may be given additional medicines to help with side effects. Follow all directions for their use. Call your doctor or health care professional for advice if you get a fever, chills or sore throat, or other symptoms of a cold or flu. Do not treat yourself. This drug decreases your body's ability to fight infections. Try to avoid being around people who are sick. This medicine may increase your risk to bruise or bleed. Call your doctor or health care professional if you notice any unusual bleeding. Be careful brushing and flossing your teeth or using a toothpick because you may get an infection or bleed more easily. If you have any dental work done, tell your dentist you are receiving this medicine. Avoid taking products that contain aspirin, acetaminophen, ibuprofen, naproxen, or ketoprofen unless instructed by your doctor. These medicines may hide a fever. Do not become pregnant while taking this medicine. Women should inform their doctor if they wish to become pregnant or think they might be pregnant. There is a potential for serious side effects to an unborn child. Talk to your health care professional or pharmacist for more information. Do not breast-feed an infant while taking  this medicine. Men should inform their doctor if they wish to father a child. This medicine may lower sperm counts. Do not treat diarrhea with over the counter products. Contact your doctor if you have diarrhea that lasts more than 2 days or if it is severe and watery. This medicine can make you more sensitive to the sun. Keep out of the sun. If you cannot avoid being in the sun, wear protective clothing and use sunscreen. Do not use sun lamps or tanning beds/booths. What side effects may I notice from receiving this medication? Side effects that you should report to your doctor or health care professional as soon as possible: allergic reactions like skin rash, itching or hives, swelling of the face, lips, or tongue low blood  counts - this medicine may decrease the number of white blood cells, red blood cells and platelets. You may be at increased risk for infections and bleeding. signs of infection - fever or chills, cough, sore throat, pain or difficulty passing urine signs of decreased platelets or bleeding - bruising, pinpoint red spots on the skin, black, tarry stools, blood in the urine signs of decreased red blood cells - unusually weak or tired, fainting spells, lightheadedness breathing problems changes in vision chest pain mouth sores nausea and vomiting pain, swelling, redness at site where injected pain, tingling, numbness in the hands or feet redness, swelling, or sores on hands or feet stomach pain unusual bleeding Side effects that usually do not require medical attention (report to your doctor or health care professional if they continue or are bothersome): changes in finger or toe nails diarrhea dry or itchy skin hair loss headache loss of appetite sensitivity of eyes to the light stomach upset unusually teary eyes This list may not describe all possible side effects. Call your doctor for medical advice about side effects. You may report side effects to FDA at  1-800-FDA-1088. Where should I keep my medication? This drug is given in a hospital or clinic and will not be stored at home. NOTE: This sheet is a summary. It may not cover all possible information. If you have questions about this medicine, talk to your doctor, pharmacist, or health care provider.  2022 [employer]/Gold Standard (2020-09-21 00:00:00)  The chemotherapy medication bag should finish at 46 hours, 96 hours, or 7 days. For example, if your pump is scheduled for 46 hours and it was put on at 4:00 p.m., it should finish at 2:00 p.m. the day it is scheduled to come off regardless of your appointment time.     Estimated time to finish at 10:30 a.m. on Friday 01/21/2021.   If the display on your pump reads "Low Volume" and it is beeping, take the batteries out of the pump and come to the cancer center for it to be taken off.   If the pump alarms go off prior to the pump reading "Low Volume" then call [phone] and someone can assist you.  If the plunger comes out and the chemotherapy medication is leaking out, please use your home chemo spill kit to clean up the spill. Do NOT use paper towels or other household products.  If you have problems or questions regarding your pump, please call either [phone] (24 hours a day) or the cancer center Monday-Friday 8:00 a.m.- 4:30 p.m. at the clinic number and we will assist you. If you are unable to get assistance, then go to the nearest Emergency Department and ask the staff to contact the IV team for assistance.

## 2021-01-19 NOTE — Progress Notes (Signed)
Patient presents for treatment. RN assessment completed along with the following:  Labs/vitals reviewed - Yes, and within treatment parameters.   Weight within 10% of previous measurement - Yes Oncology Treatment Attestation completed for current therapy- Yes, on date 12/03/2020 Informed consent completed and reflects current therapy/intent - Yes, on date 12/15/2020             Provider progress note reviewed - Patient not seen by provider today. Most recent note dated 01/19/2020 reviewed. Treatment/Antibody/Supportive plan reviewed - Yes, and there are no adjustments needed for today's treatment. S&H and other orders reviewed - Yes, and there are no additional orders identified. Previous treatment date reviewed - Yes, and the appropriate amount of time has elapsed between treatments. Clinic Hand Off Received from - No.  Patient to proceed with treatment.

## 2021-01-20 ENCOUNTER — Telehealth: Payer: Self-pay | Admitting: Hematology

## 2021-01-20 NOTE — Telephone Encounter (Signed)
Left message with follow-up appointments per 1/3 los.

## 2021-01-21 ENCOUNTER — Inpatient Hospital Stay: Payer: Medicare Other

## 2021-01-21 ENCOUNTER — Other Ambulatory Visit: Payer: Self-pay

## 2021-01-21 VITALS — BP 128/56 | HR 83 | Temp 98.3°F | Resp 18

## 2021-01-21 DIAGNOSIS — C787 Secondary malignant neoplasm of liver and intrahepatic bile duct: Secondary | ICD-10-CM | POA: Diagnosis not present

## 2021-01-21 DIAGNOSIS — C252 Malignant neoplasm of tail of pancreas: Secondary | ICD-10-CM

## 2021-01-21 DIAGNOSIS — Z5111 Encounter for antineoplastic chemotherapy: Secondary | ICD-10-CM | POA: Diagnosis not present

## 2021-01-21 DIAGNOSIS — Z7189 Other specified counseling: Secondary | ICD-10-CM

## 2021-01-21 MED ORDER — SODIUM CHLORIDE 0.9% FLUSH
10.0000 mL | INTRAVENOUS | Status: DC | PRN
  Administered 2021-01-21: 10 mL

## 2021-01-21 MED ORDER — HEPARIN SOD (PORK) LOCK FLUSH 100 UNIT/ML IV SOLN
500.0000 [IU] | Freq: Once | INTRAVENOUS | Status: AC | PRN
  Administered 2021-01-21: 500 [IU]

## 2021-01-25 ENCOUNTER — Encounter: Payer: Self-pay | Admitting: Student

## 2021-01-25 ENCOUNTER — Other Ambulatory Visit: Payer: Self-pay | Admitting: Internal Medicine

## 2021-01-25 ENCOUNTER — Ambulatory Visit (INDEPENDENT_AMBULATORY_CARE_PROVIDER_SITE_OTHER): Payer: Medicare Other | Admitting: Student

## 2021-01-25 ENCOUNTER — Other Ambulatory Visit: Payer: Self-pay

## 2021-01-25 VITALS — BP 138/86 | HR 75 | Temp 98.8°F | Ht 60.0 in | Wt 138.8 lb

## 2021-01-25 DIAGNOSIS — I10 Essential (primary) hypertension: Secondary | ICD-10-CM | POA: Diagnosis not present

## 2021-01-25 DIAGNOSIS — M546 Pain in thoracic spine: Secondary | ICD-10-CM | POA: Diagnosis not present

## 2021-01-25 DIAGNOSIS — R600 Localized edema: Secondary | ICD-10-CM | POA: Diagnosis not present

## 2021-01-25 MED ORDER — BENAZEPRIL HCL 20 MG PO TABS: 20.0000 mg | ORAL_TABLET | Freq: Every day | ORAL | 3 refills | Status: DC

## 2021-01-25 MED ORDER — POTASSIUM CHLORIDE CRYS ER 20 MEQ PO TBCR: 20.0000 meq | EXTENDED_RELEASE_TABLET | Freq: Every day | ORAL | 0 refills | Status: DC

## 2021-01-25 NOTE — Patient Instructions (Addendum)
For your back pain, please use aspercreme or other topical lidocaine to help with this. Please let us know if this does not help. In additon please continue to make sure you sleep on your side.   For your leg swelling please increase your lasix to 20mg  TWO times a day or 1 tablet, two times a day. Please call if you need a refill.    Do not hesitate to call if your symptoms worsen.

## 2021-01-27 NOTE — Assessment & Plan Note (Signed)
Patient has been worked up for her persistent bilateral lower extremity edema since 2015 with her PCP. She most recently had bilateral venous duplex with no evidence of DVT however the study did not comment on venous reflux. She feels her swelling has worsened since last clinic visit. At her previous clinic visit, it was discussed that if her symptoms do not improve that we could increase her lasix. On exam she has 2+ pitting edema of BLE, no rales.  Unclear exact etiology of patient's persistent lower extremity edema. Her last ECHO was in 2014. Considered right heart dysfunction as a result of OSA. Cardiotoxicity from her chemotherapy. She does not have any evidence of renal dysfunction. She does not have diffuse edema.  -Will obtain ECHO as its been since 2014 -Will increase lasix to 20mg  BID -Will have patient follow up in 2 weeks.  -Will obtain urine protein creatinine ratio at that time

## 2021-01-27 NOTE — Progress Notes (Signed)
° °  CC:   HPI:  Mariah Henderson is a 71 y.o. F with a PMH per below who presents for the acute complaint of back pain and for persistent bilateral lower extremity swelling.  Please see problem based charting under encounters tab for further details.    Past Medical History:  Diagnosis Date   Acute medial meniscus tear    right knee   Anxiety    Asthma    Cancer ([place])    Cervical spondylosis    Chronic serous otitis media    Constipation    COPD (chronic obstructive pulmonary disease) ([place])    Depression    followed by [name]; no longer of depakote, seroquel   Diabetes mellitus without complication ([place])    Dysuria 12/27/2009   Qualifier: Diagnosis of  By: [name] MD, Amanjot     Foot drop    GERD (gastroesophageal reflux disease)    Heart murmur    Hyperlipidemia    Hypertension    Low back pain    Obesity    Paraumbilical hernia    Pre-diabetes    Review of Systems:  Please see problem based charting under encounters tab for further details.    Physical Exam:  Vitals:   01/25/21 1423  BP: 138/86  Pulse: 75  Temp: 98.8 F (37.1 C)  TempSrc: Oral  SpO2: 100%  Weight: 138 lb 12.8 oz (63 kg)  Height: 5' (1.524 m)    Constitutional: Thin and in no distress.  HENT:  Head: Normocephalic and atraumatic.  Eyes: EOM are normal.  Neck: Normal range of motion.  Cardiovascular: Normal rate, regular rhythm, normal heart sounds and intact distal pulses. Exam reveals no gallop and no friction rub.  No murmur heard. Pulmonary/Chest: Effort normal and breath sounds normal. No respiratory distress. He exhibits no tenderness.  Abdominal: Soft. Bowel sounds are normal. Non distended, non tender to palpation Musculoskeletal: Normal range of motion.        General: No tenderness or edema.  Neurological: 5/5 strength of BLE and BUE. Kyphotic changes in thoracic spine, bilateral Parapsinal TTP at ~T5 and T10. Also TTP along midline at these areas.  Skin: Skin is warm and  dry.    Assessment & Plan:   See Encounters Tab for problem based charting.  Patient discussed with Dr. Heber Covelo

## 2021-01-27 NOTE — Assessment & Plan Note (Addendum)
Patient reports that beginning ~7d prior to today's clinic visit she began experiencing back pain. She states that the pain began in the morning and was before she went to her chemo session. She says that the pain is most prominent in the thoracic at ~T5 and T10. She feels the pain is dull and achy in character. When she leans back is when it hurts worst. She is able to ambulate without difficulty. She has tried leaning forward and using tylenol which offer temporary relief. She has also tried to start sleeping on her side which seems to offer some relief. She denies recent trauma or increased activity. She denies fevers or chills.   Of note, DEXA scan from 2020 was noted to have osteopenia in multiple sites she was already on bisphosphonate therapy. There are no reports of pathological fractures in the chart.   A/P: Given patient's paraspinal tenderness to palpation, her symptoms are likely a result of muscle strain. She also has some midline tenderness that is focal in nature. She could have some injury to the spinous process. Recommended that patient start topical lidocaine creams to help with potential muscle strain and use heat/ice as well for this. Finally, recommended that patient change her sleeping position to lying on her side. She will follow up in 2 weeks. If patient continues to have midline pain at that time will consider imaging of the Thoracic spine.

## 2021-02-02 ENCOUNTER — Encounter: Payer: Self-pay | Admitting: Hematology

## 2021-02-02 ENCOUNTER — Inpatient Hospital Stay: Payer: Medicare Other

## 2021-02-02 ENCOUNTER — Inpatient Hospital Stay (HOSPITAL_BASED_OUTPATIENT_CLINIC_OR_DEPARTMENT_OTHER): Payer: Medicare Other | Admitting: Hematology

## 2021-02-02 ENCOUNTER — Other Ambulatory Visit: Payer: Self-pay

## 2021-02-02 VITALS — BP 157/82 | HR 76 | Temp 99.0°F | Resp 18 | Ht 60.0 in | Wt 133.3 lb

## 2021-02-02 DIAGNOSIS — Z5111 Encounter for antineoplastic chemotherapy: Secondary | ICD-10-CM | POA: Diagnosis not present

## 2021-02-02 DIAGNOSIS — C252 Malignant neoplasm of tail of pancreas: Secondary | ICD-10-CM

## 2021-02-02 DIAGNOSIS — Z7189 Other specified counseling: Secondary | ICD-10-CM

## 2021-02-02 DIAGNOSIS — C787 Secondary malignant neoplasm of liver and intrahepatic bile duct: Secondary | ICD-10-CM | POA: Diagnosis not present

## 2021-02-02 DIAGNOSIS — Z95828 Presence of other vascular implants and grafts: Secondary | ICD-10-CM

## 2021-02-02 LAB — CMP (CANCER CENTER ONLY)
ALT: 16 U/L (ref 0–44)
AST: 25 U/L (ref 15–41)
Albumin: 3.3 g/dL — ABNORMAL LOW (ref 3.5–5.0)
Alkaline Phosphatase: 116 U/L (ref 38–126)
Anion gap: 5 (ref 5–15)
BUN: 7 mg/dL — ABNORMAL LOW (ref 8–23)
CO2: 28 mmol/L (ref 22–32)
Calcium: 8.9 mg/dL (ref 8.9–10.3)
Chloride: 105 mmol/L (ref 98–111)
Creatinine: 0.58 mg/dL (ref 0.44–1.00)
GFR, Estimated: >60 mL/min (ref >60)
Glucose, Bld: 171 mg/dL — ABNORMAL HIGH (ref 70–99)
Potassium: 3.9 mmol/L (ref 3.5–5.1)
Sodium: 138 mmol/L (ref 135–145)
Total Bilirubin: 0.4 mg/dL (ref 0.3–1.2)
Total Protein: 6.6 g/dL (ref 6.5–8.1)

## 2021-02-02 LAB — CBC WITH DIFFERENTIAL (CANCER CENTER ONLY)
Abs Immature Granulocytes: 0 10*3/uL (ref 0.00–0.07)
Basophils Absolute: 0 10*3/uL (ref 0.0–0.1)
Basophils Relative: 0 %
Eosinophils Absolute: 0.5 10*3/uL (ref 0.0–0.5)
Eosinophils Relative: 9 %
HCT: 27.3 % — ABNORMAL LOW (ref 36.0–46.0)
Hemoglobin: 8.5 g/dL — ABNORMAL LOW (ref 12.0–15.0)
Immature Granulocytes: 0 %
Lymphocytes Relative: 11 %
Lymphs Abs: 0.6 10*3/uL — ABNORMAL LOW (ref 0.7–4.0)
MCH: 25.5 pg — ABNORMAL LOW (ref 26.0–34.0)
MCHC: 31.1 g/dL (ref 30.0–36.0)
MCV: 82 fL (ref 80.0–100.0)
Monocytes Absolute: 0.7 10*3/uL (ref 0.1–1.0)
Monocytes Relative: 14 %
Neutro Abs: 3.4 10*3/uL (ref 1.7–7.7)
Neutrophils Relative %: 66 %
Platelet Count: 174 10*3/uL (ref 150–400)
RBC: 3.33 MIL/uL — ABNORMAL LOW (ref 3.87–5.11)
RDW: 20.6 % — ABNORMAL HIGH (ref 11.5–15.5)
Smear Review: NORMAL
WBC Count: 5.2 10*3/uL (ref 4.0–10.5)
nRBC: 0 % (ref 0.0–0.2)

## 2021-02-02 MED ORDER — OXALIPLATIN CHEMO INJECTION 100 MG/20ML
70.0000 mg/m2 | Freq: Once | INTRAVENOUS | Status: AC
  Administered 2021-02-02: 115 mg via INTRAVENOUS
  Filled 2021-02-02: qty 23

## 2021-02-02 MED ORDER — TRAMADOL HCL 50 MG PO TABS: 50.0000 mg | ORAL_TABLET | Freq: Three times a day (TID) | ORAL | 0 refills | Status: AC | PRN

## 2021-02-02 MED ORDER — SODIUM CHLORIDE 0.9% FLUSH
10.0000 mL | Freq: Once | INTRAVENOUS | Status: AC
  Administered 2021-02-02: 10 mL

## 2021-02-02 MED ORDER — SODIUM CHLORIDE 0.9 % IV SOLN
10.0000 mg | Freq: Once | INTRAVENOUS | Status: AC
  Administered 2021-02-02: 10 mg via INTRAVENOUS
  Filled 2021-02-02: qty 10

## 2021-02-02 MED ORDER — SODIUM CHLORIDE 0.9 % IV SOLN
2000.0000 mg/m2 | INTRAVENOUS | Status: DC
  Administered 2021-02-02: 3300 mg via INTRAVENOUS
  Filled 2021-02-02: qty 66

## 2021-02-02 MED ORDER — PALONOSETRON HCL INJECTION 0.25 MG/5ML
0.2500 mg | Freq: Once | INTRAVENOUS | Status: AC
  Administered 2021-02-02: 0.25 mg via INTRAVENOUS
  Filled 2021-02-02: qty 5

## 2021-02-02 MED ORDER — DEXTROSE 5 % IV SOLN: Freq: Once | INTRAVENOUS | Status: AC

## 2021-02-02 MED ORDER — LEUCOVORIN CALCIUM INJECTION 350 MG
400.0000 mg/m2 | Freq: Once | INTRAVENOUS | Status: AC
  Administered 2021-02-02: 660 mg via INTRAVENOUS
  Filled 2021-02-02: qty 25

## 2021-02-02 NOTE — Progress Notes (Signed)
Pump settings verified with Janifer Adie, Therapist, sports.

## 2021-02-02 NOTE — Progress Notes (Signed)
[facility]   [phone] Fax:([phone]) [phone]   Clinic Follow up Note   Patient Care Team: Sid Falcon, MD as PCP - General (Internal Medicine) Forrest Moron, [place] (Optometry) Jonnie Finner, RN (Inactive) as Oncology Nurse Navigator Truitt Merle, MD as Consulting Physician (Oncology) Gwyndolyn Kaufman, RN (Inactive) as Registered Nurse  Date of Service:  02/02/2021  CHIEF COMPLAINT: f/u of pancreatic cancer  CURRENT THERAPY:  Third line FOLFOX q2 weeks, dose reduced oxali and 5FU, started 12/15/20  ASSESSMENT & PLAN:  Mariah Henderson is a 71 y.o. female with   1. Pancreatic cancer with Liver metastasis, stage IV  -Her CT AP from 01/13/20 showed 2cm mass on tail of pancreas abutting but not invading the gastric wall, with likely liver and omental metastasis.  -Her liver biopsy from 01/28/20 showed adenocarcinoma from pancreatic primary. Her 02/11/20 CT Chest showed no metastasis  -She began first-line palliative chemo with gemcitabine and Abraxane every 2 weeks beginning 02/26/20, she progressed in the liver  - changed to second line liposomal irinotecan/leuc and 5FU beginning 06/02/20. Dose reduced for cycle 1. Unfortunately CT CAP 11/25/20 shows pulmonary progression -she began third line FOLFOX on 12/15/20, with dose reduced oxaliplatin for neuropathy. She tolerated well overall. Neuropathy is stable to slightly improved. -labs reviewed, stable and adequate to proceed with C4 treatment today at same dose  -plan to repeat CT after cycle 5   2. Symptom Management: Back pain, Loss of appetite -she was seen by her PCP 01/25/21 for new back pain. They recommended trying topical lidocaine first. This has not been helpful. -she previously used hydrocodone (last refilled 03/2019). I will prescribe tramadol for her to try. I advised her to gradually increase as needed. -she reports a decrease in appetite since the pain started. I will provide her with a case of  Ensure.   3. Peripheral neuropathy secondary to chemotherapy -she had pre-existing neuropathy prior to treatment, worsened on chemo -referred to Dr. [name], improved on higher dose gabapentin    -she reports improvement today (02/02/21)   4. Comorbidities: Asthma, Borderline Diabetic, GERD, Heart murmur, HLD, HTN, arthritis -On medications, including multiple diuretics. -Managed by PCP.   -compression stockings for leg edema    5. Social support -She lives alone in 1 level house and she is retired. She has 1 adult son who lives in [place]. She has a twin and 3 other sisters who live in [place], and other siblings and relative in [place].  -Her son will help her at home  -She has approved for grant to help with medication. Her sister will notify us of the type of grant.    6. Cytopenias -secondary to chemotherapy   -stable.    7. Goal of care discussion -The patient understands the goal of care is palliative. -currently full code    8. Hypokalemia secondary to diuretic, LE edema -She is on Lasix and dieretics for her LE edema and heart.  -Doppler 05/19/20 negative for DVT -her PCP increased her lasix to 20 mg BID on 01/25/21. They also plan for repeat echo soon.   9. Genetic testing from 02/19/20 was negative for pathogenetic mutation with VUS of gene NF1     PLAN: -proceed with C4 FOLFOX today -I called in tramadol for her back pain -lab, flush, f/u, and FOLFOX on 02/16/21 as scheduled -restaging CT to be done before C6 in 4 weeks   No problem-specific Assessment & Plan notes found for this encounter.  SUMMARY OF ONCOLOGIC HISTORY: Oncology History Overview Note  Cancer Staging Pancreatic cancer [facility]) Staging form: Exocrine Pancreas, AJCC 8th Edition - Clinical: Stage IV (cT1c, cN0, pM1) - Signed by Truitt Merle, MD on 02/26/2020 Total positive nodes: 0    Pancreatic cancer ([place])  01/13/2020 Imaging   US Abdomen 01/13/20  IMPRESSION: 1. Suggestion of 3 vascular hypoechoic  mass within the liver measuring up to 2.5 cm. 2. Suggestion of a hypoechoic mass within the tail of the pancreas that is not well visualized. 3. Findings concerning for malignancy, please see separately dictated CT abdomen pelvis 01/13/2020.   01/13/2020 Imaging   CT AP 01/13/20  IMPRESSION: 1. Hypoenhancing 2.0 cm in long axis mass in the pancreatic tail compatible with pancreatic adenocarcinoma. The mass abuts the posterior gastric wall but without definite gastric invasion. The splenic vein appears narrowed/attenuated compared to prior exams and the lesion abuts the margin of the splenic artery. 2. Hypoenhancing hepatic masses are new compared to 11/22/2017 and are highly suspicious for metastatic lesions. 3. Questionable 0.8 by 0.8 cm omental tumor nodule versus dense diverticulum above the transverse colon. 4. Other imaging findings of potential clinical significance: Mild distal esophageal wall thickening, query esophagitis. Stable left adrenal adenoma. Sigmoid colon diverticulosis. Lumbar and thoracic spondylosis and degenerative disc disease contributing to multilevel impingement. 5. Aortic atherosclerosis. Aortic Atherosclerosis (ICD10-I70.0).   01/21/2020 Initial Diagnosis   Pancreatic cancer ([place])   01/28/2020 Initial Biopsy   FINAL MICROSCOPIC DIAGNOSIS:   A. LIVER, NEEDLE CORE BIOPSY:  - Adenocarcinoma.   COMMENT:   Immunohistochemistry for CK7 is positive.  CDX-2 demonstrates weak to  moderate positive staining.  TTF-1 and PAX 8 demonstrate weak, likely  nonspecific staining.  CK20, GATA-3 and ER are negative.  The  morphologic and immunophenotypic characteristics are compatible with the  clinical impression of a primary pancreatic cancer.  Radiologic  correlation is encouraged.  If applicable, there is likely sufficient  tissue for ancillary studies (Block A2).  Dr. Vic Ripper reviewed the  case.    02/26/2020 - 05/19/2020 Chemotherapy   First-line Gemcitabine  and Abraxane every 2 weeks starting 02/26/20. D/c after 05/19/20 due to disease progression in liver and neuropathy     02/26/2020 Cancer Staging   Staging form: Exocrine Pancreas, AJCC 8th Edition - Clinical: Stage IV (cT1c, cN0, pM1) - Signed by Truitt Merle, MD on 02/26/2020 Total positive nodes: 0    03/08/2020 Genetic Testing   Negative hereditary cancer genetic testing: no pathogenic variants detected in [facility] Common Hereditary Cancers Panel with preliminary evidence pancreatic cancer genes.  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 The report date is March 08, 2020.    The Common Hereditary Cancers Panel with preliminary evidence pancreatic cancer genes offered by [facility] includes sequencing and/or deletion duplication testing of the following 49 genes: APC, ATM, AXIN2, BARD1, BMPR1A, BRCA1, BRCA2, BRIP1, CDH1, CDK4, CDKN2A (p14ARF), CDKN2A (p16INK4a), CHEK2, CTNNA1, DICER1, EPCAM (Deletion/duplication testing only), GREM1 (promoter region deletion/duplication testing only), FANCC, GREM1, HOXB13, KIT, MEN1, MLH1, MSH2, MSH3, MSH6, MUTYH, NBN, NF1, NHTL1, PALB2, PALLD, PDGFRA, PMS2, POLD1, POLE, PTEN, RAD50, RAD51C, RAD51D, SDHA, SDHB, SDHC, SDHD, SMAD4, SMARCA4. STK11, TP53, TSC1, TSC2, and VHL.  The following genes were evaluated for sequence changes only: SDHA and HOXB13 c.251G>A variant only.es only: SDHA and HOXB13 c.251G>A variant only.   05/03/2020 Imaging   CT AP  IMPRESSION: 1. Interval decrease in size of a hypodense mass of the central pancreatic tail, consistent with treatment response of primary  pancreatic malignancy. 2. Numerous low-attenuation liver lesions are seen, increased in size and number compared to prior examination. Findings are consistent with worsened hepatic metastatic disease. 3. A previously queried omental nodule adjacent to the transverse colon is more clearly a prominent diverticulum on current examination.   Aortic  Atherosclerosis (ICD10-I70.0).   06/02/2020 -  Chemotherapy   Second-line FOLFIRI q2weeks starting 06/02/20    12/15/2020 -  Chemotherapy   Patient is on Treatment Plan : PANCREAS FOLFOX q14d        INTERVAL HISTORY:  Mariah Henderson is here for a follow up of pancreatic cancer. She was last seen by me on 01/18/21. She presents to the clinic accompanied by her sister. She reports back pain today. She had previously noted pain in her shoulder and hands, which have resolved. She reports the pain is near constant, when she is sitting or laying in bed.   All other systems were reviewed with the patient and are negative.  MEDICAL HISTORY:  Past Medical History:  Diagnosis Date   Acute medial meniscus tear    right knee   Anxiety    Asthma    Cancer ([place])    Cervical spondylosis    Chronic serous otitis media    Constipation    COPD (chronic obstructive pulmonary disease) ([facility])    Depression    followed by [name]; no longer of depakote, seroquel   Diabetes mellitus without complication ([place])    Dysuria 12/27/2009   Qualifier: Diagnosis of  By: [name] MD, Amanjot     Foot drop    GERD (gastroesophageal reflux disease)    Heart murmur    Hyperlipidemia    Hypertension    Low back pain    Obesity    Paraumbilical hernia    Pre-diabetes     SURGICAL HISTORY: Past Surgical History:  Procedure Laterality Date   ABDOMINAL HYSTERECTOMY     ANTERIOR CERVICAL DECOMP/DISCECTOMY FUSION N/A 10/05/2014   Procedure: ACDF - C5-C6 - C6-C7;  Surgeon: Karie Chimera, MD;  Location: [facility];  Service: Neurosurgery;  Laterality: N/A;  ACDF - C5-C6 - C6-C7   CHOLECYSTECTOMY     COLONOSCOPY     FOOT SURGERY     HERNIA REPAIR  1999   IR IMAGING GUIDED PORT INSERTION  02/25/2020   IR US GUIDANCE  01/28/2020   KNEE ARTHROSCOPY WITH MEDIAL MENISECTOMY Right 06/14/2016   Procedure: RIGHT KNEE ARTHROSCOPY WITH PARTIAL MEDIAL MENISCECTOMY, SUBCHONDROPLASTY;  Surgeon: Leandrew Koyanagi, MD;   Location: [facility];  Service: Orthopedics;  Laterality: Right;   KNEE ARTHROSCOPY WITH SUBCHONDROPLASTY Right 06/14/2016   Procedure: KNEE ARTHROSCOPY WITH SUBCHONDROPLASTY;  Surgeon: Leandrew Koyanagi, MD;  Location: [facility];  Service: Orthopedics;  Laterality: Right;   RADIOACTIVE SEED GUIDED EXCISIONAL BREAST BIOPSY Left 06/23/2014   Procedure: RADIOACTIVE SEED GUIDED EXCISIONAL BREAST BIOPSY;  Surgeon: Stark Klein, MD;  Location: [facility];  Service: General;  Laterality: Left;    I have reviewed the social history and family history with the patient and they are unchanged from previous note.  ALLERGIES:  has No Known Allergies.  MEDICATIONS:  Current Outpatient Medications  Medication Sig Dispense Refill   alendronate (FOSAMAX) 70 MG tablet TAKE 1 TABLET BY MOUTH  EVERY 7 DAYS WITH A FULL  GLASS OF WATER ON AN EMPTY  STOMACH 12 tablet 3   traMADol (ULTRAM) 50 MG tablet Take 1 tablet (50 mg total) by mouth every 8 (eight)  hours as needed. 30 tablet 0   Accu-Chek Softclix Lancets lancets Check blood sugar once a day as instructed 102 each 3   albuterol (PROVENTIL) (2.5 MG/3ML) 0.083% nebulizer solution Take 3 mLs (2.5 mg total) by nebulization every 6 (six) hours as needed for wheezing. 1080 mL 3   albuterol (VENTOLIN HFA) 108 (90 Base) MCG/ACT inhaler INHALE 1 TO 2 PUFFS INTO THE LUNGS EVERY 6 HOURS AS NEEDED FOR WHEEZING OR SHORTNESS OF BREATH 54 g 1   atorvastatin (LIPITOR) 40 MG tablet TAKE 1 TABLET BY MOUTH EVERY DAY 90 tablet 3   benazepril (LOTENSIN) 20 MG tablet Take 1 tablet (20 mg total) by mouth daily. 90 tablet 3   Blood Glucose Monitoring Suppl (ONETOUCH VERIO REFLECT) w/Device KIT Check blood sugar once a day as instructed 1 kit 0   celecoxib (CELEBREX) 100 MG capsule TAKE 1 CAPSULE BY MOUTH  TWICE DAILY 180 capsule 3   cholecalciferol (VITAMIN D) 1000 units tablet Take 2,000 Units by mouth daily.     diclofenac Sodium (VOLTAREN) 1  % GEL Apply 2 g topically 4 (four) times daily. 100 g 2   diphenoxylate-atropine (LOMOTIL) 2.5-0.025 MG tablet Take 1 tablet by mouth 4 (four) times daily as needed for diarrhea or loose stools. 30 tablet 0   famotidine (PEPCID) 40 MG tablet TAKE 1/2 TABLET(20 MG) BY MOUTH TWICE DAILY 30 tablet 3   fluticasone (FLONASE) 50 MCG/ACT nasal spray Place 1 spray into both nostrils daily. 16 g 3   Fluticasone-Salmeterol (ADVAIR DISKUS) 100-50 MCG/DOSE AEPB Inhale 1 puff into the lungs 2 (two) times daily. 60 each 11   furosemide (LASIX) 20 MG tablet TAKE 1 TABLET DAILY ON DAY OF CHEMO AND WHILE 5FU INFUSING THEN 1/2 TABLET DAILY 30 tablet 1   gabapentin (NEURONTIN) 100 MG capsule TAKE 1 CAPSULE(100 MG) BY MOUTH THREE TIMES DAILY 90 capsule 1   gabapentin (NEURONTIN) 300 MG capsule Take 1 capsule (300 mg total) by mouth 2 (two) times daily. 60 capsule 3   gabapentin (NEURONTIN) 800 MG tablet Take 800 mg by mouth daily.     glucose blood (ONETOUCH VERIO) test strip Check blood sugar once a day as instructed 100 each 3   HYDROcodone-acetaminophen (NORCO/VICODIN) 5-325 MG tablet Take 1 tablet by mouth 2 (two) times daily as needed for severe pain. 60 tablet 0   ipratropium (ATROVENT) 0.02 % nebulizer solution Take 0.5 mg by nebulization every 6 (six) hours as needed for wheezing or shortness of breath.     latanoprost (XALATAN) 0.005 % ophthalmic solution PLACE 1 DROP INTO BOTH EYES AT BEDTIME  0   loperamide (LOPERAMIDE A-D) 2 MG tablet Take 1 tablet (2 mg total) by mouth 2 (two) times daily as needed for diarrhea or loose stools. 6 tablet 0   loratadine (CLARITIN) 10 MG tablet Take 1 tablet (10 mg total) by mouth daily. 30 tablet 11   magic mouthwash w/lidocaine SOLN Take 5 mLs by mouth 4 (four) times daily as needed for mouth pain. 480 mL 2   meclizine (ANTIVERT) 12.5 MG tablet TAKE 1 TABLET(12.5 MG) BY MOUTH TWICE DAILY AS NEEDED FOR DIZZINESS 15 tablet 3   potassium chloride SA (KLOR-CON M) 20 MEQ tablet  Take 1 tablet (20 mEq total) by mouth daily. 30 tablet 0   prochlorperazine (COMPAZINE) 10 MG tablet TAKE 1 TABLET(10 MG) BY MOUTH EVERY 6 HOURS AS NEEDED FOR NAUSEA OR VOMITING 30 tablet 3   risperiDONE (RISPERDAL) 0.25 MG tablet Take 0.25 mg  by mouth daily as needed.     rivaroxaban (XARELTO) 20 MG TABS tablet Take 1 tablet (20 mg total) by mouth daily with supper. 30 tablet 3   sertraline (ZOLOFT) 50 MG tablet Take 50 mg by mouth daily.     traZODone (DESYREL) 50 MG tablet Take 25-50 mg by mouth at bedtime as needed for sleep.      triamcinolone cream (KENALOG) 0.1 % apply to affected area twice a day 454 g 1   No current facility-administered medications for this visit.    PHYSICAL EXAMINATION: ECOG PERFORMANCE STATUS: 1 - Symptomatic but completely ambulatory  Vitals:   02/02/21 1224  BP: (!) 157/82  Pulse: 76  Resp: 18  Temp: 99 F (37.2 C)  SpO2: 100%   Wt Readings from Last 3 Encounters:  02/02/21 133 lb 4.8 oz (60.5 kg)  01/25/21 138 lb 12.8 oz (63 kg)  01/19/21 135 lb 4 oz (61.3 kg)     GENERAL:alert, no distress and comfortable SKIN: skin color normal, no rashes or significant lesions EYES: normal, Conjunctiva are pink and non-injected, sclera clear  NEURO: alert & oriented x 3 with fluent speech  LABORATORY DATA:  I have reviewed the data as listed CBC Latest Ref Rng & Units 02/02/2021 01/18/2021 12/29/2020  WBC 4.0 - 10.5 K/uL 5.2 4.5 4.8  Hemoglobin 12.0 - 15.0 g/dL 8.5(L) 9.0(L) 8.5(L)  Hematocrit 36.0 - 46.0 % 27.3(L) 28.8(L) 27.2(L)  Platelets 150 - 400 K/uL 174 169 170     CMP Latest Ref Rng & Units 02/02/2021 01/18/2021 12/29/2020  Glucose 70 - 99 mg/dL 171(H) 213(H) 211(H)  BUN 8 - 23 mg/dL 7(L) 7(L) 13  Creatinine 0.44 - 1.00 mg/dL 0.58 0.61 0.76  Sodium 135 - 145 mmol/L 138 139 140  Potassium 3.5 - 5.1 mmol/L 3.9 3.9 4.4  Chloride 98 - 111 mmol/L 105 104 107  CO2 22 - 32 mmol/L _0 Calcium 8.9 - 10.3 mg/dL 8.9 8.7(L) 8.6(L)  Total Protein 6.5  - 8.1 g/dL 6.6 6.7 6.2(L)  Total Bilirubin 0.3 - 1.2 mg/dL 0.4 0.5 0.4  Alkaline Phos 38 - 126 U/L 116 132(H) 124  AST 15 - 41 U/L 25 29 36  ALT 0 - 44 U/L _1 RADIOGRAPHIC STUDIES: I have personally reviewed the radiological images as listed and agreed with the findings in the report. No results found.    Orders Placed This Encounter  Procedures   CT CHEST ABDOMEN PELVIS W CONTRAST    Standing Status:   Future    Standing Expiration Date:   02/02/2022    Order Specific Question:   Preferred imaging location?    Answer:   [facility]    Order Specific Question:   Is Oral Contrast requested for this exam?    Answer:   Yes, Per Radiology protocol   All questions were answered. The patient knows to call the clinic with any problems, questions or concerns. No barriers to learning was detected. The total time spent in the appointment was 30 minutes.     Truitt Merle, MD 02/02/2021   I, Wilburn Mylar, am acting as scribe for Truitt Merle, MD.   I have reviewed the above documentation for accuracy and completeness, and I agree with the above.

## 2021-02-02 NOTE — Patient Instructions (Addendum)
[facility]   Discharge Instructions: Thank you for choosing [facility] to provide your oncology and hematology care.   If you have a lab appointment with the [facility], please go directly to the [facility] and check in at the registration area.   Wear comfortable clothing and clothing appropriate for easy access to any Portacath or PICC line.   We strive to give you quality time with your provider. You may need to reschedule your appointment if you arrive late (15 or more minutes).  Arriving late affects you and other patients whose appointments are after yours.  Also, if you miss three or more appointments without notifying the office, you may be dismissed from the clinic at the providers discretion.      For prescription refill requests, have your pharmacy contact our office and allow 72 hours for refills to be completed.    Today you received the following chemotherapy and/or immunotherapy agents: Oxaliplatin, Leucovorin, and Fluorouracil (Adrucil)      To help prevent nausea and vomiting after your treatment, we encourage you to take your nausea medication as directed.  BELOW ARE SYMPTOMS THAT SHOULD BE REPORTED IMMEDIATELY: *FEVER GREATER THAN 100.4 F (38 C) OR HIGHER *CHILLS OR SWEATING *NAUSEA AND VOMITING THAT IS NOT CONTROLLED WITH YOUR NAUSEA MEDICATION *UNUSUAL SHORTNESS OF BREATH *UNUSUAL BRUISING OR BLEEDING *URINARY PROBLEMS (pain or burning when urinating, or frequent urination) *BOWEL PROBLEMS (unusual diarrhea, constipation, pain near the anus) TENDERNESS IN MOUTH AND THROAT WITH OR WITHOUT PRESENCE OF ULCERS (sore throat, sores in mouth, or a toothache) UNUSUAL RASH, SWELLING OR PAIN  UNUSUAL VAGINAL DISCHARGE OR ITCHING   Items with * indicate a potential emergency and should be followed up as soon as possible or go to the Emergency Department if any problems should occur.  Please show the CHEMOTHERAPY ALERT CARD  or IMMUNOTHERAPY ALERT CARD at check-in to the Emergency Department and triage nurse.  Should you have questions after your visit or need to cancel or reschedule your appointment, please contact [facility]  Dept: [phone]  and follow the prompts.  Office hours are 8:00 a.m. to 4:30 p.m. Monday - Friday. Please note that voicemails left after 4:00 p.m. may not be returned until the following business day.  We are closed weekends and major holidays. You have access to a nurse at all times for urgent questions. Please call the main number to the clinic Dept: [phone] and follow the prompts.   For any non-urgent questions, you may also contact your provider using MyChart. We now offer e-Visits for anyone 29 and older to request care online for non-urgent symptoms. For details visit [email or web address].   Also download the MyChart app! Go to the app store, search "MyChart", open the app, select [facility], and log in with your MyChart username and password.  Due to Covid, a mask is required upon entering the hospital/clinic. If you do not have a mask, one will be given to you upon arrival. For doctor visits, patients may have 1 support person aged 61 or older with them. For treatment visits, patients cannot have anyone with them due to current Covid guidelines and our immunocompromised population.   The chemotherapy medication bag should finish at 46 hours, 96 hours, or 7 days. For example, if your pump is scheduled for 46 hours and it was put on at 4:00 p.m., it should finish at 2:00 p.m. the day it is scheduled to  come off regardless of your appointment time.     Estimated time to finish at Friday, 1/18 @ 2:45 pm .   If the display on your pump reads "Low Volume" and it is beeping, take the batteries out of the pump and come to the cancer center for it to be taken off.   If the pump alarms go off prior to the pump reading "Low Volume" then call [phone]  and someone can assist you.  If the plunger comes out and the chemotherapy medication is leaking out, please use your home chemo spill kit to clean up the spill. Do NOT use paper towels or other household products.  If you have problems or questions regarding your pump, please call either [phone] (24 hours a day) or the cancer center Monday-Friday 8:00 a.m.- 4:30 p.m. at the clinic number and we will assist you. If you are unable to get assistance, then go to the nearest Emergency Department and ask the staff to contact the IV team for assistance.

## 2021-02-02 NOTE — Progress Notes (Signed)
Internal Medicine Clinic Attending  Case discussed with Dr. Carter  At the time of the visit.  We reviewed the resident's history and exam and pertinent patient test results.  I agree with the assessment, diagnosis, and plan of care documented in the resident's note.  

## 2021-02-04 ENCOUNTER — Inpatient Hospital Stay: Payer: Medicare Other

## 2021-02-04 ENCOUNTER — Telehealth: Payer: Self-pay | Admitting: Hematology

## 2021-02-04 ENCOUNTER — Encounter: Payer: Medicare Other | Admitting: Internal Medicine

## 2021-02-04 ENCOUNTER — Other Ambulatory Visit: Payer: Self-pay

## 2021-02-04 VITALS — BP 130/68 | HR 77 | Temp 99.6°F | Resp 20

## 2021-02-04 DIAGNOSIS — Z7189 Other specified counseling: Secondary | ICD-10-CM

## 2021-02-04 DIAGNOSIS — C787 Secondary malignant neoplasm of liver and intrahepatic bile duct: Secondary | ICD-10-CM | POA: Diagnosis not present

## 2021-02-04 DIAGNOSIS — C252 Malignant neoplasm of tail of pancreas: Secondary | ICD-10-CM

## 2021-02-04 DIAGNOSIS — Z5111 Encounter for antineoplastic chemotherapy: Secondary | ICD-10-CM | POA: Diagnosis not present

## 2021-02-04 MED ORDER — SODIUM CHLORIDE 0.9% FLUSH
10.0000 mL | INTRAVENOUS | Status: DC | PRN
  Administered 2021-02-04: 10 mL

## 2021-02-04 MED ORDER — HEPARIN SOD (PORK) LOCK FLUSH 100 UNIT/ML IV SOLN
500.0000 [IU] | Freq: Once | INTRAVENOUS | Status: AC | PRN
  Administered 2021-02-04: 500 [IU]

## 2021-02-04 NOTE — Telephone Encounter (Signed)
Left message with follow-up appointments per 1/18 los. °

## 2021-02-10 DIAGNOSIS — M85852 Other specified disorders of bone density and structure, left thigh: Secondary | ICD-10-CM | POA: Diagnosis not present

## 2021-02-10 DIAGNOSIS — Z780 Asymptomatic menopausal state: Secondary | ICD-10-CM | POA: Diagnosis not present

## 2021-02-10 DIAGNOSIS — M85851 Other specified disorders of bone density and structure, right thigh: Secondary | ICD-10-CM | POA: Diagnosis not present

## 2021-02-10 DIAGNOSIS — Z1231 Encounter for screening mammogram for malignant neoplasm of breast: Secondary | ICD-10-CM | POA: Diagnosis not present

## 2021-02-11 ENCOUNTER — Other Ambulatory Visit: Payer: Self-pay | Admitting: Internal Medicine

## 2021-02-14 ENCOUNTER — Encounter: Payer: Self-pay | Admitting: Hematology

## 2021-02-15 MED FILL — Dexamethasone Sodium Phosphate Inj 100 MG/10ML: INTRAMUSCULAR | Qty: 1 | Status: AC

## 2021-02-16 ENCOUNTER — Other Ambulatory Visit: Payer: Self-pay | Admitting: Hematology

## 2021-02-16 ENCOUNTER — Other Ambulatory Visit: Payer: Self-pay

## 2021-02-16 ENCOUNTER — Encounter: Payer: Self-pay | Admitting: Nurse Practitioner

## 2021-02-16 ENCOUNTER — Inpatient Hospital Stay (HOSPITAL_BASED_OUTPATIENT_CLINIC_OR_DEPARTMENT_OTHER): Payer: Medicare Other | Admitting: Nurse Practitioner

## 2021-02-16 ENCOUNTER — Inpatient Hospital Stay: Payer: Medicare Other

## 2021-02-16 ENCOUNTER — Inpatient Hospital Stay: Payer: Medicare Other | Attending: Nurse Practitioner

## 2021-02-16 ENCOUNTER — Telehealth: Payer: Self-pay | Admitting: Internal Medicine

## 2021-02-16 VITALS — BP 132/79 | HR 82 | Temp 98.4°F | Resp 17 | Wt 130.0 lb

## 2021-02-16 DIAGNOSIS — Z95828 Presence of other vascular implants and grafts: Secondary | ICD-10-CM

## 2021-02-16 DIAGNOSIS — Z5111 Encounter for antineoplastic chemotherapy: Secondary | ICD-10-CM | POA: Diagnosis not present

## 2021-02-16 DIAGNOSIS — N6459 Other signs and symptoms in breast: Secondary | ICD-10-CM | POA: Diagnosis not present

## 2021-02-16 DIAGNOSIS — Z452 Encounter for adjustment and management of vascular access device: Secondary | ICD-10-CM | POA: Insufficient documentation

## 2021-02-16 DIAGNOSIS — C787 Secondary malignant neoplasm of liver and intrahepatic bile duct: Secondary | ICD-10-CM | POA: Diagnosis not present

## 2021-02-16 DIAGNOSIS — C252 Malignant neoplasm of tail of pancreas: Secondary | ICD-10-CM | POA: Diagnosis not present

## 2021-02-16 DIAGNOSIS — Z7189 Other specified counseling: Secondary | ICD-10-CM

## 2021-02-16 LAB — CBC WITH DIFFERENTIAL (CANCER CENTER ONLY)
Abs Immature Granulocytes: 0.01 10*3/uL (ref 0.00–0.07)
Basophils Absolute: 0 10*3/uL (ref 0.0–0.1)
Basophils Relative: 1 %
Eosinophils Absolute: 0.4 10*3/uL (ref 0.0–0.5)
Eosinophils Relative: 8 %
HCT: 28.5 % — ABNORMAL LOW (ref 36.0–46.0)
Hemoglobin: 8.8 g/dL — ABNORMAL LOW (ref 12.0–15.0)
Immature Granulocytes: 0 %
Lymphocytes Relative: 9 %
Lymphs Abs: 0.5 10*3/uL — ABNORMAL LOW (ref 0.7–4.0)
MCH: 24.9 pg — ABNORMAL LOW (ref 26.0–34.0)
MCHC: 30.9 g/dL (ref 30.0–36.0)
MCV: 80.7 fL (ref 80.0–100.0)
Monocytes Absolute: 0.9 10*3/uL (ref 0.1–1.0)
Monocytes Relative: 17 %
Neutro Abs: 3.6 10*3/uL (ref 1.7–7.7)
Neutrophils Relative %: 65 %
Platelet Count: 175 10*3/uL (ref 150–400)
RBC: 3.53 MIL/uL — ABNORMAL LOW (ref 3.87–5.11)
RDW: 22 % — ABNORMAL HIGH (ref 11.5–15.5)
WBC Count: 5.5 10*3/uL (ref 4.0–10.5)
nRBC: 0 % (ref 0.0–0.2)

## 2021-02-16 LAB — CMP (CANCER CENTER ONLY)
ALT: 23 U/L (ref 0–44)
AST: 33 U/L (ref 15–41)
Albumin: 3.3 g/dL — ABNORMAL LOW (ref 3.5–5.0)
Alkaline Phosphatase: 137 U/L — ABNORMAL HIGH (ref 38–126)
Anion gap: 4 — ABNORMAL LOW (ref 5–15)
BUN: 7 mg/dL — ABNORMAL LOW (ref 8–23)
CO2: 30 mmol/L (ref 22–32)
Calcium: 9 mg/dL (ref 8.9–10.3)
Chloride: 103 mmol/L (ref 98–111)
Creatinine: 0.59 mg/dL (ref 0.44–1.00)
GFR, Estimated: >60 mL/min (ref >60)
Glucose, Bld: 219 mg/dL — ABNORMAL HIGH (ref 70–99)
Potassium: 3.9 mmol/L (ref 3.5–5.1)
Sodium: 137 mmol/L (ref 135–145)
Total Bilirubin: 0.4 mg/dL (ref 0.3–1.2)
Total Protein: 6.8 g/dL (ref 6.5–8.1)

## 2021-02-16 MED ORDER — DEXTROSE 5 % IV SOLN: Freq: Once | INTRAVENOUS | Status: AC

## 2021-02-16 MED ORDER — SODIUM CHLORIDE 0.9% FLUSH
10.0000 mL | Freq: Once | INTRAVENOUS | Status: AC
  Administered 2021-02-16: 10 mL

## 2021-02-16 MED ORDER — HYDROCODONE-ACETAMINOPHEN 5-325 MG PO TABS: 1.0000 | ORAL_TABLET | Freq: Two times a day (BID) | ORAL | 0 refills | Status: DC | PRN

## 2021-02-16 MED ORDER — LEUCOVORIN CALCIUM INJECTION 350 MG
400.0000 mg/m2 | Freq: Once | INTRAVENOUS | Status: AC
  Administered 2021-02-16: 660 mg via INTRAVENOUS
  Filled 2021-02-16: qty 33

## 2021-02-16 MED ORDER — SODIUM CHLORIDE 0.9 % IV SOLN
2000.0000 mg/m2 | INTRAVENOUS | Status: DC
  Administered 2021-02-16: 3300 mg via INTRAVENOUS
  Filled 2021-02-16: qty 66

## 2021-02-16 MED ORDER — SODIUM CHLORIDE 0.9 % IV SOLN
10.0000 mg | Freq: Once | INTRAVENOUS | Status: AC
  Administered 2021-02-16: 10 mg via INTRAVENOUS
  Filled 2021-02-16: qty 10

## 2021-02-16 MED ORDER — PALONOSETRON HCL INJECTION 0.25 MG/5ML
0.2500 mg | Freq: Once | INTRAVENOUS | Status: AC
  Administered 2021-02-16: 0.25 mg via INTRAVENOUS
  Filled 2021-02-16: qty 5

## 2021-02-16 MED ORDER — OXALIPLATIN CHEMO INJECTION 100 MG/20ML
70.0000 mg/m2 | Freq: Once | INTRAVENOUS | Status: AC
  Administered 2021-02-16: 115 mg via INTRAVENOUS
  Filled 2021-02-16: qty 20

## 2021-02-16 NOTE — Telephone Encounter (Signed)
Pt requesting call back.  Note have been rec'd from [facility]. Pt states she was instructed to call her PCP in regards to her results as soon as possible.

## 2021-02-16 NOTE — Patient Instructions (Signed)
[facility]  Discharge Instructions: ?Thank you for choosing [facility] to provide your oncology and hematology care.  ? ?If you have a lab appointment with the Cancer Center, please go directly to the Cancer Center and check in at the registration area. ?  ?Wear comfortable clothing and clothing appropriate for easy access to any Portacath or PICC line.  ? ?We strive to give you quality time with your provider. You may need to reschedule your appointment if you arrive late (15 or more minutes).  Arriving late affects you and other patients whose appointments are after yours.  Also, if you miss three or more appointments without notifying the office, you may be dismissed from the clinic at the provider?s discretion.    ?  ?For prescription refill requests, have your pharmacy contact our office and allow 72 hours for refills to be completed.   ? ?Today you received the following chemotherapy and/or immunotherapy agents: Oxaliplatin, Fluorouracil.     ?  ?To help prevent nausea and vomiting after your treatment, we encourage you to take your nausea medication as directed. ? ?BELOW ARE SYMPTOMS THAT SHOULD BE REPORTED IMMEDIATELY: ?*FEVER GREATER THAN 100.4 F (38 ?C) OR HIGHER ?*CHILLS OR SWEATING ?*NAUSEA AND VOMITING THAT IS NOT CONTROLLED WITH YOUR NAUSEA MEDICATION ?*UNUSUAL SHORTNESS OF BREATH ?*UNUSUAL BRUISING OR BLEEDING ?*URINARY PROBLEMS (pain or burning when urinating, or frequent urination) ?*BOWEL PROBLEMS (unusual diarrhea, constipation, pain near the anus) ?TENDERNESS IN MOUTH AND THROAT WITH OR WITHOUT PRESENCE OF ULCERS (sore throat, sores in mouth, or a toothache) ?UNUSUAL RASH, SWELLING OR PAIN  ?UNUSUAL VAGINAL DISCHARGE OR ITCHING  ? ?Items with * indicate a potential emergency and should be followed up as soon as possible or go to the Emergency Department if any problems should occur. ? ?Please show the CHEMOTHERAPY ALERT CARD or IMMUNOTHERAPY ALERT  CARD at check-in to the Emergency Department and triage nurse. ? ?Should you have questions after your visit or need to cancel or reschedule your appointment, please contact [facility]  Dept: [phone]  and follow the prompts.  Office hours are 8:00 a.m. to 4:30 p.m. Monday - Friday. Please note that voicemails left after 4:00 p.m. may not be returned until the following business day.  We are closed weekends and major holidays. You have access to a nurse at all times for urgent questions. Please call the main number to the clinic Dept: [phone] and follow the prompts. ? ? ?For any non-urgent questions, you may also contact your provider using MyChart. We now offer e-Visits for anyone 18 and older to request care online for non-urgent symptoms. For details visit [email or web address]. ?  ?Also download the MyChart app! Go to the app store, search "MyChart", open the app, select , and log in with your MyChart username and password. ? ?Due to Covid, a mask is required upon entering the hospital/clinic. If you do not have a mask, one will be given to you upon arrival. For doctor visits, patients may have 1 support person aged 18 or older with them. For treatment visits, patients cannot have anyone with them due to current Covid guidelines and our immunocompromised population.  ? ?

## 2021-02-16 NOTE — Telephone Encounter (Signed)
RTC to patient.  Message left that Clinics had called.  Call to [facility] to find out what results that patient is requesting from her PCP.  Patient had a Mammogram and a Dexa Scan done on 02/10/2021.  Results to be sent to the Clinics for PCP to review and to call patient with results.

## 2021-02-16 NOTE — Progress Notes (Signed)
[place]   Telephone:([phone]) [phone] Fax:(336) [phone]   Clinic Follow up Note   Patient Care Team: Sid Falcon, MD as PCP - General (Internal Medicine) Forrest Moron, [place] (Optometry) Jonnie Finner, RN (Inactive) as Oncology Nurse Navigator Truitt Merle, MD as Consulting Physician (Oncology) Gwyndolyn Kaufman, RN (Inactive) as Registered Nurse 02/16/2021  CHIEF COMPLAINT: Follow up pancreatic cancer   SUMMARY OF ONCOLOGIC HISTORY: Oncology History Overview Note  Cancer Staging Pancreatic cancer [facility]) Staging form: Exocrine Pancreas, AJCC 8th Edition - Clinical: Stage IV (cT1c, cN0, pM1) - Signed by Truitt Merle, MD on 02/26/2020 Total positive nodes: 0    Pancreatic cancer ([place])  01/13/2020 Imaging   US Abdomen 01/13/20  IMPRESSION: 1. Suggestion of 3 vascular hypoechoic mass within the liver measuring up to 2.5 cm. 2. Suggestion of a hypoechoic mass within the tail of the pancreas that is not well visualized. 3. Findings concerning for malignancy, please see separately dictated CT abdomen pelvis 01/13/2020.   01/13/2020 Imaging   CT AP 01/13/20  IMPRESSION: 1. Hypoenhancing 2.0 cm in long axis mass in the pancreatic tail compatible with pancreatic adenocarcinoma. The mass abuts the posterior gastric wall but without definite gastric invasion. The splenic vein appears narrowed/attenuated compared to prior exams and the lesion abuts the margin of the splenic artery. 2. Hypoenhancing hepatic masses are new compared to 11/22/2017 and are highly suspicious for metastatic lesions. 3. Questionable 0.8 by 0.8 cm omental tumor nodule versus dense diverticulum above the transverse colon. 4. Other imaging findings of potential clinical significance: Mild distal esophageal wall thickening, query esophagitis. Stable left adrenal adenoma. Sigmoid colon diverticulosis. Lumbar and thoracic spondylosis and degenerative disc disease contributing to  multilevel impingement. 5. Aortic atherosclerosis. Aortic Atherosclerosis (ICD10-I70.0).   01/21/2020 Initial Diagnosis   Pancreatic cancer ([place])   01/28/2020 Initial Biopsy   FINAL MICROSCOPIC DIAGNOSIS:   A. LIVER, NEEDLE CORE BIOPSY:  - Adenocarcinoma.   COMMENT:   Immunohistochemistry for CK7 is positive.  CDX-2 demonstrates weak to  moderate positive staining.  TTF-1 and PAX 8 demonstrate weak, likely  nonspecific staining.  CK20, GATA-3 and ER are negative.  The  morphologic and immunophenotypic characteristics are compatible with the  clinical impression of a primary pancreatic cancer.  Radiologic  correlation is encouraged.  If applicable, there is likely sufficient  tissue for ancillary studies (Block A2).  Dr. Vic Ripper reviewed the  case.    02/26/2020 - 05/19/2020 Chemotherapy   First-line Gemcitabine and Abraxane every 2 weeks starting 02/26/20. D/c after 05/19/20 due to disease progression in liver and neuropathy     02/26/2020 Cancer Staging   Staging form: Exocrine Pancreas, AJCC 8th Edition - Clinical: Stage IV (cT1c, cN0, pM1) - Signed by Truitt Merle, MD on 02/26/2020 Total positive nodes: 0    03/08/2020 Genetic Testing   Negative hereditary cancer genetic testing: no pathogenic variants detected in [facility] Common Hereditary Cancers Panel with preliminary evidence pancreatic cancer genes.  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 The report date is March 08, 2020.    The Common Hereditary Cancers Panel with preliminary evidence pancreatic cancer genes offered by [facility] includes sequencing and/or deletion duplication testing of the following 49 genes: APC, ATM, AXIN2, BARD1, BMPR1A, BRCA1, BRCA2, BRIP1, CDH1, CDK4, CDKN2A (p14ARF), CDKN2A (p16INK4a), CHEK2, CTNNA1, DICER1, EPCAM (Deletion/duplication testing only), GREM1 (promoter region deletion/duplication testing only), FANCC, GREM1, HOXB13, KIT, MEN1, MLH1, MSH2, MSH3, MSH6, MUTYH,  NBN, NF1, NHTL1, PALB2, PALLD, PDGFRA, PMS2, POLD1,  POLE, PTEN, RAD50, RAD51C, RAD51D, SDHA, SDHB, SDHC, SDHD, SMAD4, SMARCA4. STK11, TP53, TSC1, TSC2, and VHL.  The following genes were evaluated for sequence changes only: SDHA and HOXB13 c.251G>A variant only.es only: SDHA and HOXB13 c.251G>A variant only.   05/03/2020 Imaging   CT AP  IMPRESSION: 1. Interval decrease in size of a hypodense mass of the central pancreatic tail, consistent with treatment response of primary pancreatic malignancy. 2. Numerous low-attenuation liver lesions are seen, increased in size and number compared to prior examination. Findings are consistent with worsened hepatic metastatic disease. 3. A previously queried omental nodule adjacent to the transverse colon is more clearly a prominent diverticulum on current examination.   Aortic Atherosclerosis (ICD10-I70.0).   06/02/2020 -  Chemotherapy   Second-line FOLFIRI q2weeks starting 06/02/20    12/15/2020 -  Chemotherapy   Patient is on Treatment Plan : PANCREAS FOLFOX q14d       CURRENT THERAPY: Third line FOLFOX q2 weeks, dose reduced oxali and 5FU, started 12/15/20  INTERVAL HISTORY: Ms. Cudd returns for follow up and treatment as scheduled. Last seen by Dr. Burr Medico 02/02/21 and completed cycle 4.  She is doing well, not bothered by cold sensitivity.  Neuropathy is intermittent and stable, she does drop some things but this has not changed since last cycle.  She is steadily losing some weight, appetite fluctuates, denies mucositis.  Denies nausea/vomiting, bowels moving.  Around Thanksgiving she developed pain in her right shoulder that spread down her right arm, at last visit pain was in her back and she was given tramadol.  She takes 1 p.o. at night which does not help much.  Pain stays around a 6 out of 10.  She was on Norco initially when she was diagnosed which was helpful.  Now the pain has moved to her left arm.  Denies weakness.  She feels like there is  a "knot".  She has been resting more.  She also notes having a screening mammogram at [facility] and has been told she needs to return for more imaging.  He has never had an abnormality in the breast or lumpectomy.    MEDICAL HISTORY:  Past Medical History:  Diagnosis Date   Acute medial meniscus tear    right knee   Anxiety    Asthma    Cancer ([place])    Cervical spondylosis    Chronic serous otitis media    Constipation    COPD (chronic obstructive pulmonary disease) (HCC)    Depression    followed by [name]; no longer of depakote, seroquel   Diabetes mellitus without complication ([place])    Dysuria 12/27/2009   Qualifier: Diagnosis of  By: [name] MD, Amanjot     Foot drop    GERD (gastroesophageal reflux disease)    Heart murmur    Hyperlipidemia    Hypertension    Low back pain    Obesity    Paraumbilical hernia    Pre-diabetes     SURGICAL HISTORY: Past Surgical History:  Procedure Laterality Date   ABDOMINAL HYSTERECTOMY     ANTERIOR CERVICAL DECOMP/DISCECTOMY FUSION N/A 10/05/2014   Procedure: ACDF - C5-C6 - C6-C7;  Surgeon: Karie Chimera, MD;  Location: [place] [facility];  Service: Neurosurgery;  Laterality: N/A;  ACDF - C5-C6 - C6-C7   CHOLECYSTECTOMY     COLONOSCOPY     FOOT SURGERY     HERNIA REPAIR  1999   IR IMAGING GUIDED PORT INSERTION  02/25/2020   IR US GUIDANCE  01/28/2020   KNEE ARTHROSCOPY WITH MEDIAL MENISECTOMY Right 06/14/2016   Procedure: RIGHT KNEE ARTHROSCOPY WITH PARTIAL MEDIAL MENISCECTOMY, SUBCHONDROPLASTY;  Surgeon: Leandrew Koyanagi, MD;  Location: [facility];  Service: Orthopedics;  Laterality: Right;   KNEE ARTHROSCOPY WITH SUBCHONDROPLASTY Right 06/14/2016   Procedure: KNEE ARTHROSCOPY WITH SUBCHONDROPLASTY;  Surgeon: Leandrew Koyanagi, MD;  Location: [facility];  Service: Orthopedics;  Laterality: Right;   RADIOACTIVE SEED GUIDED EXCISIONAL BREAST BIOPSY Left 06/23/2014   Procedure: RADIOACTIVE SEED GUIDED EXCISIONAL BREAST  BIOPSY;  Surgeon: Stark Klein, MD;  Location: [facility];  Service: General;  Laterality: Left;    I have reviewed the social history and family history with the patient and they are unchanged from previous note.  ALLERGIES:  has No Known Allergies.  MEDICATIONS:  Current Outpatient Medications  Medication Sig Dispense Refill   alendronate (FOSAMAX) 70 MG tablet TAKE 1 TABLET BY MOUTH  EVERY 7 DAYS WITH A FULL  GLASS OF WATER ON AN EMPTY  STOMACH 12 tablet 3   Accu-Chek Softclix Lancets lancets Check blood sugar once a day as instructed 102 each 3   albuterol (PROVENTIL) (2.5 MG/3ML) 0.083% nebulizer solution Take 3 mLs (2.5 mg total) by nebulization every 6 (six) hours as needed for wheezing. 1080 mL 3   albuterol (VENTOLIN HFA) 108 (90 Base) MCG/ACT inhaler INHALE 1 TO 2 PUFFS INTO THE LUNGS EVERY 6 HOURS AS NEEDED FOR WHEEZING OR SHORTNESS OF BREATH 54 g 1   atorvastatin (LIPITOR) 40 MG tablet TAKE 1 TABLET BY MOUTH EVERY DAY 90 tablet 3   benazepril (LOTENSIN) 20 MG tablet Take 1 tablet (20 mg total) by mouth daily. 90 tablet 3   Blood Glucose Monitoring Suppl (ONETOUCH VERIO REFLECT) w/Device KIT Check blood sugar once a day as instructed 1 kit 0   celecoxib (CELEBREX) 100 MG capsule TAKE 1 CAPSULE BY MOUTH  TWICE DAILY 180 capsule 3   cholecalciferol (VITAMIN D) 1000 units tablet Take 2,000 Units by mouth daily.     diclofenac Sodium (VOLTAREN) 1 % GEL Apply 2 g topically 4 (four) times daily. 100 g 2   diphenoxylate-atropine (LOMOTIL) 2.5-0.025 MG tablet Take 1 tablet by mouth 4 (four) times daily as needed for diarrhea or loose stools. 30 tablet 0   famotidine (PEPCID) 40 MG tablet TAKE 1/2 TABLET(20 MG) BY MOUTH TWICE DAILY 30 tablet 3   fluticasone (FLONASE) 50 MCG/ACT nasal spray Place 1 spray into both nostrils daily. 16 g 3   Fluticasone-Salmeterol (ADVAIR DISKUS) 100-50 MCG/DOSE AEPB Inhale 1 puff into the lungs 2 (two) times daily. 60 each 11   furosemide  (LASIX) 20 MG tablet TAKE 1 TABLET DAILY ON DAY OF CHEMO AND WHILE 5FU INFUSING THEN 1/2 TABLET DAILY 30 tablet 1   gabapentin (NEURONTIN) 100 MG capsule TAKE 1 CAPSULE(100 MG) BY MOUTH THREE TIMES DAILY 90 capsule 1   gabapentin (NEURONTIN) 300 MG capsule TAKE 1 CAPSULE(300 MG) BY MOUTH TWICE DAILY 60 capsule 3   gabapentin (NEURONTIN) 800 MG tablet Take 800 mg by mouth daily.     glucose blood (ONETOUCH VERIO) test strip Check blood sugar once a day as instructed 100 each 3   HYDROcodone-acetaminophen (NORCO/VICODIN) 5-325 MG tablet Take 1 tablet by mouth 2 (two) times daily as needed for severe pain. 60 tablet 0   ipratropium (ATROVENT) 0.02 % nebulizer solution Take 0.5 mg by nebulization every 6 (six) hours as needed for wheezing or shortness of breath.  latanoprost (XALATAN) 0.005 % ophthalmic solution PLACE 1 DROP INTO BOTH EYES AT BEDTIME  0   loperamide (LOPERAMIDE A-D) 2 MG tablet Take 1 tablet (2 mg total) by mouth 2 (two) times daily as needed for diarrhea or loose stools. 6 tablet 0   loratadine (CLARITIN) 10 MG tablet Take 1 tablet (10 mg total) by mouth daily. 30 tablet 11   magic mouthwash w/lidocaine SOLN Take 5 mLs by mouth 4 (four) times daily as needed for mouth pain. 480 mL 2   meclizine (ANTIVERT) 12.5 MG tablet TAKE 1 TABLET(12.5 MG) BY MOUTH TWICE DAILY AS NEEDED FOR DIZZINESS 15 tablet 3   potassium chloride SA (KLOR-CON M) 20 MEQ tablet Take 1 tablet (20 mEq total) by mouth daily. 30 tablet 0   prochlorperazine (COMPAZINE) 10 MG tablet TAKE 1 TABLET(10 MG) BY MOUTH EVERY 6 HOURS AS NEEDED FOR NAUSEA OR VOMITING 30 tablet 3   risperiDONE (RISPERDAL) 0.25 MG tablet Take 0.25 mg by mouth daily as needed.     rivaroxaban (XARELTO) 20 MG TABS tablet Take 1 tablet (20 mg total) by mouth daily with supper. 30 tablet 3   sertraline (ZOLOFT) 50 MG tablet Take 50 mg by mouth daily.     traMADol (ULTRAM) 50 MG tablet Take 1 tablet (50 mg total) by mouth every 8 (eight) hours as  needed. 30 tablet 0   traZODone (DESYREL) 50 MG tablet Take 25-50 mg by mouth at bedtime as needed for sleep.      triamcinolone cream (KENALOG) 0.1 % apply to affected area twice a day 454 g 1   No current facility-administered medications for this visit.   Facility-Administered Medications Ordered in Other Visits  Medication Dose Route Frequency Provider Last Rate Last Admin   fluorouracil (ADRUCIL) 3,300 mg in sodium chloride 0.9 % 84 mL chemo infusion  2,000 mg/m2 (Treatment Plan Recorded) Intravenous 1 day or 1 dose Truitt Merle, MD       leucovorin 660 mg in dextrose 5 % 250 mL infusion  400 mg/m2 (Treatment Plan Recorded) Intravenous Once Truitt Merle, MD 142 mL/hr at 02/16/21 1203 660 mg at 02/16/21 1203   oxaliplatin (ELOXATIN) 115 mg in dextrose 5 % 500 mL chemo infusion  70 mg/m2 (Treatment Plan Recorded) Intravenous Once Truitt Merle, MD 262 mL/hr at 02/16/21 1200 115 mg at 02/16/21 1200    PHYSICAL EXAMINATION: ECOG PERFORMANCE STATUS: 1 - Symptomatic but completely ambulatory  Vitals:   02/16/21 1009  BP: 132/79  Pulse: 82  Resp: 17  Temp: 98.4 F (36.9 C)  SpO2: 100%   Filed Weights   02/16/21 1009  Weight: 130 lb 0.5 oz (59 kg)    GENERAL:alert, no distress and comfortable SKIN: No rash EYES: sclera clear NECK: Without mass LYMPH:  no palpable cervical, supraclavicular, or axillary lymphadenopathy LUNGS:  normal breathing effort HEART: regular rate & rhythm and no murmurs, mild lower extremity edema ABDOMEN:abdomen soft, non-tender and normal bowel sounds Musculoskeletal: No focal spinal tenderness.  Focal tenderness at the left superior humerus and fullness near the joint on posterior/high back NEURO: alert & oriented x 3 with fluent speech, no focal motor deficits Breast exam: Pendulous breasts are symmetrical without nipple discharge or inversion.  Palpable small subcutaneous nodules of bilateral breasts, right breast 9:00 and 11:00, left breast 12:00 positions.   PAC without erythema  LABORATORY DATA:  I have reviewed the data as listed CBC Latest Ref Rng & Units 02/16/2021 02/02/2021 01/18/2021  WBC 4.0 - 10.5 K/uL  5.5 5.2 4.5  Hemoglobin 12.0 - 15.0 g/dL 8.8(L) 8.5(L) 9.0(L)  Hematocrit 36.0 - 46.0 % 28.5(L) 27.3(L) 28.8(L)  Platelets 150 - 400 K/uL 175 174 169     CMP Latest Ref Rng & Units 02/16/2021 02/02/2021 01/18/2021  Glucose 70 - 99 mg/dL 219(H) 171(H) 213(H)  BUN 8 - 23 mg/dL 7(L) 7(L) 7(L)  Creatinine 0.44 - 1.00 mg/dL 0.59 0.58 0.61  Sodium 135 - 145 mmol/L 137 138 139  Potassium 3.5 - 5.1 mmol/L 3.9 3.9 3.9  Chloride 98 - 111 mmol/L 103 105 104  CO2 22 - 32 mmol/L _0 Calcium 8.9 - 10.3 mg/dL 9.0 8.9 8.7(L)  Total Protein 6.5 - 8.1 g/dL 6.8 6.6 6.7  Total Bilirubin 0.3 - 1.2 mg/dL 0.4 0.4 0.5  Alkaline Phos 38 - 126 U/L 137(H) 116 132(H)  AST 15 - 41 U/L 33 25 29  ALT 0 - 44 U/L _1 RADIOGRAPHIC STUDIES: I have personally reviewed the radiological images as listed and agreed with the findings in the report. No results found.   ASSESSMENT & PLAN: Celesta Funderburk is a 71 y.o. female with   1. Pancreatic cancer with Liver metastasis, stage IV  -Her CT AP from 01/13/20 showed 2cm mass on tail of pancreas abutting but not invading the gastric wall, with likely liver and omental metastasis.  -Her liver biopsy from 01/28/20 showed adenocarcinoma from pancreatic primary. Her 02/11/20 CT Chest showed no metastasis  -She progressed on first-line gemcitabine/ Abraxane and second line liposomal irinotecan/ 5FU  -She switched to third line FOLFOX starting 12/15/2020, with dose reduced oxaliplatin for neuropathy. -Ms. Favaro appears stable.  She has completed 4 cycles of dose reduced FOLFOX, tolerating well overall.  -She has pain in both shoulders, left upper arm, and back, not adequately managed on tramadol p.o. once daily at night.  We discussed increasing dose/frequency of tramadol vs changing to [place] which has helped her  in the past.  She prefers Norco, I prescribed today -She has lost a total of 10 pounds on this regimen, I encouraged her to use supplements -The overall clinical picture is concerning for cancer progression, she is being scheduled for restaging scan following this cycle -She also has superficial subcutaneous nodules on both breasts. A diagnostic mammo/[facility] at [facility] is pending. We discussed possible etiologies. Given her other symptoms, I am concerned this may be subcutaneous metastasis from pancreatic cancer, although this is not common.  -F/up diag mammo/US, I will request the report.  -Labs reviewed, adequate to proceed with cycle 5 FOLFOX today as planned, same doses -She will proceed with restaging, then follow-up in 2 weeks.  2. Abnormal breast exam, lateral subcutaneous nodules -Pt reports having a screening mammogram recently at [facility] that was abnormal, a diagnostic mammo/ultrasound is pending -I performed a breast exam today per her request, I palpated a total of 3 small subcutaneous nodules, 2 in the right breast at 9:00 and 11:00, and 1 in the superior left breast 12:00.  -All this could be benign, I am concerned for subcutaneous metastasis, although not common and pancreatic cancer -I encouraged her to proceed with the recommended imaging -We have requested the report from [facility]   3. Abdominal Pain, low appetite, 10 lbs weight loss, Transmanitis -She had abdominal pain for the month prior to cancer diagnosis. She is on Norco 5-336m BID as needed. her pain has improved since chemo  -She had sudden loss of appetite and weight  loss since October 2021. -On Chemo her appetite has increased, gaining weight and eating more. -now losing weight on FOLFOX 141 - 130 lbs   4. Comorbidities: Asthma, Borderline Diabetic, GERD, Heart murmur, HLD, HTN, arthritis, neuropathy  -On medications, including multiple diuretics. -Managed by PCP.   -compression stockings for leg edema  -she had  pre-existing neuropathy prior to treatment, worsened on chemo -referred to Dr. [name], improved on higher dose gabapentin    -tuning fork exam 12/15/20 shows moderately decreased vibratory sense over the fingertips L>R -We will monitor closely on oxaliplatin, stable on dose reduced to 70 mg/m2 and gabapentin    5. Social support -She lives alone in 1 level house and she is retired. She has 1 adult son who lives in [place]. She has a twin and 3 other sisters who live in [place], and other siblings and relative in [place].  -Her son will help her at home  -She has approved for grant to help with medication. Her sister will notify us of the type of grant.    6. Cytopenias -secondary to chemotherapy   -stable.    7. Goal of care discussion -The patient understands the goal of care is palliative. -currently full code    8. Hypokalemia secondary to diuretic -She is on Lasix and dieretics for her LE edema and heart.  -Doppler 05/19/20 negative for DVT -LE edema fluctuates, increased now. Restart lasix 20 mg po once daily on days 1-3 of chemo, then 10 mg daily    9. Genetic testing from 02/19/20 was negative for pathogenetic mutation with VUS of gene NF1  Plan: -Labs reviewed -Proceed with cycle 5 FOLFOX today as planned, same doses -Restaging CT CAP after this cycle -Request mammogram report from [facility], proceed with recommended follow-up imaging likely to include diagnostic mammo/ultrasound -Follow-up in 2 weeks -Rx: Norco -Case discussed with Dr. Burr Medico  All questions were answered. The patient knows to call the clinic with any problems, questions or concerns. No barriers to learning was detected. I spent 25 minutes counseling the patient face to face. The total time spent in the appointment was 40 minutes and more than 50% was on counseling and review of test results     Alla Feeling, NP 02/16/21

## 2021-02-17 ENCOUNTER — Telehealth: Payer: Self-pay | Admitting: Internal Medicine

## 2021-02-17 ENCOUNTER — Telehealth: Payer: Self-pay | Admitting: Nurse Practitioner

## 2021-02-17 NOTE — Telephone Encounter (Signed)
Mariah Henderson returned my call.  Confirmed her identity with birth day and name.    She understands that she needs to return to get further scanning of her breasts after the results of her Mammogram.    Her DEXA scan was also reviewed.    She requested a reminder of which BP medication to stop and we reviewed that she should stop the HCTZ due to low potassium.   All questions answered.   Gilles Chiquito, MD

## 2021-02-17 NOTE — Telephone Encounter (Signed)
Scheduled follow-up appointments per 2/1 los. Patient is aware.

## 2021-02-17 NOTE — Telephone Encounter (Signed)
Attempted to call patient on 02/16/21. No answer.  Left hipaa compliant message to call the clinic back.

## 2021-02-18 ENCOUNTER — Other Ambulatory Visit: Payer: Self-pay

## 2021-02-18 ENCOUNTER — Inpatient Hospital Stay: Payer: Medicare Other

## 2021-02-18 DIAGNOSIS — C252 Malignant neoplasm of tail of pancreas: Secondary | ICD-10-CM | POA: Diagnosis not present

## 2021-02-18 DIAGNOSIS — Z452 Encounter for adjustment and management of vascular access device: Secondary | ICD-10-CM | POA: Diagnosis not present

## 2021-02-18 DIAGNOSIS — Z5111 Encounter for antineoplastic chemotherapy: Secondary | ICD-10-CM | POA: Diagnosis not present

## 2021-02-18 DIAGNOSIS — C787 Secondary malignant neoplasm of liver and intrahepatic bile duct: Secondary | ICD-10-CM | POA: Diagnosis not present

## 2021-02-18 DIAGNOSIS — Z95828 Presence of other vascular implants and grafts: Secondary | ICD-10-CM

## 2021-02-18 MED ORDER — SODIUM CHLORIDE 0.9% FLUSH
10.0000 mL | Freq: Once | INTRAVENOUS | Status: AC
  Administered 2021-02-18: 10 mL

## 2021-02-18 MED ORDER — HEPARIN SOD (PORK) LOCK FLUSH 100 UNIT/ML IV SOLN
500.0000 [IU] | Freq: Once | INTRAVENOUS | Status: AC
  Administered 2021-02-18: 500 [IU]

## 2021-02-21 ENCOUNTER — Encounter: Payer: Self-pay | Admitting: Hematology

## 2021-02-21 NOTE — Progress Notes (Signed)
Called pt to discuss copay assistance for 2023.  I left a msg requesting she return my call if she's interested in applying for copay assistance w/ the [place].

## 2021-02-22 ENCOUNTER — Encounter: Payer: Self-pay | Admitting: Hematology

## 2021-02-22 NOTE — Progress Notes (Signed)
Pt returned my call and would like to reapply for copay assistance for 2023 so I applied to the [place] and she was approved for $4,500 from 02/22/21 to 02/22/22 for Eloxatin, Leucovorin and Adrucil.

## 2021-02-24 ENCOUNTER — Other Ambulatory Visit: Payer: Self-pay | Admitting: Student

## 2021-02-24 DIAGNOSIS — R600 Localized edema: Secondary | ICD-10-CM

## 2021-02-28 ENCOUNTER — Other Ambulatory Visit: Payer: Self-pay

## 2021-02-28 ENCOUNTER — Encounter (HOSPITAL_COMMUNITY): Payer: Self-pay

## 2021-02-28 ENCOUNTER — Ambulatory Visit (HOSPITAL_COMMUNITY)
Admission: RE | Admit: 2021-02-28 | Discharge: 2021-02-28 | Disposition: A | Discharge status: Home / Self Care | Payer: Medicare Other | Source: Ambulatory Visit | Attending: Hematology | Admitting: Hematology

## 2021-02-28 DIAGNOSIS — C252 Malignant neoplasm of tail of pancreas: Secondary | ICD-10-CM | POA: Diagnosis not present

## 2021-02-28 DIAGNOSIS — K8689 Other specified diseases of pancreas: Secondary | ICD-10-CM | POA: Diagnosis not present

## 2021-02-28 DIAGNOSIS — C787 Secondary malignant neoplasm of liver and intrahepatic bile duct: Secondary | ICD-10-CM | POA: Diagnosis not present

## 2021-02-28 DIAGNOSIS — D3500 Benign neoplasm of unspecified adrenal gland: Secondary | ICD-10-CM | POA: Diagnosis not present

## 2021-02-28 DIAGNOSIS — M19011 Primary osteoarthritis, right shoulder: Secondary | ICD-10-CM | POA: Diagnosis not present

## 2021-02-28 DIAGNOSIS — J439 Emphysema, unspecified: Secondary | ICD-10-CM | POA: Diagnosis not present

## 2021-02-28 DIAGNOSIS — I2510 Atherosclerotic heart disease of native coronary artery without angina pectoris: Secondary | ICD-10-CM | POA: Diagnosis not present

## 2021-02-28 DIAGNOSIS — K5730 Diverticulosis of large intestine without perforation or abscess without bleeding: Secondary | ICD-10-CM | POA: Diagnosis not present

## 2021-02-28 DIAGNOSIS — M47814 Spondylosis without myelopathy or radiculopathy, thoracic region: Secondary | ICD-10-CM | POA: Diagnosis not present

## 2021-02-28 MED ORDER — IOHEXOL 300 MG/ML  SOLN
100.0000 mL | Freq: Once | INTRAMUSCULAR | Status: AC | PRN
  Administered 2021-02-28: 100 mL via INTRAVENOUS

## 2021-02-28 MED ORDER — HEPARIN SOD (PORK) LOCK FLUSH 100 UNIT/ML IV SOLN
500.0000 [IU] | Freq: Once | INTRAVENOUS | Status: AC
  Administered 2021-02-28: 500 [IU] via INTRAVENOUS

## 2021-02-28 MED ORDER — SODIUM CHLORIDE (PF) 0.9 % IJ SOLN
INTRAMUSCULAR | Status: AC
  Filled 2021-02-28: qty 50

## 2021-03-01 ENCOUNTER — Other Ambulatory Visit: Payer: Self-pay | Admitting: Hematology

## 2021-03-01 MED FILL — Dexamethasone Sodium Phosphate Inj 100 MG/10ML: INTRAMUSCULAR | Qty: 1 | Status: AC

## 2021-03-01 NOTE — Progress Notes (Signed)
DISCONTINUE ON PATHWAY REGIMEN - Pancreatic Adenocarcinoma     A cycle is every 14 days:     Oxaliplatin      Leucovorin      Fluorouracil      Fluorouracil   **Always confirm dose/schedule in your pharmacy ordering system**  REASON: Disease Progression PRIOR TREATMENT: PANOS39: FOLFOX q14 Days Until Progression or Toxicity TREATMENT RESPONSE: Progressive Disease (PD)  START ON PATHWAY REGIMEN - Pancreatic Adenocarcinoma     A cycle is every 14 days:     Liposomal irinotecan      Leucovorin      Fluorouracil   **Always confirm dose/schedule in your pharmacy ordering system**  Patient Characteristics: Metastatic Disease, Second Line, MSS/pMMR or MSI Unknown, Gemcitabine-Based Therapy First Line Therapeutic Status: Metastatic Disease Line of Therapy: Second Line Microsatellite/Mismatch Repair Status: MSS/pMMR Intent of Therapy: Non-Curative / Palliative Intent, Discussed with Patient

## 2021-03-02 ENCOUNTER — Inpatient Hospital Stay: Payer: Medicare Other

## 2021-03-02 ENCOUNTER — Telehealth: Payer: Self-pay

## 2021-03-02 ENCOUNTER — Inpatient Hospital Stay (HOSPITAL_BASED_OUTPATIENT_CLINIC_OR_DEPARTMENT_OTHER): Payer: Medicare Other | Admitting: Nurse Practitioner

## 2021-03-02 ENCOUNTER — Other Ambulatory Visit: Payer: Self-pay

## 2021-03-02 ENCOUNTER — Encounter: Payer: Self-pay | Admitting: Nurse Practitioner

## 2021-03-02 VITALS — BP 158/86 | HR 107 | Temp 97.8°F | Resp 19 | Ht 60.0 in | Wt 126.7 lb

## 2021-03-02 DIAGNOSIS — Z5111 Encounter for antineoplastic chemotherapy: Secondary | ICD-10-CM | POA: Diagnosis not present

## 2021-03-02 DIAGNOSIS — C787 Secondary malignant neoplasm of liver and intrahepatic bile duct: Secondary | ICD-10-CM | POA: Diagnosis not present

## 2021-03-02 DIAGNOSIS — C252 Malignant neoplasm of tail of pancreas: Secondary | ICD-10-CM | POA: Diagnosis not present

## 2021-03-02 DIAGNOSIS — Z95828 Presence of other vascular implants and grafts: Secondary | ICD-10-CM | POA: Diagnosis not present

## 2021-03-02 DIAGNOSIS — Z452 Encounter for adjustment and management of vascular access device: Secondary | ICD-10-CM | POA: Diagnosis not present

## 2021-03-02 LAB — CMP (CANCER CENTER ONLY)
ALT: 24 U/L (ref 0–44)
AST: 33 U/L (ref 15–41)
Albumin: 3.1 g/dL — ABNORMAL LOW (ref 3.5–5.0)
Alkaline Phosphatase: 147 U/L — ABNORMAL HIGH (ref 38–126)
Anion gap: 6 (ref 5–15)
BUN: 7 mg/dL — ABNORMAL LOW (ref 8–23)
CO2: 28 mmol/L (ref 22–32)
Calcium: 8.7 mg/dL — ABNORMAL LOW (ref 8.9–10.3)
Chloride: 103 mmol/L (ref 98–111)
Creatinine: 0.61 mg/dL (ref 0.44–1.00)
GFR, Estimated: >60 mL/min (ref >60)
Glucose, Bld: 269 mg/dL — ABNORMAL HIGH (ref 70–99)
Potassium: 3.7 mmol/L (ref 3.5–5.1)
Sodium: 137 mmol/L (ref 135–145)
Total Bilirubin: 0.4 mg/dL (ref 0.3–1.2)
Total Protein: 6.8 g/dL (ref 6.5–8.1)

## 2021-03-02 LAB — CBC WITH DIFFERENTIAL (CANCER CENTER ONLY)
Abs Immature Granulocytes: 0.02 10*3/uL (ref 0.00–0.07)
Basophils Absolute: 0 10*3/uL (ref 0.0–0.1)
Basophils Relative: 1 %
Eosinophils Absolute: 0.5 10*3/uL (ref 0.0–0.5)
Eosinophils Relative: 7 %
HCT: 30.4 % — ABNORMAL LOW (ref 36.0–46.0)
Hemoglobin: 9.2 g/dL — ABNORMAL LOW (ref 12.0–15.0)
Immature Granulocytes: 0 %
Lymphocytes Relative: 6 %
Lymphs Abs: 0.4 10*3/uL — ABNORMAL LOW (ref 0.7–4.0)
MCH: 23.7 pg — ABNORMAL LOW (ref 26.0–34.0)
MCHC: 30.3 g/dL (ref 30.0–36.0)
MCV: 78.1 fL — ABNORMAL LOW (ref 80.0–100.0)
Monocytes Absolute: 1.1 10*3/uL — ABNORMAL HIGH (ref 0.1–1.0)
Monocytes Relative: 15 %
Neutro Abs: 5.2 10*3/uL (ref 1.7–7.7)
Neutrophils Relative %: 71 %
Platelet Count: 189 10*3/uL (ref 150–400)
RBC: 3.89 MIL/uL (ref 3.87–5.11)
RDW: 23.6 % — ABNORMAL HIGH (ref 11.5–15.5)
WBC Count: 7.3 10*3/uL (ref 4.0–10.5)
nRBC: 0 % (ref 0.0–0.2)

## 2021-03-02 MED ORDER — SODIUM CHLORIDE 0.9% FLUSH
10.0000 mL | INTRAVENOUS | Status: DC | PRN
  Administered 2021-03-02: 10 mL via INTRAVENOUS

## 2021-03-02 MED ORDER — SODIUM CHLORIDE 0.9% FLUSH
10.0000 mL | Freq: Once | INTRAVENOUS | Status: AC
  Administered 2021-03-02: 10 mL

## 2021-03-02 MED ORDER — HEPARIN SOD (PORK) LOCK FLUSH 100 UNIT/ML IV SOLN
500.0000 [IU] | Freq: Once | INTRAVENOUS | Status: AC
  Administered 2021-03-02: 500 [IU] via INTRAVENOUS

## 2021-03-02 NOTE — Telephone Encounter (Signed)
This nurse reached out to patient and was able to speak with niece related to rescheduling postponed infusion from today.  Offered Wednesday 03/09/21 labs at 12 noon and 1pm.  The niece stated that date and time will work just fine.  No further questions or concerns at this time.

## 2021-03-02 NOTE — Progress Notes (Addendum)
Mariah Henderson   [phone] [phone]   Clinic Follow up Note   Patient Care Team: Sid Falcon, MD as PCP - General (Internal Medicine) Forrest Moron, [place] (Optometry) Jonnie Finner, RN (Inactive) as Oncology Nurse Navigator Truitt Merle, MD as Consulting Physician (Oncology) Gwyndolyn Kaufman, RN (Inactive) as Registered Nurse 03/02/2021  CHIEF COMPLAINT: Follow up metastatic pancreas cancer, to liver   SUMMARY OF ONCOLOGIC HISTORY: Oncology History Overview Note  Cancer Staging Pancreatic cancer [facility]) Staging form: Exocrine Pancreas, AJCC 8th Edition - Clinical: Stage IV (cT1c, cN0, pM1) - Signed by Truitt Merle, MD on 02/26/2020 Total positive nodes: 0    Pancreatic cancer ([place])  01/13/2020 Imaging   US Abdomen 01/13/20  IMPRESSION: 1. Suggestion of 3 vascular hypoechoic mass within the liver measuring up to 2.5 cm. 2. Suggestion of a hypoechoic mass within the tail of the pancreas that is not well visualized. 3. Findings concerning for malignancy, please see separately dictated CT abdomen pelvis 01/13/2020.   01/13/2020 Imaging   CT AP 01/13/20  IMPRESSION: 1. Hypoenhancing 2.0 cm in long axis mass in the pancreatic tail compatible with pancreatic adenocarcinoma. The mass abuts the posterior gastric wall but without definite gastric invasion. The splenic vein appears narrowed/attenuated compared to prior exams and the lesion abuts the margin of the splenic artery. 2. Hypoenhancing hepatic masses are new compared to 11/22/2017 and are highly suspicious for metastatic lesions. 3. Questionable 0.8 by 0.8 cm omental tumor nodule versus dense diverticulum above the transverse colon. 4. Other imaging findings of potential clinical significance: Mild distal esophageal wall thickening, query esophagitis. Stable left adrenal adenoma. Sigmoid colon diverticulosis. Lumbar and thoracic spondylosis and degenerative disc disease  contributing to multilevel impingement. 5. Aortic atherosclerosis. Aortic Atherosclerosis (ICD10-I70.0).   01/21/2020 Initial Diagnosis   Pancreatic cancer ([place])   01/28/2020 Initial Biopsy   FINAL MICROSCOPIC DIAGNOSIS:   A. LIVER, NEEDLE CORE BIOPSY:  - Adenocarcinoma.   COMMENT:   Immunohistochemistry for CK7 is positive.  CDX-2 demonstrates weak to  moderate positive staining.  TTF-1 and PAX 8 demonstrate weak, likely  nonspecific staining.  CK20, GATA-3 and ER are negative.  The  morphologic and immunophenotypic characteristics are compatible with the  clinical impression of a primary pancreatic cancer.  Radiologic  correlation is encouraged.  If applicable, there is likely sufficient  tissue for ancillary studies (Block A2).  Dr. Vic Ripper reviewed the  case.    02/26/2020 - 05/19/2020 Chemotherapy   First-line Gemcitabine and Abraxane every 2 weeks starting 02/26/20. D/c after 05/19/20 due to disease progression in liver and neuropathy     02/26/2020 Cancer Staging   Staging form: Exocrine Pancreas, AJCC 8th Edition - Clinical: Stage IV (cT1c, cN0, pM1) - Signed by Truitt Merle, MD on 02/26/2020 Total positive nodes: 0    03/08/2020 Genetic Testing   Negative hereditary cancer genetic testing: no pathogenic variants detected in [facility] Common Hereditary Cancers Panel with preliminary evidence pancreatic cancer genes.  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 The report date is March 08, 2020.    The Common Hereditary Cancers Panel with preliminary evidence pancreatic cancer genes offered by [facility] includes sequencing and/or deletion duplication testing of the following 49 genes: APC, ATM, AXIN2, BARD1, BMPR1A, BRCA1, BRCA2, BRIP1, CDH1, CDK4, CDKN2A (p14ARF), CDKN2A (p16INK4a), CHEK2, CTNNA1, DICER1, EPCAM (Deletion/duplication testing only), GREM1 (promoter region deletion/duplication testing only), FANCC, GREM1, HOXB13, KIT, MEN1, MLH1, MSH2,  MSH3, MSH6, MUTYH, NBN, NF1, NHTL1, PALB2,  PALLD, PDGFRA, PMS2, POLD1, POLE, PTEN, RAD50, RAD51C, RAD51D, SDHA, SDHB, SDHC, SDHD, SMAD4, SMARCA4. STK11, TP53, TSC1, TSC2, and VHL.  The following genes were evaluated for sequence changes only: SDHA and HOXB13 c.251G>A variant only.es only: SDHA and HOXB13 c.251G>A variant only.   05/03/2020 Imaging   CT AP  IMPRESSION: 1. Interval decrease in size of a hypodense mass of the central pancreatic tail, consistent with treatment response of primary pancreatic malignancy. 2. Numerous low-attenuation liver lesions are seen, increased in size and number compared to prior examination. Findings are consistent with worsened hepatic metastatic disease. 3. A previously queried omental nodule adjacent to the transverse colon is more clearly a prominent diverticulum on current examination.   Aortic Atherosclerosis (ICD10-I70.0).   06/02/2020 -  Chemotherapy   Second-line FOLFIRI q2weeks starting 06/02/20    12/15/2020 - 02/18/2021 Chemotherapy   Patient is on Treatment Plan : PANCREAS FOLFOX q14d     02/28/2021 Imaging   CT CAP IMPRESSION: 1. Progressive metastatic disease, with substantial enlargement of the multilobular dominant hepatic metastatic lesion, as well as scattered pulmonary nodules which are increasing in size. 2. Other imaging findings of potential clinical significance: Aortic Atherosclerosis (ICD10-I70.0). Coronary atherosclerosis. Peripheral atherosclerosis. Fatty prominence of the interatrial septum. Possible esophagitis. Emphysema (ICD10-J43.9). Left adrenal adenoma. Atrophic pancreatic tail. Sigmoid colon diverticulosis. Mild multilevel lumbar impingement. No discrete splenic mass today.   03/02/2021 -  Chemotherapy   Patient is on Treatment Plan : PANCREAS Liposomal Irinotecan / Leucovorin / 5-FU  q14d       CURRENT THERAPY: Third line FOLFOX q2 weeks, dose reduced oxali and 5FU, started 12/15/20  INTERVAL HISTORY: Mariah Henderson  returns for follow up and treatment as scheduled last seen by me 02/16/21 and completed cycle 5 FOLFOX. She underwent restaging CT CAP this week.  She feels pretty good in general, continues to note right upper quadrant discomfort that radiates to her back.  Pain is managed with tramadol during the day and Norco at night.  Eating moderately well with low appetite, staying hydrated.  Denies nausea/vomiting.  Bowels moving well.  Denies cough, chest pain, dyspnea or other new specific complaints.  All other systems were reviewed with the patient and are negative.  MEDICAL HISTORY:  Past Medical History:  Diagnosis Date   Acute medial meniscus tear    right knee   Anxiety    Asthma    Cervical spondylosis    Chronic serous otitis media    Constipation    COPD (chronic obstructive pulmonary disease) (HCC)    Depression    followed by [name]; no longer of depakote, seroquel   Diabetes mellitus without complication ([place])    Dysuria 12/27/2009   Qualifier: Diagnosis of  By: [name] MD, Amanjot     Foot drop    GERD (gastroesophageal reflux disease)    Heart murmur    Hyperlipidemia    Hypertension    Low back pain    Obesity    pancreatic ca with liver mets 71/2458   Paraumbilical hernia    Pre-diabetes     SURGICAL HISTORY: Past Surgical History:  Procedure Laterality Date   ABDOMINAL HYSTERECTOMY     ANTERIOR CERVICAL DECOMP/DISCECTOMY FUSION N/A 10/05/2014   Procedure: ACDF - C5-C6 - C6-C7;  Surgeon: Karie Chimera, MD;  Location: [facility];  Service: Neurosurgery;  Laterality: N/A;  ACDF - C5-C6 - C6-C7   CHOLECYSTECTOMY     COLONOSCOPY     FOOT SURGERY     HERNIA  REPAIR  1999   IR IMAGING GUIDED PORT INSERTION  02/25/2020   IR US GUIDANCE  01/28/2020   KNEE ARTHROSCOPY WITH MEDIAL MENISECTOMY Right 06/14/2016   Procedure: RIGHT KNEE ARTHROSCOPY WITH PARTIAL MEDIAL MENISCECTOMY, SUBCHONDROPLASTY;  Surgeon: Leandrew Koyanagi, MD;  Location: [place];  Service:  Orthopedics;  Laterality: Right;   KNEE ARTHROSCOPY WITH SUBCHONDROPLASTY Right 06/14/2016   Procedure: KNEE ARTHROSCOPY WITH SUBCHONDROPLASTY;  Surgeon: Leandrew Koyanagi, MD;  Location: [place];  Service: Orthopedics;  Laterality: Right;   RADIOACTIVE SEED GUIDED EXCISIONAL BREAST BIOPSY Left 06/23/2014   Procedure: RADIOACTIVE SEED GUIDED EXCISIONAL BREAST BIOPSY;  Surgeon: Stark Klein, MD;  Location: [place];  Service: General;  Laterality: Left;    I have reviewed the social history and family history with the patient and they are unchanged from previous note.  ALLERGIES:  has No Known Allergies.  MEDICATIONS:  Current Outpatient Medications  Medication Sig Dispense Refill   alendronate (FOSAMAX) 70 MG tablet TAKE 1 TABLET BY MOUTH  EVERY 7 DAYS WITH A FULL  GLASS OF WATER ON AN EMPTY  STOMACH 12 tablet 3   Accu-Chek Softclix Lancets lancets Check blood sugar once a day as instructed 102 each 3   albuterol (PROVENTIL) (2.5 MG/3ML) 0.083% nebulizer solution Take 3 mLs (2.5 mg total) by nebulization every 6 (six) hours as needed for wheezing. 1080 mL 3   albuterol (VENTOLIN HFA) 108 (90 Base) MCG/ACT inhaler INHALE 1 TO 2 PUFFS INTO THE LUNGS EVERY 6 HOURS AS NEEDED FOR WHEEZING OR SHORTNESS OF BREATH 54 g 1   atorvastatin (LIPITOR) 40 MG tablet TAKE 1 TABLET BY MOUTH EVERY DAY 90 tablet 3   benazepril (LOTENSIN) 20 MG tablet Take 1 tablet (20 mg total) by mouth daily. 90 tablet 3   Blood Glucose Monitoring Suppl (ONETOUCH VERIO REFLECT) w/Device KIT Check blood sugar once a day as instructed 1 kit 0   celecoxib (CELEBREX) 100 MG capsule TAKE 1 CAPSULE BY MOUTH  TWICE DAILY 180 capsule 3   cholecalciferol (VITAMIN D) 1000 units tablet Take 2,000 Units by mouth daily.     diclofenac Sodium (VOLTAREN) 1 % GEL Apply 2 g topically 4 (four) times daily. 100 g 2   diphenoxylate-atropine (LOMOTIL) 2.5-0.025 MG tablet Take 1 tablet by mouth 4 (four) times daily as  needed for diarrhea or loose stools. 30 tablet 0   famotidine (PEPCID) 40 MG tablet TAKE 1/2 TABLET(20 MG) BY MOUTH TWICE DAILY 30 tablet 3   fluticasone (FLONASE) 50 MCG/ACT nasal spray Place 1 spray into both nostrils daily. 16 g 3   Fluticasone-Salmeterol (ADVAIR DISKUS) 100-50 MCG/DOSE AEPB Inhale 1 puff into the lungs 2 (two) times daily. 60 each 11   furosemide (LASIX) 20 MG tablet TAKE 1 TABLET DAILY ON DAY OF CHEMO AND WHILE 5FU INFUSING THEN 1/2 TABLET DAILY 30 tablet 1   gabapentin (NEURONTIN) 100 MG capsule TAKE 1 CAPSULE(100 MG) BY MOUTH THREE TIMES DAILY 90 capsule 1   gabapentin (NEURONTIN) 300 MG capsule TAKE 1 CAPSULE(300 MG) BY MOUTH TWICE DAILY 60 capsule 3   gabapentin (NEURONTIN) 800 MG tablet Take 800 mg by mouth daily.     glucose blood (ONETOUCH VERIO) test strip Check blood sugar once a day as instructed 100 each 3   HYDROcodone-acetaminophen (NORCO/VICODIN) 5-325 MG tablet Take 1 tablet by mouth 2 (two) times daily as needed for severe pain. 30 tablet 0   ipratropium (ATROVENT) 0.02 % nebulizer solution Take  0.5 mg by nebulization every 6 (six) hours as needed for wheezing or shortness of breath.     latanoprost (XALATAN) 0.005 % ophthalmic solution PLACE 1 DROP INTO BOTH EYES AT BEDTIME  0   loperamide (LOPERAMIDE A-D) 2 MG tablet Take 1 tablet (2 mg total) by mouth 2 (two) times daily as needed for diarrhea or loose stools. 6 tablet 0   loratadine (CLARITIN) 10 MG tablet Take 1 tablet (10 mg total) by mouth daily. 30 tablet 11   magic mouthwash w/lidocaine SOLN Take 5 mLs by mouth 4 (four) times daily as needed for mouth pain. 480 mL 2   meclizine (ANTIVERT) 12.5 MG tablet TAKE 1 TABLET(12.5 MG) BY MOUTH TWICE DAILY AS NEEDED FOR DIZZINESS 15 tablet 3   potassium chloride SA (KLOR-CON M) 20 MEQ tablet TAKE 1 TABLET BY MOUTH EVERY DAY 30 tablet 0   prochlorperazine (COMPAZINE) 10 MG tablet TAKE 1 TABLET(10 MG) BY MOUTH EVERY 6 HOURS AS NEEDED FOR NAUSEA OR VOMITING 30  tablet 3   risperiDONE (RISPERDAL) 0.25 MG tablet Take 0.25 mg by mouth daily as needed.     rivaroxaban (XARELTO) 20 MG TABS tablet Take 1 tablet (20 mg total) by mouth daily with supper. 30 tablet 3   sertraline (ZOLOFT) 50 MG tablet Take 50 mg by mouth daily.     traMADol (ULTRAM) 50 MG tablet Take 1 tablet (50 mg total) by mouth every 8 (eight) hours as needed. 30 tablet 0   traZODone (DESYREL) 50 MG tablet Take 25-50 mg by mouth at bedtime as needed for sleep.      triamcinolone cream (KENALOG) 0.1 % apply to affected area twice a day 454 g 1   Current Facility-Administered Medications  Medication Dose Route Frequency Provider Last Rate Last Admin   sodium chloride flush (NS) 0.9 % injection 10 mL  10 mL Intravenous PRN Alla Feeling, NP   10 mL at 03/02/21 1107    PHYSICAL EXAMINATION: ECOG PERFORMANCE STATUS: 1 - Symptomatic but completely ambulatory  Vitals:   03/02/21 1015  BP: (!) 158/86  Pulse: (!) 107  Resp: 19  Temp: 97.8 F (36.6 C)  SpO2: 100%   Filed Weights   03/02/21 1015  Weight: 126 lb 11.2 oz (57.5 kg)    GENERAL:alert, no distress and comfortable SKIN: no rash  EYES:  sclera clear LUNGS: clear with normal breathing effort HEART: regular rate & rhythm, mild bilateral lower extremity edema ABDOMEN:abdomen soft, non-distended, normal bowel sounds. Tenderness to deep palpation in the RUQ, no palpable mass   NEURO: alert & oriented x 3 with fluent speech, no focal motor deficits PAC without erythema    LABORATORY DATA:  I have reviewed the data as listed CBC Latest Ref Rng & Units 03/02/2021 02/16/2021 02/02/2021  WBC 4.0 - 10.5 K/uL 7.3 5.5 5.2  Hemoglobin 12.0 - 15.0 g/dL 9.2(L) 8.8(L) 8.5(L)  Hematocrit 36.0 - 46.0 % 30.4(L) 28.5(L) 27.3(L)  Platelets 150 - 400 K/uL 189 175 174     CMP Latest Ref Rng & Units 03/02/2021 02/16/2021 02/02/2021  Glucose 70 - 99 mg/dL 269(H) 219(H) 171(H)  BUN 8 - 23 mg/dL 7(L) 7(L) 7(L)  Creatinine 0.44 - 1.00 mg/dL 0.61  0.59 0.58  Sodium 135 - 145 mmol/L 137 137 138  Potassium 3.5 - 5.1 mmol/L 3.7 3.9 3.9  Chloride 98 - 111 mmol/L 103 103 105  CO2 22 - 32 mmol/L _0 Calcium 8.9 - 10.3 mg/dL 8.7(L) 9.0 8.9  Total Protein 6.5 - 8.1 g/dL 6.8 6.8 6.6  Total Bilirubin 0.3 - 1.2 mg/dL 0.4 0.4 0.4  Alkaline Phos 38 - 126 U/L 147(H) 137(H) 116  AST 15 - 41 U/L 33 33 25  ALT 0 - 44 U/L _0 RADIOGRAPHIC STUDIES: I have personally reviewed the radiological images as listed and agreed with the findings in the report. No results found.   ASSESSMENT & PLAN: 71 yo female with   1. Pancreatic cancer with Liver metastasis, stage IV  -Her CT AP from 01/13/20 showed 2cm mass on tail of pancreas abutting but not invading the gastric wall, with likely liver and omental metastasis.  -Her liver biopsy from 01/28/20 showed adenocarcinoma from pancreatic primary. Her 02/11/20 CT Chest showed no metastasis  -She progressed on first-line gemcitabine/ Abraxane and second line liposomal irinotecan/ 5FU  -She switched to third line FOLFOX starting 12/15/2020, with dose reduced oxaliplatin for neuropathy. She overall tolerated well.  -she has worsening RUQ to back pain and steady weight loss on FOLFOX. Pain regimen is effective with tramadol during the day and norco at night.  -CT CAP 02/28/21 shows progression of liver metastasis and enlarging lung nodules that are c/w but not biopsy proven metastasis. We reviewed with her today.  -will d/c FOLFOX and switch to fourth line liposomal irinotecan/5FU starting next week    2. Abnormal breast exam, lateral subcutaneous nodules -Pt reports having a screening mammogram recently at [facility] that was abnormal, a diagnostic mammo/ultrasound was recommended  -I performed a breast exam 02/16/21 which revealed 3 small subcutaneous nodules, 2 in the right breast at 9:00 and 11:00, and 1 in the superior left breast 12:00.  -I received the mammogram report which notes calcifications in  the left breast, a diagnostic mammo was recommended    3. Abdominal Pain, low appetite, 10 lbs weight loss, Transmanitis -She had abdominal pain for the month prior to cancer diagnosis. She is on Norco 5-347m BID as needed. her pain has improved since chemo  -She had sudden loss of appetite and weight loss since October 2021. -On Chemo her appetite has increased, gaining weight and eating more. -now losing weight on FOLFOX 141 - 126 lbs secondary to disease progression.  -I recommend nutrition supplements    4. Comorbidities: Asthma, Borderline Diabetic, GERD, Heart murmur, HLD, HTN, arthritis, neuropathy  -On medications, including multiple diuretics. -Managed by PCP.   -compression stockings for leg edema  -she had pre-existing neuropathy prior to treatment, worsened on chemo -referred to Dr. VMickeal Skinner improved on higher dose gabapentin    -tuning fork exam 12/15/20 shows moderately decreased vibratory sense over the fingertips L>R -We will monitor closely on oxaliplatin, stable on dose reduced to 70 mg/m2 and gabapentin    5. Social support -She lives alone in 1 level house and she is retired. She has 1 adult son who lives in [place] She has a twin and 3 other sisters who live in [place] and other siblings and relative in [place]  -Her son will help her at home  -She has approved for grant to help with medication. Her sister will notify [place].    6. Cytopenias -secondary to chemotherapy   -stable.    7. Goal of care discussion -The patient understands the goal of care is palliative. -currently full code    8. Hypokalemia secondary to diuretic -She is on Lasix and dieretics for her LE edema and heart.  -Doppler  05/19/20 negative for DVT -LE edema fluctuates, increased now. Restart lasix 20 mg po once daily on days 1-3 of chemo, then 10 mg daily    9. Genetic testing from 02/19/20 was negative for pathogenetic mutation with VUS of gene NF1  Disposition:  Mariah Henderson appears stable. She completed 5 cycles of third-line FOLFOX q2 weeks. She tolerates the regimen well without significant side effects. RUQ/back pain is well managed with supportive regimen including tramadol during day and norco at night. Her weight has continued to decline. VSS, and PS remains adequate.   We reviewed her restaging CT which unfortunately shows significant progression of the dominant liver metastasis, and enlarging lung nodules consistent with lung metastasis, although not biopsy-proven. We recommend to discontinue FOLFOX.   The patient was seen with Dr. Burr Medico who recommends to change to fourth line liposomal irinotecan and 5FU q2 weeks. Potential side effects including but not limited to fatigue, low appetite, diarrhea, hair loss, neuropathy, cytopenias, and liver/kidney dysfunction were reviewed. I reviewed symptom management. She understands the treatment goal remains palliative. I discussed that she previously tolerated and responded well to FOLFIRI, but the same benefit with fourth line liposomal irinotecan is not guaranteed. Pt and family understand and wish to proceed.   We will r/s chemo to start next week, then see her back in 3 weeks with cycle 2.   All questions were answered. The patient knows to call the clinic with any problems, questions or concerns. No barriers to learning were detected.     Alla Feeling, NP 03/02/21   Addendum  I have seen the patient, examined her. I agree with the assessment and and plan and have edited the notes.   Mariah Henderson is clinically doing well.  I personally reviewed her recent restaging CT scan images, and discussed the findings with her and her sister.  Unfortunately she has had cancer progression in liver and lung, she did not response to second line FOLFOX.  I recommend her change treatment to third line FOLFIRI with liposomal irinotecan.  Benefit and potential side effects, especially diarrhea, cytopenias, risk of infection,  and hair loss were discussed with her.  She agrees to proceed.  Plan to start next week.  Due to her advanced age and previous cytopenias, will reduce 5-FU dose to 2000 mg/m, and reduce liposomal irinotecan dose for first cycle. All questions were answered.   Truitt Merle  03/02/2021

## 2021-03-02 NOTE — Patient Instructions (Signed)

## 2021-03-03 ENCOUNTER — Telehealth: Payer: Self-pay | Admitting: Hematology

## 2021-03-03 NOTE — Telephone Encounter (Signed)
Left message with follow-up appointments per 2/15 los.

## 2021-03-04 ENCOUNTER — Inpatient Hospital Stay: Payer: Medicare Other

## 2021-03-05 DIAGNOSIS — C252 Malignant neoplasm of tail of pancreas: Secondary | ICD-10-CM | POA: Diagnosis not present

## 2021-03-07 ENCOUNTER — Telehealth: Payer: Self-pay

## 2021-03-07 ENCOUNTER — Telehealth: Payer: Self-pay | Admitting: Hematology

## 2021-03-07 ENCOUNTER — Other Ambulatory Visit: Payer: Self-pay | Admitting: Hematology

## 2021-03-07 ENCOUNTER — Other Ambulatory Visit: Payer: Self-pay

## 2021-03-07 MED ORDER — OXYCODONE HCL 5 MG PO TABS: 5.0000 mg | ORAL_TABLET | ORAL | 0 refills | Status: DC | PRN

## 2021-03-07 NOTE — Telephone Encounter (Signed)
I returned pt's call. She reports worsening right upper quadrant abdominal and low back pain over the weekend, the pain is similar to previous, but worse.  She has tried tramadol and hydrocodone, which did not help her much.  She has anorexia, eating less food, but able to drink 1-2 bottles of Glucerna fluids adequately.  No fever, chills, or other new complaints.  I will call in oxycodone 5 mg 1 to 2 tablets every 6 hours as needed for pain control, constipation management reviewed with her.  She is scheduled for follow-up and new chemotherapy in 2 days. She may need long actiing pain meds soon.  Truitt Merle  03/07/2021

## 2021-03-07 NOTE — Telephone Encounter (Signed)
Pt called stating she's in 10 out of 10 pain in her RUQ and RT lower back.  Pt described the pain as aching, throbbing constant pain.  Pt stated she's nauseous but is not vomiting.  Pt stated she has not had much of an appetite d/t her pain.  Pt stated she's been taking the Norco/Vicodin but it's not working.  Pt is requesting something else for pain.  Informed pt that I would let Dr. Burr Medico and Cira Rue, NP aware of her current increased pain.

## 2021-03-08 MED FILL — Dexamethasone Sodium Phosphate Inj 100 MG/10ML: INTRAMUSCULAR | Qty: 1 | Status: AC

## 2021-03-09 ENCOUNTER — Inpatient Hospital Stay: Payer: Medicare Other

## 2021-03-09 ENCOUNTER — Other Ambulatory Visit: Payer: Self-pay

## 2021-03-09 ENCOUNTER — Encounter: Payer: Self-pay | Admitting: Emergency Medicine

## 2021-03-09 VITALS — BP 164/78 | HR 91 | Temp 99.0°F | Resp 18 | Wt 122.2 lb

## 2021-03-09 DIAGNOSIS — Z7189 Other specified counseling: Secondary | ICD-10-CM

## 2021-03-09 DIAGNOSIS — Z95828 Presence of other vascular implants and grafts: Secondary | ICD-10-CM

## 2021-03-09 DIAGNOSIS — C252 Malignant neoplasm of tail of pancreas: Secondary | ICD-10-CM

## 2021-03-09 DIAGNOSIS — C787 Secondary malignant neoplasm of liver and intrahepatic bile duct: Secondary | ICD-10-CM | POA: Diagnosis not present

## 2021-03-09 DIAGNOSIS — Z452 Encounter for adjustment and management of vascular access device: Secondary | ICD-10-CM | POA: Diagnosis not present

## 2021-03-09 DIAGNOSIS — Z5111 Encounter for antineoplastic chemotherapy: Secondary | ICD-10-CM | POA: Diagnosis not present

## 2021-03-09 LAB — CBC WITH DIFFERENTIAL (CANCER CENTER ONLY)
Abs Immature Granulocytes: 0.02 10*3/uL (ref 0.00–0.07)
Basophils Absolute: 0.1 10*3/uL (ref 0.0–0.1)
Basophils Relative: 1 %
Eosinophils Absolute: 0.2 10*3/uL (ref 0.0–0.5)
Eosinophils Relative: 4 %
HCT: 31.5 % — ABNORMAL LOW (ref 36.0–46.0)
Hemoglobin: 9.6 g/dL — ABNORMAL LOW (ref 12.0–15.0)
Immature Granulocytes: 0 %
Lymphocytes Relative: 6 %
Lymphs Abs: 0.4 10*3/uL — ABNORMAL LOW (ref 0.7–4.0)
MCH: 23.5 pg — ABNORMAL LOW (ref 26.0–34.0)
MCHC: 30.5 g/dL (ref 30.0–36.0)
MCV: 77 fL — ABNORMAL LOW (ref 80.0–100.0)
Monocytes Absolute: 1.2 10*3/uL — ABNORMAL HIGH (ref 0.1–1.0)
Monocytes Relative: 18 %
Neutro Abs: 4.9 10*3/uL (ref 1.7–7.7)
Neutrophils Relative %: 71 %
Platelet Count: 203 10*3/uL (ref 150–400)
RBC: 4.09 MIL/uL (ref 3.87–5.11)
RDW: 23.7 % — ABNORMAL HIGH (ref 11.5–15.5)
WBC Count: 6.9 10*3/uL (ref 4.0–10.5)
nRBC: 0 % (ref 0.0–0.2)

## 2021-03-09 LAB — CMP (CANCER CENTER ONLY)
ALT: 22 U/L (ref 0–44)
AST: 32 U/L (ref 15–41)
Albumin: 3.3 g/dL — ABNORMAL LOW (ref 3.5–5.0)
Alkaline Phosphatase: 162 U/L — ABNORMAL HIGH (ref 38–126)
Anion gap: 4 — ABNORMAL LOW (ref 5–15)
BUN: 7 mg/dL — ABNORMAL LOW (ref 8–23)
CO2: 32 mmol/L (ref 22–32)
Calcium: 9.4 mg/dL (ref 8.9–10.3)
Chloride: 100 mmol/L (ref 98–111)
Creatinine: 0.53 mg/dL (ref 0.44–1.00)
GFR, Estimated: >60 mL/min (ref >60)
Glucose, Bld: 207 mg/dL — ABNORMAL HIGH (ref 70–99)
Potassium: 4.2 mmol/L (ref 3.5–5.1)
Sodium: 136 mmol/L (ref 135–145)
Total Bilirubin: 0.8 mg/dL (ref 0.3–1.2)
Total Protein: 7.1 g/dL (ref 6.5–8.1)

## 2021-03-09 MED ORDER — SODIUM CHLORIDE 0.9 % IV SOLN
10.0000 mg | Freq: Once | INTRAVENOUS | Status: AC
  Administered 2021-03-09: 10 mg via INTRAVENOUS
  Filled 2021-03-09: qty 10

## 2021-03-09 MED ORDER — SODIUM CHLORIDE 0.9 % IV SOLN
55.0000 mg/m2 | Freq: Once | INTRAVENOUS | Status: AC
  Administered 2021-03-09: 86 mg via INTRAVENOUS
  Filled 2021-03-09: qty 20

## 2021-03-09 MED ORDER — PALONOSETRON HCL INJECTION 0.25 MG/5ML
0.2500 mg | Freq: Once | INTRAVENOUS | Status: AC
  Administered 2021-03-09: 0.25 mg via INTRAVENOUS
  Filled 2021-03-09: qty 5

## 2021-03-09 MED ORDER — SODIUM CHLORIDE 0.9 % IV SOLN
400.0000 mg/m2 | Freq: Once | INTRAVENOUS | Status: AC
  Administered 2021-03-09: 632 mg via INTRAVENOUS
  Filled 2021-03-09: qty 31.6

## 2021-03-09 MED ORDER — SODIUM CHLORIDE 0.9% FLUSH: 10.0000 mL | INTRAVENOUS | Status: DC | PRN

## 2021-03-09 MED ORDER — SODIUM CHLORIDE 0.9 % IV SOLN
2000.0000 mg/m2 | INTRAVENOUS | Status: DC
  Administered 2021-03-09: 3150 mg via INTRAVENOUS
  Filled 2021-03-09: qty 63

## 2021-03-09 MED ORDER — HEPARIN SOD (PORK) LOCK FLUSH 100 UNIT/ML IV SOLN: 500.0000 [IU] | Freq: Once | INTRAVENOUS | Status: DC | PRN

## 2021-03-09 MED ORDER — SODIUM CHLORIDE 0.9 % IV SOLN: Freq: Once | INTRAVENOUS | Status: AC

## 2021-03-09 MED ORDER — SODIUM CHLORIDE 0.9% FLUSH
10.0000 mL | Freq: Once | INTRAVENOUS | Status: AC
  Administered 2021-03-09: 10 mL

## 2021-03-09 NOTE — Patient Instructions (Signed)
[facility]  Discharge Instructions: Thank you for choosing [facility] to provide your oncology and hematology care.   If you have a lab appointment with the [facility], please go directly to the [facility] and check in at the registration area.   Wear comfortable clothing and clothing appropriate for easy access to any Portacath or PICC line.   We strive to give you quality time with your provider. You may need to reschedule your appointment if you arrive late (15 or more minutes).  Arriving late affects you and other patients whose appointments are after yours.  Also, if you miss three or more appointments without notifying the office, you may be dismissed from the clinic at the providers discretion.      For prescription refill requests, have your pharmacy contact our office and allow 72 hours for refills to be completed.    Today you received the following chemotherapy and/or immunotherapy agents Irinotecan Liposomal, Leucovorin, Fluorouracil    To help prevent nausea and vomiting after your treatment, we encourage you to take your nausea medication as directed.  BELOW ARE SYMPTOMS THAT SHOULD BE REPORTED IMMEDIATELY: *FEVER GREATER THAN 100.4 F (38 C) OR HIGHER *CHILLS OR SWEATING *NAUSEA AND VOMITING THAT IS NOT CONTROLLED WITH YOUR NAUSEA MEDICATION *UNUSUAL SHORTNESS OF BREATH *UNUSUAL BRUISING OR BLEEDING *URINARY PROBLEMS (pain or burning when urinating, or frequent urination) *BOWEL PROBLEMS (unusual diarrhea, constipation, pain near the anus) TENDERNESS IN MOUTH AND THROAT WITH OR WITHOUT PRESENCE OF ULCERS (sore throat, sores in mouth, or a toothache) UNUSUAL RASH, SWELLING OR PAIN  UNUSUAL VAGINAL DISCHARGE OR ITCHING   Items with * indicate a potential emergency and should be followed up as soon as possible or go to the Emergency Department if any problems should occur.  Please show the CHEMOTHERAPY ALERT CARD or  IMMUNOTHERAPY ALERT CARD at check-in to the Emergency Department and triage nurse.  Should you have questions after your visit or need to cancel or reschedule your appointment, please contact [facility]  Dept: [phone]  and follow the prompts.  Office hours are 8:00 a.m. to 4:30 p.m. Monday - Friday. Please note that voicemails left after 4:00 p.m. may not be returned until the following business day.  We are closed weekends and major holidays. You have access to a nurse at all times for urgent questions. Please call the main number to the clinic Dept: [phone] and follow the prompts.   For any non-urgent questions, you may also contact your provider using MyChart. We now offer e-Visits for anyone 76 and older to request care online for non-urgent symptoms. For details visit [email or web address].   Also download the MyChart app! Go to the app store, search "MyChart", open the app, select [facility], and log in with your MyChart username and password.  Due to Covid, a mask is required upon entering the hospital/clinic. If you do not have a mask, one will be given to you upon arrival. For doctor visits, patients may have 1 support person aged 23 or older with them. For treatment visits, patients cannot have anyone with them due to current Covid guidelines and our immunocompromised population.   Irinotecan Liposomal injection What is this medication? IRINOTECAN LIPOSOME (eye ri noe TEE kan LIP oh som) is a chemotherapy drug. It is used to treat pancreatic cancer. This medicine may be used for other purposes; ask your health care provider or pharmacist if you have questions. COMMON  BRAND NAME(S): ONIVYDE What should I tell my care team before I take this medication? They need to know if you have any of these conditions: bleeding disorders dehydration history of blood diseases, like sickle cell anemia or leukemia history of low levels of calcium, magnesium,  or potassium in the blood infection (especially a virus infection such as chickenpox, cold sores, or herpes) liver disease low blood counts, like low white cell, platelet, or red cell counts lung or breathing disease, like asthma recent or ongoing radiation therapy an unusual or allergic reaction to irinotecan liposome, other medicines, foods, dyes, or preservatives pregnant or trying to get pregnant breast-feeding How should I use this medication? This drug is given as an infusion into a vein. It is administered in a hospital or clinic by a specially trained health care professional. Talk to your pediatrician regarding the use of this medicine in children. Special care may be needed. Overdosage: If you think you have taken too much of this medicine contact a poison control center or emergency room at once. NOTE: This medicine is only for you. Do not share this medicine with others. What if I miss a dose? It is important not to miss your dose. Call your doctor or health care professional if you are unable to keep an appointment. What may interact with this medication? This medicine may interact with the following medications: antiviral medicines for HIV or AIDS certain medications for fungal infections like ketoconazole, itraconazole, voriconazole certain medications for seizures like carbamazepine, fosphenytoin, phenytoin clarithromycin gemfibrozil mephobarbital nefazodone phenobarbital primidone rifabutin rifampin rifapentine St. John's Wort telaprevir This list may not describe all possible interactions. Give your health care provider a list of all the medicines, herbs, non-prescription drugs, or dietary supplements you use. Also tell them if you smoke, drink alcohol, or use illegal drugs. Some items may interact with your medicine. What should I watch for while using this medication? Check with your doctor or health care professional if you get an attack of severe diarrhea,  nausea and vomiting, or if you sweat a lot. The loss of too much body fluid can make it dangerous for you to take this medicine. This medicine may interfere with the ability to have a child. You should talk with your doctor or health care professional if you are concerned about your fertility. Do not become pregnant while taking this medicine or for 1 month after the last dose; males with female partners should use condoms during treatment and for 4 months after the last dose. Women should inform their doctor if they wish to become pregnant or think they might be pregnant. There is a potential for serious side effects to an unborn child. Talk to your health care professional or pharmacist for more information. Do not breast-feed an infant while taking this medicine or for 1 month after the last dose. Avoid taking products that contain aspirin, acetaminophen, ibuprofen, naproxen, or ketoprofen unless instructed by your doctor. These medicines may hide a fever. Be careful brushing and flossing your teeth or using a toothpick because you may get an infection or bleed more easily. If you have any dental work done, tell your dentist you are receiving this medicine. Call your doctor or health care professional for advice if you get a fever, chills or sore throat, or other symptoms of a cold or flu. Do not treat yourself. This drug decreases your body's ability to fight infections. Try to avoid being around people who are sick. This medicine may increase your  risk to bruise or bleed. Call your doctor or health care professional if you notice any unusual bleeding. This drug may make you feel generally unwell. This is not uncommon, as chemotherapy can affect healthy cells as well as cancer cells. Report any side effects. Continue your course of treatment even though you feel ill unless your doctor tells you to stop. What side effects may I notice from receiving this medication? Side effects that you should report to  your doctor or health care professional as soon as possible: allergic reactions like skin rash, itching or hives, swelling of the face, lips, or tongue breathing problems cough diarrhea low blood counts - this medicine may decrease the number of white blood cells, red blood cells and platelets. You may be at increased risk for infections and bleeding nausea, vomiting signs and symptoms of bleeding such as bloody or black, tarry stools; red or dark-brown urine; spitting up blood or brown material that looks like coffee grounds; red spots on the skin; unusual bruising or bleeding from the eye, gums, or nose signs of decreased red blood cells - unusually weak or tired, feeling faint or lightheaded, falls signs and symptoms of infection like fever or chills; cough; sore throat; pain or trouble passing urine signs and symptoms of liver injury like dark yellow or brown urine; general ill feeling or flu-like symptoms; light-colored stools; loss of appetite; nausea; right upper belly pain; unusually weak or tired; yellowing of the eyes or skin stomach pain Side effects that usually do not require medical attention (report to your doctor or health care professional if they continue or are bothersome): hair loss loss of appetite mouth sores upset stomach This list may not describe all possible side effects. Call your doctor for medical advice about side effects. You may report side effects to FDA at 1-800-FDA-1088. Where should I keep my medication? This drug is given in a hospital or clinic and will not be stored at home. NOTE: This sheet is a summary. It may not cover all possible information. If you have questions about this medicine, talk to your doctor, pharmacist, or health care provider.  2022 Elsevier/Gold Standard (2015-02-04 00:00:00)

## 2021-03-09 NOTE — Research (Signed)
A Randomized pragmatic Chair-Based Home Exercise Intervention for Mitigating Cancer-Related Fatigue in Older Adults Undergoing Chemotherapy for Advanced Disease  03/09/21  Patient Mariah Henderson was identified by this research coordinator as a potential candidate for the above listed study.  This Clinical Research Coordinator met with Mariah Henderson, MRN [id number], on 03/09/21 in a manner and location that ensures patient privacy to discuss participation in the above listed research study.  Patient is Unaccompanied.  A copy of the informed consent document and separate HIPAA Authorization was provided to the patient.  Patient reads, speaks, and understands [place].    Patient was provided with the business card of this Coordinator and encouraged to contact the research team with any questions.  Approximately 15 minutes were spent with the patient reviewing the informed consent documents.  Patient was provided the option of taking informed consent documents home to review and was encouraged to review at their convenience with their support network, including other care providers. Patient took the consent documents home to review.  Patient denies questions at this time.  Will follow up with the patient over the phone to determine if she is interested in this study.  Patient is aware she would need to enroll prior to the second cycle of chemotherapy.    Clabe Seal Clinical Research Coordinator I  03/09/21 1:33 PM

## 2021-03-09 NOTE — Progress Notes (Signed)
Pt requested to see MD at chairside. Dr.Feng saw pt and answered pt questions. Per Dr.Feng we are ok to proceed today.

## 2021-03-09 NOTE — Progress Notes (Signed)
Called niece, Mariah Henderson, on the phone to inform her of patient readiness to leave- during the conversation new parameters for patient oxycodone were discussed. Per Dr. Burr Medico, patient can now take the 5mg  of oxycodone Q4-5 hours instead of the previously prescribed 6 hours -not to exceed 5 tablets a day. Both patient and niece expressed understanding of the new conditions. ( Had patient permission to speak with niece who helps caregive).

## 2021-03-11 ENCOUNTER — Other Ambulatory Visit: Payer: Self-pay

## 2021-03-11 ENCOUNTER — Inpatient Hospital Stay: Payer: Medicare Other

## 2021-03-11 VITALS — BP 155/78 | HR 99 | Temp 99.8°F | Resp 16

## 2021-03-11 DIAGNOSIS — Z7189 Other specified counseling: Secondary | ICD-10-CM

## 2021-03-11 DIAGNOSIS — C252 Malignant neoplasm of tail of pancreas: Secondary | ICD-10-CM

## 2021-03-11 DIAGNOSIS — Z452 Encounter for adjustment and management of vascular access device: Secondary | ICD-10-CM | POA: Diagnosis not present

## 2021-03-11 DIAGNOSIS — C787 Secondary malignant neoplasm of liver and intrahepatic bile duct: Secondary | ICD-10-CM | POA: Diagnosis not present

## 2021-03-11 DIAGNOSIS — Z5111 Encounter for antineoplastic chemotherapy: Secondary | ICD-10-CM | POA: Diagnosis not present

## 2021-03-11 MED ORDER — HEPARIN SOD (PORK) LOCK FLUSH 100 UNIT/ML IV SOLN
500.0000 [IU] | Freq: Once | INTRAVENOUS | Status: AC | PRN
  Administered 2021-03-11: 500 [IU]

## 2021-03-11 MED ORDER — SODIUM CHLORIDE 0.9% FLUSH
10.0000 mL | INTRAVENOUS | Status: DC | PRN
  Administered 2021-03-11: 10 mL

## 2021-03-15 ENCOUNTER — Telehealth: Payer: Self-pay | Admitting: Emergency Medicine

## 2021-03-15 NOTE — Telephone Encounter (Signed)
A Randomized pragmatic Chair-Based Home Exercise Intervention for Mitigating Cancer-Related Fatigue in Older Adults Undergoing Chemotherapy for Advanced Disease  03/15/21  Called to follow up regarding interest in participating in this study.  Patient states she would like to participate.  She did not have any questions at this time.  Patient is aware she would need to sign consent and be registered prior to starting her cycle 2 of chemo.  She will check with her daughter her drives her to appointments to see if she can come early to her appointments on 3/8 in order to sign consent and be registered to this study.  Patient requested a follow up phone call tomorrow once she has had time to talk with her daughter.  Clabe Seal Clinical Research Coordinator I  03/15/21  4:15 PM

## 2021-03-16 ENCOUNTER — Inpatient Hospital Stay: Payer: Medicare Other | Admitting: Hematology

## 2021-03-16 ENCOUNTER — Other Ambulatory Visit (HOSPITAL_COMMUNITY): Payer: Medicare Other

## 2021-03-16 ENCOUNTER — Inpatient Hospital Stay: Payer: Medicare Other

## 2021-03-17 ENCOUNTER — Telehealth: Payer: Self-pay

## 2021-03-17 ENCOUNTER — Other Ambulatory Visit: Payer: Self-pay | Admitting: Hematology

## 2021-03-17 ENCOUNTER — Telehealth: Payer: Self-pay | Admitting: Emergency Medicine

## 2021-03-17 DIAGNOSIS — R600 Localized edema: Secondary | ICD-10-CM

## 2021-03-17 NOTE — Telephone Encounter (Signed)
A Randomized pragmatic Chair-Based Home Exercise Intervention for Mitigating Cancer-Related Fatigue in Older Adults Undergoing Chemotherapy for Advanced Disease ? ?03/17/21 ? ?Called patient to discuss this study.  Patient asked to be called back next week.  Patient is aware she would need to be enrolled prior to her next cycle of chemo scheduled for Wednesday 3/8. ? ?Will plan to call this patient next week to determine if she plans to participate and review next steps. ? ?Clabe Seal ?Clinical Research Coordinator I  ?03/17/21 2:13 PM ? ? ?

## 2021-03-17 NOTE — Telephone Encounter (Signed)
RTC to patient is currently on medications that cause constipation from the [facility].  Patient states she was not given anything for. Has been having stools but are hard to get out.  Would like something sent to the [facility] on [address]. ?

## 2021-03-17 NOTE — Telephone Encounter (Signed)
Patient called back regarding constipation. States, "a little bit came out this morning but it was real hard. I had to sit there for an hour before it came out." Discussed increasing fiber intake, fluids, especially water, and physical activity. States she can do that. Also, discussed calling [facility] to see if there is anything they would suggest since they ordered the med that is causing constipation. States she will call them now. ?

## 2021-03-17 NOTE — Telephone Encounter (Signed)
Requesting medication for constipation, please call pt back.  ?

## 2021-03-18 ENCOUNTER — Inpatient Hospital Stay: Payer: Medicare Other

## 2021-03-21 ENCOUNTER — Telehealth: Payer: Self-pay | Admitting: Emergency Medicine

## 2021-03-21 NOTE — Telephone Encounter (Signed)
A Randomized pragmatic Chair-Based Home Exercise Intervention for Mitigating Cancer-Related Fatigue in Older Adults Undergoing Chemotherapy for Advanced Disease ? ?03/21/21 ? ?Followed up with patient regarding this study.  Patient states she is not interested in any extra visits at this time due to her pain.  Advised patient that she would need to sign consent and perform baseline assessments prior to her infusion on Wednesday if she would like to participate.  Patient declined to schedule research visit at this time.  Patient verbalized understanding that she would not be able to enroll in this study after the 2nd cycle. ? ?She was advised to call with any questions or if she changes her mind regarding this study. ? ?Clabe Seal ?Clinical Research Coordinator I  ?03/21/21  3:46 PM ? ?

## 2021-03-23 ENCOUNTER — Other Ambulatory Visit: Payer: Self-pay

## 2021-03-23 ENCOUNTER — Inpatient Hospital Stay: Payer: Medicare Other

## 2021-03-23 ENCOUNTER — Inpatient Hospital Stay (HOSPITAL_BASED_OUTPATIENT_CLINIC_OR_DEPARTMENT_OTHER): Payer: Medicare Other | Admitting: Hematology

## 2021-03-23 ENCOUNTER — Telehealth: Payer: Self-pay | Admitting: Nurse Practitioner

## 2021-03-23 ENCOUNTER — Inpatient Hospital Stay: Payer: Medicare Other | Attending: Nurse Practitioner

## 2021-03-23 ENCOUNTER — Encounter: Payer: Self-pay | Admitting: Hematology

## 2021-03-23 ENCOUNTER — Inpatient Hospital Stay: Payer: Medicare Other | Admitting: Dietician

## 2021-03-23 VITALS — BP 151/81 | HR 90 | Temp 98.4°F | Resp 16

## 2021-03-23 DIAGNOSIS — C787 Secondary malignant neoplasm of liver and intrahepatic bile duct: Secondary | ICD-10-CM | POA: Diagnosis not present

## 2021-03-23 DIAGNOSIS — C252 Malignant neoplasm of tail of pancreas: Secondary | ICD-10-CM

## 2021-03-23 DIAGNOSIS — Z95828 Presence of other vascular implants and grafts: Secondary | ICD-10-CM

## 2021-03-23 DIAGNOSIS — Z7189 Other specified counseling: Secondary | ICD-10-CM

## 2021-03-23 DIAGNOSIS — Z452 Encounter for adjustment and management of vascular access device: Secondary | ICD-10-CM | POA: Insufficient documentation

## 2021-03-23 DIAGNOSIS — Z5111 Encounter for antineoplastic chemotherapy: Secondary | ICD-10-CM | POA: Diagnosis not present

## 2021-03-23 LAB — CMP (CANCER CENTER ONLY)
ALT: 27 U/L (ref 0–44)
AST: 31 U/L (ref 15–41)
Albumin: 3.2 g/dL — ABNORMAL LOW (ref 3.5–5.0)
Alkaline Phosphatase: 161 U/L — ABNORMAL HIGH (ref 38–126)
Anion gap: 4 — ABNORMAL LOW (ref 5–15)
BUN: 8 mg/dL (ref 8–23)
CO2: 33 mmol/L — ABNORMAL HIGH (ref 22–32)
Calcium: 9.6 mg/dL (ref 8.9–10.3)
Chloride: 98 mmol/L (ref 98–111)
Creatinine: 0.5 mg/dL (ref 0.44–1.00)
GFR, Estimated: >60 mL/min (ref >60)
Glucose, Bld: 282 mg/dL — ABNORMAL HIGH (ref 70–99)
Potassium: 3.6 mmol/L (ref 3.5–5.1)
Sodium: 135 mmol/L (ref 135–145)
Total Bilirubin: 1.1 mg/dL (ref 0.3–1.2)
Total Protein: 6.5 g/dL (ref 6.5–8.1)

## 2021-03-23 LAB — CBC WITH DIFFERENTIAL (CANCER CENTER ONLY)
Abs Immature Granulocytes: 0.02 10*3/uL (ref 0.00–0.07)
Basophils Absolute: 0 10*3/uL (ref 0.0–0.1)
Basophils Relative: 0 %
Eosinophils Absolute: 0.2 10*3/uL (ref 0.0–0.5)
Eosinophils Relative: 2 %
HCT: 30 % — ABNORMAL LOW (ref 36.0–46.0)
Hemoglobin: 9.1 g/dL — ABNORMAL LOW (ref 12.0–15.0)
Immature Granulocytes: 0 %
Lymphocytes Relative: 3 %
Lymphs Abs: 0.2 10*3/uL — ABNORMAL LOW (ref 0.7–4.0)
MCH: 23.3 pg — ABNORMAL LOW (ref 26.0–34.0)
MCHC: 30.3 g/dL (ref 30.0–36.0)
MCV: 76.7 fL — ABNORMAL LOW (ref 80.0–100.0)
Monocytes Absolute: 0.8 10*3/uL (ref 0.1–1.0)
Monocytes Relative: 10 %
Neutro Abs: 6.6 10*3/uL (ref 1.7–7.7)
Neutrophils Relative %: 85 %
Platelet Count: 222 10*3/uL (ref 150–400)
RBC: 3.91 MIL/uL (ref 3.87–5.11)
RDW: 25.4 % — ABNORMAL HIGH (ref 11.5–15.5)
WBC Count: 7.9 10*3/uL (ref 4.0–10.5)
nRBC: 0 % (ref 0.0–0.2)

## 2021-03-23 MED ORDER — SODIUM CHLORIDE 0.9 % IV SOLN
1500.0000 mg/m2 | INTRAVENOUS | Status: DC
  Administered 2021-03-23: 2350 mg via INTRAVENOUS
  Filled 2021-03-23: qty 47

## 2021-03-23 MED ORDER — SODIUM CHLORIDE 0.9 % IV SOLN
10.0000 mg | Freq: Once | INTRAVENOUS | Status: AC
  Administered 2021-03-23: 10 mg via INTRAVENOUS
  Filled 2021-03-23: qty 10

## 2021-03-23 MED ORDER — SODIUM CHLORIDE 0.9 % IV SOLN: Freq: Once | INTRAVENOUS | Status: AC

## 2021-03-23 MED ORDER — SODIUM CHLORIDE 0.9 % IV SOLN
40.0000 mg/m2 | Freq: Once | INTRAVENOUS | Status: AC
  Administered 2021-03-23: 64.5 mg via INTRAVENOUS
  Filled 2021-03-23: qty 15

## 2021-03-23 MED ORDER — SODIUM CHLORIDE 0.9% FLUSH
10.0000 mL | INTRAVENOUS | Status: DC | PRN
  Administered 2021-03-23: 10 mL

## 2021-03-23 MED ORDER — OXYCODONE HCL 10 MG PO TABS
10.0000 mg | ORAL_TABLET | ORAL | 0 refills | Status: AC | PRN
Start: 2021-03-23 — End: ?

## 2021-03-23 MED ORDER — SODIUM CHLORIDE 0.9 % IV SOLN
400.0000 mg/m2 | Freq: Once | INTRAVENOUS | Status: AC
  Administered 2021-03-23: 632 mg via INTRAVENOUS
  Filled 2021-03-23: qty 31.6

## 2021-03-23 MED ORDER — SODIUM CHLORIDE 0.9% FLUSH
10.0000 mL | Freq: Once | INTRAVENOUS | Status: AC
  Administered 2021-03-23: 10 mL

## 2021-03-23 MED ORDER — PALONOSETRON HCL INJECTION 0.25 MG/5ML
0.2500 mg | Freq: Once | INTRAVENOUS | Status: AC
  Administered 2021-03-23: 0.25 mg via INTRAVENOUS
  Filled 2021-03-23: qty 5

## 2021-03-23 MED ORDER — OXYCODONE HCL 5 MG PO TABS
10.0000 mg | ORAL_TABLET | Freq: Once | ORAL | Status: AC
  Administered 2021-03-23: 10 mg via ORAL
  Filled 2021-03-23: qty 2

## 2021-03-23 NOTE — Telephone Encounter (Signed)
Scheduled per 3/8 secure chat w/ RN, message has been left with pt ?

## 2021-03-23 NOTE — Progress Notes (Signed)
Nutrition Follow-up: ? ?Patient with pancreatic cancer. She is currently receiving fourth line liposomal irinotecan/leucovorin/5-FU ? ?Met with patient during infusion. She is awake laying in bed. Patient reports having back pain from cancer in her liver. She reports poor appetite. Patient reports not wanting to eat when she hurts like this. Patient has been drinking Glucerna supplement. Patient is taking pain medication. She reports mild constipation. Patient had a small bowel movement yesterday, reports stool was hard. She will start taking stool softener. Patient reports she is drinking water frequently during the day. Patient denies nausea, vomiting, diarrhea.  ? ? ?Medications: Lasix, Klor-con, Oxycodone, Gabapentin ? ?Labs: Glucose 282 ? ?Anthropometrics: Weight 116 lb 12 oz today decreased 4.9% (6 lbs) in 2 weeks; significant ? ?2/22 - 122 lb 4 oz ? ? ?NUTRITION DIAGNOSIS: Unintended weight loss ongoing ? ? ?INTERVENTION:  ?Encouraged eating small frequent meals/snacks, bites/sips q2h - handout with snack ideas and soft, moist high protein foods provided ?Discussed tips for poor appetite - handout provided ?Recommend switching supplement to Ensure Plus/equivalent and drinking 3/day - coupons provided ?Ensure Complete provided for pt to try during infusion today ? ? ? ?MONITORING, EVALUATION, GOAL: weight trends, intake ? ? ?NEXT VISIT: Wednesday March 22 during infusion  ? ? ? ?

## 2021-03-23 NOTE — Progress Notes (Signed)
Per verbal order from Dr. Burr Medico, place referral for Palliative Care.  Referral for Jobe Gibbon, NP and referral for [facility] - Palliative Care ordered.  Faxed Referral to [facility] [phone]  F [phone]).  Fax confirmation received. ?

## 2021-03-23 NOTE — Patient Instructions (Signed)
[facility]  Discharge Instructions: Thank you for choosing [facility] to provide your oncology and hematology care.   If you have a lab appointment with the [facility], please go directly to the [facility] and check in at the registration area.   Wear comfortable clothing and clothing appropriate for easy access to any Portacath or PICC line.   We strive to give you quality time with your provider. You may need to reschedule your appointment if you arrive late (15 or more minutes).  Arriving late affects you and other patients whose appointments are after yours.  Also, if you miss three or more appointments without notifying the office, you may be dismissed from the clinic at the providers discretion.      For prescription refill requests, have your pharmacy contact our office and allow 72 hours for refills to be completed.    Today you received the following chemotherapy and/or immunotherapy agents Irinotecan Liposomal, Leucovorin, Fluorouracil    To help prevent nausea and vomiting after your treatment, we encourage you to take your nausea medication as directed.  BELOW ARE SYMPTOMS THAT SHOULD BE REPORTED IMMEDIATELY: *FEVER GREATER THAN 100.4 F (38 C) OR HIGHER *CHILLS OR SWEATING *NAUSEA AND VOMITING THAT IS NOT CONTROLLED WITH YOUR NAUSEA MEDICATION *UNUSUAL SHORTNESS OF BREATH *UNUSUAL BRUISING OR BLEEDING *URINARY PROBLEMS (pain or burning when urinating, or frequent urination) *BOWEL PROBLEMS (unusual diarrhea, constipation, pain near the anus) TENDERNESS IN MOUTH AND THROAT WITH OR WITHOUT PRESENCE OF ULCERS (sore throat, sores in mouth, or a toothache) UNUSUAL RASH, SWELLING OR PAIN  UNUSUAL VAGINAL DISCHARGE OR ITCHING   Items with * indicate a potential emergency and should be followed up as soon as possible or go to the Emergency Department if any problems should occur.  Please show the CHEMOTHERAPY ALERT CARD or  IMMUNOTHERAPY ALERT CARD at check-in to the Emergency Department and triage nurse.  Should you have questions after your visit or need to cancel or reschedule your appointment, please contact [facility]  Dept: [phone]  and follow the prompts.  Office hours are 8:00 a.m. to 4:30 p.m. Monday - Friday. Please note that voicemails left after 4:00 p.m. may not be returned until the following business day.  We are closed weekends and major holidays. You have access to a nurse at all times for urgent questions. Please call the main number to the clinic Dept: [phone] and follow the prompts.   For any non-urgent questions, you may also contact your provider using MyChart. We now offer e-Visits for anyone 76 and older to request care online for non-urgent symptoms. For details visit [email or web address].   Also download the MyChart app! Go to the app store, search "MyChart", open the app, select [facility], and log in with your MyChart username and password.  Due to Covid, a mask is required upon entering the hospital/clinic. If you do not have a mask, one will be given to you upon arrival. For doctor visits, patients may have 1 support person aged 23 or older with them. For treatment visits, patients cannot have anyone with them due to current Covid guidelines and our immunocompromised population.   Irinotecan Liposomal injection What is this medication? IRINOTECAN LIPOSOME (eye ri noe TEE kan LIP oh som) is a chemotherapy drug. It is used to treat pancreatic cancer. This medicine may be used for other purposes; ask your health care provider or pharmacist if you have questions. COMMON  BRAND NAME(S): ONIVYDE What should I tell my care team before I take this medication? They need to know if you have any of these conditions: bleeding disorders dehydration history of blood diseases, like sickle cell anemia or leukemia history of low levels of calcium, magnesium,  or potassium in the blood infection (especially a virus infection such as chickenpox, cold sores, or herpes) liver disease low blood counts, like low white cell, platelet, or red cell counts lung or breathing disease, like asthma recent or ongoing radiation therapy an unusual or allergic reaction to irinotecan liposome, other medicines, foods, dyes, or preservatives pregnant or trying to get pregnant breast-feeding How should I use this medication? This drug is given as an infusion into a vein. It is administered in a hospital or clinic by a specially trained health care professional. Talk to your pediatrician regarding the use of this medicine in children. Special care may be needed. Overdosage: If you think you have taken too much of this medicine contact a poison control center or emergency room at once. NOTE: This medicine is only for you. Do not share this medicine with others. What if I miss a dose? It is important not to miss your dose. Call your doctor or health care professional if you are unable to keep an appointment. What may interact with this medication? This medicine may interact with the following medications: antiviral medicines for HIV or AIDS certain medications for fungal infections like ketoconazole, itraconazole, voriconazole certain medications for seizures like carbamazepine, fosphenytoin, phenytoin clarithromycin gemfibrozil mephobarbital nefazodone phenobarbital primidone rifabutin rifampin rifapentine St. John's Wort telaprevir This list may not describe all possible interactions. Give your health care provider a list of all the medicines, herbs, non-prescription drugs, or dietary supplements you use. Also tell them if you smoke, drink alcohol, or use illegal drugs. Some items may interact with your medicine. What should I watch for while using this medication? Check with your doctor or health care professional if you get an attack of severe diarrhea,  nausea and vomiting, or if you sweat a lot. The loss of too much body fluid can make it dangerous for you to take this medicine. This medicine may interfere with the ability to have a child. You should talk with your doctor or health care professional if you are concerned about your fertility. Do not become pregnant while taking this medicine or for 1 month after the last dose; males with female partners should use condoms during treatment and for 4 months after the last dose. Women should inform their doctor if they wish to become pregnant or think they might be pregnant. There is a potential for serious side effects to an unborn child. Talk to your health care professional or pharmacist for more information. Do not breast-feed an infant while taking this medicine or for 1 month after the last dose. Avoid taking products that contain aspirin, acetaminophen, ibuprofen, naproxen, or ketoprofen unless instructed by your doctor. These medicines may hide a fever. Be careful brushing and flossing your teeth or using a toothpick because you may get an infection or bleed more easily. If you have any dental work done, tell your dentist you are receiving this medicine. Call your doctor or health care professional for advice if you get a fever, chills or sore throat, or other symptoms of a cold or flu. Do not treat yourself. This drug decreases your body's ability to fight infections. Try to avoid being around people who are sick. This medicine may increase your  risk to bruise or bleed. Call your doctor or health care professional if you notice any unusual bleeding. This drug may make you feel generally unwell. This is not uncommon, as chemotherapy can affect healthy cells as well as cancer cells. Report any side effects. Continue your course of treatment even though you feel ill unless your doctor tells you to stop. What side effects may I notice from receiving this medication? Side effects that you should report to  your doctor or health care professional as soon as possible: allergic reactions like skin rash, itching or hives, swelling of the face, lips, or tongue breathing problems cough diarrhea low blood counts - this medicine may decrease the number of white blood cells, red blood cells and platelets. You may be at increased risk for infections and bleeding nausea, vomiting signs and symptoms of bleeding such as bloody or black, tarry stools; red or dark-brown urine; spitting up blood or brown material that looks like coffee grounds; red spots on the skin; unusual bruising or bleeding from the eye, gums, or nose signs of decreased red blood cells - unusually weak or tired, feeling faint or lightheaded, falls signs and symptoms of infection like fever or chills; cough; sore throat; pain or trouble passing urine signs and symptoms of liver injury like dark yellow or brown urine; general ill feeling or flu-like symptoms; light-colored stools; loss of appetite; nausea; right upper belly pain; unusually weak or tired; yellowing of the eyes or skin stomach pain Side effects that usually do not require medical attention (report to your doctor or health care professional if they continue or are bothersome): hair loss loss of appetite mouth sores upset stomach This list may not describe all possible side effects. Call your doctor for medical advice about side effects. You may report side effects to FDA at 1-800-FDA-1088. Where should I keep my medication? This drug is given in a hospital or clinic and will not be stored at home. NOTE: This sheet is a summary. It may not cover all possible information. If you have questions about this medicine, talk to your doctor, pharmacist, or health care provider.  2022 Elsevier/Gold Standard (2015-02-04 00:00:00)

## 2021-03-23 NOTE — Progress Notes (Addendum)
[place]   [phone] [phone]   Clinic Follow up Note   Patient Care Team: Sid Falcon, MD as PCP - General (Internal Medicine) Forrest Moron, OD (Optometry) Jonnie Finner, RN (Inactive) as Oncology Nurse Navigator Truitt Merle, MD as Consulting Physician (Oncology) Gwyndolyn Kaufman, RN (Inactive) as Registered Nurse  Date of Service:  03/23/2021  CHIEF COMPLAINT: f/u of metastatic pancreatic cancer to liver  CURRENT THERAPY:  Fourth-line Irinotecan (liposomal) and 5FU, q2weeks, started 03/09/21  ASSESSMENT & PLAN:  Mariah Henderson is a 71 y.o. female with   1. Pancreatic cancer with Liver metastasis, stage IV  -Mariah Henderson CT AP from 01/13/20 showed 2cm mass on tail of pancreas abutting but not invading the gastric wall, with likely liver and omental metastasis.  -Mariah Henderson liver biopsy from 01/28/20 showed adenocarcinoma from pancreatic primary. Mariah Henderson 02/11/20 CT Chest showed no metastasis  -She progressed on first-line gemcitabine/ Abraxane and second line liposomal irinotecan/ 5FU  -She switched to third line FOLFOX starting 12/15/20, with dose reduced oxaliplatin for neuropathy. She overall tolerated well.  -she has worsening RUQ to back pain and steady weight loss on FOLFOX.  -CT CAP 02/28/21 shows progression of liver metastasis and enlarging lung nodules that are c/w but not biopsy proven metastasis.  -we switched to fourth line liposomal irinotecan/5FU on 03/09/21. She tolerated well overall but is still experiencing abdominal pain and appetite loss. She would like to proceed with treatment today so I will reduce Mariah Henderson dose today.  -She previously progressed on liposomal irinotecan and 5-FU, I will switch Mariah Henderson regimen to gemcitabine/docetaxol and xeloda on next cycle  -will request FO on Mariah Henderson previous liver biopsy, to see if she has any targetable mutation.   2. Abnormal breast exam, lateral subcutaneous nodules -scre[facility] mamm[facility] on  02/10/21 was abnormal, a diagnostic mammo/ultrasound was recommended  -breast exam on 02/16/21 which revealed 3 small subcutaneous nodules, 2 in the right breast at 9:00 and 11:00, and 1 in the superior left breast 12:00.  -Mammogram on February 23, 2021 showed indeterminate calcification, diagnostic mammogram was recommended   3. Abdominal Pain, low appetite, weight loss, Transmanitis -She had abdominal pain for the month prior to cancer diagnosis. Mariah Henderson pain initially improved on chemo, but she is having recurrent RUQ pain now. She is taking Norco and oxycodone. -I will refer Mariah Henderson to palliative care nurse Lexine Baton and refer Mariah Henderson for at-home care -She had sudden loss of appetite and weight loss since October 2021. She initially improved on chemo but notes she has lost Mariah Henderson appetite again. She met with nutritionist today.   4. Comorbidities: Asthma, Borderline Diabetic, GERD, Heart murmur, HLD, HTN, arthritis, neuropathy  -On medications, including multiple diuretics. -Managed by PCP.   -compression stockings for leg edema  -she had pre-existing neuropathy prior to treatment, worsened on chemo. She was previously referred to Dr. [name], neuropathy improved on higher dose gabapentin      5. Social support -She lives alone in 1 level house and she is retired. She has 1 adult son who lives in [place]. She has a twin and 3 other sisters who live in [place], and other siblings and relative in [place].  -Mariah Henderson son will help Mariah Henderson at home  -She has approved for grant to help with medication. Mariah Henderson sister will notify us of the type of grant.    6. Cytopenias -secondary to chemotherapy   -stable.    7. Goal of care discussion -The patient understands the  goal of care is palliative. -currently full code    8. Hypokalemia secondary to diuretic -She is on Lasix and dieretics for Mariah Henderson LE edema and heart.  -Doppler 05/19/20 negative for DVT -LE edema fluctuates, increased now. Restart lasix 20 mg po once daily on days 1-3  of chemo, then 10 mg daily    9. Genetic testing from 02/19/20 was negative for pathogenetic mutation with VUS of gene NF1   PLAN: -proceed with C2 irinotecan/5FU, with reduced dose -I refilled Mariah Henderson oxycodone and increase dose to 9m q6h  -referral to home palliative care -change treatment to gemcitabine, docetaxol and xeloda in 2 weeks  -I requested FO on Mariah Henderson liver biopsy sample    No problem-specific Assessment & Plan notes found for this encounter.   SUMMARY OF ONCOLOGIC HISTORY: Oncology History Overview Note  Cancer Staging Pancreatic cancer ([facility] Staging form: Exocrine Pancreas, AJCC 8th Edition - Clinical: Stage IV (cT1c, cN0, pM1) - Signed by FTruitt Merle MD on 02/26/2020 Total positive nodes: 0    Pancreatic cancer ([facility]  01/13/2020 Imaging   UKoreaAbdomen 01/13/20  IMPRESSION: 1. Suggestion of 3 vascular hypoechoic mass within the liver measuring up to 2.5 cm. 2. Suggestion of a hypoechoic mass within the tail of the pancreas that is not well visualized. 3. Findings concerning for malignancy, please see separately dictated CT abdomen pelvis 01/13/2020.   01/13/2020 Imaging   CT AP 01/13/20  IMPRESSION: 1. Hypoenhancing 2.0 cm in long axis mass in the pancreatic tail compatible with pancreatic adenocarcinoma. The mass abuts the posterior gastric wall but without definite gastric invasion. The splenic vein appears narrowed/attenuated compared to prior exams and the lesion abuts the margin of the splenic artery. 2. Hypoenhancing hepatic masses are new compared to 11/22/2017 and are highly suspicious for metastatic lesions. 3. Questionable 0.8 by 0.8 cm omental tumor nodule versus dense diverticulum above the transverse colon. 4. Other imaging findings of potential clinical significance: Mild distal esophageal wall thickening, query esophagitis. Stable left adrenal adenoma. Sigmoid colon diverticulosis. Lumbar and thoracic spondylosis and degenerative disc disease  contributing to multilevel impingement. 5. Aortic atherosclerosis. Aortic Atherosclerosis (ICD10-I70.0).   01/21/2020 Initial Diagnosis   [place] ([facility]   01/28/2020 Initial Biopsy   FINAL MICROSCOPIC DIAGNOSIS:   A. LIVER, NEEDLE CORE BIOPSY:  - Adenocarcinoma.   COMMENT:   Immunohistochemistry for CK7 is positive.  CDX-2 demonstrates weak to  moderate positive staining.  TTF-1 and PAX 8 demonstrate weak, likely  nonspecific staining.  CK20, GATA-3 and ER are negative.  The  morphologic and immunophenotypic characteristics are compatible with the  clinical impression of a primary pancreatic cancer.  Radiologic  correlation is encouraged.  If applicable, there is likely sufficient  tissue for ancillary studies (Block A2).  Dr. KVic Ripperreviewed the  case.    02/26/2020 - 05/19/2020 Chemotherapy   First-line Gemcitabine and Abraxane every 2 weeks starting 02/26/20. D/c after 05/19/20 due to disease progression in liver and neuropathy     02/26/2020 Cancer Staging   Staging form: Exocrine Pancreas, AJCC 8th Edition - Clinical: Stage IV (cT1c, cN0, pM1) - Signed by FTruitt Merle MD on 02/26/2020 Total positive nodes: 0    03/08/2020 Genetic Testing   Negative hereditary cancer genetic testing: no pathogenic variants detected in [employer] Common Hereditary Cancers Panel with preliminary evidence pancreatic cancer genes.  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 The report date is March 08, 2020.    The Common Hereditary Cancers Panel with preliminary  evidence pancreatic cancer genes offered by [facility] includes sequencing and/or deletion duplication testing of the following 49 genes: APC, ATM, AXIN2, BARD1, BMPR1A, BRCA1, BRCA2, BRIP1, CDH1, CDK4, CDKN2A (p14ARF), CDKN2A (p16INK4a), CHEK2, CTNNA1, DICER1, EPCAM (Deletion/duplication testing only), GREM1 (promoter region deletion/duplication testing only), FANCC, GREM1, HOXB13, KIT, MEN1, MLH1, MSH2,  MSH3, MSH6, MUTYH, NBN, NF1, NHTL1, PALB2, PALLD, PDGFRA, PMS2, POLD1, POLE, PTEN, RAD50, RAD51C, RAD51D, SDHA, SDHB, SDHC, SDHD, SMAD4, SMARCA4. STK11, TP53, TSC1, TSC2, and VHL.  The following genes were evaluated for sequence changes only: SDHA and HOXB13 c.251G>A variant only.es only: SDHA and HOXB13 c.251G>A variant only.   05/03/2020 Imaging   CT AP  IMPRESSION: 1. Interval decrease in size of a hypodense mass of the central pancreatic tail, consistent with treatment response of primary pancreatic malignancy. 2. Numerous low-attenuation liver lesions are seen, increased in size and number compared to prior examination. Findings are consistent with worsened hepatic metastatic disease. 3. A previously queried omental nodule adjacent to the transverse colon is more clearly a prominent diverticulum on current examination.   Aortic Atherosclerosis (ICD10-I70.0).   06/02/2020 -  Chemotherapy   Second-line FOLFIRI q2weeks starting 06/02/20    12/15/2020 - 02/18/2021 Chemotherapy   Patient is on Treatment Plan : PANCREAS FOLFOX q14d     02/28/2021 Imaging   CT CAP IMPRESSION: 1. Progressive metastatic disease, with substantial enlargement of the multilobular dominant hepatic metastatic lesion, as well as scattered pulmonary nodules which are increasing in size. 2. Other imaging findings of potential clinical significance: Aortic Atherosclerosis (ICD10-I70.0). Coronary atherosclerosis. Peripheral atherosclerosis. Fatty prominence of the interatrial septum. Possible esophagitis. Emphysema (ICD10-J43.9). Left adrenal adenoma. Atrophic pancreatic tail. Sigmoid colon diverticulosis. Mild multilevel lumbar impingement. No discrete splenic mass today.   03/09/2021 -  Chemotherapy   Patient is on Treatment Plan : PANCREAS Liposomal Irinotecan / Leucovorin / 5-FU  q14d        INTERVAL HISTORY: Mariah Henderson is here for a follow up of metastatic pancreatic cancer. She was last seen by NP Lacie on  03/02/21. She was seen in the infusion area. She reports she is having pain today. She also reports she has no appetite.   All other systems were reviewed with the patient and are negative.  MEDICAL HISTORY:  Past Medical History:  Diagnosis Date   Acute medial meniscus tear    right knee   Anxiety    Asthma    Cervical spondylosis    Chronic serous otitis media    Constipation    COPD (chronic obstructive pulmonary disease) (HCC)    Depression    followed by [name]; no longer of depakote, seroquel   Diabetes mellitus without complication ([place])    Dysuria 12/27/2009   Qualifier: Diagnosis of  By: [name] MD, Amanjot     Foot drop    GERD (gastroesophageal reflux disease)    Heart murmur    Hyperlipidemia    Hypertension    Low back pain    Obesity    pancreatic ca with liver mets 38/7564   Paraumbilical hernia    Pre-diabetes     SURGICAL HISTORY: Past Surgical History:  Procedure Laterality Date   ABDOMINAL HYSTERECTOMY     ANTERIOR CERVICAL DECOMP/DISCECTOMY FUSION N/A 10/05/2014   Procedure: ACDF - C5-C6 - C6-C7;  Surgeon: Karie Chimera, MD;  Location: [facility];  Service: Neurosurgery;  Laterality: N/A;  ACDF - C5-C6 - C6-C7   CHOLECYSTECTOMY     COLONOSCOPY     FOOT SURGERY  HERNIA REPAIR  1999   IR IMAGING GUIDED PORT INSERTION  02/25/2020   IR US GUIDANCE  01/28/2020   KNEE ARTHROSCOPY WITH MEDIAL MENISECTOMY Right 06/14/2016   Procedure: RIGHT KNEE ARTHROSCOPY WITH PARTIAL MEDIAL MENISCECTOMY, SUBCHONDROPLASTY;  Surgeon: Leandrew Koyanagi, MD;  Location: [place] [facility];  Service: Orthopedics;  Laterality: Right;   KNEE ARTHROSCOPY WITH SUBCHONDROPLASTY Right 06/14/2016   Procedure: KNEE ARTHROSCOPY WITH SUBCHONDROPLASTY;  Surgeon: Leandrew Koyanagi, MD;  Location: [place];  Service: Orthopedics;  Laterality: Right;   RADIOACTIVE SEED GUIDED EXCISIONAL BREAST BIOPSY Left 06/23/2014   Procedure: RADIOACTIVE SEED GUIDED EXCISIONAL BREAST  BIOPSY;  Surgeon: Stark Klein, MD;  Location: [place];  Service: General;  Laterality: Left;    I have reviewed the social history and family history with the patient and they are unchanged from previous note.  ALLERGIES:  has No Known Allergies.  MEDICATIONS:  Current Outpatient Medications  Medication Sig Dispense Refill   alendronate (FOSAMAX) 70 MG tablet TAKE 1 TABLET BY MOUTH  EVERY 7 DAYS WITH A FULL  GLASS OF WATER ON AN EMPTY  STOMACH 12 tablet 3   Accu-Chek Softclix Lancets lancets Check blood sugar once a day as instructed 102 each 3   albuterol (PROVENTIL) (2.5 MG/3ML) 0.083% nebulizer solution Take 3 mLs (2.5 mg total) by nebulization every 6 (six) hours as needed for wheezing. 1080 mL 3   albuterol (VENTOLIN HFA) 108 (90 Base) MCG/ACT inhaler INHALE 1 TO 2 PUFFS INTO THE LUNGS EVERY 6 HOURS AS NEEDED FOR WHEEZING OR SHORTNESS OF BREATH 54 g 1   atorvastatin (LIPITOR) 40 MG tablet TAKE 1 TABLET BY MOUTH EVERY DAY 90 tablet 3   benazepril (LOTENSIN) 20 MG tablet Take 1 tablet (20 mg total) by mouth daily. 90 tablet 3   Blood Glucose Monitoring Suppl (ONETOUCH VERIO REFLECT) w/Device KIT Check blood sugar once a day as instructed 1 kit 0   celecoxib (CELEBREX) 100 MG capsule TAKE 1 CAPSULE BY MOUTH  TWICE DAILY 180 capsule 3   cholecalciferol (VITAMIN D) 1000 units tablet Take 2,000 Units by mouth daily.     diclofenac Sodium (VOLTAREN) 1 % GEL Apply 2 g topically 4 (four) times daily. 100 g 2   diphenoxylate-atropine (LOMOTIL) 2.5-0.025 MG tablet Take 1 tablet by mouth 4 (four) times daily as needed for diarrhea or loose stools. 30 tablet 0   famotidine (PEPCID) 40 MG tablet TAKE 1/2 TABLET(20 MG) BY MOUTH TWICE DAILY 30 tablet 3   fluticasone (FLONASE) 50 MCG/ACT nasal spray Place 1 spray into both nostrils daily. 16 g 3   Fluticasone-Salmeterol (ADVAIR DISKUS) 100-50 MCG/DOSE AEPB Inhale 1 puff into the lungs 2 (two) times daily. 60 each 11   furosemide  (LASIX) 20 MG tablet TAKE 1 TABLET BY MOUTH EVERY DAY, ON DAY OF CHEMO AND WHILE USING 5FU INFUSING THEN TAKE 1/2 TABLET BY MOUTH DAILY 30 tablet 1   gabapentin (NEURONTIN) 100 MG capsule TAKE 1 CAPSULE(100 MG) BY MOUTH THREE TIMES DAILY 90 capsule 1   gabapentin (NEURONTIN) 300 MG capsule TAKE 1 CAPSULE(300 MG) BY MOUTH TWICE DAILY 60 capsule 3   gabapentin (NEURONTIN) 800 MG tablet Take 800 mg by mouth daily.     glucose blood (ONETOUCH VERIO) test strip Check blood sugar once a day as instructed 100 each 3   ipratropium (ATROVENT) 0.02 % nebulizer solution Take 0.5 mg by nebulization every 6 (six) hours as needed for wheezing or shortness of breath.  latanoprost (XALATAN) 0.005 % ophthalmic solution PLACE 1 DROP INTO BOTH EYES AT BEDTIME  0   loperamide (LOPERAMIDE A-D) 2 MG tablet Take 1 tablet (2 mg total) by mouth 2 (two) times daily as needed for diarrhea or loose stools. 6 tablet 0   loratadine (CLARITIN) 10 MG tablet Take 1 tablet (10 mg total) by mouth daily. 30 tablet 11   magic mouthwash w/lidocaine SOLN Take 5 mLs by mouth 4 (four) times daily as needed for mouth pain. 480 mL 2   meclizine (ANTIVERT) 12.5 MG tablet TAKE 1 TABLET(12.5 MG) BY MOUTH TWICE DAILY AS NEEDED FOR DIZZINESS 15 tablet 3   Oxycodone HCl 10 MG TABS Take 1 tablet (10 mg total) by mouth every 4 (four) hours as needed. 60 tablet 0   potassium chloride SA (KLOR-CON M) 20 MEQ tablet TAKE 1 TABLET BY MOUTH EVERY DAY 30 tablet 0   prochlorperazine (COMPAZINE) 10 MG tablet TAKE 1 TABLET(10 MG) BY MOUTH EVERY 6 HOURS AS NEEDED FOR NAUSEA OR VOMITING 30 tablet 3   risperiDONE (RISPERDAL) 0.25 MG tablet Take 0.25 mg by mouth daily as needed.     rivaroxaban (XARELTO) 20 MG TABS tablet Take 1 tablet (20 mg total) by mouth daily with supper. 30 tablet 3   sertraline (ZOLOFT) 50 MG tablet Take 50 mg by mouth daily.     traMADol (ULTRAM) 50 MG tablet Take 1 tablet (50 mg total) by mouth every 8 (eight) hours as needed. 30  tablet 0   traZODone (DESYREL) 50 MG tablet Take 25-50 mg by mouth at bedtime as needed for sleep.      triamcinolone cream (KENALOG) 0.1 % apply to affected area twice a day 454 g 1   No current facility-administered medications for this visit.    PHYSICAL EXAMINATION: ECOG PERFORMANCE STATUS: 2 - Symptomatic, <50% confined to bed  There were no vitals filed for this visit. Wt Readings from Last 3 Encounters:  03/23/21 (P) 116 lb 12 oz (53 kg)  03/09/21 122 lb 4 oz (55.5 kg)  03/02/21 126 lb 11.2 oz (57.5 kg)     GENERAL:alert, no distress and comfortable SKIN: skin color normal, no rashes or significant lesions EYES: normal, Conjunctiva are pink and non-injected, sclera clear  HEART: stable lower extremity edema NEURO: alert & oriented x 3 with fluent speech  LABORATORY DATA:  I have reviewed the data as listed CBC Latest Ref Rng & Units 03/23/2021 03/09/2021 03/02/2021  WBC 4.0 - 10.5 K/uL 7.9 6.9 7.3  Hemoglobin 12.0 - 15.0 g/dL 9.1(L) 9.6(L) 9.2(L)  Hematocrit 36.0 - 46.0 % 30.0(L) 31.5(L) 30.4(L)  Platelets 150 - 400 K/uL 222 203 189     CMP Latest Ref Rng & Units 03/23/2021 03/09/2021 03/02/2021  Glucose 70 - 99 mg/dL 282(H) 207(H) 269(H)  BUN 8 - 23 mg/dL 8 7(L) 7(L)  Creatinine 0.44 - 1.00 mg/dL 0.50 0.53 0.61  Sodium 135 - 145 mmol/L 135 136 137  Potassium 3.5 - 5.1 mmol/L 3.6 4.2 3.7  Chloride 98 - 111 mmol/L 98 100 103  CO2 22 - 32 mmol/L 33(H) 32 28  Calcium 8.9 - 10.3 mg/dL 9.6 9.4 8.7(L)  Total Protein 6.5 - 8.1 g/dL 6.5 7.1 6.8  Total Bilirubin 0.3 - 1.2 mg/dL 1.1 0.8 0.4  Alkaline Phos 38 - 126 U/L 161(H) 162(H) 147(H)  AST 15 - 41 U/L 31 32 33  ALT 0 - 44 U/L '27 22 24      ' RADIOGRAPHIC STUDIES: I have  personally reviewed the radiological images as listed and agreed with the findings in the report. No results found.    No orders of the defined types were placed in this encounter.  All questions were answered. The patient knows to call the clinic with  any problems, questions or concerns. No barriers to learning was detected. The total time spent in the appointment was 40 minutes.     Truitt Merle, MD 03/23/2021   I, Wilburn Mylar, am acting as scribe for Truitt Merle, MD.   I have reviewed the above documentation for accuracy and completeness, and I agree with the above.

## 2021-03-25 ENCOUNTER — Ambulatory Visit (HOSPITAL_COMMUNITY)
Admission: RE | Admit: 2021-03-25 | Discharge: 2021-03-25 | Disposition: A | Discharge status: Home / Self Care | Payer: Medicare Other | Source: Ambulatory Visit | Attending: Internal Medicine | Admitting: Internal Medicine

## 2021-03-25 ENCOUNTER — Other Ambulatory Visit: Payer: Self-pay

## 2021-03-25 ENCOUNTER — Telehealth: Payer: Self-pay

## 2021-03-25 ENCOUNTER — Inpatient Hospital Stay: Payer: Medicare Other

## 2021-03-25 DIAGNOSIS — E785 Hyperlipidemia, unspecified: Secondary | ICD-10-CM | POA: Diagnosis not present

## 2021-03-25 DIAGNOSIS — I509 Heart failure, unspecified: Secondary | ICD-10-CM | POA: Insufficient documentation

## 2021-03-25 DIAGNOSIS — J449 Chronic obstructive pulmonary disease, unspecified: Secondary | ICD-10-CM | POA: Insufficient documentation

## 2021-03-25 DIAGNOSIS — E119 Type 2 diabetes mellitus without complications: Secondary | ICD-10-CM | POA: Insufficient documentation

## 2021-03-25 DIAGNOSIS — Z5111 Encounter for antineoplastic chemotherapy: Secondary | ICD-10-CM | POA: Diagnosis not present

## 2021-03-25 DIAGNOSIS — Z452 Encounter for adjustment and management of vascular access device: Secondary | ICD-10-CM | POA: Diagnosis not present

## 2021-03-25 DIAGNOSIS — I110 Hypertensive heart disease with heart failure: Secondary | ICD-10-CM | POA: Diagnosis not present

## 2021-03-25 DIAGNOSIS — R600 Localized edema: Secondary | ICD-10-CM | POA: Diagnosis not present

## 2021-03-25 DIAGNOSIS — C787 Secondary malignant neoplasm of liver and intrahepatic bile duct: Secondary | ICD-10-CM | POA: Diagnosis not present

## 2021-03-25 DIAGNOSIS — Z95828 Presence of other vascular implants and grafts: Secondary | ICD-10-CM

## 2021-03-25 DIAGNOSIS — C252 Malignant neoplasm of tail of pancreas: Secondary | ICD-10-CM | POA: Diagnosis not present

## 2021-03-25 LAB — ECHOCARDIOGRAM COMPLETE
AR max vel: 2.07 cm2
AV Area VTI: 2.03 cm2
AV Area mean vel: 2.02 cm2
AV Mean grad: 4 mmHg
AV Peak grad: 7.1 mmHg
Ao pk vel: 1.33 m/s
Area-P 1/2: 2.8 cm2
S' Lateral: 2 cm

## 2021-03-25 MED ORDER — SODIUM CHLORIDE 0.9% FLUSH
10.0000 mL | Freq: Once | INTRAVENOUS | Status: AC
  Administered 2021-03-25: 10 mL

## 2021-03-25 MED ORDER — HEPARIN SOD (PORK) LOCK FLUSH 100 UNIT/ML IV SOLN
500.0000 [IU] | Freq: Once | INTRAVENOUS | Status: AC
  Administered 2021-03-25: 500 [IU]

## 2021-03-25 NOTE — Telephone Encounter (Signed)
Attempted to contact patient's niece [name] to schedule a Palliative Care consult appointment. No answer left a message to return call.  ?

## 2021-03-28 ENCOUNTER — Encounter: Payer: Self-pay | Admitting: Hematology

## 2021-03-28 ENCOUNTER — Telehealth: Payer: Self-pay

## 2021-03-28 NOTE — Telephone Encounter (Signed)
Spoke with patient's niece [name] and scheduled a telephonic Palliative Consult for 04/04/21 @ 1:30 PM.  ? ?Consent obtained; updated Outlook/Netsmart/Team List and Epic.  ? ? ?

## 2021-03-30 ENCOUNTER — Inpatient Hospital Stay: Payer: Medicare Other | Admitting: Nurse Practitioner

## 2021-04-04 ENCOUNTER — Other Ambulatory Visit: Payer: Self-pay

## 2021-04-04 ENCOUNTER — Other Ambulatory Visit: Payer: Medicare Other | Admitting: Internal Medicine

## 2021-04-04 ENCOUNTER — Encounter (HOSPITAL_COMMUNITY): Payer: Self-pay | Admitting: Hematology

## 2021-04-04 DIAGNOSIS — G620 Drug-induced polyneuropathy: Secondary | ICD-10-CM

## 2021-04-04 DIAGNOSIS — E43 Unspecified severe protein-calorie malnutrition: Secondary | ICD-10-CM | POA: Diagnosis not present

## 2021-04-04 DIAGNOSIS — C7800 Secondary malignant neoplasm of unspecified lung: Secondary | ICD-10-CM | POA: Diagnosis not present

## 2021-04-04 DIAGNOSIS — Z515 Encounter for palliative care: Secondary | ICD-10-CM | POA: Diagnosis not present

## 2021-04-04 DIAGNOSIS — C252 Malignant neoplasm of tail of pancreas: Secondary | ICD-10-CM | POA: Diagnosis not present

## 2021-04-04 DIAGNOSIS — C259 Malignant neoplasm of pancreas, unspecified: Secondary | ICD-10-CM | POA: Diagnosis not present

## 2021-04-04 DIAGNOSIS — T451X5A Adverse effect of antineoplastic and immunosuppressive drugs, initial encounter: Secondary | ICD-10-CM | POA: Diagnosis not present
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
## 2021-04-05 NOTE — Progress Notes (Signed)
?[place]   ?[phone]) [phone] [phone]   ?Clinic Follow up Note  ? ?Patient Care Team: ?Sid Falcon, MD as PCP - General (Internal Medicine) ?Forrest Moron, OD (Optometry) ?Jonnie Finner, RN (Inactive) as Oncology Nurse Navigator ?Truitt Merle, MD as Consulting Physician (Oncology) ?Gwyndolyn Kaufman, RN (Inactive) as Registered Nurse ?04/06/2021 ? ?CHIEF COMPLAINT: Follow up pancreas cancer  ? ?SUMMARY OF ONCOLOGIC HISTORY: ?Oncology History Overview Note  ?Cancer Staging ?Pancreatic cancer ([place]) ?Staging form: Exocrine Pancreas, AJCC 8th Edition ?- Clinical: Stage IV (cT1c, cN0, pM1) - Signed by Truitt Merle, MD on 02/26/2020 ?Total positive nodes: 0 ? ?  ?Pancreatic cancer ([place])  ?01/13/2020 Imaging  ? US Abdomen 01/13/20  ?IMPRESSION: ?1. Suggestion of 3 vascular hypoechoic mass within the liver ?measuring up to 2.5 cm. ?2. Suggestion of a hypoechoic mass within the tail of the pancreas ?that is not well visualized. ?3. Findings concerning for malignancy, please see separately ?dictated CT abdomen pelvis 01/13/2020. ?  ?01/13/2020 Imaging  ? CT AP 01/13/20  ?IMPRESSION: ?1. Hypoenhancing 2.0 cm in long axis mass in the pancreatic tail ?compatible with pancreatic adenocarcinoma. The mass abuts the ?posterior gastric wall but without definite gastric invasion. The ?splenic vein appears narrowed/attenuated compared to prior exams and ?the lesion abuts the margin of the splenic artery. ?2. Hypoenhancing hepatic masses are new compared to 11/22/2017 and ?are highly suspicious for metastatic lesions. ?3. Questionable 0.8 by 0.8 cm omental tumor nodule versus dense ?diverticulum above the transverse colon. ?4. Other imaging findings of potential clinical significance: Mild ?distal esophageal wall thickening, query esophagitis. Stable left ?adrenal adenoma. Sigmoid colon diverticulosis. Lumbar and thoracic ?spondylosis and degenerative disc disease contributing to  multilevel ?impingement. ?5. Aortic atherosclerosis. ?Aortic Atherosclerosis (ICD10-I70.0). ?  ?01/21/2020 Initial Diagnosis  ? Pancreatic cancer [facility]) ?  ?01/28/2020 Initial Biopsy  ? FINAL MICROSCOPIC DIAGNOSIS:  ? ?A. LIVER, NEEDLE CORE BIOPSY:  ?- Adenocarcinoma.  ? ?COMMENT:  ? ?Immunohistochemistry for CK7 is positive.  CDX-2 demonstrates weak to  ?moderate positive staining.  TTF-1 and PAX 8 demonstrate weak, likely  ?nonspecific staining.  CK20, GATA-3 and ER are negative.  The  ?morphologic and immunophenotypic characteristics are compatible with the  ?clinical impression of a primary pancreatic cancer.  Radiologic  ?correlation is encouraged.  If applicable, there is likely sufficient  ?tissue for ancillary studies (Block A2).  Dr. Vic Ripper reviewed the  ?case.  ?  ?02/26/2020 - 05/19/2020 Chemotherapy  ? First-line Gemcitabine and Abraxane every 2 weeks starting 02/26/20. D/c after 05/19/20 due to disease progression in liver and neuropathy  ? ?  ?02/26/2020 Cancer Staging  ? Staging form: Exocrine Pancreas, AJCC 8th Edition ?- Clinical: Stage IV (cT1c, cN0, pM1) - Signed by Truitt Merle, MD on 02/26/2020 ?Total positive nodes: 0 ? ?  ?03/08/2020 Genetic Testing  ? Negative hereditary cancer genetic testing: no pathogenic variants detected in [facility] Common Hereditary Cancers Panel with preliminary evidence pancreatic cancer genes.  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 The report date is March 08, 2020.   ? ?The Common Hereditary Cancers Panel with preliminary evidence pancreatic cancer genes offered by [facility] includes sequencing and/or deletion duplication testing of the following 49 genes: APC, ATM, AXIN2, BARD1, BMPR1A, BRCA1, BRCA2, BRIP1, CDH1, CDK4, CDKN2A (p14ARF), CDKN2A (p16INK4a), CHEK2, CTNNA1, DICER1, EPCAM (Deletion/duplication testing only), GREM1 (promoter region deletion/duplication testing only), FANCC, GREM1, HOXB13, KIT, MEN1, MLH1, MSH2, MSH3, MSH6, MUTYH,  NBN, NF1, NHTL1, PALB2, PALLD, PDGFRA, PMS2, POLD1,  POLE, PTEN, RAD50, RAD51C, RAD51D, SDHA, SDHB, SDHC, SDHD, SMAD4, SMARCA4. STK11, TP53, TSC1, TSC2, and VHL.  The following genes were evaluated for sequence changes only: SDHA and HOXB13 c.251G>A variant only.es only: SDHA and HOXB13 c.251G>A variant only. ?  ?05/03/2020 Imaging  ? CT AP  ?IMPRESSION: ?1. Interval decrease in size of a hypodense mass of the central ?pancreatic tail, consistent with treatment response of primary ?pancreatic malignancy. ?2. Numerous low-attenuation liver lesions are seen, increased in ?size and number compared to prior examination. Findings are ?consistent with worsened hepatic metastatic disease. ?3. A previously queried omental nodule adjacent to the transverse ?colon is more clearly a prominent diverticulum on current ?examination. ?  ?Aortic Atherosclerosis (ICD10-I70.0). ?  ?06/02/2020 -  Chemotherapy  ? Second-line FOLFIRI q2weeks starting 06/02/20 ? ?  ?12/15/2020 - 02/18/2021 Chemotherapy  ? Patient is on Treatment Plan : PANCREAS FOLFOX q14d  ?   ?02/28/2021 Imaging  ? CT CAP IMPRESSION: ?1. Progressive metastatic disease, with substantial enlargement of the multilobular dominant hepatic metastatic lesion, as well as scattered pulmonary nodules which are increasing in size. ?2. Other imaging findings of potential clinical significance: Aortic Atherosclerosis (ICD10-I70.0). Coronary atherosclerosis. Peripheral atherosclerosis. Fatty prominence of the interatrial septum. Possible esophagitis. Emphysema (ICD10-J43.9). Left adrenal adenoma. Atrophic pancreatic tail. Sigmoid colon diverticulosis. Mild multilevel lumbar impingement. No discrete splenic mass today. ?  ?03/09/2021 -  Chemotherapy  ? Patient is on Treatment Plan : PANCREAS Liposomal Irinotecan / Leucovorin / 5-FU  q14d  ?   ? ? ?CURRENT THERAPY: Fourth line liposomal irinotecan and 5FU, q2 weeks, starting 03/09/21 ? ?INTERVAL HISTORY: Ms. Tortorelli returns for follow up as  scheduled. Last seen by Dr. Burr Medico 03/23/21 and completed cycle 2 liposomal irinotecan (dose reduced) and 5FU.  She tolerates treatment well overall, no significant side effects.  She had a phone visit with palliative care provider this week, a walker and bedside commode have been ordered.  The plan is to meet in person and have her son included on the phone on 4/11.  The carpet was being changed in her apartment, she tripped over a bulky piece and fell forward, scraped her nose and feels sore.  Denies hearing a crack in any ribs or other significant pain. Denies bleeding.  Right upper quadrant pain worsened at her last visit, now takes oxycodone every 4-6 hours which is managing her pain.  She does have difficulty getting comfortable when sitting.  Pain is a 5 out of 10 today.  Oxycodone makes her sleepy, she is in the bed more, and therefore is getting weaker.  However she is able to get up when she wants to.  Bowels moving with stool softener, although she is not eating much.  She has lost weight by our measurement but her niece [name] feels she has gained a pound this week.  Does 2 nutrition supplements per day. She feels warmth and "fever" in her left leg through her clothes at night. Denies calf pain, redness, rash. She continues xarelto.  ? ? ?MEDICAL HISTORY:  ?Past Medical History:  ?Diagnosis Date  ? Acute medial meniscus tear   ? right knee  ? Anxiety   ? Asthma   ? Cervical spondylosis   ? Chronic serous otitis media   ? Constipation   ? COPD (chronic obstructive pulmonary disease) ([place])   ? Depression   ? followed by [name]; no longer of depakote, seroquel  ? Diabetes mellitus without complication ([place])   ? Dysuria 12/27/2009  ? Qualifier:  Diagnosis of  By: [name] MD, [place]    ? Foot drop   ? GERD (gastroesophageal reflux disease)   ? Heart murmur   ? Hyperlipidemia   ? Hypertension   ? Low back pain   ? Obesity   ? pancreatic ca with liver mets 02/2020  ? Paraumbilical hernia   ? Pre-diabetes    ? ? ?SURGICAL HISTORY: ?Past Surgical History:  ?Procedure Laterality Date  ? ABDOMINAL HYSTERECTOMY    ? ANTERIOR CERVICAL DECOMP/DISCECTOMY FUSION N/A 10/05/2014  ? Procedure: ACDF - C5-C6 - C6-C7;  Surgeon: Karie Chimera

## 2021-04-06 ENCOUNTER — Other Ambulatory Visit: Payer: Self-pay

## 2021-04-06 ENCOUNTER — Other Ambulatory Visit (HOSPITAL_COMMUNITY): Payer: Self-pay

## 2021-04-06 ENCOUNTER — Encounter: Payer: Self-pay | Admitting: Hematology

## 2021-04-06 ENCOUNTER — Encounter: Payer: Self-pay | Admitting: Nurse Practitioner

## 2021-04-06 ENCOUNTER — Inpatient Hospital Stay (HOSPITAL_BASED_OUTPATIENT_CLINIC_OR_DEPARTMENT_OTHER): Payer: Medicare Other | Admitting: Nurse Practitioner

## 2021-04-06 ENCOUNTER — Telehealth: Payer: Self-pay | Admitting: Nurse Practitioner

## 2021-04-06 ENCOUNTER — Inpatient Hospital Stay: Payer: Medicare Other

## 2021-04-06 ENCOUNTER — Other Ambulatory Visit: Payer: Self-pay | Admitting: Hematology

## 2021-04-06 ENCOUNTER — Telehealth: Payer: Self-pay | Admitting: Pharmacist

## 2021-04-06 ENCOUNTER — Inpatient Hospital Stay: Payer: Medicare Other | Admitting: Dietician

## 2021-04-06 ENCOUNTER — Telehealth: Payer: Self-pay | Admitting: Internal Medicine

## 2021-04-06 ENCOUNTER — Telehealth: Payer: Self-pay

## 2021-04-06 VITALS — BP 119/67 | HR 105 | Temp 97.9°F | Resp 16 | Ht 60.0 in | Wt 117.6 lb

## 2021-04-06 DIAGNOSIS — G620 Drug-induced polyneuropathy: Secondary | ICD-10-CM

## 2021-04-06 DIAGNOSIS — C787 Secondary malignant neoplasm of liver and intrahepatic bile duct: Secondary | ICD-10-CM | POA: Diagnosis not present

## 2021-04-06 DIAGNOSIS — Z95828 Presence of other vascular implants and grafts: Secondary | ICD-10-CM

## 2021-04-06 DIAGNOSIS — K5900 Constipation, unspecified: Secondary | ICD-10-CM

## 2021-04-06 DIAGNOSIS — M6281 Muscle weakness (generalized): Secondary | ICD-10-CM | POA: Diagnosis not present

## 2021-04-06 DIAGNOSIS — C259 Malignant neoplasm of pancreas, unspecified: Secondary | ICD-10-CM

## 2021-04-06 DIAGNOSIS — R530 Neoplastic (malignant) related fatigue: Secondary | ICD-10-CM

## 2021-04-06 DIAGNOSIS — G893 Neoplasm related pain (acute) (chronic): Secondary | ICD-10-CM | POA: Diagnosis not present

## 2021-04-06 DIAGNOSIS — Z515 Encounter for palliative care: Secondary | ICD-10-CM | POA: Diagnosis not present

## 2021-04-06 DIAGNOSIS — Z9181 History of falling: Secondary | ICD-10-CM | POA: Diagnosis not present

## 2021-04-06 DIAGNOSIS — Z452 Encounter for adjustment and management of vascular access device: Secondary | ICD-10-CM | POA: Diagnosis not present

## 2021-04-06 DIAGNOSIS — C252 Malignant neoplasm of tail of pancreas: Secondary | ICD-10-CM

## 2021-04-06 DIAGNOSIS — Z7189 Other specified counseling: Secondary | ICD-10-CM | POA: Diagnosis not present

## 2021-04-06 DIAGNOSIS — R634 Abnormal weight loss: Secondary | ICD-10-CM

## 2021-04-06 DIAGNOSIS — Z5111 Encounter for antineoplastic chemotherapy: Secondary | ICD-10-CM | POA: Diagnosis not present

## 2021-04-06 DIAGNOSIS — R630 Anorexia: Secondary | ICD-10-CM

## 2021-04-06 DIAGNOSIS — R262 Difficulty in walking, not elsewhere classified: Secondary | ICD-10-CM | POA: Diagnosis not present

## 2021-04-06 DIAGNOSIS — R531 Weakness: Secondary | ICD-10-CM

## 2021-04-06 DIAGNOSIS — C253 Malignant neoplasm of pancreatic duct: Secondary | ICD-10-CM | POA: Diagnosis not present

## 2021-04-06 LAB — CMP (CANCER CENTER ONLY)
ALT: 41 U/L (ref 0–44)
AST: 37 U/L (ref 15–41)
Albumin: 2.9 g/dL — ABNORMAL LOW (ref 3.5–5.0)
Alkaline Phosphatase: 203 U/L — ABNORMAL HIGH (ref 38–126)
Anion gap: 7 (ref 5–15)
BUN: 14 mg/dL (ref 8–23)
CO2: 33 mmol/L — ABNORMAL HIGH (ref 22–32)
Calcium: 9.8 mg/dL (ref 8.9–10.3)
Chloride: 96 mmol/L — ABNORMAL LOW (ref 98–111)
Creatinine: 0.53 mg/dL (ref 0.44–1.00)
GFR, Estimated: >60 mL/min (ref >60)
Glucose, Bld: 227 mg/dL — ABNORMAL HIGH (ref 70–99)
Potassium: 3.8 mmol/L (ref 3.5–5.1)
Sodium: 136 mmol/L (ref 135–145)
Total Bilirubin: 1.8 mg/dL — ABNORMAL HIGH (ref 0.3–1.2)
Total Protein: 6.2 g/dL — ABNORMAL LOW (ref 6.5–8.1)

## 2021-04-06 LAB — CBC WITH DIFFERENTIAL (CANCER CENTER ONLY)
Abs Immature Granulocytes: 0.05 10*3/uL (ref 0.00–0.07)
Basophils Absolute: 0 10*3/uL (ref 0.0–0.1)
Basophils Relative: 0 %
Eosinophils Absolute: 0.2 10*3/uL (ref 0.0–0.5)
Eosinophils Relative: 1 %
HCT: 34.8 % — ABNORMAL LOW (ref 36.0–46.0)
Hemoglobin: 10.9 g/dL — ABNORMAL LOW (ref 12.0–15.0)
Immature Granulocytes: 0 %
Lymphocytes Relative: 2 %
Lymphs Abs: 0.2 10*3/uL — ABNORMAL LOW (ref 0.7–4.0)
MCH: 24.2 pg — ABNORMAL LOW (ref 26.0–34.0)
MCHC: 31.3 g/dL (ref 30.0–36.0)
MCV: 77.2 fL — ABNORMAL LOW (ref 80.0–100.0)
Monocytes Absolute: 1 10*3/uL (ref 0.1–1.0)
Monocytes Relative: 8 %
Neutro Abs: 11.4 10*3/uL — ABNORMAL HIGH (ref 1.7–7.7)
Neutrophils Relative %: 89 %
Platelet Count: 264 10*3/uL (ref 150–400)
RBC: 4.51 MIL/uL (ref 3.87–5.11)
RDW: 27.3 % — ABNORMAL HIGH (ref 11.5–15.5)
WBC Count: 12.9 10*3/uL — ABNORMAL HIGH (ref 4.0–10.5)
nRBC: 0 % (ref 0.0–0.2)

## 2021-04-06 MED ORDER — HEPARIN SOD (PORK) LOCK FLUSH 100 UNIT/ML IV SOLN
500.0000 [IU] | Freq: Once | INTRAVENOUS | Status: AC
  Administered 2021-04-06: 500 [IU]

## 2021-04-06 MED ORDER — SODIUM CHLORIDE 0.9% FLUSH
10.0000 mL | Freq: Once | INTRAVENOUS | Status: AC
  Administered 2021-04-06: 10 mL

## 2021-04-06 MED ORDER — MORPHINE SULFATE ER 15 MG PO TBCR: 15.0000 mg | EXTENDED_RELEASE_TABLET | Freq: Two times a day (BID) | ORAL | 0 refills | Status: AC

## 2021-04-06 MED ORDER — OLAPARIB 150 MG PO TABS
300.0000 mg | ORAL_TABLET | Freq: Two times a day (BID) | ORAL | 1 refills | Status: DC
  Filled 2021-04-06: qty 120, 30d supply, fill #0

## 2021-04-06 NOTE — Patient Instructions (Signed)

## 2021-04-06 NOTE — Progress Notes (Signed)
Palliative Medicine [facility]  [phone] Fax:([phone]   Name: Mariah Henderson Date: 04/06/2021 MRN: [id number]  DOB: [birth year]  Patient Care Team: Inez Catalina, MD as PCP - General (Internal Medicine) Maryruth Hancock, OD (Optometry) Radonna Ricker, RN (Inactive) as Oncology Nurse Navigator Malachy Mood, MD as Consulting Physician (Oncology) Cynda Familia, RN (Inactive) as Registered Nurse    REASON FOR CONSULTATION: Mariah Henderson is a 71 y.o. female with medical history including stage IV metastatic pancreatic cancer with liver involvement currently on fourth line treatment due to progression.  Palliative ask to see for symptom management and goals of care. She is also connected with outpatient palliative ([facility]).    SOCIAL HISTORY:     reports that she quit smoking about 25 years ago. Her smoking use included cigarettes. She has a 10.00 pack-year smoking history. She has never used smokeless tobacco. She reports that she does not drink alcohol and does not use drugs.  ADVANCE DIRECTIVES:  Patient does not have completed documents   CODE STATUS: Pending discussions  PAST MEDICAL HISTORY: Past Medical History:  Diagnosis Date   Acute medial meniscus tear    right knee   Anxiety    Asthma    Cervical spondylosis    Chronic serous otitis media    Constipation    COPD (chronic obstructive pulmonary disease) (HCC)    Depression    followed by Dr. Allyne Gee; no longer of depakote, seroquel   Diabetes mellitus without complication (HCC)    Dysuria 12/27/2009   Qualifier: Diagnosis of  By: Baltazar Apo MD, Amanjot     Foot drop    GERD (gastroesophageal reflux disease)    Heart murmur    Hyperlipidemia    Hypertension    Low back pain    Obesity    pancreatic ca with liver mets 02/2020   Paraumbilical hernia    Pre-diabetes     PAST SURGICAL HISTORY:  Past Surgical History:  Procedure Laterality Date   ABDOMINAL  HYSTERECTOMY     ANTERIOR CERVICAL DECOMP/DISCECTOMY FUSION N/A 10/05/2014   Procedure: ACDF - C5-C6 - C6-C7;  Surgeon: Aliene Beams, MD;  Location: [facility];  Service: Neurosurgery;  Laterality: N/A;  ACDF - C5-C6 - C6-C7   CHOLECYSTECTOMY     COLONOSCOPY     FOOT SURGERY     HERNIA REPAIR  1999   IR IMAGING GUIDED PORT INSERTION  02/25/2020   IR US GUIDANCE  01/28/2020   KNEE ARTHROSCOPY WITH MEDIAL MENISECTOMY Right 06/14/2016   Procedure: RIGHT KNEE ARTHROSCOPY WITH PARTIAL MEDIAL MENISCECTOMY, SUBCHONDROPLASTY;  Surgeon: Tarry Kos, MD;  Location: [facility];  Service: Orthopedics;  Laterality: Right;   KNEE ARTHROSCOPY WITH SUBCHONDROPLASTY Right 06/14/2016   Procedure: KNEE ARTHROSCOPY WITH SUBCHONDROPLASTY;  Surgeon: Tarry Kos, MD;  Location: [facility];  Service: Orthopedics;  Laterality: Right;   RADIOACTIVE SEED GUIDED EXCISIONAL BREAST BIOPSY Left 06/23/2014   Procedure: RADIOACTIVE SEED GUIDED EXCISIONAL BREAST BIOPSY;  Surgeon: Almond Lint, MD;  Location: [facility];  Service: General;  Laterality: Left;    HEMATOLOGY/ONCOLOGY HISTORY:  Oncology History Overview Note  Cancer Staging Pancreatic cancer [facility]) Staging form: Exocrine Pancreas, AJCC 8th Edition - Clinical: Stage IV (cT1c, cN0, pM1) - Signed by Malachy Mood, MD on 02/26/2020 Total positive nodes: 0    Pancreatic cancer (HCC)  01/13/2020 Imaging   US Abdomen 01/13/20  IMPRESSION: 1. Suggestion of 3 vascular hypoechoic  mass within the liver measuring up to 2.5 cm. 2. Suggestion of a hypoechoic mass within the tail of the pancreas that is not well visualized. 3. Findings concerning for malignancy, please see separately dictated CT abdomen pelvis 01/13/2020.   01/13/2020 Imaging   CT AP 01/13/20  IMPRESSION: 1. Hypoenhancing 2.0 cm in long axis mass in the pancreatic tail compatible with pancreatic adenocarcinoma. The mass abuts the posterior gastric wall but  without definite gastric invasion. The splenic vein appears narrowed/attenuated compared to prior exams and the lesion abuts the margin of the splenic artery. 2. Hypoenhancing hepatic masses are new compared to 11/22/2017 and are highly suspicious for metastatic lesions. 3. Questionable 0.8 by 0.8 cm omental tumor nodule versus dense diverticulum above the transverse colon. 4. Other imaging findings of potential clinical significance: Mild distal esophageal wall thickening, query esophagitis. Stable left adrenal adenoma. Sigmoid colon diverticulosis. Lumbar and thoracic spondylosis and degenerative disc disease contributing to multilevel impingement. 5. Aortic atherosclerosis. Aortic Atherosclerosis (ICD10-I70.0).   01/21/2020 Initial Diagnosis   Pancreatic cancer (HCC)   01/28/2020 Initial Biopsy   FINAL MICROSCOPIC DIAGNOSIS:   A. LIVER, NEEDLE CORE BIOPSY:  - Adenocarcinoma.   COMMENT:   Immunohistochemistry for CK7 is positive.  CDX-2 demonstrates weak to  moderate positive staining.  TTF-1 and PAX 8 demonstrate weak, likely  nonspecific staining.  CK20, GATA-3 and ER are negative.  The  morphologic and immunophenotypic characteristics are compatible with the  clinical impression of a primary pancreatic cancer.  Radiologic  correlation is encouraged.  If applicable, there is likely sufficient  tissue for ancillary studies (Block A2).  Dr. Kenard Gower reviewed the  case.    02/26/2020 - 05/19/2020 Chemotherapy   First-line Gemcitabine and Abraxane every 2 weeks starting 02/26/20. D/c after 05/19/20 due to disease progression in liver and neuropathy     02/26/2020 Cancer Staging   Staging form: Exocrine Pancreas, AJCC 8th Edition - Clinical: Stage IV (cT1c, cN0, pM1) - Signed by Malachy Mood, MD on 02/26/2020 Total positive nodes: 0    03/08/2020 Genetic Testing   Negative hereditary cancer genetic testing: no pathogenic variants detected in [facility] Common Hereditary Cancers Panel with  preliminary evidence pancreatic cancer genes.  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 The report date is March 08, 2020.    The Common Hereditary Cancers Panel with preliminary evidence pancreatic cancer genes offered by [facility] includes sequencing and/or deletion duplication testing of the following 49 genes: APC, ATM, AXIN2, BARD1, BMPR1A, BRCA1, BRCA2, BRIP1, CDH1, CDK4, CDKN2A (p14ARF), CDKN2A (p16INK4a), CHEK2, CTNNA1, DICER1, EPCAM (Deletion/duplication testing only), GREM1 (promoter region deletion/duplication testing only), FANCC, GREM1, HOXB13, KIT, MEN1, MLH1, MSH2, MSH3, MSH6, MUTYH, NBN, NF1, NHTL1, PALB2, PALLD, PDGFRA, PMS2, POLD1, POLE, PTEN, RAD50, RAD51C, RAD51D, SDHA, SDHB, SDHC, SDHD, SMAD4, SMARCA4. STK11, TP53, TSC1, TSC2, and VHL.  The following genes were evaluated for sequence changes only: SDHA and HOXB13 c.251G>A variant only.es only: SDHA and HOXB13 c.251G>A variant only.   05/03/2020 Imaging   CT AP  IMPRESSION: 1. Interval decrease in size of a hypodense mass of the central pancreatic tail, consistent with treatment response of primary pancreatic malignancy. 2. Numerous low-attenuation liver lesions are seen, increased in size and number compared to prior examination. Findings are consistent with worsened hepatic metastatic disease. 3. A previously queried omental nodule adjacent to the transverse colon is more clearly a prominent diverticulum on current examination.   Aortic Atherosclerosis (ICD10-I70.0).   06/02/2020 -  Chemotherapy   Second-line FOLFIRI q2weeks  starting 06/02/20    12/15/2020 - 02/18/2021 Chemotherapy   Patient is on Treatment Plan : PANCREAS FOLFOX q14d     02/28/2021 Imaging   CT CAP IMPRESSION: 1. Progressive metastatic disease, with substantial enlargement of the multilobular dominant hepatic metastatic lesion, as well as scattered pulmonary nodules which are increasing in size. 2. Other imaging  findings of potential clinical significance: Aortic Atherosclerosis (ICD10-I70.0). Coronary atherosclerosis. Peripheral atherosclerosis. Fatty prominence of the interatrial septum. Possible esophagitis. Emphysema (ICD10-J43.9). Left adrenal adenoma. Atrophic pancreatic tail. Sigmoid colon diverticulosis. Mild multilevel lumbar impingement. No discrete splenic mass today.   03/09/2021 -  Chemotherapy   Patient is on Treatment Plan : PANCREAS Liposomal Irinotecan / Leucovorin / 5-FU  q14d       ALLERGIES:  has No Known Allergies.  MEDICATIONS:  Current Outpatient Medications  Medication Sig Dispense Refill   alendronate (FOSAMAX) 70 MG tablet TAKE 1 TABLET BY MOUTH  EVERY 7 DAYS WITH A FULL  GLASS OF WATER ON AN EMPTY  STOMACH 12 tablet 3   Accu-Chek Softclix Lancets lancets Check blood sugar once a day as instructed 102 each 3   albuterol (PROVENTIL) (2.5 MG/3ML) 0.083% nebulizer solution Take 3 mLs (2.5 mg total) by nebulization every 6 (six) hours as needed for wheezing. 1080 mL 3   albuterol (VENTOLIN HFA) 108 (90 Base) MCG/ACT inhaler INHALE 1 TO 2 PUFFS INTO THE LUNGS EVERY 6 HOURS AS NEEDED FOR WHEEZING OR SHORTNESS OF BREATH 54 g 1   atorvastatin (LIPITOR) 40 MG tablet TAKE 1 TABLET BY MOUTH EVERY DAY 90 tablet 3   benazepril (LOTENSIN) 20 MG tablet Take 1 tablet (20 mg total) by mouth daily. 90 tablet 3   Blood Glucose Monitoring Suppl (ONETOUCH VERIO REFLECT) w/Device KIT Check blood sugar once a day as instructed 1 kit 0   celecoxib (CELEBREX) 100 MG capsule TAKE 1 CAPSULE BY MOUTH  TWICE DAILY 180 capsule 3   cholecalciferol (VITAMIN D) 1000 units tablet Take 2,000 Units by mouth daily.     diclofenac Sodium (VOLTAREN) 1 % GEL Apply 2 g topically 4 (four) times daily. 100 g 2   diphenoxylate-atropine (LOMOTIL) 2.5-0.025 MG tablet Take 1 tablet by mouth 4 (four) times daily as needed for diarrhea or loose stools. 30 tablet 0   famotidine (PEPCID) 40 MG tablet TAKE 1/2 TABLET(20 MG) BY  MOUTH TWICE DAILY 30 tablet 3   fluticasone (FLONASE) 50 MCG/ACT nasal spray Place 1 spray into both nostrils daily. 16 g 3   Fluticasone-Salmeterol (ADVAIR DISKUS) 100-50 MCG/DOSE AEPB Inhale 1 puff into the lungs 2 (two) times daily. 60 each 11   furosemide (LASIX) 20 MG tablet TAKE 1 TABLET BY MOUTH EVERY DAY, ON DAY OF CHEMO AND WHILE USING 5FU INFUSING THEN TAKE 1/2 TABLET BY MOUTH DAILY 30 tablet 1   gabapentin (NEURONTIN) 100 MG capsule TAKE 1 CAPSULE(100 MG) BY MOUTH THREE TIMES DAILY 90 capsule 1   gabapentin (NEURONTIN) 300 MG capsule TAKE 1 CAPSULE(300 MG) BY MOUTH TWICE DAILY 60 capsule 3   gabapentin (NEURONTIN) 800 MG tablet Take 800 mg by mouth daily.     glucose blood (ONETOUCH VERIO) test strip Check blood sugar once a day as instructed 100 each 3   ipratropium (ATROVENT) 0.02 % nebulizer solution Take 0.5 mg by nebulization every 6 (six) hours as needed for wheezing or shortness of breath.     latanoprost (XALATAN) 0.005 % ophthalmic solution PLACE 1 DROP INTO BOTH EYES AT BEDTIME  0  loperamide (LOPERAMIDE A-D) 2 MG tablet Take 1 tablet (2 mg total) by mouth 2 (two) times daily as needed for diarrhea or loose stools. 6 tablet 0   loratadine (CLARITIN) 10 MG tablet Take 1 tablet (10 mg total) by mouth daily. 30 tablet 11   magic mouthwash w/lidocaine SOLN Take 5 mLs by mouth 4 (four) times daily as needed for mouth pain. 480 mL 2   meclizine (ANTIVERT) 12.5 MG tablet TAKE 1 TABLET(12.5 MG) BY MOUTH TWICE DAILY AS NEEDED FOR DIZZINESS 15 tablet 3   Oxycodone HCl 10 MG TABS Take 1 tablet (10 mg total) by mouth every 4 (four) hours as needed. 60 tablet 0   potassium chloride SA (KLOR-CON M) 20 MEQ tablet TAKE 1 TABLET BY MOUTH EVERY DAY 30 tablet 0   prochlorperazine (COMPAZINE) 10 MG tablet TAKE 1 TABLET(10 MG) BY MOUTH EVERY 6 HOURS AS NEEDED FOR NAUSEA OR VOMITING 30 tablet 3   risperiDONE (RISPERDAL) 0.25 MG tablet Take 0.25 mg by mouth daily as needed.     rivaroxaban (XARELTO)  20 MG TABS tablet Take 1 tablet (20 mg total) by mouth daily with supper. 30 tablet 3   sertraline (ZOLOFT) 50 MG tablet Take 50 mg by mouth daily.     traMADol (ULTRAM) 50 MG tablet Take 1 tablet (50 mg total) by mouth every 8 (eight) hours as needed. 30 tablet 0   traZODone (DESYREL) 50 MG tablet Take 25-50 mg by mouth at bedtime as needed for sleep.      triamcinolone cream (KENALOG) 0.1 % apply to affected area twice a day 454 g 1   No current facility-administered medications for this visit.    VITAL SIGNS: LMP 04/29/1966 (LMP Unknown)  There were no vitals filed for this visit.  Estimated body mass index is 22.97 kg/m as calculated from the following:   Height as of an earlier encounter on 04/06/21: 5' (1.524 m).   Weight as of an earlier encounter on 04/06/21: 117 lb 9.6 oz (53.3 kg).  LABS: CBC:    Component Value Date/Time   WBC 12.9 (H) 04/06/2021 0859   WBC 6.8 01/28/2020 1150   HGB 10.9 (L) 04/06/2021 0859   HGB 11.5 04/08/2020 1154   HCT 34.8 (L) 04/06/2021 0859   HCT 35.6 04/08/2020 1154   PLT 264 04/06/2021 0859   PLT 278 04/08/2020 1154   MCV 77.2 (L) 04/06/2021 0859   MCV 86 04/08/2020 1154   NEUTROABS 11.4 (H) 04/06/2021 0859   NEUTROABS 1.8 04/08/2020 1154   LYMPHSABS 0.2 (L) 04/06/2021 0859   LYMPHSABS 0.4 (L) 04/08/2020 1154   MONOABS 1.0 04/06/2021 0859   EOSABS 0.2 04/06/2021 0859   EOSABS 0.1 04/08/2020 1154   BASOSABS 0.0 04/06/2021 0859   BASOSABS 0.1 04/08/2020 1154   Comprehensive Metabolic Panel:    Component Value Date/Time   NA 136 04/06/2021 0859   NA 139 12/26/2019 1124   K 3.8 04/06/2021 0859   CL 96 (L) 04/06/2021 0859   CO2 33 (H) 04/06/2021 0859   BUN 14 04/06/2021 0859   BUN 13 12/26/2019 1124   CREATININE 0.53 04/06/2021 0859   CREATININE 0.86 07/22/2014 1005   GLUCOSE 227 (H) 04/06/2021 0859   CALCIUM 9.8 04/06/2021 0859   AST 37 04/06/2021 0859   ALT 41 04/06/2021 0859   ALKPHOS 203 (H) 04/06/2021 0859   BILITOT 1.8 (H)  04/06/2021 0859   PROT 6.2 (L) 04/06/2021 0859   PROT 6.7 12/26/2019 1124   ALBUMIN  2.9 (L) 04/06/2021 0859   ALBUMIN 4.0 12/26/2019 1124    RADIOGRAPHIC STUDIES: ECHOCARDIOGRAM COMPLETE  Result Date: 03/25/2021    ECHOCARDIOGRAM REPORT   Patient Name:   LARRISHA PARVIN Date of Exam: 03/25/2021 Medical Rec #:  [id number]       Height:       60.0 in Accession #:    [id number]      Weight:       122.2 lb Date of Birth:  05-08-1950        BSA:          1.514 m Patient Age:    71 years        BP:           151/81 mmHg Patient Gender: F               HR:           93 bpm. Exam Location:  Outpatient Procedure: 2D Echo, Cardiac Doppler, Color Doppler and Strain Analysis Indications:    Edema, Chemo  History:        Patient has prior history of Echocardiogram examinations, most                 recent 09/18/2012. CHF, COPD, Signs/Symptoms:Murmur; Risk                 Factors:Hypertension, Dyslipidemia and Diabetes.  Sonographer:    Neomia Dear RDCS Referring Phys: [id number] ERIK C HOFFMAN IMPRESSIONS  1. Left ventricular ejection fraction, by estimation, is 65 to 70%. The left ventricle has normal function. The left ventricle has no regional wall motion abnormalities. There is mild left ventricular hypertrophy. Left ventricular diastolic parameters are consistent with Grade I diastolic dysfunction (impaired relaxation).  2. Right ventricular systolic function is normal. The right ventricular size is normal. Tricuspid regurgitation signal is inadequate for assessing PA pressure.  3. The mitral valve is normal in structure. No evidence of mitral valve regurgitation.  4. The aortic valve is tricuspid. Aortic valve regurgitation is not visualized. No aortic stenosis is present. FINDINGS  Left Ventricle: Left ventricular ejection fraction, by estimation, is 65 to 70%. The left ventricle has normal function. The left ventricle has no regional wall motion abnormalities. The left ventricular internal cavity size was small. There is  mild left ventricular hypertrophy. Left ventricular diastolic parameters are consistent with Grade I diastolic dysfunction (impaired relaxation). Right Ventricle: The right ventricular size is normal. No increase in right ventricular wall thickness. Right ventricular systolic function is normal. Tricuspid regurgitation signal is inadequate for assessing PA pressure. Left Atrium: Left atrial size was normal in size. Right Atrium: Right atrial size was normal in size. Pericardium: There is no evidence of pericardial effusion. Mitral Valve: The mitral valve is normal in structure. No evidence of mitral valve regurgitation. Tricuspid Valve: The tricuspid valve is normal in structure. Tricuspid valve regurgitation is trivial. Aortic Valve: The aortic valve is tricuspid. Aortic valve regurgitation is not visualized. No aortic stenosis is present. Aortic valve mean gradient measures 4.0 mmHg. Aortic valve peak gradient measures 7.1 mmHg. Aortic valve area, by VTI measures 2.03 cm. Pulmonic Valve: The pulmonic valve was not well visualized. Pulmonic valve regurgitation is not visualized. Aorta: The aortic root and ascending aorta are structurally normal, with no evidence of dilitation. IAS/Shunts: The interatrial septum was not well visualized.  LEFT VENTRICLE PLAX 2D LVIDd:         3.40 cm     Diastology LVIDs:  2.00 cm     LV e' medial:    6.64 cm/s LV PW:         1.20 cm     LV E/e' medial:  9.2 LV IVS:        1.30 cm     LV e' lateral:   8.27 cm/s LVOT diam:     1.80 cm     LV E/e' lateral: 7.4 LV SV:         46 LV SV Index:   31 LVOT Area:     2.54 cm  LV Volumes (MOD) LV vol d, MOD A2C: 34.1 ml RIGHT VENTRICLE RV Basal diam:  3.00 cm RV Mid diam:    2.10 cm RV S prime:     20.20 cm/s TAPSE (M-mode): 2.4 cm LEFT ATRIUM             Index        RIGHT ATRIUM          Index LA diam:        2.50 cm 1.65 cm/m   RA Area:     7.34 cm LA Vol (A2C):   30.5 ml 20.14 ml/m  RA Volume:   12.90 ml 8.52 ml/m LA Vol  (A4C):   25.6 ml 16.91 ml/m LA Biplane Vol: 28.8 ml 19.02 ml/m  AORTIC VALVE                    PULMONIC VALVE AV Area (Vmax):    2.07 cm     PV Vmax:       1.23 m/s AV Area (Vmean):   2.02 cm     PV Vmean:      83.400 cm/s AV Area (VTI):     2.03 cm     PV VTI:        0.206 m AV Vmax:           133.00 cm/s  PV Peak grad:  6.0 mmHg AV Vmean:          88.450 cm/s  PV Mean grad:  3.0 mmHg AV VTI:            0.229 m AV Peak Grad:      7.1 mmHg AV Mean Grad:      4.0 mmHg LVOT Vmax:         108.00 cm/s LVOT Vmean:        70.350 cm/s LVOT VTI:          0.182 m LVOT/AV VTI ratio: 0.80  AORTA Ao Root diam: 2.60 cm Ao Asc diam:  2.50 cm MITRAL VALVE MV Area (PHT): 2.80 cm     SHUNTS MV Decel Time: 271 msec     Systemic VTI:  0.18 m MV E velocity: 61.30 cm/s   Systemic Diam: 1.80 cm MV A velocity: 118.00 cm/s MV E/A ratio:  0.52 Epifanio Lesches MD Electronically signed by Epifanio Lesches MD Signature Date/Time: 03/25/2021/10:06:26 PM    Final     PERFORMANCE STATUS (ECOG) : 1 - Symptomatic but completely ambulatory  Review of Systems  Constitutional:  Positive for appetite change and fatigue.  Gastrointestinal:  Positive for constipation.       Right sided (flank) pain  Neurological:  Positive for weakness.       Falls   Unless otherwise noted, a complete review of systems is negative.  Physical Exam General: NAD,  HEENT: alopecia with scalp discoloration  Cardiovascular: regular rate and rhythm Pulmonary: clear  ant fields Abdomen: soft,  RUQ tender, + bowel sounds Extremities: no edema, no joint deformities Neurological: AAO x4, mood appropriate  IMPRESSION:  This is my initial visit with Ms. Dettman. Her niece Morrie Sheldon is also with her today. No acute distress noted. Patient is ambulatory with stand-by assistance. Gait slightly unsteady.   I introduced myself, Research officer, trade union, and Palliative's role in collaboration with the oncology team. Concept of Palliative Care was introduced as  specialized medical care for people and their families living with serious illness.  It focuses on providing relief from the symptoms and stress of a serious illness.  The goal is to improve quality of life for both the patient and the family. Values and goals of care important to patient and family were attempted to be elicited.   Ms. Morimoto lives alone in her home. Morrie Sheldon lives within 5 mins of her and comes over to assist her daily. She has a son who resides in [place] but is also involved.   In the home she is independent of most ADLs. Does have some gait instability and recent fall. She is requesting a walker and bedside commode for assistance and safety.   Neoplasm related pain Ms. Rivenburg complains of ongoing right flank pain that will radiate around to her back at times. Stats pain interrupts her sleeping and daily activities.   We reviewed her current regimen. She is currently taking Celebrex, gabapentin, and oxycodone. She shares current regimen was initially allowing her pain to be manageable however over the past few days-weeks pain seems to be increasing and more ongoing despite regimen. She is taking her Oxycodone around the clock every 4-6 hours which causes her to be drowsy and spending most of her time in the bed.   Her wishes would be for better pain control with hopes she would be able to function somewhat throughout the day. She wishes to remain as active for as long as she can however also does not want to be in ongoing pain. Support provided.   Education provided on use of MS Contin ER with hopes of increased relief and requiring less need for Oxycodone use. Education provided on use, expectations, and potential side effects. Patient and niece verbalized understanding.   They understand the team will continue to closely monitor and make adjustments as needed.   Constipation She reports chronic issues with constipation. She generally has a bowel movement every 3-4 days.  Education provided on bowel regimen to prevent worsening constipation. Recommended Miralax daily. If no bowel movement in more than 2 days she will then increase Miralax to twice daily. Morrie Sheldon verbalized understanding.   Decreased appetite  Appetite continues to be a challenge for Ms. Fearn. Family reports she may have a desire to eat certain foods only to take a few bites and disregard the rest. She shares her appetite has decreased in addition to "foods just don't taste the same!"   Education provided on trying small frequent meals throughout the day versus 3 large meals, snacking in between, and heating when hunger sensations arise or cravings. Also recommended increasing protein intake if possible. Ensure or boost drinks 1-2 daily as tolerated.   Her current weight is 117.9lbs down from 116lbs on 3/8, 122lbs on 2/22, and 126lbs on 2/15. We will continue to closely monitor and address pending on goals of care/wishes.   Goals of care  We discussed Her current illness and what it means in the larger context of Her on-going co-morbidities. Natural disease trajectory and  expectations were discussed.  Mrs. Glennie Isle and Morrie Sheldon are realistic in their understanding. They are remaining hopeful for the best but also preparing for no improvement or future health decline. She plans to appropriately take things day by day.   I empathetically approached discussions regarding advanced directives and code status. Patient and family are in ongoing discussions regarding her wishes. She is connected with outpatient Palliative and recently had a visit with Dr. Azucena Kuba. She was provided with advanced directive documents which they plan to further discuss and complete in the near future.   I discussed the importance of continued conversation with family and their medical providers regarding overall plan of care and treatment options, ensuring decisions are within the context of the patients values and GOCs.  PLAN: MS  Contin 15mg  every 12 hours. Will closely monitor and adjust as needed.  Continue Oxycodone as needed for breakthrough pain.  Gabapentin as prescribed.  Celebrex as prescribed Miralax daily for bowel regimen Ongoing goa of care discussion. She is also connected with [facility].  Established therapeutic relationship.  I will plan to see patient back in 2-4 weeks in collaboration to other oncology appointments. Patient and family knows to call sooner if needed.    Patient expressed understanding and was in agreement with this plan. She also understands that She can call the clinic at any time with any questions, concerns, or complaints.   Time Total: 35 min.   Visit consisted of counseling and education dealing with the complex and emotionally intense issues of symptom management and palliative care in the setting of serious and potentially life-threatening illness.Greater than 50%  of this time was spent counseling and coordinating care related to the above assessment and plan.  Signed by: Willette Alma, AGPCNP-BC Palliative Medicine Team/[facility]

## 2021-04-06 NOTE — Telephone Encounter (Signed)
Oral Oncology Patient Advocate Encounter ?  ?Received notification from [facility] that prior authorization for Lonie Peak is required. ?  ?PA submitted on CoverMyMeds ?Key [id number] ?Status is pending ?  ?Oral Oncology Clinic will continue to follow. ? ?Wynn Maudlin CPHT ?Specialty Pharmacy Patient Advocate ?[place] ?Phone [phone] ?Fax [phone] ?04/06/2021 2:45 PM ? ? ?

## 2021-04-06 NOTE — Progress Notes (Signed)
Patient assessment, labs discussed between patient, Cira Rue, [place] and Dr. Burr Medico- treatment was held for today. Patient was de-accessed and sent home with her niece. ?

## 2021-04-06 NOTE — Telephone Encounter (Signed)
Called Mariah Henderson to discuss DME needs.  ? ?Received a message from Dr. Mariea Clonts with [facility] palliative care that patient requested rollator walker and raised toilet seat.  ? ?During our call today, Mariah Henderson reports increased weakness, fatigue, difficulty rising from the toilet, difficulty with ambulation but no falls, and pain when sitting on the toilet seat in her back.  Per review of chart, she has been having decreased appetite, weight loss.  ? ?She will now be transitioning to olaparib as she is not responding well to the current chemotherapy.   ? ?Plan ?Order for DME rollator walker and raised toilet seat placed ?Forwarded to [facility], [facility] ? ?Gilles Chiquito, MD ? ?

## 2021-04-06 NOTE — Telephone Encounter (Signed)
Scheduled follow-up appointment per 3/22 los. Patient's niece is aware. ?

## 2021-04-06 NOTE — Telephone Encounter (Signed)
Oral Oncology Pharmacist Encounter ? ?Received new prescription for Lynparza (olaparib) for the treatment of metastatic pancreatic cancer, BRCA2 mutated, planned duration until disease progression or unacceptable drug toxicity. ? ?CBC w/ Diff and CMP from 04/06/21 assessed, noted WBC up (12.9 K/uL), renal function stable, AST/ALT WNL, alk phos elevated at 203 U/L and T. Bili up to 1.8 mg/dL. No baseline heaptic dose adjustments recommended per package insert. Prescription dose and frequency assessed for appropriateness.  ? ?Current medication list in Epic reviewed, no relevant/significant DDIs with [place] identified. ? ?Evaluated chart and no patient barriers to medication adherence noted.  ? ?Patient agreement for treatment documented in MD note on 04/06/21. ? ?Prescription has been e-scribed to the [facility] for benefits analysis and approval. ? ?Oral Oncology Clinic will continue to follow for insurance authorization, copayment issues, initial counseling and start date. ? ?Leron Croak, PharmD, BCPS ?Hematology/Oncology Clinical Pharmacist ?[facility] Oral Chemotherapy Navigation Clinics ?[phone] ?04/06/2021 2:55 PM ? ?

## 2021-04-08 ENCOUNTER — Other Ambulatory Visit: Payer: Self-pay | Admitting: Internal Medicine

## 2021-04-08 ENCOUNTER — Inpatient Hospital Stay: Payer: Medicare Other

## 2021-04-08 DIAGNOSIS — R600 Localized edema: Secondary | ICD-10-CM

## 2021-04-11 NOTE — Telephone Encounter (Signed)
Oral Oncology Patient Advocate Encounter ? ?Received notification from [facility] that the request for prior authorization for Lonie Peak has been denied due to not meeting requirements.   ?  ?Seeking assistance from [employer] application.  ? ?This encounter will continue to be updated until final determination.   ? ?Wynn Maudlin CPHT ?Specialty Pharmacy Patient Advocate ?[place] ?Phone [phone] ?Fax [phone] ?04/11/2021 11:38 AM ? ?

## 2021-04-11 NOTE — Telephone Encounter (Addendum)
Oral Chemotherapy Pharmacist Encounter ? ?I spoke with patient today for overview of: Mariah Henderson for the treatment of metastatic pancreatic cancer, BRCA2 mutated, planned duration until disease progression or unacceptable toxicity.  ? ?Counseled patient on administration, dosing, side effects, monitoring, drug-food interactions, safe handling, storage, and disposal. ? ?Patient started on samples to expedite treatment initiation while awaiting manufacturer assistance approval. Samples available are for Lynparza 100 mg tablets (LOT: [id number], Exp: 04/16/2022) - patient will start by take Lynparza 161m tablets, 3 tablets (3069m by mouth 2 times daily without regard to food. ? ?Once she is approved for manufacturer assistance, at that time she will switch to the Lynparza 150 mg tablets, and take 2 tablets (300 mg) by mouth 2 times daily. ? ?Patient counseled that administering with food may increase tolerability. ? ?Patient knows to avoid grapefruit or grapefruit juice while on therapy with Lynparza. ? ?LyLonie Peaktart date: 04/12/21  ? ?Adverse effects include but are not limited to: nausea, vomiting, diarrhea, mouth sores, fatigue, constipation, decreased blood counts. Patient informed that pneumonitis (including some fatalities) has occurred rarely. ?Myelodysplastic syndrome/acute myeloid leukemia (MDS/AML) have been reported (rarely) in clinical trials. ? ?Patient has anti-emetic on hand and knows to take it if nausea develops.   ?Patient will obtain anti diarrheal and alert the office of 4 or more loose stools above baseline. ? ?Reviewed with patient importance of keeping a medication schedule and plan for any missed doses. No barriers to medication adherence identified. ? ?Medication reconciliation performed and medication/allergy list updated. ? ?All questions answered. ? ?Mariah Henderson voiced understanding and appreciation.  ? ?Medication education handout will be given to patient on 04/12/21. Patient knows to call the  office with questions or concerns. Oral Chemotherapy Clinic phone number provided to patient.  ? ?ReLeron CroakPharmD, BCPS ?Hematology/Oncology Clinical Pharmacist ?[facility] ?[phone]/27/2023 1:57 PM ? ?

## 2021-04-12 ENCOUNTER — Inpatient Hospital Stay: Payer: Medicare Other | Admitting: Nutrition

## 2021-04-12 ENCOUNTER — Telehealth: Payer: Self-pay | Admitting: Nutrition

## 2021-04-12 NOTE — Telephone Encounter (Signed)
Nutrition follow-up completed with patient over telephone.  Noted patient now on Olaparib.  She has been educated by pharmacy to avoid grapefruit or grapefruit juice.  Patient reports she is better.  Her appetite has improved and she is eating more.  Her only problem right now is increased pain and needing a refill on her pain medication.  She would appreciate additional Glucerna samples but is not sure when she will be back at the cancer center. ? ?Weight has improved to 117 pounds March 22. ? ?Nutrition diagnosis: Unintended weight loss improved. ? ?Intervention: ?Encourage patient to continue strategies for adequate calorie and protein intake. ?Continue oral nutrition supplements as needed. ?We will provide additional samples once patient notifies [place] of when she would like to pick these up. ? ?Monitoring, evaluation, goals: ?We will continue to monitor weight trends and oral intake. ? ?Next visit: To be scheduled as needed. ? ?**Disclaimer: This note was dictated with voice recognition software. Similar sounding words can inadvertently be transcribed and this note may contain transcription errors which may not have been corrected upon publication of note.** ? ?

## 2021-04-13 ENCOUNTER — Emergency Department (HOSPITAL_COMMUNITY): Payer: Medicare Other

## 2021-04-13 ENCOUNTER — Other Ambulatory Visit: Payer: Self-pay

## 2021-04-13 ENCOUNTER — Inpatient Hospital Stay (HOSPITAL_COMMUNITY)
Admission: EM | Admit: 2021-04-13 | Discharge: 2021-04-21 | DRG: 091 | Disposition: A | Discharge status: Hospice | Payer: Medicare Other | Attending: Internal Medicine | Admitting: Internal Medicine

## 2021-04-13 ENCOUNTER — Observation Stay (HOSPITAL_COMMUNITY): Payer: Medicare Other

## 2021-04-13 DIAGNOSIS — C7931 Secondary malignant neoplasm of brain: Secondary | ICD-10-CM | POA: Diagnosis not present

## 2021-04-13 DIAGNOSIS — C7800 Secondary malignant neoplasm of unspecified lung: Secondary | ICD-10-CM | POA: Diagnosis not present

## 2021-04-13 DIAGNOSIS — Z7951 Long term (current) use of inhaled steroids: Secondary | ICD-10-CM | POA: Diagnosis not present

## 2021-04-13 DIAGNOSIS — Z79899 Other long term (current) drug therapy: Secondary | ICD-10-CM

## 2021-04-13 DIAGNOSIS — Z981 Arthrodesis status: Secondary | ICD-10-CM

## 2021-04-13 DIAGNOSIS — Z7401 Bed confinement status: Secondary | ICD-10-CM | POA: Diagnosis not present

## 2021-04-13 DIAGNOSIS — Z7983 Long term (current) use of bisphosphonates: Secondary | ICD-10-CM

## 2021-04-13 DIAGNOSIS — C259 Malignant neoplasm of pancreas, unspecified: Secondary | ICD-10-CM | POA: Diagnosis not present

## 2021-04-13 DIAGNOSIS — M545 Low back pain, unspecified: Secondary | ICD-10-CM | POA: Diagnosis present

## 2021-04-13 DIAGNOSIS — G893 Neoplasm related pain (acute) (chronic): Secondary | ICD-10-CM | POA: Diagnosis present

## 2021-04-13 DIAGNOSIS — C787 Secondary malignant neoplasm of liver and intrahepatic bile duct: Secondary | ICD-10-CM | POA: Diagnosis not present

## 2021-04-13 DIAGNOSIS — L8990 Pressure ulcer of unspecified site, unspecified stage: Secondary | ICD-10-CM | POA: Insufficient documentation

## 2021-04-13 DIAGNOSIS — R630 Anorexia: Secondary | ICD-10-CM | POA: Diagnosis present

## 2021-04-13 DIAGNOSIS — Z800 Family history of malignant neoplasm of digestive organs: Secondary | ICD-10-CM

## 2021-04-13 DIAGNOSIS — R7401 Elevation of levels of liver transaminase levels: Secondary | ICD-10-CM | POA: Diagnosis present

## 2021-04-13 DIAGNOSIS — R1310 Dysphagia, unspecified: Secondary | ICD-10-CM | POA: Diagnosis present

## 2021-04-13 DIAGNOSIS — I771 Stricture of artery: Secondary | ICD-10-CM | POA: Diagnosis not present

## 2021-04-13 DIAGNOSIS — I6622 Occlusion and stenosis of left posterior cerebral artery: Secondary | ICD-10-CM | POA: Diagnosis not present

## 2021-04-13 DIAGNOSIS — Z79891 Long term (current) use of opiate analgesic: Secondary | ICD-10-CM

## 2021-04-13 DIAGNOSIS — C7889 Secondary malignant neoplasm of other digestive organs: Secondary | ICD-10-CM | POA: Diagnosis not present

## 2021-04-13 DIAGNOSIS — Z743 Need for continuous supervision: Secondary | ICD-10-CM | POA: Diagnosis not present

## 2021-04-13 DIAGNOSIS — Z8249 Family history of ischemic heart disease and other diseases of the circulatory system: Secondary | ICD-10-CM

## 2021-04-13 DIAGNOSIS — L89220 Pressure ulcer of left hip, unstageable: Secondary | ICD-10-CM | POA: Diagnosis present

## 2021-04-13 DIAGNOSIS — R000 Tachycardia, unspecified: Secondary | ICD-10-CM | POA: Diagnosis not present

## 2021-04-13 DIAGNOSIS — I6523 Occlusion and stenosis of bilateral carotid arteries: Secondary | ICD-10-CM | POA: Diagnosis not present

## 2021-04-13 DIAGNOSIS — R9089 Other abnormal findings on diagnostic imaging of central nervous system: Secondary | ICD-10-CM

## 2021-04-13 DIAGNOSIS — Z6822 Body mass index (BMI) 22.0-22.9, adult: Secondary | ICD-10-CM

## 2021-04-13 DIAGNOSIS — I2510 Atherosclerotic heart disease of native coronary artery without angina pectoris: Secondary | ICD-10-CM | POA: Diagnosis present

## 2021-04-13 DIAGNOSIS — J90 Pleural effusion, not elsewhere classified: Secondary | ICD-10-CM | POA: Diagnosis not present

## 2021-04-13 DIAGNOSIS — E1142 Type 2 diabetes mellitus with diabetic polyneuropathy: Secondary | ICD-10-CM | POA: Diagnosis not present

## 2021-04-13 DIAGNOSIS — G319 Degenerative disease of nervous system, unspecified: Secondary | ICD-10-CM | POA: Diagnosis not present

## 2021-04-13 DIAGNOSIS — Z803 Family history of malignant neoplasm of breast: Secondary | ICD-10-CM

## 2021-04-13 DIAGNOSIS — C799 Secondary malignant neoplasm of unspecified site: Secondary | ICD-10-CM | POA: Diagnosis not present

## 2021-04-13 DIAGNOSIS — E43 Unspecified severe protein-calorie malnutrition: Secondary | ICD-10-CM | POA: Diagnosis not present

## 2021-04-13 DIAGNOSIS — M21379 Foot drop, unspecified foot: Secondary | ICD-10-CM | POA: Diagnosis not present

## 2021-04-13 DIAGNOSIS — D72829 Elevated white blood cell count, unspecified: Secondary | ICD-10-CM | POA: Diagnosis not present

## 2021-04-13 DIAGNOSIS — Z66 Do not resuscitate: Secondary | ICD-10-CM | POA: Diagnosis present

## 2021-04-13 DIAGNOSIS — I10 Essential (primary) hypertension: Secondary | ICD-10-CM | POA: Diagnosis present

## 2021-04-13 DIAGNOSIS — I499 Cardiac arrhythmia, unspecified: Secondary | ICD-10-CM | POA: Diagnosis not present

## 2021-04-13 DIAGNOSIS — G928 Other toxic encephalopathy: Secondary | ICD-10-CM | POA: Diagnosis not present

## 2021-04-13 DIAGNOSIS — G9340 Encephalopathy, unspecified: Secondary | ICD-10-CM | POA: Diagnosis not present

## 2021-04-13 DIAGNOSIS — K5900 Constipation, unspecified: Secondary | ICD-10-CM | POA: Diagnosis not present

## 2021-04-13 DIAGNOSIS — J449 Chronic obstructive pulmonary disease, unspecified: Secondary | ICD-10-CM | POA: Diagnosis present

## 2021-04-13 DIAGNOSIS — E860 Dehydration: Secondary | ICD-10-CM | POA: Diagnosis not present

## 2021-04-13 DIAGNOSIS — G9341 Metabolic encephalopathy: Secondary | ICD-10-CM | POA: Diagnosis present

## 2021-04-13 DIAGNOSIS — R410 Disorientation, unspecified: Secondary | ICD-10-CM | POA: Diagnosis not present

## 2021-04-13 DIAGNOSIS — G9389 Other specified disorders of brain: Secondary | ICD-10-CM | POA: Diagnosis not present

## 2021-04-13 DIAGNOSIS — E785 Hyperlipidemia, unspecified: Secondary | ICD-10-CM | POA: Diagnosis present

## 2021-04-13 DIAGNOSIS — R4020 Unspecified coma: Secondary | ICD-10-CM | POA: Diagnosis not present

## 2021-04-13 DIAGNOSIS — Z87891 Personal history of nicotine dependence: Secondary | ICD-10-CM

## 2021-04-13 DIAGNOSIS — K219 Gastro-esophageal reflux disease without esophagitis: Secondary | ICD-10-CM | POA: Diagnosis present

## 2021-04-13 DIAGNOSIS — R739 Hyperglycemia, unspecified: Secondary | ICD-10-CM | POA: Diagnosis not present

## 2021-04-13 DIAGNOSIS — Z515 Encounter for palliative care: Secondary | ICD-10-CM

## 2021-04-13 DIAGNOSIS — T451X5A Adverse effect of antineoplastic and immunosuppressive drugs, initial encounter: Secondary | ICD-10-CM | POA: Diagnosis present

## 2021-04-13 DIAGNOSIS — R29818 Other symptoms and signs involving the nervous system: Secondary | ICD-10-CM | POA: Diagnosis not present

## 2021-04-13 DIAGNOSIS — Z7189 Other specified counseling: Secondary | ICD-10-CM | POA: Diagnosis not present

## 2021-04-13 DIAGNOSIS — Z820 Family history of epilepsy and other diseases of the nervous system: Secondary | ICD-10-CM

## 2021-04-13 DIAGNOSIS — R0902 Hypoxemia: Secondary | ICD-10-CM | POA: Diagnosis not present

## 2021-04-13 DIAGNOSIS — Z823 Family history of stroke: Secondary | ICD-10-CM

## 2021-04-13 LAB — CBC
HCT: 39.4 % (ref 36.0–46.0)
Hemoglobin: 11.6 g/dL — ABNORMAL LOW (ref 12.0–15.0)
MCH: 23.8 pg — ABNORMAL LOW (ref 26.0–34.0)
MCHC: 29.4 g/dL — ABNORMAL LOW (ref 30.0–36.0)
MCV: 80.7 fL (ref 80.0–100.0)
Platelets: 203 10*3/uL (ref 150–400)
RBC: 4.88 MIL/uL (ref 3.87–5.11)
RDW: 27.9 % — ABNORMAL HIGH (ref 11.5–15.5)
WBC: 15.6 10*3/uL — ABNORMAL HIGH (ref 4.0–10.5)
nRBC: 0.2 % (ref 0.0–0.2)

## 2021-04-13 LAB — TSH: TSH: 0.619 u[IU]/mL (ref 0.350–4.500)

## 2021-04-13 LAB — URINALYSIS, MICROSCOPIC (REFLEX)

## 2021-04-13 LAB — URINALYSIS, ROUTINE W REFLEX MICROSCOPIC
Glucose, UA: 100 mg/dL — AB
Hgb urine dipstick: NEGATIVE
Ketones, ur: NEGATIVE mg/dL
Leukocytes,Ua: NEGATIVE
Nitrite: NEGATIVE
Protein, ur: 30 mg/dL — AB
Specific Gravity, Urine: >1.03 — ABNORMAL HIGH (ref 1.005–1.030)
pH: 5.5 (ref 5.0–8.0)

## 2021-04-13 LAB — DIFFERENTIAL
Abs Immature Granulocytes: 0 10*3/uL (ref 0.00–0.07)
Basophils Absolute: 0 10*3/uL (ref 0.0–0.1)
Basophils Relative: 0 %
Eosinophils Absolute: 0.2 10*3/uL (ref 0.0–0.5)
Eosinophils Relative: 1 %
Lymphocytes Relative: 0 %
Lymphs Abs: 0 10*3/uL — ABNORMAL LOW (ref 0.7–4.0)
Monocytes Absolute: 0.5 10*3/uL (ref 0.1–1.0)
Monocytes Relative: 3 %
Neutro Abs: 15 10*3/uL — ABNORMAL HIGH (ref 1.7–7.7)
Neutrophils Relative %: 96 %
nRBC: 0 /100 WBC

## 2021-04-13 LAB — COMPREHENSIVE METABOLIC PANEL
ALT: 44 U/L (ref 0–44)
AST: 64 U/L — ABNORMAL HIGH (ref 15–41)
Albumin: 2.2 g/dL — ABNORMAL LOW (ref 3.5–5.0)
Alkaline Phosphatase: 179 U/L — ABNORMAL HIGH (ref 38–126)
Anion gap: 8 (ref 5–15)
BUN: 14 mg/dL (ref 8–23)
CO2: 30 mmol/L (ref 22–32)
Calcium: 9.8 mg/dL (ref 8.9–10.3)
Chloride: 97 mmol/L — ABNORMAL LOW (ref 98–111)
Creatinine, Ser: 0.7 mg/dL (ref 0.44–1.00)
GFR, Estimated: >60 mL/min (ref >60)
Glucose, Bld: 187 mg/dL — ABNORMAL HIGH (ref 70–99)
Potassium: 4.5 mmol/L (ref 3.5–5.1)
Sodium: 135 mmol/L (ref 135–145)
Total Bilirubin: 2.3 mg/dL — ABNORMAL HIGH (ref 0.3–1.2)
Total Protein: 5.8 g/dL — ABNORMAL LOW (ref 6.5–8.1)

## 2021-04-13 LAB — CBG MONITORING, ED: Glucose-Capillary: 183 mg/dL — ABNORMAL HIGH (ref 70–99)

## 2021-04-13 LAB — I-STAT CHEM 8, ED
BUN: 18 mg/dL (ref 8–23)
Calcium, Ion: 1.22 mmol/L (ref 1.15–1.40)
Chloride: 96 mmol/L — ABNORMAL LOW (ref 98–111)
Creatinine, Ser: 0.6 mg/dL (ref 0.44–1.00)
Glucose, Bld: 190 mg/dL — ABNORMAL HIGH (ref 70–99)
HCT: 40 % (ref 36.0–46.0)
Hemoglobin: 13.6 g/dL (ref 12.0–15.0)
Potassium: 4.9 mmol/L (ref 3.5–5.1)
Sodium: 135 mmol/L (ref 135–145)
TCO2: 35 mmol/L — ABNORMAL HIGH (ref 22–32)

## 2021-04-13 LAB — MAGNESIUM: Magnesium: 1.8 mg/dL (ref 1.7–2.4)

## 2021-04-13 LAB — APTT: aPTT: 26 seconds (ref 24–36)

## 2021-04-13 LAB — BRAIN NATRIURETIC PEPTIDE: B Natriuretic Peptide: 49.9 pg/mL (ref 0.0–100.0)

## 2021-04-13 LAB — SALICYLATE LEVEL: Salicylate Lvl: <7 mg/dL — ABNORMAL LOW (ref 7.0–30.0)

## 2021-04-13 LAB — PROTIME-INR
INR: 1.4 — ABNORMAL HIGH (ref 0.8–1.2)
Prothrombin Time: 17.5 seconds — ABNORMAL HIGH (ref 11.4–15.2)

## 2021-04-13 MED ORDER — ACETAMINOPHEN 650 MG RE SUPP: 650.0000 mg | Freq: Four times a day (QID) | RECTAL | Status: DC | PRN

## 2021-04-13 MED ORDER — SENNOSIDES-DOCUSATE SODIUM 8.6-50 MG PO TABS: 1.0000 | ORAL_TABLET | Freq: Every evening | ORAL | Status: DC | PRN

## 2021-04-13 MED ORDER — ENOXAPARIN SODIUM 40 MG/0.4ML IJ SOSY
40.0000 mg | PREFILLED_SYRINGE | INTRAMUSCULAR | Status: DC
  Administered 2021-04-13: 40 mg via SUBCUTANEOUS
  Filled 2021-04-13: qty 0.4

## 2021-04-13 MED ORDER — ACETAMINOPHEN 325 MG PO TABS
650.0000 mg | ORAL_TABLET | Freq: Four times a day (QID) | ORAL | Status: DC | PRN
  Administered 2021-04-17: 650 mg via ORAL
  Filled 2021-04-13 (×2): qty 2

## 2021-04-13 MED ORDER — SODIUM CHLORIDE 0.9% FLUSH
3.0000 mL | Freq: Once | INTRAVENOUS | Status: AC
  Administered 2021-04-13: 3 mL via INTRAVENOUS

## 2021-04-13 MED ORDER — LACTATED RINGERS IV SOLN: INTRAVENOUS | Status: AC

## 2021-04-13 NOTE — Code Documentation (Signed)
Stroke Response Nurse Documentation ?Code Documentation ? ?Mariah Henderson is a 71 y.o. female arriving to [facility]  via [facility] on 04/13/21 with past medical hx of metastatic pancreatic cancer, COPD, DM, HTN, HLD. On No antithrombotic. Code stroke was activated by EMS.  ? ?Patient from home where she was LKW at 0700. Family found patient unresponsive and activated EMS. ? ?Stroke team at the bedside on patient arrival. Labs drawn and patient cleared for CT by Dr. Alvino Chapel. Patient to CT with team. NIHSS 7, see documentation for details and code stroke times. Patient with disoriented, not following commands, right arm weakness, bilateral decreased sensation, Global aphasia , dysarthria , and Sensory  neglect on exam.  ? ?The following imaging was completed:  CT Head. Patient is not a candidate for IV Thrombolytic due to outside of window. Patient is not not a candidate for IR. Marland Kitchen  ? ?Care Plan: q2h NIH and vitals.  ? ?Bedside handoff with ED RN William Hamburger.   ? ?Newman Nickels  ?Stroke Response RN ? ? ?

## 2021-04-13 NOTE — Consult Note (Addendum)
Neurology Attending Attestation ?  ?I examined the patient and discussed plan with Ms. Beulah Gandy NP. I was present throughout the stroke code and made all significant decisions and personally reviewed CNS imaging. I agree with NP note below with the following additions/exceptions:  ?  ?71 year old woman with a history of stage IV pancreatic cancer with mets to the liver, hypertension, hyperlipidemia, diabetes, COPD who was brought in by EMS from home after being found unresponsive.  Stroke code was activated in the field for possible right-sided weakness.  Last known well was 7:00 AM.  TNK was not considered due to presentation outside the window.  On my examination she had 4 out of 5 strength diffusely and had a nonfocal neurologic examination.  She was oriented to self, hospital, age.  She was altered and unable to provide a clear history.  Stroke scale was 7, see below.  Head CT was notable for possible acute infarct in the right pons.  CTA was not performed as part of the stroke code due to exam not consistent with LVO and MR S equal to 4.  I called her son [name], contact information below, who clarified that they have begun to meet with palliative care but have not yet made a decision regarding hospice.  To be clear she is not on comfort care at this time.  He did say however that he would not want her to be put on life support and wished for her to be DNAR in the event of an emergency.  I did put in this order.  Overall her examination seems most consistent with encephalopathy however based on the CT scan we will do stroke work-up below.  Patient should be n.p.o. until nursing dysphagia screen is completed and passed.  We will also add a routine EEG due to her period of unresponsiveness.  Additional stroke work-up per plan outlined below.  Stroke team will continue to follow. ?  ?Su Monks, MD ?[facility] ?[phone] ?  ?If 7pm- 7am, please page neurology on call as listed in  [place]. ? ?Neurology Consultation ? ?Reason for Consult: Code stroke ?Referring Physician: Dr. Alvino Chapel  ? ?CC: unresponsive with right side weakness ? ?History is obtained from:medical record ? ?HPI: Mariah Henderson is a 71 y.o. female with past medical history of  ?Pancreatic cancer with metastasis to liver, HTN, HLD, DM, COPD, anxiety/depression, who presents from home after being found unresponsive and right side weakness. LKW 0700. EMS called by fall after being found unresponsive. On EMS arrival O2 sats 71% and was placed on NRB. With noted Right side weakness. Code stroke called in field ? ?Dr. Quinn Axe spoke with [name] (son [name]) @ [phone] and confirmed that the patient is a DNAR and updated on patient status.  ?Niece [name] [phone] ? ?LKW: 0700 ?tpa given?: no, out of window  ?Premorbid modified Rankin scale (mRS):  ?4-Needs assistance to walk and tend to bodily needs ? ?ROS:  Unable to obtain due to altered mental status.  ? ?Past Medical History:  ?Diagnosis Date  ? Acute medial meniscus tear   ? right knee  ? Anxiety   ? Asthma   ? Cervical spondylosis   ? Chronic serous otitis media   ? Constipation   ? COPD (chronic obstructive pulmonary disease) ([place])   ? Depression   ? followed by [name]; no longer of depakote, seroquel  ? Diabetes mellitus without complication ([place])   ? Dysuria 12/27/2009  ? Qualifier: Diagnosis of  ByIna Homes MD, [place]    ? Foot drop   ? GERD (gastroesophageal reflux disease)   ? Heart murmur   ? Hyperlipidemia   ? Hypertension   ? Low back pain   ? Obesity   ? pancreatic ca with liver mets 02/2020  ? Paraumbilical hernia   ? Pre-diabetes   ? ? ? ?Essential (primary) hypertension and Type 2 diabetes mellitus w/o complications  ? ?Family History  ?Problem Relation Age of Onset  ? Breast cancer Sister   ?     dx 65s  ? Cancer Nephew 72  ?     colon cancer  ? Colon cancer Neg Hx   ? ? ? ?Social History:  ? reports that she quit smoking about 25 years ago. Her  smoking use included cigarettes. She has a 10.00 pack-year smoking history. She has never used smokeless tobacco. She reports that she does not drink alcohol and does not use drugs. ? ?Medications ? ?Current Facility-Administered Medications:  ?  sodium chloride flush (NS) 0.9 % injection 3 mL, 3 mL, Intravenous, Once, Davonna Belling, MD ? ?Current Outpatient Medications:  ?  alendronate (FOSAMAX) 70 MG tablet, TAKE 1 TABLET BY MOUTH  EVERY 7 DAYS WITH A FULL  GLASS OF WATER ON AN EMPTY  STOMACH, Disp: 12 tablet, Rfl: 3 ?  Accu-Chek Softclix Lancets lancets, Check blood sugar once a day as instructed, Disp: 102 each, Rfl: 3 ?  albuterol (PROVENTIL) (2.5 MG/3ML) 0.083% nebulizer solution, Take 3 mLs (2.5 mg total) by nebulization every 6 (six) hours as needed for wheezing., Disp: 1080 mL, Rfl: 3 ?  albuterol (VENTOLIN HFA) 108 (90 Base) MCG/ACT inhaler, INHALE 1 TO 2 PUFFS INTO THE LUNGS EVERY 6 HOURS AS NEEDED FOR WHEEZING OR SHORTNESS OF BREATH, Disp: 54 g, Rfl: 1 ?  atorvastatin (LIPITOR) 40 MG tablet, TAKE 1 TABLET BY MOUTH EVERY DAY, Disp: 90 tablet, Rfl: 3 ?  benazepril (LOTENSIN) 20 MG tablet, Take 1 tablet (20 mg total) by mouth daily., Disp: 90 tablet, Rfl: 3 ?  Blood Glucose Monitoring Suppl (ONETOUCH VERIO REFLECT) w/Device KIT, Check blood sugar once a day as instructed, Disp: 1 kit, Rfl: 0 ?  celecoxib (CELEBREX) 100 MG capsule, TAKE 1 CAPSULE BY MOUTH  TWICE DAILY, Disp: 180 capsule, Rfl: 3 ?  cholecalciferol (VITAMIN D) 1000 units tablet, Take 2,000 Units by mouth daily., Disp: , Rfl:  ?  diclofenac Sodium (VOLTAREN) 1 % GEL, Apply 2 g topically 4 (four) times daily., Disp: 100 g, Rfl: 2 ?  diphenoxylate-atropine (LOMOTIL) 2.5-0.025 MG tablet, Take 1 tablet by mouth 4 (four) times daily as needed for diarrhea or loose stools., Disp: 30 tablet, Rfl: 0 ?  famotidine (PEPCID) 40 MG tablet, TAKE 1/2 TABLET(20 MG) BY MOUTH TWICE DAILY, Disp: 30 tablet, Rfl: 3 ?  fluticasone (FLONASE) 50 MCG/ACT nasal  spray, Place 1 spray into both nostrils daily., Disp: 16 g, Rfl: 3 ?  Fluticasone-Salmeterol (ADVAIR DISKUS) 100-50 MCG/DOSE AEPB, Inhale 1 puff into the lungs 2 (two) times daily., Disp: 60 each, Rfl: 11 ?  furosemide (LASIX) 20 MG tablet, TAKE 1 TABLET BY MOUTH EVERY DAY, ON DAY OF CHEMO AND WHILE USING 5FU INFUSING THEN TAKE 1/2 TABLET BY MOUTH DAILY, Disp: 30 tablet, Rfl: 1 ?  gabapentin (NEURONTIN) 100 MG capsule, TAKE 1 CAPSULE(100 MG) BY MOUTH THREE TIMES DAILY, Disp: 90 capsule, Rfl: 1 ?  gabapentin (NEURONTIN) 300 MG capsule, TAKE 1 CAPSULE(300 MG) BY MOUTH TWICE DAILY, Disp: 60 capsule,  Rfl: 3 ?  gabapentin (NEURONTIN) 800 MG tablet, Take 800 mg by mouth daily., Disp: , Rfl:  ?  glucose blood (ONETOUCH VERIO) test strip, Check blood sugar once a day as instructed, Disp: 100 each, Rfl: 3 ?  ipratropium (ATROVENT) 0.02 % nebulizer solution, Take 0.5 mg by nebulization every 6 (six) hours as needed for wheezing or shortness of breath., Disp: , Rfl:  ?  latanoprost (XALATAN) 0.005 % ophthalmic solution, PLACE 1 DROP INTO BOTH EYES AT BEDTIME, Disp: , Rfl: 0 ?  loperamide (LOPERAMIDE A-D) 2 MG tablet, Take 1 tablet (2 mg total) by mouth 2 (two) times daily as needed for diarrhea or loose stools., Disp: 6 tablet, Rfl: 0 ?  loratadine (CLARITIN) 10 MG tablet, Take 1 tablet (10 mg total) by mouth daily., Disp: 30 tablet, Rfl: 11 ?  magic mouthwash w/lidocaine SOLN, Take 5 mLs by mouth 4 (four) times daily as needed for mouth pain., Disp: 480 mL, Rfl: 2 ?  meclizine (ANTIVERT) 12.5 MG tablet, TAKE 1 TABLET(12.5 MG) BY MOUTH TWICE DAILY AS NEEDED FOR DIZZINESS, Disp: 15 tablet, Rfl: 3 ?  morphine (MS CONTIN) 15 MG 12 hr tablet, Take 1 tablet (15 mg total) by mouth every 12 (twelve) hours., Disp: 60 tablet, Rfl: 0 ?  olaparib (LYNPARZA) 150 MG tablet, Take 2 tablets (300 mg total) by mouth 2 (two) times daily. Swallow whole. May take with food to decrease nausea and vomiting., Disp: 120 tablet, Rfl: 1 ?  Oxycodone  HCl 10 MG TABS, Take 1 tablet (10 mg total) by mouth every 4 (four) hours as needed., Disp: 60 tablet, Rfl: 0 ?  potassium chloride SA (KLOR-CON M) 20 MEQ tablet, TAKE 1 TABLET BY MOUTH EVERY DAY, Disp: 30 tablet, Rfl

## 2021-04-13 NOTE — ED Provider Notes (Signed)
?Mariah Henderson ?Provider Note ? ? ?CSN: [id number] ?Arrival date & time: 04/13/21  1323 ? ?An emergency department physician performed an initial assessment on this suspected stroke patient at 1324. ? ?History ? ?Chief Complaint  ?Patient presents with  ? Code Stroke  ? ? ?Mariah Henderson is a 71 y.o. female. ? ?HPI ?Patient brought in as a code stroke.  Met by myself and Dr. Quinn Axe from neurology upon arrival.  Last known well about 7:00's morning.  Reportedly had some falls.  Was weak.  Reportedly some confusion and may have had more weakness on right side compared to left.  Has known stage IV pancreatic cancer. ?  ? ?Home Medications ?Prior to Admission medications   ?Medication Sig Start Date End Date Taking? Authorizing Provider  ?alendronate (FOSAMAX) 70 MG tablet TAKE 1 TABLET BY MOUTH  EVERY 7 DAYS WITH A FULL  GLASS OF WATER ON AN EMPTY  STOMACH 12/27/20   Sid Falcon, MD  ?Accu-Chek Softclix Lancets lancets Check blood sugar once a day as instructed 11/25/20   Aldine Contes, MD  ?albuterol (PROVENTIL) (2.5 MG/3ML) 0.083% nebulizer solution Take 3 mLs (2.5 mg total) by nebulization every 6 (six) hours as needed for wheezing. 02/22/18   Velna Ochs, MD  ?albuterol (VENTOLIN HFA) 108 (90 Base) MCG/ACT inhaler INHALE 1 TO 2 PUFFS INTO THE LUNGS EVERY 6 HOURS AS NEEDED FOR WHEEZING OR SHORTNESS OF BREATH 03/16/20   Sid Falcon, MD  ?atorvastatin (LIPITOR) 40 MG tablet TAKE 1 TABLET BY MOUTH EVERY DAY 06/03/20   Sid Falcon, MD  ?benazepril (LOTENSIN) 20 MG tablet Take 1 tablet (20 mg total) by mouth daily. 01/25/21   Rick Duff, MD  ?Blood Glucose Monitoring [facility] VERIO REFLECT) w/Device KIT Check blood sugar once a day as instructed 12/24/20   Sid Falcon, MD  ?celecoxib (CELEBREX) 100 MG capsule TAKE 1 CAPSULE BY MOUTH  TWICE DAILY 03/16/20   Sid Falcon, MD  ?cholecalciferol (VITAMIN D) 1000 units tablet Take 2,000 Units by mouth daily.     [provider]  ?diclofenac Sodium (VOLTAREN) 1 % GEL Apply 2 g topically 4 (four) times daily. 09/05/19   Sid Falcon, MD  ?diphenoxylate-atropine (LOMOTIL) 2.5-0.025 MG tablet Take 1 tablet by mouth 4 (four) times daily as needed for diarrhea or loose stools. 01/12/21   Sid Falcon, MD  ?famotidine (PEPCID) 40 MG tablet TAKE 1/2 TABLET(20 MG) BY MOUTH TWICE DAILY 01/14/20   Sid Falcon, MD  ?fluticasone (FLONASE) 50 MCG/ACT nasal spray Place 1 spray into both nostrils daily. 01/14/20   Sid Falcon, MD  ?Fluticasone-Salmeterol (ADVAIR DISKUS) 100-50 MCG/DOSE AEPB Inhale 1 puff into the lungs 2 (two) times daily. 01/14/20 01/13/21  Sid Falcon, MD  ?furosemide (LASIX) 20 MG tablet TAKE 1 TABLET BY MOUTH EVERY DAY, ON DAY OF CHEMO AND WHILE USING 5FU INFUSING THEN TAKE 1/2 TABLET BY MOUTH DAILY 03/17/21   Truitt Merle, MD  ?gabapentin (NEURONTIN) 100 MG capsule TAKE 1 CAPSULE(100 MG) BY MOUTH THREE TIMES DAILY 12/30/20   Truitt Merle, MD  ?gabapentin (NEURONTIN) 300 MG capsule TAKE 1 CAPSULE(300 MG) BY MOUTH TWICE DAILY 02/14/21   Ventura Sellers, MD  ?gabapentin (NEURONTIN) 800 MG tablet Take 800 mg by mouth daily. 09/29/20   [provider]  ?glucose blood (ONETOUCH VERIO) test strip Check blood sugar once a day as instructed 11/25/20   Aldine Contes, MD  ?ipratropium (ATROVENT) 0.02 % nebulizer  solution Take 0.5 mg by nebulization every 6 (six) hours as needed for wheezing or shortness of breath.    [provider]  ?latanoprost (XALATAN) 0.005 % ophthalmic solution PLACE 1 DROP INTO BOTH EYES AT BEDTIME 09/28/17   [provider]  ?loperamide (LOPERAMIDE A-D) 2 MG tablet Take 1 tablet (2 mg total) by mouth 2 (two) times daily as needed for diarrhea or loose stools. 08/31/17   Welford Roche, MD  ?loratadine (CLARITIN) 10 MG tablet Take 1 tablet (10 mg total) by mouth daily. 01/14/20   Sid Falcon, MD  ?magic mouthwash w/lidocaine SOLN Take 5 mLs by  mouth 4 (four) times daily as needed for mouth pain. 03/30/20   Truitt Merle, MD  ?meclizine (ANTIVERT) 12.5 MG tablet TAKE 1 TABLET(12.5 MG) BY MOUTH TWICE DAILY AS NEEDED FOR DIZZINESS 03/30/20   Maudie Mercury, MD  ?morphine (MS CONTIN) 15 MG 12 hr tablet Take 1 tablet (15 mg total) by mouth every 12 (twelve) hours. 04/06/21   Pickenpack-Cousar, Carlena Sax, NP  ?olaparib (LYNPARZA) 150 MG tablet Take 2 tablets (300 mg total) by mouth 2 (two) times daily. Swallow whole. May take with food to decrease nausea and vomiting. 04/06/21   Truitt Merle, MD  ?Oxycodone HCl 10 MG TABS Take 1 tablet (10 mg total) by mouth every 4 (four) hours as needed. 03/23/21   Truitt Merle, MD  ?potassium chloride SA (KLOR-CON M) 20 MEQ tablet TAKE 1 TABLET BY MOUTH EVERY DAY 04/11/21   Sid Falcon, MD  ?prochlorperazine (COMPAZINE) 10 MG tablet TAKE 1 TABLET(10 MG) BY MOUTH EVERY 6 HOURS AS NEEDED FOR NAUSEA OR VOMITING 01/14/21   Truitt Merle, MD  ?risperiDONE (RISPERDAL) 0.25 MG tablet Take 0.25 mg by mouth daily as needed. 08/08/17   [provider]  ?rivaroxaban (XARELTO) 20 MG TABS tablet Take 1 tablet (20 mg total) by mouth daily with supper. 11/30/20   Truitt Merle, MD  ?sertraline (ZOLOFT) 50 MG tablet Take 50 mg by mouth daily. 02/04/20   [provider]  ?traMADol (ULTRAM) 50 MG tablet Take 1 tablet (50 mg total) by mouth every 8 (eight) hours as needed. 02/02/21   Truitt Merle, MD  ?traZODone (DESYREL) 50 MG tablet Take 25-50 mg by mouth at bedtime as needed for sleep.     [provider]  ?triamcinolone cream (KENALOG) 0.1 % apply to affected area twice a day 01/22/17   Sid Falcon, MD  ?   ? ?Allergies    ?Patient has no known allergies.   ? ?Review of Systems   ?Review of Systems  ?Neurological:  Positive for weakness.  ?Psychiatric/Behavioral:  Positive for confusion.   ? ?Physical Exam ?Updated Vital Signs ?BP 126/84   Pulse 93   Temp 98.3 ?F (36.8 ?C)   Resp 17   Wt 51.6 kg   LMP 04/29/1966 (LMP Unknown)    SpO2 97%   BMI 22.22 kg/m?  ?Physical Exam ?Vitals and nursing note reviewed.  ?HENT:  ?   Head: Normocephalic.  ?Eyes:  ?   Pupils: Pupils are equal, round, and reactive to light.  ?Cardiovascular:  ?   Rate and Rhythm: Normal rate.  ?Pulmonary:  ?   Comments: Mildly harsh breath sounds ?Abdominal:  ?   Tenderness: There is no abdominal tenderness.  ?Musculoskeletal:     ?   General: No tenderness.  ?Neurological:  ?   Mental Status: She is alert.  ?   Comments: Somewhat sedate.  Decreased mental  status.  May be moving right side less than left.  Eyes will cross midline however.  Still able to speech some confusion.  Complete NIH scoring done by neurology  ? ? ?ED Results / Procedures / Treatments   ?Labs ?(all labs ordered are listed, but only abnormal results are displayed) ?Labs Reviewed  ?PROTIME-INR - Abnormal; Notable for the following components:  ?    Result Value  ? Prothrombin Time 17.5 (*)   ? INR 1.4 (*)   ? All other components within normal limits  ?CBC - Abnormal; Notable for the following components:  ? WBC 15.6 (*)   ? Hemoglobin 11.6 (*)   ? MCH 23.8 (*)   ? MCHC 29.4 (*)   ? RDW 27.9 (*)   ? All other components within normal limits  ?COMPREHENSIVE METABOLIC PANEL - Abnormal; Notable for the following components:  ? Chloride 97 (*)   ? Glucose, Bld 187 (*)   ? Total Protein 5.8 (*)   ? Albumin 2.2 (*)   ? AST 64 (*)   ? Alkaline Phosphatase 179 (*)   ? Total Bilirubin 2.3 (*)   ? All other components within normal limits  ?I-STAT CHEM 8, ED - Abnormal; Notable for the following components:  ? Chloride 96 (*)   ? Glucose, Bld 190 (*)   ? TCO2 35 (*)   ? All other components within normal limits  ?CBG MONITORING, ED - Abnormal; Notable for the following components:  ? Glucose-Capillary 183 (*)   ? All other components within normal limits  ?APTT  ?DIFFERENTIAL  ?URINALYSIS, ROUTINE W REFLEX MICROSCOPIC  ?BRAIN NATRIURETIC PEPTIDE  ? ? ?EKG ?EKG Interpretation ? ?Date/Time:  Wednesday April 13 2021  13:42:17 EDT ?Ventricular Rate:  121 ?PR Interval:  138 ?QRS Duration: 91 ?QT Interval:  329 ?QTC Calculation: 467 ?R Axis:   -28 ?Text Interpretation: Sinus tachycardia Ventricular premature complex [place]

## 2021-04-13 NOTE — ED Triage Notes (Signed)
Arrived via EMS; concern for Stroke. Reported [place] approx 0700; patient was found to have righ sided deficits and aphasia.  ?

## 2021-04-13 NOTE — Progress Notes (Signed)
Not available for EEG at the moment ,per nurse,  tech will check back  later tonight. ?

## 2021-04-13 NOTE — Hospital Course (Addendum)
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 Initial work-up showed elevated LFTs likely from liver metastasis, WBC at 15.6, CXR showed small bilateral pleural effusion, Echocardiogram showed grade 1 diastolic dysfunction with LVEF 65 to 70% with no wall motion abnormalities. CT head/neck showed multiple scattered pulmonary nodules within visualized lungs and 1.4cm soft tissue mass at the posterior aspect of right parotid gland. WBC trended up each day to 25.9 on 4/5. 1/4 blood cultures obtained on admission showed growth. IV Vancomycin started. Culture positive for methicillin-resistant staphylococcus epidermidis. Vancomycin discontinued. Repeat blood cultures negative. Patient remained afebrile. Palliative care consulted and family meeting held on 4/1 to clarify goals of care. Confirmed DNAR/DNI status. Patient requested no feeding tube. Pain medication discontinued changed to as needed due to concern of drowsiness. Patient was tachycardic in the setting of not receiving pain medication. Patient continued to have decreased functional status. Oncology and Palliative Care evaluated patient along with IM team. Discussed with family that leukocytosis and patient's decrease in function was likely progression of malignancy. Patient remains afebrile, hemodynamically stable, and not showing signs of infection. Oncology recommended placement at hospice with shift to comfort care due to progression of malignancy and decline in patient's status. Discussed plan with family members who are agreeable with plan. ? ?#Acute encephalopathy ?Patient presented with altered mental status and possible right-sided weakness. Code stroke called and patient evaluated in ED by neurology. CT head showed no acute hemorrhage. MRI brain showed no acute intracranial abnormality. Initial work-up  negative for stroke. EEG suggestive of moderate diffuse encephalopathy.  ? ?#Constipation ?Patient is on chronic opioid medication and endorses constipation. Patient started on Miralax and Sennakot-S with improvement in constipation. ? ? ? ?

## 2021-04-13 NOTE — H&P (Signed)
?Date: 04/13/2021     ?     ?     ?Patient Name:  Mariah Henderson MRN: [id number]  ?DOB: [birth year] Age / Sex: 71 y.o., female   ?PCP: Sid Falcon, MD    ?     ?     ?Medical Service: Internal Medicine Teaching Service    ?     ?     ?Attending Physician: Dr. Heber Morrill, Rachel Moulds, DO    ?First Contact: Nelva Nay, MS3 Pager: [phone]  ?Second Contact: Dr. Raymondo Band Pager: [phone]  ?Third Contact Dr. Coy Saunas Pager: [phone]  ?     ?After Hours (After 5p/  First Contact Pager: [phone]  ?weekends / holidays): Second Contact Pager: [phone]  ? ?Chief Complaint: Altered mental status ? ?History of Present Illness: Charlynn Salih is a 71 y.o. female with a past medical history of pancreatic cancer with metastases to the liver and lungs, HTN, HLD, GERD, T2DM, and COPD who presented to the ED after being found unresponsive at home. Her sister, [name], was at bedside and provided history due to patient's mental status. ? ?This morning, her niece spoke to her at 54 AM and their conversation was normal, but toward the end of her conversations, her speech seemed "crazy". [name], who lives with her, called later that morning and reported that she was not making sense and was talking "out of her head". Her speech just didn't make sense, although the speech was clear.  Because of this, her sister, [name], went to check in and found her unresponsive in bed.  ? ?Her sister reports that the only thing that changed in [place] routine was a new cancer medication, Lonie Peak, that she started last night. She took the first dose with dinner yesterday and the second dose this morning. She took all of her usual medications this morning, except for morphine. She was diagnosed January 2022 with pancreatic cancer, with subsequent mets to liver and lung. Chemotherapy did not seem to be working for liver and lung nodules, so she switched to oral medication. She has right-sided abdominal pain due to liver mets. In addition, she has had loss of appetite  and difficulty swallowing solid foods. She has lost 25-30 lbs in the past month.  ? ?She is on gabapentin for nerve damage and has issues with right hand nerves, so she can't hold things. ? ?Meds:  ?Olaparib (Lynparza) 100 mg BID ?Famotidine 20 mg ?Lasix 20 mg ?Benazepril 20 mg ?Xarelto 20 mg ?Trazodone 50 mg ?Lomotil 2.5-0.025 mg PRN  ?Oxycodone 10 mg Q4H PRN ?Gabapentin 300 mg BID ?Prochlorperazine 10 mg PRN ?KCl 20 mEq ?Morphine 15 mg BID ?Lipitor 40 mg ? ?Allergies: ?Allergies as of 04/13/2021  ? (No Known Allergies)  ? ?Past Medical History:  ?Diagnosis Date  ? Acute medial meniscus tear   ? right knee  ? Anxiety   ? Asthma   ? Cervical spondylosis   ? Chronic serous otitis media   ? Constipation   ? COPD (chronic obstructive pulmonary disease) ([place])   ? Depression   ? followed by [name]; no longer of depakote, seroquel  ? Diabetes mellitus without complication ([place])   ? Dysuria 12/27/2009  ? Qualifier: Diagnosis of  By: [name] MD, [place]    ? Foot drop   ? GERD (gastroesophageal reflux disease)   ? Heart murmur   ? Hyperlipidemia   ? Hypertension   ? Low back pain   ? Obesity   ? pancreatic ca with liver  mets 02/2020  ? Paraumbilical hernia   ? Pre-diabetes   ? ?Past Surgical History:  ?Procedure Laterality Date  ? ABDOMINAL HYSTERECTOMY    ? ANTERIOR CERVICAL DECOMP/DISCECTOMY FUSION N/A 10/05/2014  ? Procedure: ACDF - C5-C6 - C6-C7;  Surgeon: Karie Chimera, MD;  Location: [place] [facility];  Service: Neurosurgery;  Laterality: N/A;  ACDF - C5-C6 - C6-C7  ? CHOLECYSTECTOMY    ? COLONOSCOPY    ? FOOT SURGERY    ? HERNIA REPAIR  1999  ? IR IMAGING GUIDED PORT INSERTION  02/25/2020  ? IR US GUIDANCE  01/28/2020  ? KNEE ARTHROSCOPY WITH MEDIAL MENISECTOMY Right 06/14/2016  ? Procedure: RIGHT KNEE ARTHROSCOPY WITH PARTIAL MEDIAL MENISCECTOMY, SUBCHONDROPLASTY;  Surgeon: Leandrew Koyanagi, MD;  Location: [place] [facility];  Service: Orthopedics;  Laterality: Right;  ? KNEE ARTHROSCOPY WITH SUBCHONDROPLASTY Right  06/14/2016  ? Procedure: KNEE ARTHROSCOPY WITH SUBCHONDROPLASTY;  Surgeon: Leandrew Koyanagi, MD;  Location: [place];  Service: Orthopedics;  Laterality: Right;  ? RADIOACTIVE SEED GUIDED EXCISIONAL BREAST BIOPSY Left 06/23/2014  ? Procedure: RADIOACTIVE SEED GUIDED EXCISIONAL BREAST BIOPSY;  Surgeon: Stark Klein, MD;  Location: [place];  Service: General;  Laterality: Left;  ? ? ?Family History: breast cancer, cardiovascular disease, stroke, alzheimer's ?Family History  ?Problem Relation Age of Onset  ? Breast cancer Sister   ?     dx 69s  ? Cancer Nephew 23  ?     colon cancer  ? Colon cancer Neg Hx   ? ? ?Social History: She currently lives at home with Lenna Sciara (who is basically like family) who has lived with her since November. Her sister, [name], and her niece are her caretakers. Her Son currently lives in [place], but is her Media planner. She is very independent, and takes care of herself at home. She has a 10 pack-year smoking history and quit 25 years ago. She rarely drinks alcohol. No other substance use. Her PCP is Dr. Daryll Drown [facility]). Her oncologist is Dr. Burr Medico. ? ?Review of Systems: ?Review of systems not obtained due to patient factors. ? ?Physical Exam: ?Blood pressure 129/88, pulse (!) 118, temperature 98.3 ?F (36.8 ?C), resp. rate 16, weight 51.6 kg, last menstrual period 04/29/1966, SpO2 96 %. ?Constitutional: Chronically ill elderly woman. Drowsy, laying in bed. NAD ?HENT: Normocephalic, atraumatic. Dry mucous membrane ?Eyes: Pupils, equal, round, reactive to light. ?Neck: No tenderness to palpation over right clavicular notch. ?Cardio: Tachycardic, regular rhythm. No murmurs, rubs, or gallops appreciated. ?Pulm: Referred breath sounds. Normal work of breathing. No wheezes or crackles. ?Abdomen: Soft, non-distended. Palpable liver edge below the costal margin. ?Skin: Warm and dry. ?Neuro: Patient awakens to sternal rub, but somnolent and unable to follow directions  consistently. Responding to some questions. ? ?Lab results: ? ?  Latest Ref Rng & Units 04/13/2021  ?  1:32 PM 04/13/2021  ?  1:27 PM 04/06/2021  ?  8:59 AM  ?CBC  ?WBC 4.0 - 10.5 K/uL  15.6   12.9    ?Hemoglobin 12.0 - 15.0 g/dL 13.6   11.6   10.9    ?Hematocrit 36.0 - 46.0 % 40.0   39.4   34.8    ?Platelets 150 - 400 K/uL  203   264    ? ? ?  Latest Ref Rng & Units 04/13/2021  ?  1:32 PM 04/13/2021  ?  1:27 PM 04/06/2021  ?  8:59 AM  ?CMP  ?Glucose 70 - 99 mg/dL 190  187   227    ?BUN 8 - 23 mg/dL '18   14   14    '$ ?Creatinine 0.44 - 1.00 mg/dL 0.60   0.70   0.53    ?Sodium 135 - 145 mmol/L 135   135   136    ?Potassium 3.5 - 5.1 mmol/L 4.9   4.5   3.8    ?Chloride 98 - 111 mmol/L 96   97   96    ?CO2 22 - 32 mmol/L  30   33    ?Calcium 8.9 - 10.3 mg/dL  9.8   9.8    ?Total Protein 6.5 - 8.1 g/dL  5.8   6.2    ?Total Bilirubin 0.3 - 1.2 mg/dL  2.3   1.8    ?Alkaline Phos 38 - 126 U/L  179   203    ?AST 15 - 41 U/L  64   37    ?ALT 0 - 44 U/L  44   41    ? ?Urinalysis ?   ?Component Value Date/Time  ? [place] (A) 04/13/2021 1547  ? APPEARANCEUR HAZY (A) 04/13/2021 1547  ? LABSPEC >1.030 (H) 04/13/2021 1547  ? PHURINE 5.5 04/13/2021 1547  ? GLUCOSEU 100 (A) 04/13/2021 1547  ? [place] NEGATIVE 04/13/2021 1547  ? BILIRUBINUR MODERATE (A) 04/13/2021 1547  ? [place] NEGATIVE 04/13/2021 1547  ? PROTEINUR 30 (A) 04/13/2021 1547  ? NITRITE NEGATIVE 04/13/2021 1547  ? LEUKOCYTESUR NEGATIVE 04/13/2021 1547  ? ?aPTT 26 wnl ?PT 17.5 ?INR 1.4 ?CBG 183 ?BNP 49.9 wnl ?Blood culture pending ? ?Imaging results:  ?DG Chest Portable 1 View ? ?Result Date: 04/13/2021 ?CLINICAL DATA:  Altered mental status. EXAM: PORTABLE CHEST 1 VIEW COMPARISON:  12/01/2015 and CT chest 02/28/2021. FINDINGS: Right IJ power port tip is in the SVC. Heart size stable. Mixed interstitial and airspace opacities bilaterally with bilateral pleural effusions, left greater than right. IMPRESSION: 1. Mixed interstitial and airspace opacities bilaterally,  likely a combination pulmonary edema and underlying metastatic disease when compared with CT chest 02/28/2021. Difficult to exclude pneumonia. 2. Small bilateral pleural effusions. Electronically Signed   By: Lindon Romp

## 2021-04-13 NOTE — ED Notes (Signed)
Family at bedside with patient at this time.

## 2021-04-13 NOTE — Progress Notes (Signed)
SLP Cancellation Note ? ?Patient Details ?Name: Mariah Henderson ?MRN: [id number] ?DOB: [birth year] ? ? ?Cancelled treatment:       Reason Eval/Treat Not Completed: Patient's level of consciousness;Other (comment) (Reached out to RN to see if patient ready for speech swallow evaluation but she reported patient is still too lethargic. SLP will follow up in AM next date.) ? ? ?Sonia Baller, MA, CCC-SLP ?Speech Therapy ? ?

## 2021-04-14 ENCOUNTER — Observation Stay (HOSPITAL_COMMUNITY): Payer: Medicare Other

## 2021-04-14 DIAGNOSIS — I6523 Occlusion and stenosis of bilateral carotid arteries: Secondary | ICD-10-CM | POA: Diagnosis not present

## 2021-04-14 DIAGNOSIS — C259 Malignant neoplasm of pancreas, unspecified: Secondary | ICD-10-CM

## 2021-04-14 DIAGNOSIS — Z66 Do not resuscitate: Secondary | ICD-10-CM | POA: Diagnosis not present

## 2021-04-14 DIAGNOSIS — Z515 Encounter for palliative care: Secondary | ICD-10-CM

## 2021-04-14 DIAGNOSIS — I6622 Occlusion and stenosis of left posterior cerebral artery: Secondary | ICD-10-CM | POA: Diagnosis not present

## 2021-04-14 DIAGNOSIS — G9340 Encephalopathy, unspecified: Secondary | ICD-10-CM | POA: Diagnosis not present

## 2021-04-14 DIAGNOSIS — C799 Secondary malignant neoplasm of unspecified site: Secondary | ICD-10-CM

## 2021-04-14 DIAGNOSIS — C787 Secondary malignant neoplasm of liver and intrahepatic bile duct: Secondary | ICD-10-CM

## 2021-04-14 DIAGNOSIS — I771 Stricture of artery: Secondary | ICD-10-CM | POA: Diagnosis not present

## 2021-04-14 DIAGNOSIS — L8990 Pressure ulcer of unspecified site, unspecified stage: Secondary | ICD-10-CM | POA: Insufficient documentation

## 2021-04-14 DIAGNOSIS — G319 Degenerative disease of nervous system, unspecified: Secondary | ICD-10-CM | POA: Diagnosis not present

## 2021-04-14 DIAGNOSIS — G9341 Metabolic encephalopathy: Secondary | ICD-10-CM | POA: Diagnosis not present

## 2021-04-14 DIAGNOSIS — Z7189 Other specified counseling: Secondary | ICD-10-CM | POA: Diagnosis not present

## 2021-04-14 DIAGNOSIS — C7800 Secondary malignant neoplasm of unspecified lung: Secondary | ICD-10-CM

## 2021-04-14 LAB — BASIC METABOLIC PANEL
Anion gap: 7 (ref 5–15)
BUN: 12 mg/dL (ref 8–23)
CO2: 33 mmol/L — ABNORMAL HIGH (ref 22–32)
Calcium: 9.8 mg/dL (ref 8.9–10.3)
Chloride: 98 mmol/L (ref 98–111)
Creatinine, Ser: 0.61 mg/dL (ref 0.44–1.00)
GFR, Estimated: >60 mL/min (ref >60)
Glucose, Bld: 123 mg/dL — ABNORMAL HIGH (ref 70–99)
Potassium: 4.3 mmol/L (ref 3.5–5.1)
Sodium: 138 mmol/L (ref 135–145)

## 2021-04-14 LAB — CBC
HCT: 38.8 % (ref 36.0–46.0)
Hemoglobin: 11.4 g/dL — ABNORMAL LOW (ref 12.0–15.0)
MCH: 23.8 pg — ABNORMAL LOW (ref 26.0–34.0)
MCHC: 29.4 g/dL — ABNORMAL LOW (ref 30.0–36.0)
MCV: 80.8 fL (ref 80.0–100.0)
Platelets: 155 10*3/uL (ref 150–400)
RBC: 4.8 MIL/uL (ref 3.87–5.11)
RDW: 27.6 % — ABNORMAL HIGH (ref 11.5–15.5)
WBC: 16.3 10*3/uL — ABNORMAL HIGH (ref 4.0–10.5)
nRBC: 0 % (ref 0.0–0.2)

## 2021-04-14 MED ORDER — MEDIHONEY WOUND/BURN DRESSING EX PSTE
1.0000 "application " | PASTE | Freq: Every day | CUTANEOUS | Status: DC
  Administered 2021-04-14 – 2021-04-21 (×7): 1 via TOPICAL
  Filled 2021-04-14: qty 44

## 2021-04-14 MED ORDER — MORPHINE SULFATE ER 15 MG PO TBCR
15.0000 mg | EXTENDED_RELEASE_TABLET | Freq: Two times a day (BID) | ORAL | Status: DC
  Administered 2021-04-14 – 2021-04-15 (×3): 15 mg via ORAL
  Filled 2021-04-14 (×4): qty 1

## 2021-04-14 MED ORDER — MOMETASONE FURO-FORMOTEROL FUM 100-5 MCG/ACT IN AERO
2.0000 | INHALATION_SPRAY | Freq: Two times a day (BID) | RESPIRATORY_TRACT | Status: DC
  Administered 2021-04-15 – 2021-04-21 (×9): 2 via RESPIRATORY_TRACT
  Filled 2021-04-14 (×2): qty 8.8

## 2021-04-14 MED ORDER — SENNOSIDES-DOCUSATE SODIUM 8.6-50 MG PO TABS: 1.0000 | ORAL_TABLET | Freq: Every evening | ORAL | Status: DC | PRN

## 2021-04-14 MED ORDER — RIVAROXABAN 20 MG PO TABS
20.0000 mg | ORAL_TABLET | Freq: Every day | ORAL | Status: DC
  Administered 2021-04-14 – 2021-04-19 (×6): 20 mg via ORAL
  Filled 2021-04-14 (×6): qty 1

## 2021-04-14 MED ORDER — ALBUTEROL SULFATE HFA 108 (90 BASE) MCG/ACT IN AERS: 1.0000 | INHALATION_SPRAY | Freq: Four times a day (QID) | RESPIRATORY_TRACT | Status: DC | PRN

## 2021-04-14 MED ORDER — ALBUTEROL SULFATE (2.5 MG/3ML) 0.083% IN NEBU: 2.5000 mg | INHALATION_SOLUTION | Freq: Four times a day (QID) | RESPIRATORY_TRACT | Status: DC | PRN

## 2021-04-14 MED ORDER — ATORVASTATIN CALCIUM 40 MG PO TABS
40.0000 mg | ORAL_TABLET | Freq: Every day | ORAL | Status: DC
  Administered 2021-04-14 – 2021-04-18 (×5): 40 mg via ORAL
  Filled 2021-04-14 (×6): qty 1

## 2021-04-14 MED ORDER — GABAPENTIN 300 MG PO CAPS
300.0000 mg | ORAL_CAPSULE | Freq: Two times a day (BID) | ORAL | Status: DC
  Administered 2021-04-14 – 2021-04-15 (×4): 300 mg via ORAL
  Filled 2021-04-14 (×5): qty 1

## 2021-04-14 MED ORDER — POLYETHYLENE GLYCOL 3350 17 G PO PACK
17.0000 g | PACK | Freq: Every day | ORAL | Status: DC
  Administered 2021-04-14 – 2021-04-18 (×5): 17 g via ORAL
  Filled 2021-04-14 (×5): qty 1

## 2021-04-14 MED ORDER — TRAZODONE HCL 50 MG PO TABS: 25.0000 mg | ORAL_TABLET | Freq: Every evening | ORAL | Status: DC | PRN

## 2021-04-14 MED ORDER — IOHEXOL 350 MG/ML SOLN
60.0000 mL | Freq: Once | INTRAVENOUS | Status: AC | PRN
  Administered 2021-04-14: 60 mL via INTRAVENOUS

## 2021-04-14 MED ORDER — SERTRALINE HCL 50 MG PO TABS
50.0000 mg | ORAL_TABLET | Freq: Every day | ORAL | Status: DC
Start: 2021-04-14 — End: 2021-04-17
  Administered 2021-04-14 – 2021-04-17 (×4): 50 mg via ORAL
  Filled 2021-04-14 (×4): qty 1

## 2021-04-14 MED ORDER — CELECOXIB 100 MG PO CAPS
100.0000 mg | ORAL_CAPSULE | Freq: Two times a day (BID) | ORAL | Status: DC
  Administered 2021-04-14 – 2021-04-15 (×4): 100 mg via ORAL
  Filled 2021-04-14 (×5): qty 1

## 2021-04-14 NOTE — Progress Notes (Incomplete)
STROKE TEAM PROGRESS NOTE  ? ?INTERVAL HISTORY ?No family at bedside.  ?Her *** is at the bedside.  *** ? ?Patient had no further concerns. ? ?Vitals:  ? 04/13/21 1950 04/13/21 2307 04/14/21 0340 04/14/21 0746  ?BP: 123/70 110/75 108/66 127/87  ?Pulse: 89 95 93 93  ?Resp: '14 18  19  '$ ?Temp: 97.6 ?F (36.4 ?C) 97.6 ?F (36.4 ?C) 98 ?F (36.7 ?C) 97.9 ?F (36.6 ?C)  ?TempSrc: Oral Oral Oral Oral  ?SpO2: 100% 100% 98% 98%  ?Weight:      ?Height:      ? ?CBC:  ?Recent Labs  ?Lab 04/13/21 ?1327 04/13/21 ?1332 04/14/21 ?0451  ?WBC 15.6*  --  16.3*  ?NEUTROABS 15.0*  --   --   ?HGB 11.6* 13.6 11.4*  ?HCT 39.4 40.0 38.8  ?MCV 80.7  --  80.8  ?PLT 203  --  155  ? ?Basic Metabolic Panel:  ?Recent Labs  ?Lab 04/13/21 ?1327 04/13/21 ?1332 04/13/21 ?1645 04/14/21 ?0451  ?NA 135 135  --  138  ?K 4.5 4.9  --  4.3  ?CL 97* 96*  --  98  ?CO2 30  --   --  33*  ?GLUCOSE 187* 190*  --  123*  ?BUN 14 18  --  12  ?CREATININE 0.70 0.60  --  0.61  ?CALCIUM 9.8  --   --  9.8  ?MG  --   --  1.8  --   ? ?Lipid Panel:  ?No results for input(s): CHOL, TRIG, HDL, CHOLHDL, VLDL, LDLCALC in the last 168 hours. ?HgbA1c:  ?No results for input(s): HGBA1C in the last 168 hours. ?Urine Drug Screen:  ?No results for input(s): LABOPIA, COCAINSCRNUR, LABBENZ, AMPHETMU, THCU, LABBARB in the last 168 hours. ?Alcohol Level No results for input(s): ETH in the last 168 hours. ? ?IMAGING past 24 hours ?CT ANGIO HEAD NECK W WO CM ? ?Result Date: 04/14/2021 ?CLINICAL DATA:  Follow-up examination for stroke. EXAM: CT ANGIOGRAPHY HEAD AND NECK TECHNIQUE: Multidetector CT imaging of the head and neck was performed using the standard protocol during bolus administration of intravenous contrast. Multiplanar CT image reconstructions and MIPs were obtained to evaluate the vascular anatomy. Carotid stenosis measurements (when applicable) are obtained utilizing NASCET criteria, using the distal internal carotid diameter as the denominator. RADIATION DOSE REDUCTION: This exam  was performed according to the departmental dose-optimization program which includes automated exposure control, adjustment of the mA and/or kV according to patient size and/or use of iterative reconstruction technique. CONTRAST:  24m OMNIPAQUE IOHEXOL 350 MG/ML SOLN COMPARISON:  CT and MRI from 04/13/2021. FINDINGS: CT HEAD FINDINGS Brain: Age-related cerebral atrophy with moderate chronic microvascular ischemic disease. No acute intracranial hemorrhage or large vessel territory infarct. No mass lesion or midline shift. No hydrocephalus or extra-axial fluid collection. Vascular: No hyperdense vessel. Skull: Scalp soft tissues and calvarium within normal limits. Sinuses: Clear. Orbits: Globes orbital soft tissues demonstrate no acute finding. Review of the MIP images confirms the above findings CTA NECK FINDINGS Aortic arch: Visualized aortic arch normal caliber with normal branch pattern. Mild atheromatous change about the origin the great vessels without significant stenosis. Right carotid system: Right common and internal carotid arteries tortuous but widely patent without stenosis or dissection. Minimal plaque about the right carotid bulb without stenosis. Left carotid system: Left common and internal carotid arteries tortuous but patent without stenosis or dissection. Minimal plaque about the left carotid bulb without stenosis. Vertebral arteries: Both vertebral arteries arise from subclavian arteries.  No proximal subclavian artery stenosis. Proximal left V1 and V2 segments not well seen due to streak artifact from cervical ACDF. Visualized portions of the vertebral arteries patent without stenosis or dissection. Skeleton: No discrete or worrisome osseous lesions. Prior ACDF at C5-6 and C6-7. Other neck: No other acute soft tissue abnormality within the neck. 1.4 cm soft tissue mass present at the posterior aspect of the right parotid gland (series 12, image 83), indeterminate. Upper chest: Advanced emphysema.  Known metastatic disease with scattered pulmonary nodules noted, partially visualized. Layering right pleural effusion with associated atelectasis. Right-sided Port-A-Cath in place. Layering secretions noted within the subglottic trachea. Review of the MIP images confirms the above findings CTA HEAD FINDINGS Anterior circulation: Petrous segments patent bilaterally. Mild for age atheromatous change within the carotid siphons without significant stenosis. A1 segments, anterior communicating artery complex, and anterior cerebral arteries patent without abnormality or stenosis. M1 segments widely patent. No proximal MCA branch occlusion. Distal MCA branches perfused and symmetric. Posterior circulation: Both V4 segments patent without stenosis. Left PICA patent. Right PICA not well seen. Basilar patent to its distal aspect without stenosis. Superior cerebellar and posterior cerebral arteries patent bilaterally without significant stenosis. Short-segment mild stenosis noted at the junction of the left P1/P2 segments. Venous sinuses: Not well assessed due to arterial timing the contrast bolus. Anatomic variants: None significant.  No aneurysm. Review of the MIP images confirms the above findings IMPRESSION: 1. Negative CTA for large vessel occlusion or other emergent finding. 2. Mild for age atheromatous disease. No hemodynamically significant or correctable stenosis. 3. Diffuse tortuosity of the major arterial vasculature of the head and neck, suggesting chronic underlying hypertension. 4. Multiple scattered pulmonary nodules within the visualized lungs, consistent with known metastatic disease. 5. Layering right pleural effusion with associated atelectasis, partially visualized. 6. 1.4 cm soft tissue mass at the posterior aspect of the right parotid gland, indeterminate. Outpatient ENT consultation and referral for further workup and evaluation could be performed as warranted. Emphysema (ICD10-J43.9). Electronically  Signed   By: Jeannine Boga M.D.   On: 04/14/2021 03:25  ? ?MR BRAIN WO CONTRAST ? ?Result Date: 04/13/2021 ?CLINICAL DATA:  Initial evaluation for neuro deficit, stroke suspected. EXAM: MRI HEAD WITHOUT CONTRAST TECHNIQUE: Multiplanar, multiecho pulse sequences of the brain and surrounding structures were obtained without intravenous contrast. COMPARISON:  Prior CT from earlier the same day. FINDINGS: Brain: Diffuse prominence of the CSF containing spaces compatible generalized age-related cerebral atrophy. Patchy and confluent T2/FLAIR hyperintensity involving the periventricular deep white matter both cerebral hemispheres most consistent with chronic small vessel ischemic disease, moderate in nature. No evidence for acute or subacute infarct. Gray-white matter differentiation maintained. No areas of chronic cortical infarction. No acute or chronic intracranial blood products. No mass lesion, mass effect or midline shift. No hydrocephalus or extra-axial fluid collection. Pituitary gland suprasellar region normal. Midline structures intact. Vascular: Major intracranial vascular flow voids are well maintained. Skull and upper cervical spine: Craniocervical junction within normal limits. Bone marrow signal intensity normal. No scalp soft tissue abnormality. Sinuses/Orbits: Prior ocular lens replacement on the left. Globes and orbital soft tissues demonstrate no acute finding. Paranasal sinuses and mastoid air cells are largely clear. Other: 1.4 cm T2 hypointense lesion seen at the deep/posterior aspect of the right parotid gland (series 10, image 11), indeterminate. IMPRESSION: 1. No acute intracranial abnormality. 2. Generalized age-related cerebral atrophy with moderate chronic small vessel ischemic disease. 3. 1.4 cm T2 hypointense lesion involving the deep/posterior aspect of the right parotid  gland, indeterminate. Nonemergent outpatient ENT consultation and referral suggested for further workup and  evaluation. Electronically Signed   By: Jeannine Boga M.D.   On: 04/13/2021 19:32  ? ?DG Chest Portable 1 View ? ?Result Date: 04/13/2021 ?CLINICAL DATA:  Altered mental status. EXAM: PORTABLE CHEST 1 VIE

## 2021-04-14 NOTE — Evaluation (Signed)
Clinical/Bedside Swallow Evaluation ?Patient Details  ?Name: Mariah Henderson ?MRN: [id number] ?Date of Birth: [birth year] ? ?Today's Date: 04/14/2021 ?Time: SLP Start Time (ACUTE ONLY): [id number] SLP Stop Time (ACUTE ONLY): 0809 ?SLP Time Calculation (min) (ACUTE ONLY): 27 min ? ?Past Medical History:  ?Past Medical History:  ?Diagnosis Date  ? Acute medial meniscus tear   ? right knee  ? Anxiety   ? Asthma   ? Cervical spondylosis   ? Chronic serous otitis media   ? Constipation   ? COPD (chronic obstructive pulmonary disease) ([place])   ? Depression   ? followed by [name]; no longer of depakote, seroquel  ? Diabetes mellitus without complication ([place])   ? Dysuria 12/27/2009  ? Qualifier: Diagnosis of  By: [name] MD, [place]    ? Foot drop   ? GERD (gastroesophageal reflux disease)   ? Heart murmur   ? Hyperlipidemia   ? Hypertension   ? Low back pain   ? Obesity   ? pancreatic ca with liver mets 02/2020  ? Paraumbilical hernia   ? Pre-diabetes   ? ?Past Surgical History:  ?Past Surgical History:  ?Procedure Laterality Date  ? ABDOMINAL HYSTERECTOMY    ? ANTERIOR CERVICAL DECOMP/DISCECTOMY FUSION N/A 10/05/2014  ? Procedure: ACDF - C5-C6 - C6-C7;  Surgeon: Karie Chimera, MD;  Location: [facility];  Service: Neurosurgery;  Laterality: N/A;  ACDF - C5-C6 - C6-C7  ? CHOLECYSTECTOMY    ? COLONOSCOPY    ? FOOT SURGERY    ? HERNIA REPAIR  1999  ? IR IMAGING GUIDED PORT INSERTION  02/25/2020  ? IR US GUIDANCE  01/28/2020  ? KNEE ARTHROSCOPY WITH MEDIAL MENISECTOMY Right 06/14/2016  ? Procedure: RIGHT KNEE ARTHROSCOPY WITH PARTIAL MEDIAL MENISCECTOMY, SUBCHONDROPLASTY;  Surgeon: Leandrew Koyanagi, MD;  Location: [facility];  Service: Orthopedics;  Laterality: Right;  ? KNEE ARTHROSCOPY WITH SUBCHONDROPLASTY Right 06/14/2016  ? Procedure: KNEE ARTHROSCOPY WITH SUBCHONDROPLASTY;  Surgeon: Leandrew Koyanagi, MD;  Location: [facility];  Service: Orthopedics;  Laterality: Right;  ? RADIOACTIVE SEED GUIDED EXCISIONAL  BREAST BIOPSY Left 06/23/2014  ? Procedure: RADIOACTIVE SEED GUIDED EXCISIONAL BREAST BIOPSY;  Surgeon: Stark Klein, MD;  Location: [facility];  Service: General;  Laterality: Left;  ? ?HPI:  ?71 yo female adm to [facility] with AMS - ? 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   Brain imaging negative for acute event. Pt failed Yale and swallow evaluation was ordered.  ?  ?Assessment / Plan / Recommendation  ?Clinical Impression ? Patient presents with suspected oral cognitive based dysphagia and clinical indications of potential GERD c/b eructation after liquid swallows.  No focal CN defictis present.  She did not pass Yale 3 ounce water challenge due to requiring frequent rest break ceasing drinking from straw butno indication of aspiration. Pt with proprioception difficulties requiring hand over hand assist to self feed via cup, straw and spoon.  Prolonged oral transiting present due to decreased awareness requiring cues to consume liquid to clear.  Pt reports issues with sensing food "sticking in her throat" 0 pointing distal in pharynx. She admits she has to expel food orally at times.  SLP cannot rule out pharyngeal deficits from h/o ACDF C5-C6, C6-C7 - as she reports dysphagia commenced after surgery, however with eructation, suspect symptoms are related to potential esophageal deficits. Pt admits to becoming "full"  easily - suspect due to liver/pancreatic cancer. Recommend dys3/thin with assistance for self feeding with precautions.  Will follow up for dysphagia management.  Informed pt and RN of recommendations.  ? ? ?SLP Visit Diagnosis: Dysphagia, unspecified (R13.10) ?   ?Aspiration Risk ? Mild aspiration risk  ?  ?Diet Recommendation Dysphagia 3 (Mech soft);Thin liquid  ? ?Liquid Administration via: Cup;Straw ?Medication Administration: Whole meds with puree ?Supervision: Patient  able to self feed ?Compensations: Slow rate;Small sips/bites;Follow solids with liquid ?Postural Changes: Remain upright for at least 30 minutes after po intake;Seated upright at 90 degrees  ?  ?Other  Recommendations Oral Care Recommendations: Oral care BID   ? ?Recommendations for follow up therapy are one component of a multi-disciplinary discharge planning process, led by the attending physician.  Recommendations may be updated based on patient status, additional functional criteria and insurance authorization. ? ?Follow up Recommendations No SLP follow up  ? ? ?  ?Assistance Recommended at Discharge Frequent or constant Supervision/Assistance  ?Functional Status Assessment Patient has had a recent decline in their functional status and demonstrates the ability to make significant improvements in function in a reasonable and predictable amount of time.  ?Frequency and Duration min 1 x/week  ?1 week ?  ?   ? ?Prognosis Prognosis for Safe Diet Advancement: Fair ?Barriers to Reach Goals: Cognitive deficits  ? ?  ? ?Swallow Study   ?General Date of Onset: 04/14/21 ?HPI: 71 yo female adm to [facility] with AMS - ? 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   Brain imaging negative for acute event. Pt failed Yale and swallow evaluation was ordered. ?Type of Study: Bedside Swallow Evaluation ?Diet Prior to this Study: NPO ?Temperature Spikes Noted: No ?Respiratory Status: Nasal cannula ?History of Recent Intubation: No ?Behavior/Cognition: Alert;Cooperative;Pleasant mood ?Oral Cavity Assessment: Within Functional Limits ?Oral Care Completed by SLP: No ?Oral Cavity - Dentition: Other (Comment) (no upper dentition, few lower anterior dentition) ?Vision: Functional for self-feeding (proprioception deficits resulting in pt having difficulty reaching cup/straw) ?Self-Feeding Abilities: Able to feed self ?Patient  Positioning: Upright in bed ?Baseline Vocal Quality: Normal ?Volitional Cough: Strong ?Volitional Swallow: Able to elicit  ?  ?Oral/Motor/Sensory Function Overall Oral Motor/Sensory Function: Generalized oral weakness   ?Ice Chips Ice chips: Within functional limits ?Presentation: Spoon   ?Thin Liquid Thin Liquid: Within functional limits ?Presentation: Cup;Self Fed;Spoon;Straw ?Other Comments: minimal delay in last swallow of sequential swallows  ?  ?Nectar Thick Nectar Thick Liquid: Not tested   ?Honey Thick Honey Thick Liquid: Not tested   ?Puree Puree: Impaired ?Presentation: Spoon;Self Fed (with assist) ?Oral Phase Functional Implications: Prolonged oral transit;Oral residue   ?Solid ? ? ?  Solid: Impaired ?Presentation: Self Fed ?Oral Phase Impairments: Reduced lingual movement/coordination;Impaired mastication ?Oral Phase Functional Implications: Prolonged oral transit;Oral residue;Impaired mastication  ? ?  ? ?Macario Golds ?04/14/2021,8:42 AM ?Kathleen Lime, MS [facility] SLP ?Acute Rehab Services ?Office [phone] ?Pager [phone] ? ? ? ?

## 2021-04-14 NOTE — NC FL2 (Signed)
?[place] MEDICAID FL2 LEVEL OF CARE SCREENING TOOL  ?  ? ?IDENTIFICATION  ?Patient Name: ?Cordia Miklos Birthdate: 11-Aug-1950 Sex: female Admission Date (Current Location): ?04/13/2021  ?[place] and [place] Number: ? [place] ?  Facility and Address:  ?[facility], [place] [address], [place], [place] [address] ?     Provider Number: ?[id number]  ?Attending Physician Name and Address:  ?Lucious Groves, DO ? Relative Name and Phone Number:  ?  ?   ?Current Level of Care: ?Hospital Recommended Level of Care: ?[place] Prior Approval Number: ?  ? ?Date Approved/Denied: ?  PASRR Number: ?[id number] A ? ?Discharge Plan: ?SNF ?  ? ?Current Diagnoses: ?Patient Active Problem List  ? Diagnosis Date Noted  ? Pressure injury of skin 04/14/2021  ? Metabolic encephalopathy 05/39/7673  ? Finger laceration 12/28/2020  ? Chemotherapy-induced neuropathy ([place]) 10/12/2020  ? Elevated temperature 04/08/2020  ? Mouth sore 03/31/2020  ? Genetic testing 03/08/2020  ? Port-A-Cath in place 02/26/2020  ? Goals of care, counseling/discussion 02/11/2020  ? Pancreatic cancer ([place]) 01/21/2020  ? Decreased appetite 11/14/2019  ? 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 Dysphagia 12/12/2017  ? Arthritis of right sternoclavicular joint 11/08/2017  ? Full thickness rotator cuff tear 11/08/2017  ? Cervical stenosis of spine 09/26/2017  ? Disorder of right rotator cuff 09/26/2017  ? Vertigo 09/02/2017  ? Other meniscus derangements, posterior horn of medial meniscus, right knee 06/06/2016  ? GERD (gastroesophageal reflux disease) 08/05/2015  ? Cervical spondylosis with radiculopathy 10/05/2014  ? Fibrocystic changes of left breast 07/02/2014  ? Osteopenia 04/01/2014  ? Shoulder pain, right 10/21/2012  ? Lower extremity edema 06/17/2012  ? Seasonal allergies 04/29/2012  ? Reactive airway disease with wheezing 04/15/2012  ? Preventative health care 03/04/2012  ?  Glaucoma 05/26/2011  ? Osteoarthritis 06/30/2008  ? Allergic rhinitis 05/01/2008  ? Class 2 obesity with body mass index (BMI) of 35 to 39.9 without comorbidity 12/06/2005  ? Depression, major, 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[place]) 12/06/2005  ? Essential hypertension 12/06/2005  ? Back pain 12/06/2005  ? ? ?Orientation RESPIRATION BLADDER Height & Weight   ?  ?Self, Place ? Normal Continent Weight: 113 lb 12.1 oz (51.6 kg) ?Height:  5' (152.4 cm)  ?BEHAVIORAL SYMPTOMS/MOOD NEUROLOGICAL BOWEL NUTRITION STATUS  ?    Continent Diet (see DC summary)  ?AMBULATORY STATUS COMMUNICATION OF NEEDS Skin   ?Limited Assist Verbally PU Stage and Appropriate Care ?PU Stage 1 Dressing: Daily (unstageable left hip, foam dressing) ?  ?  ?    ?     ?     ? ? ?Personal Care Assistance Level of Assistance  ?Bathing, Feeding, Dressing Bathing Assistance: Limited assistance ?Feeding assistance: Independent ?Dressing Assistance: Limited assistance ?   ? ?Functional Limitations Info  ?Speech   ?  ?Speech Info: Impaired (dysarthria)  ? ? ?SPECIAL CARE FACTORS FREQUENCY  ?PT (By licensed PT), OT (By licensed OT)   ?  ?PT Frequency: 5x/wk ?OT Frequency: 5x/wk ?  ?  ?  ?   ? ? ?Contractures Contractures Info: Not present  ? ? ?Additional Factors Info  ?Code Status, Allergies Code Status Info: DNR ?Allergies Info: NKA ?  ?  ?  ?   ? ?Current Medications (04/14/2021):  This is the current hospital active medication list ?Current Facility-Administered Medications  ?Medication Dose Route Frequency Provider Last Rate Last Admin  ? acetaminophen (TYLENOL) tablet 650 mg  650 mg Oral Q6H PRN  Dorethea Clan, DO      ? Or  ? acetaminophen (TYLENOL) suppository 650 mg  650 mg Rectal Q6H PRN [place], Rayann N, DO      ? enoxaparin (LOVENOX) injection 40 mg  40 mg Subcutaneous Q24H [place], Rayann N, DO   40 mg at 04/13/21 2124  ? leptospermum manuka honey (MEDIHONEY) paste 1 application.  1 application. Topical Daily Joni Reining C, DO      ? polyethylene glycol (MIRALAX /  GLYCOLAX) packet 17 g  17 g Oral Daily [name] A, NP   17 g at 04/14/21 1356  ? senna-docusate (Senokot-S) tablet 1 tablet  1 tablet Oral QHS PRN Carlis Stable, NP      ? ? ? ?Discharge Medications: ?Please see discharge summary for a list of discharge medications. ? ?Relevant Imaging Results: ? ?Relevant Lab Results: ? ? ?Additional Information ?SS#: [id number] ? ?Geralynn Ochs, LCSW ? ? ? ? ?

## 2021-04-14 NOTE — Progress Notes (Addendum)
HEMATOLOGY-ONCOLOGY PROGRESS NOTE ? ?ASSESSMENT AND PLAN: ?1.  Metastatic pancreatic cancer ?2.  Leukocytosis ?3.  Normocytic anemia ?4.  Mild transaminitis and hyperbilirubinemia ?5.  Acute metabolic encephalopathy, improved ?6.  Hypertension ?7.  Type 2 diabetes mellitus ? ?-The patient appears to be back to baseline.  We will hold olaparib for right now and if she continues to improve, will resume at a lower dose. ?-The patient currently lives alone.  Has family to check on her but not there all the time.  Has been evaluated by PT who recommends short-term rehab.  Discussed this with the patient and agree with this recommendation.  She is agreeable to short-term rehab with plans to go home thereafter. ?-She currently has an outpatient follow-up appointment scheduled in our office on Monday 4/3.  For now, we will keep this appointment but may need to reschedule depending on whether she is discharged from the hospital and where she goes upon discharge. ? ?Mikey Bussing, DNP, AGPCNP-BC, AOCNP ? ?SUBJECTIVE: Ms. Pajak is followed by our office for metastatic pancreatic cancer.  She has been receiving systemic treatment with liposomal Irinotecan.  Treatment was held at her last visit on 3/22 secondary to hyperbilirubinemia.  Started olaparib once the evening prior to admission (3/28).  Took 2 doses of the olaparib. Presented to the emergency department with altered mental status.  She had right-sided weakness on admission and code stroke was called.  CT of the head showed a possible acute infarct in the right pons.  MRI brain obtained which showed no acute intracranial abnormality, generalized age-related cerebral atrophy with moderate chronic small vessel ischemic disease, 1.4 cm T2 hypointense lesion involving the deep/posterior aspect of the right parotid gland which is indeterminate.  Has been seen by neurology.  EEG obtained which showed moderate diffuse encephalopathy.  CTA head and neck was negative for large  vessel occlusion.  There were multiple scattered pulmonary nodules in the lungs consistent with her known metastatic disease.  The 1.4 cm soft tissue mass in the posterior aspect of the right parotid gland was again noted and the ENT consultation is recommended. ? ?This afternoon, patient reports that she is feeling well.  She has some back pain earlier which has now resolved.  She reports some mild abdominal discomfort over her right upper quadrant.  She states that she took her olaparib and felt that she tolerated it well without any nausea or vomiting.  Cannot recall the events which led to her hospitalization. ? ?Oncology History Overview Note  ?Cancer Staging ?Pancreatic cancer ([place]) ?Staging form: Exocrine Pancreas, AJCC 8th Edition ?- Clinical: Stage IV (cT1c, cN0, pM1) - Signed by Truitt Merle, MD on 02/26/2020 ?Total positive nodes: 0 ? ?  ?Pancreatic cancer ([place])  ?01/13/2020 Imaging  ? US Abdomen 01/13/20  ?IMPRESSION: ?1. Suggestion of 3 vascular hypoechoic mass within the liver ?measuring up to 2.5 cm. ?2. Suggestion of a hypoechoic mass within the tail of the pancreas ?that is not well visualized. ?3. Findings concerning for malignancy, please see separately ?dictated CT abdomen pelvis 01/13/2020. ?  ?01/13/2020 Imaging  ? CT AP 01/13/20  ?IMPRESSION: ?1. Hypoenhancing 2.0 cm in long axis mass in the pancreatic tail ?compatible with pancreatic adenocarcinoma. The mass abuts the ?posterior gastric wall but without definite gastric invasion. The ?splenic vein appears narrowed/attenuated compared to prior exams and ?the lesion abuts the margin of the splenic artery. ?2. Hypoenhancing hepatic masses are new compared to 11/22/2017 and ?are highly suspicious for metastatic lesions. ?3. Questionable 0.8  by 0.8 cm omental tumor nodule versus dense ?diverticulum above the transverse colon. ?4. Other imaging findings of potential clinical significance: Mild ?distal esophageal wall thickening, query esophagitis.  Stable left ?adrenal adenoma. Sigmoid colon diverticulosis. Lumbar and thoracic ?spondylosis and degenerative disc disease contributing to multilevel ?impingement. ?5. Aortic atherosclerosis. ?Aortic Atherosclerosis (ICD10-I70.0). ?  ?01/21/2020 Initial Diagnosis  ? Pancreatic cancer [facility]) ?  ?01/28/2020 Initial Biopsy  ? FINAL MICROSCOPIC DIAGNOSIS:  ? ?A. LIVER, NEEDLE CORE BIOPSY:  ?- Adenocarcinoma.  ? ?COMMENT:  ? ?Immunohistochemistry for CK7 is positive.  CDX-2 demonstrates weak to  ?moderate positive staining.  TTF-1 and PAX 8 demonstrate weak, likely  ?nonspecific staining.  CK20, GATA-3 and ER are negative.  The  ?morphologic and immunophenotypic characteristics are compatible with the  ?clinical impression of a primary pancreatic cancer.  Radiologic  ?correlation is encouraged.  If applicable, there is likely sufficient  ?tissue for ancillary studies (Block A2).  Dr. Vic Ripper reviewed the  ?case.  ?  ?02/26/2020 - 05/19/2020 Chemotherapy  ? First-line Gemcitabine and Abraxane every 2 weeks starting 02/26/20. D/c after 05/19/20 due to disease progression in liver and neuropathy  ? ?  ?02/26/2020 Cancer Staging  ? Staging form: Exocrine Pancreas, AJCC 8th Edition ?- Clinical: Stage IV (cT1c, cN0, pM1) - Signed by Truitt Merle, MD on 02/26/2020 ?Total positive nodes: 0 ? ?  ?03/08/2020 Genetic Testing  ? Negative hereditary cancer genetic testing: no pathogenic variants detected in [facility] Common Hereditary Cancers Panel with preliminary evidence pancreatic cancer genes.  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 The report date is March 08, 2020.   ? ?The Common Hereditary Cancers Panel with preliminary evidence pancreatic cancer genes offered by [facility] includes sequencing and/or deletion duplication testing of the following 49 genes: APC, ATM, AXIN2, BARD1, BMPR1A, BRCA1, BRCA2, BRIP1, CDH1, CDK4, CDKN2A (p14ARF), CDKN2A (p16INK4a), CHEK2, CTNNA1, DICER1, EPCAM  (Deletion/duplication testing only), GREM1 (promoter region deletion/duplication testing only), FANCC, GREM1, HOXB13, KIT, MEN1, MLH1, MSH2, MSH3, MSH6, MUTYH, NBN, NF1, NHTL1, PALB2, PALLD, PDGFRA, PMS2, POLD1, POLE, PTEN, RAD50, RAD51C, RAD51D, SDHA, SDHB, SDHC, SDHD, SMAD4, SMARCA4. STK11, TP53, TSC1, TSC2, and VHL.  The following genes were evaluated for sequence changes only: SDHA and HOXB13 c.251G>A variant only.es only: SDHA and HOXB13 c.251G>A variant only. ?  ?05/03/2020 Imaging  ? CT AP  ?IMPRESSION: ?1. Interval decrease in size of a hypodense mass of the central ?pancreatic tail, consistent with treatment response of primary ?pancreatic malignancy. ?2. Numerous low-attenuation liver lesions are seen, increased in ?size and number compared to prior examination. Findings are ?consistent with worsened hepatic metastatic disease. ?3. A previously queried omental nodule adjacent to the transverse ?colon is more clearly a prominent diverticulum on current ?examination. ?  ?Aortic Atherosclerosis (ICD10-I70.0). ?  ?06/02/2020 -  Chemotherapy  ? Second-line FOLFIRI q2weeks starting 06/02/20 ? ?  ?12/15/2020 - 02/18/2021 Chemotherapy  ? Patient is on Treatment Plan : PANCREAS FOLFOX q14d  ?   ?02/28/2021 Imaging  ? CT CAP IMPRESSION: ?1. Progressive metastatic disease, with substantial enlargement of the multilobular dominant hepatic metastatic lesion, as well as scattered pulmonary nodules which are increasing in size. ?2. Other imaging findings of potential clinical significance: Aortic Atherosclerosis (ICD10-I70.0). Coronary atherosclerosis. Peripheral atherosclerosis. Fatty prominence of the interatrial septum. Possible esophagitis. Emphysema (ICD10-J43.9). Left adrenal adenoma. Atrophic pancreatic tail. Sigmoid colon diverticulosis. Mild multilevel lumbar impingement. No discrete splenic mass today. ?  ?03/09/2021 -  Chemotherapy  ? Patient is on Treatment Plan : PANCREAS Liposomal Irinotecan /  Leucovorin / 5-FU   q14d  ?   ? ? ? ?REVIEW OF SYSTEMS:   ?Review of Systems  ?Constitutional:  Positive for malaise/fatigue. Negative for chills and fever.  ?HENT: Negative.    ?Respiratory: Negative.    ?Cardiovascular: Negative.   ?[employer]

## 2021-04-14 NOTE — Plan of Care (Signed)
?  Problem: Education: ?Goal: Knowledge of disease or condition will improve ?Outcome: Progressing ?Goal: Knowledge of secondary prevention will improve (SELECT ALL) ?Outcome: Progressing ?Goal: Knowledge of patient specific risk factors will improve (INDIVIDUALIZE FOR PATIENT) ?Outcome: Progressing ?  ?Problem: Coping: ?Goal: Will verbalize positive feelings about self ?Outcome: Progressing ?Goal: Will identify appropriate support needs ?Outcome: Progressing ?  ?Problem: Health Behavior/Discharge Planning: ?Goal: Ability to manage health-related needs will improve ?Outcome: Progressing ?  ?Problem: Self-Care: ?Goal: Ability to participate in self-care as condition permits will improve ?Outcome: Progressing ?Goal: Verbalization of feelings and concerns over difficulty with self-care will improve ?Outcome: Progressing ?Goal: Ability to communicate needs accurately will improve ?Outcome: Progressing ?  ?Problem: Nutrition: ?Goal: Risk of aspiration will decrease ?Outcome: Progressing ?  ?Problem: Intracerebral Hemorrhage Tissue Perfusion: ?Goal: Complications of Intracerebral Hemorrhage will be minimized ?Outcome: Progressing ?  ?Problem: Ischemic Stroke/TIA Tissue Perfusion: ?Goal: Complications of ischemic stroke/TIA will be minimized ?Outcome: Progressing ?  ?

## 2021-04-14 NOTE — Plan of Care (Signed)
?  Problem: Education: ?Goal: Knowledge of disease or condition will improve ?Outcome: Progressing ?Goal: Knowledge of secondary prevention will improve (SELECT ALL) ?Outcome: Progressing ?Goal: Knowledge of patient specific risk factors will improve (INDIVIDUALIZE FOR PATIENT) ?Outcome: Progressing ?  ?Problem: Coping: ?Goal: Will verbalize positive feelings about self ?Outcome: Progressing ?Goal: Will identify appropriate support needs ?Outcome: Progressing ?  ?Problem: Health Behavior/Discharge Planning: ?Goal: Ability to manage health-related needs will improve ?Outcome: Progressing ?  ?Problem: Self-Care: ?Goal: Ability to participate in self-care as condition permits will improve ?Outcome: Progressing ?Goal: Verbalization of feelings and concerns over difficulty with self-care will improve ?Outcome: Progressing ?Goal: Ability to communicate needs accurately will improve ?Outcome: Progressing ?  ?Problem: Nutrition: ?Goal: Risk of aspiration will decrease ?Outcome: Progressing ?  ?Problem: Intracerebral Hemorrhage Tissue Perfusion: ?Goal: Complications of Intracerebral Hemorrhage will be minimized ?Outcome: Progressing ?  ?Problem: Ischemic Stroke/TIA Tissue Perfusion: ?Goal: Complications of ischemic stroke/TIA will be minimized ?Outcome: Progressing ?  ?Problem: Education: ?Goal: Knowledge of General Education information will improve ?Description: Including pain rating scale, medication(s)/side effects and non-pharmacologic comfort measures ?Outcome: Progressing ?  ?Problem: Health Behavior/Discharge Planning: ?Goal: Ability to manage health-related needs will improve ?Outcome: Progressing ?  ?Problem: Clinical Measurements: ?Goal: Ability to maintain clinical measurements within normal limits will improve ?Outcome: Progressing ?Goal: Will remain free from infection ?Outcome: Progressing ?Goal: Diagnostic test results will improve ?Outcome: Progressing ?Goal: Respiratory complications will improve ?Outcome:  Progressing ?Goal: Cardiovascular complication will be avoided ?Outcome: Progressing ?  ?Problem: Activity: ?Goal: Risk for activity intolerance will decrease ?Outcome: Progressing ?  ?Problem: Coping: ?Goal: Level of anxiety will decrease ?Outcome: Progressing ?  ?Problem: Elimination: ?Goal: Will not experience complications related to bowel motility ?Outcome: Progressing ?Goal: Will not experience complications related to urinary retention ?Outcome: Progressing ?  ?Problem: Pain Managment: ?Goal: General experience of comfort will improve ?Outcome: Progressing ?  ?Problem: Safety: ?Goal: Ability to remain free from injury will improve ?Outcome: Progressing ?  ?

## 2021-04-14 NOTE — Consult Note (Signed)
WOC Nurse Consult Note: ?Reason for Consult: Consult requested for pressure injury.   ?Wound type: Left hip with Unstageable pressure injury; 1.5X1.5cm, 90% black moist eschar, 10% red, small amt tan drainage. ?Buttocks and sacrum with intact skin.  Partial thickness fissure to inner gluteal fold; 2X.1X.1cm, red and moist.  Appearance and location are consistent with moisture associated skin damage, not pressure.  ?ICD-10 CM Codes for Irritant Dermatitis ?[id number] - Due to fecal, urinary or dual incontinence ?N47S9 - Due to friction or contact with other specified body fluids ? ?Pressure Injury POA: Yes ?Dressing procedure/placement/frequency: Topical treatment orders provided for bedside nurses to perform as follows to assist with enzymatic debridement of nonviable tissue: Apply a thin later of Medihoney to left hip wound Q day, then cover with foam dressing. (Change foam dressing Q 3 days or PRN soiling.) ?Please re-consult if further assistance is needed.  Thank-you,  ?Julien Girt MSN, RN, [place], [place], CNS ?[phone]  ?  ?

## 2021-04-14 NOTE — Consult Note (Signed)
? ?Palliative Care Consult Note  ?                                ?Date: 04/14/2021  ? ?Patient Name: Mariah Henderson  ?DOB: 09-12-1950  MRN: [id number]  Age / Sex: 71 y.o., female  ?PCP: Mariah Falcon, MD ?Referring Physician: Lucious Groves, DO ? ?Reason for Consultation: Establishing goals of care ? ?HPI/Patient Profile: 71 y.o. 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 Family felt patient's conversation was becoming abnormal and sister went to check on her, found unresponsive in the bed. Brought to the ER as Code Stroke. Recently started new medication (5th line treatment for pancreatic CA), noted confused. Brain imaging less suspicious for CVA. 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  ? ?PMT consulted for Mariah Henderson discussions. Of note, has recently seen Mariah Lea, NP in the [place] clinic on 04/06/21. Made adjustments to pain regimen for increasing pain likely due to liver tumor burden, medication adjustments for constipation, began discussions on [place] but no official decisions made that day ? ?Past Medical History:  ?Diagnosis Date  ? Acute medial meniscus tear   ? right knee  ? Anxiety   ? Asthma   ? Cervical spondylosis   ? Chronic serous otitis media   ? Constipation   ? COPD (chronic obstructive pulmonary disease) ([place])   ? Depression   ? followed by [name]; no longer of depakote, seroquel  ? Diabetes mellitus without complication ([place])   ? Dysuria 12/27/2009  ? Qualifier: Diagnosis of  By: [name] MD, [place]    ? Foot drop   ? GERD (gastroesophageal reflux disease)   ? Heart murmur   ? Hyperlipidemia   ? Hypertension   ? Low back pain   ? Obesity   ? pancreatic ca with liver mets  02/2020  ? Paraumbilical hernia   ? Pre-diabetes   ? ? ?Subjective:  ? ?This NP Mariah Henderson reviewed medical records, received report from team, assessed the patient and then meet at the patient's bedside to discuss diagnosis, prognosis, GOC, EOL wishes disposition and options. ? ?I met with the patient at the bedside. I spoke with the patient's son Mariah Henderson on the phone. ?  ?Concept of Palliative Care was introduced as specialized medical care for people and their families living with serious illness.  If focuses on providing relief from the symptoms and stress of a serious illness.  The goal is to improve quality of life for both the patient and the family. Values and goals of care important to patient and family were attempted to be elicited. ? ?Created space and opportunity for patient  and family to explore thoughts and feelings regarding current medical situation ?  ?Natural trajectory and current clinical status were discussed. Questions and concerns addressed. Patient  encouraged to call with questions or concerns.   ? ?Patient/Family Understanding of Illness: ?The patient is quite aware of her current clinical situation.  She states that she has cancer in her pancreas and liver.  She confirmed that there is also cancer in her lungs and noted that "the cancer in the lungs is gotten worse so they changed my medicines."  ? ?Palliative Care Consult Note  ?                                ?Date: 04/14/2021  ? ?Patient Name: Mariah Henderson  ?DOB: [birth year]  MRN: [id number]  Age / Sex: 71 y.o., female  ?PCP: Mariah Falcon, MD ?Referring Physician: Lucious Groves, DO ? ?Reason for Consultation: Establishing goals of care ? ?HPI/Patient Profile: 71 y.o. 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 Family felt patient's conversation was becoming abnormal and sister went to check on her, found unresponsive in the bed. Brought to the ER as Code Stroke. Recently started new medication (5th line treatment for pancreatic CA), noted confused. Brain imaging less suspicious for CVA. 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  ? ?PMT consulted for [place] discussions. Of note, has recently seen Mariah Lea, NP in the [place] clinic on 04/06/21. Made adjustments to pain regimen for increasing pain likely due to liver tumor burden, medication adjustments for constipation, began discussions on [place] but no official decisions made that day ? ?Past Medical History:  ?Diagnosis Date  ? Acute medial meniscus tear   ? right knee  ? Anxiety   ? Asthma   ? Cervical spondylosis   ? Chronic serous otitis media   ? Constipation   ? COPD (chronic obstructive pulmonary disease) ([place])   ? Depression   ? followed by [name]; no longer of depakote, seroquel  ? Diabetes mellitus without complication ([place])   ? Dysuria 12/27/2009  ? Qualifier: Diagnosis of  By: [name] MD, [place]    ? Foot drop   ? GERD (gastroesophageal reflux disease)   ? Heart murmur   ? Hyperlipidemia   ? Hypertension   ? Low back pain   ? Obesity   ? pancreatic ca with liver mets  02/2020  ? Paraumbilical hernia   ? Pre-diabetes   ? ? ?Subjective:  ? ?This NP Mariah Henderson reviewed medical records, received report from team, assessed the patient and then meet at the patient's bedside to discuss diagnosis, prognosis, GOC, EOL wishes disposition and options. ? ?I met with the patient at the bedside. I spoke with the patient's son Mariah Henderson on the phone. ?  ?Concept of Palliative Care was introduced as specialized medical care for people and their families living with serious illness.  If focuses on providing relief from the symptoms and stress of a serious illness.  The goal is to improve quality of life for both the patient and the family. Values and goals of care important to patient and family were attempted to be elicited. ? ?Created space and opportunity for patient  and family to explore thoughts and feelings regarding current medical situation ?  ?Natural trajectory and current clinical status were discussed. Questions and concerns addressed. Patient  encouraged to call with questions or concerns.   ? ?Patient/Family Understanding of Illness: ?The patient is quite aware of her current clinical situation.  She states that she has cancer in her pancreas and liver.  She confirmed that there is also cancer in her lungs and noted that "the cancer in the lungs is gotten worse so they changed my medicines."  ? ?Palliative Care Consult Note  ?                                ?Date: 04/14/2021  ? ?Patient Name: Mariah Henderson  ?DOB: 03/03/1950  MRN: [id number]  Age / Sex: 71 y.o., female  ?PCP: Mullen, Emily B, MD ?Referring Physician: Hoffman, Erik C, DO ? ?Reason for Consultation: Establishing goals of care ? ?HPI/Patient Profile: 71 y.o. 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 Family felt patient's conversation was becoming abnormal and sister went to check on her, found unresponsive in the bed. Brought to the ER as Code Stroke. Recently started new medication (5th line treatment for pancreatic CA), noted confused. Brain imaging less suspicious for CVA. She was admitted on 04/13/2021 with Metabolic encephalopathy, Stage IV pancreatic CA (mets to liver and lungs, progressive), and others.  ? ?PMT consulted for GOC discussions. Of note, [facility] Nik[facility] Pickenpack-Cousar[facility]. Made adjustments to pain regimen for increasing pain likely due to liver tumor burden, medication adjustments for constipation, began discussions on GOC but no official decisions made that day ? ?Past Medical History:  ?Diagnosis Date  ? Acute medial meniscus tear   ? right knee  ? Anxiety   ? Asthma   ? Cervical spondylosis   ? Chronic serous otitis media   ? Constipation   ? COPD (chronic obstructive pulmonary disease) ([facility])   ? Depression   ? followed by Dr. Sanders; no longer of depakote, seroquel  ? Diabetes mellitus without complication ([facility])   ? Dysuria 12/27/2009  ? Qualifier: Diagnosis of  By: Sidhu MD, [place]    ? Foot drop   ? GERD (gastroesophageal reflux disease)   ? Heart murmur   ? Hyperlipidemia   ? Hypertension   ? Low back pain   ? Obesity   ? pancreatic ca with liver mets  02/2020  ? Paraumbilical hernia   ? Pre-diabetes   ? ? ?Subjective:  ? ?This NP   reviewed medical records, received report from team, assessed the patient and then meet at the patient's bedside to discuss diagnosis, prognosis, GOC, EOL wishes disposition and options. ? ?I met with the patient at the bedside. I spoke with the patient's son Norman Saintvil on the phone. ?  ?Concept of Palliative Care was introduced as specialized medical care for people and their families living with serious illness.  If focuses on providing relief from the symptoms and stress of a serious illness.  The goal is to improve quality of life for both the patient and the family. Values and goals of care important to patient and family were attempted to be elicited. ? ?Created space and opportunity for patient  and family to explore thoughts and feelings regarding current medical situation ?  ?Natural trajectory and current clinical status were discussed. Questions and concerns addressed. Patient  encouraged to call with questions or concerns.   ? ?Patient/Family Understanding of Illness: ?The patient is quite aware of her current clinical situation.  She states that she has cancer in her pancreas and liver.  She confirmed that there is also cancer in her lungs and noted that "the cancer in the lungs is gotten worse so they changed my medicines."

## 2021-04-14 NOTE — Care Management Obs Status (Signed)
MEDICARE OBSERVATION STATUS NOTIFICATION ? ? ?Patient Details  ?Name: Mariah Henderson ?MRN: [id number] ?Date of Birth: [birth year] ? ? ?Medicare Observation Status Notification Given:  Yes ? ? ? ?Pollie Friar, RN ?04/14/2021, 3:20 PM ?

## 2021-04-14 NOTE — Evaluation (Signed)
Occupational Therapy Evaluation Patient Details Name: Linell Parro MRN: [id number] DOB: 04/09/1950 Today's Date: 04/14/2021   History of Present Illness Pt. is a 71 y.o. F. presenting to [facility] on 3/29 after being found unresponsive at home. Only change in her routine was taking a new cancer medication Garey Ham) and not taking her morning morphine. CT shows possible R pons infarct and MRI shows no acute infarct, both show age related cerebral atrophy. CXR shows Mixed interstitial and airspace opacities bilaterally, likely a  combination pulmonary edema and underlying metastatic disease. Pt also has an unstagable pressure wound on her L hip. 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   Clinical Impression   Pt is currently min assist level for selfcare tasks sit to stand and for functional transfers with use of the RW for support.  Increased confusion with decreased awareness of reason for hospitalization, but follows one step and multi step commands with increased time.  Feel she will benefit from acute care OT with follow-up short term SNF for continued progression as she will not have 24 hr supervision at discharge.        Recommendations for follow up therapy are one component of a multi-disciplinary discharge planning process, led by the attending physician.  Recommendations may be updated based on patient status, additional functional criteria and insurance authorization.   Follow Up Recommendations  Skilled nursing-short term rehab (<3 hours/day)    Assistance Recommended at Discharge Frequent or constant Supervision/Assistance  Patient can return home with the following A little help with walking and/or transfers;A little help with bathing/dressing/bathroom;Assistance with cooking/housework;Direct supervision/assist for financial management;Assist for transportation;Help with stairs or ramp for entrance    Functional  Status Assessment  Patient has had a recent decline in their functional status and demonstrates the ability to make significant improvements in function in a reasonable and predictable amount of time.  Equipment Recommendations  Other (comment) (TBD next venue of care)    Recommendations for Other Services       Precautions / Restrictions Precautions Precautions: Fall Precaution Comments: confusion Restrictions Weight Bearing Restrictions: No      Mobility Bed Mobility Overal bed mobility: Needs Assistance Bed Mobility: Supine to Sit, Sit to Supine     Supine to sit: Min assist Sit to supine: Min guard   General bed mobility comments: Pt needed step by step cueing for rolling onto her side and then transitioning to sitting.    Transfers Overall transfer level: Needs assistance Equipment used: Rolling walker (2 wheels) Transfers: Sit to/from Stand Sit to Stand: Min assist                  Balance Overall balance assessment: Needs assistance Sitting-balance support: No upper extremity supported, Feet unsupported Sitting balance-Leahy Scale: Fair     Standing balance support: Bilateral upper extremity supported, During functional activity Standing balance-Leahy Scale: Fair                             ADL either performed or assessed with clinical judgement   ADL Overall ADL's : Needs assistance/impaired                 Upper Body Dressing : Minimal assistance;Sitting Upper Body Dressing Details (indicate cue type and reason): donn robe Lower Body Dressing: Minimal assistance;Sit to/from stand   Toilet Transfer: Minimal assistance;Ambulation Toilet Transfer Details (indicate cue type and  reason): No device, simulated.  She was able to complete with min guard when using the RW for support. Toileting- Clothing Manipulation and Hygiene: Minimal assistance       Functional mobility during ADLs: Minimal assistance General ADL Comments: Pt's  family present for session with pt needing min assist for supine to sit with min guard for scooting out to the edge of the bed.  She needed min assist for ambulation to the sink and back with and without the RW.  Mod instructional cueing for hand placement on both occasions with sit to stand.  Pt on O2 throughout but unable to obtain pulse ox reading.  HR remained in the low 90's with activity.     Vision Baseline Vision/History: 1 Wears glasses (did not have them present) Ability to See in Adequate Light: 0 Adequate Patient Visual Report: No change from baseline Vision Assessment?: No apparent visual deficits     Perception Perception Perception: Within Functional Limits   Praxis Praxis Praxis: Intact    Pertinent Vitals/Pain Pain Assessment Pain Assessment: Faces Faces Pain Scale: Hurts a little bit Pain Location: R hip Pain Intervention(s): Limited activity within patient's tolerance, Repositioned     Hand Dominance Right   Extremity/Trunk Assessment Upper Extremity Assessment Upper Extremity Assessment: Generalized weakness (shoulder strength 3+/5 bilaterally with elbow and grip 4/5 bilaterally.)   Lower Extremity Assessment Lower Extremity Assessment: Defer to PT evaluation   Cervical / Trunk Assessment Cervical / Trunk Assessment: Kyphotic   Communication Communication Communication: HOH   Cognition Arousal/Alertness: Awake/alert Behavior During Therapy: WFL for tasks assessed/performed Overall Cognitive Status: Impaired/Different from baseline Area of Impairment: Orientation, Memory, Following commands, Safety/judgement, Problem solving                 Orientation Level: Disoriented to, Situation   Memory: Decreased short-term memory Following Commands: Follows one step commands with increased time Safety/Judgement: Decreased awareness of safety, Decreased awareness of deficits     General Comments: Pt was able to state location, [facility] but  he was not able to state reason for hospitalization.  She reported initially she was here for cancer but then stated she didn't have that anymore, which unfortunately is not true.     General Comments  L hip wound covered with mepilex            Home Living Family/patient expects to be discharged to:: Private residence Living Arrangements: Alone Available Help at Discharge: Family;Available PRN/intermittently (sisters, bother close and regularly on check on her) Type of Home: Apartment Home Access: Level entry     Home Layout: One level     Bathroom Shower/Tub: Chief Strategy Officer: Standard Bathroom Accessibility: Yes   Home Equipment: Agricultural consultant (2 wheels);Hand held shower head;BSC/3in1;Grab bars - toilet;Grab bars - tub/shower;Cane - single point          Prior Functioning/Environment Prior Level of Function : Independent/Modified Independent             Mobility Comments: Uses cane and walker depending on how far she is going ADLs Comments: independent, drive, sisters can help, can make food but typically goes out to buy it.        OT Problem List: Decreased strength;Decreased activity tolerance;Impaired balance (sitting and/or standing);Pain;Decreased knowledge of use of DME or AE;Decreased cognition;Decreased safety awareness      OT Treatment/Interventions: Self-care/ADL training;Therapeutic exercise;Balance training;Patient/family education;Therapeutic activities;DME and/or AE instruction    OT Goals(Current goals can be found in the care  plan section) Acute Rehab OT Goals Patient Stated Goal: Pt unable to state during session. OT Goal Formulation: With patient Time For Goal Achievement: 04/28/21 Potential to Achieve Goals: Good  OT Frequency: Min 2X/week       AM-PAC OT "6 Clicks" Daily Activity     Outcome Measure Help from another person eating meals?: None Help from another person taking care of personal grooming?: A  Little Help from another person toileting, which includes using toliet, bedpan, or urinal?: A Little Help from another person bathing (including washing, rinsing, drying)?: A Little Help from another person to put on and taking off regular upper body clothing?: A Little Help from another person to put on and taking off regular lower body clothing?: A Little 6 Click Score: 19   End of Session Equipment Utilized During Treatment: Gait belt;Oxygen  Activity Tolerance: Patient tolerated treatment well Patient left: in bed;with call bell/phone within reach;with nursing/sitter in room;with family/visitor present  OT Visit Diagnosis: Unsteadiness on feet (R26.81);Muscle weakness (generalized) (M62.81);Other symptoms and signs involving cognitive function;Pain Pain - Right/Left: Right Pain - part of body:  (abdomen)                Time: 4034-7425 OT Time Calculation (min): 27 min Charges:  OT Evaluation $OT Eval Moderate Complexity: 1 Mod  Bennie Scaff OTR/L 04/14/2021, 4:39 PM

## 2021-04-14 NOTE — Progress Notes (Addendum)
STROKE TEAM PROGRESS NOTE  ? ?INTERVAL HISTORY ?"3/29 her niece spoke to her at 31 AM and their conversation was normal, but toward the end of her conversations, her speech seemed "crazy". Mariah Henderson, who lives with her, called later that morning and reported that she was not making sense and was talking "out of her head". Her speech just didn't make sense, although the speech was clear.  Because of this, her sister, Mariah Henderson, went to check in and found her unresponsive in bed.  ?  ?Her sister reports that the only thing that changed in Mariah Henderson routine was a new cancer medication, Lonie Peak, that she started 3/28.  She has taken 2 doses. She took all of her usual medications this morning, except for morphine. She was diagnosed January 2022 with pancreatic cancer, with subsequent mets to liver and lung. Chemotherapy did not seem to be working for liver and lung nodules, so she switched to oral medication. She has right-sided abdominal pain due to liver mets. In addition, she has had loss of appetite and difficulty swallowing solid foods. She has lost 25-30 lbs in the past month." ? ?No family at bedside. Patient was awake, alert, pleasantly demented and unable to participate in evaluation. Patient has poor attention/concentration and perseverates on previous answers.  ?When asked to name animals on 4 legs she continued counting fingers. Cannot follow 2 step commands, only simple midline commands.  ?Clock drawing- all numbers in right upper quadrant- visual spacial impairment. Unable to follow directions to draw intersecting squares after the drawing the clock. Could not complete MOCA 3. ?WBS 16.3, urine clear ? ? ?Vitals:  ? 04/13/21 1715 04/13/21 1950 04/13/21 2307 04/14/21 0340  ?BP:  123/70 110/75 108/66  ?Pulse:  89 95 93  ?Resp:  14 18   ?Temp:  97.6 ?F (36.4 ?C) 97.6 ?F (36.4 ?C) 98 ?F (36.7 ?C)  ?TempSrc:  Oral Oral Oral  ?SpO2:  100% 100% 98%  ?Weight: 51.6 kg     ?Height: 5' (1.524 m)     ? ?CBC:  ?Recent Labs   ?Lab 04/13/21 ?1327 04/13/21 ?1332 04/14/21 ?0451  ?WBC 15.6*  --  16.3*  ?NEUTROABS 15.0*  --   --   ?HGB 11.6* 13.6 11.4*  ?HCT 39.4 40.0 38.8  ?MCV 80.7  --  80.8  ?PLT 203  --  155  ? ?Basic Metabolic Panel:  ?Recent Labs  ?Lab 04/13/21 ?1327 04/13/21 ?1332 04/13/21 ?1645 04/14/21 ?0451  ?NA 135 135  --  138  ?K 4.5 4.9  --  4.3  ?CL 97* 96*  --  98  ?CO2 30  --   --  33*  ?GLUCOSE 187* 190*  --  123*  ?BUN 14 18  --  12  ?CREATININE 0.70 0.60  --  0.61  ?CALCIUM 9.8  --   --  9.8  ?MG  --   --  1.8  --   ? ?Lipid Panel: No results for input(s): CHOL, TRIG, HDL, CHOLHDL, VLDL, LDLCALC in the last 168 hours. ?HgbA1c: No results for input(s): HGBA1C in the last 168 hours. ?Urine Drug Screen: No results for input(s): LABOPIA, COCAINSCRNUR, LABBENZ, AMPHETMU, THCU, LABBARB in the last 168 hours.  ?Alcohol Level No results for input(s): ETH in the last 168 hours. ? ?IMAGING past 24 hours ?CT ANGIO HEAD NECK W WO CM ? ?Result Date: 04/14/2021 ?CLINICAL DATA:  Follow-up examination for stroke. EXAM: CT ANGIOGRAPHY HEAD AND NECK TECHNIQUE: Multidetector CT imaging of the head and neck was  performed using the standard protocol during bolus administration of intravenous contrast. Multiplanar CT image reconstructions and MIPs were obtained to evaluate the vascular anatomy. Carotid stenosis measurements (when applicable) are obtained utilizing NASCET criteria, using the distal internal carotid diameter as the denominator. RADIATION DOSE REDUCTION: This exam was performed according to the departmental dose-optimization program which includes automated exposure control, adjustment of the mA and/or kV according to patient size and/or use of iterative reconstruction technique. CONTRAST:  58m OMNIPAQUE IOHEXOL 350 MG/ML SOLN COMPARISON:  CT and MRI from 04/13/2021. FINDINGS: CT HEAD FINDINGS Brain: Age-related cerebral atrophy with moderate chronic microvascular ischemic disease. No acute intracranial hemorrhage or large vessel  territory infarct. No mass lesion or midline shift. No hydrocephalus or extra-axial fluid collection. Vascular: No hyperdense vessel. Skull: Scalp soft tissues and calvarium within normal limits. Sinuses: Clear. Orbits: Globes orbital soft tissues demonstrate no acute finding. Review of the MIP images confirms the above findings CTA NECK FINDINGS Aortic arch: Visualized aortic arch normal caliber with normal branch pattern. Mild atheromatous change about the origin the great vessels without significant stenosis. Right carotid system: Right common and internal carotid arteries tortuous but widely patent without stenosis or dissection. Minimal plaque about the right carotid bulb without stenosis. Left carotid system: Left common and internal carotid arteries tortuous but patent without stenosis or dissection. Minimal plaque about the left carotid bulb without stenosis. Vertebral arteries: Both vertebral arteries arise from subclavian arteries. No proximal subclavian artery stenosis. Proximal left V1 and V2 segments not well seen due to streak artifact from cervical ACDF. Visualized portions of the vertebral arteries patent without stenosis or dissection. Skeleton: No discrete or worrisome osseous lesions. Prior ACDF at C5-6 and C6-7. Other neck: No other acute soft tissue abnormality within the neck. 1.4 cm soft tissue mass present at the posterior aspect of the right parotid gland (series 12, image 83), indeterminate. Upper chest: Advanced emphysema. Known metastatic disease with scattered pulmonary nodules noted, partially visualized. Layering right pleural effusion with associated atelectasis. Right-sided Port-A-Cath in place. Layering secretions noted within the subglottic trachea. Review of the MIP images confirms the above findings CTA HEAD FINDINGS Anterior circulation: Petrous segments patent bilaterally. Mild for age atheromatous change within the carotid siphons without significant stenosis. A1 segments,  anterior communicating artery complex, and anterior cerebral arteries patent without abnormality or stenosis. M1 segments widely patent. No proximal MCA branch occlusion. Distal MCA branches perfused and symmetric. Posterior circulation: Both V4 segments patent without stenosis. Left PICA patent. Right PICA not well seen. Basilar patent to its distal aspect without stenosis. Superior cerebellar and posterior cerebral arteries patent bilaterally without significant stenosis. Short-segment mild stenosis noted at the junction of the left P1/P2 segments. Venous sinuses: Not well assessed due to arterial timing the contrast bolus. Anatomic variants: None significant.  No aneurysm. Review of the MIP images confirms the above findings IMPRESSION: 1. Negative CTA for large vessel occlusion or other emergent finding. 2. Mild for age atheromatous disease. No hemodynamically significant or correctable stenosis. 3. Diffuse tortuosity of the major arterial vasculature of the head and neck, suggesting chronic underlying hypertension. 4. Multiple scattered pulmonary nodules within the visualized lungs, consistent with known metastatic disease. 5. Layering right pleural effusion with associated atelectasis, partially visualized. 6. 1.4 cm soft tissue mass at the posterior aspect of the right parotid gland, indeterminate. Outpatient ENT consultation and referral for further workup and evaluation could be performed as warranted. Emphysema (ICD10-J43.9). Electronically Signed   By: BPincus BadderD.  On: 04/14/2021 03:25  ? ?MR BRAIN WO CONTRAST ? ?Result Date: 04/13/2021 ?CLINICAL DATA:  Initial evaluation for neuro deficit, stroke suspected. EXAM: MRI HEAD WITHOUT CONTRAST TECHNIQUE: Multiplanar, multiecho pulse sequences of the brain and surrounding structures were obtained without intravenous contrast. COMPARISON:  Prior CT from earlier the same day. FINDINGS: Brain: Diffuse prominence of the CSF containing spaces  compatible generalized age-related cerebral atrophy. Patchy and confluent T2/FLAIR hyperintensity involving the periventricular deep white matter both cerebral hemispheres most consistent with chronic small vessel

## 2021-04-14 NOTE — Progress Notes (Signed)
EEG complete - results pending 

## 2021-04-14 NOTE — Progress Notes (Signed)
? LOS: 0 days  ?Patient Summary: Mariah Henderson is a 71 y.o. 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 ? ?Overnight Events: No acute overnight events. ? ?Interim History: Patient seen and evaluated by team on rounds. Patient reports that she does not remember much from yesterday, but that she took her medications in the morning. She says that she is feeling good and she is hungry. She has no acute concerns. Her sisters Mag and Regino Schultze were present at bedside. ? ?Objective: ?Vital signs in last 24 hours: ?Vitals:  ? 04/13/21 1715 04/13/21 1950 04/13/21 2307 04/14/21 0340  ?BP:  123/70 110/75 108/66  ?Pulse:  89 95 93  ?Resp:  14 18   ?Temp:  97.6 ?F (36.4 ?C) 97.6 ?F (36.4 ?C) 98 ?F (36.7 ?C)  ?TempSrc:  Oral Oral Oral  ?SpO2:  100% 100% 98%  ?Weight: 51.6 kg     ?Height: 5' (1.524 m)     ? ?SpO2: 98 % ?O2 Flow Rate (L/min): 2 L/min ?Weight change:  ? ?Intake/Output Summary (Last 24 hours) at 04/14/2021 0715 ?Last data filed at 04/14/2021 0400 ?Gross per 24 hour  ?Intake --  ?Output 300 ml  ?Net -300 ml  ? ?Physical Exam ?Constitutional: Awake, alert, cooperative, laying in bed comfortably. No acute distress. ?Head: Normocephalic, atraumatic.  ?Eyes: PERRLA, EOMI, non-icteric sclera.  ?Cardio: RRR, normal S1 and S2. No murmurs, rubs or gallops appreciated. ?Pulm: Normal work of breathing. Lungs clear to auscultation. No wheezes. ?Abdomen: Soft, non-tender, non-distended. Normoactive bowel sounds. ?MSK: Moves all extremities equally. ?Skin: Warm and dry ?Neurologic exam: ?Mental status: Awake, alert, and oriented to person, place and year. Not oriented to specific date. ?Cranial Nerves: ?            II: PERRLA ?            III, IV, VI: Extra-occular motions intact bilaterally ?            V, VII: Face symmetric, sensation intact in  all 3 divisions   ?            IX, X: palate rises symmetrically ?            XI: Shoulder shrug normal bilaterally   ?            XII: Tongue midline    ?Motor: Strength 5/5 on upper and lower extremities bilaterally, muscle tone is appropriate. ?Cerebellar: Some difficulty with following directions, but no dysmetria on finger-to-nose test. ?Psych: Normal mood and affect. ? ? ?Lab Results: ? ?  Latest Ref Rng & Units 04/14/2021  ?  4:51 AM 04/13/2021  ?  1:32 PM 04/13/2021  ?  1:27 PM  ?CBC  ?WBC 4.0 - 10.5 K/uL 16.3    15.6    ?Hemoglobin 12.0 - 15.0 g/dL 11.4   13.6   11.6    ?Hematocrit 36.0 - 46.0 % 38.8   40.0   39.4    ?Platelets 150 - 400 K/uL 155    203    ? ? ?  Latest Ref Rng & Units 04/14/2021  ?  4:51 AM 04/13/2021  ?  1:32 PM 04/13/2021  ?  1:27 PM  ?BMP  ?Glucose 70 - 99 mg/dL 123   190   187    ?BUN 8 - 23  mg/dL '12   18   14    '$ ?Creatinine 0.44 - 1.00 mg/dL 0.61   0.60   0.70    ?Sodium 135 - 145 mmol/L 138   135   135    ?Potassium 3.5 - 5.1 mmol/L 4.3   4.9   4.5    ?Chloride 98 - 111 mmol/L 98   96   97    ?CO2 22 - 32 mmol/L 33    30    ?Calcium 8.9 - 10.3 mg/dL 9.8    9.8    ?  ?TSH 0.619 ?Mg 1.8 ?Salicylate <3.0 ?Blood cultures NGTD ? ?Micro Results: ?No results found for this or any previous visit (from the past 240 hour(s)). ?Studies/Results: ?CT ANGIO HEAD NECK W WO CM ? ?Result Date: 04/14/2021 ?CLINICAL DATA:  Follow-up examination for stroke. EXAM: CT ANGIOGRAPHY HEAD AND NECK TECHNIQUE: Multidetector CT imaging of the head and neck was performed using the standard protocol during bolus administration of intravenous contrast. Multiplanar CT image reconstructions and MIPs were obtained to evaluate the vascular anatomy. Carotid stenosis measurements (when applicable) are obtained utilizing NASCET criteria, using the distal internal carotid diameter as the denominator. RADIATION DOSE REDUCTION: This exam was performed according to the departmental dose-optimization program which includes automated  exposure control, adjustment of the mA and/or kV according to patient size and/or use of iterative reconstruction technique. CONTRAST:  48m OMNIPAQUE IOHEXOL 350 MG/ML SOLN COMPARISON:  CT and MRI from 04/13/2021. FINDINGS: CT HEAD FINDINGS Brain: Age-related cerebral atrophy with moderate chronic microvascular ischemic disease. No acute intracranial hemorrhage or large vessel territory infarct. No mass lesion or midline shift. No hydrocephalus or extra-axial fluid collection. Vascular: No hyperdense vessel. Skull: Scalp soft tissues and calvarium within normal limits. Sinuses: Clear. Orbits: Globes orbital soft tissues demonstrate no acute finding. Review of the MIP images confirms the above findings CTA NECK FINDINGS Aortic arch: Visualized aortic arch normal caliber with normal branch pattern. Mild atheromatous change about the origin the great vessels without significant stenosis. Right carotid system: Right common and internal carotid arteries tortuous but widely patent without stenosis or dissection. Minimal plaque about the right carotid bulb without stenosis. Left carotid system: Left common and internal carotid arteries tortuous but patent without stenosis or dissection. Minimal plaque about the left carotid bulb without stenosis. Vertebral arteries: Both vertebral arteries arise from subclavian arteries. No proximal subclavian artery stenosis. Proximal left V1 and V2 segments not well seen due to streak artifact from cervical ACDF. Visualized portions of the vertebral arteries patent without stenosis or dissection. Skeleton: No discrete or worrisome osseous lesions. Prior ACDF at C5-6 and C6-7. Other neck: No other acute soft tissue abnormality within the neck. 1.4 cm soft tissue mass present at the posterior aspect of the right parotid gland (series 12, image 83), indeterminate. Upper chest: Advanced emphysema. Known metastatic disease with scattered pulmonary nodules noted, partially visualized.  Layering right pleural effusion with associated atelectasis. Right-sided Port-A-Cath in place. Layering secretions noted within the subglottic trachea. Review of the MIP images confirms the above findings CTA HEAD FINDINGS Anterior circulation: Petrous segments patent bilaterally. Mild for age atheromatous change within the carotid siphons without significant stenosis. A1 segments, anterior communicating artery complex, and anterior cerebral arteries patent without abnormality or stenosis. M1 segments widely patent. No proximal MCA branch occlusion. Distal MCA branches perfused and symmetric. Posterior circulation: Both V4 segments patent without stenosis. Left PICA patent. Right PICA not well seen. Basilar patent to its distal aspect without stenosis.  Superior cerebellar and posterior cerebral arteries patent bilaterally without significant stenosis. Short-segment mild stenosis noted at the junction of the left P1/P2 segments. Venous sinuses: Not well assessed due to arterial timing the contrast bolus. Anatomic variants: None significant.  No aneurysm. Review of the MIP images confirms the above findings IMPRESSION: 1. Negative CTA for large vessel occlusion or other emergent finding. 2. Mild for age atheromatous disease. No hemodynamically significant or correctable stenosis. 3. Diffuse tortuosity of the major arterial vasculature of the head and neck, suggesting chronic underlying hypertension. 4. Multiple scattered pulmonary nodules within the visualized lungs, consistent with known metastatic disease. 5. Layering right pleural effusion with associated atelectasis, partially visualized. 6. 1.4 cm soft tissue mass at the posterior aspect of the right parotid gland, indeterminate. Outpatient ENT consultation and referral for further workup and evaluation could be performed as warranted. Emphysema (ICD10-J43.9). Electronically Signed   By: Jeannine Boga M.D.   On: 04/14/2021 03:25  ? ?MR BRAIN WO  CONTRAST ? ?Result Date: 04/13/2021 ?CLINICAL DATA:  Initial evaluation for neuro deficit, stroke suspected. EXAM: MRI HEAD WITHOUT CONTRAST TECHNIQUE: Multiplanar, multiecho pulse sequences of the brain and surrounding s

## 2021-04-14 NOTE — TOC Initial Note (Signed)
Transition of Care (TOC) - Initial/Assessment Note  ? ? ?Patient Details  ?Name: Mariah Henderson ?MRN: [id number] ?Date of Birth: [birth year] ? ?Transition of Care (TOC) CM/SW Contact:    ?Pollie Friar, RN ?Phone Number: ?04/14/2021, 2:13 PM ? ?Clinical Narrative:                 ?CM met with the patient and 2 of her sisters in the patients room. Patient lives with a friend at home but the friend may be moving out at the beginning of April and pt would be alone. Per sisters they are not able to provide the support the patient would need at home. Pt and family are interested in SNF rehab. They are in agreement with her being faxed out in the [facility] area.  ?Pt has just started on Po chemo medications this week. Will need to find out if the medications will be continued and her f/u with the cancer center for the rehab.  ?TOC following. ? ?Expected Discharge Plan: [place] ?Barriers to Discharge: Continued Medical Work up ? ? ?Patient Goals and CMS Choice ?  ?CMS Medicare.gov Compare Post Acute Care list provided to:: Patient Represenative (must comment) ?Choice offered to / list presented to : Patient, Sibling ? ?Expected Discharge Plan and Services ?Expected Discharge Plan: [place] ?In-house Referral: Clinical Social Work ?Discharge Planning Services: CM Consult ?Post Acute Care Choice: [place] ?Living arrangements for the past 2 months: Apartment ?                ?  ?  ?  ?  ?  ?  ?  ?  ?  ?  ? ?Prior Living Arrangements/Services ?Living arrangements for the past 2 months: Apartment ?Lives with:: Friends ?Patient language and need for interpreter reviewed:: Yes ?Do you feel safe going back to the place where you live?: Yes      ?Need for Family Participation in Patient Care: Yes (Comment) ?Care giver support system in place?: No (comment) ?Current home services: DME (walker/ 3 in 1/ shower seat) ?Criminal Activity/Legal Involvement Pertinent to Current  Situation/Hospitalization: No - Comment as needed ? ?Activities of Daily Living ?  ?  ? ?Permission Sought/Granted ?  ?  ?   ?   ?   ?   ? ?Emotional Assessment ?Appearance:: Appears stated age ?Attitude/Demeanor/Rapport: Engaged ?Affect (typically observed): Accepting ?Orientation: : Oriented to Self, Oriented to Place, Oriented to  Time, Oriented to Situation ?  ?Psych Involvement: No (comment) ? ?Admission diagnosis:  Metabolic encephalopathy [I29.79] ?Encephalopathy [G93.40] ?Metastatic malignant neoplasm, unspecified site [facility]) [C79.9] ?Patient Active Problem List  ? Diagnosis Date Noted  ? Pressure injury of skin 04/14/2021  ? Metabolic encephalopathy 89[birth year]/1941  ? Finger laceration 12/28/2020  ? 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 Elevated temperature 04/08/2020  ? Mouth sore 03/31/2020  ? Genetic testing 03/08/2020  ? Port-A-Cath in place 02/26/2020  ? Goals of care, counseling/discussion 02/11/2020  ? Pancreatic cancer ([place]) 01/21/2020  ? Decreased appetite 11/14/2019  ? 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 Dysphagia 12/12/2017  ? Arthritis of right sternoclavicular joint 11/08/2017  ? Full thickness rotator cuff tear 11/08/2017  ? Cervical stenosis of spine 09/26/2017  ? Disorder of right rotator cuff 09/26/2017  ? Vertigo 09/02/2017  ? Other meniscus derangements, posterior horn of medial meniscus, right knee 06/06/2016  ? GERD (gastroesophageal reflux disease) 08/05/2015  ? Cervical spondylosis with radiculopathy 10/05/2014  ?  Fibrocystic changes of left breast 07/02/2014  ? Osteopenia 04/01/2014  ? Shoulder pain, right 10/21/2012  ? Lower extremity edema 06/17/2012  ? Seasonal allergies 04/29/2012  ? Reactive airway disease with wheezing 04/15/2012  ? Preventative health care 03/04/2012  ? Glaucoma 05/26/2011  ? Osteoarthritis 06/30/2008  ? Allergic rhinitis 05/01/2008  ? Class 2 obesity with body mass index (BMI) of 35 to 39.9 without  comorbidity 12/06/2005  ? Depression, major, 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[place]) 12/06/2005  ? Essential hypertension 12/06/2005  ? Back pain 12/06/2005  ? ?PCP:  Sid Falcon, MD ?Pharmacy:   ?[facility] [place] [place] ?[place] ?[place] [address] ?Phone: [phone] Fax: [phone] ? ?[facility] ([place], [address] ?[address] ?[address] ?[place] [address] ?Phone: [phone] Fax: [phone] ? ?[id number] - [place], [place] [address] ?[place] [address] ?Phone: [phone] Fax: [phone] ? ?[facility] ([facility] ) - [place], [address] ?[address] ?[place] [address] ?Phone: [phone] Fax: [phone] ? ?[facility] ?[address] ?[place] [address] ?Phone: [phone] Fax: [phone] ? ? ? ? ?Social Determinants of Health (SDOH) Interventions ?  ? ?Readmission Risk Interventions ?   ? View : No data to display.  ?  ?  ?  ? ? ? ?

## 2021-04-14 NOTE — Procedures (Signed)
Patient Name: Mariah Henderson  ?MRN: [id number]  ?Epilepsy Attending: Lora Havens  ?Referring Physician/Provider: Derek Jack, MD ?Date: 04/14/2021 ?Duration: 22.51 mins ? ?Patient history: 71yo F with ams. EEG to evaluate for seizure.  ? ?Level of alertness: Awake ? ?AEDs during EEG study: None ? ?Technical aspects: This EEG study was done with scalp electrodes positioned according to the 10-20 International system of electrode placement. Electrical activity was acquired at a sampling rate of '500Hz'$  and reviewed with a high frequency filter of '70Hz'$  and a low frequency filter of '1Hz'$ . EEG data were recorded continuously and digitally stored.  ? ?Description: No clear posterior dominant rhythm was seen. EEG showed continuous generalized 6-'9Hz'$  theta activity admixed with 2-'3Hz'$  delta slowing. Hyperventilation and photic stimulation were not performed.    ? ?ABNORMALITY ?- Continuous slow, generalized ? ?MPRESSION: ?This study is suggestive of moderate diffuse encephalopathy, nonspecific etiology. No seizures or epileptiform discharges were seen throughout the recording. ? ?Lora Havens  ? ?

## 2021-04-14 NOTE — Evaluation (Signed)
Physical Therapy Evaluation ?Patient Details ?Name: Mariah Henderson ?MRN: [id number] ?DOB: 09-Apr-1950 ?Today's Date: 04/14/2021 ? ?History of Present Illness ? Pt. is a 71 y.o. F. presenting to [facility] on [facility] after being found unresponsive at home. Only change in her routine was taking a new cancer medication Lonie Peak) and not taking her morning morphine. CT shows possible R pons infarct and MRI shows no acute infarct, both show age related cerebral atrophy. CXR shows Mixed interstitial and airspace opacities bilaterally, likely a  combination pulmonary edema and underlying metastatic disease. Pt also has an unstagable pressure wound on her L hip. 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  ?Clinical Impression ?  ?Pt presents with generalized weakness, impaired balance with preference for posterior leaning, AMS vs baseline, and decreased activity tolerance. Pt to benefit from acute PT to address deficits. Pt ambulated short hallway distance, overall requiring min phsyical assist to mobilize at this time. Pt states she lives alone, per chart review pt lives with her niece, unsure of accuracy of home set up but if pt is to go home will need constant supervision initially. PT to progress mobility as tolerated, and will continue to follow acutely.  ? ?   ?  ? ?Recommendations for follow up therapy are one component of a multi-disciplinary discharge planning process, led by the attending physician.  Recommendations may be updated based on patient status, additional functional criteria and insurance authorization. ? ?Follow Up Recommendations Skilled nursing-short term rehab (<3 hours/day) (vs HHPT with increased family support, pt states she lives at home alone and unable to verify this with family) ? ?  ?Assistance Recommended at Discharge Frequent or constant Supervision/Assistance  ?Patient can return home with the following ? A little help with walking  and/or transfers;A little help with bathing/dressing/bathroom;Direct supervision/assist for medications management;Assist for transportation;Assistance with cooking/housework ? ?  ?Equipment Recommendations None recommended by PT  ?Recommendations for Other Services ?    ?  ?Functional Status Assessment Patient has had a recent decline in their functional status and demonstrates the ability to make significant improvements in function in a reasonable and predictable amount of time.  ? ?  ?Precautions / Restrictions Precautions ?Precautions: Fall ?Restrictions ?Weight Bearing Restrictions: No  ? ?  ? ?Mobility ? Bed Mobility ?Overal bed mobility: Needs Assistance ?Bed Mobility: Supine to Sit, Sit to Supine ?  ?  ?Supine to sit: Min guard ?Sit to supine: Min assist ?  ?General bed mobility comments: assist for bringing LEs back into bed, increased time and effort. ?  ? ?Transfers ?Overall transfer level: Needs assistance ?Equipment used: Rolling walker (2 wheels) ?Transfers: Sit to/from Stand ?Sit to Stand: Min assist ?  ?  ?  ?  ?  ?General transfer comment: assist to steady and correct heavy posterior bias initially. ?  ? ?Ambulation/Gait ?Ambulation/Gait assistance: Min assist ?Gait Distance (Feet): 60 Feet ?Assistive device: Rolling walker (2 wheels) ?Gait Pattern/deviations: Step-through pattern, Decreased stride length, Trunk flexed ?Gait velocity: decr ?  ?  ?General Gait Details: cues for upright posture as opposed to posterior leaning, increasing bilat foot clearance, hallway navigation ? ?Stairs ?  ?  ?  ?  ?  ? ?Wheelchair Mobility ?  ? ?Modified Rankin (Stroke Patients Only) ?  ? ?  ? ?Balance Overall balance assessment: Needs assistance ?Sitting-balance support: No upper extremity supported, Feet unsupported ?Sitting balance-Leahy Scale: Fair ?Sitting balance - Comments: able to sit EOB without PT  support ?  ?Standing balance support: Bilateral upper extremity supported, During functional  activity ?Standing balance-Leahy Scale: Poor ?Standing balance comment: reliant on external support ?  ?  ?  ?  ?  ?  ?  ?  ?  ?  ?  ?   ? ? ? ?Pertinent Vitals/Pain Pain Assessment ?Pain Assessment: Faces ?Faces Pain Scale: Hurts a little bit ?Pain Location: R hip ?Pain Descriptors / Indicators: Discomfort, Sore ?Pain Intervention(s): Limited activity within patient's tolerance, Monitored during session, Repositioned  ? ? ?Home Living Family/patient expects to be discharged to:: Private residence ?Living Arrangements: Alone ?Available Help at Discharge: Family;Available PRN/intermittently (sisters, bother close and regularly on check on her) ?Type of Home: Apartment ?Home Access: Level entry ?  ?  ?  ?Home Layout: One level ?Home Equipment: Conservation officer, nature (2 wheels);Hand held shower head;BSC/3in1;Grab bars - toilet;Grab bars - tub/shower;Cane - single point ?   ?  ?Prior Function Prior Level of Function : Independent/Modified Independent ?  ?  ?  ?  ?  ?  ?Mobility Comments: Uses cane and walker depending on how far she is going ?ADLs Comments: independent, drive, sisters can help, can make food but typically goes out to buy it. ?  ? ? ?Hand Dominance  ? Dominant Hand: Right ? ?  ?Extremity/Trunk Assessment  ? Upper Extremity Assessment ?Upper Extremity Assessment: Defer to OT evaluation ?  ? ?Lower Extremity Assessment ?Lower Extremity Assessment: Generalized weakness ?  ? ?Cervical / Trunk Assessment ?Cervical / Trunk Assessment: Kyphotic  ?Communication  ? Communication: HOH  ?Cognition Arousal/Alertness: Awake/alert ?Behavior During Therapy: [facility] for tasks assessed/performed ?Overall Cognitive Status: Impaired/Different from baseline ?Area of Impairment: Orientation, Memory, Following commands, Safety/judgement, Problem solving ?  ?  ?  ?  ?  ?  ?  ?  ?Orientation Level: Disoriented to, Time, Situation ?  ?Memory: Decreased short-term memory ?Following Commands: Follows one step commands with increased  time ?Safety/Judgement: Decreased awareness of safety, Decreased awareness of deficits ?  ?Problem Solving: Slow processing, Decreased initiation, Difficulty sequencing, Requires verbal cues, Requires tactile cues ?General Comments: Pt states the month is January, states she is here for "a lot of reasons" but unable to pinpoint reason for admission. Pt lacks insight into deficits, heavy posterior leaning upon initial standing and intermittently throughout. Some things limited by [facility] ?  ?  ? ?  ?General Comments General comments (skin integrity, edema, etc.): L hip wound covered with mepilex ? ?  ?Exercises    ? ?Assessment/Plan  ?  ?PT Assessment Patient needs continued PT services  ?PT Problem List Decreased strength;Decreased mobility;Decreased balance;Decreased knowledge of use of DME;Pain;Decreased activity tolerance;Decreased safety awareness;Decreased skin integrity;Decreased cognition ? ?   ?  ?PT Treatment Interventions DME instruction;Therapeutic activities;Gait training;Patient/family education;Therapeutic exercise;Balance training;Functional mobility training;Neuromuscular re-education   ? ?PT Goals (Current goals can be found in the Care Plan section)  ?Acute Rehab PT Goals ?PT Goal Formulation: With patient ?Time For Goal Achievement: 04/28/21 ?Potential to Achieve Goals: Good ? ?  ?Frequency Min 3X/week ?  ? ? ?Co-evaluation   ?  ?  ?  ?  ? ? ?  ?AM-PAC PT "6 Clicks" Mobility  ?Outcome Measure Help needed turning from your back to your side while in a flat bed without using bedrails?: A Little ?Help needed moving from lying on your back to sitting on the side of a flat bed without using bedrails?: A Little ?Help needed moving to and from a bed to a chair (  including a wheelchair)?: A Little ?Help needed standing up from a chair using your arms (e.g., wheelchair or bedside chair)?: A Little ?Help needed to walk in hospital room?: A Little ?Help needed climbing 3-5 steps with a railing? : A Lot ?6 Click  Score: 17 ? ?  ?End of Session Equipment Utilized During Treatment: Other (comment) (3LO2 reapplied at end of session) ?Activity Tolerance: Patient tolerated treatment well ?Patient left: in bed;with call bell/phone within reach;

## 2021-04-14 NOTE — Progress Notes (Signed)
This chaplain responded to PMT consult for creating/updating the Pt. Advance Directive and ongoing spiritual care. The chaplain understands from the PMT update the Pt. choice for HCPOA is the Pt. son who lives in [place]. ? ?The chaplain introduced herself and the reason for the visit. Members of the Pt. clergy and family are visiting at the bedside. The chaplain will plan a re-visit  ? ?Chaplain Sallyanne Kuster ?[phone] ?

## 2021-04-15 ENCOUNTER — Other Ambulatory Visit (HOSPITAL_COMMUNITY): Payer: Self-pay

## 2021-04-15 ENCOUNTER — Encounter (HOSPITAL_COMMUNITY): Payer: Self-pay | Admitting: Internal Medicine

## 2021-04-15 ENCOUNTER — Other Ambulatory Visit: Payer: Self-pay

## 2021-04-15 DIAGNOSIS — I2510 Atherosclerotic heart disease of native coronary artery without angina pectoris: Secondary | ICD-10-CM | POA: Diagnosis present

## 2021-04-15 DIAGNOSIS — Z7951 Long term (current) use of inhaled steroids: Secondary | ICD-10-CM | POA: Diagnosis not present

## 2021-04-15 DIAGNOSIS — G893 Neoplasm related pain (acute) (chronic): Secondary | ICD-10-CM | POA: Diagnosis present

## 2021-04-15 DIAGNOSIS — Z515 Encounter for palliative care: Secondary | ICD-10-CM | POA: Diagnosis not present

## 2021-04-15 DIAGNOSIS — R630 Anorexia: Secondary | ICD-10-CM | POA: Diagnosis present

## 2021-04-15 DIAGNOSIS — E785 Hyperlipidemia, unspecified: Secondary | ICD-10-CM | POA: Diagnosis present

## 2021-04-15 DIAGNOSIS — K5900 Constipation, unspecified: Secondary | ICD-10-CM | POA: Diagnosis not present

## 2021-04-15 DIAGNOSIS — C799 Secondary malignant neoplasm of unspecified site: Secondary | ICD-10-CM | POA: Diagnosis not present

## 2021-04-15 DIAGNOSIS — C7800 Secondary malignant neoplasm of unspecified lung: Secondary | ICD-10-CM | POA: Diagnosis present

## 2021-04-15 DIAGNOSIS — Z79899 Other long term (current) drug therapy: Secondary | ICD-10-CM | POA: Diagnosis not present

## 2021-04-15 DIAGNOSIS — E43 Unspecified severe protein-calorie malnutrition: Secondary | ICD-10-CM | POA: Diagnosis present

## 2021-04-15 DIAGNOSIS — Z7189 Other specified counseling: Secondary | ICD-10-CM | POA: Diagnosis not present

## 2021-04-15 DIAGNOSIS — Z7983 Long term (current) use of bisphosphonates: Secondary | ICD-10-CM | POA: Diagnosis not present

## 2021-04-15 DIAGNOSIS — I10 Essential (primary) hypertension: Secondary | ICD-10-CM | POA: Diagnosis present

## 2021-04-15 DIAGNOSIS — G9340 Encephalopathy, unspecified: Secondary | ICD-10-CM | POA: Diagnosis not present

## 2021-04-15 DIAGNOSIS — G928 Other toxic encephalopathy: Secondary | ICD-10-CM | POA: Diagnosis present

## 2021-04-15 DIAGNOSIS — K219 Gastro-esophageal reflux disease without esophagitis: Secondary | ICD-10-CM | POA: Diagnosis present

## 2021-04-15 DIAGNOSIS — J449 Chronic obstructive pulmonary disease, unspecified: Secondary | ICD-10-CM | POA: Diagnosis present

## 2021-04-15 DIAGNOSIS — D72829 Elevated white blood cell count, unspecified: Secondary | ICD-10-CM | POA: Diagnosis present

## 2021-04-15 DIAGNOSIS — Z66 Do not resuscitate: Secondary | ICD-10-CM | POA: Diagnosis present

## 2021-04-15 DIAGNOSIS — E860 Dehydration: Secondary | ICD-10-CM | POA: Diagnosis present

## 2021-04-15 DIAGNOSIS — C259 Malignant neoplasm of pancreas, unspecified: Secondary | ICD-10-CM | POA: Diagnosis present

## 2021-04-15 DIAGNOSIS — R1310 Dysphagia, unspecified: Secondary | ICD-10-CM | POA: Diagnosis present

## 2021-04-15 DIAGNOSIS — G9341 Metabolic encephalopathy: Secondary | ICD-10-CM | POA: Diagnosis not present

## 2021-04-15 DIAGNOSIS — C787 Secondary malignant neoplasm of liver and intrahepatic bile duct: Secondary | ICD-10-CM | POA: Diagnosis not present

## 2021-04-15 DIAGNOSIS — L89220 Pressure ulcer of left hip, unstageable: Secondary | ICD-10-CM | POA: Diagnosis present

## 2021-04-15 DIAGNOSIS — E1142 Type 2 diabetes mellitus with diabetic polyneuropathy: Secondary | ICD-10-CM | POA: Diagnosis present

## 2021-04-15 DIAGNOSIS — M21379 Foot drop, unspecified foot: Secondary | ICD-10-CM | POA: Diagnosis present

## 2021-04-15 LAB — BASIC METABOLIC PANEL
Anion gap: 6 (ref 5–15)
BUN: 15 mg/dL (ref 8–23)
CO2: 31 mmol/L (ref 22–32)
Calcium: 10 mg/dL (ref 8.9–10.3)
Chloride: 96 mmol/L — ABNORMAL LOW (ref 98–111)
Creatinine, Ser: 0.72 mg/dL (ref 0.44–1.00)
GFR, Estimated: >60 mL/min (ref >60)
Glucose, Bld: 153 mg/dL — ABNORMAL HIGH (ref 70–99)
Potassium: 4.5 mmol/L (ref 3.5–5.1)
Sodium: 133 mmol/L — ABNORMAL LOW (ref 135–145)

## 2021-04-15 LAB — BLOOD CULTURE ID PANEL (REFLEXED) - BCID2

## 2021-04-15 LAB — CBC
HCT: 36.2 % (ref 36.0–46.0)
Hemoglobin: 10.8 g/dL — ABNORMAL LOW (ref 12.0–15.0)
MCH: 24.2 pg — ABNORMAL LOW (ref 26.0–34.0)
MCHC: 29.8 g/dL — ABNORMAL LOW (ref 30.0–36.0)
MCV: 81 fL (ref 80.0–100.0)
Platelets: 170 10*3/uL (ref 150–400)
RBC: 4.47 MIL/uL (ref 3.87–5.11)
RDW: 27 % — ABNORMAL HIGH (ref 11.5–15.5)
WBC: 19.5 10*3/uL — ABNORMAL HIGH (ref 4.0–10.5)
nRBC: 0.1 % (ref 0.0–0.2)

## 2021-04-15 MED ORDER — VANCOMYCIN HCL 750 MG/150ML IV SOLN: 750.0000 mg | INTRAVENOUS | Status: DC

## 2021-04-15 MED ORDER — VANCOMYCIN HCL IN DEXTROSE 1-5 GM/200ML-% IV SOLN
1000.0000 mg | Freq: Once | INTRAVENOUS | Status: AC
  Administered 2021-04-15: 1000 mg via INTRAVENOUS
  Filled 2021-04-15: qty 200

## 2021-04-15 MED ORDER — ENSURE ENLIVE PO LIQD
237.0000 mL | Freq: Three times a day (TID) | ORAL | Status: DC
  Administered 2021-04-15 – 2021-04-21 (×14): 237 mL via ORAL

## 2021-04-15 MED ORDER — ADULT MULTIVITAMIN W/MINERALS CH
1.0000 | ORAL_TABLET | Freq: Every day | ORAL | Status: DC
  Administered 2021-04-15 – 2021-04-18 (×4): 1 via ORAL
  Filled 2021-04-15 (×5): qty 1

## 2021-04-15 NOTE — Progress Notes (Signed)
?Daily Progress Note  ? ?Patient Name: Mariah Henderson       Date: 04/15/2021 ?DOB: Jun 05, 1950  Age: 71 y.o. MRN#: [id number] ?Attending Physician: Lucious Groves, DO ?Primary Care Physician: Sid Falcon, MD ?Admit Date: 04/13/2021 ?Length of Stay: 0 days ? ?Reason for Consultation/Follow-up: Establishing goals of care ? ?HPI/Patient Profile:  71 y.o. 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 Family felt patient's conversation was becoming abnormal and sister went to check on her, found unresponsive in the bed. Brought to the ER as Code Stroke. Recently started new medication (5th line treatment for pancreatic CA), noted confused. Brain imaging less suspicious for CVA. She was admitted on 3/33/5456 with Metabolic encephalopathy, Stage IV pancreatic CA (mets to liver and lungs, progressive), and others.  ?  ?PMT consulted for [place] discussions. Of note, has recently seen [name], NP in the [place] clinic on 04/06/21. Made adjustments to pain regimen for increasing pain likely due to liver tumor burden, medication adjustments for constipation, began discussions on [place] but no official decisions made that day. ? ?PMT consulted for [place] conversations. ? ?Subjective:  ? ?Subjective: ?Chart Reviewed. Updates received. Patient Assessed. Created space and opportunity for patient  and family to explore thoughts and feelings regarding current medical situation. ? ?Today's Discussion: She is awake and pleasant today, smiling and appropriately interactive. Discussed myt conversation with her son and she is glad he's coming up to see her today. Discussed allowing time for family to visit and "love on her today" with plans for family meeting tomorrow at 10:00 am and she is happy for this. She denies anty pain this morning. Denies dyspnea, dysphagia,  N/V. No other complaints this morning. Discussed purpose of family meeting to know what her wishes are for her care and her overall goals related to the type of care she receives. ? ?I provided emotional and general support through therapeutic listening, empathy, sharing o stories, humor, and other techniques. I answered all questions and addressed all concerns to the best of my ability. ? ?Review of Systems  ?Constitutional:   ?     Denies pain in general; wants to get up and ambulate in the hall  ?Respiratory:  Negative for cough and shortness of breath.   ?Gastrointestinal:  Negative for abdominal pain, nausea and vomiting.  ? ?Objective:  ? ?Vital Signs:  ?BP 108/74 (BP Location: Right Arm)   Pulse 99   Temp 98 ?F (36.7 ?C) (Oral)   Resp 18   Ht 5' (1.524 m)   Wt 51.6 kg   LMP 04/29/1966 (LMP Unknown)   SpO2 92%   BMI 22.22 kg/m?  ? ?Physical Exam: ?Physical Exam ?Vitals and nursing note reviewed.  ?Constitutional:   ?   General: She is not in acute distress. ?   Appearance: She is ill-appearing.  ?HENT:  ?   Head: Normocephalic and atraumatic.  ?Cardiovascular:  ?   Rate and Rhythm: Normal rate.  ?Pulmonary:  ?   Effort: Pulmonary effort is normal.  ?Abdominal:  ?   General: Abdomen is flat.  ?   Palpations: Abdomen is soft.  ?Skin: ?   General: Skin is warm and dry.  ?Neurological:  ?   Mental Status: She is alert.  ?   Comments: Pleasently confused ut answers questions appropriately  ? ? ?Palliative Assessment/Data:  40-50% ? ? ?Assessment & Plan:  ? ?Impression: ?Present on Admission: ? Metabolic encephalopathy ? ?71 year old female with stage IV pancreatic cancer with progressive mets to liver and lung, worsening of her disease burden despite oncology treatment and is currently on fifth line treatment (not chemotherapy oral medication).  She presented with confusion and work-up is ongoing as to the etiology, likely multifactorial given progressive disease, dehydration, elevated white count. Also with  new positive blood cultures, planned start vancomycin.  EEG with global changes, nothing acute.  Brain imaging did not show acute stroke to explain her confusion.  She has begun goals of care conversation with her family.  She did elect DNR yesterday and confirmed this as well as understanding of what this means.  She seems to have a firm grasp on her clinical situation, even though she is confused to the year.  Poor overall prognosis and I feel the patient would benefit from family meeting with goals of care discussions. Spoke with son yesterday, scheduled family meeting 4/1 at 10:00 am ? ?SUMMARY OF RECOMMENDATIONS   ?Remain DNR ?Scheduled family meeting 4/1 10:00 am ?The patient would be a candidate for hospice should this be the road that she elects to follow ?Chaplain consult for spiritual care and HCPOA completion, HCPOA likely Monday at earliest ?PMT will continue to follow ? ?Symptom Management:  ?Per primary team ? ?Code Status: DNR ? ?Prognosis: Unable to determine ? ?Discharge Planning: To Be Determined ? ?Discussed with: Patient, medical team, nursing team, PT team ? ?Billing based on MDM: High ? ?Problems Addressed: One acute or chronic illness or injury that poses a threat to life or bodily function ? ?Amount and/or Complexity of Data: Category 3:Discussion of management or test interpretation with external physician/other qualified health care professional/appropriate source (not separately reported) ? ?Risks: Decision not to resuscitate or to de-escalate care because of poor prognosis and ongoing discussion on goals of care   ? ?Visit consisted of counseling and education dealing with the complex and emotionally intense issues of symptom management and palliative care in the setting of serious and potentially life-threatening illness. Greater than 50%  of this time was spent counseling and coordinating care related to the above assessment and plan. ? ?[name], NP ?Palliative Medicine Team ? ?Team  Phone # [phone] (Nights/Weekends) ? 09/14/2020, 8:17 AM  ? ?

## 2021-04-15 NOTE — Plan of Care (Signed)
  Problem: Education: Goal: Knowledge of disease or condition will improve Outcome: Progressing Goal: Knowledge of secondary prevention will improve (SELECT ALL) Outcome: Progressing   

## 2021-04-15 NOTE — Progress Notes (Addendum)
STROKE TEAM PROGRESS NOTE  ? ?INTERVAL HISTORY ?Patient has been hemodynamically stable overnight with no acute events or neurological changes.  She in fact seems more alert and interactive today but remains disoriented with poor insight into her condition.  Family meeting with palliative care is scheduled for tomorrow at 1000. ? ? ?Vitals:  ? 04/15/21 0344 04/15/21 0740 04/15/21 0850 04/15/21 1116  ?BP: 111/74 108/74  114/76  ?Pulse: (!) 102 99  93  ?Resp: '18 18  16  '$ ?Temp: 98.2 ?F (36.8 ?C) 98 ?F (36.7 ?C)  98.1 ?F (36.7 ?C)  ?TempSrc: Oral Oral  Oral  ?SpO2: 95% 96% 92% 96%  ?Weight:      ?Height:      ? ?CBC:  ?Recent Labs  ?Lab 04/13/21 ?1327 04/13/21 ?1332 04/14/21 ?0451 04/15/21 ?0809  ?WBC 15.6*  --  16.3* 19.5*  ?NEUTROABS 15.0*  --   --   --   ?HGB 11.6*   < > 11.4* 10.8*  ?HCT 39.4   < > 38.8 36.2  ?MCV 80.7  --  80.8 81.0  ?PLT 203  --  155 170  ? < > = values in this interval not displayed.  ? ? ?Basic Metabolic Panel:  ?Recent Labs  ?Lab 04/13/21 ?1645 04/14/21 ?0451 04/15/21 ?0809  ?NA  --  138 133*  ?K  --  4.3 4.5  ?CL  --  98 96*  ?CO2  --  33* 31  ?GLUCOSE  --  123* 153*  ?BUN  --  12 15  ?CREATININE  --  0.61 0.72  ?CALCIUM  --  9.8 10.0  ?MG 1.8  --   --   ? ? ?Lipid Panel: No results for input(s): CHOL, TRIG, HDL, CHOLHDL, VLDL, LDLCALC in the last 168 hours. ?HgbA1c: No results for input(s): HGBA1C in the last 168 hours. ?Urine Drug Screen: No results for input(s): LABOPIA, COCAINSCRNUR, LABBENZ, AMPHETMU, THCU, LABBARB in the last 168 hours.  ?Alcohol Level No results for input(s): ETH in the last 168 hours. ? ?IMAGING past 24 hours ?EEG adult ? ?Result Date: 04/14/2021 ?Mariah Havens, MD     04/14/2021 11:54 AM Patient Name: Mariah Henderson MRN: [id number] Epilepsy Attending: Lora Henderson Referring Physician/Provider: Derek Jack, MD Date: 04/14/2021 Duration: 22.51 mins Patient history: 71yo F with ams. EEG to evaluate for seizure. Level of alertness: Awake AEDs during EEG study:  None Technical aspects: This EEG study was done with scalp electrodes positioned according to the 10-20 International system of electrode placement. Electrical activity was acquired at a sampling rate of '500Hz'$  and reviewed with a high frequency filter of '70Hz'$  and a low frequency filter of '1Hz'$ . EEG data were recorded continuously and digitally stored. Description: No clear posterior dominant rhythm was seen. EEG showed continuous generalized 6-'9Hz'$  theta activity admixed with 2-'3Hz'$  delta slowing. Hyperventilation and photic stimulation were not performed.   ABNORMALITY - Continuous slow, generalized MPRESSION: This study is suggestive of moderate diffuse encephalopathy, nonspecific etiology. No seizures or epileptiform discharges were seen throughout the recording. Mariah Henderson   ? ?PHYSICAL EXAM ? ?,  ?Physical Exam  ?Constitutional: Elderly African-American lady not in distress ?Respiratory: Effort normal, non-labored breathing ? ?Neuro: ?Mental Status: ?Patient is alert and interactive but with delayed responses but is able to state place, month and year.  Diminished attention registration and recall ? ?MOCA 3 ? ? ?Cranial Nerves: ?II: Pupils are equal, round, and reactive to light. ?III,IV, VI: EOMI without ptosis or diploplia.  ?  VII: Facial movement is symmetric resting and smiling ?VIII: Hearing is intact to voice ?XI: Shoulder shrug is symmetric. ?XII: Tongue protrudes midline without atrophy or fasciculations.  ?Motor: ?Tone is normal. Bulk is normal. Moves all extremities to command with 4/5 strength throughout ? ? ? ?ASSESSMENT/PLAN ?[name] is a 71 y.o. female with history of pancreatic cancer with metastasis to liver, HTN, HLD, DM, COPD, anxiety/depression presenting after being found unresponsive and with right-sided weakness. ? ?No stroke.  Toxic metabolic encephalopathy possibly related to new cancer versus pain medications.  Appears improved today ?Code Stroke CT Head Possible acute  infarct right pons. Correlate with symptoms and MRI. No acute hemorrhage ?CTA head & neck negative for large vessel occlusion or other emergent finding ?MRI no acute intracranial abnormality ?2D Echo EF 65-70%, LV normal function ?LDL No results found for requested labs within last 26280 hours. ?HgbA1c 6.1 ?VTE prophylaxis -Lovenox ?   ?Diet  ? DIET DYS 3 Room service appropriate? Yes; Fluid consistency: Thin  ? ?EEG suggestive of moderate diffuse encephalopathy ?Xarelto (rivaroxaban) daily prior to admission, which was resumed ?Therapy recommendations:  Skilled nursing-short term rehab (<3 hours/day) (vs HHPT with increased family support, pt states she lives at home alone and unable to verify this with family) Skilled nursing-short term rehab (<3 hours/day) No SLP follow up  ?Disposition:  SNF ? ?Hypertension ?Home meds: benazepril '20mg'$  daily, held given normotensive ?Stable ?Long-term BP goal normotensive, avoid hypotension ? ?Hyperlipidemia ?Home meds: Atorvastatin 40 mg, resumed in hospital ?LDL No results found for requested labs within last 26280 hours., goal < 70 ?High intensity statin not indicated  ?Continue statin at discharge ? ?Other Stroke Risk Factors ?Advanced Age >/= 2  ?Coronary artery disease ? ?Other Active Problems ?Goals of care - med with outpatient palliative prior to hospitalization.  ?Metastatic pancreatic cancer with mets to the liver and lungs ?Home meds: Olaparib, holding ?Recent weight loss 25 -30 pounds ?Pain: celebrex, morphine ?Neuropathy of right hand - gabapentin ? ?Hospital day # 0 ? ?Signed: ?Mariah Henderson , Mariah Henderson, Mariah Henderson ?Mariah Henderson ?See Amion for schedule and pager information ?04/15/2021 11:39 AM ? I have personally obtained history,examined this patient, reviewed notes, independently viewed imaging studies, participated in medical decision making and plan of care.ROS completed by me personally and pertinent positives fully documented  I have made any  additions or clarifications directly to the above note. Agree with note above.  Patient appears to be is improving slowly.  Continue ongoing management.  Family meeting with palliative care later today to decide on goals of care.  Stroke team will sign off.  Kindly call for questions. ? ?Antony Contras, MD ?Medical Director ?[facility] ?Pager: [phone] ?04/15/2021 12:50 PM ? ? ?To contact Stroke Continuity provider, please refer to [email or web address] ?After hours, contact General Neurology ? ?

## 2021-04-15 NOTE — Progress Notes (Signed)
PHARMACY - PHYSICIAN COMMUNICATION ?CRITICAL VALUE ALERT - BLOOD CULTURE IDENTIFICATION (BCID) ? ?Mariah Henderson is an 71 y.o. female who presented to [facility] on 04/13/2021 with a chief complaint of metabolic encephalopathy. ? ?Assessment:  Staphylococcus epidermidis grew in aerobic bottle, 1 of 4.  Methicillin resistance detected.  ? ?Name of physician (or Provider) Contacted:  ?Dr. Laural Golden ? ?Current antibiotics: none ? ?Changes to prescribed antibiotics recommended:  Possible blood culture contaminant (unless isolated from more than one blood culture draw or clinical case suggests pathogenicity.  ?Could possibly be a contaminant, but will repeat blood culture and start IV Vancomycin for now.  ? ?Results for orders placed or performed during the hospital encounter of 04/13/21  ?Blood Culture ID Panel (Reflexed) (Collected: 04/13/2021  8:55 PM)  ?Result Value Ref Range  ? Enterococcus faecalis NOT DETECTED NOT DETECTED  ? Enterococcus Faecium NOT DETECTED NOT DETECTED  ? Listeria monocytogenes NOT DETECTED NOT DETECTED  ? Staphylococcus species DETECTED (A) NOT DETECTED  ? Staphylococcus aureus (BCID) NOT DETECTED NOT DETECTED  ? Staphylococcus epidermidis DETECTED (A) NOT DETECTED  ? Staphylococcus lugdunensis NOT DETECTED NOT DETECTED  ? Streptococcus species NOT DETECTED NOT DETECTED  ? Streptococcus agalactiae NOT DETECTED NOT DETECTED  ? Streptococcus pneumoniae NOT DETECTED NOT DETECTED  ? Streptococcus pyogenes NOT DETECTED NOT DETECTED  ? A.calcoaceticus-baumannii NOT DETECTED NOT DETECTED  ? Bacteroides fragilis NOT DETECTED NOT DETECTED  ? Enterobacterales NOT DETECTED NOT DETECTED  ? Enterobacter cloacae complex NOT DETECTED NOT DETECTED  ? Escherichia coli NOT DETECTED NOT DETECTED  ? Klebsiella aerogenes NOT DETECTED NOT DETECTED  ? Klebsiella oxytoca NOT DETECTED NOT DETECTED  ? Klebsiella pneumoniae NOT DETECTED NOT DETECTED  ? Proteus species NOT DETECTED NOT DETECTED  ? Salmonella species NOT  DETECTED NOT DETECTED  ? Serratia marcescens NOT DETECTED NOT DETECTED  ? Haemophilus influenzae NOT DETECTED NOT DETECTED  ? Neisseria meningitidis NOT DETECTED NOT DETECTED  ? Pseudomonas aeruginosa NOT DETECTED NOT DETECTED  ? Stenotrophomonas maltophilia NOT DETECTED NOT DETECTED  ? Candida albicans NOT DETECTED NOT DETECTED  ? Candida auris NOT DETECTED NOT DETECTED  ? Candida glabrata NOT DETECTED NOT DETECTED  ? Candida krusei NOT DETECTED NOT DETECTED  ? Candida parapsilosis NOT DETECTED NOT DETECTED  ? Candida tropicalis NOT DETECTED NOT DETECTED  ? Cryptococcus neoformans/gattii NOT DETECTED NOT DETECTED  ? Methicillin resistance mecA/C DETECTED (A) NOT DETECTED  ? ? ?Nicole Cella, RPh ?Clinical Pharmacist ?04/15/2021  2:50 AM ? ?

## 2021-04-15 NOTE — TOC Progression Note (Addendum)
Transition of Care (TOC) - Progression Note  ? ? ?Patient Details  ?Name: Mariah Henderson ?MRN: [id number] ?Date of Birth: [birth year] ? ?Transition of Care (TOC) CM/SW Contact  ?Pollie Friar, RN ?Phone Number: ?04/15/2021, 3:54 PM ? ?Clinical Narrative:    ?CM provided bed offers to patient and her sister Mariah Henderson. They do not like the offers she has received and would like [facility]. [facility] has denied but Mariah Henderson says she knows someone at [facility] and is waiting to talk with her. Her back up would be [facility]. CM has messaged Star at [facility] to see if they can offer a bed. No response as of yet.  ?TOC will need to f/u with sister, Mariah Henderson in the am and Star at [facility].  ?Mariah Henderson cell # [phone] ? ?Expected Discharge Plan: [place] ?Barriers to Discharge: Continued Medical Work up ? ?Expected Discharge Plan and Services ?Expected Discharge Plan: [place] ?In-house Referral: Clinical Social Work ?Discharge Planning Services: CM Consult ?Post Acute Care Choice: [place] ?Living arrangements for the past 2 months: Apartment ?                ?  ?  ?  ?  ?  ?  ?  ?  ?  ?  ? ? ?Social Determinants of Health (SDOH) Interventions ?  ? ?Readmission Risk Interventions ?   ? View : No data to display.  ?  ?  ?  ? ? ?

## 2021-04-15 NOTE — Progress Notes (Addendum)
Pharmacy Antibiotic Note ? ?Mariah Henderson is a 71 y.o. female admitted on 04/25/7351 with metabolic encephalopathy.   ?BCID: Staphylococcus epidermidis grew in aerobic bottle, 1 of 4 .  Methicillin resistance detected.  ?Could be a contaminant but will repeat blood cultures and start IV vancomycin for now. ?Pharmacy has been consulted for Vancomycin dosing. ? ?Plan: ?Vancomycin 1000 mg  x 1 then 750 mg IV q24hr.  ?Estimated AUC 483.7,  used Scr 0.8 ?Monitor clinical progress, renal function. Check steady state vancomycin levels per protocol if needed.  ? ? ?Height: 5' (152.4 cm) ?Weight: 51.6 kg (113 lb 12.1 oz) ?IBW/kg (Calculated) : 45.5 ? ?Temp (24hrs), Avg:98.1 ?F (36.7 ?C), Min:97.9 ?F (36.6 ?C), Max:98.4 ?F (36.9 ?C) ? ?Recent Labs  ?Lab 04/13/21 ?1327 04/13/21 ?1332 04/14/21 ?0451  ?WBC 15.6*  --  16.3*  ?CREATININE 0.70 0.60 0.61  ?  ?Estimated Creatinine Clearance: 45 mL/min (by C-G formula based on SCr of 0.61 mg/dL).   ? ?No Known Allergies ? ?Antimicrobials this admission: ?Vancomycin 3/31>> ? ?Dose adjustments this admission: n/a ? ? ?Microbiology results: ?3/29 BCx:  1 of 4: staph epidermidis,  meth resistance detected ? 3 of 4: no growth detected <24 hrs ? ? ?Thank you for allowing pharmacy to be a part of this patient?s care. ? ?Nicole Cella, RPh ?Clinical Pharmacist ?Please check AMION for all [facility] Pharmacy phone numbers ?After 10:00 PM, call [place] [phone] ?04/15/2021 2:33 AM ? ?

## 2021-04-15 NOTE — Progress Notes (Incomplete)
STROKE TEAM PROGRESS NOTE  ? ?INTERVAL HISTORY ?"3/29 her niece spoke to her at 85 AM and their conversation was normal, but toward the end of her conversations, her speech seemed "crazy". [name], who lives with her, called later that morning and reported that she was not making sense and was talking "out of her head". Her speech just didn't make sense, although the speech was clear.  Because of this, her sister, [name], went to check in and found her unresponsive in bed.  ?  ?Her sister reports that the only thing that changed in [place] routine was a new cancer medication, Lonie Peak, that she started 3/28.  She has taken 2 doses. She took all of her usual medications this morning, except for morphine. She was diagnosed January 2022 with pancreatic cancer, with subsequent mets to liver and lung. Chemotherapy did not seem to be working for liver and lung nodules, so she switched to oral medication. She has right-sided abdominal pain due to liver mets. In addition, she has had loss of appetite and difficulty swallowing solid foods. She has lost 25-30 lbs in the past month." ? ?No family at bedside. Patient was awake, alert, pleasantly demented and unable to participate in evaluation. Patient has poor attention/concentration and perseverates on previous answers.  ?When asked to name animals on 4 legs she continued counting fingers. Cannot follow 2 step commands, only simple midline commands.  ?Clock drawing- all numbers in right upper quadrant- visual spacial impairment. Unable to follow directions to draw intersecting squares after the drawing the clock. Could not complete MOCA 3. ?WBS 16.3, urine clear ? ? ?Vitals:  ? 04/14/21 2100 04/14/21 2339 04/15/21 0344 04/15/21 0740  ?BP:  112/74 111/74 108/74  ?Pulse:  100 (!) 102 99  ?Resp:  '18 18 18  '$ ?Temp:  98 ?F (36.7 ?C) 98.2 ?F (36.8 ?C) 98 ?F (36.7 ?C)  ?TempSrc:  Oral Oral Oral  ?SpO2: 95% 100% 95% 96%  ?Weight:      ?Height:      ? ?CBC:  ?Recent Labs  ?Lab  04/13/21 ?1327 04/13/21 ?1332 04/14/21 ?0451  ?WBC 15.6*  --  16.3*  ?NEUTROABS 15.0*  --   --   ?HGB 11.6* 13.6 11.4*  ?HCT 39.4 40.0 38.8  ?MCV 80.7  --  80.8  ?PLT 203  --  155  ? ? ?Basic Metabolic Panel:  ?Recent Labs  ?Lab 04/13/21 ?1327 04/13/21 ?1332 04/13/21 ?1645 04/14/21 ?0451  ?NA 135 135  --  138  ?K 4.5 4.9  --  4.3  ?CL 97* 96*  --  98  ?CO2 30  --   --  33*  ?GLUCOSE 187* 190*  --  123*  ?BUN 14 18  --  12  ?CREATININE 0.70 0.60  --  0.61  ?CALCIUM 9.8  --   --  9.8  ?MG  --   --  1.8  --   ? ? ?Lipid Panel: No results for input(s): CHOL, TRIG, HDL, CHOLHDL, VLDL, LDLCALC in the last 168 hours. ?HgbA1c: No results for input(s): HGBA1C in the last 168 hours. ?Urine Drug Screen: No results for input(s): LABOPIA, COCAINSCRNUR, LABBENZ, AMPHETMU, THCU, LABBARB in the last 168 hours.  ?Alcohol Level No results for input(s): ETH in the last 168 hours. ? ?IMAGING past 24 hours ?EEG adult ? ?Result Date: 04/14/2021 ?Lora Havens, MD     04/14/2021 11:54 AM Patient Name: Mariah Henderson MRN: [id number] Epilepsy Attending: Lora Havens Referring Physician/Provider: Derek Jack, MD  Date: 04/14/2021 Duration: 22.51 mins Patient history: 71yo F with ams. EEG to evaluate for seizure. Level of alertness: Awake AEDs during EEG study: None Technical aspects: This EEG study was done with scalp electrodes positioned according to the 10-20 International system of electrode placement. Electrical activity was acquired at a sampling rate of '500Hz'$  and reviewed with a high frequency filter of '70Hz'$  and a low frequency filter of '1Hz'$ . EEG data were recorded continuously and digitally stored. Description: No clear posterior dominant rhythm was seen. EEG showed continuous generalized 6-'9Hz'$  theta activity admixed with 2-'3Hz'$  delta slowing. Hyperventilation and photic stimulation were not performed.   ABNORMALITY - Continuous slow, generalized MPRESSION: This study is suggestive of moderate diffuse encephalopathy,  nonspecific etiology. No seizures or epileptiform discharges were seen throughout the recording. [name]   ? ?PHYSICAL EXAM ? ?,  ?Physical Exam  ?Constitutional: Elderly African-American lady not in distress ?Cardiovascular: Normal rate and regular rhythm.  ?Respiratory: Effort normal, non-labored breathing ? ?Neuro: ?Mental Status: ?Patient is awake, alert, Disoriented, can only follow simple midline commands.  Unable to follow two-step commands.  Diminished attention, registration and recall.  Visual spatial impairment .  Perseverative. Poor concentration/attention.  ? ?MOCA 3 ? ? ?Cranial Nerves: ?II: Visual Fields are full. Pupils are equal, round, and reactive to light. ?III,IV, VI: EOMI without ptosis or diploplia.  ?V: Facial sensation is symmetric to temperature ?VII: Facial movement is symmetric resting and smiling ?VIII: Hearing is intact to voice ?X: Palate elevates symmetrically ?XI: Shoulder shrug is symmetric. ?XII: Tongue protrudes midline without atrophy or fasciculations.  ?Motor: ?Tone is normal. Bulk is normal. right arm weakness 3/5, chronic ?Sensory: ?Sensation is symmetric to light touch and temperature in the arms and legs. No extinction to DSS present.  ? ? ?ASSESSMENT/PLAN ?Mariah Henderson is a 71 y.o. female with history of pancreatic cancer with metastasis to liver, HTN, HLD, DM, COPD, anxiety/depression presenting after being found unresponsive and with right-sided weakness. ? ?No stroke.  Toxic metabolic encephalopathy possibly related to new cancer medication. ?Code Stroke CT Head Possible acute infarct right pons. Correlate with symptoms and MRI. No acute hemorrhage ?CTA head & neck negative for large vessel occlusion or other emergent finding ?MRI no acute intracranial abnormality ?2D Echo EF 65-70%, LV normal function ?LDL No results found for requested labs within last 26280 hours. ?HgbA1c 6.1 ?VTE prophylaxis -Lovenox ?   ?Diet  ? DIET DYS 3 Room service appropriate?  Yes; Fluid consistency: Thin  ? ?EEG suggestive of moderate diffuse encephalopathy ?Xarelto (rivaroxaban) daily prior to admission, which was resumed ?Therapy recommendations:  Skilled nursing-short term rehab (<3 hours/day) (vs HHPT with increased family support, pt states she lives at home alone and unable to verify this with family) Skilled nursing-short term rehab (<3 hours/day) No SLP follow up  ?Disposition:  SNF ? ?Hypertension ?Home meds: benazepril '20mg'$  daily, held given normotensive ?Stable ?Long-term BP goal normotensive, avoid hypotension ? ?Hyperlipidemia ?Home meds: Atorvastatin 40 mg, resumed in hospital ?LDL No results found for requested labs within last 26280 hours., goal < 70 ?High intensity statin not indicated  ?Continue statin at discharge ? ?Other Stroke Risk Factors ?Advanced Age >/= 2  ?Coronary artery disease ? ?Other Active Problems ?Goals of care - med with outpatient palliative prior to hospitalization.  ?Metastatic pancreatic cancer with mets to the liver and lungs ?Home meds: Olaparib, holding ?Recent weight loss 25 -30 pounds ?Pain: celebrex, morphine ?Neuropathy of right hand - gabapentin ? ?Hospital day # 0 ? ?  Signed: ?Merrily Brittle, DO ?Psychiatry Resident, PGY-1 ?[place] ?04/15/2021, 8:01 AM  ?STROKE MD NOTE :  ?I have personally obtained history,examined this patient, reviewed notes, independently viewed imaging studies, participated in medical decision making and plan of care.ROS completed by me personally and pertinent positives fully documented  I have made any additions or clarifications directly to the above note. Agree with note above.  Patient presented with sudden onset of altered mental status and confusion disorientation without any definite focal symptoms suggestive of stroke and brain imaging is negative for stroke.  EEG is suggestive of diffuse slowing suggestive of toxic metabolic etiology.  Recommend hold Lonie Peak which seems to be the only new  medication started which may contribute also limit pain narcotic medications which could also cause confusion.  Also hold gabapentin.  Follow elevated white count or any brewing infection.  Resume Xarelto for stroke p

## 2021-04-15 NOTE — Progress Notes (Signed)
Initial Nutrition Assessment ? ?DOCUMENTATION CODES:  ? ?Severe malnutrition in context of chronic illness ? ?INTERVENTION:  ? ?Ensure Enlive po TID, each supplement provides 350 kcal and 20 grams of protein. ? ?MVI with minerals daily ? ?NUTRITION DIAGNOSIS:  ? ?Severe Malnutrition related to chronic illness (metastatic pancreatic cancer) as evidenced by severe muscle depletion, severe fat depletion, percent weight loss (16% weight loss x 3 months). ? ?GOAL:  ? ?Patient will meet greater than or equal to 90% of their needs ? ?MONITOR:  ? ?PO intake, Supplement acceptance ? ?REASON FOR ASSESSMENT:  ? ?Consult ?Poor PO ? ?ASSESSMENT:  ? ?71 yo female admitted with acute encephalopathy. PMH includes pancreatic cancer with mets to liver and lungs, HTN, HLD, DM-2, GERD, COPD, foot drop. ? ?Spoke with patient and a female family member at bedside. They report that patient has been eating poorly d/t poor appetite for the past several months and has lost a lot of weight. Patient C/O early satiety.  ? ?Labs reviewed. Na 133 ?Medications reviewed and include Miralax. ? ?Weight history reviewed. Weight has recently been trending down. 16% weight loss within the past 3 months is severe.  ? ?NUTRITION - FOCUSED PHYSICAL EXAM: ? ?Flowsheet Row Most Recent Value  ?Orbital Region Moderate depletion  ?Upper Arm Region Moderate depletion  ?Thoracic and Lumbar Region Severe depletion  ?Buccal Region Severe depletion  ?Temple Region Severe depletion  ?Clavicle Bone Region Severe depletion  ?Clavicle and Acromion Bone Region Severe depletion  ?Scapular Bone Region Severe depletion  ?Dorsal Hand Moderate depletion  ?Patellar Region Moderate depletion  ?Anterior Thigh Region Moderate depletion  ?Posterior Calf Region Severe depletion  ?Edema (RD Assessment) None  ?Hair Reviewed  ?Eyes Reviewed  ?Mouth Reviewed  ?Skin Reviewed  ?Nails Reviewed  ? ?  ? ? ?Diet Order:   ?Diet Order   ? ?       ?  DIET DYS 3 Room service appropriate? Yes;  Fluid consistency: Thin  Diet effective now       ?  ? ?  ?  ? ?  ? ? ?EDUCATION NEEDS:  ? ?Not appropriate for education at this time ? ?Skin:  Skin Assessment: Skin Integrity Issues: ?Skin Integrity Issues:: Unstageable ?Unstageable: L hip ? ?Last BM:  PTA ? ?Height:  ? ?Ht Readings from Last 1 Encounters:  ?04/13/21 5' (1.524 m)  ? ? ?Weight:  ? ?Wt Readings from Last 1 Encounters:  ?04/13/21 51.6 kg  ? ? ?BMI:  Body mass index is 22.22 kg/m?. ? ?Estimated Nutritional Needs:  ? ?Kcal:  1600-1800 ? ?Protein:  75-90 gm ? ?Fluid:  1.6-1.8 L ? ? ? ?Lucas Mallow RD, LDN, CNSC ?Please refer to Amion for contact information.                                                       ? ?

## 2021-04-15 NOTE — Progress Notes (Signed)
Paged at 2:12 AM by pharmacy regarding positive blood cultures in 1 of 4 bottles with presence of MRSA and Staphylococcus epidermidis.  After reviewing patient's chart, there was a concern for infectious etiology given patient's leukocytosis and presentation of altered mental status.  Given this information, we will repeat blood cultures and then initiate IV vancomycin and adjust based on culture growth. ? ?Idamae Schuller, MD ?[facility] ?Internal Medicine Residency, PGY-1  ?

## 2021-04-15 NOTE — Progress Notes (Signed)
Physical Therapy Treatment ?Patient Details ?Name: Mariah Henderson ?MRN: [id number] ?DOB: 05/19/1950 ?Today's Date: 04/15/2021 ? ? ?History of Present Illness Pt. is a 71 y.o. F. presenting to [facility] on 3/29 after being found unresponsive at home. Only change in her routine was taking a new cancer medication Lonie Peak) and not taking her morning morphine. CT shows possible R pons infarct and MRI shows no acute infarct, both show age related cerebral atrophy. CXR shows Mixed interstitial and airspace opacities bilaterally, likely a  combination pulmonary edema and underlying metastatic disease. Pt also has an unstagable pressure wound on her L hip. 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 ? ?  ?PT Comments  ? ? Patient progressing slowly towards PT goals. Pt appears to be weaker this session and requiring more assist for transfers and gait training today. Very slow gait speed putting pt at increased risk for falls. HR up to 137 bpm and Sp02 dropped to 80% on RA with activity. Dizziness reported post walk but not during activity despite being asked. Donned 02 post session. Due to above, continue to recommend SNF to maximize independence and mobility prior to return home. Will follow. ?  ?Recommendations for follow up therapy are one component of a multi-disciplinary discharge planning process, led by the attending physician.  Recommendations may be updated based on patient status, additional functional criteria and insurance authorization. ? ?Follow Up Recommendations ? Skilled nursing-short term rehab (<3 hours/day) ?  ?  ?Assistance Recommended at Discharge Frequent or constant Supervision/Assistance  ?Patient can return home with the following A little help with walking and/or transfers;A little help with bathing/dressing/bathroom;Direct supervision/assist for medications management;Assist for transportation;Assistance with cooking/housework ?   ?Equipment Recommendations ? None recommended by PT  ?  ?Recommendations for Other Services   ? ? ?  ?Precautions / Restrictions Precautions ?Precautions: Fall;Other (comment) ?Precaution Comments: watch 02/HR ?Restrictions ?Weight Bearing Restrictions: No  ?  ? ?Mobility ? Bed Mobility ?Overal bed mobility: Needs Assistance ?Bed Mobility: Supine to Sit ?  ?  ?Supine to sit: Mod assist, HOB elevated ?  ?  ?General bed mobility comments: Step by step cueing to reach for rail and get to EOB, increased time. ?  ? ?Transfers ?Overall transfer level: Needs assistance ?Equipment used: Rolling walker (2 wheels) ?Transfers: Sit to/from Stand ?Sit to Stand: Mod assist, From elevated surface ?  ?  ?  ?  ?  ?General transfer comment: Mod A to power to standing with cues for hand placement, pt wanting to pull up on RW. Posterior bias. ?  ? ?Ambulation/Gait ?Ambulation/Gait assistance: Min assist ?Gait Distance (Feet): 60 Feet ?Assistive device: Rolling walker (2 wheels) ?Gait Pattern/deviations: Step-through pattern, Decreased stride length, Trunk flexed, Narrow base of support ?Gait velocity: .77 ft/sec ?Gait velocity interpretation: <1.8 ft/sec, indicate of risk for recurrent falls ?  ?General Gait Details: Slow, unsteady gait with very short step lengths and assist needed to propel RW forward to increase speed. Difficulty with turns. IMpaired balance needing Min A at all times. HR up to 137 bpm. Sp02 dropped to 80% on RA. ? ? ?Stairs ?  ?  ?  ?  ?  ? ? ?Wheelchair Mobility ?  ? ?Modified Rankin (Stroke Patients Only) ?  ? ? ?  ?Balance Overall balance assessment: Needs assistance ?Sitting-balance support: Feet supported, No upper extremity supported ?Sitting balance-Leahy Scale: Fair ?  ?  ?Standing balance support: During functional activity, Reliant on assistive  device for balance ?Standing balance-Leahy Scale: Poor ?Standing balance comment: reliant on external support and UE support ?  ?  ?  ?  ?  ?  ?  ?  ?  ?  ?  ?   ? ?  ?Cognition Arousal/Alertness: Awake/alert ?Behavior During Therapy: [facility] for tasks assessed/performed ?Overall Cognitive Status: Impaired/Different from baseline ?Area of Impairment: Memory, Following commands, Safety/judgement ?  ?  ?  ?  ?  ?  ?  ?  ?Orientation Level: Disoriented to, Situation ?  ?Memory: Decreased short-term memory ?Following Commands: Follows one step commands with increased time ?Safety/Judgement: Decreased awareness of safety, Decreased awareness of deficits ?  ?Problem Solving: Slow processing, Decreased initiation, Difficulty sequencing, Requires verbal cues, Requires tactile cues ?General Comments: Needs repetition to follow commands. ?  ?  ? ?  ?Exercises   ? ?  ?General Comments General comments (skin integrity, edema, etc.): Twin sister present during session. HR up to 137 bpm and Sp02 dropped to 80% on RA with activity. Dizziness reported post walk but not during despite being asked. ?  ?  ? ?Pertinent Vitals/Pain Pain Assessment ?Pain Assessment: Faces ?Faces Pain Scale: No hurt  ? ? ?Home Living   ?  ?  ?  ?  ?  ?  ?  ?  ?  ?   ?  ?Prior Function    ?  ?  ?   ? ?PT Goals (current goals can now be found in the care plan section) Progress towards PT goals: Progressing toward goals (slowly) ? ?  ?Frequency ? ? ? Min 2X/week ? ? ? ?  ?PT Plan Current plan remains appropriate  ? ? ?Co-evaluation   ?  ?  ?  ?  ? ?  ?AM-PAC PT "6 Clicks" Mobility   ?Outcome Measure ? Help needed turning from your back to your side while in a flat bed without using bedrails?: A Little ?Help needed moving from lying on your back to sitting on the side of a flat bed without using bedrails?: A Lot ?Help needed moving to and from a bed to a chair (including a wheelchair)?: A Little ?Help needed standing up from a chair using your arms (e.g., wheelchair or bedside chair)?: A Lot ?Help needed to walk in hospital room?: A Little ?Help needed climbing 3-5 steps with a railing? : A Lot ?6 Click Score: 15 ? ?   ?End of Session Equipment Utilized During Treatment: Other (comment) (3L 02 placed at end of session) ?Activity Tolerance: Patient tolerated treatment well ?Patient left: in bed;with call bell/phone within reach;with bed alarm set;with family/visitor present (sitting EOB with sister present) ?Nurse Communication: Mobility status ?PT Visit Diagnosis: Other abnormalities of gait and mobility (R26.89) ?  ? ? ?Time: 8811-0315 ?PT Time Calculation (min) (ACUTE ONLY): 20 min ? ?Charges:  $Gait Training: 8-22 mins          ?          ? ?Marisa Severin, PT, DPT ?Acute Rehabilitation Services ?Pager [phone] ?Office [phone] ? ? ? ? ? ?[place] ?04/15/2021, 4:32 PM ? ?

## 2021-04-15 NOTE — Progress Notes (Signed)
PHARMACY - PHYSICIAN COMMUNICATION ?CRITICAL VALUE ALERT - BLOOD CULTURE IDENTIFICATION (BCID) ? ?Mariah Henderson is an 71 y.o. female who presented to [facility] on 04/13/2021 with a chief complaint of metabolic encephalopathy. ? ?Assessment:  Staphylococcus epidermidis grew in aerobic bottle, 1 of 4 .  ? ?Name of physician (or Provider) Contacted:  ?Dr. Laural Golden ? ?Current antibiotics: none ? ?Changes to prescribed antibiotics recommended:  Possible blood culture contaminant (unless isolated from more than one blood culture draw or clinical case suggests pathogenicity.  ?Could possibly be a contaminant, but will repeat blood culture and start IV Vancomycin for now.  ? ?Results for orders placed or performed during the hospital encounter of 04/13/21  ?Blood Culture ID Panel (Reflexed) (Collected: 04/13/2021  8:55 PM)  ?Result Value Ref Range  ? Enterococcus faecalis NOT DETECTED NOT DETECTED  ? Enterococcus Faecium NOT DETECTED NOT DETECTED  ? Listeria monocytogenes NOT DETECTED NOT DETECTED  ? Staphylococcus species DETECTED (A) NOT DETECTED  ? Staphylococcus aureus (BCID) NOT DETECTED NOT DETECTED  ? Staphylococcus epidermidis DETECTED (A) NOT DETECTED  ? Staphylococcus lugdunensis NOT DETECTED NOT DETECTED  ? Streptococcus species NOT DETECTED NOT DETECTED  ? Streptococcus agalactiae NOT DETECTED NOT DETECTED  ? Streptococcus pneumoniae NOT DETECTED NOT DETECTED  ? Streptococcus pyogenes NOT DETECTED NOT DETECTED  ? A.calcoaceticus-baumannii NOT DETECTED NOT DETECTED  ? Bacteroides fragilis NOT DETECTED NOT DETECTED  ? Enterobacterales NOT DETECTED NOT DETECTED  ? Enterobacter cloacae complex NOT DETECTED NOT DETECTED  ? Escherichia coli NOT DETECTED NOT DETECTED  ? Klebsiella aerogenes NOT DETECTED NOT DETECTED  ? Klebsiella oxytoca NOT DETECTED NOT DETECTED  ? Klebsiella pneumoniae NOT DETECTED NOT DETECTED  ? Proteus species NOT DETECTED NOT DETECTED  ? Salmonella species NOT DETECTED NOT DETECTED  ? Serratia  marcescens NOT DETECTED NOT DETECTED  ? Haemophilus influenzae NOT DETECTED NOT DETECTED  ? Neisseria meningitidis NOT DETECTED NOT DETECTED  ? Pseudomonas aeruginosa NOT DETECTED NOT DETECTED  ? Stenotrophomonas maltophilia NOT DETECTED NOT DETECTED  ? Candida albicans NOT DETECTED NOT DETECTED  ? Candida auris NOT DETECTED NOT DETECTED  ? Candida glabrata NOT DETECTED NOT DETECTED  ? Candida krusei NOT DETECTED NOT DETECTED  ? Candida parapsilosis NOT DETECTED NOT DETECTED  ? Candida tropicalis NOT DETECTED NOT DETECTED  ? Cryptococcus neoformans/gattii NOT DETECTED NOT DETECTED  ? Methicillin resistance mecA/C DETECTED (A) NOT DETECTED  ? ? ?Nicole Cella, RPh ?Clinical Pharmacist ?04/15/2021  2:18 AM ? ?

## 2021-04-15 NOTE — Progress Notes (Signed)
?HD #2 ?Patient Summary: Stanislawa Gaffin is a 71 y.o. 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 ? ?Overnight Events: Paged by pharmacy for positive blood cultures in 1 of 4 bottles. Positive Methicillin resistant Staphylococcus epidermidis. Repeated blood cultures. Started on IV Vancomycin per pharmacy dosing.  ? ?Interim History: Patient seen and evaluated at bedside by team on rounds. She says that she feels good today and is better compared to yesterday. She has not been up and walking yet, but has been eating well. She says that her family is coming by later today to visit. She reports having some chills, but doesn't know onset. She denies shortness of breath, fever, and did not report abdominal pain. ? ?Objective: ?Vital signs in last 24 hours: ?Vitals:  ? 04/15/21 0344 04/15/21 0740 04/15/21 0850 04/15/21 1116  ?BP: 111/74 108/74  114/76  ?Pulse: (!) 102 99  93  ?Resp: '18 18  16  '$ ?Temp: 98.2 ?F (36.8 ?C) 98 ?F (36.7 ?C)  98.1 ?F (36.7 ?C)  ?TempSrc: Oral Oral  Oral  ?SpO2: 95% 96% 92% 96%  ?Weight:      ?Height:      ? ?SpO2: 96 % ?O2 Flow Rate (L/min): 2 L/min ?Weight change:  ? ?Intake/Output Summary (Last 24 hours) at 04/15/2021 1320 ?Last data filed at 04/15/2021 0455 ?Gross per 24 hour  ?Intake 60.73 ml  ?Output 300 ml  ?Net -239.27 ml  ? ?Physical Exam ?Constitutional: Chronically-ill appearing, cooperative, and pleasant. Laying comfortably in bed. No acute distress.  ?Head: Normocephalic, atraumatic. ?Eyes: EOMI, non-icteric sclera. ?Cardio: RRR, normal S1 and S2. No murmurs, rubs, or gallops. Pulses 2+. ?Pulm: Normal work of breathing on [employer]. Lungs clear to auscultation bilaterally. No wheezes appreciated. ?Abdomen: Soft, non-tender, non-distended. ?MSK: Moves all extremities equally. ?Skin: Warm and dry. ?Neurologic  exam: ?Mental status: Alert, awake, and oriented to self, place, but not time. She said that today was Wednesday, the month was February, and the year is 2022. ?Cranial Nerves: ?            II: PERRLA ?            III, IV, VI: Extra-occular motions intact bilaterally ?            V, VII: Face symmetric, sensation intact in all 3 divisions   ?            IX, X: palate rises symmetrically ?            XI: Shoulder shrug normal bilaterally   ?            XII: Tongue midline    ?Motor: Strength 5/5 on upper extremities and 4/5 on lower extremities, bulk muscle and tone are normal ?Psych: Normal affect and mood. ? ? ?Lab Results: ? ?  Latest Ref Rng & Units 04/15/2021  ?  8:09 AM 04/14/2021  ?  4:51 AM 04/13/2021  ?  1:32 PM  ?CBC  ?WBC 4.0 - 10.5 K/uL 19.5   16.3     ?Hemoglobin 12.0 - 15.0 g/dL 10.8   11.4   13.6    ?Hematocrit 36.0 - 46.0 % 36.2   38.8   40.0    ?Platelets 150 - 400 K/uL 170   155     ? ? ?  Latest Ref Rng &  Units 04/15/2021  ?  8:09 AM 04/14/2021  ?  4:51 AM 04/13/2021  ?  1:32 PM  ?CMP  ?Glucose 70 - 99 mg/dL 153   123   190    ?BUN 8 - 23 mg/dL '15   12   18    '$ ?Creatinine 0.44 - 1.00 mg/dL 0.72   0.61   0.60    ?Sodium 135 - 145 mmol/L 133   138   135    ?Potassium 3.5 - 5.1 mmol/L 4.5   4.3   4.9    ?Chloride 98 - 111 mmol/L 96   98   96    ?CO2 22 - 32 mmol/L 31   33     ?Calcium 8.9 - 10.3 mg/dL 10.0   9.8     ? ?Micro Results: ?Recent Results (from the past 240 hour(s))  ?Culture, blood (routine x 2)     Status: None (Preliminary result)  ? Collection Time: 04/13/21  4:45 PM  ? Specimen: BLOOD RIGHT HAND  ?Result Value Ref Range Status  ? Specimen Description BLOOD RIGHT HAND  Final  ? Special Requests   Final  ?  BOTTLES DRAWN AEROBIC AND ANAEROBIC Blood Culture adequate volume  ? Culture   Final  ?  NO GROWTH 2 DAYS ?Performed at [facility], [place] [address], [place], [place] [address] ?  ? Report Status PENDING  Incomplete  ?Culture, blood (routine x 2)     Status: Abnormal (Preliminary  result)  ? Collection Time: 04/13/21  8:55 PM  ? Specimen: BLOOD  ?Result Value Ref Range Status  ? Specimen Description BLOOD BLOOD LEFT ARM  Final  ? Special Requests   Final  ?  BOTTLES DRAWN AEROBIC AND ANAEROBIC Blood Culture adequate volume  ? Culture  Setup Time   Final  ?  GRAM POSITIVE COCCI ?AEROBIC BOTTLE ONLY ?CRITICAL RESULT CALLED TO, READ BACK BY AND VERIFIED WITH: R CLARK,PHARMD'@0145'$  04/15/21 [place] ?  ? Culture (A)  Final  ?  STAPHYLOCOCCUS EPIDERMIDIS ?THE SIGNIFICANCE OF ISOLATING THIS ORGANISM FROM A SINGLE SET OF BLOOD CULTURES WHEN MULTIPLE SETS ARE DRAWN IS UNCERTAIN. PLEASE NOTIFY THE MICROBIOLOGY DEPARTMENT WITHIN ONE WEEK IF SPECIATION AND SENSITIVITIES ARE REQUIRED. ?Performed at [facility], [place] [address] [place], [place] [address] ?  ? Report Status PENDING  Incomplete  ?Blood Culture ID Panel (Reflexed)     Status: Abnormal  ? Collection Time: 04/13/21  8:55 PM  ?Result Value Ref Range Status  ? Enterococcus faecalis NOT DETECTED NOT DETECTED Final  ? Enterococcus Faecium NOT DETECTED NOT DETECTED Final  ? Listeria monocytogenes NOT DETECTED NOT DETECTED Final  ? Staphylococcus species DETECTED (A) NOT DETECTED Final  ?  Comment: CRITICAL RESULT CALLED TO, READ BACK BY AND VERIFIED WITH: ?R CLARK,PHARMD'@0145'$  04/15/21 [place] ?  ? Staphylococcus aureus (BCID) NOT DETECTED NOT DETECTED Final  ? Staphylococcus epidermidis DETECTED (A) NOT DETECTED Final  ?  Comment: Methicillin (oxacillin) resistant coagulase negative staphylococcus. Possible blood culture contaminant (unless isolated from more than one blood culture draw or clinical case suggests pathogenicity). No antibiotic treatment is indicated for blood  ?culture contaminants. ?CRITICAL RESULT CALLED TO, READ BACK BY AND VERIFIED WITH: ?R CLARK,PHARMD'@0145'$  04/15/21 [place] ?  ? Staphylococcus lugdunensis NOT DETECTED NOT DETECTED Final  ? Streptococcus species NOT DETECTED NOT DETECTED Final  ? Streptococcus agalactiae NOT DETECTED NOT  DETECTED Final  ? Streptococcus pneumoniae NOT DETECTED NOT DETECTED Final  ? Streptococcus pyogenes NOT  DETECTED NOT DETECTED Final  ? A.calcoaceticus-baumannii NOT DETECTED NOT DETECTED Final  ? Bacteroides fragilis NOT DETECTED NOT DETECTED Final  ? Enterobacterales NOT DETECTED NOT DETECTED Final  ? Enterobacter cloacae complex NOT DETECTED NOT DETECTED Final  ? Escherichia coli NOT DETECTED NOT DETECTED Final  ? Klebsiella aerogenes NOT DETECTED NOT DETECTED Final  ? Klebsiella oxytoca NOT DETECTED NOT DETECTED Final  ? Klebsiella pneumoniae NOT DETECTED NOT DETECTED Final  ? Proteus species NOT DETECTED NOT DETECTED Final  ? Salmonella species NOT DETECTED NOT DETECTED Final  ? Serratia marcescens NOT DETECTED NOT DETECTED Final  ? Haemophilus influenzae NOT DETECTED NOT DETECTED Final  ? Neisseria meningitidis NOT DETECTED NOT DETECTED Final  ? Pseudomonas aeruginosa NOT DETECTED NOT DETECTED Final  ? Stenotrophomonas maltophilia NOT DETECTED NOT DETECTED Final  ? Candida albicans NOT DETECTED NOT DETECTED Final  ? Candida auris NOT DETECTED NOT DETECTED Final  ? Candida glabrata NOT DETECTED NOT DETECTED Final  ? Candida krusei NOT DETECTED NOT DETECTED Final  ? Candida parapsilosis NOT DETECTED NOT DETECTED Final  ? Candida tropicalis NOT DETECTED NOT DETECTED Final  ? Cryptococcus neoformans/gattii NOT DETECTED NOT DETECTED Final  ? Methicillin resistance mecA/C DETECTED (A) NOT DETECTED Final  ?  Comment: CRITICAL RESULT CALLED TO, READ BACK BY AND VERIFIED WITH: ?R CLARK,PHARMD'@0145'$  04/15/21 [place] ?Performed at [facility], [place] [address] [place], [place] [address] ?  ?Culture, blood (routine x 2)     Status: None (Preliminary result)  ? Collection Time: 04/15/21  3:24 AM  ? Specimen: BLOOD RIGHT HAND  ?Result Value Ref Range Status  ? Specimen Description BLOOD RIGHT HAND  Final  ? Special Requests AEROBIC BOTTLE ONLY Blood Culture adequate volume  Final  ? Culture   Final  ?  NO GROWTH < 12  HOURS ?Performed at [facility], [place] [address], [place], [place] [address] ?  ? Report Status PENDING  Incomplete  ?Culture, blood (routine x 2)     Status: None (Preliminary result)  ? Collection Time: 0

## 2021-04-16 DIAGNOSIS — E43 Unspecified severe protein-calorie malnutrition: Secondary | ICD-10-CM

## 2021-04-16 DIAGNOSIS — G9341 Metabolic encephalopathy: Secondary | ICD-10-CM | POA: Diagnosis not present

## 2021-04-16 DIAGNOSIS — C799 Secondary malignant neoplasm of unspecified site: Secondary | ICD-10-CM | POA: Diagnosis not present

## 2021-04-16 LAB — CBC
HCT: 38.6 % (ref 36.0–46.0)
Hemoglobin: 11.4 g/dL — ABNORMAL LOW (ref 12.0–15.0)
MCH: 24.1 pg — ABNORMAL LOW (ref 26.0–34.0)
MCHC: 29.5 g/dL — ABNORMAL LOW (ref 30.0–36.0)
MCV: 81.6 fL (ref 80.0–100.0)
Platelets: 150 10*3/uL (ref 150–400)
RBC: 4.73 MIL/uL (ref 3.87–5.11)
RDW: 26.7 % — ABNORMAL HIGH (ref 11.5–15.5)
WBC: 20.8 10*3/uL — ABNORMAL HIGH (ref 4.0–10.5)
nRBC: 0.1 % (ref 0.0–0.2)

## 2021-04-16 LAB — COMPREHENSIVE METABOLIC PANEL
ALT: 64 U/L — ABNORMAL HIGH (ref 0–44)
AST: 96 U/L — ABNORMAL HIGH (ref 15–41)
Albumin: 2 g/dL — ABNORMAL LOW (ref 3.5–5.0)
Alkaline Phosphatase: 210 U/L — ABNORMAL HIGH (ref 38–126)
Anion gap: 6 (ref 5–15)
BUN: 22 mg/dL (ref 8–23)
CO2: 33 mmol/L — ABNORMAL HIGH (ref 22–32)
Calcium: 10 mg/dL (ref 8.9–10.3)
Chloride: 94 mmol/L — ABNORMAL LOW (ref 98–111)
Creatinine, Ser: 0.82 mg/dL (ref 0.44–1.00)
GFR, Estimated: >60 mL/min (ref >60)
Glucose, Bld: 286 mg/dL — ABNORMAL HIGH (ref 70–99)
Potassium: 4.8 mmol/L (ref 3.5–5.1)
Sodium: 133 mmol/L — ABNORMAL LOW (ref 135–145)
Total Bilirubin: 1.7 mg/dL — ABNORMAL HIGH (ref 0.3–1.2)
Total Protein: 5.5 g/dL — ABNORMAL LOW (ref 6.5–8.1)

## 2021-04-16 LAB — CULTURE, BLOOD (ROUTINE X 2): Special Requests: ADEQUATE

## 2021-04-16 MED ORDER — CELECOXIB 100 MG PO CAPS
100.0000 mg | ORAL_CAPSULE | Freq: Two times a day (BID) | ORAL | Status: DC | PRN
  Filled 2021-04-16 (×2): qty 1

## 2021-04-16 MED ORDER — CHLORHEXIDINE GLUCONATE CLOTH 2 % EX PADS
6.0000 | MEDICATED_PAD | Freq: Every day | CUTANEOUS | Status: DC
  Administered 2021-04-16 – 2021-04-21 (×6): 6 via TOPICAL

## 2021-04-16 MED ORDER — SENNOSIDES-DOCUSATE SODIUM 8.6-50 MG PO TABS
1.0000 | ORAL_TABLET | Freq: Every day | ORAL | Status: DC
  Administered 2021-04-16 – 2021-04-19 (×3): 1 via ORAL
  Filled 2021-04-16 (×3): qty 1

## 2021-04-16 MED ORDER — WHITE PETROLATUM EX OINT
TOPICAL_OINTMENT | CUTANEOUS | Status: DC | PRN
  Filled 2021-04-16: qty 28.35

## 2021-04-16 MED ORDER — MORPHINE SULFATE ER 15 MG PO TBCR: 15.0000 mg | EXTENDED_RELEASE_TABLET | Freq: Two times a day (BID) | ORAL | Status: DC | PRN

## 2021-04-16 MED ORDER — SODIUM CHLORIDE 0.9% FLUSH: 10.0000 mL | INTRAVENOUS | Status: DC | PRN

## 2021-04-16 MED ORDER — SODIUM CHLORIDE 0.9% FLUSH
10.0000 mL | Freq: Two times a day (BID) | INTRAVENOUS | Status: DC
  Administered 2021-04-16 – 2021-04-20 (×9): 10 mL

## 2021-04-16 NOTE — Progress Notes (Incomplete)
This nurse spoke with pts son and sister re: pts home medications. Pt currently not taking ?

## 2021-04-16 NOTE — Progress Notes (Addendum)
?Daily Progress Note  ? ?Patient Name: Mariah Henderson       Date: 04/16/2021 ?DOB: 02/14/1950  Age: 71 y.o. MRN#: [id number] ?Attending Physician: Lucious Groves, DO ?Primary Care Physician: Sid Falcon, MD ?Admit Date: 04/13/2021 ?Length of Stay: 1 day ? ?Reason for Consultation/Follow-up: Establishing goals of care ? ?HPI/Patient Profile:  71 y.o. 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 Family felt patient's conversation was becoming abnormal and sister went to check on her, found unresponsive in the bed. Brought to the ER as Code Stroke. Recently started new medication (5th line treatment for pancreatic CA), noted confused. Brain imaging less suspicious for CVA. She was admitted on 3/79/4327 with Metabolic encephalopathy, Stage IV pancreatic CA (mets to liver and lungs, progressive), and others.  ?  ?PMT consulted for [facility] [place] discussions. Of note, has recently seen [name], NP in the [place] clinic on 04/06/21. Made adjustments to pain regimen for increasing pain likely due to liver tumor burden, medication adjustments for constipation, began discussions on [place] but no official decisions made that day. ? ?PMT consulted for [place] conversations. ? ?Subjective:  ? ?Subjective: ?Chart Reviewed. Updates received. Patient Assessed. Created space and opportunity for patient  and family to explore thoughts and feelings regarding current medical situation. ? ?Today's Discussion: Today I met with the patient, her son Mariea Clonts, her niece [name], and her sister Magdaline at the bedside for goals of care discussion/family meeting.  I was joined by third-year medical student Leanne Chang.  We had an extensive and open discussion about the patient's current clinical situation including metastatic pancreatic cancer with progressive mets to liver and  lung, the fact that she is on fifth line treatment, acute encephalopathy now apparently near baseline.  Family asked several good questions and proposed series on her changing mental status over the past several months related to pain medication dose adjustments.  They felt that she was becoming overly sedated and subsequent poor appetite, fatigue, weight loss.  When she saw the outpatient symptom management clinic with the cancer center and they adjusted her medications to extended release morphine oral tablets they feel that her grogginess was improved.  However, she developed confusion and they brought her to the hospital. ? ?We discussed that while pain medications can make the patient groggy, sleepy, and lose interest in things that she is eating that her situation and progressive decline over the past several months are likely multifactorial.  We discussed that cancer can also cause poor appetite, weight loss, fatigue.  While we will want to control her symptoms we will strive to prevent overmedication and sedation but except that she may continue to decline because of her disease process.  They verbalized understanding. ? ?They are happy that she is more awake today.  We reviewed the current disposition plan of discharge to short-term skilled nursing facility rehab to see if they can get her a little bit stronger.  They would like her to maintain her independence as long as possible and they celebrate the fact that up until recently she was even driving herself.  She has strong family support including her niece and her sister who check on her frequently/daily, and help take her to appointments.  We discussed a plan for follow-up with oncology in 1 to 2 weeks for further discussions and to see how she  is doing on a reduced dose oral cancer treatment.  I feel she would benefit from follow-up with the symptom management clinic at that time as well for ongoing goals and symptom evaluation. ? ?Overarching  sentiments of the family is that he do not want her to suffer.  They have a strong faith base and rely on this during this time.  We discussed the inevitable mortality of life and that at some time, we are all "called home".  In that instance they expressed that they would like to "get out of the way and allow her to pass peacefully."  We reviewed her previous decision of DNR and family is on board with DNR status, accepting that aggressive resuscitation efforts are unlikely to produce a good result.  We also discussed feeding tubes and whether the patient would want this if she were unable to eat or drink for herself.  The patient waited on this and stated that she would not want a feeding tube.  The patient further offered that "if it is my time then I would like to go peacefully, there is no point in prolonging things."  I feel this may have provided some relief to the family to know that she is of like mind with their general sentiments. ? ?We discussed that we will continue to hope and pray for the best on a daily basis, but if things do not go the way we want then we can have further discussions including the possibility of hospice services in the future.  I gave a brief explanation of the hospice philosophy and benefits they can offer.  We agreed that they would not want to make a significant decisions such as this before following up with oncology to see if there is any improvement offered by her oral nonchemotherapy cancer treatment medication. ? ?I provided contact information for the palliative medicine team.  Current disposition plans are working on excepting SNF facility for short-term rehab.  Patient may be here until Monday if no facility can take a weekend admission. ? ?I provided emotional general support through therapeutic listening, empathy, sharing of stories, laughter, and other techniques.  I answered all questions and addressed all concerns to the best of my ability. ? ? ?I DISCUSSED CERTAIN  ASPECTS OF A MOST form today. The patient and family outlined their wishes for the following treatment decisions: ? ?Cardiopulmonary Resuscitation: Do Not Attempt Resuscitation (DNR/No CPR)  ?Feeding Tube: No feeding tube  ? ? ? ?Review of Systems  ?Constitutional:   ?     "I'm doing ok" with no general complaints  ?Respiratory:  Negative for cough and shortness of breath.   ?Gastrointestinal:  Negative for abdominal pain.  ?Musculoskeletal:   ?     Did ambulate with PT yesterday  ? ?Objective:  ? ?Vital Signs:  ?BP 105/71 (BP Location: Left Arm)   Pulse 82   Temp 98.2 ?F (36.8 ?C)   Resp 16   Ht 5' (1.524 m)   Wt 51.6 kg   LMP 04/29/1966 (LMP Unknown)   SpO2 94%   BMI 22.22 kg/m?  ? ?Physical Exam: ?Physical Exam ?Vitals and nursing note reviewed.  ?Constitutional:   ?   General: She is not in acute distress. ?   Appearance: She is ill-appearing.  ?HENT:  ?   Head: Normocephalic and atraumatic.  ?Pulmonary:  ?   Effort: Pulmonary effort is normal. No respiratory distress.  ?Abdominal:  ?   General: Abdomen is flat.  ?Skin: ?  General: Skin is warm and dry.  ?Neurological:  ?   Mental Status: She is alert.  ?Psychiatric:     ?   Mood and Affect: Mood normal.     ?   Behavior: Behavior normal.  ? ? ?Palliative Assessment/Data: 50% ? ? ?Assessment & Plan:  ? ?Impression: ?Present on Admission: ? Metabolic encephalopathy ? ?71 year old female with stage IV pancreatic cancer with progressive mets to liver and lung, worsening of her disease burden despite oncology treatment and is currently on fifth line treatment (not chemotherapy oral medication).  She presented with confusion and work-up is ongoing as to the etiology, likely multifactorial given progressive disease, dehydration, elevated white count. Also with new positive blood cultures, planned start vancomycin.  EEG with global changes, nothing acute.  Brain imaging did not show acute stroke to explain her confusion.  She has begun goals of care  conversation with her family.  She did elect DNR yesterday and confirmed this as well as understanding of what this means.  She seems to have a firm grasp on her clinical situation, even though she is confused to the year.  Poor

## 2021-04-16 NOTE — Progress Notes (Signed)
? LOS: 1 day  ?Patient Summary: Mariah Henderson is a 71 y.o. 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 ? ?Overnight Events: No acute overnight events. ? ?Interim History: Patient seen and evaluated at bedside by team on rounds. She says that she is doing well. She says that her last bowel movement was on Tuesday. She reports eating well, but that she gets full easily. She denies any pain. She says that her family will be visiting her today.  ? ?Spoke with her sister Mariah Henderson who reports that Mariah Henderson was more drowsy yesterday afternoon. She also reports that Xarelto was started due to history of clotting in port-o-cath. ? ?Objective: ?Vital signs in last 24 hours: ?Vitals:  ? 04/16/21 0311 04/16/21 0731 04/16/21 0826 04/16/21 1125  ?BP: 103/69  105/71 109/62  ?Pulse: 86 92 82 82  ?Resp: '17 18 16 18  '$ ?Temp: 98 ?F (36.7 ?C)  98.2 ?F (36.8 ?C) 98.2 ?F (36.8 ?C)  ?TempSrc:      ?SpO2: 94%  94% 97%  ?Weight:      ?Height:      ? ?SpO2: 97 % ?O2 Flow Rate (L/min): 2 L/min ?Weight change:  ?No intake or output data in the 24 hours ending 04/16/21 1310 ?Physical Exam: ?Constitutional: Chronically-ill, pleasant. Laying comfortably in bed eating breakfast. No acute distress. ?Pulm: Normal work of breathing. ?Neuro: Alert and awake. ?Psych: Normal meed and affect. ? ?Lab Results: ? ?  Latest Ref Rng & Units 04/16/2021  ?  1:46 AM 04/15/2021  ?  8:09 AM 04/14/2021  ?  4:51 AM  ?CBC  ?WBC 4.0 - 10.5 K/uL 20.8   19.5   16.3    ?Hemoglobin 12.0 - 15.0 g/dL 11.4   10.8   11.4    ?Hematocrit 36.0 - 46.0 % 38.6   36.2   38.8    ?Platelets 150 - 400 K/uL 150   170   155    ? ? ?  Latest Ref Rng & Units 04/16/2021  ?  1:46 AM 04/15/2021  ?  8:09 AM 04/14/2021  ?  4:51 AM  ?CMP  ?Glucose 70 - 99 mg/dL 286   153   123    ?BUN 8 - 23 mg/dL '22   15   12     '$ ?Creatinine 0.44 - 1.00 mg/dL 0.82   0.72   0.61    ?Sodium 135 - 145 mmol/L 133   133   138    ?Potassium 3.5 - 5.1 mmol/L 4.8   4.5   4.3    ?Chloride 98 - 111 mmol/L 94   96   98    ?CO2 22 - 32 mmol/L 33   31   33    ?Calcium 8.9 - 10.3 mg/dL 10.0   10.0   9.8    ?Total Protein 6.5 - 8.1 g/dL 5.5      ?Total Bilirubin 0.3 - 1.2 mg/dL 1.7      ?Alkaline Phos 38 - 126 U/L 210      ?AST 15 - 41 U/L 96      ?ALT 0 - 44 U/L 64      ? ? ?Micro Results: ?Blood cultures NGTD ? ?Medications: ?Scheduled Meds: ? atorvastatin  40 mg Oral Daily  ? Chlorhexidine Gluconate Cloth  6 each Topical  Daily  ? feeding supplement  237 mL Oral TID BM  ? gabapentin  300 mg Oral BID  ? leptospermum manuka honey  1 application. Topical Daily  ? mometasone-formoterol  2 puff Inhalation BID  ? morphine  15 mg Oral Q12H  ? multivitamin with minerals  1 tablet Oral Daily  ? polyethylene glycol  17 g Oral Daily  ? rivaroxaban  20 mg Oral Q supper  ? sertraline  50 mg Oral Daily  ? sodium chloride flush  10-40 mL Intracatheter Q12H  ? ?Continuous Infusions: ?PRN Meds:.acetaminophen **OR** acetaminophen, albuterol, celecoxib, senna-docusate, sodium chloride flush, traZODone ?Assessment/Plan: ?Principal Problem: ?  Metabolic encephalopathy ?Active Problems: ?  Pressure injury of skin ?  Metastatic malignant neoplasm ([place]) ?  Protein-calorie malnutrition, severe ? ?#Positive blood culture, Staphylococcus epidermidis ?Repeat blood cultures show NGTD. Patient remains afebrile. WBC 20.8 today, but leukocytosis is likely due to malignancy.  ?  ?#Pancreatic cancer with liver and lung metastases, stage IV ?#Goals of care ?Family meeting today with son, sister, and niece. Current plan is to discharge to short-term rehab and continue low-dose olaparib after discharge. DNAR status confirmed and patient stated that she did not want a feeding tube. Working on [facility]. Awaiting SNF facility placement. Patient has multiple centrally-acting medications and has  drowsiness associated with medication use. Will change pain medications to as needed to prevent overmedication.  ?--Palliative care assisting with [place] conversations ?--Continue holding [place] with plan to restart at '100mg'$  BID after discharge ?--Close follow-up with Oncology after discharge ?--Change Celecoxib to PRN ? ?#Constipation ?Patient reports no BM since Tuesday. On chronic opioid pain medication. ?--Miralax daily ?--Senokot QHS ?  ?Diet: Dysphagia 3 ?VTE: Xarelto '20mg'$  daily ?IVF: None ?Code: DNAR ? ?Prior to Admission Living Arrangement: home ?Anticipated Discharge Location: SNF ?Barriers to Discharge: SNF placement ?Dispo: Anticipated discharge in approximately 1-2 day(s).  ? ?This is a Careers information officer Note.  The care of the patient was discussed with Dr. Jimmye Norman and the assessment and plan formulated with their assistance.  Please see their attached note for official documentation of the daily encounter. ? ?Nelva Nay, Medical Student ?04/16/2021, 1:10 PM ?Pager: [phone] ?After 5pm on weekdays and 1pm on weekends: On Call pager [phone] ? ? ?

## 2021-04-17 ENCOUNTER — Encounter: Payer: Self-pay | Admitting: Internal Medicine

## 2021-04-17 DIAGNOSIS — C7889 Secondary malignant neoplasm of other digestive organs: Secondary | ICD-10-CM

## 2021-04-17 DIAGNOSIS — E43 Unspecified severe protein-calorie malnutrition: Secondary | ICD-10-CM

## 2021-04-17 MED ORDER — LACTATED RINGERS IV BOLUS: 1000.0000 mL | Freq: Once | INTRAVENOUS | Status: DC

## 2021-04-17 MED ORDER — LACTATED RINGERS IV BOLUS
500.0000 mL | Freq: Once | INTRAVENOUS | Status: AC
  Administered 2021-04-17: 500 mL via INTRAVENOUS

## 2021-04-17 MED ORDER — ORAL CARE MOUTH RINSE
15.0000 mL | Freq: Two times a day (BID) | OROMUCOSAL | Status: DC
  Administered 2021-04-17 – 2021-04-20 (×6): 15 mL via OROMUCOSAL

## 2021-04-17 MED ORDER — SERTRALINE HCL 50 MG PO TABS
25.0000 mg | ORAL_TABLET | Freq: Every day | ORAL | Status: DC
  Administered 2021-04-18: 25 mg via ORAL
  Filled 2021-04-17: qty 1

## 2021-04-17 NOTE — Progress Notes (Addendum)
?Daily Progress Note  ? ?Patient Name: Mariah Henderson       Date: 04/19/2021 ?DOB: 09-17-1950  Age: 71 y.o. MRN#: [id number] ?Attending Physician: Mariah Killian, MD ?Primary Care Physician: Mariah Falcon, MD ?Admit Date: 04/13/2021 ?Length of Stay: 4 days ? ?Reason for Consultation/Follow-up: Establishing goals of care ? ?HPI/Patient Profile:  71 y.o. 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 Family felt patient's conversation was becoming abnormal and sister went to check on her, found unresponsive in the bed. Brought to the ER as Code Stroke. Recently started new medication (5th line treatment for pancreatic CA), noted confused. Brain imaging less suspicious for CVA. 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  ?  ?PMT consulted for Mariah Henderson discussions. Of note, has recently seen Mariah Lea, NP in the [facility] on 04/06/21. Made adjustments to pain regimen for increasing pain likely due to liver tumor burden, medication adjustments for constipation, began discussions on Mariah Henderson but no official decisions made that day. ? ?PMT consulted for Mariah Henderson conversations. ? ?Subjective:  ? ?Subjective: ?Chart Reviewed. Updates received. Patient Assessed. Created space and opportunity for patient  and family to explore thoughts and feelings regarding current medical situation. ? ?Today's Discussion: Today I met with the patient at the bedside. She denies any significant complaints today. Specifically no pain, N/V, dyspnea. She states they have been getting her up with PT. We reviewed the plan for possible SNF d/c for short term rehab and strengthening. Plan to follow-up with oncology in 1-2 weeks. Anticipate possible d/c on Monday (facility doesn't accept transfers  over the weekend). Patient and family on board with this plan. ? ?I DISCUSSED CERTAIN ASPECTS OF A MOST form on 4/1. The patient and family outlined their wishes for the following treatment decisions: ? ?Cardiopulmonary Resuscitation: Do Not Attempt Resuscitation (DNR/No CPR)  ?Feeding Tube: No feeding tube  ? ? ? ?Review of Systems  ?Constitutional:   ?     "I'm doing ok" with no general complaints  ?Respiratory:  Negative for cough and shortness of breath.   ?Gastrointestinal:  Negative for abdominal pain.  ?Musculoskeletal:   ?     Did ambulate with PT yesterday  ? ?Objective:  ? ?Vital Signs:  ?BP 134/90 (BP Location: Left Arm)   Pulse (!) 106   Temp 98 ?F (36.7 ?C) (Oral)   Resp 18   Ht 5' (1.524 m)   Wt 51.6 kg   LMP 04/29/1966 (LMP Unknown)   SpO2 96%   BMI 22.22 kg/m?  ? ?Physical Exam: ?Physical Exam ?Vitals and nursing note reviewed.  ?Constitutional:   ?   General: She is not in acute distress. ?   Appearance: She is ill-appearing.  ?HENT:  ?   Head: Normocephalic and atraumatic.  ?Pulmonary:  ?   Effort: Pulmonary effort is normal. No respiratory distress.  ?Abdominal:  ?   General: Abdomen is flat.  ?Skin: ?   General: Skin is warm and dry.  ?Neurological:  ?   Mental Status: She is alert.  ?Psychiatric:     ?   Mood and Affect: Mood normal.     ?   Behavior: Behavior normal.  ? ? ?Palliative Assessment/Data: 50% ? ? ?Assessment & Plan:  ? ?Impression: ?Present on Admission: ?  Metabolic encephalopathy ? ?71 year old female with stage IV pancreatic cancer with progressive mets to liver and lung, worsening of her disease burden despite oncology treatment and is currently on fifth line treatment (not chemotherapy oral medication).  She presented with confusion and work-up is ongoing as to the etiology, likely multifactorial given progressive disease, dehydration, elevated white count. Also with new positive blood cultures, planned start vancomycin.  EEG with global changes, nothing acute.  Brain  imaging did not show acute stroke to explain her confusion.  She has begun goals of care conversation with her family.  She did elect DNR yesterday and confirmed this as well as understanding of what this means.  She seems to have a firm grasp on her clinical situation, even though she is confused to the year.  Poor overall prognosis.  After family meeting confirmed DNR status and no desire for feeding tubes in the future.  Further goals of care discussions to be determined at/after oncology follow-up in 1 to 2 weeks. ? ?SUMMARY OF RECOMMENDATIONS   ?Remain DNR ?Anticipate d/c to SNF/[facility] in the next 48 hours ?The patient does not desire any feeding tubes and I would avoid offering ?The patient would be a candidate for hospice should this be the road that she elects to follow, pending her clinical progression ?Chaplain consult for spiritual care and HCPOA completion, HCPOA likely Monday at earliest ?Plan for outpatient follow-up in the oncology symptom management/palliative care clinic ?PMT will continue to follow ? ?Symptom Management:  ?Per primary team ? ?Code Status: DNR ? ?Prognosis: Unable to determine ? ?Discharge Planning: To Be Determined ? ?Discussed with: Patient, medical team, nursing team, PT team ? ?Billing based on: Time ? ?Billing based on MDM: High ? ?Problems Addressed: One acute or chronic illness or injury that poses a threat to life or bodily function ? ?Amount and/or Complexity of Data: Category 3:Discussion of management or test interpretation with external physician/other qualified health care professional/appropriate source (not separately reported) ? ?Risks: Decision not to resuscitate or to de-escalate care because of poor prognosis  ? ?Mariah Field, NP ?Palliative Medicine Team ? ?Team Phone # [phone] (Nights/Weekends) ? 09/14/2020, 8:17 AM  ? ?

## 2021-04-17 NOTE — Progress Notes (Signed)
? ?HD#2 ?SUBJECTIVE:  ?Patient Summary: Mariah Henderson is a 71 y.o. 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  ? ?Overnight Events: none ? ?Interim History: Patient assessed at bedside this AM.  She reports feeling well this morning. Her breakfast recently arrived and she is working on eating. She states that she had a bowel movement. ? ?OBJECTIVE:  ?Vital Signs: ?Vitals:  ? 04/17/21 0326 04/17/21 0743 04/17/21 0756 04/17/21 1137  ?BP: 119/70 128/75  117/88  ?Pulse: 85 85  97  ?Resp: '16 16  18  '$ ?Temp: 98 ?F (36.7 ?C) 98.2 ?F (36.8 ?C)  98.2 ?F (36.8 ?C)  ?TempSrc: Oral     ?SpO2: 95% 97% 98% 100%  ?Weight:      ?Height:      ? ?Supplemental O2: Nasal Cannula ?SpO2: 100 % ?O2 Flow Rate (L/min): 2 L/min ? ?Filed Weights  ? 04/13/21 1300 04/13/21 1715  ?Weight: 51.6 kg 51.6 kg  ? ? ? ?Intake/Output Summary (Last 24 hours) at 04/17/2021 1218 ?Last data filed at 04/16/2021 1428 ?Gross per 24 hour  ?Intake 10 ml  ?Output --  ?Net 10 ml  ? ?Net IO Since Admission: -529.27 mL [04/17/21 1218] ? ?Physical Exam: ?Constitutional: well-appearing, sitting in bed eating breakfast, in no acute distress ?HENT: normocephalic atraumatic, mucous membranes moist ?Eyes: conjunctiva non-erythematous ?Cardiovascular: regular rate rate and rhythm, no m/r/g ?Pulmonary/Chest: normal work of breathing on room air, lungs clear to auscultation bilaterally ?Abdominal: soft, non-tender, non-distended ?MSK: normal bulk and tone ?Neurological: alert & oriented x 3 ?Skin: warm and dry ?Psych: normal mood and affect ? ?Patient Lines/Drains/Airways Status   ? ? Active Line/Drains/Airways   ? ? Name Placement date Placement time Site Days  ? Implanted Port 02/25/20 Right Chest 02/25/20  1440  Chest  417  ? External Urinary Catheter 04/16/21  --  --  1  ? Incision  (Closed) 06/23/14 Breast Left 06/23/14  0913  -- 2490  ? Incision (Closed) 10/05/14 Neck Right 10/05/14  0938  -- 2386  ? Incision (Closed) 06/14/16 Leg Right 06/14/16  0959  -- 1768  ? Pressure Injury Hip Left Unstageable - Full thickness tissue loss in which the base of the injury is covered by slough (yellow, tan, gray, green or brown) and/or eschar (tan, brown or black) in the wound bed. --  --  -- --  ? ?  ?  ? ?  ? ? ?Pertinent Labs: ? ?  Latest Ref Rng & Units 04/16/2021  ?  1:46 AM 04/15/2021  ?  8:09 AM 04/14/2021  ?  4:51 AM  ?CBC  ?WBC 4.0 - 10.5 K/uL 20.8   19.5   16.3    ?Hemoglobin 12.0 - 15.0 g/dL 11.4   10.8   11.4    ?Hematocrit 36.0 - 46.0 % 38.6   36.2   38.8    ?Platelets 150 - 400 K/uL 150   170   155    ? ? ? ?  Latest Ref Rng & Units 04/16/2021  ?  1:46 AM 04/15/2021  ?  8:09 AM 04/14/2021  ?  4:51 AM  ?CMP  ?Glucose 70 - 99 mg/dL 286   153   123    ?BUN 8 - 23 mg/dL '22   15   12    '$ ?Creatinine 0.44 -  1.00 mg/dL 0.82   0.72   0.61    ?Sodium 135 - 145 mmol/L 133   133   138    ?Potassium 3.5 - 5.1 mmol/L 4.8   4.5   4.3    ?Chloride 98 - 111 mmol/L 94   96   98    ?CO2 22 - 32 mmol/L 33   31   33    ?Calcium 8.9 - 10.3 mg/dL 10.0   10.0   9.8    ?Total Protein 6.5 - 8.1 g/dL 5.5      ?Total Bilirubin 0.3 - 1.2 mg/dL 1.7      ?Alkaline Phos 38 - 126 U/L 210      ?AST 15 - 41 U/L 96      ?ALT 0 - 44 U/L 64      ? ? ?No results for input(s): GLUCAP in the last 72 hours.  ? ?Pertinent Imaging: ?No results found. ? ?ASSESSMENT/PLAN:  ?Assessment: ?Principal Problem: ?  Metabolic encephalopathy ?Active Problems: ?  Pressure injury of skin ?  Metastatic malignant neoplasm ([place]) ?  Protein-calorie malnutrition, severe ? ? ?Mariah Henderson is a 71 y.o. 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 Mentation improved after IVF  resuscitation. Palliative care meeting 4/1 with DNR confirmed and patient stated she would like to continue treatment at this time, but does not want feeding tube. ? ?Plan: ?#Pancreatic cancer with liver and lung metastases, stage IV ?#Goals of care ?Family meeting 4/1 held. DNR status confirmed and patient does not want feeding tube. She is waiting on SNF facility placement. Pain medication was changed to PRN.  ? ?--Continue holding [place] with plan to restart at '100mg'$  BID after discharge ?--Close follow-up with Oncology and palliative after discharge ?--Change Celecoxib to PRN ? ?#Positive blood culture, staphylococcus epidermidis ?Repeat blood cultures show NGTD. Patient remains afebrile. WBC 20.8 4/1, but leukocytosis is likely due to malignancy. ? ?-continue lab vacation as long as patient remains afebrile. ? ?#Constipation ?Patient reports small BM overnight.  ? ?-scheduled miralax and senna ? ?#Depression ?Patient has been taking sertraline since 2014. Her son states that she has not been receiving this medication at home because it is thought to make her sleepy. He does not want her to take sertraline and plans to go to her house today to double check that there is no sertraline there. ? ?-begin to titrate sertraline 50 mg to 25 mg. ? ? ?Best Practice: ?Diet: Regular diet ?IVF: none ?VTE: xarelto 20 mg ?Code: DNR ?AB: none ?Therapy Recs: SNF, DME: none ?Family Contact: son, at bedside. ?DISPO: Anticipated discharge tomorrow to Skilled nursing facility pending placement. ? ?Signature: ?Daleen Bo. Belinda Bringhurst, D.O.  ?Internal Medicine Resident, PGY-1 ?[facility]  ?Pager: [phone] ?12:18 PM, 04/17/2021  ? ?Please contact the on call pager after 5 pm and on weekends at [phone].  ?

## 2021-04-17 NOTE — TOC Progression Note (Signed)
Transition of Care (TOC) - Progression Note  ? ? ?Patient Details  ?Name: Mariah Henderson ?MRN: [id number] ?Date of Birth: [birth year] ? ?Transition of Care (TOC) CM/SW Contact  ?Amador Cunas, [place] ?Phone Number: ?04/17/2021, 12:46 PM ? ?Clinical Narrative:   No response from [place] with [facility] in [place] or by phone re ability to offer a bed. SW spoke with pt's sister "Mag" [phone] who confirmed they have received a list of available offers [facility] [place]) but prefer [facility] or [facility]. SW made sister aware of denial from [facility] and encouraged her to consider current offers. Weekday SW to f/u.  ? ?Wandra Feinstein, MSW, LCSW ?[phone] (coverage) ? ? ? ? ? ?Expected Discharge Plan: [place] ?Barriers to Discharge: Continued Medical Work up ? ?Expected Discharge Plan and Services ?Expected Discharge Plan: [place] ?In-house Referral: Clinical Social Work ?Discharge Planning Services: CM Consult ?Post Acute Care Choice: [place] ?Living arrangements for the past 2 months: Apartment ?                ?  ?  ?  ?  ?  ?  ?  ?  ?  ?  ? ? ?Social Determinants of Health (SDOH) Interventions ?  ? ?Readmission Risk Interventions ?   ? View : No data to display.  ?  ?  ?  ? ? ?

## 2021-04-17 NOTE — Progress Notes (Addendum)
Dr. Jeanice Lim, on-call for attending, paged via secure chat regarding pt's HR (110s-130s ST w/PVCs and PACs). Pt asymptomatic. Page returned and 12 Lead ordered and obtained confirming rhythm. MD notified of results. Will continue to monitor.  ? ?Addendum: Dr. Eulas Post, also on-call for attending, came to assess pt at bedside. 500cc LR bolus ordered. Will administer and continue to monitor. ?

## 2021-04-18 ENCOUNTER — Inpatient Hospital Stay: Payer: Medicare Other

## 2021-04-18 ENCOUNTER — Inpatient Hospital Stay: Payer: Medicare Other | Admitting: Hematology

## 2021-04-18 ENCOUNTER — Inpatient Hospital Stay: Payer: Medicare Other | Admitting: Nurse Practitioner

## 2021-04-18 DIAGNOSIS — G9341 Metabolic encephalopathy: Secondary | ICD-10-CM | POA: Diagnosis not present

## 2021-04-18 LAB — COMPREHENSIVE METABOLIC PANEL
ALT: 65 U/L — ABNORMAL HIGH (ref 0–44)
AST: 71 U/L — ABNORMAL HIGH (ref 15–41)
Albumin: 2 g/dL — ABNORMAL LOW (ref 3.5–5.0)
Alkaline Phosphatase: 260 U/L — ABNORMAL HIGH (ref 38–126)
Anion gap: 6 (ref 5–15)
BUN: 22 mg/dL (ref 8–23)
CO2: 30 mmol/L (ref 22–32)
Calcium: 9.8 mg/dL (ref 8.9–10.3)
Chloride: 93 mmol/L — ABNORMAL LOW (ref 98–111)
Creatinine, Ser: 0.72 mg/dL (ref 0.44–1.00)
GFR, Estimated: >60 mL/min (ref >60)
Glucose, Bld: 227 mg/dL — ABNORMAL HIGH (ref 70–99)
Potassium: 4.3 mmol/L (ref 3.5–5.1)
Sodium: 129 mmol/L — ABNORMAL LOW (ref 135–145)
Total Bilirubin: 1.8 mg/dL — ABNORMAL HIGH (ref 0.3–1.2)
Total Protein: 5.8 g/dL — ABNORMAL LOW (ref 6.5–8.1)

## 2021-04-18 LAB — GLUCOSE, CAPILLARY
Glucose-Capillary: 231 mg/dL — ABNORMAL HIGH (ref 70–99)
Glucose-Capillary: 225 mg/dL — ABNORMAL HIGH (ref 70–99)
Glucose-Capillary: 188 mg/dL — ABNORMAL HIGH (ref 70–99)
Glucose-Capillary: 182 mg/dL — ABNORMAL HIGH (ref 70–99)

## 2021-04-18 LAB — CBC
HCT: 37.4 % (ref 36.0–46.0)
Hemoglobin: 11.7 g/dL — ABNORMAL LOW (ref 12.0–15.0)
MCH: 24.2 pg — ABNORMAL LOW (ref 26.0–34.0)
MCHC: 31.3 g/dL (ref 30.0–36.0)
MCV: 77.4 fL — ABNORMAL LOW (ref 80.0–100.0)
Platelets: 164 10*3/uL (ref 150–400)
RBC: 4.83 MIL/uL (ref 3.87–5.11)
RDW: 27.5 % — ABNORMAL HIGH (ref 11.5–15.5)
WBC: 24.2 10*3/uL — ABNORMAL HIGH (ref 4.0–10.5)
nRBC: 0 % (ref 0.0–0.2)

## 2021-04-18 LAB — CULTURE, BLOOD (ROUTINE X 2)
Culture: NO GROWTH
Special Requests: ADEQUATE

## 2021-04-18 LAB — MAGNESIUM: Magnesium: 1.9 mg/dL (ref 1.7–2.4)

## 2021-04-18 MED ORDER — LACTATED RINGERS IV BOLUS
1000.0000 mL | Freq: Once | INTRAVENOUS | Status: AC
  Administered 2021-04-18: 1000 mL via INTRAVENOUS

## 2021-04-18 MED ORDER — MORPHINE SULFATE ER 15 MG PO TBCR
15.0000 mg | EXTENDED_RELEASE_TABLET | Freq: Two times a day (BID) | ORAL | Status: DC
  Administered 2021-04-18 (×2): 15 mg via ORAL
  Filled 2021-04-18 (×3): qty 1

## 2021-04-18 MED ORDER — INSULIN ASPART 100 UNIT/ML IJ SOLN
0.0000 [IU] | Freq: Three times a day (TID) | INTRAMUSCULAR | Status: DC
  Administered 2021-04-18: 2 [IU] via SUBCUTANEOUS
  Administered 2021-04-18: 1 [IU] via SUBCUTANEOUS
  Administered 2021-04-18: 2 [IU] via SUBCUTANEOUS
  Administered 2021-04-19 (×2): 1 [IU] via SUBCUTANEOUS

## 2021-04-18 MED ORDER — SODIUM CHLORIDE 0.9 % IV BOLUS: 500.0000 mL | Freq: Once | INTRAVENOUS | Status: DC

## 2021-04-18 MED ORDER — POLYETHYLENE GLYCOL 3350 17 G PO PACK: 17.0000 g | PACK | Freq: Every day | ORAL | Status: DC | PRN

## 2021-04-18 MED ORDER — SODIUM CHLORIDE 0.9 % IV BOLUS
1000.0000 mL | Freq: Once | INTRAVENOUS | Status: AC
  Administered 2021-04-18: 1000 mL via INTRAVENOUS

## 2021-04-18 NOTE — Progress Notes (Addendum)
Chaplain received page from pt's RN, Dona Ana, at [phone] stating pt would like to speak with chaplain about advance directives and possibly other things. When chaplain arrived, pt was sound asleep in her bed and did not respond to knock on door or calling of her name. Chaplain notified pt RN that pt was unavailable and requested team place a spiritual care consult to ensure timely follow up. ? ?Addendum-RN notified chaplain that Palliative Chaplain Sallyanne Kuster is following pt. No additional consult needed. ? ?Please page as further needs arise. ? ?Donald Prose. Elyn Peers, M.Div. [facility] ?Chaplain ?Pager [phone] ?Office [phone] ? ?

## 2021-04-18 NOTE — Care Management Important Message (Signed)
Important Message ? ?Patient Details  ?Name: Mariah Henderson ?MRN: [id number] ?Date of Birth: 11/24/1950 ? ? ?Medicare Important Message Given:  Yes ? ? ? ? ?Kordell Jafri ?04/18/2021, 2:34 PM ?

## 2021-04-18 NOTE — Progress Notes (Addendum)
This chaplain is present for F/U spiritual care and updating/creating the Pt. Advance Directive: HCPOA.  ? ?The Pt. son-Tony is at the bedside.  The Pt. participated in AD education and is able to articulate her choice of HCPOA as the Pt. son-Tony. The documentation is filled out for HCPOA. The chaplain is navigating a notary and witness visit for today between 1-3:30pm. ? ?Chaplain Sallyanne Kuster ?[phone] ? ?**1320 The chaplain is present with the notary and witnesses for the signing of the Pt. Advance Directive: HCPOA. The Pt. sister Ursula Alert is at the bedside.  ? ?The Pt. is awake and responded to the chaplain's questions before notarizing the Pt. HCPOA. The chaplain understands the Pt. is choosing her son-Tony as HCPOA. The Pt. refused to keep her arm above the covers to sign the document. The chaplain will revisit on Tuesday.  This chaplain will update [name]. ? ?**1401 This chaplain left a VM for [name] with the return number of the Palliative Medicine Team. ? ?Chaplain Sallyanne Kuster ?[phone] ?

## 2021-04-18 NOTE — Progress Notes (Addendum)
? LOS: 3 days  ?Patient Summary: Mariah Henderson is a 71 y.o. 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 ? ?Overnight Events: Team paged for HR 110-130s. Patient asymptomatic. EKG shows sinus tachycardia with occasional PVC. Received 1.5 L IV fluids. ? ?Interim History: Patient seen and evaluated by team on rounds this AM. Her son, Mariah Henderson, was at bedside and gave additional history. She states that she feels good today. Patient denies pain, but winces when her abdomen is palpated. She is less interactive and has her blankets pulled up to her chin today. Mariah Henderson reports that she is not eating much, but has had 3 BMs in the past day.  ? ?Son is concerned about nutrition and wants to make sure that she is eating meals. He says that she has not been eating much in the hospital due to difficulty with swallowing. He asked about the possibility of feeding tube. We discussed continuing to try oral intake given patient's wish to not have a feeding tube. States that she is unable to tolerate vegetables like broccoli, asking if foods can be pureed. ? ?Objective: ?Vital signs in last 24 hours: ?Vitals:  ? 04/17/21 1934 04/17/21 2137 04/17/21 2322 04/18/21 0446  ?BP: 122/77 124/77 131/83 130/88  ?Pulse: (!) 117 (!) 123 (!) 120 (!) 121  ?Resp: '16 19 16 18  '$ ?Temp: (!) 97.5 ?F (36.4 ?C) 98.4 ?F (36.9 ?C) 98.5 ?F (36.9 ?C) 98 ?F (36.7 ?C)  ?TempSrc: Oral Oral Oral Oral  ?SpO2: 93% 100% 100% 99%  ?Weight:      ?Height:      ? ?SpO2: 99 % ?O2 Flow Rate (L/min): 2 L/min ?Weight change:  ? ?Intake/Output Summary (Last 24 hours) at 04/18/2021 7124 ?Last data filed at 04/18/2021 0620 ?Gross per 24 hour  ?Intake 875.12 ml  ?Output 250 ml  ?Net 625.12 ml  ? ?Constitutional: Pleasant, withdrawn, laying in bed with blankets pulled up to chin. No acute distress.  Chronically-ill appearing. ?Eyes: EOMI. ?Cardio: RRR. No murmurs, rubs, or gallops ?Pulm: Normal work of breathing. ?Abdomen: Tender to palpation in the epigastric region and RLQ. Soft, nondistended. ?MSK: Moves all extremities equally. ?Neuro: Awake and alert. No focal deficits on exam.  ? ?Lab Results: ? ?  Latest Ref Rng & Units 04/18/2021  ?  4:15 AM 04/16/2021  ?  1:46 AM 04/15/2021  ?  8:09 AM  ?CBC  ?WBC 4.0 - 10.5 K/uL 24.2   20.8   19.5    ?Hemoglobin 12.0 - 15.0 g/dL 11.7   11.4   10.8    ?Hematocrit 36.0 - 46.0 % 37.4   38.6   36.2    ?Platelets 150 - 400 K/uL 164   150   170    ? ? ?  Latest Ref Rng & Units 04/18/2021  ?  4:15 AM 04/16/2021  ?  1:46 AM 04/15/2021  ?  8:09 AM  ?CMP  ?Glucose 70 - 99 mg/dL 227   286   153    ?BUN 8 - 23 mg/dL '22   22   15    '$ ?Creatinine 0.44 - 1.00 mg/dL 0.72   0.82   0.72    ?Sodium 135 - 145 mmol/L 129   133   133    ?Potassium 3.5 - 5.1 mmol/L 4.3   4.8  4.5    ?Chloride 98 - 111 mmol/L 93   94   96    ?CO2 22 - 32 mmol/L 30   33   31    ?Calcium 8.9 - 10.3 mg/dL 9.8   10.0   10.0    ?Total Protein 6.5 - 8.1 g/dL 5.8   5.5     ?Total Bilirubin 0.3 - 1.2 mg/dL 1.8   1.7     ?Alkaline Phos 38 - 126 U/L 260   210     ?AST 15 - 41 U/L 71   96     ?ALT 0 - 44 U/L 65   64     ?Mg 1.9 ?CBG 231 ? ?Medications: ?Scheduled Meds: ? atorvastatin  40 mg Oral Daily  ? Chlorhexidine Gluconate Cloth  6 each Topical Daily  ? feeding supplement  237 mL Oral TID BM  ? insulin aspart  0-6 Units Subcutaneous TID WC  ? leptospermum manuka honey  1 application. Topical Daily  ? mouth rinse  15 mL Mouth Rinse BID  ? mometasone-formoterol  2 puff Inhalation BID  ? multivitamin with minerals  1 tablet Oral Daily  ? polyethylene glycol  17 g Oral Daily  ? rivaroxaban  20 mg Oral Q supper  ? senna-docusate  1 tablet Oral QHS  ? sertraline  25 mg Oral Daily  ? sodium chloride flush  10-40 mL Intracatheter Q12H  ? ?Continuous Infusions: ?PRN Meds:.acetaminophen **OR** acetaminophen, albuterol, celecoxib,  morphine, sodium chloride flush, traZODone, white petrolatum ?Assessment/Plan: ?Principal Problem: ?  Metabolic encephalopathy ?Active Problems: ?  Pressure injury of skin ?  Metastatic malignant neoplasm ([place]) ?  Protein-calorie malnutrition, severe ? ?#Pancreatic cancer with liver and lung metastases, stage IV ?#Goals of care ?Plan is to discharge to short-term rehab [facility]) tomorrow and restart low-dose olaparib after discharge. DNAR status confirmed and patient stated that she did not want a feeding tube. Her son, Mariah Henderson, has paperwork in process to become HCPOA. Patient was  on multiple centrally-acting medications and has drowsiness associated with medication use. Discontinued Gabapentin. Patient did not receive morphine yesterday and had HR in 110-130s overnight. Patient is afebrile and not endorsing any symptoms of systemic infection. No changes in oxygen requirements. Tachycardia is likely due to increased pain. Will re-schedule morphine and monitor HR. Due to decreased appetite, we will consult SLP and RD to assist with improving oral intake and nutrition. ?--Palliative care assisting with [place] conversations, appreciate assistance ?--Continue holding [place] with plan to restart at '100mg'$  BID after discharge ?--Close follow-up with Oncology after discharge ?--Celecoxib '100mg'$  BID PRN ?--Morphine '15mg'$  BID PO ?--Cardiac monitoring ?--SLP eval and treat ?--RD consult ?  ?#Constipation ?Patient had documented BM yesterday.  Son reports multiple Bms today. On chronic opioid pain medication. ?--Miralax daily PRN ?--Senokot QHS ? ?#Depression ?Her son, Mariah Henderson, reports that she has not been taking Sertraline at home and is concerned about it making her more drowsy. Patient was weaned down to '25mg'$  today from '50mg'$ .  ?- d/c Sertraline ? ?#Leukocytosis ?Repeat blood cultures show NGTD. Patient remains afebrile. WBC up to 24.2 today, but leukocytosis is likely due to malignancy. Patient denies urinary symptoms. ?   ?Diet: Dysphagia 3 ?VTE: Xarelto '20mg'$  daily ?IVF: None ?Code: DNAR ? ?Prior to Admission Living Arrangement: home ?Anticipated Discharge Location: SNF ?Barriers to Discharge: SNF placement ?Dispo: Anticipated discharge tomorrow.  ? ?This is a Careers information officer Note.  The care of the patient was discussed with Dr. Howie Ill and the  assessment and plan formulated with their assistance.  Please see their attached note for official documentation of the daily encounter. ? ?Nelva Nay, Medical Student ?04/18/2021, 6:52 AM ?Pager: [phone] ?After 5pm on weekdays and 1pm on weekends: On Call pager [phone] ? ?Attestation for Student Documentation: ? ?I personally was present and performed or re-performed the history, physical exam and medical decision-making activities of this service and have verified that the service and findings are accurately documented in the student?s note. ? ?Tremayne Sheldon, Joellen Jersey, DO ?04/18/2021, 2:49 PM  ? ? ?

## 2021-04-18 NOTE — TOC Progression Note (Signed)
Transition of Care (TOC) - Progression Note  ? ? ?Patient Details  ?Name: Mariah Henderson ?MRN: [id number] ?Date of Birth: Nov 03, 1950 ? ?Transition of Care (TOC) CM/SW Contact  ?Pollie Friar, RN ?Phone Number: ?04/18/2021, 1:25 PM ? ?Clinical Narrative:    ?CM met with the patient and her sister, Magdaline at the bedside. They want to try [facility] for SNF rehab. Cm has updated [facility] and they have offered a bed. CM has asked CM MOA to begin insurance authorization for a tomorrow admit.  ?TOC following. ? ? ?Expected Discharge Plan: [employer] ?Barriers to Discharge: Continued Medical Work up ? ?Expected Discharge Plan and Services ?Expected Discharge Plan: [place] ?In-house Referral: Clinical Social Work ?Discharge Planning Services: CM Consult ?Post Acute Care Choice: [place] ?Living arrangements for the past 2 months: Apartment ?                ?  ?  ?  ?  ?  ?  ?  ?  ?  ?  ? ? ?Social Determinants of Health (SDOH) Interventions ?  ? ?Readmission Risk Interventions ?   ? View : No data to display.  ?  ?  ?  ? ? ?

## 2021-04-18 NOTE — Progress Notes (Signed)
SLP Cancellation Note ? ?Patient Details ?Name: Mariah Henderson ?MRN: [id number] ?DOB: Apr 04, 1950 ? ? ?Cancelled treatment:       Reason Eval/Treat Not Completed: Patient's level of consciousness. Pt quite sleepy, not appropriate for POs at this time. Family demonstrates that she is finely chopping her food, but even so, pt is barely eating. She has no appetite. Pt is able to swallow and she is being presented a variety of foods of appropriate texture. Will f/u tomorrow to observe pt with PO intake when more alert, but doubtful that SLP interventions will be beneficial.  ? ? ?Leaner Morici, Katherene Ponto ?04/18/2021, 2:20 PM ?

## 2021-04-19 LAB — CBC WITH DIFFERENTIAL/PLATELET
Abs Immature Granulocytes: 0.33 10*3/uL — ABNORMAL HIGH (ref 0.00–0.07)
Basophils Absolute: 0 10*3/uL (ref 0.0–0.1)
Basophils Relative: 0 %
Eosinophils Absolute: 0.3 10*3/uL (ref 0.0–0.5)
Eosinophils Relative: 1 %
HCT: 35.5 % — ABNORMAL LOW (ref 36.0–46.0)
Hemoglobin: 11.1 g/dL — ABNORMAL LOW (ref 12.0–15.0)
Immature Granulocytes: 1 %
Lymphocytes Relative: 1 %
Lymphs Abs: 0.3 10*3/uL — ABNORMAL LOW (ref 0.7–4.0)
MCH: 24.6 pg — ABNORMAL LOW (ref 26.0–34.0)
MCHC: 31.3 g/dL (ref 30.0–36.0)
MCV: 78.7 fL — ABNORMAL LOW (ref 80.0–100.0)
Monocytes Absolute: 1.8 10*3/uL — ABNORMAL HIGH (ref 0.1–1.0)
Monocytes Relative: 7 %
Neutro Abs: 23.8 10*3/uL — ABNORMAL HIGH (ref 1.7–7.7)
Neutrophils Relative %: 90 %
Platelets: 174 10*3/uL (ref 150–400)
RBC: 4.51 MIL/uL (ref 3.87–5.11)
RDW: 28.2 % — ABNORMAL HIGH (ref 11.5–15.5)
WBC: 26.6 10*3/uL — ABNORMAL HIGH (ref 4.0–10.5)
nRBC: 0 % (ref 0.0–0.2)

## 2021-04-19 LAB — BASIC METABOLIC PANEL
Anion gap: 5 (ref 5–15)
BUN: 19 mg/dL (ref 8–23)
CO2: 32 mmol/L (ref 22–32)
Calcium: 9.9 mg/dL (ref 8.9–10.3)
Chloride: 101 mmol/L (ref 98–111)
Creatinine, Ser: 0.61 mg/dL (ref 0.44–1.00)
GFR, Estimated: >60 mL/min (ref >60)
Glucose, Bld: 150 mg/dL — ABNORMAL HIGH (ref 70–99)
Potassium: 4.1 mmol/L (ref 3.5–5.1)
Sodium: 138 mmol/L (ref 135–145)

## 2021-04-19 LAB — GLUCOSE, CAPILLARY
Glucose-Capillary: 160 mg/dL — ABNORMAL HIGH (ref 70–99)
Glucose-Capillary: 137 mg/dL — ABNORMAL HIGH (ref 70–99)
Glucose-Capillary: 165 mg/dL — ABNORMAL HIGH (ref 70–99)
Glucose-Capillary: 114 mg/dL — ABNORMAL HIGH (ref 70–99)

## 2021-04-19 MED ORDER — LACTATED RINGERS IV SOLN: INTRAVENOUS | Status: DC

## 2021-04-19 NOTE — Progress Notes (Signed)
SLP Cancellation Note ? ?Patient Details ?Name: Mariah Henderson ?MRN: [id number] ?DOB: Dec 23, 1950 ? ? ?Cancelled treatment:   Pt not sufficiently alert to eat - sister at bedside and voicing concerns.  Will return when MS has improved. ? ?Jesly Hartmann L. Rasul Decola, MA CCC/SLP ?Acute Rehabilitation Services ?Office number [phone] ?Pager [phone] ?    ? ? ?[name] ?04/19/2021, 11:01 AM ?

## 2021-04-19 NOTE — TOC Progression Note (Signed)
Transition of Care (TOC) - Progression Note  ? ? ?Patient Details  ?Name: Mariah Henderson ?MRN: [id number] ?Date of Birth: 17-Sep-1950 ? ?Transition of Care (TOC) CM/SW Contact  ?Pollie Friar, RN ?Phone Number: ?04/19/2021, 1:14 PM ? ?Clinical Narrative:    ?Have insurance auth for SNF at [facility].  ?MD want to hold today due to worsening mentation. SNF updated.  ?TOC following. ? ? ?Expected Discharge Plan: [place] ?Barriers to Discharge: Continued Medical Work up ? ?Expected Discharge Plan and Services ?Expected Discharge Plan: [place] ?In-house Referral: Clinical Social Work ?Discharge Planning Services: CM Consult ?Post Acute Care Choice: [place] ?Living arrangements for the past 2 months: Apartment ?                ?  ?  ?  ?  ?  ?  ?  ?  ?  ?  ? ? ?Social Determinants of Health (SDOH) Interventions ?  ? ?Readmission Risk Interventions ?   ? View : No data to display.  ?  ?  ?  ? ? ?

## 2021-04-19 NOTE — Progress Notes (Signed)
Occupational Therapy Treatment ?Patient Details ?Name: Mariah Henderson ?MRN: [id number] ?DOB: Nov 16, 1950 ?Today's Date: 04/19/2021 ? ? ?History of present illness Pt. is a 71 y.o. F. presenting to [facility] on 3/29 after being found unresponsive at home. Only change in her routine was taking a new cancer medication Mariah Henderson) and not taking her morning morphine. CT shows possible R pons infarct and MRI shows no acute infarct, both show age related cerebral atrophy. CXR shows Mixed interstitial and airspace opacities bilaterally, likely a  combination pulmonary edema and underlying metastatic disease. Pt also has an unstagable pressure wound on her L hip. 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 ?  ?OT comments ? Pt seen for OT treatment this am and appeared much different regarding attention, awareness, and memory compared to last visit with this therapist.  She was unable to consistently follow one step commands and needed max hand over hand to complete simple grooming task of washing her face.  Total assist for donning gripper socks with pt bringing the sock to her mouth as if it was a washcloth, even though cued multiple times.  Discussed with nursing and PT who saw her after this therapist.  According to PT discussion, pt's sister came in and stated this is how pt gets when she's given morphine.  Nursing made aware.  See PT note for more details.  ? ?Recommendations for follow up therapy are one component of a multi-disciplinary discharge planning process, led by the attending physician.  Recommendations may be updated based on patient status, additional functional criteria and insurance authorization. ?   ?Follow Up Recommendations ? Skilled nursing-short term rehab (<3 hours/day)  ?  ?Assistance Recommended at Discharge Frequent or constant Supervision/Assistance  ?Patient can return home with the following ? A little help with walking and/or  transfers;A little help with bathing/dressing/bathroom;Assistance with cooking/housework;Direct supervision/assist for financial management;Assist for transportation;Help with stairs or ramp for entrance ?  ?Equipment Recommendations ? Other (comment) (TBD next venue of care)  ?  ?   ?Precautions / Restrictions Precautions ?Precautions: Fall;Other (comment) ?Precaution Comments: watch 02/HR ?Restrictions ?Weight Bearing Restrictions: No  ? ? ?  ? ?Mobility Bed Mobility ?Overal bed mobility: Needs Assistance ?Bed Mobility: Supine to Sit ?  ?  ?Supine to sit: Mod assist (HOB flat) ?  ?  ?  ?  ? ?Transfers ?Overall transfer level: Needs assistance ?Equipment used: Rolling walker (2 wheels) ?Transfers: Sit to/from Stand ?Sit to Stand: Min assist (min assist from EOB with BUEs on walker instead of pushing up from the surface.) ?  ?  ?  ?  ?  ?General transfer comment: Increased posterior lean and LOB in standing requiring overall mod assist to maintain.  Pt stood for less than one minute before sitting down abruptly without any verbal warning. ?  ?  ?Balance Overall balance assessment: Needs assistance ?Sitting-balance support: Feet supported, No upper extremity supported ?Sitting balance-Leahy Scale: Good ?  ?Postural control: Posterior lean ?Standing balance support: Bilateral upper extremity supported, During functional activity ?Standing balance-Leahy Scale: Poor ?Standing balance comment: Pt with increased posterior lean and LOB in standing. ?  ?  ?  ?  ?  ?  ?  ?  ?  ?  ?  ?   ? ?ADL either performed or assessed with clinical judgement  ? ?ADL Overall ADL's : Needs assistance/impaired ?  ?  ?Grooming: Wash/dry face;Sitting;Maximal assistance ?Grooming Details (indicate cue type and reason):  hand over hand for initiation and completion of task ?  ?  ?  ?  ?  ?  ?Lower Body Dressing: Total assistance ?Lower Body Dressing Details (indicate cue type and reason): total assist for gripper socks secondary to decreased  sustained attention, initiation, and ability to understand and follow one step commands. ?  ?  ?  ?  ?  ?  ?  ?General ADL Comments: Pt very sleepy and lethargic this session.  BP in supine at 127/72 and only slightly higer in sitting with O2 sats at 98% on 2Ls nasal cannula.  She needed mod assist for supine to sit with min guard for balance.  Noted slight jerking motion at times in trunk and UEs with pt exhibiting decreased ability to follow one step commands throughout session.  Min assist for sit to stand from the EOB with mod assist to maintain static sitting balance with use of the UE for support. ?  ? ? ?   ?   ?   ? ?Cognition Arousal/Alertness: Lethargic ?Behavior During Therapy: [facility] for tasks assessed/performed ?Overall Cognitive Status: Impaired/Different from baseline ?Area of Impairment: Orientation, Attention, Memory, Following commands, Safety/judgement, Awareness, Problem solving ?  ?  ?  ?  ?  ?  ?  ?  ?Orientation Level: Time, Situation, Place (pt oriented to place but not any other) ?Current Attention Level: Focused (Unable to maintain sustain attention to grooming tasks.) ?Memory: Decreased short-term memory ?Following Commands:  (Pt with decreased ability to follow one step commands this session) ?Safety/Judgement: Decreased awareness of safety, Decreased awareness of deficits ?Awareness: Intellectual ?Problem Solving: Slow processing, Decreased initiation, Difficulty sequencing, Requires verbal cues, Requires tactile cues ?General Comments: Pt needed hand over hand assist for initiation of washing her face secondary to decreased sustained attention.  Pt much more lethargic this session compared to previous session. ?  ?  ?   ?   ?   ?   ? ? ?Pertinent Vitals/ Pain       Pain Assessment ?Pain Assessment: Faces ?Pain Score: 0-No pain ? ?   ?   ? ?Frequency ? Min 2X/week  ? ? ? ? ?  ?Progress Toward Goals ? ?OT Goals(current goals can now be found in the care plan section) ? Progress towards OT  goals: OT to reassess next treatment ? ?Acute Rehab OT Goals ?Patient Stated Goal: Pt unable to state at this time. ?OT Goal Formulation: With patient ?Time For Goal Achievement: 04/28/21 ?Potential to Achieve Goals: Good  ?Plan Discharge plan remains appropriate   ? ?   ?AM-PAC OT "6 Clicks" Daily Activity     ?Outcome Measure ? ? Help from another person eating meals?: A Lot ?Help from another person taking care of personal grooming?: A Lot ?Help from another person toileting, which includes using toliet, bedpan, or urinal?: A Lot ?Help from another person bathing (including washing, rinsing, drying)?: A Lot ?Help from another person to put on and taking off regular upper body clothing?: A Lot ?Help from another person to put on and taking off regular lower body clothing?: A Lot ?6 Click Score: 12 ? ?  ?End of Session Equipment Utilized During Treatment: Gait belt;Oxygen ? ?OT Visit Diagnosis: Unsteadiness on feet (R26.81);Muscle weakness (generalized) (M62.81);Other symptoms and signs involving cognitive function ?  ?Activity Tolerance Patient limited by lethargy ?  ?Patient Left  (sitting EOB with PT in for next session) ?  ?Nurse Communication Mobility status;Other (comment) (confusion and lethargy) ?  ? ?   ? ?  Time: 1005-1035 ?OT Time Calculation (min): 30 min ? ?Charges: OT General Charges ?$OT Visit: 1 Visit ?OT Treatments ?$Self Care/Home Management : 23-37 mins ? ?Sierria Bruney OTR/L ?04/19/2021, 11:05 AM ?

## 2021-04-19 NOTE — Progress Notes (Addendum)
? LOS: 4 days  ?Patient Summary: Mariah Henderson is a 71 y.o. 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 ? ?Overnight Events: No acute overnight events. ? ?Interim History: Patient seen and evaluated at bedside by team on rounds. She states that she is doing okay, but is very sleepy. She has not been eating much, not even drinking much of the Ensure shakes. She has a little pain in her belly. She had a BM yesterday. No pain when going to the bathroom. Patient's morphine held this morning, so last dose was last night. ? ?Her sister Mariah Henderson is at bedside and concerned about the effect of pain medications on drowsiness.   ? ?Objective: ?Vital signs in last 24 hours: ?Vitals:  ? 04/18/21 2007 04/19/21 0008 04/19/21 0357 04/19/21 0736  ?BP: (!) 145/71 (!) 142/78 (!) 146/92 134/90  ?Pulse: (!) 111 (!) 110 (!) 108 (!) 106  ?Resp: '16 16 18 18  '$ ?Temp: 98.2 ?F (36.8 ?C) 97.7 ?F (36.5 ?C) 98.1 ?F (36.7 ?C) 98 ?F (36.7 ?C)  ?TempSrc: Oral Oral Oral Oral  ?SpO2: 90% (!) 89% 93% 96%  ?Weight:      ?Height:      ? ?SpO2: 96 % ?O2 Flow Rate (L/min): 2 L/min ?Weight change:  ? ?Intake/Output Summary (Last 24 hours) at 04/19/2021 0816 ?Last data filed at 04/19/2021 0500 ?Gross per 24 hour  ?Intake 124.07 ml  ?Output 300 ml  ?Net -175.93 ml  ? ?Constitutional: Somnolent. Laying in bed with blankets pulled up to chin. No acute distress. Chronically-Henderson appearing. ?Eyes: EOMI. ?Cardio: RRR. No murmurs, rubs, or gallops ?Pulm: Normal work of breathing. ?Abdomen: Tender to palpation in the epigastric region. Soft, nondistended. ?MSK: Moves all extremities equally. ?Neuro: Oriented to self and place. No focal deficits on exam.  ? ?Lab Results: ? ?  Latest Ref Rng & Units 04/19/2021  ?  5:00 AM 04/18/2021  ?  4:15 AM 04/16/2021  ?  1:46 AM  ?CBC  ?WBC 4.0 - 10.5 K/uL  26.6   24.2   20.8    ?Hemoglobin 12.0 - 15.0 g/dL 11.1   11.7   11.4    ?Hematocrit 36.0 - 46.0 % 35.5   37.4   38.6    ?Platelets 150 - 400 K/uL 174   164   150    ? ? ?  Latest Ref Rng & Units 04/19/2021  ?  5:00 AM 04/18/2021  ?  4:15 AM 04/16/2021  ?  1:46 AM  ?CMP  ?Glucose 70 - 99 mg/dL 150   227   286    ?BUN 8 - 23 mg/dL '19   22   22    '$ ?Creatinine 0.44 - 1.00 mg/dL 0.61   0.72   0.82    ?Sodium 135 - 145 mmol/L 138   129   133    ?Potassium 3.5 - 5.1 mmol/L 4.1   4.3   4.8    ?Chloride 98 - 111 mmol/L 101   93   94    ?CO2 22 - 32 mmol/L 32   30   33    ?Calcium 8.9 - 10.3 mg/dL 9.9   9.8   10.0    ?Total Protein 6.5 - 8.1 g/dL  5.8   5.5    ?Total Bilirubin 0.3 - 1.2 mg/dL  1.8   1.7    ?Alkaline Phos 38 - 126 U/L  260   210    ?AST 15 - 41 U/L  71   96    ?ALT 0 - 44 U/L  65   64    ? ?Medications: ?Scheduled Meds: ? atorvastatin  40 mg Oral Daily  ? Chlorhexidine Gluconate Cloth  6 each Topical Daily  ? feeding supplement  237 mL Oral TID BM  ? insulin aspart  0-6 Units Subcutaneous TID WC  ? leptospermum manuka honey  1 application. Topical Daily  ? mouth rinse  15 mL Mouth Rinse BID  ? mometasone-formoterol  2 puff Inhalation BID  ? morphine  15 mg Oral Q12H  ? multivitamin with minerals  1 tablet Oral Daily  ? rivaroxaban  20 mg Oral Q supper  ? senna-docusate  1 tablet Oral QHS  ? sodium chloride flush  10-40 mL Intracatheter Q12H  ? ?Continuous Infusions: ?PRN Meds:.acetaminophen **OR** acetaminophen, albuterol, celecoxib, polyethylene glycol, sodium chloride flush, traZODone, white petrolatum ?Assessment/Plan: ?Principal Problem: ?  Metabolic encephalopathy ?Active Problems: ?  Pressure injury of skin ?  Metastatic malignant neoplasm ([place]) ?  Protein-calorie malnutrition, severe ? 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 Her son, Mariah Henderson, has paperwork in process to  become HCPOA. Today patient is more somnolent. She did not receive pain medication this morning prior to evaluation due to not being able to take PO medications. Morphine held due to somnolence. Patient is afebrile and without any symptoms of systemic infection. No changes in oxygen requirements. Tachycardia is likely due to increased pain, as HR decreased with scheduled morphine. Oncology to evaluated patient today and concern that this is reflective of progression of malignancy. Student doctor Mariah Henderson called and spoke with [name] to update and let know Dr. Burr Henderson, patient's oncologist would be stopping by 4/5. ? ?--appreciate oncology recommendation, patient may need to move to hospice if this is progression of disease ?--Palliative care assisting with [place] conversations, appreciate assistance ?-- Maintenance fluids with LR at 92 mL/hr ?--Continue holding Calpella with plan to restart at '100mg'$  BID after discharge ?--Celecoxib '100mg'$  BID PRN ?-- D/c Morphine ?--Cardiac monitoring ?  ?#Constipation ?Patient had documented BM yesterday. On chronic opioid pain medication. ?--Miralax daily PRN ?--Senokot QHS ?  ?#Leukocytosis ?Repeat blood cultures show NGTD. Patient remains afebrile. WBC up to 26.6 today, but leukocytosis is likely due to malignancy. ? ?Diet: Dysphagia 3 ?VTE: Xarelto '20mg'$  daily ?IVF: None ?Code: DNAR ? ?Prior to Admission Living Arrangement: home ?Anticipated Discharge Location: SNF ?Barriers to Discharge: medical stability ?Dispo: Anticipated discharge in approximately 1 day(s).  ? ?This is a Careers information officer Note.  The care of the patient was discussed with Dr. Howie Henderson and the assessment and plan formulated with their assistance.  Please see their attached note for official documentation of the daily encounter. ? ?Mariah Henderson, Medical Student ?04/19/2021, 8:16 AM ?Pager: [phone] ?After 5pm on weekdays and 1pm on weekends: On Call pager [phone] ? ?Attestation for Student Documentation: ? ?I  personally was present and performed or re-performed the history, physical exam and medical decision-making activities of this service and have verified that the service and findings are accurately documented in the student?s note. ? ?Manan Olmo, Joellen Jersey, DO ?04/19/2021, 4:27 PM  ?  ?

## 2021-04-19 NOTE — Progress Notes (Signed)
?Daily Progress Note  ? ?Patient Name: Lynleigh Kovack       Date: 04/19/2021 ?DOB: [birth year]  Age: 71 y.o. MRN#: [id number] ?Attending Physician: Charise Killian, MD ?Primary Care Physician: Sid Falcon, MD ?Admit Date: 04/13/2021 ?Length of Stay: 4 days ? ?Reason for Consultation/Follow-up: Establishing goals of care ? ?HPI/Patient Profile:  71 y.o. 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 Family felt patient's conversation was becoming abnormal and sister went to check on her, found unresponsive in the bed. Brought to the ER as Code Stroke. Recently started new medication (5th line treatment for pancreatic CA), noted confused. Brain imaging less suspicious for CVA. She was admitted on 2/77/8242 with Metabolic encephalopathy, Stage IV pancreatic CA (mets to liver and lungs, progressive), and others.  ?  ?PMT consulted for [place] discussions. Of note, has recently seen [name], NP in the [place] clinic on 04/06/21. Made adjustments to pain regimen for increasing pain likely due to liver tumor burden, medication adjustments for constipation, began discussions on [place] but no official decisions made that day. ? ?PMT consulted for [place] conversations. ? ?Subjective:  ? ?Subjective: ?Chart Reviewed. Updates received. Patient Assessed. Created space and opportunity for patient  and family to explore thoughts and feelings regarding current medical situation. ? ?Today's Discussion: Today I met with the patient at the bedside. She is much more sleepy then previously. Notes from yesterday indicate more sleepiness. Son asked about feeding tube due to malnutrition and Resident reminded him of patient wish for no feeding tube. Spiritual care attempted to get HCPOA papers signed (unsuccessful). PT entered the room and will attempt to get her up for therapy,  she is agreeable. ? ?I provided emotional and general support through therapeutic tough, empathy, and other techniques. I answered all questions and addressed all concerns to the best of my ability. ? ?I DISCUSSED CERTAIN ASPECTS OF A MOST form on 4/1. The patient and family outlined their wishes for the following treatment decisions: ? ?Cardiopulmonary Resuscitation: Do Not Attempt Resuscitation (DNR/No CPR)  ?Feeding Tube: No feeding tube  ? ? ? ?Review of Systems  ?Constitutional:   ?     "I'm doing ok" with no general complaints  ?Respiratory:  Negative for cough and shortness of breath.   ?Gastrointestinal:  Negative for abdominal pain.  ?Musculoskeletal:   ?     Did ambulate with PT yesterday  ? ?Objective:  ? ?Vital Signs:  ?BP 134/90 (BP Location: Left Arm)   Pulse (!) 106   Temp 98 ?F (36.7 ?C) (Oral)   Resp 18   Ht 5' (1.524 m)   Wt 51.6 kg   LMP 04/29/1966 (LMP Unknown)   SpO2 96%   BMI 22.22 kg/m?  ? ?Physical Exam: ?Physical Exam ?Vitals and nursing note reviewed.  ?Constitutional:   ?   General: She is not in acute distress. ?   Appearance: She is ill-appearing.  ?HENT:  ?   Head: Normocephalic and atraumatic.  ?Pulmonary:  ?   Effort: Pulmonary effort is normal. No respiratory distress.  ?Abdominal:  ?   General: Abdomen is flat.  ?Skin: ?   General: Skin is warm and dry.  ?Neurological:  ?   Mental Status: She is alert.  ?Psychiatric:     ?   Mood and Affect: Mood normal.     ?  Behavior: Behavior normal.  ? ? ?Palliative Assessment/Data: 50% ? ? ?Assessment & Plan:  ? ?Impression: ?Present on Admission: ?? Metabolic encephalopathy ? ?71 year old female with stage IV pancreatic cancer with progressive mets to liver and lung, worsening of her disease burden despite oncology treatment and is currently on fifth line treatment (not chemotherapy oral medication).  She presented with confusion and work-up is ongoing as to the etiology, likely multifactorial given progressive disease, dehydration,  elevated white count. Also with new positive blood cultures, planned start vancomycin.  EEG with global changes, nothing acute.  Brain imaging did not show acute stroke to explain her confusion.  She has begun goals of care conversation with her family.  She did elect DNR yesterday and confirmed this as well as understanding of what this means.  She seems to have a firm grasp on her clinical situation, even though she is confused to the year.  Poor overall prognosis.  After family meeting confirmed DNR status and no desire for feeding tubes in the future.  Further goals of care discussions to be determined at/after oncology follow-up in 1 to 2 weeks. ? ?SUMMARY OF RECOMMENDATIONS   ?Remain DNR ?Anticipate d/c to SNF/[place] in the next 48 hours ?The patient does not desire any feeding tubes and I would avoid offering ?The patient would be a candidate for hospice should this be the road that she elects to follow, pending her clinical progression ?Chaplain consult for spiritual care and HCPOA completion, HCPOA likely Monday at earliest ?Plan for outpatient follow-up in the oncology symptom management/palliative care clinic ?PMT will continue to follow ? ?Symptom Management:  ?Per primary team ? ?Code Status: DNR ? ?Prognosis: Unable to determine ? ?Discharge Planning: To Be Determined ? ?Discussed with: Patient, medical team, nursing team, PT team ? ?Billing based on: Time ? ?Billing based on MDM: High ? ?Problems Addressed: One acute or chronic illness or injury that poses a threat to life or bodily function ? ?Amount and/or Complexity of Data: Category 3:Discussion of management or test interpretation with external physician/other qualified health care professional/appropriate source (not separately reported) ? ?Risks: Decision not to resuscitate or to de-escalate care because of poor prognosis  ? ?[name], NP ?Palliative Medicine Team ? ?Team Phone # [phone] (Nights/Weekends) ? 09/14/2020, 8:17 AM  ? ?

## 2021-04-19 NOTE — Progress Notes (Addendum)
HEMATOLOGY-ONCOLOGY PROGRESS NOTE ? ?ASSESSMENT AND PLAN: ?1.  Metastatic pancreatic cancer, liver and lung metastases ?2.  Leukocytosis ?3.  Normocytic anemia ?4.  Mild transaminitis and hyperbilirubinemia ?5.  Acute metabolic encephalopathy ?6.  Hypertension ?7.  Type 2 diabetes mellitus ? ?-The patient is more somnolent at this time.  She has been receiving MS Contin 15 mg every 12 hours the last dose was given last evening.  It was held this morning.  This is not a new medication for this patient.  I do not see that she has received any other as needed pain medication. ?-I do note that her white blood cell count has been slowly rising during this hospital admission.  She does not have a fever.  She was noted to have 1 positive blood culture with staph epi thought to be contaminant earlier this admission.  Her UA was not indicative of UTI.  Leukocytosis could be reactive in the absence of any signs of infection.  ?-I suspect her decline is related to her underlying metastatic malignancy which has been progressive.  She has been slowly declining over the past few months.  Appreciate assistance of primary team as well as palliative care team with goals of care discussion.  I had an extensive discussion with the palliative care APP following my visit.  They plan to continue to engage family members with ongoing goals of care discussion. ?-She is currently a DNR/DNI.  Patient had expressed that she did not desire tube feeding.  Agree with DNR status and not proceeding with placement of feeding tube.  We would recommend hospice if she does not improve. ?-We will continue to hold chemotherapy. ? ?Mikey Bussing, DNP, AGPCNP-BC, AOCNP ? ?SUBJECTIVE: Mariah Henderson is more somnolent today.  When I saw her, there was no family at the bedside.  She opened her eyes briefly and acknowledged my presence but did not answer any of my questions.  She did try to mumble some answers which were incoherent.  She fell back asleep very  quickly today. ? ?Oncology History Overview Note  ?Cancer Staging ?Pancreatic cancer ([place]) ?Staging form: Exocrine Pancreas, AJCC 8th Edition ?- Clinical: Stage IV (cT1c, cN0, pM1) - Signed by Truitt Merle, MD on 02/26/2020 ?Total positive nodes: 0 ? ?  ?Pancreatic cancer ([place])  ?01/13/2020 Imaging  ? US Abdomen 01/13/20  ?IMPRESSION: ?1. Suggestion of 3 vascular hypoechoic mass within the liver ?measuring up to 2.5 cm. ?2. Suggestion of a hypoechoic mass within the tail of the pancreas ?that is not well visualized. ?3. Findings concerning for malignancy, please see separately ?dictated CT abdomen pelvis 01/13/2020. ?  ?01/13/2020 Imaging  ? CT AP 01/13/20  ?IMPRESSION: ?1. Hypoenhancing 2.0 cm in long axis mass in the pancreatic tail ?compatible with pancreatic adenocarcinoma. The mass abuts the ?posterior gastric wall but without definite gastric invasion. The ?splenic vein appears narrowed/attenuated compared to prior exams and ?the lesion abuts the margin of the splenic artery. ?2. Hypoenhancing hepatic masses are new compared to 11/22/2017 and ?are highly suspicious for metastatic lesions. ?3. Questionable 0.8 by 0.8 cm omental tumor nodule versus dense ?diverticulum above the transverse colon. ?4. Other imaging findings of potential clinical significance: Mild ?distal esophageal wall thickening, query esophagitis. Stable left ?adrenal adenoma. Sigmoid colon diverticulosis. Lumbar and thoracic ?spondylosis and degenerative disc disease contributing to multilevel ?impingement. ?5. Aortic atherosclerosis. ?Aortic Atherosclerosis (ICD10-I70.0). ?  ?01/21/2020 Initial Diagnosis  ? Pancreatic cancer [facility]) ?  ?01/28/2020 Initial Biopsy  ? FINAL MICROSCOPIC DIAGNOSIS:  ? ?A.  LIVER, NEEDLE CORE BIOPSY:  ?- Adenocarcinoma.  ? ?COMMENT:  ? ?Immunohistochemistry for CK7 is positive.  CDX-2 demonstrates weak to  ?moderate positive staining.  TTF-1 and PAX 8 demonstrate weak, likely  ?nonspecific staining.  CK20, GATA-3 and ER are  negative.  The  ?morphologic and immunophenotypic characteristics are compatible with the  ?clinical impression of a primary pancreatic cancer.  Radiologic  ?correlation is encouraged.  If applicable, there is likely sufficient  ?tissue for ancillary studies (Block A2).  Dr. Vic Ripper reviewed the  ?case.  ?  ?02/26/2020 - 05/19/2020 Chemotherapy  ? First-line Gemcitabine and Abraxane every 2 weeks starting 02/26/20. D/c after 05/19/20 due to disease progression in liver and neuropathy  ? ?  ?02/26/2020 Cancer Staging  ? Staging form: Exocrine Pancreas, AJCC 8th Edition ?- Clinical: Stage IV (cT1c, cN0, pM1) - Signed by Truitt Merle, MD on 02/26/2020 ?Total positive nodes: 0 ?  ?03/08/2020 Genetic Testing  ? Negative hereditary cancer genetic testing: no pathogenic variants detected in [facility] Common Hereditary Cancers Panel with preliminary evidence pancreatic cancer genes.  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 The report date is March 08, 2020.   ? ?The Common Hereditary Cancers Panel with preliminary evidence pancreatic cancer genes offered by [facility] includes sequencing and/or deletion duplication testing of the following 49 genes: APC, ATM, AXIN2, BARD1, BMPR1A, BRCA1, BRCA2, BRIP1, CDH1, CDK4, CDKN2A (p14ARF), CDKN2A (p16INK4a), CHEK2, CTNNA1, DICER1, EPCAM (Deletion/duplication testing only), GREM1 (promoter region deletion/duplication testing only), FANCC, GREM1, HOXB13, KIT, MEN1, MLH1, MSH2, MSH3, MSH6, MUTYH, NBN, NF1, NHTL1, PALB2, PALLD, PDGFRA, PMS2, POLD1, POLE, PTEN, RAD50, RAD51C, RAD51D, SDHA, SDHB, SDHC, SDHD, SMAD4, SMARCA4. STK11, TP53, TSC1, TSC2, and VHL.  The following genes were evaluated for sequence changes only: SDHA and HOXB13 c.251G>A variant only.es only: SDHA and HOXB13 c.251G>A variant only. ?  ?05/03/2020 Imaging  ? CT AP  ?IMPRESSION: ?1. Interval decrease in size of a hypodense mass of the central ?pancreatic tail, consistent with treatment response  of primary ?pancreatic malignancy. ?2. Numerous low-attenuation liver lesions are seen, increased in ?size and number compared to prior examination. Findings are ?consistent with worsened hepatic metastatic disease. ?3. A previously queried omental nodule adjacent to the transverse ?colon is more clearly a prominent diverticulum on current ?examination. ?  ?Aortic Atherosclerosis (ICD10-I70.0). ?  ?06/02/2020 -  Chemotherapy  ? Second-line FOLFIRI q2weeks starting 06/02/20 ? ?  ?12/15/2020 - 02/18/2021 Chemotherapy  ? Patient is on Treatment Plan : PANCREAS FOLFOX q14d  ?   ?02/28/2021 Imaging  ? CT CAP IMPRESSION: ?1. Progressive metastatic disease, with substantial enlargement of the multilobular dominant hepatic metastatic lesion, as well as scattered pulmonary nodules which are increasing in size. ?2. Other imaging findings of potential clinical significance: Aortic Atherosclerosis (ICD10-I70.0). Coronary atherosclerosis. Peripheral atherosclerosis. Fatty prominence of the interatrial septum. Possible esophagitis. Emphysema (ICD10-J43.9). Left adrenal adenoma. Atrophic pancreatic tail. Sigmoid colon diverticulosis. Mild multilevel lumbar impingement. No discrete splenic mass today. ?  ?03/09/2021 -  Chemotherapy  ? Patient is on Treatment Plan : PANCREAS Liposomal Irinotecan / Leucovorin / 5-FU  q14d  ?   ? ? ? ?REVIEW OF SYSTEMS:   ?Review of Systems  ?Reason unable to perform ROS: I was unable to obtain review of systems due to somnolence.  No fevers documented.  ? ?I have reviewed the past medical history, past surgical history, social history and family history with the patient and they are unchanged from previous note. ? ? ?PHYSICAL EXAMINATION: ?ECOG PERFORMANCE STATUS: 3 -  Symptomatic, >50% confined to bed ? ?Vitals:  ? 04/19/21 0736 04/19/21 1136  ?BP: 134/90 134/78  ?Pulse: (!) 106 97  ?Resp: 18 18  ?Temp: 98 ?F (36.7 ?C) 97.7 ?F (36.5 ?C)  ?SpO2: 96% 99%  ? ?Filed Weights  ? 04/13/21 1300 04/13/21 1715   ?Weight: 51.6 kg 51.6 kg  ? ? ?Intake/Output from previous day: ?04/03 0701 - 04/04 0700 ?In: 124.1 [IV Piggyback:124.1] ?Out: 300 [Urine:300] ? ?Physical Exam ?Vitals reviewed.  ?Constitutional:   ?   Comm

## 2021-04-19 NOTE — Plan of Care (Signed)
  Problem: Nutrition: Goal: Risk of aspiration will decrease Outcome: Progressing   

## 2021-04-19 NOTE — Progress Notes (Signed)
Physical Therapy Treatment ?Patient Details ?Name: Mariah Henderson ?MRN: [id number] ?DOB: 1950/08/28 ?Today's Date: 04/19/2021 ? ? ?History of Present Illness Pt. is a 71 y.o. F. presenting [facility] on 3/29 after being found unresponsive at home. Only change in her routine was taking a new cancer medication Garey Ham) and not taking her morning morphine. CT shows possible R pons infarct and MRI shows no acute infarct, both show age related cerebral atrophy. CXR shows Mixed interstitial and airspace opacities bilaterally, likely a  combination pulmonary edema and underlying metastatic disease. Pt also has an unstagable pressure wound on her L hip. 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 ? ?  ?PT Comments  ? ? Pt with very flat affect, delayed processing and command following today. Pt more lethargic than last visit, per sister at bedside pt is like this (lethargic, myoclonic jerking, flat) when receives morphine. Pt tolerated transfer training in and out of bed, requires more multimodal, simple cuing this session versus previously again suspect due to medication. Plan remains appropriate at this time. ? ?  ?Recommendations for follow up therapy are one component of a multi-disciplinary discharge planning process, led by the attending physician.  Recommendations may be updated based on patient status, additional functional criteria and insurance authorization. ? ?Follow Up Recommendations ? Skilled nursing-short term rehab (<3 hours/day) ?  ?  ?Assistance Recommended at Discharge Frequent or constant Supervision/Assistance  ?Patient can return home with the following A little help with bathing/dressing/bathroom;Direct supervision/assist for medications management;Assist for transportation;Assistance with cooking/housework;A lot of help with walking and/or transfers ?  ?Equipment Recommendations ? None recommended by PT  ?  ?Recommendations for Other  Services   ? ? ?  ?Precautions / Restrictions Precautions ?Precautions: Fall;Other (comment) ?Precaution Comments: watch 02/HR ?Restrictions ?Weight Bearing Restrictions: No  ?  ? ?Mobility ? Bed Mobility ?Overal bed mobility: Needs Assistance ?Bed Mobility: Sit to Supine ?  ?  ?  ?Sit to supine: Mod assist ?  ?General bed mobility comments: assist for LE lifting into room, boost up in bed with PT encouraging pt to bridge hips to assist. ?  ? ?Transfers ?Overall transfer level: Needs assistance ?Equipment used: Rolling walker (2 wheels) ?Transfers: Sit to/from Stand, Bed to chair/wheelchair/BSC ?Sit to Stand: Min assist (min assist from EOB with BUEs on walker instead of pushing up from the surface.) ?  ?Step pivot transfers: Mod assist ?  ?  ?  ?General transfer comment: min-mod assist for stand, pivotal steps to/from chair towards pt's L to steady, guide pt/RW. ?  ? ?Ambulation/Gait ?  ?  ?  ?  ?  ?  ?  ?General Gait Details: unable given level of arousal ? ? ?Stairs ?  ?  ?  ?  ?  ? ? ?Wheelchair Mobility ?  ? ?Modified Rankin (Stroke Patients Only) ?Modified Rankin (Stroke Patients Only) ?Pre-Morbid Rankin Score: Slight disability ?Modified Rankin: Moderately severe disability ? ? ?  ?Balance Overall balance assessment: Needs assistance ?Sitting-balance support: Feet supported, No upper extremity supported ?Sitting balance-Leahy Scale: Good ?  ?Postural control: Posterior lean ?Standing balance support: Bilateral upper extremity supported, During functional activity ?Standing balance-Leahy Scale: Poor ?Standing balance comment: Pt with increased posterior lean and LOB in standing. ?  ?  ?  ?  ?  ?  ?  ?  ?  ?  ?  ?  ? ?  ?Cognition Arousal/Alertness: Lethargic, Suspect due to medications ?Behavior During  Therapy: Flat affect ?Overall Cognitive Status: Impaired/Different from baseline ?Area of Impairment: Orientation, Attention, Memory, Following commands, Safety/judgement, Awareness, Problem solving ?  ?  ?  ?   ?  ?  ?  ?  ?Orientation Level: Time, Situation ?Current Attention Level: Focused ?Memory: Decreased short-term memory ?Following Commands: Follows one step commands inconsistently ?Safety/Judgement: Decreased awareness of safety, Decreased awareness of deficits ?Awareness: Intellectual ?Problem Solving: Slow processing, Decreased initiation, Difficulty sequencing, Requires verbal cues, Requires tactile cues ?General Comments: Pt requires very short verbal cues, usually needs to be addressed by name to know command is for her. Per pt's sister at bedside, pt is lethargic like this when on morphine, RN notified. ?  ?  ? ?  ?Exercises General Exercises - Lower Extremity ?Long Arc Quad: AAROM, Both, 10 reps, Seated ? ?  ?General Comments   ?  ?  ? ?Pertinent Vitals/Pain Pain Assessment ?Pain Assessment: Faces ?Faces Pain Scale: No hurt ?Pain Intervention(s): Monitored during session  ? ? ?Home Living   ?  ?  ?  ?  ?  ?  ?  ?  ?  ?   ?  ?Prior Function    ?  ?  ?   ? ?PT Goals (current goals can now be found in the care plan section) Acute Rehab PT Goals ?PT Goal Formulation: With patient ?Time For Goal Achievement: 04/28/21 ?Potential to Achieve Goals: Good ?Progress towards PT goals: Progressing toward goals (but limited suspect due to medication today) ? ?  ?Frequency ? ? ? Min 2X/week ? ? ? ?  ?PT Plan Current plan remains appropriate  ? ? ?Co-evaluation   ?  ?  ?  ?  ? ?  ?AM-PAC PT "6 Clicks" Mobility   ?Outcome Measure ? Help needed turning from your back to your side while in a flat bed without using bedrails?: A Little ?Help needed moving from lying on your back to sitting on the side of a flat bed without using bedrails?: A Lot ?Help needed moving to and from a bed to a chair (including a wheelchair)?: A Lot ?Help needed standing up from a chair using your arms (e.g., wheelchair or bedside chair)?: A Lot ?Help needed to walk in hospital room?: Total ?Help needed climbing 3-5 steps with a railing? : Total ?6  Click Score: 11 ? ?  ?End of Session   ?Activity Tolerance: Patient limited by lethargy ?Patient left: in bed;with call bell/phone within reach;with bed alarm set;with family/visitor present ?Nurse Communication: Mobility status;Other (comment) (AMS which is not pt baseline, pt's sister suspects due to morphine given yesterday afternoon and evening) ?PT Visit Diagnosis: Other abnormalities of gait and mobility (R26.89) ?  ? ? ?Time: 4098-1191 ?PT Time Calculation (min) (ACUTE ONLY): 17 min ? ?Charges:  $Therapeutic Activity: 8-22 mins          ?          ? ?Marye Round, PT DPT ?Acute Rehabilitation Services ?Pager [phone]  ?Office [phone] ? ? ? ?Brenin Heidelberger E Stroup ?04/19/2021, 12:06 PM ? ?

## 2021-04-20 LAB — GLUCOSE, CAPILLARY
Glucose-Capillary: 119 mg/dL — ABNORMAL HIGH (ref 70–99)
Glucose-Capillary: 120 mg/dL — ABNORMAL HIGH (ref 70–99)
Glucose-Capillary: 121 mg/dL — ABNORMAL HIGH (ref 70–99)

## 2021-04-20 LAB — CULTURE, BLOOD (ROUTINE X 2)
Culture: NO GROWTH
Culture: NO GROWTH
Special Requests: ADEQUATE
Special Requests: ADEQUATE

## 2021-04-20 LAB — CBC
HCT: 34.9 % — ABNORMAL LOW (ref 36.0–46.0)
Hemoglobin: 10.8 g/dL — ABNORMAL LOW (ref 12.0–15.0)
MCH: 24.4 pg — ABNORMAL LOW (ref 26.0–34.0)
MCHC: 30.9 g/dL (ref 30.0–36.0)
MCV: 79 fL — ABNORMAL LOW (ref 80.0–100.0)
Platelets: 150 10*3/uL (ref 150–400)
RBC: 4.42 MIL/uL (ref 3.87–5.11)
RDW: 28.2 % — ABNORMAL HIGH (ref 11.5–15.5)
WBC: 25.9 10*3/uL — ABNORMAL HIGH (ref 4.0–10.5)
nRBC: 0 % (ref 0.0–0.2)

## 2021-04-20 MED ORDER — HALOPERIDOL LACTATE 5 MG/ML IJ SOLN: 0.5000 mg | INTRAMUSCULAR | Status: DC | PRN

## 2021-04-20 MED ORDER — ACETAMINOPHEN 325 MG PO TABS: 650.0000 mg | ORAL_TABLET | Freq: Four times a day (QID) | ORAL | Status: DC | PRN

## 2021-04-20 MED ORDER — GLYCOPYRROLATE 0.2 MG/ML IJ SOLN: 0.2000 mg | INTRAMUSCULAR | Status: DC | PRN

## 2021-04-20 MED ORDER — LORAZEPAM 2 MG/ML IJ SOLN: 1.0000 mg | INTRAMUSCULAR | Status: DC | PRN

## 2021-04-20 MED ORDER — ONDANSETRON 4 MG PO TBDP: 4.0000 mg | ORAL_TABLET | Freq: Four times a day (QID) | ORAL | Status: DC | PRN

## 2021-04-20 MED ORDER — LORAZEPAM 2 MG/ML PO CONC: 1.0000 mg | ORAL | Status: DC | PRN

## 2021-04-20 MED ORDER — ORAL CARE MOUTH RINSE: 15.0000 mL | OROMUCOSAL | Status: DC | PRN

## 2021-04-20 MED ORDER — HALOPERIDOL 0.5 MG PO TABS: 0.5000 mg | ORAL_TABLET | ORAL | Status: DC | PRN

## 2021-04-20 MED ORDER — BIOTENE DRY MOUTH MT LIQD: 15.0000 mL | OROMUCOSAL | Status: DC | PRN

## 2021-04-20 MED ORDER — LORAZEPAM 1 MG PO TABS: 1.0000 mg | ORAL_TABLET | ORAL | Status: DC | PRN

## 2021-04-20 MED ORDER — GLYCOPYRROLATE 1 MG PO TABS
1.0000 mg | ORAL_TABLET | ORAL | Status: DC | PRN
  Filled 2021-04-20: qty 1

## 2021-04-20 MED ORDER — HALOPERIDOL LACTATE 2 MG/ML PO CONC
0.5000 mg | ORAL | Status: DC | PRN
  Filled 2021-04-20: qty 0.3

## 2021-04-20 MED ORDER — ONDANSETRON HCL 4 MG/2ML IJ SOLN: 4.0000 mg | Freq: Four times a day (QID) | INTRAMUSCULAR | Status: DC | PRN

## 2021-04-20 MED ORDER — HALOPERIDOL LACTATE 2 MG/ML PO CONC: 0.5000 mg | ORAL | Status: DC | PRN

## 2021-04-20 MED ORDER — ALTEPLASE 2 MG IJ SOLR
2.0000 mg | Freq: Once | INTRAMUSCULAR | Status: DC
  Filled 2021-04-20: qty 2

## 2021-04-20 MED ORDER — ACETAMINOPHEN 650 MG RE SUPP: 650.0000 mg | Freq: Four times a day (QID) | RECTAL | Status: DC | PRN

## 2021-04-20 MED ORDER — MORPHINE SULFATE (PF) 2 MG/ML IV SOLN
1.0000 mg | INTRAVENOUS | Status: DC | PRN
  Administered 2021-04-21: 1 mg via INTRAVENOUS
  Filled 2021-04-20: qty 1

## 2021-04-20 MED ORDER — POLYVINYL ALCOHOL 1.4 % OP SOLN
1.0000 [drp] | Freq: Four times a day (QID) | OPHTHALMIC | Status: DC | PRN
  Filled 2021-04-20: qty 15

## 2021-04-20 NOTE — Plan of Care (Signed)
?  Problem: Nutrition: ?Goal: Risk of aspiration will decrease ?Outcome: Progressing ?  ?Problem: Activity: ?Goal: Risk for activity intolerance will decrease ?Outcome: Progressing ?  ?Problem: Nutrition: ?Goal: Adequate nutrition will be maintained ?Outcome: Progressing ?  ?

## 2021-04-20 NOTE — Progress Notes (Signed)
?Daily Progress Note  ? ?Patient Name: Mariah Henderson       Date: 04/20/2021 ?DOB: Feb 01, 1950  Age: 71 y.o. MRN#: [id number] ?Attending Physician: Charise Killian, MD ?Primary Care Physician: Sid Falcon, MD ?Admit Date: 04/13/2021 ?Length of Stay: 5 days ? ?Reason for Consultation/Follow-up: Establishing goals of care ? ?HPI/Patient Profile:  71 y.o. 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 Family felt patient's conversation was becoming abnormal and sister went to check on her, found unresponsive in the bed. Brought to the ER as Code Stroke. Recently started new medication (5th line treatment for pancreatic CA), noted confused. Brain imaging less suspicious for CVA. She was admitted on 8/89/1694 with Metabolic encephalopathy, Stage IV pancreatic CA (mets to liver and lungs, progressive), and others.  ?  ?PMT consulted for [place] discussions. Of note, has recently seen [name], NP in the [place] clinic on 04/06/21. Made adjustments to pain regimen for increasing pain likely due to liver tumor burden, medication adjustments for constipation, began discussions on [place] but no official decisions made that day. ? ?PMT consulted for [place] conversations. ? ?Subjective:  ? ?Subjective: ?Chart Reviewed. Updates received. Patient Assessed. Created space and opportunity for patient  and family to explore thoughts and feelings regarding current medical situation. ? ?Today's Discussion: Today I met with the patient at the bedside.  I was joined by Dr. Kai Levins (Psych resident/palliative rotation), Dr. Burr Medico (Oncology), Dr. Howie Ill with internal medicine, and others.  The patient's niece (primary local care person) [name] was on the phone and sister was also present at the bedside.  We had a significant discussion about the patient's current clinical  decline.  We discussed that morphine has not been given in approximately 36 to 48 hours and this is not likely causing her continued confusion, somnolence.  Dr. Burr Medico gave the opinion that she feels this is like to the progression of her cancer disease.  Given the significant progression, clinical decline, she does not feel that the fifth line treatment option she has been taking will be effective.  She strongly recommended residential hospice care (family mentioned Optometrist).   ? ?I explained the hospice philosophy of shifting our focus from trying to cure the incurable to promoting comfort, quality, dignity as we approached the end of life.  The patient's sister shares that she is just upset that it is come to this.  We celebrated that the patient has fought a hard fight against cancer but unfortunately has not been able to make substantial progress to cancer disease.  She is more confused now.  She told the oncologist she is not hurting, but told me that she is hurting.  The patient's niece [name] and the patient's sister agreed to hospice consult.  I discussed making the patient comfort care in the interim while we await hospice evaluation and bed availability.  They have both agreed.  I offered to call the patient's son as well but the patient's niece [name] said that she will call and explained to him as they have a very close relationship.  I offered that they can call at anytime if they have any questions or concerns.  She verbalized understanding. ? ?I provided emotional general supportive therapeutic listening, empathy, sharing of stories, and other techniques.  Answered all questions and addressed all concerns to the best my ability. ? ? ?  PREVIOUSLY: ?I DISCUSSED CERTAIN ASPECTS OF A MOST form on 4/1. The patient and family outlined their wishes for the following treatment decisions: ? ?Cardiopulmonary Resuscitation: Do Not Attempt Resuscitation (DNR/No CPR)  ?Feeding Tube: No feeding tube   ? ? ? ?Review of Systems  ?Constitutional:   ?     "I'm doing ok" with no general complaints  ?Respiratory:  Negative for cough and shortness of breath.   ?Gastrointestinal:  Negative for abdominal pain.  ?Musculoskeletal:   ?     Did ambulate with PT yesterday  ? ?Objective:  ? ?Vital Signs:  ?BP 127/76 (BP Location: Right Arm)   Pulse (!) 107   Temp 98.7 ?F (37.1 ?C) (Oral)   Resp 16   Ht 5' (1.524 m)   Wt 51.6 kg   LMP 04/29/1966 (LMP Unknown)   SpO2 97%   BMI 22.22 kg/m?  ? ?Physical Exam: ?Physical Exam ?Vitals and nursing note reviewed.  ?Constitutional:   ?   General: She is not in acute distress. ?   Appearance: She is ill-appearing.  ?HENT:  ?   Head: Normocephalic and atraumatic.  ?Pulmonary:  ?   Effort: Pulmonary effort is normal. No respiratory distress.  ?Abdominal:  ?   General: Abdomen is flat.  ?Skin: ?   General: Skin is warm and dry.  ?Neurological:  ?   Mental Status: She is alert.  ?Psychiatric:     ?   Mood and Affect: Mood normal.     ?   Behavior: Behavior normal.  ? ? ?Palliative Assessment/Data: 50% ? ? ?Assessment & Plan:  ? ?Impression: ?Present on Admission: ? Metabolic encephalopathy ? ?71 year old female with stage IV pancreatic cancer with progressive mets to liver and lung, worsening of her disease burden despite oncology treatment and is currently on fifth line treatment (not chemotherapy oral medication).  She presented with confusion and work-up is ongoing as to the etiology, likely multifactorial given progressive disease, dehydration, elevated white count. Also with new positive blood cultures, planned start vancomycin.  EEG with global changes, nothing acute.  Brain imaging did not show acute stroke to explain her confusion.  She has begun goals of care conversation with her family.  She did elect DNR yesterday and confirmed this as well as understanding of what this means.  She seems to have a firm grasp on her clinical situation, even though she is confused to the  year.  Poor overall prognosis.  After family meeting confirmed DNR status and no desire for feeding tubes in the future.  Further goals of care discussions to be determined at/after oncology follow-up in 1 to 2 weeks. ? ?SUMMARY OF RECOMMENDATIONS   ?Remain DNR ?Consult residential Hospice ([place]) ?Comfort Care while inpatient ?Continued spiritual care of patient and family ?PMT will continue to follow ? ?Symptom Management:  ?Tylenol 650 mg p.o. or PR every 6 hours as needed mild pain or fever ?Robinul 0.2 mg IV every 4 hours as needed excessive secretions ?Haldol 0.5 mg IV every 4 hours as needed agitation or delirium ?Lorazepam 1 mg IV every 4 hours as needed anxiety ?Zofran 4 mg IV every 6 hours as needed nausea ?Polyvinyl alcohol ophthalmic solution 1 drop OU 4 times daily as needed dry eyes ?Morphine 1 mg every 2 hours as needed severe pain or dyspnea ? ?Code Status: DNR ? ?Prognosis: Unable to determine ? ?Discharge Planning: To Be Determined ? ?Discussed with: Patient, medical team, nursing team, PT team ? ?Billing based on: Time ? ?  Billing based on MDM: High ? ?Problems Addressed: One acute or chronic illness or injury that poses a threat to life or bodily function ? ?Amount and/or Complexity of Data: Category 3:Discussion of management or test interpretation with external physician/other qualified health care professional/appropriate source (not separately reported) ? ?Risks: Parenteral controlled substances and Decision not to resuscitate or to de-escalate care because of poor prognosis  ? ?[name], NP ?Palliative Medicine Team ? ?Team Phone # [phone] (Nights/Weekends) ? 09/14/2020, 8:17 AM  ? ?

## 2021-04-20 NOTE — Progress Notes (Signed)
Speech Language Pathology Treatment: Dysphagia  ?Patient Details ?Name: Mariah Henderson ?MRN: [id number] ?DOB: [birth year] ?Today's Date: 04/20/2021 ?Time: 9292-4462 ?SLP Time Calculation (min) (ACUTE ONLY): 15 min ? ?Assessment / Plan / Recommendation ?Clinical Impression ? Ms. Defino was seen at bedside for skilled SLP intervention. Session primarily focused on dysphagia as pt has reportedly been aversive to PO trials. Pt more alert and agreed to ST services. Prior to initiating PO trials, SLP observed pt had orange flaky oral residue. SLP facilitated oral care regimen to pt in order to reduce oral bacteria load prior to PO trials. Pt tolerated oral care regimen, however required mod assist in order to spit out wash. SLP administered individual straw sips of thin liquid to the pt and she accepted x3 with no overt s/s of aspiration. SLP administered 1/2 tsp bites of applesauce, and pt displayed tongue thrust and verbalized that she did not want applesauce. SLP then tried incorporating high-flavor preferred chocolate ice cream and pt engaged in licking ice cream off spoon x3. Pt refused any further PO trials. Swallowing appears grossly intact, with primary barrier to eating and drinking being decreased appetite and interest. Pt is on appropriate diet of Dysphagia 3 and thin liquids. This will allow her least restrictive diet to improve caloric intake, nutrition and hydration. Pt does not necessitate skilled SLP services at this time.  ?  ?HPI HPI: 71 yo female adm to [facility] with AMS - ? 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   Brain imaging negative for acute event. Pt failed [place] and swallow evaluation was ordered. ?  ?   ?SLP Plan ? All goals met ? ?  ?  ?Recommendations for follow up therapy are one component of a multi-disciplinary discharge planning process, led by the attending physician.   Recommendations may be updated based on patient status, additional functional criteria and insurance authorization. ?  ? ?Recommendations  ?Diet recommendations: Dysphagia 3 (mechanical soft);Thin liquid ?Liquids provided via: Straw ?Medication Administration: Whole meds with puree ?Supervision: Staff to assist with self feeding ?Compensations: Slow rate;Small sips/bites;Follow solids with liquid ?Postural Changes and/or Swallow Maneuvers: Seated upright 90 degrees  ?   ?    ?   ? ? ? ? Oral Care Recommendations: Oral care BID ?Follow Up Recommendations: No SLP follow up ?Assistance recommended at discharge: Frequent or constant Supervision/Assistance ?SLP Visit Diagnosis: Dysphagia, unspecified (R13.10) ?Plan: All goals met ? ? ? ? ?  ?  ? ? ?Vaughan Sine ? ?04/20/2021, 10:33 AM ?

## 2021-04-20 NOTE — Progress Notes (Addendum)
? LOS: 5 days  ?Patient Summary: Mariah Henderson is a 71 y.o. 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 ? ?Overnight Events: No acute overnight events. ? ?Interim History: Patient seen and evaluated at bedside by team alongside Oncology and Palliative Care. She was more alert today, but was minimally interactive. She endorsed abdominal pain. Patient's twin sister, Regino Schultze, was present at bedside and patient's niece [name] was updated over the phone.  ? ?Objective: ?Vital signs in last 24 hours: ?Vitals:  ? 04/20/21 0338 04/20/21 0736 04/20/21 1120 04/20/21 1527  ?BP: 129/72 136/76 (!) 153/90 127/76  ?Pulse: 97 (!) 109 (!) 107 (!) 107  ?Resp: '20 16 16 16  '$ ?Temp: 98.5 ?F (36.9 ?C) 98.6 ?F (37 ?C) 98.9 ?F (37.2 ?C) 98.7 ?F (37.1 ?C)  ?TempSrc: Oral Oral Oral Oral  ?SpO2: 97% 90% 100% 97%  ?Weight:      ?Height:      ? ?SpO2: 97 % ?O2 Flow Rate (L/min): 2 L/min ?FiO2 (%): 21 % ?Weight change:  ? ?Intake/Output Summary (Last 24 hours) at 04/20/2021 1629 ?Last data filed at 04/20/2021 1527 ?Gross per 24 hour  ?Intake 879.92 ml  ?Output 300 ml  ?Net 579.92 ml  ? ?Constitutional: Chronically-ill appearing. Withdrawn. Laying in bed with multiple blankets pulled up to her shoulders. Uncomfortable appearing and adjusts position often. ?Cardio: RRR. No murmurs, rubs, or gallops. ?Abdomen: Expresses pain on palpation.  ?MSK: Moves all extremities. ?Neuro: Awake and alert. No focal deficits noted. ? ?Lab Results: ? ?  Latest Ref Rng & Units 04/20/2021  ?  5:40 AM 04/19/2021  ?  5:00 AM 04/18/2021  ?  4:15 AM  ?CBC  ?WBC 4.0 - 10.5 K/uL 25.9   26.6   24.2    ?Hemoglobin 12.0 - 15.0 g/dL 10.8   11.1   11.7    ?Hematocrit 36.0 - 46.0 % 34.9   35.5   37.4    ?Platelets 150 - 400 K/uL 150   174   164    ? ?Medications: ?Scheduled Meds: ? alteplase  2 mg  Intracatheter Once  ? Chlorhexidine Gluconate Cloth  6 each Topical Daily  ? feeding supplement  237 mL Oral TID BM  ? leptospermum manuka honey  1 application. Topical Daily  ? mometasone-formoterol  2 puff Inhalation BID  ? senna-docusate  1 tablet Oral QHS  ? ?Continuous Infusions: ? ? ?PRN Meds:.acetaminophen **OR** acetaminophen, albuterol, antiseptic oral rinse, celecoxib, glycopyrrolate **OR** glycopyrrolate **OR** glycopyrrolate, haloperidol **OR** haloperidol **OR** haloperidol lactate, LORazepam **OR** LORazepam **OR** LORazepam, mouth rinse, morphine injection, ondansetron **OR** ondansetron (ZOFRAN) IV, polyethylene glycol, polyvinyl alcohol, white petrolatum ?Assessment/Plan: ?Principal Problem: ?  Metabolic encephalopathy ?Active Problems: ?  Pressure injury of skin ?  Metastatic malignant neoplasm ([place]) ?  Protein-calorie malnutrition, severe ? 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 Discussed with family that this is progression of malignancy. Patient remains afebrile and hemodynamically stable. She has not received morphine since Monday evening, as patient's family was concerned of increased somnolence. She endorsed abdominal pain and was observe to be uncomfortable on exam. Oncology recommended placement at hospice with shift to comfort care due to progression of malignancy and decline in patient's status. Patient's niece and sister were ok with this  transition. ?--Appreciate oncology recommendation.  ?--Comfort measures are in place ?--Palliative care assisting with hospice placement ?  ?Diet: Dysphagia 3, anything as tolerated ?VTE: none ?IVF: None ?Code: DNR ? ?Prior to Admission Living Arrangement: home ?Anticipated Discharge Location: Hospice ?Barriers to Discharge: Hospice placement ?Dispo: Anticipated discharge in approximately 1-2 day(s).  ? ?This is a Careers information officer Note.   The care of the patient was discussed with Dr. Howie Ill and the assessment and plan formulated with their assistance.  Please see their attached note for official documentation of the daily encounter. ? ?Christiana Fuchs, DO ?04/20/2021, 4:29 PM ?Pager: [phone] ?After 5pm on weekdays and 1pm on weekends: On Call pager [phone] ? ? ?

## 2021-04-21 MED ORDER — HALOPERIDOL LACTATE 5 MG/ML IJ SOLN: 0.5000 mg | INTRAMUSCULAR | Status: AC | PRN

## 2021-04-21 MED ORDER — ONDANSETRON HCL 4 MG/2ML IJ SOLN
4.0000 mg | Freq: Four times a day (QID) | INTRAMUSCULAR | 0 refills | Status: AC | PRN
Start: 2021-04-21 — End: ?

## 2021-04-21 MED ORDER — POLYETHYLENE GLYCOL 3350 17 G PO PACK: 17.0000 g | PACK | Freq: Every day | ORAL | 0 refills | Status: AC | PRN

## 2021-04-21 MED ORDER — LORAZEPAM 1 MG PO TABS: 1.0000 mg | ORAL_TABLET | ORAL | 0 refills | Status: AC | PRN

## 2021-04-21 MED ORDER — GLYCOPYRROLATE 0.2 MG/ML IJ SOLN: 0.2000 mg | INTRAMUSCULAR | Status: AC | PRN

## 2021-04-21 MED ORDER — MORPHINE SULFATE (PF) 2 MG/ML IV SOLN: 1.0000 mg | INTRAVENOUS | 0 refills | Status: AC | PRN

## 2021-04-21 MED ORDER — SENNOSIDES-DOCUSATE SODIUM 8.6-50 MG PO TABS: 1.0000 | ORAL_TABLET | Freq: Every day | ORAL | Status: AC

## 2021-04-21 MED ORDER — LORAZEPAM 2 MG/ML PO CONC
1.0000 mg | ORAL | 0 refills | Status: AC | PRN
Start: 2021-04-21 — End: ?

## 2021-04-21 MED ORDER — ACETAMINOPHEN 650 MG RE SUPP: 650.0000 mg | Freq: Four times a day (QID) | RECTAL | 0 refills | Status: AC | PRN

## 2021-04-21 MED ORDER — ENSURE ENLIVE PO LIQD: 237.0000 mL | Freq: Three times a day (TID) | ORAL | 12 refills | Status: AC

## 2021-04-21 MED ORDER — HALOPERIDOL 0.5 MG PO TABS: 0.5000 mg | ORAL_TABLET | ORAL | Status: AC | PRN

## 2021-04-21 MED ORDER — CHLORHEXIDINE GLUCONATE CLOTH 2 % EX PADS
6.0000 | MEDICATED_PAD | Freq: Every day | CUTANEOUS | Status: AC
Start: 2021-04-21 — End: ?

## 2021-04-21 MED ORDER — GLYCOPYRROLATE 1 MG PO TABS: 1.0000 mg | ORAL_TABLET | ORAL | Status: AC | PRN

## 2021-04-21 MED ORDER — ONDANSETRON 4 MG PO TBDP: 4.0000 mg | ORAL_TABLET | Freq: Four times a day (QID) | ORAL | 0 refills | Status: AC | PRN

## 2021-04-21 MED ORDER — WHITE PETROLATUM EX OINT: 1.0000 "application " | TOPICAL_OINTMENT | CUTANEOUS | 0 refills | Status: AC | PRN

## 2021-04-21 MED ORDER — HALOPERIDOL LACTATE 2 MG/ML PO CONC
0.5000 mg | ORAL | 0 refills | Status: AC | PRN
Start: 2021-04-21 — End: ?

## 2021-04-21 NOTE — Progress Notes (Addendum)
Manufacturing engineer [facility]) Hospital Liaison Note ? ?Referral received for patient/family interest in [facility]. Chart under review by [facility] physician.  ? ?Hospice eligibility confirmed.  ? ?Bed offered and accepted at [facility] for transfer today.  ? ?Unit RN please call report to [phone] prior to patient leaving the unit. Please send signed DNR and paperwork with patient.  ? ?Please call with any questions or concerns. Thank you ? ?Roselee Nova, LCSW ?[facility] Liaison ?[phone] ?

## 2021-04-21 NOTE — Progress Notes (Signed)
Called and spoke with patient's son, [name], who is patient's next of kin.  He states that he was able to speak with [name] and understands why hospice is recommended.  He agrees that inpatient hospice is the best next step for his mother.   ?

## 2021-04-21 NOTE — Discharge Summary (Addendum)
? ?Name: Mariah Henderson ?MRN: [id number] ?DOB: [birth year] 71 y.o. ?PCP: Sid Falcon, MD ? ?Date of Admission: 04/13/2021  1:25 PM ?Date of Discharge: 04/21/21 ?Attending Physician: Charise Killian, MD ? ?Discharge Diagnosis: ?1.  Pancreatic cancer with liver and lung metastasis, stage V ?2.  Acute encephalopathy ?3.  Constipation ? ?Discharge Medications: ?Allergies as of 04/21/2021   ?No Known Allergies ?  ? ?  ?Medication List  ?  ? ?STOP taking these medications   ? ?Accu-Chek Softclix Lancets lancets ?  ?albuterol (2.5 MG/3ML) 0.083% nebulizer solution ?Commonly known as: PROVENTIL ?  ?albuterol 108 (90 Base) MCG/ACT inhaler ?Commonly known as: VENTOLIN HFA ?  ?alendronate 70 MG tablet ?Commonly known as: FOSAMAX ?  ?atorvastatin 40 MG tablet ?Commonly known as: LIPITOR ?  ?benazepril 20 MG tablet ?Commonly known as: LOTENSIN ?  ?cholecalciferol 1000 units tablet ?Commonly known as: VITAMIN D ?  ?diclofenac Sodium 1 % Gel ?Commonly known as: VOLTAREN ?  ?famotidine 40 MG tablet ?Commonly known as: PEPCID ?  ?fluticasone 50 MCG/ACT nasal spray ?Commonly known as: FLONASE ?  ?Fluticasone-Salmeterol 100-50 MCG/DOSE Aepb ?Commonly known as: Advair Diskus ?  ?furosemide 20 MG tablet ?Commonly known as: LASIX ?  ?gabapentin 100 MG capsule ?Commonly known as: NEURONTIN ?  ?gabapentin 300 MG capsule ?Commonly known as: NEURONTIN ?  ?ipratropium 0.02 % nebulizer solution ?Commonly known as: ATROVENT ?  ?latanoprost 0.005 % ophthalmic solution ?Commonly known as: XALATAN ?  ?loratadine 10 MG tablet ?Commonly known as: CLARITIN ?  ?olaparib 150 MG tablet ?Commonly known as: LYNPARZA ?  ?potassium chloride SA 20 MEQ tablet ?Commonly known as: KLOR-CON M ?  ?rivaroxaban 20 MG Tabs tablet ?Commonly known as: XARELTO ?  ?sertraline 50 MG tablet ?Commonly known as: ZOLOFT ?  ?triamcinolone cream 0.1 % ?Commonly known as: KENALOG ?  ? ?  ? ?TAKE these medications   ? ?acetaminophen 650 MG suppository ?Commonly known as: TYLENOL ?Place  1 suppository (650 mg total) rectally every 6 (six) hours as needed for mild pain (or Fever >/= 101). ?  ?celecoxib 100 MG capsule ?Commonly known as: CELEBREX ?TAKE 1 CAPSULE BY MOUTH  TWICE DAILY ?What changed:  ?how much to take ?how to take this ?when to take this ?reasons to take this ?additional instructions ?  ?Chlorhexidine Gluconate Cloth 2 % Pads ?Apply 6 each topically daily. ?  ?diphenoxylate-atropine 2.5-0.025 MG tablet ?Commonly known as: Lomotil ?Take 1 tablet by mouth 4 (four) times daily as needed for diarrhea or loose stools. ?  ?feeding supplement Liqd ?Take 237 mLs by mouth 3 (three) times daily between meals. ?  ?glycopyrrolate 1 MG tablet ?Commonly known as: ROBINUL ?Take 1 tablet (1 mg total) by mouth every 4 (four) hours as needed (excessive secretions). ?  ?glycopyrrolate 0.2 MG/ML injection ?Commonly known as: ROBINUL ?Inject 1 mL (0.2 mg total) into the skin every 4 (four) hours as needed (excessive secretions). ?  ?glycopyrrolate 0.2 MG/ML injection ?Commonly known as: ROBINUL ?Inject 1 mL (0.2 mg total) into the vein every 4 (four) hours as needed (excessive secretions). ?  ?haloperidol 0.5 MG tablet ?Commonly known as: HALDOL ?Take 1 tablet (0.5 mg total) by mouth every 4 (four) hours as needed for agitation (or delirium). ?  ?haloperidol lactate 5 MG/ML injection ?Commonly known as: HALDOL ?Inject 0.1 mLs (0.5 mg total) into the vein every 4 (four) hours as needed (or delirium). ?  ?haloperidol 2 MG/ML solution ?Commonly known as: HALDOL ?Place 0.3 mLs (0.6 mg total) under  the tongue every 4 (four) hours as needed for agitation (or delirium). ?  ?loperamide 2 MG tablet ?Commonly known as: Loperamide A-D ?Take 1 tablet (2 mg total) by mouth 2 (two) times daily as needed for diarrhea or loose stools. ?  ?LORazepam 2 MG/ML concentrated solution ?Commonly known as: ATIVAN ?Place 0.5 mLs (1 mg total) under the tongue every 4 (four) hours as needed for anxiety. ?  ?LORazepam 1 MG  tablet ?Commonly known as: ATIVAN ?Take 1 tablet (1 mg total) by mouth every 4 (four) hours as needed for anxiety. ?  ?magic mouthwash w/lidocaine Soln ?Take 5 mLs by mouth 4 (four) times daily as needed for mouth pain. ?  ?meclizine 12.5 MG tablet ?Commonly known as: ANTIVERT ?TAKE 1 TABLET(12.5 MG) BY MOUTH TWICE DAILY AS NEEDED FOR DIZZINESS ?What changed:  ?how much to take ?how to take this ?when to take this ?reasons to take this ?additional instructions ?  ?morphine 15 MG 12 hr tablet ?Commonly known as: MS CONTIN ?Take 1 tablet (15 mg total) by mouth every 12 (twelve) hours. ?What changed: Another medication with the same name was added. Make sure you understand how and when to take each. ?  ?morphine (PF) 2 MG/ML injection ?Inject 0.5 mLs (1 mg total) into the vein every 2 (two) hours as needed (or dyspnea). ?What changed: You were already taking a medication with the same name, and this prescription was added. Make sure you understand how and when to take each. ?  ?ondansetron 4 MG disintegrating tablet ?Commonly known as: ZOFRAN-ODT ?Take 1 tablet (4 mg total) by mouth every 6 (six) hours as needed for nausea. ?  ?ondansetron 4 MG/2ML Soln injection ?Commonly known as: ZOFRAN ?Inject 2 mLs (4 mg total) into the vein every 6 (six) hours as needed for nausea. ?  ?Oxycodone HCl 10 MG Tabs ?Take 1 tablet (10 mg total) by mouth every 4 (four) hours as needed. ?  ?polyethylene glycol 17 g packet ?Commonly known as: MIRALAX / GLYCOLAX ?Take 17 g by mouth daily as needed for moderate constipation. ?  ?prochlorperazine 10 MG tablet ?Commonly known as: COMPAZINE ?TAKE 1 TABLET(10 MG) BY MOUTH EVERY 6 HOURS AS NEEDED FOR NAUSEA OR VOMITING ?What changed: See the new instructions. ?  ?senna-docusate 8.6-50 MG tablet ?Commonly known as: Senokot-S ?Take 1 tablet by mouth at bedtime. ?  ?traMADol 50 MG tablet ?Commonly known as: ULTRAM ?Take 1 tablet (50 mg total) by mouth every 8 (eight) hours as needed. ?  ?white  petrolatum Oint ?Commonly known as: VASELINE ?Apply 1 application. topically as needed for lip care (for lips). ?  ? ?  ? ? ?Disposition and follow-up:   ?Ms.Careli Luzader was discharged from [facility] in Stable condition.  At the hospital follow up visit please address: ? ?Discharging to [facility] for residential hospice. ? ? ?Hospital Course by problem list: ?#Comfort care measures ?#Pancreatic cancer with liver and lung metastases, stage IV ?#Leukocytosis ?Patient has history of stage IV pancreatic cancer with known metastases to liver and lung. Initial work-up showed elevated LFTs likely from liver metastasis, WBC at 15.6, CXR showed small bilateral pleural effusion, Echocardiogram showed grade 1 diastolic dysfunction with LVEF 65 to 70% with no wall motion abnormalities. CT head/neck showed multiple scattered pulmonary nodules within visualized lungs and 1.4cm soft tissue mass at the posterior aspect of right parotid gland. WBC trended up each day to 25.9 on 4/5. 1/4 blood cultures obtained on admission showed growth. IV  Vancomycin started. Culture positive for methicillin-resistant staphylococcus epidermidis more consistent with contamination and vancomycin discontinued. Repeat blood cultures negative. Patient remained afebrile. Palliative care consulted for goals of care due to concern that this is progression of cancer. Family meeting held on 4/1 to clarify goals of care. Confirmed DNAR/DNI status. Patient requested no feeding tube. Pain medication discontinued and changed to as needed due to concern of drowsiness. Patient was tachycardic in the setting of not receiving pain medication. Patient continued to have decreased functional status. Oncology, Dr.Feng who is patient's primary oncologist, and Palliative Care evaluated patient along with IM team. Discussed with family that leukocytosis and patient's decrease in function was likely progression of malignancy. Patient remains  afebrile, hemodynamically stable, and not showing signs of infection. Dr. Burr Medico discussed with [name] and Regino Schultze, patient's niece and sister, that this is progression of disease and she would not recommend further chemothe

## 2021-04-21 NOTE — Progress Notes (Signed)
?Daily Progress Note  ? ?Patient Name: Mariah Henderson       Date: 04/21/2021 ?DOB: 11-Feb-1950  Age: 71 y.o. MRN#: [id number] ?Attending Physician: Charise Killian, MD ?Primary Care Physician: Sid Falcon, MD ?Admit Date: 04/13/2021 ?Length of Stay: 6 days ? ?Reason for Consultation/Follow-up: Establishing goals of care ? ?HPI/Patient Profile:  71 y.o. 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 Family felt patient's conversation was becoming abnormal and sister went to check on her, found unresponsive in the bed. Brought to the ER as Code Stroke. Recently started new medication (5th line treatment for pancreatic CA), noted confused. Brain imaging less suspicious for CVA. She was admitted on 06/12/7822 with Metabolic encephalopathy, Stage IV pancreatic CA (mets to liver and lungs, progressive), and others.  ?  ?PMT consulted for [place] discussions. Of note, has recently seen [name], NP in the [place] clinic on 04/06/21. Made adjustments to pain regimen for increasing pain likely due to liver tumor burden, medication adjustments for constipation, began discussions on [place] but no official decisions made that day. ? ?PMT consulted for [place] conversations. ? ?Subjective:  ? ?Subjective: ?Chart Reviewed. Updates received. Patient Assessed. Created space and opportunity for patient  and family to explore thoughts and feelings regarding current medical situation. ? ?Today's Discussion: Today I saw the patient at the bedside.  She is a bit somnolent but does open her eyes to voice and touch.  Not very communicative.  Her sister and 2 other relatives are at the bedside.  We discussed hospice liaison evaluation and recommendation for approval of residential hospice and that they likely have a bed available today, awaiting on niece [name] to approve.  The  patient sister states that she just called her and told her about this and that they are excepting the bed offer.  I told her that we can anticipate likely transfer today given bed availability. ? ?We discussed how the family is coping with the current situation.  They are again upset and sad but they understand the current situation and not preventing her from suffering and caring for her the best way that we know how is paramount. ? ?I provided emotional general support through therapeutic listening, empathy, and other techniques.  Answered all questions and addressed all concerns to the best of my ability. ? ?I discussed the situation with internal medicine resident Dr. Howie Ill.  Hospice should be able to order appropriate medications upon excepting the patient and transferring her.  Also discussed confirming with the son that he is in agreement.  I heard back later from Dr. Howie Ill that she spoke with the son on the phone and he is in agreement with hospice and comfort measures.  We will continue comfort care until she is able to transfer. ? ?PREVIOUSLY: ?I DISCUSSED CERTAIN ASPECTS OF A MOST form on 4/1. The patient and family outlined their wishes for the following treatment decisions: ? ?Cardiopulmonary Resuscitation: Do Not Attempt Resuscitation (DNR/No CPR)  ?Feeding Tube: No feeding tube  ? ? ? ?Review of Systems  ?Constitutional:   ?     "I'm doing ok" with no general complaints  ?Respiratory:  Negative for cough and shortness of breath.   ?Gastrointestinal:  Negative for abdominal pain.  ?Musculoskeletal:   ?     Did ambulate with PT yesterday  ? ?Objective:  ? ?  Vital Signs:  ?BP (!) 153/80 (BP Location: Right Arm)   Pulse (!) 110   Temp 98.9 ?F (37.2 ?C) (Oral)   Resp (!) 22   Ht 5' (1.524 m)   Wt 51.6 kg   LMP 04/29/1966 (LMP Unknown)   SpO2 98%   BMI 22.22 kg/m?  ? ?Physical Exam: ?Physical Exam ?Vitals and nursing note reviewed.  ?Constitutional:   ?   General: She is not in acute distress. ?    Appearance: She is ill-appearing.  ?HENT:  ?   Head: Normocephalic and atraumatic.  ?Pulmonary:  ?   Effort: Pulmonary effort is normal. No respiratory distress.  ?Abdominal:  ?   General: Abdomen is flat.  ?Skin: ?   General: Skin is warm and dry.  ?Neurological:  ?   Mental Status: She is alert.  ?Psychiatric:     ?   Mood and Affect: Mood normal.     ?   Behavior: Behavior normal.  ? ? ?Palliative Assessment/Data: 50% ? ? ?Assessment & Plan:  ? ?Impression: ?Present on Admission: ? Metabolic encephalopathy ? ?71 year old female with stage IV pancreatic cancer with progressive mets to liver and lung, worsening of her disease burden despite oncology treatment and is currently on fifth line treatment (not chemotherapy oral medication).  She presented with confusion and work-up is ongoing as to the etiology, likely multifactorial given progressive disease, dehydration, elevated white count. Also with new positive blood cultures, planned start vancomycin.  EEG with global changes, nothing acute.  Brain imaging did not show acute stroke to explain her confusion.  She has begun goals of care conversation with her family.  She did elect DNR yesterday and confirmed this as well as understanding of what this means.  She seems to have a firm grasp on her clinical situation, even though she is confused to the year.  Poor overall prognosis.  After family meeting confirmed DNR status and no desire for feeding tubes in the future.  Further goals of care discussions to be determined at/after oncology follow-up in 1 to 2 weeks. ? ?SUMMARY OF RECOMMENDATIONS   ?Remain DNR ?Await transfer to [place] (anticipate today) ?Comfort Care while inpatient ?Continued spiritual care of patient and family ?PMT will continue to follow ? ?Symptom Management:  ?Tylenol 650 mg p.o. or PR every 6 hours as needed mild pain or fever ?Robinul 0.2 mg IV every 4 hours as needed excessive secretions ?Haldol 0.5 mg IV every 4 hours as needed  agitation or delirium ?Lorazepam 1 mg IV every 4 hours as needed anxiety ?Zofran 4 mg IV every 6 hours as needed nausea ?Polyvinyl alcohol ophthalmic solution 1 drop OU 4 times daily as needed dry eyes ?Morphine 1 mg every 2 hours as needed severe pain or dyspnea ? ?Code Status: DNR ? ?Prognosis: Unable to determine ? ?Discharge Planning: To Be Determined ? ?Discussed with: Patient, medical team, nursing team, PT team ? ?Billing based on: Time ? ?Billing based on MDM: High ? ?Problems Addressed: One acute or chronic illness or injury that poses a threat to life or bodily function ? ?Amount and/or Complexity of Data: Category 3:Discussion of management or test interpretation with external physician/other qualified health care professional/appropriate source (not separately reported) ? ?Risks: Parenteral controlled substances and Decision not to resuscitate or to de-escalate care because of poor prognosis  ? ?[name], NP ?Palliative Medicine Team ? ?Team Phone # [phone] (Nights/Weekends) ? 09/14/2020, 8:17 AM  ? ?

## 2021-04-21 NOTE — TOC Progression Note (Signed)
Transition of Care (TOC) - Progression Note  ? ? ?Patient Details  ?Name: Tiamarie Furnari ?MRN: [id number] ?Date of Birth: 01-Dec-1950 ? ?Transition of Care (TOC) CM/SW Contact  ?Pollie Friar, RN ?Phone Number: ?04/21/2021, 10:05 AM ? ?Clinical Narrative:    ?CM consulted for residential hospice at [facility]. CM has made the referral to [facility] with [facility]. CM following. ? ? ?Expected Discharge Plan: [place] ?Barriers to Discharge: Continued Medical Work up ? ?Expected Discharge Plan and Services ?Expected Discharge Plan: [place] ?In-house Referral: Clinical Social Work ?Discharge Planning Services: CM Consult ?Post Acute Care Choice: [place] ?Living arrangements for the past 2 months: Apartment ?                ?  ?  ?  ?  ?  ?  ?  ?  ?  ?  ? ? ?Social Determinants of Health (SDOH) Interventions ?  ? ?Readmission Risk Interventions ?   ? View : No data to display.  ?  ?  ?  ? ? ?

## 2021-04-21 NOTE — TOC Transition Note (Signed)
Transition of Care (TOC) - CM/SW Discharge Note ? ? ?Patient Details  ?Name: Maral Lampe ?MRN: [id number] ?Date of Birth: 11-Apr-1950 ? ?Transition of Care (TOC) CM/SW Contact:  ?Pollie Friar, RN ?Phone Number: ?04/21/2021, 2:21 PM ? ? ?Clinical Narrative:    ?Patient discharging to [facility] today. [facility] to provide transport. Bedside RN updated and d/c packet is at the desk. Sister at the bedside and updated.  ? ?Number for report; [phone] ? ? ?Final next level of care: [place] ?Barriers to Discharge: No Barriers Identified ? ? ?Patient Goals and CMS Choice ?  ?CMS Medicare.gov Compare Post Acute Care list provided to:: Patient Represenative (must comment) ?Choice offered to / list presented to : Sibling ? ?Discharge Placement ?  ?           ?  ?  ?  ?  ? ?Discharge Plan and Services ?In-house Referral: Clinical Social Work ?Discharge Planning Services: CM Consult ?Post Acute Care Choice: [place]          ?  ?  ?  ?  ?  ?  ?  ?  ?  ?  ? ?Social Determinants of Health (SDOH) Interventions ?  ? ? ?Readmission Risk Interventions ?   ? View : No data to display.  ?  ?  ?  ? ? ? ? ? ?

## 2021-04-25 ENCOUNTER — Other Ambulatory Visit: Payer: Medicare Other

## 2021-04-25 ENCOUNTER — Ambulatory Visit: Payer: Medicare Other | Admitting: Hematology

## 2021-04-25 ENCOUNTER — Encounter: Payer: Medicare Other | Admitting: Nurse Practitioner

## 2021-04-26 ENCOUNTER — Other Ambulatory Visit: Payer: Medicare Other | Admitting: Internal Medicine

## 2021-05-03 ENCOUNTER — Other Ambulatory Visit: Payer: Self-pay

## 2021-05-03 ENCOUNTER — Telehealth: Payer: Self-pay | Admitting: Hematology

## 2021-05-03 NOTE — Telephone Encounter (Signed)
I returned pt's niece Ashley's call today. I expressed my condolence to her and answered all her questions. She appreciated my call and expressed her satisfaction and appreciation for Ms Bonus's care from [place].  ? ?Truitt Merle  ?05/03/2021  ?

## 2021-05-16 DEATH — deceased

## 2022-09-09 IMAGING — US IR US GUIDANCE
1 series · 11 of 11 positions shown · non-contrast
Comparison: CT abdomen pelvis-01/13/2020

INDICATION: Concern for metastatic pancreatic cancer. Please perform
ultrasound-guided liver lesion biopsy for tissue diagnostic
purposes.

EXAM:
ULTRASOUND GUIDED LIVER LESION BIOPSY

[Series 1: (id) · 11 of 11 slices shown]
[im 1/11]
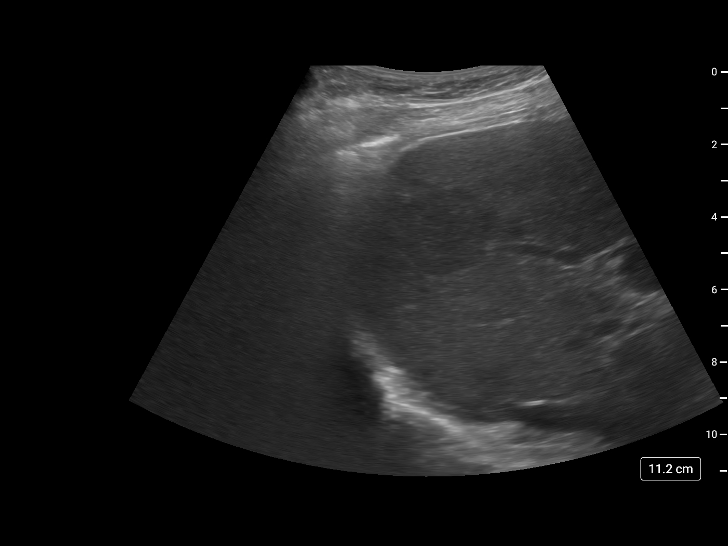
[im 2/11]
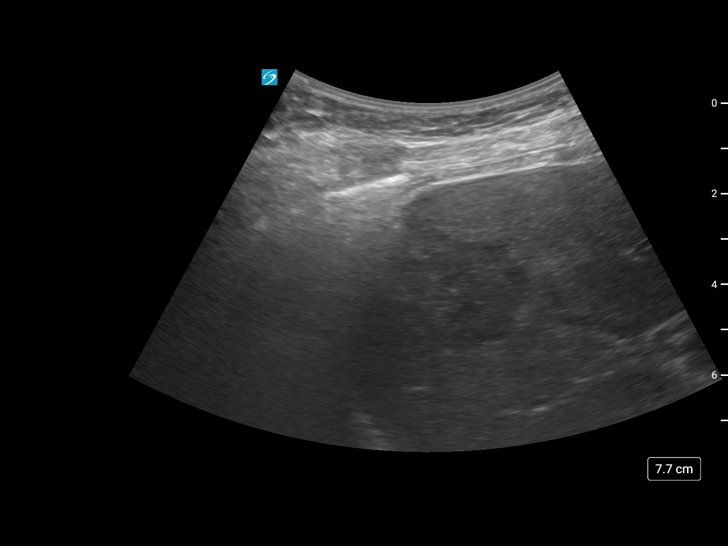
[im 3/11]
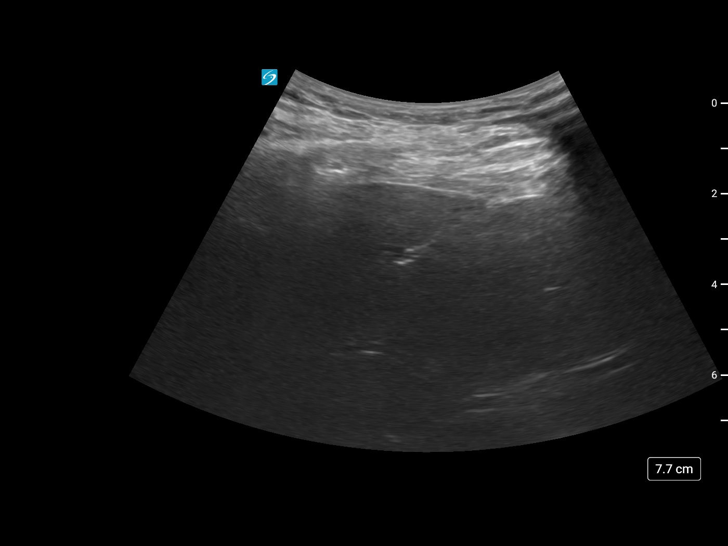
[im 4/11]
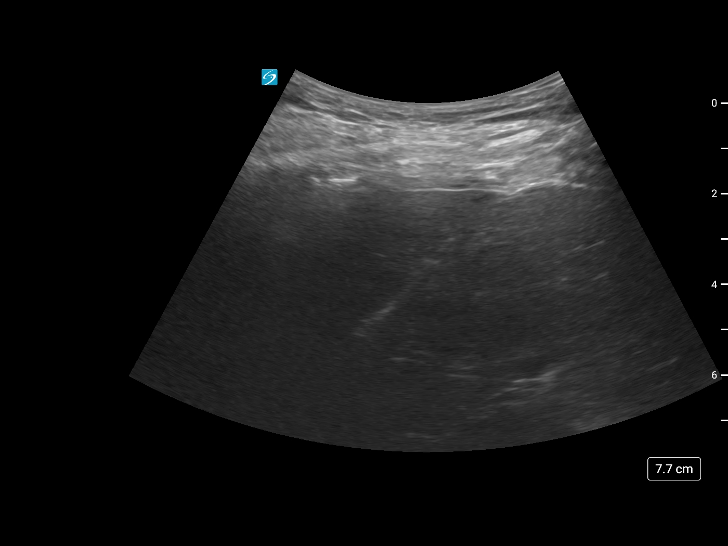
[im 5/11]
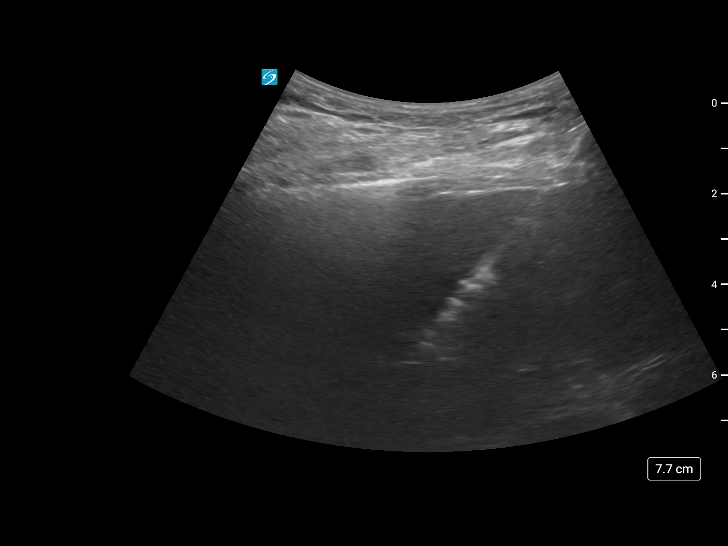
[im 6/11]
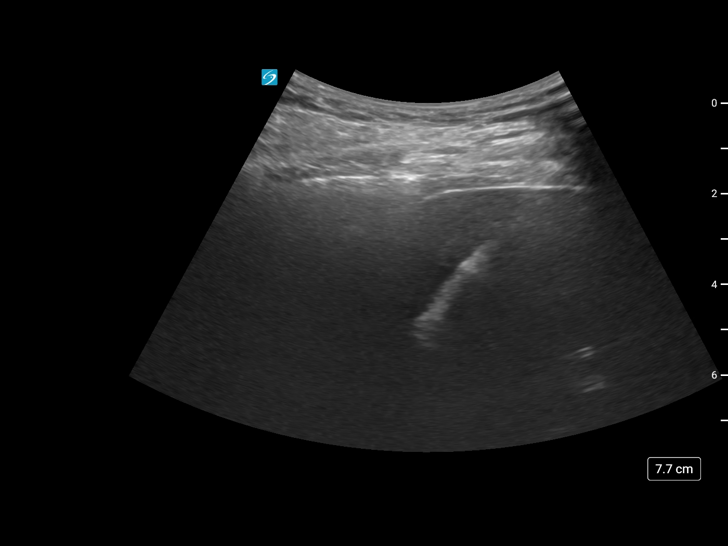
[im 7/11]
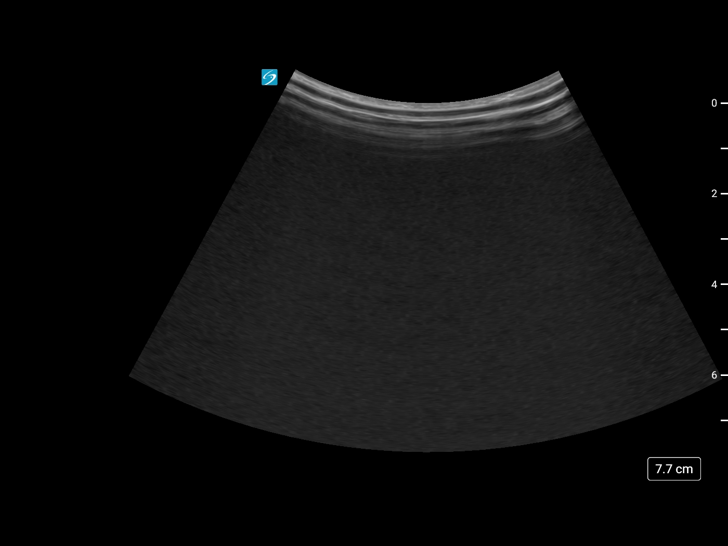
[im 8/11]
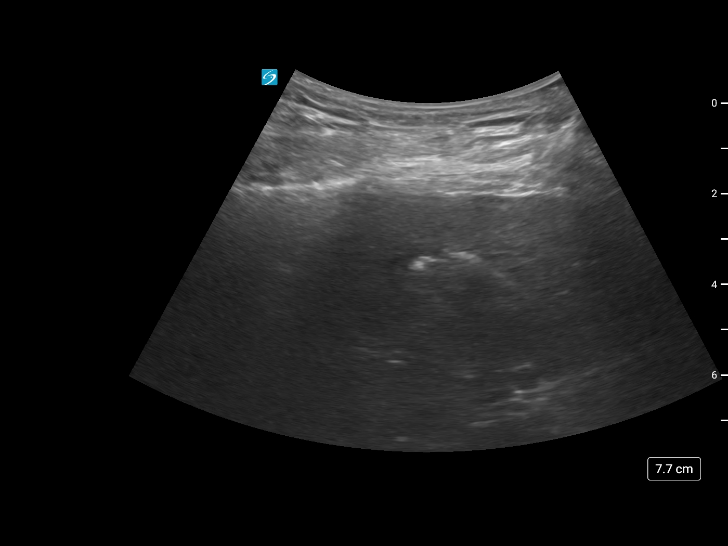
[im 9/11]
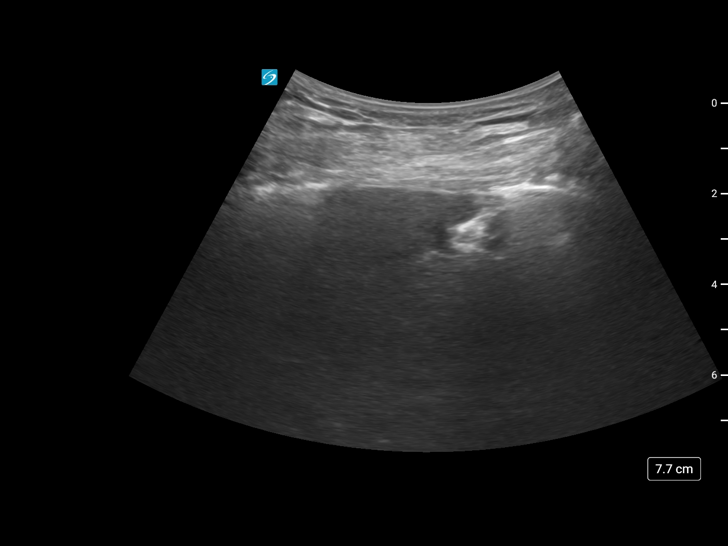
[im 10/11]
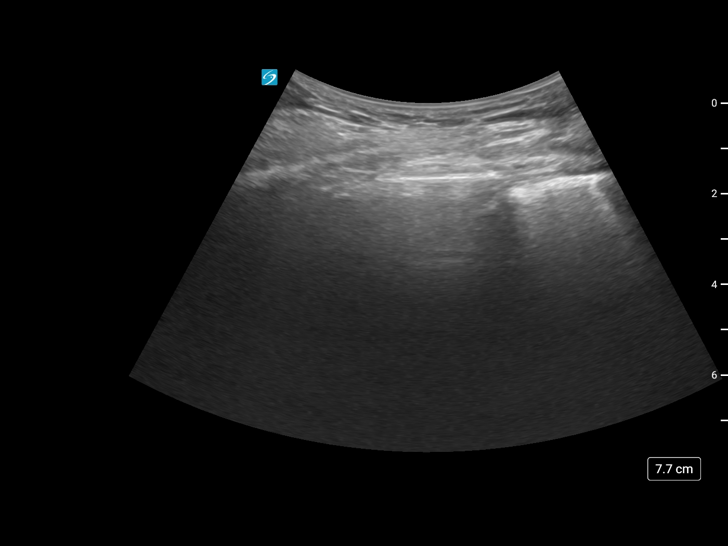
[im 11/11]
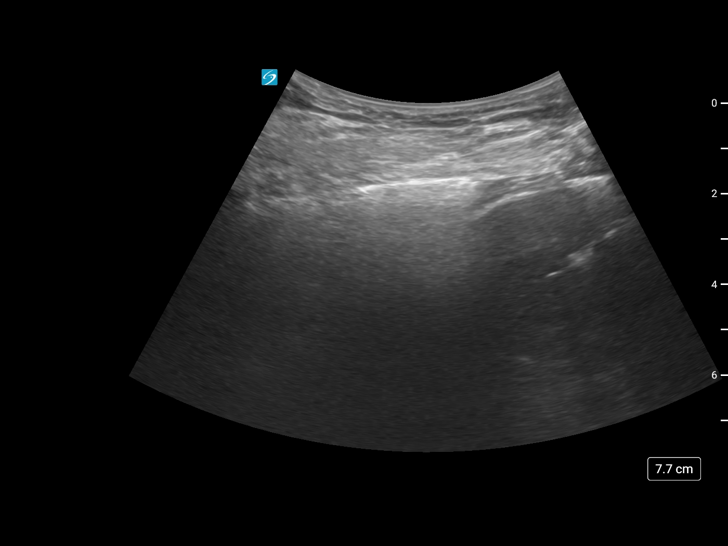

[11 of 11 positions shown; findings below may reference images not displayed]

MEDICATIONS:
None

ANESTHESIA/SEDATION:
Fentanyl 75 mcg IV; Versed 1.5 mg IV

Total Moderate Sedation time:  17 Minutes.

The patient's level of consciousness and vital signs were monitored
continuously by radiology nursing throughout the procedure under my
direct supervision.

COMPLICATIONS:
None immediate.

PROCEDURE:
Informed written consent was obtained from the patient after a
discussion of the risks, benefits and alternatives to treatment. The
patient understands and consents the procedure. A timeout was
performed prior to the initiation of the procedure.

Ultrasound scanning was performed of the right upper abdominal
quadrant demonstrates an approximately 2.6 x 2.5 cm hypoattenuating
lesion within the dome of the right lobe of the liver correlating
with the lesion seen on preceding abdominal CT image 14, series 3.
The lesion was selected for biopsy given lesion location and
sonographic window.

The right upper abdominal quadrant was prepped and draped in the
usual sterile fashion. The overlying soft tissues were anesthetized
with 1% lidocaine with epinephrine. A 17 gauge, 6.8 cm co-axial
needle was advanced into a peripheral aspect of the lesion. This was
followed by 4 core biopsies with an 18 gauge core device under
direct ultrasound guidance.

The coaxial needle tract was embolized with a small amount of
Gel-Foam slurry and superficial hemostasis was obtained with manual
compression. Post procedural scanning was negative for definitive
area of hemorrhage or additional complication. A dressing was
placed. The patient tolerated the procedure well without immediate
post procedural complication.
IMPRESSION: Technically successful ultrasound guided core needle biopsy of
indeterminate lesion within the dome of the right lobe of the liver.

## 2022-12-06 NOTE — Telephone Encounter (Signed)
Telephone call  

## 2022-12-14 IMAGING — CT CT ABD-PELV W/ CM
2 of 5 series · 15 of 46 positions shown, 17 images · IV contrast (omnipaque)
Comparison: 01/13/2020

CLINICAL DATA: Pancreatic cancer, liver metastases, assess
treatment response

EXAM:
CT ABDOMEN AND PELVIS WITH CONTRAST
TECHNIQUE: Multidetector CT imaging of the abdomen and pelvis was performed
using the standard protocol following bolus administration of
intravenous contrast.
CONTRAST:  100mL OMNIPAQUE IOHEXOL 300 MG/ML SOLN, additional oral
enteric contrast

[Series 2: axial st · axial · 0.62mm/px · z∈[-414,-89]mm · 12 of 75 slices shown, 14 images]
[im 5/75  soft-tissue]
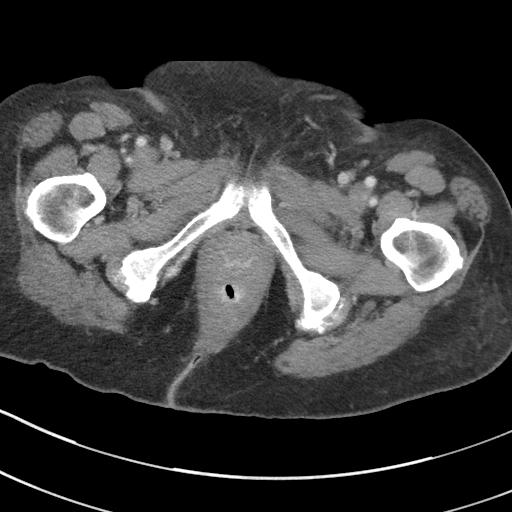
[im 5/75  bone]
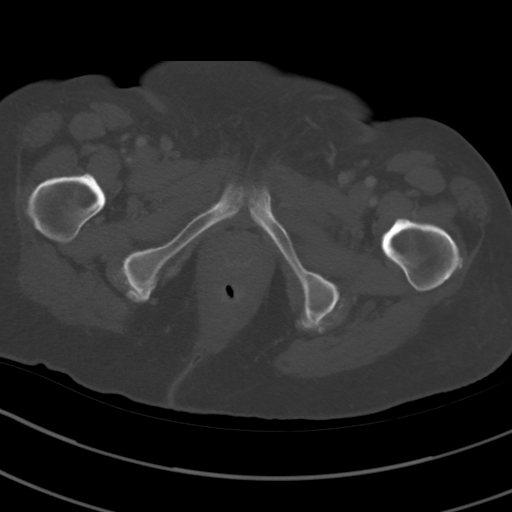
[im 10/75  soft-tissue]
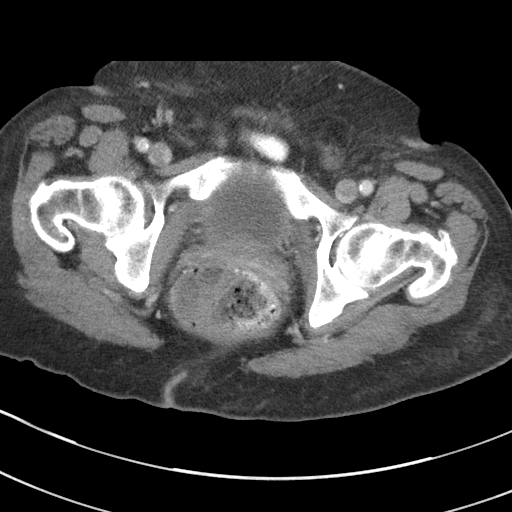
[im 19/75  soft-tissue]
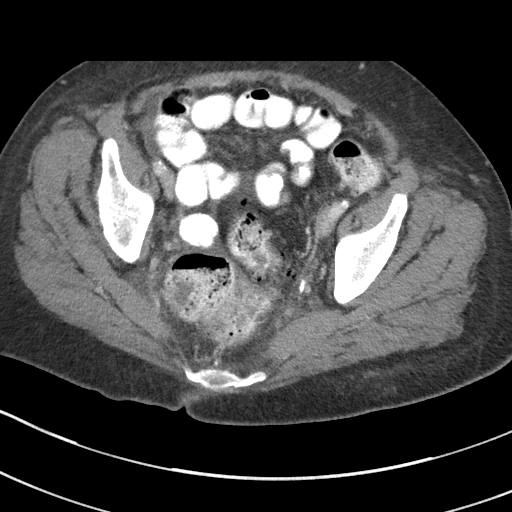
[im 24/75  soft-tissue]
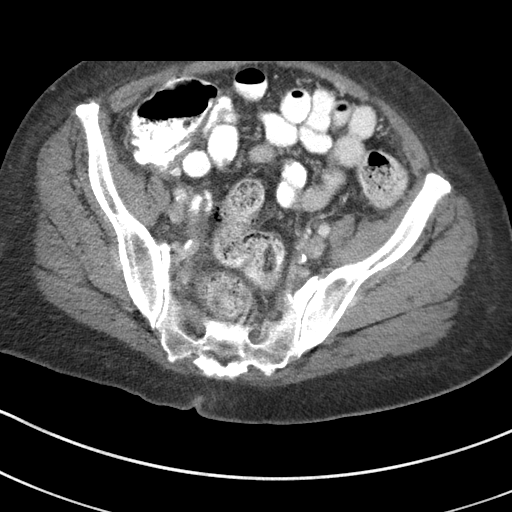
[im 28/75  soft-tissue]
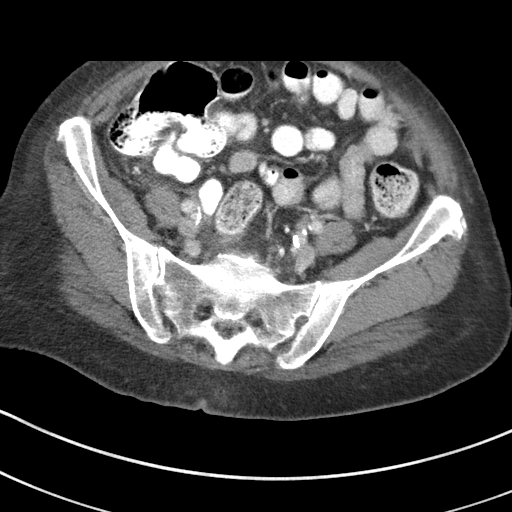
[im 33/75  soft-tissue]
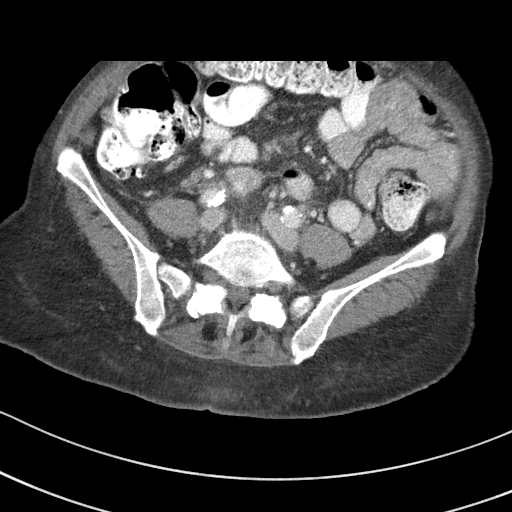
[im 42/75  soft-tissue]
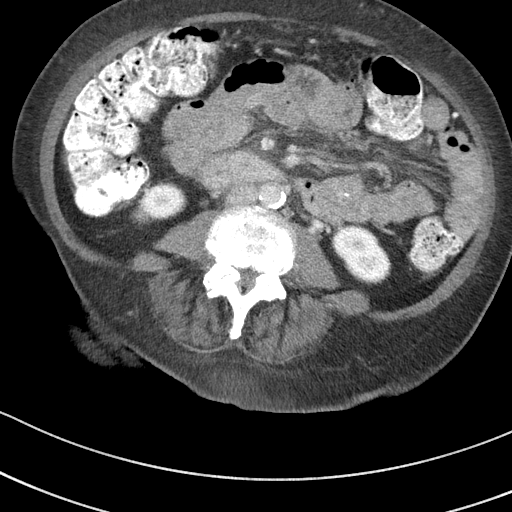
[im 47/75  soft-tissue]
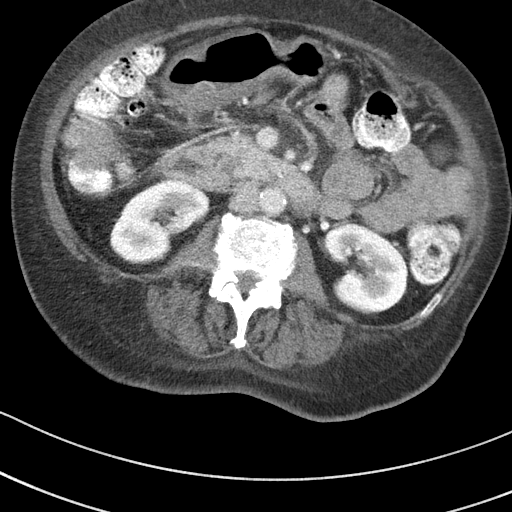
[im 51/75  soft-tissue]
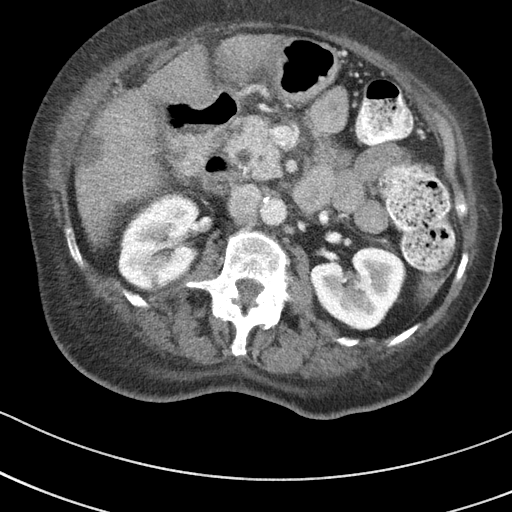
[im 51/75  bone]
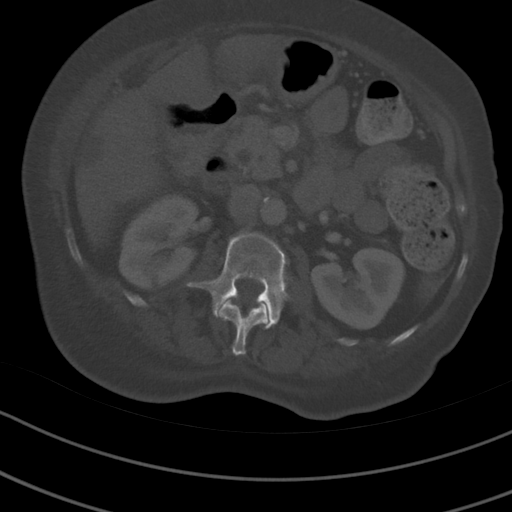
[im 56/75  soft-tissue]
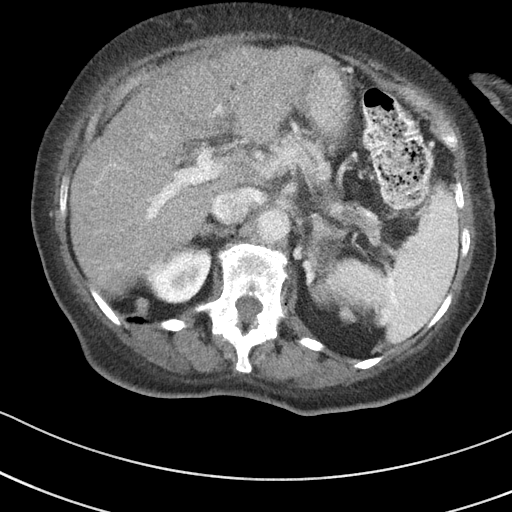
[im 65/75  soft-tissue]
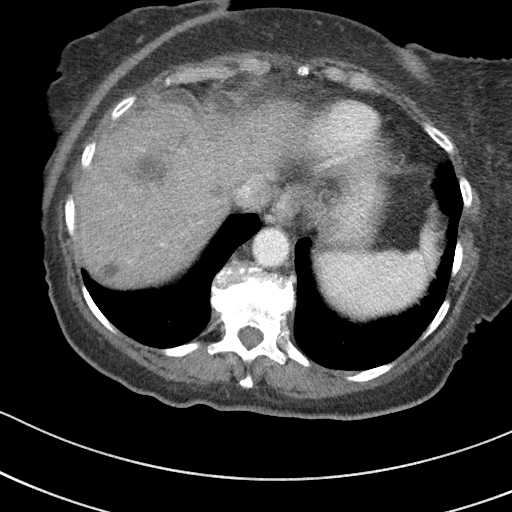
[im 70/75  soft-tissue]
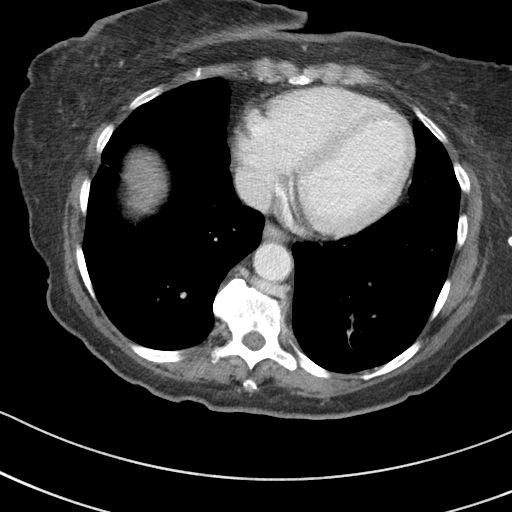

[Series 5: coronal st · coronal · 0.69mm/px · 3 of 95 slices shown]
[im 32/95  soft-tissue]
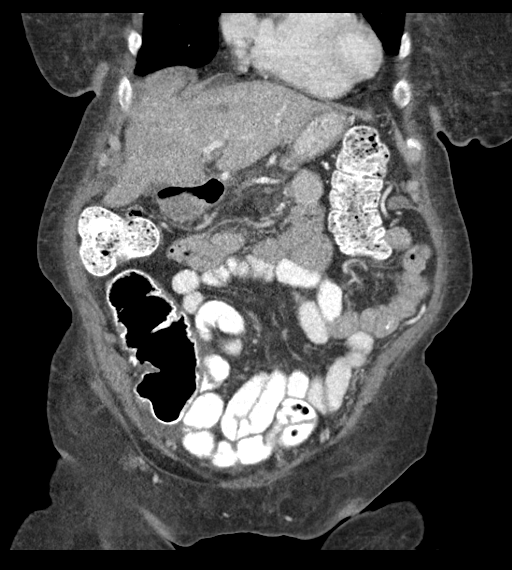
[im 42/95  soft-tissue]
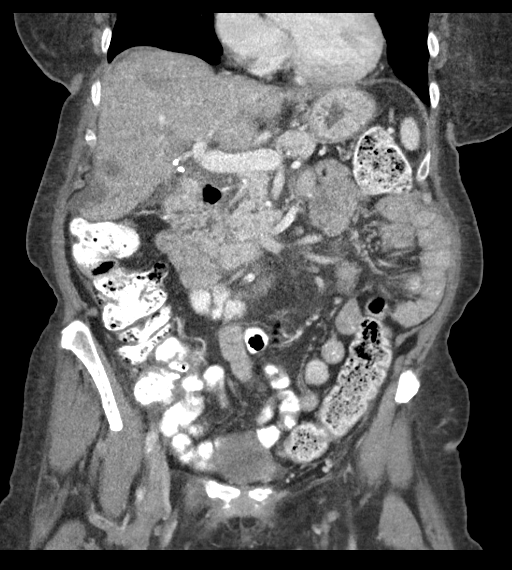
[im 53/95  soft-tissue]
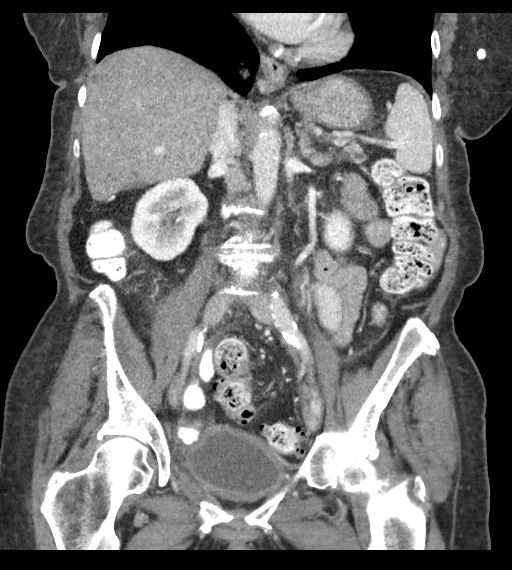

[15 of 46 positions shown; findings below may reference images not displayed]

FINDINGS: Lower chest: No acute abnormality. Bandlike scarring of the
bilateral lung bases.

Hepatobiliary: Numerous low-attenuation liver lesions are seen,
increased in size and number compared to prior examination. Largest
lesion of the anterior liver dome, hepatic segment VII, measures
x 2.5 cm, previously 2.1 x 2.1 cm when measured similarly (series 2,
image 10). Index lesion of the anterior subcapsular tip of the right
lobe of the liver, hepatic segment VI, measures 2.3 x 1.4 cm,
previously 1.7 x 1.0 cm when measured similarly (series 2, image
26). Status post cholecystectomy. Mild postoperative biliary ductal
dilatation.

Pancreas: Interval decrease in size of a hypodense mass of the
central pancreatic tail, measuring approximately 1.5 x 1.2 cm,
previously 2.0 x 1.8 cm when measured similarly (series 2, image
20). No pancreatic ductal dilatation or surrounding inflammatory
changes.

Spleen: Normal in size without significant abnormality.

Adrenals/Urinary Tract: Stable adenoma of the left adrenal gland
measuring 3.3 x 2.4 cm (series 2, image 18). Kidneys are normal,
without renal calculi, solid lesion, or hydronephrosis. Bladder is
unremarkable.

Stomach/Bowel: Stomach is within normal limits. Diverticulum of the
descending duodenum. Appendix is not clearly visualized. No evidence
of bowel wall thickening, distention, or inflammatory changes.
Sigmoid diverticulosis. Large stool balls in the rectum.

Vascular/Lymphatic: Aortic atherosclerosis. No enlarged abdominal or
pelvic lymph nodes.

Reproductive: Status post hysterectomy.

Other: No abdominal wall hernia or abnormality. No abdominopelvic
ascites. A previously queried omental nodule adjacent to the
transverse colon is more clearly a prominent diverticulum on current
examination (series 2, image 38).

Musculoskeletal: No acute or significant osseous findings.
IMPRESSION: 1. Interval decrease in size of a hypodense mass of the central
pancreatic tail, consistent with treatment response of primary
pancreatic malignancy.
2. Numerous low-attenuation liver lesions are seen, increased in
size and number compared to prior examination. Findings are
consistent with worsened hepatic metastatic disease.
3. A previously queried omental nodule adjacent to the transverse
colon is more clearly a prominent diverticulum on current
examination.

Aortic Atherosclerosis (F7HKN-CSS.S).

## 2023-04-06 IMAGING — CT CT CHEST-ABD-PELV W/ CM
3 of 5 series · 14 of 36 positions shown, 16 images · IV contrast (APPLIED)
Comparison: AP CT on 05/03/2020, and chest CT on 02/11/2020

CLINICAL DATA: Follow-up metastatic pancreatic carcinoma. Status
post chemotherapy. Restaging.

EXAM:
CT CHEST, ABDOMEN, AND PELVIS WITH CONTRAST
TECHNIQUE: Multidetector CT imaging of the chest, abdomen and pelvis was
performed following the standard protocol during bolus
administration of intravenous contrast.
CONTRAST:  80mL OMNIPAQUE IOHEXOL 350 MG/ML SOLN

[Series 2: cap with · axial · 0.71mm/px · z∈[+1156,+1591]mm · 9 of 109 slices shown, 11 images]
[im 11/109  mediastinal]
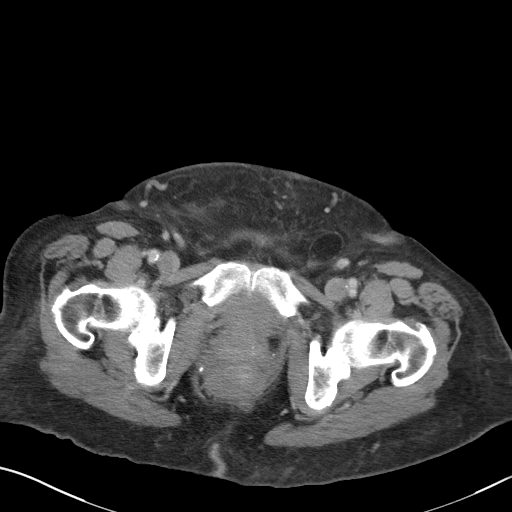
[im 11/109  bone]
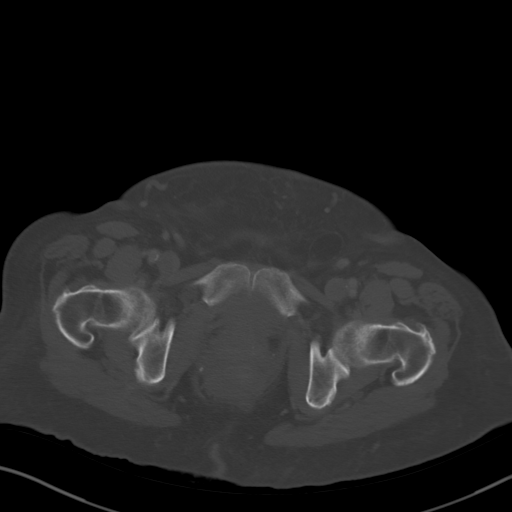
[im 22/109  mediastinal]
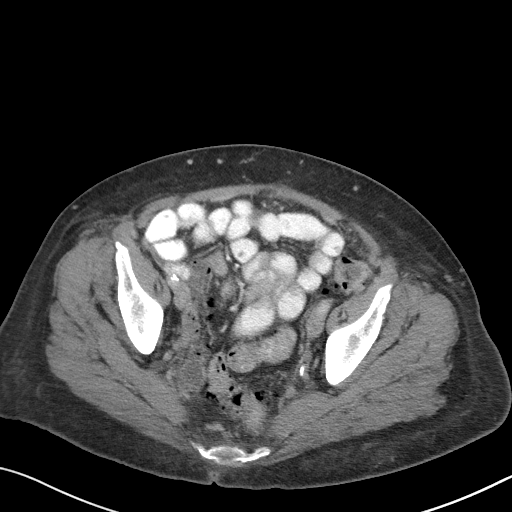
[im 33/109  mediastinal]
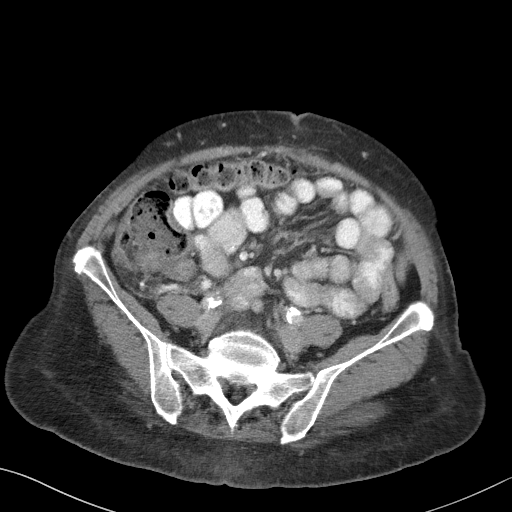
[im 44/109  mediastinal]
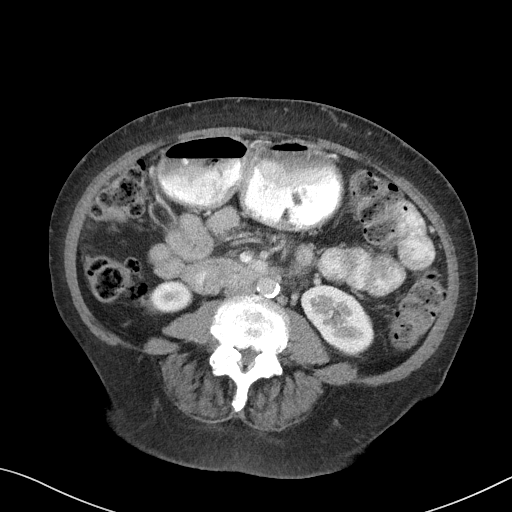
[im 55/109  mediastinal]
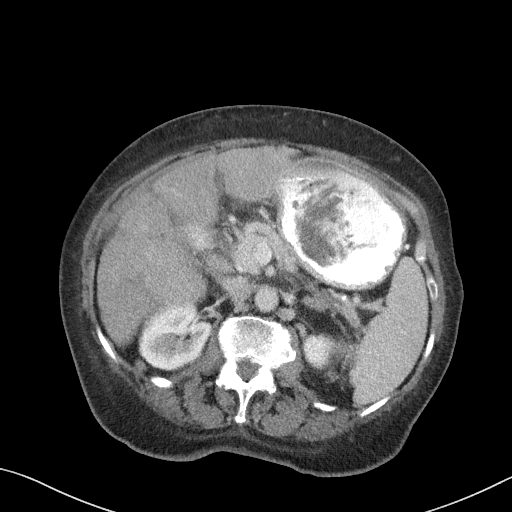
[im 65/109  mediastinal]
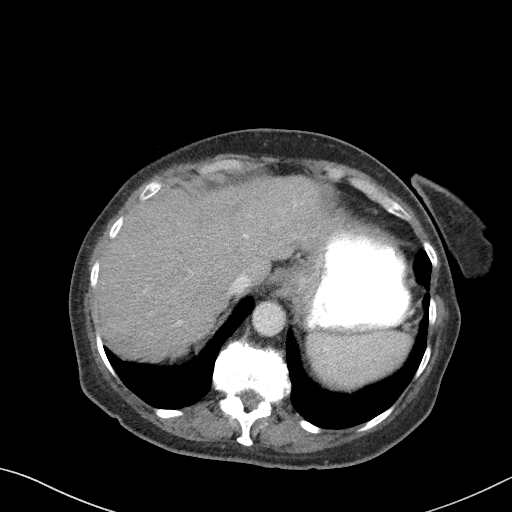
[im 76/109  mediastinal]
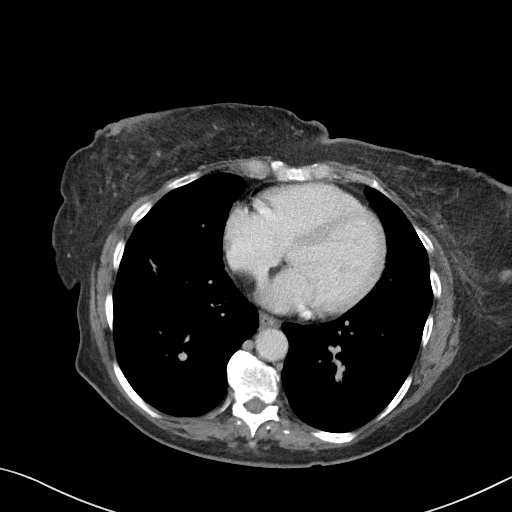
[im 87/109  mediastinal]
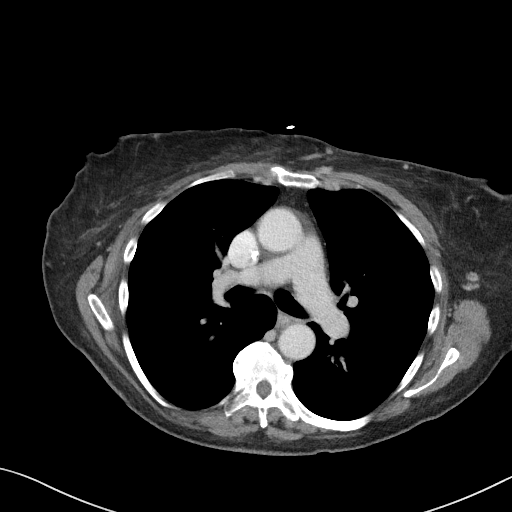
[im 98/109  mediastinal]
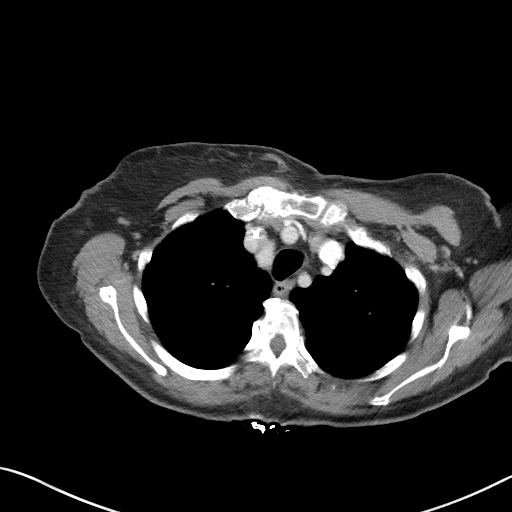
[im 98/109  bone]
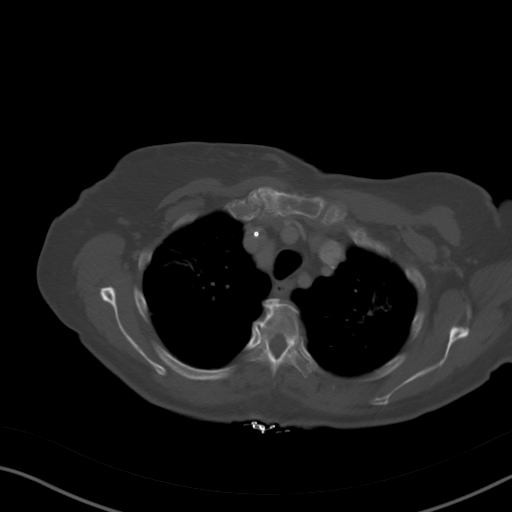

[Series 4: lung · axial · 0.71mm/px · z∈[+1404,+1448]mm · 2 of 133 slices shown]
[im 12/133  bone]
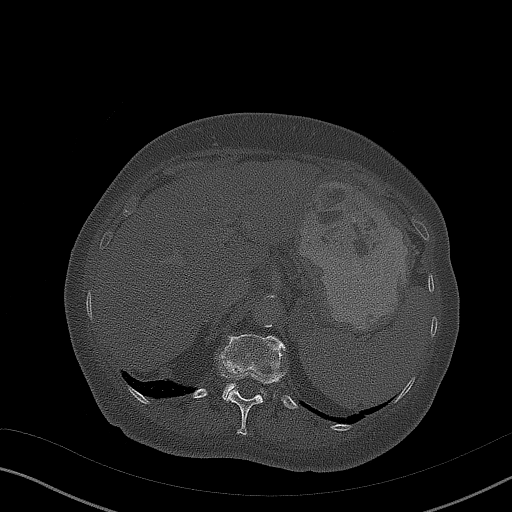
[im 34/133  bone]
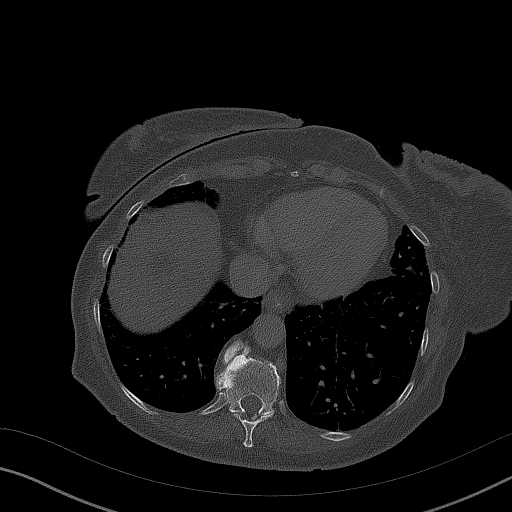

[Series 5: coronals · coronal · 0.82mm/px · 3 of 134 slices shown]
[im 27/134  mediastinal]
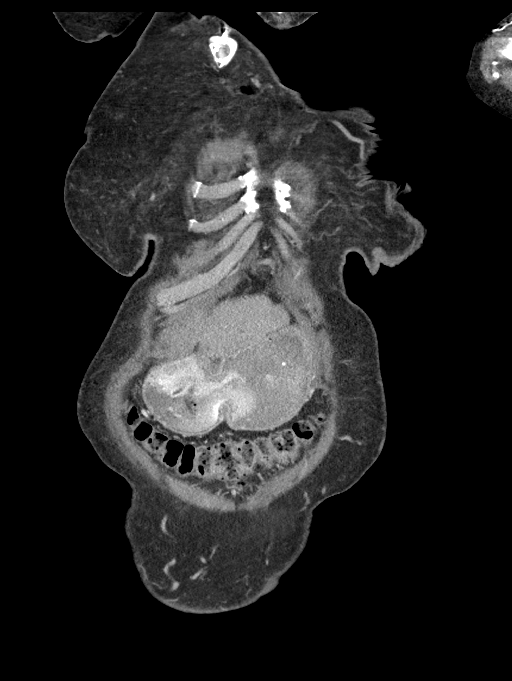
[im 54/134  mediastinal]
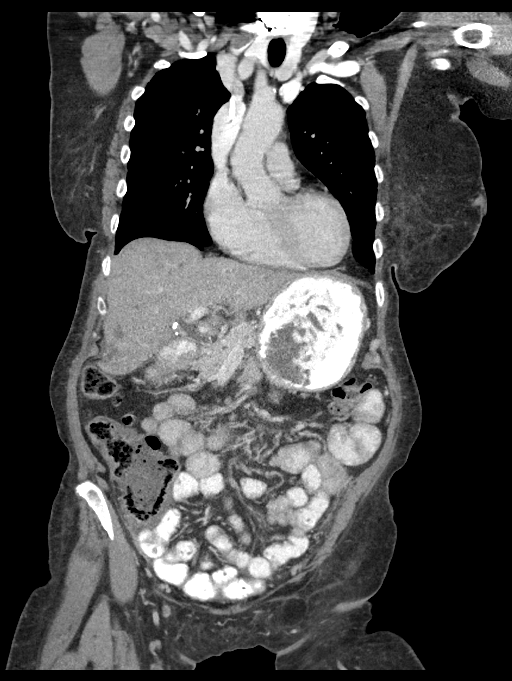
[im 80/134  mediastinal]
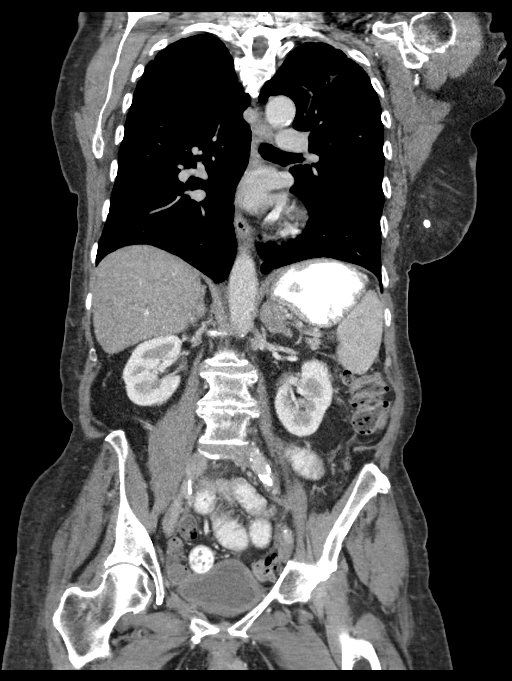

[14 of 36 positions shown; findings below may reference images not displayed]

FINDINGS: CT CHEST FINDINGS

Cardiovascular: A right jugular Port-A-Cath is seen in appropriate
position, however thrombosis of the right internal jugular vein is
now seen surrounding the catheter. There is no evidence of extension
into the SVC.

Mediastinum/Lymph Nodes: No masses or pathologically enlarged lymph
nodes identified.

Lungs/Pleura: Mild emphysema and bilateral pleural-parenchymal
scarring is again seen. 2 mm pulmonary nodule in the lingula on
image 62/4 remains stable. Previously seen tiny nodule in the
posterolateral left lower lobe is no longer visualized. No new or
enlarging pulmonary nodules or masses are identified. No evidence of
pulmonary infiltrate or pleural effusion.

Musculoskeletal:  No suspicious bone lesions identified.

CT ABDOMEN AND PELVIS FINDINGS

Hepatobiliary: Hepatic cirrhosis again noted. Multiple small
hypovascular liver masses are again seen but show mild decrease in
size since previous study. Index lesion in the dome of the right
hepatic lobe measures 2.1 x 1.9 cm on image 41/2, compared to 2.5 x
2.5 cm previously. Another index lesion in the inferior right
hepatic lobe measures 1.7 x 1.3 cm on image 57/2, decreased from
x 1.4 cm previously. No new or enlarging liver masses identified.
Prior cholecystectomy. No evidence of biliary obstruction.

Pancreas: No residual mass is seen at the junction of the pancreatic
body and tail. Atrophy and ductal dilatation is again seen in the
pancreatic tail.

Spleen:  Within normal limits in size and appearance.

Adrenals/Urinary tract: 3.2 x 2.4 cm left adrenal mass remains
stable. Right adrenal gland is normal in appearance. Tiny sub-cm
right renal cyst again noted. No masses or hydronephrosis. Mild
diffuse bladder wall thickening is again noted.

Stomach/Bowel: No evidence of obstruction, inflammatory process, or
abnormal fluid collections. Diverticulosis is seen mainly involving
the sigmoid colon, however there is no evidence of diverticulitis.

Vascular/Lymphatic: No pathologically enlarged lymph nodes
identified. No acute vascular findings. Aortic atherosclerotic
calcification noted.

Reproductive: Prior hysterectomy noted. Adnexal regions are
unremarkable in appearance.

Other:  None.

Musculoskeletal:  No suspicious bone lesions identified.
IMPRESSION: No new or progressive metastatic disease within the abdomen or
pelvis.

Mild decrease in hepatic metastatic disease.

Stricture at junction of the pancreatic body and tail, without
residual soft tissue mass.

Stable left adrenal mass.

Colonic diverticulosis, without radiographic evidence of
diverticulitis.

Stable mild diffuse bladder wall thickening, which may be due to
chronic cystitis or chronic bladder outlet obstruction.

Thrombosis of right internal jugular vein surrounding the right
jugular Port-A-Cath. These results will be called to the ordering
clinician or representative by the Radiologist Assistant, and
communication documented in the PACS or [REDACTED].

Aortic Atherosclerosis (KIN2F-YUX.X) and Emphysema ([id number].K).
